# Patient Record
Sex: Female | Born: 1935 | Race: White | Hispanic: No | Marital: Married | Smoking: Never smoker
Health system: Southern US, Community
[De-identification: ages and names within clinical notes are randomized; demographics above are authoritative.]

## PROBLEM LIST (undated history)

## (undated) DIAGNOSIS — I82409 Acute embolism and thrombosis of unspecified deep veins of unspecified lower extremity: Secondary | ICD-10-CM

## (undated) DIAGNOSIS — E7800 Pure hypercholesterolemia, unspecified: Secondary | ICD-10-CM

## (undated) DIAGNOSIS — F329 Major depressive disorder, single episode, unspecified: Secondary | ICD-10-CM

## (undated) DIAGNOSIS — C439 Malignant melanoma of skin, unspecified: Secondary | ICD-10-CM

## (undated) DIAGNOSIS — C50919 Malignant neoplasm of unspecified site of unspecified female breast: Secondary | ICD-10-CM

## (undated) DIAGNOSIS — M4800 Spinal stenosis, site unspecified: Secondary | ICD-10-CM

## (undated) DIAGNOSIS — I10 Essential (primary) hypertension: Secondary | ICD-10-CM

## (undated) DIAGNOSIS — T4145XA Adverse effect of unspecified anesthetic, initial encounter: Secondary | ICD-10-CM

## (undated) DIAGNOSIS — M810 Age-related osteoporosis without current pathological fracture: Secondary | ICD-10-CM

## (undated) DIAGNOSIS — F1021 Alcohol dependence, in remission: Secondary | ICD-10-CM

## (undated) DIAGNOSIS — I739 Peripheral vascular disease, unspecified: Secondary | ICD-10-CM

## (undated) DIAGNOSIS — M1990 Unspecified osteoarthritis, unspecified site: Secondary | ICD-10-CM

## (undated) DIAGNOSIS — C4492 Squamous cell carcinoma of skin, unspecified: Secondary | ICD-10-CM

## (undated) DIAGNOSIS — Z923 Personal history of irradiation: Secondary | ICD-10-CM

## (undated) HISTORY — PX: VEIN LIGATION: SHX2652

## (undated) HISTORY — PX: TONSILLECTOMY AND ADENOIDECTOMY: SUR1326

## (undated) HISTORY — DX: Alcohol dependence, in remission: F10.21

## (undated) HISTORY — PX: KYPHOPLASTY: SHX5884

## (undated) HISTORY — PX: OTHER SURGICAL HISTORY: SHX169

---
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
## 1979-03-26 HISTORY — PX: POSTERIOR LAMINECTOMY / DECOMPRESSION LUMBAR SPINE: SUR740

## 1991-07-26 DIAGNOSIS — Z923 Personal history of irradiation: Secondary | ICD-10-CM

## 1991-07-26 HISTORY — DX: Personal history of irradiation: Z92.3

## 1994-07-25 DIAGNOSIS — C50919 Malignant neoplasm of unspecified site of unspecified female breast: Secondary | ICD-10-CM

## 1994-07-25 HISTORY — PX: BREAST LUMPECTOMY: SHX2

## 1994-07-25 HISTORY — DX: Malignant neoplasm of unspecified site of unspecified female breast: C50.919

## 1998-09-15 ENCOUNTER — Ambulatory Visit (HOSPITAL_COMMUNITY): Admission: RE | Admit: 1998-09-15 | Discharge: 1998-09-15 | Payer: Self-pay | Admitting: Internal Medicine

## 1999-12-03 ENCOUNTER — Other Ambulatory Visit: Admission: RE | Admit: 1999-12-03 | Discharge: 1999-12-03 | Payer: Self-pay | Admitting: Internal Medicine

## 2000-12-06 ENCOUNTER — Other Ambulatory Visit: Admission: RE | Admit: 2000-12-06 | Discharge: 2000-12-06 | Payer: Self-pay | Admitting: Internal Medicine

## 2001-09-30 ENCOUNTER — Emergency Department (HOSPITAL_COMMUNITY): Admission: EM | Admit: 2001-09-30 | Discharge: 2001-09-30 | Payer: Self-pay | Admitting: Emergency Medicine

## 2001-09-30 ENCOUNTER — Encounter: Payer: Self-pay | Admitting: Emergency Medicine

## 2001-10-05 ENCOUNTER — Emergency Department (HOSPITAL_COMMUNITY): Admission: EM | Admit: 2001-10-05 | Discharge: 2001-10-05 | Payer: Self-pay | Admitting: Emergency Medicine

## 2002-08-22 ENCOUNTER — Ambulatory Visit (HOSPITAL_COMMUNITY): Admission: RE | Admit: 2002-08-22 | Discharge: 2002-08-22 | Payer: Self-pay | Admitting: Internal Medicine

## 2003-01-06 ENCOUNTER — Encounter: Admission: RE | Admit: 2003-01-06 | Discharge: 2003-01-06 | Payer: Self-pay | Admitting: Surgery

## 2003-01-06 ENCOUNTER — Encounter: Payer: Self-pay | Admitting: Surgery

## 2004-01-13 ENCOUNTER — Encounter: Admission: RE | Admit: 2004-01-13 | Discharge: 2004-01-13 | Payer: Self-pay | Admitting: Surgery

## 2004-01-15 DIAGNOSIS — C4492 Squamous cell carcinoma of skin, unspecified: Secondary | ICD-10-CM

## 2004-01-15 DIAGNOSIS — C439 Malignant melanoma of skin, unspecified: Secondary | ICD-10-CM

## 2004-01-15 HISTORY — DX: Squamous cell carcinoma of skin, unspecified: C44.92

## 2004-01-15 HISTORY — DX: Malignant melanoma of skin, unspecified: C43.9

## 2005-01-27 ENCOUNTER — Encounter: Admission: RE | Admit: 2005-01-27 | Discharge: 2005-01-27 | Payer: Self-pay | Admitting: Surgery

## 2005-07-15 ENCOUNTER — Inpatient Hospital Stay (HOSPITAL_COMMUNITY): Admission: EM | Admit: 2005-07-15 | Discharge: 2005-07-16 | Payer: Self-pay | Admitting: Emergency Medicine

## 2006-02-07 ENCOUNTER — Encounter: Admission: RE | Admit: 2006-02-07 | Discharge: 2006-02-07 | Payer: Self-pay | Admitting: Surgery

## 2007-02-13 ENCOUNTER — Encounter: Admission: RE | Admit: 2007-02-13 | Discharge: 2007-02-13 | Payer: Self-pay | Admitting: Surgery

## 2007-09-25 ENCOUNTER — Ambulatory Visit: Payer: Self-pay | Admitting: *Deleted

## 2007-10-17 ENCOUNTER — Ambulatory Visit: Payer: Self-pay | Admitting: Internal Medicine

## 2007-12-18 ENCOUNTER — Ambulatory Visit: Payer: Self-pay | Admitting: Internal Medicine

## 2008-01-01 ENCOUNTER — Ambulatory Visit: Payer: Self-pay | Admitting: Internal Medicine

## 2008-02-14 ENCOUNTER — Encounter: Admission: RE | Admit: 2008-02-14 | Discharge: 2008-02-14 | Payer: Self-pay | Admitting: Internal Medicine

## 2008-04-03 ENCOUNTER — Ambulatory Visit (HOSPITAL_COMMUNITY): Admission: RE | Admit: 2008-04-03 | Discharge: 2008-04-03 | Payer: Self-pay | Admitting: Orthopedic Surgery

## 2008-04-10 ENCOUNTER — Ambulatory Visit (HOSPITAL_COMMUNITY): Admission: RE | Admit: 2008-04-10 | Discharge: 2008-04-10 | Payer: Self-pay | Admitting: Orthopedic Surgery

## 2009-02-23 ENCOUNTER — Encounter: Admission: RE | Admit: 2009-02-23 | Discharge: 2009-02-23 | Payer: Self-pay | Admitting: Internal Medicine

## 2010-03-10 ENCOUNTER — Encounter: Admission: RE | Admit: 2010-03-10 | Discharge: 2010-03-10 | Payer: Self-pay | Admitting: Internal Medicine

## 2010-07-20 ENCOUNTER — Inpatient Hospital Stay (HOSPITAL_COMMUNITY)
Admission: EM | Admit: 2010-07-20 | Discharge: 2010-07-22 | Payer: Self-pay | Source: Home / Self Care | Attending: Internal Medicine | Admitting: Internal Medicine

## 2010-08-15 ENCOUNTER — Encounter: Payer: Self-pay | Admitting: Internal Medicine

## 2010-10-04 LAB — CBC
Hemoglobin: 10.4 g/dL — ABNORMAL LOW (ref 12.0–15.0)
MCH: 31.5 pg (ref 26.0–34.0)
MCHC: 33.7 g/dL (ref 30.0–36.0)
MCV: 94 fL (ref 78.0–100.0)
MCV: 94.4 fL (ref 78.0–100.0)
Platelets: 160 10*3/uL (ref 150–400)
Platelets: 169 10*3/uL (ref 150–400)
Platelets: 201 10*3/uL (ref 150–400)
RBC: 4.33 MIL/uL (ref 3.87–5.11)
RDW: 13.4 % (ref 11.5–15.5)
RDW: 13.4 % (ref 11.5–15.5)
WBC: 11.7 10*3/uL — ABNORMAL HIGH (ref 4.0–10.5)
WBC: 6.3 10*3/uL (ref 4.0–10.5)

## 2010-10-04 LAB — TYPE AND SCREEN
ABO/RH(D): A POS
Antibody Screen: NEGATIVE

## 2010-10-04 LAB — BASIC METABOLIC PANEL
BUN: 20 mg/dL (ref 6–23)
CO2: 30 mEq/L (ref 19–32)
Calcium: 8.4 mg/dL (ref 8.4–10.5)
Chloride: 102 mEq/L (ref 96–112)
Creatinine, Ser: 0.59 mg/dL (ref 0.4–1.2)
Creatinine, Ser: 0.66 mg/dL (ref 0.4–1.2)
GFR calc Af Amer: >60 mL/min (ref >60)
Glucose, Bld: 152 mg/dL — ABNORMAL HIGH (ref 70–99)

## 2010-10-04 LAB — DIFFERENTIAL
Lymphocytes Relative: 15 % (ref 12–46)
Lymphs Abs: 1.7 10*3/uL (ref 0.7–4.0)
Neutrophils Relative %: 81 % — ABNORMAL HIGH (ref 43–77)

## 2010-10-04 LAB — URINALYSIS, ROUTINE W REFLEX MICROSCOPIC
Bilirubin Urine: NEGATIVE
Hgb urine dipstick: NEGATIVE
Ketones, ur: NEGATIVE mg/dL
Protein, ur: NEGATIVE mg/dL
Urobilinogen, UA: 0.2 mg/dL (ref 0.0–1.0)

## 2010-10-04 LAB — APTT: aPTT: 28 seconds (ref 24–37)

## 2010-10-04 LAB — PROTIME-INR
INR: 0.93 (ref 0.00–1.49)
Prothrombin Time: 12.7 seconds (ref 11.6–15.2)
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
## 2010-12-07 NOTE — Procedures (Signed)
DUPLEX DEEP VENOUS EXAM - LOWER EXTREMITY   INDICATION:  Right leg swelling and redness.   HISTORY:  Edema:  Right.  Trauma/Surgery:  Patient states that she bumped her right leg  approximately one month ago.  Pain:  Reddened and painful on the right.  PE:  No.  Previous DVT:  No.  Anticoagulants:  No.  Other:   DUPLEX EXAM:                CFV   SFV   PopV  PTV    GSV                R  L  R  L  R  L  R   L  R  L  Thrombosis    o  o  o     o     o  Spontaneous   +  +  +     +     +  Phasic        +  +  +           +  Augmentation  +  +  +     +     +  Compressible  +  +  +     +     +  Competent     +  +  +     +     +   Legend:  + - yes  o - no  p - partial  D - decreased   IMPRESSION:  1. Right posterior tibial artery signal is within normal limits.  2. No evidence of right lower extremity deep venous thrombosis.  3. Right short saphenous vein is patent.   A preliminary copy was faxed to Dr. Lanell Matar office.    _____________________________  P. Liliane Bade, M.D.   DP/MEDQ  D:  09/25/2007  T:  09/25/2007  Job:  [id number]

## 2010-12-07 NOTE — Consult Note (Signed)
NAME:  [name], [name]                 ACCOUNT NO.:  [id number]   MEDICAL RECORD NO.:  [id number]          PATIENT TYPE:  OUT   LOCATION:  MRI                          FACILITY:  [facility]   PHYSICIAN:  Dr. Corliss Skains          DATE OF BIRTH:  Mar 28, 1932   DATE OF CONSULTATION:  DATE OF DISCHARGE:                                 CONSULTATION   ADDENDUM   Due to the severity of the patient's fracture and retropulsion  associated with this injury, Dr. Corliss Skains has recommended a  vertebroplasty instead of a kyphoplasty.  His concern is that the  kyphoplasty procedure might cause further retropulsion of the fracture.  This was explained to the patient and her husband.      Delton See, P.A.    ______________________________  Dr. Corliss Skains    DR/MEDQ  D:  04/03/2008  T:  04/04/2008  Job:  [id number]   cc:   Mila Homer. Sherlean Foot, M.D.  Geoffry Paradise, MD
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
## 2010-12-22 ENCOUNTER — Other Ambulatory Visit (HOSPITAL_COMMUNITY): Payer: Self-pay | Admitting: Interventional Radiology

## 2010-12-22 DIAGNOSIS — M549 Dorsalgia, unspecified: Secondary | ICD-10-CM

## 2010-12-24 ENCOUNTER — Ambulatory Visit (HOSPITAL_COMMUNITY)
Admission: RE | Admit: 2010-12-24 | Discharge: 2010-12-24 | Disposition: A | Discharge status: Home / Self Care | Payer: Medicare Other | Source: Ambulatory Visit | Attending: Interventional Radiology | Admitting: Interventional Radiology

## 2010-12-24 DIAGNOSIS — M549 Dorsalgia, unspecified: Secondary | ICD-10-CM

## 2011-01-18 ENCOUNTER — Emergency Department (HOSPITAL_BASED_OUTPATIENT_CLINIC_OR_DEPARTMENT_OTHER)
Admission: EM | Admit: 2011-01-18 | Discharge: 2011-01-18 | Disposition: A | Discharge status: Home / Self Care | Payer: Medicare Other | Attending: Emergency Medicine | Admitting: Emergency Medicine

## 2011-01-18 ENCOUNTER — Emergency Department (INDEPENDENT_AMBULATORY_CARE_PROVIDER_SITE_OTHER): Payer: Medicare Other

## 2011-01-18 DIAGNOSIS — G319 Degenerative disease of nervous system, unspecified: Secondary | ICD-10-CM

## 2011-01-18 DIAGNOSIS — R51 Headache: Secondary | ICD-10-CM

## 2011-01-18 DIAGNOSIS — E7800 Pure hypercholesterolemia, unspecified: Secondary | ICD-10-CM | POA: Insufficient documentation

## 2011-01-18 DIAGNOSIS — M542 Cervicalgia: Secondary | ICD-10-CM

## 2011-01-18 DIAGNOSIS — Z853 Personal history of malignant neoplasm of breast: Secondary | ICD-10-CM | POA: Insufficient documentation

## 2011-01-18 DIAGNOSIS — Y92009 Unspecified place in unspecified non-institutional (private) residence as the place of occurrence of the external cause: Secondary | ICD-10-CM | POA: Insufficient documentation

## 2011-01-18 DIAGNOSIS — I10 Essential (primary) hypertension: Secondary | ICD-10-CM | POA: Insufficient documentation

## 2011-01-18 DIAGNOSIS — M5030 Other cervical disc degeneration, unspecified cervical region: Secondary | ICD-10-CM

## 2011-01-18 DIAGNOSIS — S0100XA Unspecified open wound of scalp, initial encounter: Secondary | ICD-10-CM | POA: Insufficient documentation

## 2011-01-18 DIAGNOSIS — W19XXXA Unspecified fall, initial encounter: Secondary | ICD-10-CM | POA: Insufficient documentation

## 2011-01-18 DIAGNOSIS — M47812 Spondylosis without myelopathy or radiculopathy, cervical region: Secondary | ICD-10-CM

## 2011-01-18 DIAGNOSIS — F1021 Alcohol dependence, in remission: Secondary | ICD-10-CM | POA: Insufficient documentation

## 2011-01-27 ENCOUNTER — Ambulatory Visit (HOSPITAL_COMMUNITY)
Admission: RE | Admit: 2011-01-27 | Discharge: 2011-01-27 | Disposition: A | Discharge status: Home / Self Care | Payer: Medicare Other | Source: Ambulatory Visit | Attending: Interventional Radiology | Admitting: Interventional Radiology

## 2011-01-27 ENCOUNTER — Other Ambulatory Visit (HOSPITAL_COMMUNITY): Payer: Self-pay | Admitting: Interventional Radiology

## 2011-01-27 DIAGNOSIS — M549 Dorsalgia, unspecified: Secondary | ICD-10-CM

## 2011-01-27 DIAGNOSIS — M4804 Spinal stenosis, thoracic region: Secondary | ICD-10-CM | POA: Insufficient documentation

## 2011-01-27 DIAGNOSIS — M5126 Other intervertebral disc displacement, lumbar region: Secondary | ICD-10-CM | POA: Insufficient documentation

## 2011-01-27 DIAGNOSIS — M545 Low back pain, unspecified: Secondary | ICD-10-CM | POA: Insufficient documentation

## 2011-01-27 DIAGNOSIS — M51379 Other intervertebral disc degeneration, lumbosacral region without mention of lumbar back pain or lower extremity pain: Secondary | ICD-10-CM | POA: Insufficient documentation

## 2011-01-27 DIAGNOSIS — M519 Unspecified thoracic, thoracolumbar and lumbosacral intervertebral disc disorder: Secondary | ICD-10-CM | POA: Insufficient documentation

## 2011-01-27 DIAGNOSIS — M5137 Other intervertebral disc degeneration, lumbosacral region: Secondary | ICD-10-CM | POA: Insufficient documentation

## 2011-01-27 DIAGNOSIS — M8448XA Pathological fracture, other site, initial encounter for fracture: Secondary | ICD-10-CM | POA: Insufficient documentation

## 2011-01-31 ENCOUNTER — Other Ambulatory Visit (HOSPITAL_COMMUNITY): Payer: Self-pay | Admitting: Interventional Radiology

## 2011-01-31 DIAGNOSIS — IMO0002 Reserved for concepts with insufficient information to code with codable children: Secondary | ICD-10-CM

## 2011-02-01 ENCOUNTER — Ambulatory Visit (HOSPITAL_COMMUNITY)
Admission: RE | Admit: 2011-02-01 | Discharge: 2011-02-01 | Disposition: A | Discharge status: Home / Self Care | Payer: Medicare Other | Source: Ambulatory Visit | Attending: Interventional Radiology | Admitting: Interventional Radiology

## 2011-02-01 ENCOUNTER — Other Ambulatory Visit (HOSPITAL_COMMUNITY): Payer: Self-pay | Admitting: Interventional Radiology

## 2011-02-01 ENCOUNTER — Other Ambulatory Visit: Payer: Self-pay | Admitting: Interventional Radiology

## 2011-02-01 DIAGNOSIS — IMO0002 Reserved for concepts with insufficient information to code with codable children: Secondary | ICD-10-CM

## 2011-02-01 DIAGNOSIS — M545 Low back pain, unspecified: Secondary | ICD-10-CM | POA: Insufficient documentation

## 2011-02-01 DIAGNOSIS — M8448XA Pathological fracture, other site, initial encounter for fracture: Secondary | ICD-10-CM | POA: Insufficient documentation

## 2011-02-01 LAB — BASIC METABOLIC PANEL
CO2: 28 mEq/L (ref 19–32)
Glucose, Bld: 101 mg/dL — ABNORMAL HIGH (ref 70–99)
Potassium: 3.7 mEq/L (ref 3.5–5.1)
Sodium: 140 mEq/L (ref 135–145)

## 2011-02-01 LAB — CBC
Hemoglobin: 13.7 g/dL (ref 12.0–15.0)
MCH: 31.4 pg (ref 26.0–34.0)
RBC: 4.37 MIL/uL (ref 3.87–5.11)

## 2011-02-01 LAB — PROTIME-INR: INR: 0.98 (ref 0.00–1.49)

## 2011-02-01 LAB — APTT: aPTT: 30 seconds (ref 24–37)

## 2011-02-01 LAB — POCT I-STAT 4, (NA,K, GLUC, HGB,HCT): Glucose, Bld: 104 mg/dL — ABNORMAL HIGH (ref 70–99)

## 2011-02-08 ENCOUNTER — Other Ambulatory Visit (HOSPITAL_COMMUNITY): Payer: Self-pay | Admitting: Interventional Radiology

## 2011-02-08 DIAGNOSIS — IMO0002 Reserved for concepts with insufficient information to code with codable children: Secondary | ICD-10-CM

## 2011-02-10 ENCOUNTER — Other Ambulatory Visit: Payer: Self-pay | Admitting: Internal Medicine

## 2011-02-10 DIAGNOSIS — Z853 Personal history of malignant neoplasm of breast: Secondary | ICD-10-CM

## 2011-02-15 ENCOUNTER — Ambulatory Visit (HOSPITAL_COMMUNITY)
Admission: RE | Admit: 2011-02-15 | Discharge: 2011-02-15 | Disposition: A | Discharge status: Home / Self Care | Payer: Medicare Other | Source: Ambulatory Visit | Attending: Interventional Radiology | Admitting: Interventional Radiology

## 2011-02-15 DIAGNOSIS — IMO0002 Reserved for concepts with insufficient information to code with codable children: Secondary | ICD-10-CM

## 2011-03-24 ENCOUNTER — Other Ambulatory Visit: Payer: Self-pay | Admitting: Internal Medicine

## 2011-03-24 ENCOUNTER — Ambulatory Visit
Admission: RE | Admit: 2011-03-24 | Discharge: 2011-03-24 | Disposition: A | Discharge status: Home / Self Care | Payer: Medicare Other | Source: Ambulatory Visit | Attending: Internal Medicine | Admitting: Internal Medicine

## 2011-03-24 DIAGNOSIS — Z853 Personal history of malignant neoplasm of breast: Secondary | ICD-10-CM

## 2011-04-25 LAB — PROTIME-INR
INR: 1
Prothrombin Time: 13.5

## 2011-04-25 LAB — CBC
HCT: 40.5
Hemoglobin: 13.4
MCHC: 33.1
RDW: 13

## 2011-04-25 LAB — BASIC METABOLIC PANEL
CO2: 31
Glucose, Bld: 89
Potassium: 3.5
Sodium: 138

## 2011-04-27 LAB — CREATININE, SERUM
Creatinine, Ser: 0.71
GFR calc non Af Amer: >60

## 2011-04-27 LAB — BUN: BUN: 21

## 2011-06-17 ENCOUNTER — Emergency Department (HOSPITAL_BASED_OUTPATIENT_CLINIC_OR_DEPARTMENT_OTHER)
Admission: EM | Admit: 2011-06-17 | Discharge: 2011-06-17 | Disposition: A | Discharge status: Home / Self Care | Payer: Medicare Other | Attending: Emergency Medicine | Admitting: Emergency Medicine

## 2011-06-17 ENCOUNTER — Emergency Department (INDEPENDENT_AMBULATORY_CARE_PROVIDER_SITE_OTHER): Payer: Medicare Other

## 2011-06-17 DIAGNOSIS — W19XXXA Unspecified fall, initial encounter: Secondary | ICD-10-CM

## 2011-06-17 DIAGNOSIS — M1120 Other chondrocalcinosis, unspecified site: Secondary | ICD-10-CM

## 2011-06-17 DIAGNOSIS — S8001XA Contusion of right knee, initial encounter: Secondary | ICD-10-CM

## 2011-06-17 DIAGNOSIS — E7800 Pure hypercholesterolemia, unspecified: Secondary | ICD-10-CM | POA: Insufficient documentation

## 2011-06-17 DIAGNOSIS — Z79899 Other long term (current) drug therapy: Secondary | ICD-10-CM | POA: Insufficient documentation

## 2011-06-17 DIAGNOSIS — Z853 Personal history of malignant neoplasm of breast: Secondary | ICD-10-CM | POA: Insufficient documentation

## 2011-06-17 DIAGNOSIS — I10 Essential (primary) hypertension: Secondary | ICD-10-CM | POA: Insufficient documentation

## 2011-06-17 DIAGNOSIS — M79609 Pain in unspecified limb: Secondary | ICD-10-CM

## 2011-06-17 DIAGNOSIS — S8000XA Contusion of unspecified knee, initial encounter: Secondary | ICD-10-CM | POA: Insufficient documentation

## 2011-06-17 HISTORY — DX: Essential (primary) hypertension: I10

## 2011-06-17 HISTORY — DX: Pure hypercholesterolemia, unspecified: E78.00

## 2011-06-17 HISTORY — DX: Malignant neoplasm of unspecified site of unspecified female breast: C50.919

## 2011-06-17 MED ORDER — IBUPROFEN 800 MG PO TABS
800.0000 mg | ORAL_TABLET | Freq: Once | ORAL | Status: AC
  Administered 2011-06-17: 800 mg via ORAL
  Filled 2011-06-17: qty 1

## 2011-06-17 MED ORDER — NAPROXEN 500 MG PO TABS: 500.0000 mg | ORAL_TABLET | Freq: Two times a day (BID) | ORAL | Status: DC
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
## 2011-06-17 NOTE — ED Notes (Signed)
Fell approx 4pm today-pain to right knee

## 2011-10-12 ENCOUNTER — Ambulatory Visit (INDEPENDENT_AMBULATORY_CARE_PROVIDER_SITE_OTHER): Payer: Medicare Other | Admitting: *Deleted

## 2011-10-12 DIAGNOSIS — M79609 Pain in unspecified limb: Secondary | ICD-10-CM

## 2011-10-12 DIAGNOSIS — M7989 Other specified soft tissue disorders: Secondary | ICD-10-CM

## 2011-10-19 NOTE — Procedures (Unsigned)
DUPLEX DEEP VENOUS EXAM - LOWER EXTREMITY  INDICATION:  Right lower extremity pain and swelling.  Bilateral vein ligation 40 years ago.  HISTORY:  Edema:  Yes. Trauma/Surgery:  No. Pain:  Yes. PE:  No. Previous DVT:  No. Anticoagulants:  No. Other:  Hemosiderin deposits of the right ankle.  DUPLEX EXAM:               CFV   SFV   PopV  PTV    GSV               R  L  R  L  R  L  R   L  R   L Thrombosis    0     0     0     0      NV. Spontaneous   +     +     +     + Phasic        +     +     +     + Augmentation  +     +     +     + Compressible  +     +     +     + Competent     0  0  0     +     0  Legend:  + - yes  o - no  p - partial  D - decreased  IMPRESSION:  No evidence of DVT in the right lower extremity.  Of note, the great saphenous vein has been surgically ligated, however, there are multiple varicose branches observed throughout the leg including prominent perforators of the ankle.  The right small saphenous vein is within normal limits.  There is deep venous reflux observed.   _____________________________ Quita Skye. Hart Rochester, M.D.  LT/MEDQ  D:  10/12/2011  T:  10/12/2011  Job:  [id number]

## 2012-04-10 ENCOUNTER — Other Ambulatory Visit: Payer: Self-pay | Admitting: Internal Medicine

## 2012-04-10 DIAGNOSIS — Z1231 Encounter for screening mammogram for malignant neoplasm of breast: Secondary | ICD-10-CM

## 2012-04-11 HISTORY — PX: MENISECTOMY: SHX5181

## 2012-04-14 ENCOUNTER — Emergency Department (HOSPITAL_COMMUNITY)
Admission: EM | Admit: 2012-04-14 | Discharge: 2012-04-14 | Disposition: A | Discharge status: Home / Self Care | Payer: Medicare Other | Attending: Emergency Medicine | Admitting: Emergency Medicine

## 2012-04-14 ENCOUNTER — Emergency Department (HOSPITAL_COMMUNITY): Payer: Medicare Other

## 2012-04-14 ENCOUNTER — Encounter (HOSPITAL_COMMUNITY): Payer: Self-pay | Admitting: *Deleted

## 2012-04-14 DIAGNOSIS — Z79899 Other long term (current) drug therapy: Secondary | ICD-10-CM | POA: Insufficient documentation

## 2012-04-14 DIAGNOSIS — I10 Essential (primary) hypertension: Secondary | ICD-10-CM | POA: Insufficient documentation

## 2012-04-14 DIAGNOSIS — M25569 Pain in unspecified knee: Secondary | ICD-10-CM | POA: Insufficient documentation

## 2012-04-14 DIAGNOSIS — M7989 Other specified soft tissue disorders: Secondary | ICD-10-CM

## 2012-04-14 DIAGNOSIS — M25561 Pain in right knee: Secondary | ICD-10-CM

## 2012-04-14 DIAGNOSIS — M79609 Pain in unspecified limb: Secondary | ICD-10-CM

## 2012-04-14 LAB — CBC WITH DIFFERENTIAL/PLATELET
Basophils Absolute: 0 10*3/uL (ref 0.0–0.1)
Basophils Relative: 0 % (ref 0–1)
Lymphocytes Relative: 8 % — ABNORMAL LOW (ref 12–46)
MCHC: 35 g/dL (ref 30.0–36.0)
Neutro Abs: 11.5 10*3/uL — ABNORMAL HIGH (ref 1.7–7.7)
Neutrophils Relative %: 83 % — ABNORMAL HIGH (ref 43–77)
RDW: 13.1 % (ref 11.5–15.5)
WBC: 13.9 10*3/uL — ABNORMAL HIGH (ref 4.0–10.5)

## 2012-04-14 LAB — BASIC METABOLIC PANEL
Chloride: 97 mEq/L (ref 96–112)
Creatinine, Ser: 0.71 mg/dL (ref 0.50–1.10)
GFR calc Af Amer: >90 mL/min (ref >90)
Potassium: 4 mEq/L (ref 3.5–5.1)
Sodium: 137 mEq/L (ref 135–145)

## 2012-04-14 MED ORDER — HYDROMORPHONE HCL PF 1 MG/ML IJ SOLN
0.5000 mg | Freq: Once | INTRAMUSCULAR | Status: AC
  Administered 2012-04-14: 0.5 mg via INTRAVENOUS
  Filled 2012-04-14 (×2): qty 1

## 2012-04-14 MED ORDER — HYDROMORPHONE HCL PF 1 MG/ML IJ SOLN
0.5000 mg | Freq: Once | INTRAMUSCULAR | Status: AC
  Administered 2012-04-14: 0.5 mg via INTRAMUSCULAR

## 2012-04-14 MED ORDER — ONDANSETRON HCL 4 MG/2ML IJ SOLN
4.0000 mg | Freq: Once | INTRAMUSCULAR | Status: AC
  Administered 2012-04-14: 4 mg via INTRAVENOUS
  Filled 2012-04-14: qty 2

## 2012-04-14 MED ORDER — SODIUM CHLORIDE 0.9 % IV BOLUS (SEPSIS)
500.0000 mL | Freq: Once | INTRAVENOUS | Status: AC
  Administered 2012-04-14: 500 mL via INTRAVENOUS

## 2012-04-14 MED ORDER — HYDROMORPHONE HCL PF 1 MG/ML IJ SOLN
1.0000 mg | Freq: Once | INTRAMUSCULAR | Status: AC
  Administered 2012-04-14: 1 mg via INTRAVENOUS
  Filled 2012-04-14: qty 1

## 2012-04-14 NOTE — ED Notes (Signed)
Pt. Is a recovering etoh; pt. Been taking Vicodin but afraid to overdose; sleeping through night. Today after lunch, she experienced severe pain from the back of leg to upper thigh. Called dr. Everlene Other of the pain. Pt. Been re-dressing the rt. Leg.? Dvt. Concerned about a blood clot.

## 2012-04-14 NOTE — ED Notes (Signed)
Pt for discharge.Vital signs stable.GCS 15.Pt still complaining of pain.

## 2012-04-14 NOTE — ED Notes (Signed)
Rt. Leg knee pain. Swollen in knee. Had surgery on pas wed. - ?.

## 2012-04-14 NOTE — ED Provider Notes (Signed)
History     CSN: [id number]  Arrival date & time 04/14/12  1647   First MD Initiated Contact with Patient 04/14/12 1805      Chief Complaint  Patient presents with  . Joint Swelling    (Consider location/radiation/quality/duration/timing/severity/associated sxs/prior treatment) HPI.... right knee pain tonight.  Status post right knee surgery past Wednesday by Dr. Dannielle Huh.   No fever, sweats, chills. Patient did walk and stand on her leg today.  Severity is moderate. Movement makes symptoms worse  Past Medical History  Diagnosis Date  . Breast cancer   . Hypertension   . High cholesterol   . Osteopetrosis     Past Surgical History  Procedure Date  . Breast lumpectomy     No family history on file.  History  Substance Use Topics  . Smoking status: Not on file  . Smokeless tobacco: Not on file  . Alcohol Use: No    OB History    Grav Para Term Preterm Abortions TAB SAB Ect Mult Living                  Review of Systems  All other systems reviewed and are negative.    Allergies  Codeine and Oysters  Home Medications   Current Outpatient Rx  Name Route Sig Dispense Refill  . CELECOXIB 200 MG PO CAPS Oral Take 200 mg by mouth 2 (two) times daily.    Marland Kitchen VITAMIN D3 2000 UNITS PO TABS Oral Take 2 tablets by mouth every morning.    Marland Kitchen ESCITALOPRAM OXALATE 10 MG PO TABS Oral Take 10 mg by mouth every morning.     Marland Kitchen ONE-DAILY MULTI VITAMINS PO TABS Oral Take 1 tablet by mouth every morning.     Marland Kitchen SIMVASTATIN 20 MG PO TABS Oral Take 20 mg by mouth every morning.     . TERIPARATIDE (RECOMBINANT) 600 MCG/2.4ML [place] SOLN Subcutaneous Inject 20 mcg into the skin daily.    . TRIAMTERENE-HCTZ 37.5-25 MG PO TABS Oral Take 1 tablet by mouth every morning.       BP 122/65  Pulse 79  Resp 16  SpO2 95%  Physical Exam  Nursing note and vitals reviewed. Constitutional: She is oriented to person, place, and time. She appears well-developed and well-nourished.  HENT:    Head: Normocephalic and atraumatic.  Eyes: Conjunctivae normal are normal.  Neck: Normal range of motion.  Abdominal: Soft. Bowel sounds are normal.  Musculoskeletal:       Right knee:  No pain with range of motion. Slight distal quadriceps edema.  No obvious joint effusion  Neurological: She is alert and oriented to person, place, and time.  Skin: Skin is warm and dry.  Psychiatric: She has a normal mood and affect.    ED Course  Procedures (including critical care time)  Labs Reviewed  CBC WITH DIFFERENTIAL - Abnormal; Notable for the following:    WBC 13.9 (*)     Neutrophils Relative 83 (*)     Neutro Abs 11.5 (*)     Lymphocytes Relative 8 (*)     Monocytes Absolute 1.2 (*)     All other components within normal limits  BASIC METABOLIC PANEL - Abnormal; Notable for the following:    Glucose, Bld 117 (*)     BUN 28 (*)     Calcium 11.3 (*)     GFR calc non Af Amer 81 (*)     All other components within normal limits  Dg Knee Complete 4 Views Right  04/14/2012  *RADIOLOGY REPORT*  Clinical Data: Joint pain/swelling  RIGHT KNEE - COMPLETE 4+ VIEW  Comparison: MRI right knee dated 03/29/2012  Findings: No fracture or dislocation is seen.  Moderate tricompartmental degenerative changes with chondrocalcinosis.  Moderate suprapatellar knee joint effusion.  IMPRESSION: Moderate tricompartmental degenerative changes with chondrocalcinosis.  Moderate suprapatellar knee joint effusion.   Original Report Authenticated By: Charline Bills, M.D.      1. Right knee pain       MDM  Doppler study reveals no blood clot. Discussed case with Dr. Sherlean Foot.   Will discharge home. Patient has good pain management at discharge        Donnetta Hutching, MD 04/14/12 2339

## 2012-04-14 NOTE — Progress Notes (Signed)
VASCULAR LAB PRELIMINARY  PRELIMINARY  PRELIMINARY  PRELIMINARY  Right lower extremity venous Doppler completed.    Preliminary report:  There is no DVT or SVT noted in the right lower extremity.  There is an area of fluid noted in the popliteal fossa, may be consistent with a Baker's Cyst.  Villa Burgin, 04/14/2012, 7:49 PM

## 2012-04-17 ENCOUNTER — Encounter (HOSPITAL_COMMUNITY): Payer: Self-pay | Admitting: Critical Care Medicine

## 2012-04-17 ENCOUNTER — Encounter (HOSPITAL_COMMUNITY)
Admission: AD | Disposition: A | Discharge status: Skilled Nursing Facility | Payer: Self-pay | Source: Ambulatory Visit | Attending: Orthopedic Surgery

## 2012-04-17 ENCOUNTER — Inpatient Hospital Stay (HOSPITAL_COMMUNITY)
Admission: AD | Admit: 2012-04-17 | Discharge: 2012-04-23 | DRG: 496 | Disposition: A | Discharge status: Skilled Nursing Facility | Payer: Medicare Other | Source: Ambulatory Visit | Attending: Orthopedic Surgery | Admitting: Orthopedic Surgery

## 2012-04-17 ENCOUNTER — Inpatient Hospital Stay (HOSPITAL_COMMUNITY): Payer: Medicare Other

## 2012-04-17 ENCOUNTER — Inpatient Hospital Stay (HOSPITAL_COMMUNITY): Payer: Medicare Other | Admitting: Critical Care Medicine

## 2012-04-17 ENCOUNTER — Other Ambulatory Visit: Payer: Self-pay | Admitting: Orthopedic Surgery

## 2012-04-17 DIAGNOSIS — M009 Pyogenic arthritis, unspecified: Principal | ICD-10-CM | POA: Diagnosis present

## 2012-04-17 DIAGNOSIS — D62 Acute posthemorrhagic anemia: Secondary | ICD-10-CM | POA: Diagnosis present

## 2012-04-17 DIAGNOSIS — L089 Local infection of the skin and subcutaneous tissue, unspecified: Secondary | ICD-10-CM

## 2012-04-17 HISTORY — DX: Major depressive disorder, single episode, unspecified: F32.9

## 2012-04-17 HISTORY — PX: I & D EXTREMITY: SHX5045

## 2012-04-17 HISTORY — DX: Unspecified osteoarthritis, unspecified site: M19.90

## 2012-04-17 HISTORY — DX: Adverse effect of unspecified anesthetic, initial encounter: T41.45XA

## 2012-04-17 HISTORY — DX: Age-related osteoporosis without current pathological fracture: M81.0

## 2012-04-17 HISTORY — PX: KNEE ARTHROSCOPY: SHX127

## 2012-04-17 LAB — CBC WITH DIFFERENTIAL/PLATELET
Lymphocytes Relative: 6 % — ABNORMAL LOW (ref 12–46)
Lymphs Abs: 0.7 10*3/uL (ref 0.7–4.0)
Neutrophils Relative %: 83 % — ABNORMAL HIGH (ref 43–77)
Platelets: 169 10*3/uL (ref 150–400)
RBC: 3.93 MIL/uL (ref 3.87–5.11)
WBC: 12 10*3/uL — ABNORMAL HIGH (ref 4.0–10.5)

## 2012-04-17 LAB — COMPREHENSIVE METABOLIC PANEL
ALT: 29 U/L (ref 0–35)
Alkaline Phosphatase: 115 U/L (ref 39–117)
CO2: 29 mEq/L (ref 19–32)
GFR calc Af Amer: 77 mL/min — ABNORMAL LOW (ref >90)
GFR calc non Af Amer: 66 mL/min — ABNORMAL LOW (ref >90)
Glucose, Bld: 128 mg/dL — ABNORMAL HIGH (ref 70–99)
Potassium: 3.4 mEq/L — ABNORMAL LOW (ref 3.5–5.1)
Sodium: 135 mEq/L (ref 135–145)

## 2012-04-17 SURGERY — ARTHROSCOPY, KNEE
Anesthesia: General | Site: Knee | Laterality: Right | Wound class: Clean Contaminated

## 2012-04-17 MED ORDER — VANCOMYCIN HCL IN DEXTROSE 1-5 GM/200ML-% IV SOLN
INTRAVENOUS | Status: AC
  Filled 2012-04-17: qty 200

## 2012-04-17 MED ORDER — PHENOL 1.4 % MT LIQD: 1.0000 | OROMUCOSAL | Status: DC | PRN

## 2012-04-17 MED ORDER — TERIPARATIDE (RECOMBINANT) 600 MCG/2.4ML ~~LOC~~ SOLN
20.0000 ug | Freq: Every day | SUBCUTANEOUS | Status: DC
  Administered 2012-04-18 – 2012-04-23 (×3): 20 ug via SUBCUTANEOUS

## 2012-04-17 MED ORDER — ACETAMINOPHEN 10 MG/ML IV SOLN
INTRAVENOUS | Status: AC
  Filled 2012-04-17: qty 100

## 2012-04-17 MED ORDER — HYDROCODONE-ACETAMINOPHEN 5-325 MG PO TABS
1.0000 | ORAL_TABLET | ORAL | Status: DC | PRN
  Administered 2012-04-17 – 2012-04-20 (×8): 2 via ORAL
  Administered 2012-04-20: 1 via ORAL
  Administered 2012-04-21 (×3): 2 via ORAL
  Filled 2012-04-17 (×12): qty 2

## 2012-04-17 MED ORDER — SODIUM CHLORIDE 0.9 % IV SOLN: INTRAVENOUS | Status: DC

## 2012-04-17 MED ORDER — VANCOMYCIN HCL IN DEXTROSE 1-5 GM/200ML-% IV SOLN
1000.0000 mg | Freq: Two times a day (BID) | INTRAVENOUS | Status: AC
  Administered 2012-04-17: 1000 mg via INTRAVENOUS
  Filled 2012-04-17: qty 200

## 2012-04-17 MED ORDER — BISACODYL 5 MG PO TBEC
5.0000 mg | DELAYED_RELEASE_TABLET | Freq: Every day | ORAL | Status: DC | PRN
  Administered 2012-04-20 – 2012-04-22 (×2): 5 mg via ORAL
  Filled 2012-04-17 (×2): qty 1

## 2012-04-17 MED ORDER — BUPIVACAINE-EPINEPHRINE 0.5% -1:200000 IJ SOLN
INTRAMUSCULAR | Status: DC | PRN
  Administered 2012-04-17: 30 mL

## 2012-04-17 MED ORDER — DOCUSATE SODIUM 100 MG PO CAPS
100.0000 mg | ORAL_CAPSULE | Freq: Two times a day (BID) | ORAL | Status: DC
  Administered 2012-04-17 – 2012-04-23 (×12): 100 mg via ORAL
  Filled 2012-04-17 (×13): qty 1

## 2012-04-17 MED ORDER — ACETAMINOPHEN 10 MG/ML IV SOLN
1000.0000 mg | Freq: Four times a day (QID) | INTRAVENOUS | Status: DC
  Administered 2012-04-17: 1000 mg via INTRAVENOUS
  Filled 2012-04-17 (×4): qty 100

## 2012-04-17 MED ORDER — FENTANYL CITRATE 0.05 MG/ML IJ SOLN
INTRAMUSCULAR | Status: DC | PRN
  Administered 2012-04-17 (×2): 50 ug via INTRAVENOUS
  Administered 2012-04-17: 25 ug via INTRAVENOUS

## 2012-04-17 MED ORDER — ACETAMINOPHEN 325 MG PO TABS
650.0000 mg | ORAL_TABLET | Freq: Four times a day (QID) | ORAL | Status: DC | PRN
  Administered 2012-04-18 – 2012-04-22 (×3): 650 mg via ORAL
  Filled 2012-04-17 (×3): qty 2

## 2012-04-17 MED ORDER — LIDOCAINE HCL (CARDIAC) 20 MG/ML IV SOLN
INTRAVENOUS | Status: DC | PRN
  Administered 2012-04-17: 50 mg via INTRAVENOUS

## 2012-04-17 MED ORDER — CELECOXIB 200 MG PO CAPS
200.0000 mg | ORAL_CAPSULE | Freq: Two times a day (BID) | ORAL | Status: DC
  Administered 2012-04-17 – 2012-04-23 (×11): 200 mg via ORAL
  Filled 2012-04-17 (×13): qty 1

## 2012-04-17 MED ORDER — FENTANYL CITRATE 0.05 MG/ML IJ SOLN: 25.0000 ug | INTRAMUSCULAR | Status: DC | PRN

## 2012-04-17 MED ORDER — ONDANSETRON HCL 4 MG PO TABS: 4.0000 mg | ORAL_TABLET | Freq: Four times a day (QID) | ORAL | Status: DC | PRN

## 2012-04-17 MED ORDER — ESCITALOPRAM OXALATE 10 MG PO TABS
10.0000 mg | ORAL_TABLET | Freq: Every morning | ORAL | Status: DC
  Administered 2012-04-18 – 2012-04-23 (×6): 10 mg via ORAL
  Filled 2012-04-17 (×6): qty 1

## 2012-04-17 MED ORDER — SENNOSIDES-DOCUSATE SODIUM 8.6-50 MG PO TABS
1.0000 | ORAL_TABLET | Freq: Every evening | ORAL | Status: DC | PRN
  Filled 2012-04-17 (×3): qty 1

## 2012-04-17 MED ORDER — DIPHENHYDRAMINE HCL 12.5 MG/5ML PO ELIX: 12.5000 mg | ORAL_SOLUTION | ORAL | Status: DC | PRN

## 2012-04-17 MED ORDER — METOCLOPRAMIDE HCL 5 MG/ML IJ SOLN
5.0000 mg | Freq: Three times a day (TID) | INTRAMUSCULAR | Status: DC | PRN
  Administered 2012-04-22: 10 mg via INTRAVENOUS
  Filled 2012-04-17: qty 2

## 2012-04-17 MED ORDER — ALUM & MAG HYDROXIDE-SIMETH 200-200-20 MG/5ML PO SUSP: 30.0000 mL | ORAL | Status: DC | PRN

## 2012-04-17 MED ORDER — METOCLOPRAMIDE HCL 10 MG PO TABS
5.0000 mg | ORAL_TABLET | Freq: Three times a day (TID) | ORAL | Status: DC | PRN
  Administered 2012-04-21: 10 mg via ORAL
  Filled 2012-04-17: qty 1

## 2012-04-17 MED ORDER — CHLORHEXIDINE GLUCONATE 4 % EX LIQD
60.0000 mL | Freq: Once | CUTANEOUS | Status: DC
Start: 2012-04-18 — End: 2012-04-17
  Filled 2012-04-17: qty 60

## 2012-04-17 MED ORDER — SIMVASTATIN 20 MG PO TABS
20.0000 mg | ORAL_TABLET | Freq: Every morning | ORAL | Status: DC
  Administered 2012-04-18 – 2012-04-23 (×6): 20 mg via ORAL
  Filled 2012-04-17 (×6): qty 1

## 2012-04-17 MED ORDER — ONDANSETRON HCL 4 MG/2ML IJ SOLN
INTRAMUSCULAR | Status: DC | PRN
  Administered 2012-04-17: 4 mg via INTRAVENOUS

## 2012-04-17 MED ORDER — ZOLPIDEM TARTRATE 5 MG PO TABS: 5.0000 mg | ORAL_TABLET | Freq: Every evening | ORAL | Status: DC | PRN

## 2012-04-17 MED ORDER — METHOCARBAMOL 500 MG PO TABS
500.0000 mg | ORAL_TABLET | Freq: Four times a day (QID) | ORAL | Status: DC | PRN
  Administered 2012-04-19 – 2012-04-22 (×7): 500 mg via ORAL
  Filled 2012-04-17 (×7): qty 1

## 2012-04-17 MED ORDER — TRIAMTERENE-HCTZ 75-50 MG PO TABS
1.0000 | ORAL_TABLET | Freq: Every day | ORAL | Status: DC
  Administered 2012-04-17 – 2012-04-23 (×5): 1 via ORAL
  Filled 2012-04-17 (×7): qty 1

## 2012-04-17 MED ORDER — MORPHINE SULFATE 4 MG/ML IJ SOLN
INTRAMUSCULAR | Status: AC
  Filled 2012-04-17: qty 1

## 2012-04-17 MED ORDER — MORPHINE SULFATE 2 MG/ML IJ SOLN
2.0000 mg | INTRAMUSCULAR | Status: DC | PRN
  Administered 2012-04-17 – 2012-04-18 (×2): 2 mg via INTRAVENOUS
  Filled 2012-04-17 (×2): qty 1

## 2012-04-17 MED ORDER — ACETAMINOPHEN 650 MG RE SUPP: 650.0000 mg | Freq: Four times a day (QID) | RECTAL | Status: DC | PRN

## 2012-04-17 MED ORDER — FLEET ENEMA 7-19 GM/118ML RE ENEM: 1.0000 | ENEMA | Freq: Once | RECTAL | Status: AC | PRN

## 2012-04-17 MED ORDER — VANCOMYCIN HCL 1000 MG IV SOLR
1000.0000 mg | INTRAVENOUS | Status: DC | PRN
  Administered 2012-04-17: 1000 mg via INTRAVENOUS

## 2012-04-17 MED ORDER — MORPHINE SULFATE 4 MG/ML IJ SOLN
INTRAMUSCULAR | Status: DC | PRN
  Administered 2012-04-17: 18:00:00 via INTRA_ARTICULAR

## 2012-04-17 MED ORDER — MENTHOL 3 MG MT LOZG: 1.0000 | LOZENGE | OROMUCOSAL | Status: DC | PRN

## 2012-04-17 MED ORDER — ASPIRIN EC 325 MG PO TBEC
325.0000 mg | DELAYED_RELEASE_TABLET | Freq: Every day | ORAL | Status: DC
  Administered 2012-04-18 – 2012-04-23 (×6): 325 mg via ORAL
  Filled 2012-04-17 (×7): qty 1

## 2012-04-17 MED ORDER — LACTATED RINGERS IV SOLN
INTRAVENOUS | Status: DC
  Administered 2012-04-17: 16:00:00 via INTRAVENOUS

## 2012-04-17 MED ORDER — PROPOFOL 10 MG/ML IV BOLUS
INTRAVENOUS | Status: DC | PRN
  Administered 2012-04-17: 20 mg via INTRAVENOUS
  Administered 2012-04-17: 30 mg via INTRAVENOUS
  Administered 2012-04-17: 150 mg via INTRAVENOUS

## 2012-04-17 MED ORDER — ONDANSETRON HCL 4 MG/2ML IJ SOLN: 4.0000 mg | Freq: Four times a day (QID) | INTRAMUSCULAR | Status: DC | PRN

## 2012-04-17 MED ORDER — DEXTROSE 5 % IV SOLN
500.0000 mg | Freq: Four times a day (QID) | INTRAVENOUS | Status: DC | PRN
  Filled 2012-04-17: qty 5

## 2012-04-17 SURGICAL SUPPLY — 70 items
BAG URINE DRAINAGE (UROLOGICAL SUPPLIES) ×2 IMPLANT
BANDAGE ELASTIC 4 VELCRO ST LF (GAUZE/BANDAGES/DRESSINGS) ×2 IMPLANT
BANDAGE ELASTIC 6 VELCRO ST LF (GAUZE/BANDAGES/DRESSINGS) ×2 IMPLANT
BANDAGE ESMARK 6X9 LF (GAUZE/BANDAGES/DRESSINGS) ×1 IMPLANT
BANDAGE GAUZE ELAST BULKY 4 IN (GAUZE/BANDAGES/DRESSINGS) ×2 IMPLANT
BLADE CUDA 5.5 (BLADE) IMPLANT
BLADE CUTTER GATOR 3.5 (BLADE) IMPLANT
BLADE GREAT WHITE 4.2 (BLADE) ×4 IMPLANT
BNDG ESMARK 6X9 LF (GAUZE/BANDAGES/DRESSINGS) ×2
BUR OVAL 6.0 (BURR) IMPLANT
CLOTH BEACON ORANGE TIMEOUT ST (SAFETY) ×2 IMPLANT
CONT SPEC 4OZ CLIKSEAL STRL BL (MISCELLANEOUS) ×4 IMPLANT
COVER SURGICAL LIGHT HANDLE (MISCELLANEOUS) ×2 IMPLANT
CUFF TOURNIQUET SINGLE 34IN LL (TOURNIQUET CUFF) IMPLANT
DRAPE ARTHROSCOPY W/POUCH 114 (DRAPES) ×2 IMPLANT
DRAPE EXTREMITY T 121X128X90 (DRAPE) ×2 IMPLANT
DRAPE INCISE IOBAN 66X45 STRL (DRAPES) IMPLANT
DRAPE PROXIMA HALF (DRAPES) ×2 IMPLANT
DRAPE U-SHAPE 47X51 STRL (DRAPES) ×2 IMPLANT
DRSG ADAPTIC 3X8 NADH LF (GAUZE/BANDAGES/DRESSINGS) ×2 IMPLANT
DRSG EMULSION OIL 3X3 NADH (GAUZE/BANDAGES/DRESSINGS) ×2 IMPLANT
DRSG PAD ABDOMINAL 8X10 ST (GAUZE/BANDAGES/DRESSINGS) ×2 IMPLANT
DURAPREP 26ML APPLICATOR (WOUND CARE) ×2 IMPLANT
ELECT CAUTERY BLADE 6.4 (BLADE) IMPLANT
ELECT MENISCUS 165MM 90D (ELECTRODE) IMPLANT
ELECT REM PT RETURN 9FT ADLT (ELECTROSURGICAL) ×2
ELECTRODE REM PT RTRN 9FT ADLT (ELECTROSURGICAL) ×1 IMPLANT
EVACUATOR 1/8 PVC DRAIN (DRAIN) ×4 IMPLANT
GAUZE XEROFORM 1X8 LF (GAUZE/BANDAGES/DRESSINGS) ×2 IMPLANT
GLOVE BIOGEL PI IND STRL 7.5 (GLOVE) IMPLANT
GLOVE BIOGEL PI IND STRL 8.5 (GLOVE) ×2 IMPLANT
GLOVE BIOGEL PI INDICATOR 7.5 (GLOVE)
GLOVE BIOGEL PI INDICATOR 8.5 (GLOVE) ×2
GLOVE SURG ORTHO 7.0 STRL STRW (GLOVE) IMPLANT
GLOVE SURG ORTHO 8.0 STRL STRW (GLOVE) ×4 IMPLANT
GOWN PREVENTION PLUS XLARGE (GOWN DISPOSABLE) ×4 IMPLANT
GOWN STRL NON-REIN LRG LVL3 (GOWN DISPOSABLE) ×6 IMPLANT
HANDPIECE INTERPULSE COAX TIP (DISPOSABLE) ×1
KIT BASIN OR (CUSTOM PROCEDURE TRAY) ×2 IMPLANT
KIT ROOM TURNOVER OR (KITS) ×2 IMPLANT
MANIFOLD NEPTUNE II (INSTRUMENTS) ×2 IMPLANT
NEEDLE 18GX1X1/2 (RX/OR ONLY) (NEEDLE) ×2 IMPLANT
NS IRRIG 1000ML POUR BTL (IV SOLUTION) ×2 IMPLANT
PACK ARTHROSCOPY DSU (CUSTOM PROCEDURE TRAY) ×2 IMPLANT
PACK GENERAL/GYN (CUSTOM PROCEDURE TRAY) ×2 IMPLANT
PAD ARMBOARD 7.5X6 YLW CONV (MISCELLANEOUS) ×4 IMPLANT
PADDING CAST COTTON 6X4 STRL (CAST SUPPLIES) ×2 IMPLANT
PENCIL BUTTON HOLSTER BLD 10FT (ELECTRODE) IMPLANT
SET ARTHROSCOPY TUBING (MISCELLANEOUS) ×1
SET ARTHROSCOPY TUBING LN (MISCELLANEOUS) ×1 IMPLANT
SET HNDPC FAN SPRY TIP SCT (DISPOSABLE) ×1 IMPLANT
SPONGE GAUZE 4X4 12PLY (GAUZE/BANDAGES/DRESSINGS) ×2 IMPLANT
SPONGE LAP 4X18 X RAY DECT (DISPOSABLE) ×2 IMPLANT
STAPLER VISISTAT 35W (STAPLE) ×2 IMPLANT
SUCTION FRAZIER TIP 10 FR DISP (SUCTIONS) ×2 IMPLANT
SUT ETHILON 4 0 PS 2 18 (SUTURE) ×2 IMPLANT
SUT VIC AB 0 CTB1 27 (SUTURE) ×4 IMPLANT
SUT VIC AB 1 CT1 27 (SUTURE)
SUT VIC AB 1 CT1 27XBRD ANBCTR (SUTURE) IMPLANT
SUT VIC AB 2-0 CT1 27 (SUTURE) ×2
SUT VIC AB 2-0 CT1 TAPERPNT 27 (SUTURE) ×2 IMPLANT
SYR 20CC LL (SYRINGE) ×4 IMPLANT
SYR 30ML LL (SYRINGE) ×4 IMPLANT
SYRINGE 10CC LL (SYRINGE) IMPLANT
TOWEL OR 17X24 6PK STRL BLUE (TOWEL DISPOSABLE) ×2 IMPLANT
TOWEL OR 17X26 10 PK STRL BLUE (TOWEL DISPOSABLE) ×2 IMPLANT
TRAY FOLEY CATH 14FR (SET/KITS/TRAYS/PACK) ×2 IMPLANT
TUBE CONNECTING 12X1/4 (SUCTIONS) ×2 IMPLANT
WAND 90 DEG TURBOVAC W/CORD (SURGICAL WAND) ×2 IMPLANT
WATER STERILE IRR 1000ML POUR (IV SOLUTION) ×6 IMPLANT

## 2012-04-17 NOTE — Anesthesia Postprocedure Evaluation (Signed)
  Anesthesia Post-op Note  Patient: [name]  Procedure(s) Performed: Procedure(s) (LRB) with comments: ARTHROSCOPY KNEE (Right) IRRIGATION AND DEBRIDEMENT EXTREMITY (Right)  Patient Location: PACU  Anesthesia Type: General  Level of Consciousness: awake, alert  and oriented  Airway and Oxygen Therapy: Patient Spontanous Breathing and Patient connected to nasal cannula oxygen  Post-op Pain: mild  Post-op Assessment: Post-op Vital signs reviewed  Post-op Vital Signs: Reviewed  Complications: No apparent anesthesia complications

## 2012-04-17 NOTE — H&P (Signed)
  [name] MRN:  [id number] DOB/SEX:  [birth year]/female  CHIEF COMPLAINT:  Painful right Knee  HISTORY: Patient is a 78 y.o. female presented with a history of pain in the right knee. Onset of symptoms was abrupt starting several days ago with gradually worsening course since that time. The patient noted no past surgery on the left knee. Prior procedures on the knee include meniscectomy. Patient has been treated conservatively with over-the-counter NSAIDs and activity modification. Patient currently rates pain in the knee at 10 out of 10 with activity. There is pain at night.  Knee was aspirated yesterday with a mixture of bloody/purlent material.  PAST MEDICAL HISTORY: There are no active problems to display for this patient.  Past Medical History  Diagnosis Date  . Breast cancer   . Hypertension   . High cholesterol   . Osteopetrosis    Past Surgical History  Procedure Date  . Breast lumpectomy      MEDICATIONS:   (Not in a hospital admission)  ALLERGIES:   Allergies  Allergen Reactions  . Codeine Nausea And Vomiting  . Oysters (Shellfish Allergy) Nausea And Vomiting    REVIEW OF SYSTEMS:  Pertinent items are noted in HPI.   FAMILY HISTORY:  No family history on file.  SOCIAL HISTORY:   History  Substance Use Topics  . Smoking status: Not on file  . Smokeless tobacco: Not on file  . Alcohol Use: No     EXAMINATION:  Vital signs in last 24 hours: @VSRANGES @  General appearance: alert, cooperative and no distress Lungs: clear to auscultation bilaterally Heart: regular rate and rhythm, S1, S2 normal, no murmur, click, rub or gallop Abdomen: soft, non-tender; bowel sounds normal; no masses,  no organomegaly Extremities: extremities normal, atraumatic, no cyanosis or edema and Homans sign is negative, no sign of DVT Pulses: 2+ and symmetric Skin: Skin color, texture, turgor normal. No rashes or lesions Neurologic: Alert and oriented X 3, normal strength  and tone. Normal symmetric reflexes. Normal coordination and gait  Musculoskeletal:  ROM 0-90, Ligaments intact, knee is red,swollen   Assessment/Plan: Right infected knee  Right knee I&D  The patient history, physical examination and imaging studies are consistent with infection of the right knee. The patient has failed conservative treatment.  The clearance notes were reviewed.  The risks including but not limited to aseptic loosening, infection, blood clots, vascular injury, stiffness, patella tracking problems complications among others were discussed. The patient acknowledged the explanation, agreed to proceed with the plan.  Cyntia Staley 04/17/2012, 7:37 AM

## 2012-04-17 NOTE — Transfer of Care (Signed)
Immediate Anesthesia Transfer of Care Note  Patient: [name]  Procedure(s) Performed: Procedure(s) (LRB) with comments: ARTHROSCOPY KNEE (Right) IRRIGATION AND DEBRIDEMENT EXTREMITY (Right)  Patient Location: PACU  Anesthesia Type: General  Level of Consciousness: awake, alert  and patient cooperative  Airway & Oxygen Therapy: Patient Spontanous Breathing and Patient connected to nasal cannula oxygen  Post-op Assessment: Report given to PACU RN and Patient moving all extremities X 4  Post vital signs: Reviewed and stable  Complications: No apparent anesthesia complications

## 2012-04-17 NOTE — Preoperative (Signed)
Beta Blockers   Reason not to administer Beta Blockers:Not Applicable 

## 2012-04-17 NOTE — Progress Notes (Signed)
Received 78 y/o female via stretcher on unit , AOX3 for c/o severe left knee pain secondary to prior surgery and infection. Patient positioned comfortably in her bed and v/s assessed: BP 136/63 hr 87, Resp 20 Temp 98.3 (oral). Patient is able to answer all question and stated when pain developed and its progression and severity. Called Dr. Sherlean Foot and confirmed that patient is scheduled for surgery this afternoon. Patient is NPO at this time.Attempted x 2 to insert IV line but unsuccessful. IV team notified but unsuccessful. OR notified of same. As per OR Anesthesia will be to place IV access. Procedure and blood consents signed by her husband.

## 2012-04-17 NOTE — Anesthesia Procedure Notes (Signed)
Procedure Name: LMA Insertion Date/Time: 04/17/2012 5:17 PM Performed by: Jefm Miles E Pre-anesthesia Checklist: Patient identified, Timeout performed, Emergency Drugs available, Suction available and Patient being monitored Patient Re-evaluated:Patient Re-evaluated prior to inductionOxygen Delivery Method: Circle system utilized Preoxygenation: Pre-oxygenation with 100% oxygen Intubation Type: IV induction Ventilation: Mask ventilation without difficulty LMA: LMA with gastric port inserted LMA Size: 4.0 Number of attempts: 2 Placement Confirmation: positive ETCO2 and breath sounds checked- equal and bilateral Tube secured with: Tape Dental Injury: Teeth and Oropharynx as per pre-operative assessment  Comments: LMA inserted, but unable to ventilate, LMA Supreme inserted with good tidal volumes and VSS

## 2012-04-17 NOTE — Progress Notes (Signed)
Unable to administer IV fluids and and medication as ordered by MD due to unavailability of IV access. Altamese Cabal notified.

## 2012-04-17 NOTE — Progress Notes (Signed)
Received patient on unit form PACU , alert and oriented . S/P arthroscopy of the right knee. V/S assessed and stable. BP 130/70, HR 70, Resp 20, O2sat 99%.

## 2012-04-17 NOTE — Anesthesia Preprocedure Evaluation (Signed)
Anesthesia Evaluation  Patient identified by MRN, date of birth, ID band Patient awake  General Assessment Comment:History of infected knee. CE  Reviewed: Allergy & Precautions, H&P , NPO status , Patient's Chart, lab work & pertinent test results  Airway Mallampati: II      Dental   Pulmonary          Cardiovascular hypertension, Pt. on medications     Neuro/Psych    GI/Hepatic Neg liver ROS,   Endo/Other  negative endocrine ROS  Renal/GU      Musculoskeletal   Abdominal   Peds  Hematology   Anesthesia Other Findings   Reproductive/Obstetrics                           Anesthesia Physical Anesthesia Plan  ASA: III  Anesthesia Plan: General   Post-op Pain Management:    Induction: Intravenous  Airway Management Planned: LMA  Additional Equipment:   Intra-op Plan:   Post-operative Plan: Extubation in OR  Informed Consent:   Plan Discussed with: Anesthesiologist and CRNA  Anesthesia Plan Comments:         Anesthesia Quick Evaluation

## 2012-04-18 ENCOUNTER — Encounter (HOSPITAL_COMMUNITY): Payer: Self-pay | Admitting: Orthopedic Surgery

## 2012-04-18 DIAGNOSIS — M009 Pyogenic arthritis, unspecified: Principal | ICD-10-CM

## 2012-04-18 DIAGNOSIS — T8859XA Other complications of anesthesia, initial encounter: Secondary | ICD-10-CM

## 2012-04-18 HISTORY — DX: Other complications of anesthesia, initial encounter: T88.59XA

## 2012-04-18 LAB — URINE MICROSCOPIC-ADD ON

## 2012-04-18 LAB — CBC
HCT: 35 % — ABNORMAL LOW (ref 36.0–46.0)
Hemoglobin: 11.8 g/dL — ABNORMAL LOW (ref 12.0–15.0)
MCH: 31.6 pg (ref 26.0–34.0)
MCHC: 33.7 g/dL (ref 30.0–36.0)
MCV: 93.6 fL (ref 78.0–100.0)

## 2012-04-18 LAB — URINALYSIS, ROUTINE W REFLEX MICROSCOPIC
Ketones, ur: NEGATIVE mg/dL
Nitrite: NEGATIVE
pH: 5.5 (ref 5.0–8.0)

## 2012-04-18 LAB — C-REACTIVE PROTEIN: CRP: 52.6 mg/dL — ABNORMAL HIGH (ref <0.60)

## 2012-04-18 LAB — SEDIMENTATION RATE: Sed Rate: 105 mm/hr — ABNORMAL HIGH (ref 0–22)

## 2012-04-18 LAB — BASIC METABOLIC PANEL
BUN: 27 mg/dL — ABNORMAL HIGH (ref 6–23)
CO2: 29 mEq/L (ref 19–32)
Calcium: 9.3 mg/dL (ref 8.4–10.5)
GFR calc non Af Amer: 80 mL/min — ABNORMAL LOW (ref >90)
Glucose, Bld: 101 mg/dL — ABNORMAL HIGH (ref 70–99)

## 2012-04-18 MED ORDER — SODIUM CHLORIDE 0.9 % IV SOLN
INTRAVENOUS | Status: DC
  Administered 2012-04-19: 14:00:00 via INTRAVENOUS
  Administered 2012-04-20: 1000 mL via INTRAVENOUS
  Administered 2012-04-21 – 2012-04-23 (×2): via INTRAVENOUS

## 2012-04-18 MED ORDER — VANCOMYCIN HCL IN DEXTROSE 1-5 GM/200ML-% IV SOLN
1000.0000 mg | Freq: Two times a day (BID) | INTRAVENOUS | Status: AC
  Administered 2012-04-18 – 2012-04-19 (×3): 1000 mg via INTRAVENOUS
  Filled 2012-04-18 (×3): qty 200

## 2012-04-18 NOTE — Progress Notes (Signed)
Utilization review completed. Arianna Haydon, RN, BSN. 

## 2012-04-18 NOTE — Evaluation (Signed)
Physical Therapy Evaluation Patient Details Name: [name] MRN: [id number] DOB: Jun 14, 1932 Today's Date: 04/18/2012 Time: 4098-1191 PT Time Calculation (min): 16 min  PT Assessment / Plan / Recommendation Clinical Impression  Pt declined OOB with PT at this time secondary to increase pain with activity. RN notified but pt declined pain medication.  Repositioned R LE for pain relief and applied ICE pack. Contacted PA for clarifcation of pt weight bearing status and any restrictions.  Pt has no ROM restrictions and is full weight bearing in R LE.  Will attempt to complete eval later today.      PT Assessment  Patient needs continued PT services    Follow Up Recommendations  Other (comment) (to be determined.)    Barriers to Discharge        Equipment Recommendations  None recommended by PT    Recommendations for Other Services     Frequency Min 5X/week    Precautions / Restrictions Precautions Precautions: None Restrictions Weight Bearing Restrictions: No RLE Weight Bearing: Weight bearing as tolerated Other Position/Activity Restrictions: No ROM restirctions .    Pertinent Vitals/Pain Pt reporting pain in R knee 9/10 but described pain as not much.  Pt had difficulty with pain scale       Mobility       Shoulder Instructions     Exercises     PT Diagnosis: Generalized weakness;Acute pain  PT Problem List: Decreased strength;Decreased range of motion;Decreased activity tolerance;Decreased mobility;Decreased coordination;Pain PT Treatment Interventions: Gait training;DME instruction;Stair training;Functional mobility training;Therapeutic activities;Therapeutic exercise;Patient/family education   PT Goals Acute Rehab PT Goals PT Goal Formulation: With patient/family  Visit Information  Last PT Received On: 04/18/12    Subjective Data  Subjective: I can do any therapy today.    Prior Functioning  Home Living Lives With: Spouse Available Help at Discharge:  Personal care attendant (4 hours per day.) Type of Home: House Home Access: Stairs to enter [employer] of Steps: 3 Entrance Stairs-Rails: Left Home Layout: One level;Able to live on main level with bedroom/bathroom Bathroom Shower/Tub: Walk-in shower;Door [employer] Toilet: Standard Home Adaptive Equipment: Bedside commode/3-in-1;Walker - rolling Prior Function Level of Independence: Independent Able to Take Stairs?: Yes Communication Communication: No difficulties    Cognition  Overall Cognitive Status: Appears within functional limits for tasks assessed/performed Arousal/Alertness: Awake/alert Orientation Level: Oriented X4 / Intact Behavior During Session: Agitated Cognition - Other Comments: Pt agitated    Extremity/Trunk Assessment Right Upper Extremity Assessment RUE ROM/Strength/Tone: Within functional levels Left Upper Extremity Assessment LUE ROM/Strength/Tone: Within functional levels Right Lower Extremity Assessment RLE ROM/Strength/Tone: Unable to fully assess;Due to pain Left Lower Extremity Assessment LLE ROM/Strength/Tone: Within functional levels   Balance    End of Session PT - End of Session Activity Tolerance: Patient limited by pain Patient left: in bed;with call bell/phone within reach;with family/visitor present  GP     [name] 04/18/2012, 12:20 PM [name] L. Arseniy Toomey DPT [phone]

## 2012-04-18 NOTE — Progress Notes (Signed)
  Pamela Spurling, MD   Pamela Cabal, PA-C [address] [place], [place], [place]  [address]                             [phone]   PROGRESS NOTE  Subjective:  negative for Chest Pain  negative for Shortness of Breath  negative for Nausea/Vomiting   negative for Calf Pain  negative for Bowel Movement   Tolerating Diet: yes         Patient reports pain as 10 on 0-10 scale.    Objective: Vital signs in last 24 hours:   Patient Vitals for the past 24 hrs:  BP Temp Temp src Pulse Resp SpO2  04/18/12 0619 122/62 mmHg 97.3 F (36.3 C) Oral 77  16  96 %  04/18/12 0400 - - - - 14  97 %  04/18/12 0100 105/47 mmHg 97.4 F (36.3 C) Oral 72  14  97 %  04/18/12 0000 - - - - 15  95 %  04/17/12 2040 117/51 mmHg 98.5 F (36.9 C) Oral 77  14  95 %  04/17/12 2000 - - - - 15  95 %  04/17/12 1849 151/63 mmHg 99 F (37.2 C) - - 15  100 %  04/17/12 1840 153/58 mmHg - - 89  13  99 %  04/17/12 1827 142/58 mmHg - - - 21  -  04/17/12 1822 142/58 mmHg 98.7 F (37.1 C) - 91  10  98 %    @flow {1959:LAST@   Intake/Output from previous day:   09/24 0701 - 09/25 0700 In: 800 [I.V.:800] Out: 2452 [Urine:2352; Drains:100]   Intake/Output this shift:       Intake/Output      09/24 0701 - 09/25 0700 09/25 0701 - 09/26 0700   I.V. 800    Total Intake 800    Urine 2352    Drains 100    Total Output 2452    Net -1652            LABORATORY DATA:  Basename 04/18/12 0547 04/17/12 1150 04/14/12 1832  WBC 9.2 12.0* 13.9*  HGB 11.8* 12.5 14.0  HCT 35.0* 36.8 40.0  PLT 172 169 171    Basename 04/18/12 0547 04/17/12 1150 04/14/12 1832  NA 138 135 137  K 3.6 3.4* 4.0  CL 96 94* 97  CO2 29 29 28   BUN 27* 29* 28*  CREATININE 0.73 0.83 0.71  GLUCOSE 101* 128* 117*  CALCIUM 9.3 10.0 11.3*   Lab Results  Component Value Date   INR 0.98 02/01/2011   INR 0.93 07/20/2010   INR 1.0 04/10/2008    Examination:  General appearance: alert, cooperative and no distress Extremities: Homans  sign is negative, no sign of DVT  Wound Exam: clean, dry, intact   Drainage:  Scant/small amount Serosanguinous exudate  Motor Exam: EHL and FHL Intact  Sensory Exam: Deep Peroneal normal  Vascular Exam:    Assessment:    1 Day Post-Op  Procedure(s) (LRB): ARTHROSCOPY KNEE (Right) IRRIGATION AND DEBRIDEMENT EXTREMITY (Right)  ADDITIONAL DIAGNOSIS:  Active Problems:  * No active hospital problems. *   Acute Blood Loss Anemia   Plan: Occupational Therapy as ordered Weight Bearing as Tolerated (WBAT)  DVT Prophylaxis:  Aspirin  DISCHARGE PLAN: Home  DISCHARGE NEEDS: HHPT, HHRN, Walker and 3-in-1 comode seat         [name] 04/18/2012, 1:36 PM

## 2012-04-18 NOTE — Consult Note (Signed)
INFECTIOUS DISEASE CONSULT NOTE  Date of Admission:  04/17/2012  Date of Consult:  04/18/2012  Reason for Consult:Septic arthritis Referring Physician: Dr Sherlean Foot  Impression/Recommendation Septic arthritis Wound infection Would- Continue vancomycin Check ESR and CRP Await Cx's  Comment- could change pt to cephalosporin given GPC in pairs (suggests strep) but would rather wait for cx confirmation first. She has no hardware so will not add rifampin.   Thank you so much for this interesting consult,   [name] [phone]  [name] is an 78 y.o. female.  HPI: 78 yo F with hx of R knee meniscectomy on 04-11-12. She returns on 9-24 with worsening pain that was unrelieved with OTC pain medications. States that her knee became swollen, tender. She underwent aspiration of her knee at her MD's office and was found to have blood and pustulant material. She was adm 9-24 and underwent I & D of her R knee. Gram stain showed GPC pairs.   Past Medical History  Diagnosis Date  . Breast cancer   . Hypertension   . High cholesterol   . Osteopetrosis     Past Surgical History  Procedure Date  . Breast lumpectomy   . Knee arthroscopy 04/17/2012    Procedure: ARTHROSCOPY KNEE;  Surgeon: Raymon Mutton, MD;  Location: [facility] OR;  Service: Orthopedics;  Laterality: Right;  . I&d extremity 04/17/2012    Procedure: IRRIGATION AND DEBRIDEMENT EXTREMITY;  Surgeon: Raymon Mutton, MD;  Location: [facility] OR;  Service: Orthopedics;  Laterality: Right;     Allergies  Allergen Reactions  . Codeine Nausea And Vomiting  . Oysters (Shellfish Allergy) Nausea And Vomiting    Medications:  Scheduled:   . aspirin EC  325 mg Oral Q breakfast  . celecoxib  200 mg Oral BID  . docusate sodium  100 mg Oral BID  . escitalopram  10 mg Oral q morning - 10a  . simvastatin  20 mg Oral q morning - 10a  . Teriparatide (Recombinant)  20 mcg Subcutaneous Daily  . triamterene-hydrochlorothiazide  1 tablet Oral  Daily  . vancomycin  1,000 mg Intravenous Q12H  . vancomycin  1,000 mg Intravenous Q12H  . DISCONTD: acetaminophen  1,000 mg Intravenous Q6H  . DISCONTD: chlorhexidine  60 mL Topical Once    Total days of antibiotics 1 vancomycin  Social History:  does not have a smoking history on file. She does not have any smokeless tobacco history on file. She reports that she does not drink alcohol. Her drug history not on file.  Family History  Problem Relation Age of Onset  . Kidney failure Mother     ROS: constipated, urinary retention. No headaches, no vision changes. See HPI.  Blood pressure 137/62, pulse 88, temperature 100.1 F (37.8 C), temperature source Oral, resp. rate 18, SpO2 96.00%. General appearance: alert, cooperative and no distress Eyes: negative findings: pupils equal, round, reactive to light and accomodation Throat: abnormal findings: dry, few punctate lesions.   Neck: no adenopathy Lungs: clear to auscultation bilaterally Heart: regular rate and rhythm Abdomen: normal findings: bowel sounds normal and soft, non-tender Extremities: RLE wrapped. normal light touch   Results for orders placed during the hospital encounter of 04/17/12 (from the past 48 hour(s))  CBC WITH DIFFERENTIAL     Status: Abnormal   Collection Time   04/17/12 11:50 AM      Component Value Range Comment   WBC 12.0 (*) 4.0 - 10.5 K/uL    RBC 3.93  3.87 -  5.11 MIL/uL    Hemoglobin 12.5  12.0 - 15.0 g/dL    HCT 16.1  09.6 - 04.5 %    MCV 93.6  78.0 - 100.0 fL    MCH 31.8  26.0 - 34.0 pg    MCHC 34.0  30.0 - 36.0 g/dL    RDW 40.9  81.1 - 91.4 %    Platelets 169  150 - 400 K/uL    Neutrophils Relative 83 (*) 43 - 77 %    Neutro Abs 10.0 (*) 1.7 - 7.7 K/uL    Lymphocytes Relative 6 (*) 12 - 46 %    Lymphs Abs 0.7  0.7 - 4.0 K/uL    Monocytes Relative 11  3 - 12 %    Monocytes Absolute 1.3 (*) 0.1 - 1.0 K/uL    Eosinophils Relative 0  0 - 5 %    Eosinophils Absolute 0.0  0.0 - 0.7 K/uL     Basophils Relative 0  0 - 1 %    Basophils Absolute 0.0  0.0 - 0.1 K/uL   COMPREHENSIVE METABOLIC PANEL     Status: Abnormal   Collection Time   04/17/12 11:50 AM      Component Value Range Comment   Sodium 135  135 - 145 mEq/L    Potassium 3.4 (*) 3.5 - 5.1 mEq/L    Chloride 94 (*) 96 - 112 mEq/L    CO2 29  19 - 32 mEq/L    Glucose, Bld 128 (*) 70 - 99 mg/dL    BUN 29 (*) 6 - 23 mg/dL    Creatinine, Ser 7.82  0.50 - 1.10 mg/dL    Calcium 95.6  8.4 - 10.5 mg/dL    Total Protein 7.5  6.0 - 8.3 g/dL    Albumin 3.1 (*) 3.5 - 5.2 g/dL    AST 36  0 - 37 U/L    ALT 29  0 - 35 U/L    Alkaline Phosphatase 115  39 - 117 U/L    Total Bilirubin 1.2  0.3 - 1.2 mg/dL    GFR calc non Af Amer 66 (*) >90 mL/min    GFR calc Af Amer 77 (*) >90 mL/min   C-REACTIVE PROTEIN     Status: Abnormal   Collection Time   04/17/12 11:50 AM      Component Value Range Comment   CRP 52.6 (*) <0.60 mg/dL Result confirmed by automatic dilution.  SEDIMENTATION RATE     Status: Abnormal   Collection Time   04/17/12 11:50 AM      Component Value Range Comment   Sed Rate 105 (*) 0 - 22 mm/hr   WOUND CULTURE     Status: Normal (Preliminary result)   Collection Time   04/17/12  5:42 PM      Component Value Range Comment   Specimen Description WOUND KNEE RIGHT      Special Requests NONE      Gram Stain        Value: MODERATE WBC PRESENT, PREDOMINANTLY PMN     NO SQUAMOUS EPITHELIAL CELLS SEEN     FEW GRAM POSITIVE COCCI     IN PAIRS   Culture NO GROWTH      Report Status PENDING     ANAEROBIC CULTURE     Status: Normal (Preliminary result)   Collection Time   04/17/12  5:42 PM      Component Value Range Comment   Specimen Description WOUND KNEE RIGHT  Special Requests NONE      Gram Stain PENDING      Culture        Value: NO ANAEROBES ISOLATED; CULTURE IN PROGRESS FOR 5 DAYS   Report Status PENDING     URINALYSIS, ROUTINE W REFLEX MICROSCOPIC     Status: Abnormal   Collection Time   04/18/12  3:09 AM        Component Value Range Comment   Color, Urine AMBER (*) YELLOW BIOCHEMICALS MAY BE AFFECTED BY COLOR   APPearance CLOUDY (*) CLEAR    Specific Gravity, Urine 1.028  1.005 - 1.030    pH 5.5  5.0 - 8.0    Glucose, UA NEGATIVE  NEGATIVE mg/dL    Hgb urine dipstick MODERATE (*) NEGATIVE    Bilirubin Urine SMALL (*) NEGATIVE    Ketones, ur NEGATIVE  NEGATIVE mg/dL    Protein, ur 161 (*) NEGATIVE mg/dL    Urobilinogen, UA 1.0  0.0 - 1.0 mg/dL    Nitrite NEGATIVE  NEGATIVE    Leukocytes, UA SMALL (*) NEGATIVE   URINE MICROSCOPIC-ADD ON     Status: Abnormal   Collection Time   04/18/12  3:09 AM      Component Value Range Comment   Squamous Epithelial / LPF FEW (*) RARE    WBC, UA 3-6  <3 WBC/hpf    RBC / HPF 7-10  <3 RBC/hpf    Casts GRANULAR CAST (*) NEGATIVE    Urine-Other RARE YEAST     CBC     Status: Abnormal   Collection Time   04/18/12  5:47 AM      Component Value Range Comment   WBC 9.2  4.0 - 10.5 K/uL    RBC 3.74 (*) 3.87 - 5.11 MIL/uL    Hemoglobin 11.8 (*) 12.0 - 15.0 g/dL    HCT 09.6 (*) 04.5 - 46.0 %    MCV 93.6  78.0 - 100.0 fL    MCH 31.6  26.0 - 34.0 pg    MCHC 33.7  30.0 - 36.0 g/dL    RDW 40.9  81.1 - 91.4 %    Platelets 172  150 - 400 K/uL   BASIC METABOLIC PANEL     Status: Abnormal   Collection Time   04/18/12  5:47 AM      Component Value Range Comment   Sodium 138  135 - 145 mEq/L    Potassium 3.6  3.5 - 5.1 mEq/L    Chloride 96  96 - 112 mEq/L    CO2 29  19 - 32 mEq/L    Glucose, Bld 101 (*) 70 - 99 mg/dL    BUN 27 (*) 6 - 23 mg/dL    Creatinine, Ser 7.82  0.50 - 1.10 mg/dL    Calcium 9.3  8.4 - 95.6 mg/dL    GFR calc non Af Amer 80 (*) >90 mL/min    GFR calc Af Amer >90  >90 mL/min       Component Value Date/Time   SDES WOUND KNEE RIGHT 04/17/2012 1742   SDES WOUND KNEE RIGHT 04/17/2012 1742   SPECREQUEST NONE 04/17/2012 1742   SPECREQUEST NONE 04/17/2012 1742   CULT NO GROWTH 04/17/2012 1742   CULT NO ANAEROBES ISOLATED; CULTURE IN PROGRESS  FOR 5 DAYS 04/17/2012 1742   REPTSTATUS PENDING 04/17/2012 1742   REPTSTATUS PENDING 04/17/2012 1742   Dg Chest Port 1 View  04/17/2012  *RADIOLOGY REPORT*  Clinical Data: Preoperative assessment for irrigation and  debridement, history hypertension, breast cancer  PORTABLE CHEST - 1 VIEW  Comparison: Portable exam 1611 hours compared to 07/16/2005  Findings: Mild kyphotic positioning with slight rotation to the right. Enlargement of cardiac silhouette. Vascular congestion. Tortuous aorta. Question enlargement of right hilum. Chronic peribronchial thickening. Increased accentuation of perihilar markings since previous exam could be related to mild infiltrate or edema. Minimal bibasilar atelectasis. No pleural effusion or pneumothorax. Osseous demineralization. Surgical clips left axilla.  IMPRESSION: Enlargement of cardiac silhouette with pulmonary vascular congestion. Bronchitic changes with increased perihilar markings, cannot exclude minimal edema or infection. Mild bibasilar atelectasis. Question right hilar enlargement versus artifact from rotation; recommend follow-up upright PA and lateral chest radiographs when the patient's clinical condition permits for further assessment to exclude adenopathy.   Original Report Authenticated By: Lollie Marrow, M.D.    Recent Results (from the past 240 hour(s))  WOUND CULTURE     Status: Normal (Preliminary result)   Collection Time   04/17/12  5:42 PM      Component Value Range Status Comment   Specimen Description WOUND KNEE RIGHT   Final    Special Requests NONE   Final    Gram Stain     Final    Value: MODERATE WBC PRESENT, PREDOMINANTLY PMN     NO SQUAMOUS EPITHELIAL CELLS SEEN     FEW GRAM POSITIVE COCCI     IN PAIRS   Culture NO GROWTH   Final    Report Status PENDING   Incomplete   ANAEROBIC CULTURE     Status: Normal (Preliminary result)   Collection Time   04/17/12  5:42 PM      Component Value Range Status Comment   Specimen Description WOUND  KNEE RIGHT   Final    Special Requests NONE   Final    Gram Stain PENDING   Incomplete    Culture     Final    Value: NO ANAEROBES ISOLATED; CULTURE IN PROGRESS FOR 5 DAYS   Report Status PENDING   Incomplete       04/18/2012, 4:14 PM     LOS: 1 day

## 2012-04-19 DIAGNOSIS — Y838 Other surgical procedures as the cause of abnormal reaction of the patient, or of later complication, without mention of misadventure at the time of the procedure: Secondary | ICD-10-CM

## 2012-04-19 DIAGNOSIS — T8140XA Infection following a procedure, unspecified, initial encounter: Secondary | ICD-10-CM

## 2012-04-19 LAB — C-REACTIVE PROTEIN: CRP: 32.8 mg/dL — ABNORMAL HIGH (ref <0.60)

## 2012-04-19 LAB — URINE CULTURE: Culture: NO GROWTH

## 2012-04-19 LAB — CBC
Hemoglobin: 11.8 g/dL — ABNORMAL LOW (ref 12.0–15.0)
MCHC: 33.9 g/dL (ref 30.0–36.0)
Platelets: 182 10*3/uL (ref 150–400)
RBC: 3.74 MIL/uL — ABNORMAL LOW (ref 3.87–5.11)

## 2012-04-19 MED ORDER — CEFAZOLIN SODIUM 1-5 GM-% IV SOLN
1.0000 g | Freq: Three times a day (TID) | INTRAVENOUS | Status: DC
  Administered 2012-04-19 – 2012-04-20 (×3): 1 g via INTRAVENOUS
  Filled 2012-04-19 (×5): qty 50

## 2012-04-19 MED ORDER — BISACODYL 10 MG RE SUPP
10.0000 mg | Freq: Once | RECTAL | Status: AC
  Administered 2012-04-19: 10 mg via RECTAL
  Filled 2012-04-19: qty 1

## 2012-04-19 MED ORDER — SENNOSIDES-DOCUSATE SODIUM 8.6-50 MG PO TABS
1.0000 | ORAL_TABLET | Freq: Two times a day (BID) | ORAL | Status: DC
  Administered 2012-04-19 – 2012-04-23 (×7): 1 via ORAL
  Filled 2012-04-19 (×4): qty 1

## 2012-04-19 NOTE — Op Note (Signed)
Dictation Number: [id number]

## 2012-04-19 NOTE — Progress Notes (Signed)
  [name], MD   Altamese Cabal, PA-C [address] [place], [place], [place]  [address]                             [phone]   PROGRESS NOTE  Subjective:  negative for Chest Pain  negative for Shortness of Breath  negative for Nausea/Vomiting   negative for Calf Pain  negative for Bowel Movement   Tolerating Diet: yes         Patient reports pain as 4 on 0-10 scale.    Objective: Vital signs in last 24 hours:   Patient Vitals for the past 24 hrs:  BP Temp Pulse Resp SpO2  04/19/12 1200 - - - 18  -  04/19/12 0800 - - - 16  -  04/19/12 0654 124/68 mmHg 98.6 F (37 C) 88  18  96 %  04/19/12 0000 - - - 16  95 %  04/18/12 2239 124/45 mmHg 98.5 F (36.9 C) 88  18  95 %  04/18/12 2000 - - - 18  95 %  04/18/12 1600 - - - 16  97 %    @flow {1959:LAST@   Intake/Output from previous day:   09/25 0701 - 09/26 0700 In: -  Out: 1950 [Urine:1700; Drains:250]   Intake/Output this shift:       Intake/Output      09/25 0701 - 09/26 0700 09/26 0701 - 09/27 0700   I.V.     Total Intake     Urine 1700    Drains 250    Total Output 1950    Net -1950            LABORATORY DATA:  Basename 04/19/12 0605 04/18/12 0547 04/17/12 1150 04/14/12 1832  WBC 8.4 9.2 12.0* 13.9*  HGB 11.8* 11.8* 12.5 14.0  HCT 34.8* 35.0* 36.8 40.0  PLT 182 172 169 171    Basename 04/18/12 0547 04/17/12 1150 04/14/12 1832  NA 138 135 137  K 3.6 3.4* 4.0  CL 96 94* 97  CO2 29 29 28   BUN 27* 29* 28*  CREATININE 0.73 0.83 0.71  GLUCOSE 101* 128* 117*  CALCIUM 9.3 10.0 11.3*   Lab Results  Component Value Date   INR 0.98 02/01/2011   INR 0.93 07/20/2010   INR 1.0 04/10/2008    Examination:  General appearance: alert, cooperative and no distress Extremities: Homans sign is negative, no sign of DVT  Wound Exam: clean, dry, intact   Drainage:  None: wound tissue dry  Motor Exam: EHL and FHL Intact  Sensory Exam: Deep Peroneal normal  Vascular Exam:    Assessment:    2 Days  Post-Op  Procedure(s) (LRB): ARTHROSCOPY KNEE (Right) IRRIGATION AND DEBRIDEMENT EXTREMITY (Right)  ADDITIONAL DIAGNOSIS:  Active Problems:  * No active hospital problems. *   Acute Blood Loss Anemia   Plan: Physical Therapy as ordered Weight Bearing as Tolerated (WBAT)  DVT Prophylaxis:  Aspirin  DISCHARGE PLAN: Skilled Nursing Facility/Rehab vs home  DISCHARGE NEEDS: HHPT, HHRN, Walker and 3-in-1 comode seat         Daniel Ritthaler 04/19/2012, 1:37 PM

## 2012-04-19 NOTE — Progress Notes (Signed)
Physical Therapy Treatment Patient Details Name: [name] MRN: [id number] DOB: 09/15/1931 Today's Date: 04/19/2012 Time: 1191-4782 PT Time Calculation (min): 32 min  PT Assessment / Plan / Recommendation Comments on Treatment Session  Pt required more assistance this session than prior session. Pt reporting worse pain in knee.  Daughter present for session and agrees that pt will not have the amount of  help at home that she required today.      Follow Up Recommendations  Inpatient Rehab;Supervision/Assistance - 24 hour    Barriers to Discharge        Equipment Recommendations  None recommended by PT    Recommendations for Other Services Rehab consult  Frequency Min 5X/week   Plan Discharge plan remains appropriate;Frequency remains appropriate    Precautions / Restrictions Precautions Precautions: None Restrictions Weight Bearing Restrictions: No RLE Weight Bearing: Weight bearing as tolerated Other Position/Activity Restrictions: No ROM restirctions .    Pertinent Vitals/Pain Pt reporting pain in R knee 8/10.  Repositioned R LE on pillows, applied ICE pack and notified RN of pt desire for pain medication.  PT attempted to void on bedside commode unsuccessfully .      Mobility  Bed Mobility Bed Mobility: Sit to Supine Supine to Sit: Not tested (comment) Supine to Sit: Patient Percentage: 40% Sitting - Scoot to Edge of Bed: Not tested (comment) Sitting - Scoot to Edge of Bed: Patient Percentage: 70% Sit to Supine: 2: Max assist;HOB flat Sit to Supine: Patient Percentage: 40% Details for Bed Mobility Assistance: Assist for bilateral LE management, cues to lean to left side prior to rolling over to her back.   Transfers Transfers: Sit to Stand;Stand to [employer] (two trials of each. ) Sit to Stand: 1: +2 Total assist;With upper extremity assist;From chair/3-in-1;With armrests (1 from recliner 1 from 3 in1 in high position. ) Sit to Stand: Patient  Percentage: 30% Stand to Sit: 1: +2 Total assist;With upper extremity assist;To elevated surface;With armrests;To bed;To chair/3-in-1 (1 to 3 in 1, 1 to bed. ) Stand to Sit: Patient Percentage: 40% Stand Pivot Transfers: 1: +2 Total assist Stand Pivot Transfers: Patient Percentage: 30% Details for Transfer Assistance: Step by step cueing for technique including hand placement, position of  R LE, and scoot to edge of chair.  Assist to initiate standing secondary to weakness and pain.  Manual facilitation to manage large percentage of pt's body weight.  Step by step verbal and tactilce cueing for stand pivot transfer.  Pt required assistance to manage RW and Manual facilitation to initate pivot on LLE.  Pt having difficulty moving R LE (forward and backward), and pt unable to move L LE without significant assistance to support pt's weight secondary to pain/weakness in R LE stance.   Ambulation/Gait Ambulation/Gait Assistance: Not tested (comment) Ambulation Distance (Feet): 4 Feet Assistive device: Rolling walker Ambulation/Gait Assistance Details: Pt requied assistance to suport her body wt while pt advanced L LE.  Pt required repeated step by step verbal cueing, assistance to manage RW and tactile cues to shift wt forward and decrease posterior lean.   Gait Pattern: Step-to pattern;Decreased hip/knee flexion - right;Decreased hip/knee flexion - left;Decreased stance time - right;Decreased step length - right;Decreased step length - left;Decreased weight shift to right;Right flexed knee in stance;Festinating;Trunk flexed;Narrow base of support Gait velocity: excessively slow.  long pause between each step  General Gait Details: Pain appears to be primary limiting factor.  Pt fearful of WB on R LE.  Stairs: No Wheelchair  Mobility Wheelchair Mobility: No    Exercises Total Joint Exercises Ankle Circles/Pumps: Both;10 reps Quad Sets: Right;5 reps;Supine Heel Slides: 5 reps;Right;Supine;AAROM   PT  Diagnosis:    PT Problem List:   PT Treatment Interventions:     PT Goals Acute Rehab PT Goals PT Goal Formulation: With patient/family Time For Goal Achievement: 05/03/12 Potential to Achieve Goals: Fair Pt will go Supine/Side to Sit: with supervision;with HOB 0 degrees PT Goal: Supine/Side to Sit - Progress: Goal set today Pt will go Sit to Supine/Side: with supervision;with HOB 0 degrees PT Goal: Sit to Supine/Side - Progress: Goal set today Pt will go Sit to Stand: with supervision;with upper extremity assist;from elevated surface PT Goal: Sit to Stand - Progress: Goal set today Pt will go Stand to Sit: with supervision;with upper extremity assist PT Goal: Stand to Sit - Progress: Goal set today Pt will Transfer Bed to Chair/Chair to Bed: with min assist PT Transfer Goal: Bed to Chair/Chair to Bed - Progress: Goal set today Pt will Ambulate: 16 - 50 feet;with supervision;with rolling walker PT Goal: Ambulate - Progress: Goal set today  Visit Information  Last PT Received On: 04/19/12 PT/OT Co-Evaluation/Treatment: Yes    Subjective Data  Subjective: I want to go back to bed Patient Stated Goal: Walk without pain.    Cognition  Overall Cognitive Status: Appears within functional limits for tasks assessed/performed Arousal/Alertness: Awake/alert Orientation Level: Oriented X4 / Intact Behavior During Session: WFL for tasks performed    Balance  Balance Balance Assessed: No Static Sitting Balance Static Sitting - Balance Support: No upper extremity supported;Feet supported Static Sitting - Level of Assistance: 5: Stand by assistance Static Sitting - Comment/# of Minutes: no lob sitting on EOB dressing with OT.    End of Session PT - End of Session Equipment Utilized During Treatment: Gait belt Activity Tolerance: Patient tolerated treatment well Patient left: in chair;with call bell/phone within reach;with family/visitor present   GP     Varshini Arrants 04/19/2012,  12:02 PM Navya Timmons L. Eddison Searls DPT [phone]

## 2012-04-19 NOTE — Progress Notes (Signed)
PT PROGRESS NOTE  04/19/12 0900  PT Visit Information  Last PT Received On 04/19/12  PT/OT Co-Evaluation/Treatment Yes  PT Time Calculation  PT Start Time 0902  PT Stop Time 0948  PT Time Calculation (min) 46 min  Subjective Data  Subjective I will try  Precautions  Precautions None  Restrictions  Weight Bearing Restrictions No  RLE Weight Bearing WBAT  Other Position/Activity Restrictions No ROM restirctions .   Cognition  Overall Cognitive Status Appears within functional limits for tasks assessed/performed  Arousal/Alertness Awake/alert  Orientation Level Oriented X4 / Intact  Behavior During Session [facility] for tasks performed  Bed Mobility  Bed Mobility Supine to Sit;Sitting - Scoot to Edge of Bed  Supine to Sit 4: Min assist;HOB flat  Supine to Sit: Patient Percentage 80%  Sitting - Scoot to Edge of Bed 3: Mod assist  Sitting - Scoot to Edge of Bed: Patient Percentage 70%  Details for Bed Mobility Assistance Step by step cueing for sequencing, assist for R LE secondary to pain and weakness, Manual facilitation to stabilize R LE to minimize knee flexion secondary to pain, Assist to support R LE and verbal cues to scoot to EOB.l  Transfers  Transfers Sit to Stand;Stand to Sit;Stand Pivot Transfers  Sit to Stand 1: +2 Total assist;From elevated surface;With upper extremity assist;From bed  Sit to Stand: Patient Percentage 40%  Stand to Sit 1: +2 Total assist;To chair/3-in-1;With upper extremity assist  Stand to Sit: Patient Percentage 50%  Stand Pivot Transfers 2: Max assist  Details for Transfer Assistance Pt first attempt to stand from low surface unsuccessful despite +2 assist.  Elevated bed and pt able to complete transfer with +2 total assist, manual faciilitation for anterior wt shift, verbal and tactile cues to place hands on the bed and position R LE to minimize pain.    Ambulation/Gait  Ambulation/Gait Assistance 1: +1 Total assist  Ambulation Distance (Feet) 4 Feet    Assistive device Rolling walker  Ambulation/Gait Assistance Details Pt requied assistance to suport her body wt while pt advanced L LE.  Pt required repeated step by step verbal cueing, assistance to manage RW and tactile cues to shift wt forward and decrease posterior lean.    Gait Pattern Step-to pattern;Decreased hip/knee flexion - right;Decreased hip/knee flexion - left;Decreased stance time - right;Decreased step length - right;Decreased step length - left;Decreased weight shift to right;Right flexed knee in stance;Festinating;Trunk flexed;Narrow base of support  Gait velocity excessively slow.  long pause between each step   General Gait Details Pain appears to be primary limiting factor.  Pt fearful of WB on R LE.   Stairs No  Engineering geologist No  Balance  Balance Assessed Yes  Static Sitting Balance  Static Sitting - Balance Support No upper extremity supported;Feet supported  Static Sitting - Level of Assistance 5: Stand by assistance  Static Sitting - Comment/# of Minutes no lob sitting on EOB dressing with OT.    Total Joint Exercises  Ankle Circles/Pumps Both;10 reps  Quad Sets Right;5 reps;Supine  Heel Slides 5 reps;Right;Supine;AAROM  PT - End of Session  Equipment Utilized During Treatment Gait belt  Activity Tolerance Patient tolerated treatment well  Patient left in chair;with call bell/phone within reach;with family/visitor present  PT - Assessment/Plan  Comments on Treatment Session Pt is a 78 y/o female s/p I & D of infected R knee.  Pt presents with signiticant pain and weakness limiting pt mobility. Pt lives with spouse and has  a paid caregiver one day per week for 4 hours (mostly for housekeeping).  At this time pt requires more assistance than she would have availiable at home and would benefit from continued PT in CIR setting.  Acute PT will continue to follow and progress pt.    PT Plan Frequency needs to be updated;Discharge plan needs to  be updated  PT Frequency Min 5X/week  Recommendations for Other Services Rehab consult  Follow Up Recommendations Inpatient Rehab;Supervision/Assistance - 24 hour (24 hour SKILLED assistance. )  Equipment Recommended None recommended by PT   Acute Rehab PT Goals  PT Goal Formulation  With patient/family  Time For Goal Achievement  05/03/12  Potential to Achieve Goals  Good  Pt will go Supine/Side to Sit  with supervision;with HOB 0 degrees  PT Goal: Supine/Side to Sit - Progress  Goal set today  Pt will go Sit to Supine/Side  with supervision;with HOB 0 degrees  PT Goal: Sit to Supine/Side - Progress  Goal set today  Pt will go Sit to Stand with supervision;with upper extremity assist;from elevated surface  PT Goal: Sit to Stand - Progress  Goal set today  Pt will go Stand to Sit  with supervision;with upper extremity assist  PT Goal: Stand to Sit - Progress  Goal set today  Pt will Transfer Bed to Chair/Chair to Bed  with min assist  PT Transfer Goal: Bed to Chair/Chair to Bed - Progress  Goal set today  Pt will Ambulate  16 - 50 feet;with supervision;with rolling walker  PT Goal: Ambulate - Progress Goal set today      PT General Charges  $$ ACUTE PT VISIT 1 Procedure  PT Treatments  $Therapeutic Activity 38-52 mins   Jakyrah Holladay L. Rozella Servello DPT [phone]

## 2012-04-19 NOTE — Consult Note (Signed)
Physical Medicine and Rehabilitation Consult Reason for Consult: Septic Knee/deconditioning Referring Physician:  Dr. Sherlean Foot.   HPI: [name] is a 78 y.o. female with history of HTN, breat cancer, R knee meniscectomy on 04-11-12. She returns on 9-24 with worsening pain that was unrelieved with OTC pain medications. States that her knee became swollen, tender. She underwent aspiration of her knee at her MD's office and was found to have blood and pustulant material. She was adm 9-24 and underwent I & D of her R knee. Is WBAT and no ROM restrictions. Gram stain showed GPC pairs. Wound cultures with moderate staph/pending. ID following and recommends IV ancef X6 weeks. Evaluated by therapy and CIR recommended for progression.   Review of Systems  HENT: Negative for hearing loss and neck pain.   Eyes: Negative for blurred vision and double vision.  Respiratory: Negative for cough and shortness of breath.   Cardiovascular: Negative for chest pain.  Gastrointestinal: Positive for constipation (since surgery).  Genitourinary:       Retention since surgery  Musculoskeletal: Positive for joint pain. Negative for back pain.  Neurological: Positive for tingling. Negative for headaches.  Psychiatric/Behavioral: The patient has insomnia.    Past Medical History  Diagnosis Date  . Hypertension   . High cholesterol   . Complication of anesthesia 04/18/2012    "didn't tolerate it today very well; had the shakes and very hard time w/it"  . Breast cancer   . Arthritis     "in my back"  . Depression   . Osteoporosis    Past Surgical History  Procedure Date  . Knee arthroscopy 04/17/2012    Procedure: ARTHROSCOPY KNEE;  Surgeon: Raymon Mutton, MD;  Location: [facility] OR;  Service: Orthopedics;  Laterality: Right;  . I&d extremity 04/17/2012    Procedure: IRRIGATION AND DEBRIDEMENT EXTREMITY;  Surgeon: Raymon Mutton, MD;  Location: [facility] OR;  Service: Orthopedics;  Laterality: Right;  . Tonsillectomy and  adenoidectomy     "I was a child"  . Appendectomy   . Cholecystectomy   . Posterior laminectomy / decompression lumbar spine 1980's  . Mastectomy 1982    left  . Breast lumpectomy     left  . Breast biopsy     left  . Menisectomy 04/11/2012    right   Family History  Problem Relation Age of Onset  . Kidney failure Mother    Social History:  Married. reports that she has never smoked. She has never used smokeless tobacco. She reports that she drinks alcohol. She reports that she does not use illicit drugs.  Allergies  Allergen Reactions  . Oysters (Shellfish Allergy) Nausea And Vomiting  . Codeine Nausea And Vomiting   Medications Prior to Admission  Medication Sig Dispense Refill  . celecoxib (CELEBREX) 200 MG capsule Take 200 mg by mouth 2 (two) times daily.      . Cholecalciferol (VITAMIN D3) 2000 UNITS TABS Take 2 tablets by mouth every morning.      . escitalopram (LEXAPRO) 10 MG tablet Take 10 mg by mouth every morning.       . Multiple Vitamin (MULTIVITAMIN) tablet Take 1 tablet by mouth every morning.       . simvastatin (ZOCOR) 20 MG tablet Take 20 mg by mouth every morning.       . Teriparatide, Recombinant, (FORTEO) 600 MCG/2.4ML SOLN Inject 20 mcg into the skin daily.      Marland Kitchen triamterene-hydrochlorothiazide (MAXZIDE) 75-50 MG per tablet Take 1  tablet by mouth daily.        Home: Home Living Lives With: Spouse Available Help at Discharge: Personal care attendant Type of Home: House Home Access: Stairs to enter [employer] of Steps: 3 Entrance Stairs-Rails: Left Home Layout: One level;Able to live on main level with bedroom/bathroom Bathroom Shower/Tub: Walk-in shower;Door Teacher, early years/pre: Yes How Accessible: Accessible via walker Home Adaptive Equipment: Bedside commode/3-in-1;Walker - rolling  Functional History: Prior Function Able to Take Stairs?: Yes Functional Status:  Mobility: Bed Mobility Bed Mobility:  Sit to Supine Supine to Sit: Not tested (comment) Supine to Sit: Patient Percentage: 40% Sitting - Scoot to Edge of Bed: Not tested (comment) Sitting - Scoot to Edge of Bed: Patient Percentage: 70% Sit to Supine: 2: Max assist;HOB flat Sit to Supine: Patient Percentage: 40% Transfers Transfers: Sit to Stand;Stand to [employer] (two trials of each. ) Sit to Stand: 1: +2 Total assist;With upper extremity assist;From chair/3-in-1;With armrests (1 from recliner 1 from 3 in1 in high position. ) Sit to Stand: Patient Percentage: 30% Stand to Sit: 1: +2 Total assist;With upper extremity assist;To elevated surface;With armrests;To bed;To chair/3-in-1 (1 to 3 in 1, 1 to bed. ) Stand to Sit: Patient Percentage: 40% Stand Pivot Transfers: 1: +2 Total assist Stand Pivot Transfers: Patient Percentage: 30% Ambulation/Gait Ambulation/Gait Assistance: Not tested (comment) Ambulation Distance (Feet): 4 Feet Assistive device: Rolling walker Ambulation/Gait Assistance Details: Pt requied assistance to suport her body wt while pt advanced L LE.  Pt required repeated step by step verbal cueing, assistance to manage RW and tactile cues to shift wt forward and decrease posterior lean.   Gait Pattern: Step-to pattern;Decreased hip/knee flexion - right;Decreased hip/knee flexion - left;Decreased stance time - right;Decreased step length - right;Decreased step length - left;Decreased weight shift to right;Right flexed knee in stance;Festinating;Trunk flexed;Narrow base of support Gait velocity: excessively slow.  long pause between each step  General Gait Details: Pain appears to be primary limiting factor.  Pt fearful of WB on R LE.  Stairs: No Wheelchair Mobility Wheelchair Mobility: No  ADL: ADL Lower Body Bathing: Simulated;Minimal assistance Where Assessed - Lower Body Bathing: Unsupported sitting Upper Body Dressing: Performed;Supervision/safety Where Assessed - Upper Body Dressing:  Supported sitting Lower Body Dressing: Performed;Moderate assistance Where Assessed - Lower Body Dressing: Supported sit to stand Toilet Transfer: Mining engineer Method: Sit to Barista: Raised toilet seat without arms Equipment Used: Gait belt;Rolling walker;Reacher Transfers/Ambulation Related to ADLs: Pt. required cues for hand placement while performing sit<>stand transfer. Pt. required bed to be elevated to complete transfer.  ADL Comments: Pt. educated on AE for LB dressing able to perform LB dressing with use of reacher to don underwear.   Cognition: Cognition Arousal/Alertness: Awake/alert Orientation Level: Oriented to person;Oriented to place;Oriented to situation Cognition Overall Cognitive Status: Appears within functional limits for tasks assessed/performed Arousal/Alertness: Awake/alert Orientation Level: Oriented X4 / Intact Behavior During Session: [facility] for tasks performed Cognition - Other Comments: Pt agitated  Blood pressure 124/68, pulse 88, temperature 98.6 F (37 C), temperature source Oral, resp. rate 18, SpO2 96.00%. Physical Exam  Nursing note and vitals reviewed. Constitutional: She is oriented to person, place, and time. She appears well-developed and well-nourished.  HENT:  Head: Normocephalic and atraumatic.  Eyes: Pupils are equal, round, and reactive to light.  Neck: Normal range of motion.  Cardiovascular: Normal rate and regular rhythm.   Pulmonary/Chest: Effort normal and breath sounds normal.  Abdominal: Soft. Bowel  sounds are normal.  Musculoskeletal:       Pain with attempts at ROM RLE.  Neurological: She is alert and oriented to person, place, and time.  Skin: Skin is warm and dry.    Results for orders placed during the hospital encounter of 04/17/12 (from the past 24 hour(s))  SEDIMENTATION RATE     Status: Abnormal   Collection Time   04/18/12  5:09 PM      Component Value Range    Sed Rate 105 (*) 0 - 22 mm/hr  C-REACTIVE PROTEIN     Status: Abnormal   Collection Time   04/18/12  5:09 PM      Component Value Range   CRP 32.8 (*) <0.60 mg/dL  CBC     Status: Abnormal   Collection Time   04/19/12  6:05 AM      Component Value Range   WBC 8.4  4.0 - 10.5 K/uL   RBC 3.74 (*) 3.87 - 5.11 MIL/uL   Hemoglobin 11.8 (*) 12.0 - 15.0 g/dL   HCT 16.1 (*) 09.6 - 04.5 %   MCV 93.0  78.0 - 100.0 fL   MCH 31.6  26.0 - 34.0 pg   MCHC 33.9  30.0 - 36.0 g/dL   RDW 40.9  81.1 - 91.4 %   Platelets 182  150 - 400 K/uL   Dg Chest Port 1 View  04/17/2012  *RADIOLOGY REPORT*  Clinical Data: Preoperative assessment for irrigation and debridement, history hypertension, breast cancer  PORTABLE CHEST - 1 VIEW  Comparison: Portable exam 1611 hours compared to 07/16/2005  Findings: Mild kyphotic positioning with slight rotation to the right. Enlargement of cardiac silhouette. Vascular congestion. Tortuous aorta. Question enlargement of right hilum. Chronic peribronchial thickening. Increased accentuation of perihilar markings since previous exam could be related to mild infiltrate or edema. Minimal bibasilar atelectasis. No pleural effusion or pneumothorax. Osseous demineralization. Surgical clips left axilla.  IMPRESSION: Enlargement of cardiac silhouette with pulmonary vascular congestion. Bronchitic changes with increased perihilar markings, cannot exclude minimal edema or infection. Mild bibasilar atelectasis. Question right hilar enlargement versus artifact from rotation; recommend follow-up upright PA and lateral chest radiographs when the patient's clinical condition permits for further assessment to exclude adenopathy.   Original Report Authenticated By: Lollie Marrow, M.D.     Assessment/Plan: Diagnosis:  Septic  Knee s/p I & D  For SNF today. Will defer formal consult.  Ivory Broad, MD 04/19/2012

## 2012-04-19 NOTE — Progress Notes (Signed)
Occupational Therapy Evaluation Patient Details Name: [name] MRN: [id number] DOB: 01-22-1932 Today's Date: 04/19/2012 Time: 0912-0929 OT Time Calculation (min): 17 min  OT Assessment / Plan / Recommendation Clinical Impression  Pt. 78 yo female s/p right TKA. Pt. was pleasant and cooperative. Would benefit from OT acutely to increase independence with ADL's     OT Assessment  Patient needs continued OT Services    Follow Up Recommendations  Supervision/Assistance - 24 hour    Barriers to Discharge      Equipment Recommendations  None recommended by OT    Recommendations for Other Services    Frequency  Min 2X/week    Precautions / Restrictions Precautions Precautions: None Restrictions Weight Bearing Restrictions: No RLE Weight Bearing: Weight bearing as tolerated Other Position/Activity Restrictions: No ROM restirctions .    Pertinent Vitals/Pain None stated   ADL  Lower Body Bathing: Simulated;Minimal assistance Where Assessed - Lower Body Bathing: Unsupported sitting Upper Body Dressing: Performed;Supervision/safety Where Assessed - Upper Body Dressing: Supported sitting Lower Body Dressing: Performed;Moderate assistance Where Assessed - Lower Body Dressing: Supported sit to stand Toilet Transfer: Mining engineer Method: Sit to Barista: Raised toilet seat without arms Equipment Used: Gait belt;Rolling walker;Reacher Transfers/Ambulation Related to ADLs: Pt. required cues for hand placement while performing sit<>stand transfer. Pt. required bed to be elevated to complete transfer.  ADL Comments: Pt. educated on AE for LB dressing able to perform LB dressing with use of reacher to don underwear.     OT Diagnosis: Generalized weakness;Acute pain  OT Problem List: Decreased activity tolerance;Decreased knowledge of use of DME or AE;Pain OT Treatment Interventions: Self-care/ADL training;Therapeutic  exercise;DME and/or AE instruction;Patient/family education   OT Goals Acute Rehab OT Goals OT Goal Formulation: With patient Time For Goal Achievement: 05/02/12 Potential to Achieve Goals: Good ADL Goals Pt Will Perform Grooming: with supervision;Standing at sink ADL Goal: Grooming - Progress: Goal set today Pt Will Perform Lower Body Dressing: with supervision;Sit to stand from chair;Supported ADL Goal: Lower Body Dressing - Progress: Goal set today Pt Will Transfer to Toilet: with supervision;Ambulation;3-in-1 ADL Goal: Toilet Transfer - Progress: Goal set today Pt Will Perform Tub/Shower Transfer: Shower transfer;Ambulation ADL Goal: Tub/Shower Transfer - Progress: Goal set today  Visit Information  Last OT Received On: 04/19/12    Subjective Data  Subjective: This is my first time out of bed    Prior Functioning     Home Living Lives With: Spouse Available Help at Discharge: Personal care attendant Type of Home: House Home Access: Stairs to enter [employer] of Steps: 3 Entrance Stairs-Rails: Left Home Layout: One level;Able to live on main level with bedroom/bathroom Bathroom Shower/Tub: Walk-in shower;Door [employer] Toilet: Pharmacist, community: Yes How Accessible: Accessible via walker Home Adaptive Equipment: Bedside commode/3-in-1;Walker - rolling Prior Function Level of Independence: Independent Able to Take Stairs?: Yes Communication Communication: No difficulties Dominant Hand: Right              Cognition  Overall Cognitive Status: Appears within functional limits for tasks assessed/performed Arousal/Alertness: Awake/alert Orientation Level: Oriented X4 / Intact Behavior During Session: [facility] for tasks performed    Extremity/Trunk Assessment Right Upper Extremity Assessment RUE ROM/Strength/Tone: [facility] for tasks assessed Left Upper Extremity Assessment LUE ROM/Strength/Tone: WFL for tasks assessed     Mobility Bed  Mobility Bed Mobility: Supine to Sit;Sitting - Scoot to Edge of Bed Supine to Sit: 4: Min guard;With rails Sitting - Scoot to Edge of Bed: 4: Min  guard;With rail Details for Bed Mobility Assistance: (A) with supporting RLE  Transfers Transfers: Sit to Stand;Stand to Sit Sit to Stand: 4: Min assist;From elevated surface;From bed Stand to Sit: 4: Min assist;With upper extremity assist;To elevated surface;To bed Details for Transfer Assistance: Cues for safest hand placement on RW                Balance Balance Balance Assessed: Yes Static Sitting Balance Static Sitting - Balance Support: No upper extremity supported;Feet supported Static Sitting - Level of Assistance: 5: Stand by assistance Static Sitting - Comment/# of Minutes: no lob sitting on EOB dressing with OT.     End of Session OT - End of Session Equipment Utilized During Treatment: Gait belt Activity Tolerance: Patient tolerated treatment well Patient left: in bed;with call bell/phone within reach;Other (comment) (with PT in room)  GO     Cleora Fleet 04/19/2012, 10:13 AM

## 2012-04-19 NOTE — Progress Notes (Signed)
INFECTIOUS DISEASE PROGRESS NOTE  ID: [name] is a 78 y.o. female with  Active Problems:  * No active hospital problems. *   Subjective: Resting queitly Abtx:  Anti-infectives     Start     Dose/Rate Route Frequency Ordered Stop   04/18/12 1345   vancomycin (VANCOCIN) IVPB 1000 mg/200 mL premix        1,000 mg 200 mL/hr over 60 Minutes Intravenous Every 12 hours 04/18/12 1336 04/20/12 0144   04/17/12 2030   vancomycin (VANCOCIN) IVPB 1000 mg/200 mL premix        1,000 mg 200 mL/hr over 60 Minutes Intravenous Every 12 hours 04/17/12 1908 04/17/12 2230          Medications:  Scheduled:   . aspirin EC  325 mg Oral Q breakfast  . celecoxib  200 mg Oral BID  . docusate sodium  100 mg Oral BID  . escitalopram  10 mg Oral q morning - 10a  . simvastatin  20 mg Oral q morning - 10a  . Teriparatide (Recombinant)  20 mcg Subcutaneous Daily  . triamterene-hydrochlorothiazide  1 tablet Oral Daily  . vancomycin  1,000 mg Intravenous Q12H    Objective: Vital signs in last 24 hours: Temp:  [98.5 F (36.9 C)-98.6 F (37 C)] 98.6 F (37 C) (09/26 0654) Pulse Rate:  [88] 88  (09/26 0654) Resp:  [16-18] 18  (09/26 1200) BP: (124)/(45-68) 124/68 mmHg (09/26 0654) SpO2:  [95 %-97 %] 96 % (09/26 0654)   General appearance: fatigued Extremities: R knee wrapped  Lab Results  Basename 04/19/12 0605 04/18/12 0547 04/17/12 1150  WBC 8.4 9.2 --  HGB 11.8* 11.8* --  HCT 34.8* 35.0* --  NA -- 138 135  K -- 3.6 3.4*  CL -- 96 94*  CO2 -- 29 29  BUN -- 27* 29*  CREATININE -- 0.73 0.83  GLU -- -- --   Liver Panel  Basename 04/17/12 1150  PROT 7.5  ALBUMIN 3.1*  AST 36  ALT 29  ALKPHOS 115  BILITOT 1.2  BILIDIR --  IBILI --   Sedimentation Rate  Basename 04/18/12 1709  ESRSEDRATE 105*   C-Reactive Protein  Basename 04/18/12 1709 04/17/12 1150  CRP 32.8* 52.6*    Microbiology: Recent Results (from the past 240 hour(s))  WOUND CULTURE     Status: Normal  (Preliminary result)   Collection Time   04/17/12  5:42 PM      Component Value Range Status Comment   Specimen Description WOUND KNEE RIGHT   Final    Special Requests NONE   Final    Gram Stain     Final    Value: MODERATE WBC PRESENT, PREDOMINANTLY PMN     NO SQUAMOUS EPITHELIAL CELLS SEEN     FEW GRAM POSITIVE COCCI     IN PAIRS   Culture     Final    Value: MODERATE STAPHYLOCOCCUS AUREUS     Note: RIFAMPIN AND GENTAMICIN SHOULD NOT BE USED AS SINGLE DRUGS FOR TREATMENT OF STAPH INFECTIONS.   Report Status PENDING   Incomplete   ANAEROBIC CULTURE     Status: Normal (Preliminary result)   Collection Time   04/17/12  5:42 PM      Component Value Range Status Comment   Specimen Description WOUND KNEE RIGHT   Final    Special Requests NONE   Final    Gram Stain PENDING   Incomplete    Culture  Final    Value: NO ANAEROBES ISOLATED; CULTURE IN PROGRESS FOR 5 DAYS   Report Status PENDING   Incomplete   URINE CULTURE     Status: Normal   Collection Time   04/18/12  3:09 AM      Component Value Range Status Comment   Specimen Description URINE, CATHETERIZED   Final    Special Requests NONE   Final    Culture  Setup Time 04/18/2012 08:28   Final    Colony Count NO GROWTH   Final    Culture NO GROWTH   Final    Report Status 04/19/2012 FINAL   Final     Studies/Results: Dg Chest Port 1 View  04/17/2012  *RADIOLOGY REPORT*  Clinical Data: Preoperative assessment for irrigation and debridement, history hypertension, breast cancer  PORTABLE CHEST - 1 VIEW  Comparison: Portable exam 1611 hours compared to 07/16/2005  Findings: Mild kyphotic positioning with slight rotation to the right. Enlargement of cardiac silhouette. Vascular congestion. Tortuous aorta. Question enlargement of right hilum. Chronic peribronchial thickening. Increased accentuation of perihilar markings since previous exam could be related to mild infiltrate or edema. Minimal bibasilar atelectasis. No pleural effusion  or pneumothorax. Osseous demineralization. Surgical clips left axilla.  IMPRESSION: Enlargement of cardiac silhouette with pulmonary vascular congestion. Bronchitic changes with increased perihilar markings, cannot exclude minimal edema or infection. Mild bibasilar atelectasis. Question right hilar enlargement versus artifact from rotation; recommend follow-up upright PA and lateral chest radiographs when the patient's clinical condition permits for further assessment to exclude adenopathy.   Original Report Authenticated By: Lollie Marrow, M.D.      Assessment/Plan:  Wound Infection/Septic Joint Day 2 vancomycin Cx Staph aureus- MSSA- R- FLQ, Clinda, Erythro Would- change her to ancef. Plan for 6 weeks, see back in ID clinic in 5-6 weeks.  PIC line... availble if questions  Johny Sax Infectious Diseases [phone] 04/19/2012, 2:39 PM   LOS: 2 days

## 2012-04-19 NOTE — Progress Notes (Signed)
Nutrition Brief Note  Patient identified on the Malnutrition Screening Tool (MST) report for wt loss, generating a score of 2.   Current diet order is Regular, patient is consuming approximately 35-85% of meals at this time. Labs and medications reviewed.   Pt states that she was unable to eat "for a day or two" PTA because she did not want to move or go to the bathroom due to knee pain.  She thinks she may have lost some wt because of this.  Pt states her appetite is normal and is looking forward to being able to eat.  RD obtained wt via bedscale at 181 lbs.  Pt states her usual wt is 150-155 lbs.  Not likely accurate.   No nutrition interventions warranted at this time. If nutrition issues arise, please consult RD.   Loyce Dys, MS RD LDN Clinical Inpatient Dietitian Pager: [phone] Weekend/After hours pager: [phone]

## 2012-04-19 NOTE — Progress Notes (Signed)
CARE MANAGEMENT NOTE 04/19/2012  Patient:  [name], [name]   Account Number:  [id number]  Date Initiated:  04/19/2012  Documentation initiated by:  Vance Peper  Subjective/Objective Assessment:   78 yr old female s/p I & D of right Knee     Action/Plan:   CM spoke with patient and daughter regarding HH needs. Patient setup with [facility]. Per Physical therapist, patient will need SNF for shortterm therapy. Social worker Animal nutritionist notified.   Anticipated DC Date:  04/20/2012   Anticipated DC Plan:  SKILLED NURSING FACILITY  In-house referral  Clinical Social Worker      DC Planning Services  CM consult      Choice offered to / List presented to:  C-1 Patient           Status of service:  Completed, signed off Medicare Important Message given?   (If response is "NO", the following Medicare IM given date fields will be blank) Date Medicare IM given:   Date Additional Medicare IM given:    Discharge Disposition:  SKILLED NURSING FACILITY  Per UR Regulation:    If discussed at Long Length of Stay Meetings, dates discussed:    Comments:

## 2012-04-19 NOTE — Consult Note (Signed)
Reason for Consult:Urinary Retention Referring Physician: Lucey - ortho  [name] is an 78 y.o. female.  HPI:   1 - Urinary Retention - pt presently admitted with infected joint s/p arthrosocopy and now with PICC and IV ABX with persistent urinary retention. Has failed trial of void x 3 in house this admission. No prior episodes. Non-diabetic. No LE neuropathy. Does have h/o spine surgery in 1980s but denies bowel of bladder issues. Of note, she has had not had bowel movement in several days and does feel distended.  No prior GU eval or surgery. No baseline voiding complaints.  Past Medical History  Diagnosis Date  . Hypertension   . High cholesterol   . Complication of anesthesia 04/18/2012    "didn't tolerate it today very well; had the shakes and very hard time w/it"  . Breast cancer   . Arthritis     "in my back"  . Depression   . Osteoporosis     Past Surgical History  Procedure Date  . Knee arthroscopy 04/17/2012    Procedure: ARTHROSCOPY KNEE;  Surgeon: Raymon Mutton, MD;  Location: [facility] OR;  Service: Orthopedics;  Laterality: Right;  . I&d extremity 04/17/2012    Procedure: IRRIGATION AND DEBRIDEMENT EXTREMITY;  Surgeon: Raymon Mutton, MD;  Location: [facility] OR;  Service: Orthopedics;  Laterality: Right;  . Tonsillectomy and adenoidectomy     "I was a child"  . Appendectomy   . Cholecystectomy   . Posterior laminectomy / decompression lumbar spine 1980's  . Mastectomy 1982    left  . Breast lumpectomy     left  . Breast biopsy     left  . Menisectomy 04/11/2012    right    Family History  Problem Relation Age of Onset  . Kidney failure Mother     Social History:  reports that she has never smoked. She has never used smokeless tobacco. She reports that she drinks alcohol. She reports that she does not use illicit drugs.  Allergies:  Allergies  Allergen Reactions  . Oysters (Shellfish Allergy) Nausea And Vomiting  . Codeine Nausea And Vomiting     Medications: I have reviewed the patient's current medications.  Results for orders placed during the hospital encounter of 04/17/12 (from the past 48 hour(s))  WOUND CULTURE     Status: Normal (Preliminary result)   Collection Time   04/17/12  5:42 PM      Component Value Range Comment   Specimen Description WOUND KNEE RIGHT      Special Requests NONE      Gram Stain        Value: MODERATE WBC PRESENT, PREDOMINANTLY PMN     NO SQUAMOUS EPITHELIAL CELLS SEEN     FEW GRAM POSITIVE COCCI     IN PAIRS   Culture        Value: MODERATE STAPHYLOCOCCUS AUREUS     Note: RIFAMPIN AND GENTAMICIN SHOULD NOT BE USED AS SINGLE DRUGS FOR TREATMENT OF STAPH INFECTIONS.   Report Status PENDING     ANAEROBIC CULTURE     Status: Normal (Preliminary result)   Collection Time   04/17/12  5:42 PM      Component Value Range Comment   Specimen Description WOUND KNEE RIGHT      Special Requests NONE      Gram Stain PENDING      Culture        Value: NO ANAEROBES ISOLATED; CULTURE IN PROGRESS FOR 5 DAYS  Report Status PENDING     URINALYSIS, ROUTINE W REFLEX MICROSCOPIC     Status: Abnormal   Collection Time   04/18/12  3:09 AM      Component Value Range Comment   Color, Urine AMBER (*) YELLOW BIOCHEMICALS MAY BE AFFECTED BY COLOR   APPearance CLOUDY (*) CLEAR    Specific Gravity, Urine 1.028  1.005 - 1.030    pH 5.5  5.0 - 8.0    Glucose, UA NEGATIVE  NEGATIVE mg/dL    Hgb urine dipstick MODERATE (*) NEGATIVE    Bilirubin Urine SMALL (*) NEGATIVE    Ketones, ur NEGATIVE  NEGATIVE mg/dL    Protein, ur 811 (*) NEGATIVE mg/dL    Urobilinogen, UA 1.0  0.0 - 1.0 mg/dL    Nitrite NEGATIVE  NEGATIVE    Leukocytes, UA SMALL (*) NEGATIVE   URINE CULTURE     Status: Normal   Collection Time   04/18/12  3:09 AM      Component Value Range Comment   Specimen Description URINE, CATHETERIZED      Special Requests NONE      Culture  Setup Time 04/18/2012 08:28      Colony Count NO GROWTH      Culture  NO GROWTH      Report Status 04/19/2012 FINAL     URINE MICROSCOPIC-ADD ON     Status: Abnormal   Collection Time   04/18/12  3:09 AM      Component Value Range Comment   Squamous Epithelial / LPF FEW (*) RARE    WBC, UA 3-6  <3 WBC/hpf    RBC / HPF 7-10  <3 RBC/hpf    Casts GRANULAR CAST (*) NEGATIVE    Urine-Other RARE YEAST     CBC     Status: Abnormal   Collection Time   04/18/12  5:47 AM      Component Value Range Comment   WBC 9.2  4.0 - 10.5 K/uL    RBC 3.74 (*) 3.87 - 5.11 MIL/uL    Hemoglobin 11.8 (*) 12.0 - 15.0 g/dL    HCT 91.4 (*) 78.2 - 46.0 %    MCV 93.6  78.0 - 100.0 fL    MCH 31.6  26.0 - 34.0 pg    MCHC 33.7  30.0 - 36.0 g/dL    RDW 95.6  21.3 - 08.6 %    Platelets 172  150 - 400 K/uL   BASIC METABOLIC PANEL     Status: Abnormal   Collection Time   04/18/12  5:47 AM      Component Value Range Comment   Sodium 138  135 - 145 mEq/L    Potassium 3.6  3.5 - 5.1 mEq/L    Chloride 96  96 - 112 mEq/L    CO2 29  19 - 32 mEq/L    Glucose, Bld 101 (*) 70 - 99 mg/dL    BUN 27 (*) 6 - 23 mg/dL    Creatinine, Ser 5.78  0.50 - 1.10 mg/dL    Calcium 9.3  8.4 - 46.9 mg/dL    GFR calc non Af Amer 80 (*) >90 mL/min    GFR calc Af Amer >90  >90 mL/min   SEDIMENTATION RATE     Status: Abnormal   Collection Time   04/18/12  5:09 PM      Component Value Range Comment   Sed Rate 105 (*) 0 - 22 mm/hr   C-REACTIVE PROTEIN  Status: Abnormal   Collection Time   04/18/12  5:09 PM      Component Value Range Comment   CRP 32.8 (*) <0.60 mg/dL   CBC     Status: Abnormal   Collection Time   04/19/12  6:05 AM      Component Value Range Comment   WBC 8.4  4.0 - 10.5 K/uL    RBC 3.74 (*) 3.87 - 5.11 MIL/uL    Hemoglobin 11.8 (*) 12.0 - 15.0 g/dL    HCT 96.2 (*) 95.2 - 46.0 %    MCV 93.0  78.0 - 100.0 fL    MCH 31.6  26.0 - 34.0 pg    MCHC 33.9  30.0 - 36.0 g/dL    RDW 84.1  32.4 - 40.1 %    Platelets 182  150 - 400 K/uL     No results found.  Review of Systems   Constitutional: Negative.   HENT: Negative.   Eyes: Negative.   Respiratory: Negative.   Cardiovascular: Negative.   Gastrointestinal: Positive for constipation.  Genitourinary: Positive for hematuria. Negative for dysuria, urgency and flank pain.  Musculoskeletal: Negative.   Skin: Negative.   Neurological: Negative.   Endo/Heme/Allergies: Negative.   Psychiatric/Behavioral: Negative.    Blood pressure 106/47, pulse 71, temperature 97.6 F (36.4 C), temperature source Oral, resp. rate 16, height 5\' 6"  (1.676 m), weight 71.668 kg (158 lb), SpO2 93.00%. Physical Exam  Constitutional: She is oriented to person, place, and time. She appears well-developed and well-nourished.       Vigorous for age  HENT:  Head: Normocephalic and atraumatic.  Eyes: EOM are normal. Pupils are equal, round, and reactive to light.  Neck: Normal range of motion. Neck supple.  Cardiovascular: Normal rate and regular rhythm.   GI: Soft. Bowel sounds are normal. She exhibits distension. There is no rebound and no guarding.  Genitourinary: Vagina normal.       Foley c/d/i with yellow urine. No prolapse with valsalva.  Musculoskeletal: Normal range of motion.  Neurological: She is alert and oriented to person, place, and time.  Skin: Skin is warm and dry.  Psychiatric: She has a normal mood and affect. Her behavior is normal. Judgment and thought content normal.    Assessment/Plan: 1 - Urinary Retention - most likely combination of pain meds / anticholiergics in the hospital combined with significant constipation. No obvious stigmata suggesting acute onset of  neurogenic bladder. This will likely be transient. I RX'd ducolax SPP x1 and begin senna BID.  Would try trial of void one more time while in house after she has meaningful bowel function.  If fails again, foley will need to stay in so that we may perform more dedicated eval in our office setting, as we do not have the ability to evaulate further in the  hospital setting.  I have explained this plan to the patient, and she voiced understanding.  2 - Will follow.   3 - Please feel free to call me directly at anytime with any urologic issues in this patient. [phone] pgr  Landon Bassford 04/19/2012, 5:06 PM

## 2012-04-19 NOTE — Clinical Social Work Psychosocial (Signed)
     Clinical Social Work Department BRIEF PSYCHOSOCIAL ASSESSMENT 04/19/2012  Patient:  [name], [name]     Account Number:  [id number]     Admit date:  04/17/2012  Clinical Social Worker:  Tiburcio Pea  Date/Time:  04/19/2012 04:23 PM  Referred by:  Physician  Date Referred:  04/19/2012 Referred for  SNF Placement   Other Referral:   Interview type:  Other - See comment Other interview type:   Patient and daughterVerlon Davis    PSYCHOSOCIAL DATA Living Status:  HUSBAND Admitted from facility:   Level of care:   Primary support name:  [name] Primary support relationship to patient:  SPOUSE Degree of support available:   Strong support  Daughter  [name]  [phone]    CURRENT CONCERNS Current Concerns  Post-Acute Placement   Other Concerns:    SOCIAL WORK ASSESSMENT / PLAN Met with patient and her daughter [name] this afternoon to discuss recommendation by PT CIR vs short term SNF. Patient lives with her husband [name] and reports that she is normally very independent of her ADLs. She states that her husband still works- and he is away from home at least 12 hours a day with his work. CIR is pt's first choice for disposition; order has been obtained for CIR review.  SNF search discussed and will be intiated as well.  Pt has Blue Medicare and Berkley Harvey will be requested.   Assessment/plan status:  Psychosocial Support/Ongoing Assessment of Needs Other assessment/ plan:   Information/referral to community resources:   SNF bed list provided    PATIENTS/FAMILYS RESPONSE TO PLAN OF CARE: Patient and daughter are agreeable to SNF search but are strongly hoping for CIR placement.  Contacted Roderic Palau, RN Liason for [employer] regarding above and CIR will assess and follow up.  Active SNF bed search is also in place.

## 2012-04-19 NOTE — Clinical Social Work Placement (Addendum)
    Clinical Social Work Department CLINICAL SOCIAL WORK PLACEMENT NOTE 04/19/2012  Patient:  [name], [name]  Account Number:  [id number] Admit date:  04/17/2012  Clinical Social Worker:  Lupita Leash Sakia Schrimpf, LCSWA  Date/time:  04/19/2012 04:40 PM  Clinical Social Work is seeking post-discharge placement for this patient at the following level of care:   SKILLED NURSING   (*CSW will update this form in Epic as items are completed)   04/19/2012  Patient/family provided with [facility] Department of Clinical Social Work's list of facilities offering this level of care within the geographic area requested by the patient (or if unable, by the patient's family).  04/19/2012  Patient/family informed of their freedom to choose among providers that offer the needed level of care, that participate in Medicare, Medicaid or managed care program needed by the patient, have an available bed and are willing to accept the patient.  04/19/2012  Patient/family informed of [facility]' ownership interest in [facility], as well as of the fact that they are under no obligation to receive care at this facility.  PASARR submitted to EDS on 04/19/2012 PASARR number received from EDS on 04/20/2012  FL2 transmitted to all facilities in geographic area requested by pt/family on  04/19/2012 FL2 transmitted to all facilities within larger geographic area on   Patient informed that his/her managed care company has contracts with or will negotiate with  certain facilities, including the following:   Blue Medicare  CM= [name] [phone]     Patient/family informed of bed offers received: 04/23/12   Patient chooses bed at [facility] Physician recommends and patient chooses bed at    Patient to be transferred to [facility] on   04/23/12 Patient to be transferred to facility by Ambulance  [facility])  The following physician request were entered in Epic:   Additional Comments: OK per MD for DC today today.  [facility]IR was not able to accept patient.  Patient and husband requested [facility] who offered a bed. They were very pleased with d/c plan. Notified SNF and pt's nurse of d/c plan.   Lorri Frederick. [place]  [phone]

## 2012-04-19 NOTE — Progress Notes (Signed)
I agree with the following treatment note after reviewing documentation.   Johnston, Ceci Taliaferro Brynn   OTR/L Pager: [phone] Office: [phone] .   

## 2012-04-20 LAB — CBC
HCT: 33 % — ABNORMAL LOW (ref 36.0–46.0)
MCV: 91.9 fL (ref 78.0–100.0)
Platelets: 190 10*3/uL (ref 150–400)
RBC: 3.59 MIL/uL — ABNORMAL LOW (ref 3.87–5.11)
RDW: 13.4 % (ref 11.5–15.5)
WBC: 7.6 10*3/uL (ref 4.0–10.5)

## 2012-04-20 LAB — WOUND CULTURE

## 2012-04-20 MED ORDER — CEFAZOLIN SODIUM-DEXTROSE 2-3 GM-% IV SOLR
2.0000 g | Freq: Three times a day (TID) | INTRAVENOUS | Status: DC
  Administered 2012-04-20 – 2012-04-23 (×9): 2 g via INTRAVENOUS
  Filled 2012-04-20 (×12): qty 50

## 2012-04-20 MED ORDER — SODIUM CHLORIDE 0.9 % IJ SOLN: 10.0000 mL | Freq: Two times a day (BID) | INTRAMUSCULAR | Status: DC

## 2012-04-20 MED ORDER — SODIUM CHLORIDE 0.9 % IJ SOLN: 10.0000 mL | INTRAMUSCULAR | Status: DC | PRN

## 2012-04-20 NOTE — Progress Notes (Signed)
Orthopedic Tech Progress Note Patient Details:  [name] 07/09/1932 [id number]  CPM Right Knee CPM Right Knee: On Right Knee Flexion (Degrees): 45  Right Knee Extension (Degrees): 0    Grover Robinson 04/20/2012, 5:00 PM

## 2012-04-20 NOTE — Op Note (Signed)
NAMELETTI, TOWELL                 ACCOUNT NO.:  [id number]  MEDICAL RECORD NO.:  [id number]  LOCATION:  5N08C                        FACILITY:  [facility]  PHYSICIAN:  Mila Homer. Sherlean Foot, M.D. DATE OF BIRTH:  [birth year]  DATE OF PROCEDURE:  04/17/2012 DATE OF DISCHARGE:                              OPERATIVE REPORT   SURGEON:  Mila Homer. Sherlean Foot, M.D.  ASSISTANT:  Altamese Cabal, PA-C  ANESTHESIA:  General.  PREOPERATIVE DIAGNOSIS:  Right knee sepsis.  POSTOP DIAGNOSIS:  Right knee sepsis.  PROCEDURE:  Right knee arthroscopic debridement.  INDICATION FOR PROCEDURE:  The patient is a 77 year old white female, 1- week status post [facility] She accumulated purulence of the knee, which grew out 24 hours prior and showed differential white blood cell count of 137,000.  Informed consent was obtained.  DESCRIPTION OF PROCEDURE:  The patient was laid supine, administered general anesthesia.  Right leg was prepped and draped in usual fashion. Inferolateral and inferomedial portals were created through the old incision points.  I then sent the purulent fluid off for additional cultures.  I then closed the arthrotomy and lavaged 6000 mL of normal saline through with great white shaver.  I removing that fluid and debriding the synovitis.  I then cauterized bleeding capsule vessels with the ArthroCare debridement wand.  I then closed with 4-0 nylon sutures.  Dressed with Xeroform dressing, sponges, sterile Webril, and Ace wrap.  COMPLICATIONS:  None.  DRAINS:  I did place one Hemovac to the inferolateral portal to at least 24 hours.  The patient was taken to the recovery room in stable condition.          ______________________________ Mila Homer Sherlean Foot, M.D.     SDL/MEDQ  D:  04/19/2012  T:  04/20/2012  Job:  [id number]

## 2012-04-20 NOTE — Progress Notes (Addendum)
Physical Therapy Treatment Patient Details Name: [name] MRN: [id number] DOB: Jun 16, 1932 Today's Date: 04/20/2012 Time: 9147-8295 PT Time Calculation (min): 38 min  PT Assessment / Plan / Recommendation Comments on Treatment Session  Pt continues to be limited by knee pain.  Pt required skilled therapist for transfers.      Follow Up Recommendations  Inpatient Rehab;Supervision/Assistance - 24 hour    Barriers to Discharge        Equipment Recommendations  None recommended by PT    Recommendations for Other Services Rehab consult  Frequency Min 5X/week   Plan Discharge plan remains appropriate;Frequency remains appropriate    Precautions / Restrictions Precautions Precautions: None Restrictions Weight Bearing Restrictions: No RLE Weight Bearing: Weight bearing as tolerated    Pertinent Vitals/Pain 10/10 pain in posterior R Knee with any movement of R LE.  Educated pt in relaxation techniques to control pain. Pt presents with limited R knee ROM 0-30 degree of flexion (AAROM), and 0-<10 degrees flexion (active). RN notified of pt's pain.      Mobility  Bed Mobility Bed Mobility: Supine to Sit;Sitting - Scoot to Edge of Bed Supine to Sit: 1: +2 Total assist;With rails;HOB flat Supine to Sit: Patient Percentage: 30% Sitting - Scoot to Edge of Bed: 1: +2 Total assist Sitting - Scoot to Edge of Bed: Patient Percentage: 30% Sit to Supine: Not Tested (comment) Details for Bed Mobility Assistance: Assist for R LE secondary to pain.  Assist to raise shoulders from bed with pt pulling on rail.   Transfers Transfers: Sit to Stand;Stand to Sit;Stand Pivot Transfers Sit to Stand: 1: +2 Total assist;From elevated surface;From bed;With upper extremity assist Sit to Stand: Patient Percentage: 40% Stand to Sit: 1: +2 Total assist;With upper extremity assist;To elevated surface;To chair/3-in-1;With armrests Stand to Sit: Patient Percentage: 30% Stand Pivot Transfers: 1: +2 Total  assist Stand Pivot Transfers: Patient Percentage: 30% Details for Transfer Assistance: Step by step cueing for technique including hand placement, position of  R LE, and scoot to edge of chair.  Assist to initiate standing secondary to weakness and pain.  Manual facilitation to manage large percentage of pt's body weight.  Step by step verbal and tactilce cueing for stand pivot transfer.  Pt required assistance to manage RW and Manual facilitation to initate pivot on LLE.  Pt having difficulty moving R LE (forward and backward), and pt unable to move L LE despite significant assistance to support and shift pt's weight secondary to pain/weakness in R LE stance.        Exercises Total Joint Exercises Heel Slides: 5 reps;Right;Supine;AAROM   PT Diagnosis:    PT Problem List:   PT Treatment Interventions:     PT Goals Acute Rehab PT Goals PT Goal Formulation: With patient/family Time For Goal Achievement: 05/03/12 Potential to Achieve Goals: Fair Pt will go Supine/Side to Sit: with supervision;with HOB 0 degrees PT Goal: Supine/Side to Sit - Progress: Not met Pt will go Sit to Supine/Side: with supervision;with HOB 0 degrees Pt will go Sit to Stand: with supervision;with upper extremity assist;from elevated surface PT Goal: Sit to Stand - Progress: Not met Pt will go Stand to Sit: with supervision;with upper extremity assist PT Goal: Stand to Sit - Progress: Not met Pt will Transfer Bed to Chair/Chair to Bed: with min assist PT Transfer Goal: Bed to Chair/Chair to Bed - Progress: Not met  Visit Information  Last PT Received On: 04/20/12 Assistance Needed: +2    Subjective Data  Subjective: I need to use the bathroom Patient Stated Goal: none stated.    Cognition  Overall Cognitive Status: Appears within functional limits for tasks assessed/performed Arousal/Alertness: Awake/alert Orientation Level: Oriented X4 / Intact Behavior During Session: WFL for tasks performed    Balance   Balance Balance Assessed: Yes Static Sitting Balance Static Sitting - Balance Support: Bilateral upper extremity supported Static Sitting - Level of Assistance: 5: Stand by assistance Static Sitting - Comment/# of Minutes: No LOB sitting on  3 in 1 or on EOB.    End of Session PT - End of Session Equipment Utilized During Treatment: Gait belt Activity Tolerance: Patient limited by fatigue;Patient limited by pain   GP     Megan Presti 04/20/2012, 12:06 PM Ngina Royer L. Raunak Antuna DPT [phone]

## 2012-04-20 NOTE — Progress Notes (Signed)
I agree with the following treatment note after reviewing documentation.   Johnston, Lorilynn Lehr Brynn   OTR/L Pager: [phone] Office: [phone] .   

## 2012-04-20 NOTE — Progress Notes (Signed)
MSSA septic knee.  Will change ancef to 2g to maximize penetration.  Ancef 2g IV q8

## 2012-04-20 NOTE — Progress Notes (Signed)
Orthopedic Tech Progress Note Patient Details:  [name] Jun 22, 1932 [id number]  Patient ID: [name], female   DOB: 04-25-1932, 78 y.o.   MRN: [id number] Pt in cpm with 45 degrees of toleration @1700 ;rn notified  Nikki Dom 04/20/2012, 5:00 PM

## 2012-04-20 NOTE — Progress Notes (Signed)
[name], MD   Altamese Cabal, PA-C [address] [place], [place], [place]  [address]                             [phone]   PROGRESS NOTE  Subjective:  negative for Chest Pain  negative for Shortness of Breath  negative for Nausea/Vomiting   negative for Calf Pain  negative for Bowel Movement   Tolerating Diet: yes         Patient reports pain as 5 on 0-10 scale.    Objective: Vital signs in last 24 hours:   Patient Vitals for the past 24 hrs:  BP Temp Temp src Pulse Resp SpO2 Height Weight  04/20/12 0556 120/56 mmHg 98.5 F (36.9 C) Oral 69  16  97 % - -  04/20/12 0400 - - - - 18  - - -  04/20/12 0000 - - - - 20  93 % - -  04/19/12 2123 125/55 mmHg 98.9 F (37.2 C) Oral 72  18  94 % - -  04/19/12 2000 - - - - 18  93 % - -  04/19/12 1700 - - - - - - 5\' 6"  (1.676 m) 71.668 kg (158 lb)  04/19/12 1600 - - - - 18  - - -  04/19/12 1400 106/47 mmHg 97.6 F (36.4 C) - 71  16  93 % - -    @flow {1959:LAST@   Intake/Output from previous day:   09/26 0701 - 09/27 0700 In: 2712.5 [P.O.:1200; I.V.:1462.5] Out: 1950 [Urine:1950]   Intake/Output this shift:       Intake/Output      09/26 0701 - 09/27 0700 09/27 0701 - 09/28 0700   P.O. 1200    I.V. (mL/kg) 1462.5 (20.4)    IV Piggyback 50    Total Intake(mL/kg) 2712.5 (37.8)    Urine (mL/kg/hr) 1950 (1.1)    Drains     Total Output 1950    Net +762.5            LABORATORY DATA:  Basename 04/20/12 0655 04/19/12 0605 04/18/12 0547 04/17/12 1150 04/14/12 1832  WBC 7.6 8.4 9.2 12.0* 13.9*  HGB 11.3* 11.8* 11.8* 12.5 14.0  HCT 33.0* 34.8* 35.0* 36.8 40.0  PLT 190 182 172 169 171    Basename 04/18/12 0547 04/17/12 1150 04/14/12 1832  NA 138 135 137  K 3.6 3.4* 4.0  CL 96 94* 97  CO2 29 29 28   BUN 27* 29* 28*  CREATININE 0.73 0.83 0.71  GLUCOSE 101* 128* 117*  CALCIUM 9.3 10.0 11.3*   Lab Results  Component Value Date   INR 0.98 02/01/2011   INR 0.93 07/20/2010   INR 1.0 04/10/2008     Examination:  General appearance: alert, cooperative and no distress Extremities: Homans sign is negative, no sign of DVT  Wound Exam: clean, dry, intact   Drainage:  None: wound tissue dry  Motor Exam: EHL and FHL Intact  Sensory Exam: Deep Peroneal normal  Vascular Exam:    Assessment:    3 Days Post-Op  Procedure(s) (LRB): ARTHROSCOPY KNEE (Right) IRRIGATION AND DEBRIDEMENT EXTREMITY (Right)  ADDITIONAL DIAGNOSIS:  Active Problems:  * No active hospital problems. *   Acute Blood Loss Anemia   Plan: Physical Therapy as ordered Weight Bearing as Tolerated (WBAT)  DVT Prophylaxis:  Aspirin  DISCHARGE PLAN: Skilled Nursing Facility/Rehab  DISCHARGE NEEDS: HHPT, HHRN, Walker and 3-in-1 comode seat  Getting  a picc line, continue antibiotics, snf monday         Deklynn Charlet 04/20/2012, 1:08 PM

## 2012-04-20 NOTE — Progress Notes (Signed)
Occupational Therapy Treatment Patient Details Name: [name] MRN: [id number] DOB: Jun 09, 1932 Today's Date: 04/20/2012 Time: 7829-5621 OT Time Calculation (min): 23 min  OT Assessment / Plan / Recommendation Comments on Treatment Session treatment session limited due to slow progression of mobility. Pt. required all ADL's to be set-up and completed sitting in chair. Pt. demonstrated use of AE for LE bathing and dressing.      Follow Up Recommendations  Skilled nursing facility    Barriers to Discharge       Equipment Recommendations  Rolling walker with 5" wheels;3 in 1 bedside comode;Tub/shower bench    Recommendations for Other Services    Frequency Min 2X/week   Plan Discharge plan remains appropriate    Precautions / Restrictions Precautions Precautions: None Restrictions Weight Bearing Restrictions: No RLE Weight Bearing: Weight bearing as tolerated   Pertinent Vitals/Pain Pt. Reports RLE is sensitive, no pain level stated.     ADL  Grooming: Performed;Wash/dry hands;Wash/dry face;Teeth care;Brushing hair;Set up Where Assessed - Grooming: Supported sitting Upper Body Bathing: Performed;Set up Where Assessed - Upper Body Bathing: Supported sitting Lower Body Bathing: Performed;Modified independent (Only bathed LLE using AE) Where Assessed - Lower Body Bathing: Supported sitting Lower Body Dressing: Performed;Modified independent (only for donning socks using AE (sock aid)) Where Assessed - Lower Body Dressing: Supported sitting Equipment Used: Reacher;Sock aid Transfers/Ambulation Related to ADLs: Pt. unable to ambulate due to pain and sensitivity in RLE. Pt completed tasks in chair ADL Comments: Pt. educated on use of sock aid for donning socks and use of reacher/longhandled sponge to (A) with LE bathing. Treatment limited due to slow progression of mobility.    OT Diagnosis:    OT Problem List:   OT Treatment Interventions:     OT Goals Acute Rehab OT  Goals OT Goal Formulation: With patient Time For Goal Achievement: 05/02/12 Potential to Achieve Goals: Good ADL Goals Pt Will Perform Grooming: with supervision;Standing at sink ADL Goal: Grooming - Progress: Progressing toward goals Pt Will Perform Lower Body Dressing: with supervision;Sit to stand from chair;Supported ADL Goal: Lower Body Dressing - Progress: Progressing toward goals Pt Will Transfer to Toilet: with supervision;Ambulation;3-in-1 Pt Will Perform Tub/Shower Transfer: Shower transfer;Ambulation  Visit Information  Last OT Received On: 04/20/12 Assistance Needed: +2    Subjective Data      Prior Functioning       Cognition  Overall Cognitive Status: Appears within functional limits for tasks assessed/performed Arousal/Alertness: Awake/alert Orientation Level: Oriented X4 / Intact Behavior During Session: [facility] for tasks performed    Mobility  Bed Mobility Bed Mobility: Not assessed Supine to Sit: 1: +2 Total assist;With rails;HOB flat Supine to Sit: Patient Percentage: 30% Sitting - Scoot to Edge of Bed: 1: +2 Total assist Sitting - Scoot to Edge of Bed: Patient Percentage: 30% Sit to Supine: Not Tested (comment) Details for Bed Mobility Assistance: Assist for R LE secondary to pain.  Assist to raise shoulders from bed with pt pulling on rail.   Transfers Sit to Stand: 1: +2 Total assist;From elevated surface;With upper extremity assist;From chair/3-in-1 Sit to Stand: Patient Percentage: 30% Stand to Sit: 1: +2 Total assist;With upper extremity assist;With armrests;To chair/3-in-1 Stand to Sit: Patient Percentage: 30% Details for Transfer Assistance: Pt unable to complete stand pivot transfer secondary to fatigue from sitting on 3 in 1 and pain.         Exercises  Total Joint Exercises Heel Slides: 5 reps;Right;Supine;AAROM   Balance Balance Balance Assessed: Yes Static Sitting  Balance Static Sitting - Balance Support: Bilateral upper extremity  supported Static Sitting - Level of Assistance: 5: Stand by assistance Static Sitting - Comment/# of Minutes: No LOB sitting on  3 in 1   End of Session OT - End of Session Activity Tolerance: Patient limited by pain Patient left: in chair;with call bell/phone within reach;with nursing in room  GO     Cleora Fleet 04/20/2012, 1:40 PM

## 2012-04-20 NOTE — Progress Notes (Signed)
Physical Therapy Treatment Patient Details Name: [name] MRN: [id number] DOB: [birth year] Today's Date: 04/20/2012 Time: 6213-0865 PT Time Calculation (min): 24 min  PT Assessment / Plan / Recommendation Comments on Treatment Session  Spoke with PA about pt having a CPM to address ROM limitation in R LE.  Spoke with family (husband and daughter) about discharge plan to SNF and rationale for changing the plan.  Family agree with plan.     Follow Up Recommendations  Skilled nursing facility    Barriers to Discharge        Equipment Recommendations  Rolling walker with 5" wheels;3 in 1 bedside comode;Tub/shower bench    Recommendations for Other Services    Frequency Min 3X/week   Plan Discharge plan remains appropriate    Precautions / Restrictions Precautions Precautions: None Restrictions Weight Bearing Restrictions: No RLE Weight Bearing: Weight bearing as tolerated   Pertinent Vitals/Pain Pt reporting less pain in R Knee than this morning but unable to rate.      Mobility  Bed Mobility Bed Mobility: Sit to Supine Sit to Supine: 3: Mod assist Sit to Supine: Patient Percentage: 40% Details for Bed Mobility Assistance: Assist for bilateral LEs.  Cues for technique.  Transfers Transfers: Sit to Stand;Stand to Sit;Stand Pivot Transfers Sit to Stand: 1: +2 Total assist;With upper extremity assist;From chair/3-in-1 Sit to Stand: Patient Percentage: 40% Stand to Sit: 1: +2 Total assist;With upper extremity assist;To bed Stand to Sit: Patient Percentage: 40% Stand Pivot Transfers: 1: +2 Total assist Stand Pivot Transfers: Patient Percentage: 20% Details for Transfer Assistance: Pt required less assistance than previous session.   Ambulation/Gait Ambulation/Gait Assistance: Not tested (comment) Ambulation/Gait: Patient Percentage: 10% Ambulation Distance (Feet): 0 Feet Assistive device: Rolling walker Ambulation/Gait Assistance Details: Attempt to ambulate was  unsuccessful as pt was unable to advance L LE despite +2 total assist to support pt body wt, and manual facilitation to advance L LE.   Gait Pattern: Trunk flexed;Decreased step length - left;Decreased stance time - right;Decreased weight shift to right;Decreased hip/knee flexion - right General Gait Details: Pain appears to be primary limiting factor.  Pt fearful of WB on R LE.  Stairs: No    Exercises     PT Diagnosis:    PT Problem List:   PT Treatment Interventions:     PT Goals Acute Rehab PT Goals PT Goal Formulation: With patient/family Time For Goal Achievement: 05/03/12 Potential to Achieve Goals: Fair Pt will go Supine/Side to Sit: with supervision;with HOB 0 degrees Pt will go Sit to Supine/Side: with supervision;with HOB 0 degrees PT Goal: Sit to Supine/Side - Progress: Progressing toward goal Pt will go Sit to Stand: with mod assist;from elevated surface;with upper extremity assist PT Goal: Sit to Stand - Progress: Progressing toward goal Pt will go Stand to Sit: with mod assist PT Goal: Stand to Sit - Progress: Progressing toward goal Pt will Transfer Bed to Chair/Chair to Bed: with max assist PT Transfer Goal: Bed to Chair/Chair to Bed - Progress: Progressing toward goal Pt will Ambulate: 1 - 15 feet;with max assist;with rolling walker PT Goal: Ambulate - Progress: Revised due to lack of progress  Visit Information  Last PT Received On: 04/20/12 Assistance Needed: +2    Subjective Data      Cognition  Overall Cognitive Status: Appears within functional limits for tasks assessed/performed Arousal/Alertness: Awake/alert Orientation Level: Oriented X4 / Intact Behavior During Session: [facility] for tasks performed    Balance  Balance Balance Assessed: No Static  Sitting Balance Static Sitting - Balance Support: Bilateral upper extremity supported Static Sitting - Level of Assistance: 5: Stand by assistance Static Sitting - Comment/# of Minutes: No LOB sitting on  3  in 1  End of Session PT - End of Session Equipment Utilized During Treatment: Gait belt Activity Tolerance: Patient limited by fatigue;Patient limited by pain Patient left: in chair;with call bell/phone within reach;with family/visitor present;with nursing in room Nurse Communication: Weight bearing status;Mobility status   GP     [name] 04/20/2012, 3:37 PM Vester Balthazor L. Corrin Sieling DPT [phone]

## 2012-04-20 NOTE — Progress Notes (Addendum)
Physical Therapy Treatment Patient Details Name: [name] MRN: [id number] DOB: [birth year] Today's Date: 04/20/2012 Time: 8295-6213 PT Time Calculation (min): 19 min  PT Assessment / Plan / Recommendation Comments on Treatment Session  Pt presents with red rash on back.  Notified RN.  Pt mobility not improving as quickly as expected. SNF may be most appropriate setting.     Follow Up Recommendations  Skilled nursing facility    Barriers to Discharge        Equipment Recommendations  Rolling walker with 5" wheels;3 in 1 bedside comode;Tub/shower bench (Pt may need wheelchair. )    Recommendations for Other Services Rehab consult  Frequency Min 3X/week   Plan Discharge plan needs to be updated;Frequency needs to be updated    Precautions / Restrictions Precautions Precautions: None Restrictions Weight Bearing Restrictions: No RLE Weight Bearing: Weight bearing as tolerated    Pertinent Vitals/Pain 10/10 pain in posterior R Knee with any movement of R LE.  Educated pt in relaxation techniques to control pain. Pt presents with limited R knee ROM 0-30 degree of flexion (AAROM), and 0-<10 degrees flexion (active). RN notified of pt's pain.  Repositioned R LE on pillows and applied ICE packs for pain relief.     Mobility  Bed Mobility Bed Mobility: Not assessed Supine to Sit: 1: +2 Total assist;With rails;HOB flat Supine to Sit: Patient Percentage: 30% Sitting - Scoot to Edge of Bed: 1: +2 Total assist Sitting - Scoot to Edge of Bed: Patient Percentage: 30% Sit to Supine: Not Tested (comment) Details for Bed Mobility Assistance: Assist for R LE secondary to pain.  Assist to raise shoulders from bed with pt pulling on rail.   Transfers Transfers: Sit to Stand;Stand to Sit;Stand Pivot Transfers Sit to Stand: 1: +2 Total assist;From elevated surface;With upper extremity assist;From chair/3-in-1 Sit to Stand: Patient Percentage: 30% Stand to Sit: 1: +2 Total assist;With upper  extremity assist;With armrests;To chair/3-in-1 Stand to Sit: Patient Percentage: 30% Stand Pivot Transfers: 1: +2 Total assist Stand Pivot Transfers: Patient Percentage: 20% Details for Transfer Assistance: Pt unable to complete stand pivot transfer secondary to fatigue from sitting on 3 in 1 and pain.   Ambulation/Gait Ambulation/Gait Assistance: 1: +2 Total assist Ambulation/Gait: Patient Percentage: 10% Ambulation Distance (Feet): 0 Feet Assistive device: Rolling walker Ambulation/Gait Assistance Details: Attempt to ambulate was unsuccessful as pt was unable to advance L LE despite +2 total assist to support pt body wt, and manual facilitation to advance L LE.   Gait Pattern: Trunk flexed;Decreased step length - left;Decreased stance time - right;Decreased weight shift to right;Decreased hip/knee flexion - right General Gait Details: Pain appears to be primary limiting factor.  Pt fearful of WB on R LE.  Stairs: No    Exercises Total Joint Exercises Heel Slides: 5 reps;Right;Supine;AAROM   PT Diagnosis:    PT Problem List:   PT Treatment Interventions:     PT Goals Acute Rehab PT Goals PT Goal Formulation: With patient/family Time For Goal Achievement: 05/03/12 Potential to Achieve Goals: Fair Pt will go Supine/Side to Sit: with supervision;with HOB 0 degrees PT Goal: Supine/Side to Sit - Progress: Not met Pt will go Sit to Supine/Side: with supervision;with HOB 0 degrees Pt will go Sit to Stand: with upper extremity assist;from elevated surface;with mod assist PT Goal: Sit to Stand - Progress: Revised due to lack of progress Pt will go Stand to Sit: with mod assist PT Goal: Stand to Sit - Progress: Revised due to lack of  progress Pt will Transfer Bed to Chair/Chair to Bed: with max assist PT Transfer Goal: Bed to Chair/Chair to Bed - Progress: Revised due to lack of progress Pt will Ambulate: 1 - 15 feet;with max assist;with rolling walker PT Goal: Ambulate - Progress:  Revised due to lack of progress  Visit Information  Last PT Received On: 04/20/12 Assistance Needed: +2    Subjective Data  Subjective: I need to use the bathroom Patient Stated Goal: none stated.    Cognition  Overall Cognitive Status: Appears within functional limits for tasks assessed/performed Arousal/Alertness: Awake/alert Orientation Level: Oriented X4 / Intact Behavior During Session: [facility] for tasks performed    Balance  Balance Balance Assessed: Yes Static Sitting Balance Static Sitting - Balance Support: Bilateral upper extremity supported Static Sitting - Level of Assistance: 5: Stand by assistance Static Sitting - Comment/# of Minutes: No LOB sitting on  3 in 1  End of Session PT - End of Session Equipment Utilized During Treatment: Gait belt Activity Tolerance: Patient limited by fatigue;Patient limited by pain Patient left: in chair;with call bell/phone within reach;with family/visitor present;with nursing in room Nurse Communication: Weight bearing status;Mobility status   GP     Regina Ganci 04/20/2012, 12:25 PM Brent Taillon L. Valaree Fresquez DPT [phone]

## 2012-04-21 NOTE — Progress Notes (Signed)
Orthopedic Tech Progress Note Patient Details:  [name] February 24, 1932 [id number]  Patient ID: [name], female   DOB: Nov 01, 1931, 78 y.o.   MRN: [id number] Confirmed pt has CPM.   Reeve Turnley T 04/21/2012, 1:25 PM

## 2012-04-21 NOTE — Progress Notes (Signed)
Seen in room 5N08  Vital signs stable afebrile  S: did better with PT  O: seated in chair comfortable, just finished with PT  A: stable, s/p irrigation for septic arthritis right knee  P: conti IV antibx

## 2012-04-21 NOTE — Progress Notes (Signed)
Physical Therapy Treatment Patient Details Name: [name] MRN: [id number] DOB: May 31, 1932 Today's Date: 04/21/2012 Time: 4098-1191 PT Time Calculation (min): 24 min  PT Assessment / Plan / Recommendation Comments on Treatment Session  Pt mobility and activity tolerance much improved. Pt/family credit the CPM machine with improving the pt's tolerance to movement of the R Knee. Pt presents 0-55 degrees of AA ROM in R knee.     Follow Up Recommendations  Skilled nursing facility    Barriers to Discharge        Equipment Recommendations  Rolling walker with 5" wheels;3 in 1 bedside comode;Tub/shower bench    Recommendations for Other Services    Frequency Min 3X/week   Plan Discharge plan remains appropriate    Precautions / Restrictions Precautions Precautions: None Restrictions Weight Bearing Restrictions: No RLE Weight Bearing: Weight bearing as tolerated   Pertinent Vitals/Pain Pt reports pain in knee 3/10 at rest.      Mobility  Bed Mobility Bed Mobility: Sit to Supine Supine to Sit: 3: Mod assist Supine to Sit: Patient Percentage: 60% Sitting - Scoot to Edge of Bed: 3: Mod assist Sitting - Scoot to Edge of Bed: Patient Percentage: 60% Sit to Supine: Not Tested (comment) Details for Bed Mobility Assistance: Assist for R LE. Cues for technique.  Transfers Transfers: Sit to Stand;Stand to [employer] Transfers Sit to Stand: 2: Max assist;With upper extremity assist;From bed Sit to Stand: Patient Percentage: 40% Stand to Sit: 2: Max assist;With upper extremity assist;To chair/3-in-1;With armrests Stand to Sit: Patient Percentage: 40% Stand Pivot Transfers: 2: Max assist Stand Pivot Transfers: Patient Percentage: 40% Details for Transfer Assistance: Pt able to initate standing with tactile and verbal cues.  Pt required assist to extend hips and trunk secondary to generalized weakness.  Assist to position R LE prior to sitting and standing.  Much improved stand  pivot trnasfer.   Ambulation/Gait Ambulation/Gait Assistance: 1: +2 Total assist Ambulation/Gait: Patient Percentage: 30% Ambulation Distance (Feet): 6 Feet Assistive device: Rolling walker Ambulation/Gait Assistance Details: step by step cueing for sequencing, assist to manage RW as pt veers to the right.  Assist to support and shift pt's body wt laterally to advance L LE.   Gait Pattern: Trunk flexed;Decreased step length - left;Decreased stance time - right;Decreased weight shift to right;Decreased hip/knee flexion - right Gait velocity: excessively slow.  long pause between each step  General Gait Details: Pt continues to have difficulty advancing L LE secondary to R LE pain and weakness. Stairs: No    Exercises Total Joint Exercises Ankle Circles/Pumps: Both;10 reps Heel Slides: 5 reps;Right;Supine;AAROM   PT Diagnosis:    PT Problem List:   PT Treatment Interventions:     PT Goals Acute Rehab PT Goals PT Goal Formulation: With patient/family Time For Goal Achievement: 05/03/12 Potential to Achieve Goals: Fair Pt will go Supine/Side to Sit: with supervision;with HOB 0 degrees PT Goal: Supine/Side to Sit - Progress: Progressing toward goal Pt will go Sit to Supine/Side: with supervision;with HOB 0 degrees Pt will go Sit to Stand: with mod assist;from elevated surface;with upper extremity assist PT Goal: Sit to Stand - Progress: Progressing toward goal Pt will go Stand to Sit: with mod assist PT Goal: Stand to Sit - Progress: Progressing toward goal Pt will Transfer Bed to Chair/Chair to Bed: with max assist PT Transfer Goal: Bed to Chair/Chair to Bed - Progress: Progressing toward goal Pt will Ambulate: 1 - 15 feet;with max assist;with rolling walker PT Goal: Ambulate -  Progress: Progressing toward goal  Visit Information  Last PT Received On: 04/21/12 Assistance Needed: +2    Subjective Data  Subjective: I can bend my knee myself today.  I have used the CPM for 1.5  hours.  Patient Stated Goal: none stated.    Cognition  Overall Cognitive Status: Appears within functional limits for tasks assessed/performed Arousal/Alertness: Awake/alert Orientation Level: Oriented X4 / Intact Behavior During Session: [facility] for tasks performed    Balance  Balance Balance Assessed: No  End of Session PT - End of Session Equipment Utilized During Treatment: Gait belt Activity Tolerance: Patient tolerated treatment well Patient left: in chair;with call bell/phone within reach;with family/visitor present;with nursing in room Nurse Communication: Weight bearing status;Mobility status CPM Right Knee CPM Right Knee: On Right Knee Flexion (Degrees): 45  Right Knee Extension (Degrees): 0    GP     Jaymison Luber 04/21/2012, 2:28 PM Jahnyla Parrillo L. Socorro Ebron DPT [phone]

## 2012-04-22 LAB — ANAEROBIC CULTURE

## 2012-04-22 MED ORDER — TRAMADOL HCL 50 MG PO TABS
50.0000 mg | ORAL_TABLET | Freq: Four times a day (QID) | ORAL | Status: DC
  Administered 2012-04-23 (×2): 50 mg via ORAL
  Filled 2012-04-22 (×2): qty 1

## 2012-04-22 NOTE — Progress Notes (Signed)
Afebrile vs's stable  Feeling better with less pain, wants her foley out  Seated in chair comfortably  Plan: remove foley, IV antibix

## 2012-04-23 MED ORDER — HEPARIN SOD (PORK) LOCK FLUSH 100 UNIT/ML IV SOLN
250.0000 [IU] | INTRAVENOUS | Status: AC | PRN
  Administered 2012-04-23: 250 [IU]

## 2012-04-23 MED ORDER — CEFAZOLIN SODIUM-DEXTROSE 2-3 GM-% IV SOLR: 2.0000 g | Freq: Three times a day (TID) | INTRAVENOUS | Status: DC

## 2012-04-23 MED ORDER — HYDROCODONE-ACETAMINOPHEN 5-325 MG PO TABS: 1.0000 | ORAL_TABLET | ORAL | Status: DC | PRN

## 2012-04-23 MED ORDER — METHOCARBAMOL 500 MG PO TABS: 500.0000 mg | ORAL_TABLET | Freq: Four times a day (QID) | ORAL | Status: DC | PRN

## 2012-04-23 MED ORDER — ASPIRIN 81 MG PO TBEC: 81.0000 mg | DELAYED_RELEASE_TABLET | Freq: Every day | ORAL | Status: DC

## 2012-04-23 NOTE — Progress Notes (Signed)
Utilization review completed. Wael Maestas, RN, BSN. 

## 2012-04-23 NOTE — Progress Notes (Signed)
PICC line capped off, flushed with 10cc NS, GBR.  Flushed with heparin 2.5ml (100u/ml).  [name] M  

## 2012-04-23 NOTE — Progress Notes (Signed)
  Pamela Spurling, MD   Altamese Cabal, PA-C [address] [place], [place], [place]  [address]                             [phone]   PROGRESS NOTE  Subjective:  negative for Chest Pain  negative for Shortness of Breath  negative for Nausea/Vomiting   negative for Calf Pain  negative for Bowel Movement   Tolerating Diet: yes         Patient reports pain as 5 on 0-10 scale.    Objective: Vital signs in last 24 hours:   Patient Vitals for the past 24 hrs:  BP Temp Temp src Pulse Resp SpO2  04/23/12 0629 135/63 mmHg 98.5 F (36.9 C) Oral 81  16  94 %  04/22/12 1959 140/58 mmHg 97.9 F (36.6 C) Oral 80  16  97 %  04/22/12 1528 - - - - 16  94 %  04/22/12 1527 - - - - 16  94 %  04/22/12 1500 119/55 mmHg 97.9 F (36.6 C) Oral 80  20  97 %  04/22/12 1152 - - - - 16  94 %    @flow {1959:LAST@   Intake/Output from previous day:   09/29 0701 - 09/30 0700 In: 840 [P.O.:840] Out: 1250 [Urine:1250]   Intake/Output this shift:   09/30 0701 - 09/30 1900 In: 240 [P.O.:240] Out: 150 [Urine:150]   Intake/Output      09/29 0701 - 09/30 0700 09/30 0701 - 10/01 0700   P.O. 840 240   Total Intake(mL/kg) 840 (11.7) 240 (3.3)   Urine (mL/kg/hr) 1250 (0.7) 150   Total Output 1250 150   Net -410 +90           LABORATORY DATA:  Basename 04/20/12 0655 04/19/12 0605 04/18/12 0547 04/17/12 1150  WBC 7.6 8.4 9.2 12.0*  HGB 11.3* 11.8* 11.8* 12.5  HCT 33.0* 34.8* 35.0* 36.8  PLT 190 182 172 169    Basename 04/18/12 0547 04/17/12 1150  NA 138 135  K 3.6 3.4*  CL 96 94*  CO2 29 29  BUN 27* 29*  CREATININE 0.73 0.83  GLUCOSE 101* 128*  CALCIUM 9.3 10.0   Lab Results  Component Value Date   INR 0.98 02/01/2011   INR 0.93 07/20/2010   INR 1.0 04/10/2008    Examination:  General appearance: alert, cooperative and no distress Extremities: Homans sign is negative, no sign of DVT  Wound Exam: clean, dry, intact   Drainage:  None: wound tissue dry  Motor Exam: EHL and FHL  Intact  Sensory Exam: Deep Peroneal normal  Vascular Exam:    Assessment:    6 Days Post-Op  Procedure(s) (LRB): ARTHROSCOPY KNEE (Right) IRRIGATION AND DEBRIDEMENT EXTREMITY (Right)  ADDITIONAL DIAGNOSIS:  Active Problems:  * No active hospital problems. *   Acute Blood Loss Anemia   Plan: Physical Therapy as ordered Weight Bearing as Tolerated (WBAT)  DVT Prophylaxis:  Aspirin  DISCHARGE PLAN: Skilled Nursing Facility/Rehab  DISCHARGE NEEDS: HHPT, HHRN, CPM, Walker and 3-in-1 comode seat         [name] 04/23/2012, 9:49 AM

## 2012-04-23 NOTE — Discharge Summary (Signed)
[name], MD   Altamese Cabal, PA-C [address] [place], [place], [place]  [address]                             [phone]  PATIENT ID: [name]        MRN:  [id number]          DOB/AGE: 1932/07/14 / 78 y.o.    DISCHARGE SUMMARY  ADMISSION DATE:    04/17/2012 DISCHARGE DATE:   04/23/2012   ADMISSION DIAGNOSIS: Right Knee Skin Infection    DISCHARGE DIAGNOSIS:  Right knee skin infection [686.9]    ADDITIONAL DIAGNOSIS: Active Problems:  * No active hospital problems. *   Past Medical History  Diagnosis Date  . Hypertension   . High cholesterol   . Complication of anesthesia 04/18/2012    "didn't tolerate it today very well; had the shakes and very hard time w/it"  . Breast cancer   . Arthritis     "in my back"  . Depression   . Osteoporosis     PROCEDURE: Procedure(s): ARTHROSCOPY KNEE IRRIGATION AND DEBRIDEMENT EXTREMITY on 04/17/2012  CONSULTS: Treatment Team:  Sebastian Ache, MD   HISTORY:  See H&P in chart  HOSPITAL COURSE:  GENIEVE RAMASWAMY is a 78 y.o. admitted on 04/17/2012 and found to have a diagnosis of Right knee skin infection [686.9].  After appropriate laboratory studies were obtained  they were taken to the operating room on 04/17/2012 and underwent Procedure(s): ARTHROSCOPY KNEE IRRIGATION AND DEBRIDEMENT EXTREMITY.   They were given perioperative antibiotics:  Anti-infectives     Start     Dose/Rate Route Frequency Ordered Stop   04/23/12 0000   ceFAZolin (ANCEF) 2-3 GM-% SOLR        2 g 100 mL/hr over 30 Minutes Intravenous Every 8 hours 04/23/12 1001     04/20/12 1400   ceFAZolin (ANCEF) IVPB 2 g/50 mL premix        2 g 100 mL/hr over 30 Minutes Intravenous 3 times per day 04/20/12 1101     04/19/12 1500   ceFAZolin (ANCEF) IVPB 1 g/50 mL premix  Status:  Discontinued        1 g 100 mL/hr over 30 Minutes Intravenous 3 times per day 04/19/12 1450 04/20/12 1101   04/18/12 1345   vancomycin (VANCOCIN) IVPB 1000 mg/200 mL premix       1,000 mg 200 mL/hr over 60 Minutes Intravenous Every 12 hours 04/18/12 1336 04/19/12 1442   04/17/12 2030   vancomycin (VANCOCIN) IVPB 1000 mg/200 mL premix        1,000 mg 200 mL/hr over 60 Minutes Intravenous Every 12 hours 04/17/12 1908 04/17/12 2230        .  Tolerated the procedure well.  Placed with a foley intraoperatively.  Given Ofirmev at induction and for 48 hours.    POD #1, allowed out of bed to a chair.  PT for ambulation and exercise program.  Foley D/C'd in morning.  IV saline locked.  O2 discontionued.  POD #2, continued PT and ambulation.    . Urology consult for urinary retention. Resolving  The remainder of the hospital course was dedicated to ambulation and strengthening.   The patient was discharged on 6 Days Post-Op in  Good condition.  Blood products given:none  DIAGNOSTIC STUDIES: Recent vital signs: Patient Vitals for the past 24 hrs:  BP Temp Temp src Pulse Resp SpO2  04/23/12 0800 - - - - 17  94 %  04/23/12 0629 135/63 mmHg 98.5 F (36.9 C) Oral 81  16  94 %  04/22/12 1959 140/58 mmHg 97.9 F (36.6 C) Oral 80  16  97 %  04/22/12 1528 - - - - 16  94 %  04/22/12 1527 - - - - 16  94 %  04/22/12 1500 119/55 mmHg 97.9 F (36.6 C) Oral 80  20  97 %  04/22/12 1152 - - - - 16  94 %       Recent laboratory studies:  Basename 04/20/12 0655 May 11, 2012 0605 04/18/12 0547 05/09/2012 1150  WBC 7.6 8.4 9.2 12.0*  HGB 11.3* 11.8* 11.8* 12.5  HCT 33.0* 34.8* 35.0* 36.8  PLT 190 182 172 169    Basename 04/18/12 0547 May 09, 2012 1150  NA 138 135  K 3.6 3.4*  CL 96 94*  CO2 29 29  BUN 27* 29*  CREATININE 0.73 0.83  GLUCOSE 101* 128*  CALCIUM 9.3 10.0   Lab Results  Component Value Date   INR 0.98 02/01/2011   INR 0.93 07/20/2010   INR 1.0 04/10/2008     Recent Radiographic Studies :  Dg Chest Port 1 View  May 09, 2012  *RADIOLOGY REPORT*  Clinical Data: Preoperative assessment for irrigation and debridement, history hypertension, breast cancer   PORTABLE CHEST - 1 VIEW  Comparison: Portable exam 1611 hours compared to 07/16/2005  Findings: Mild kyphotic positioning with slight rotation to the right. Enlargement of cardiac silhouette. Vascular congestion. Tortuous aorta. Question enlargement of right hilum. Chronic peribronchial thickening. Increased accentuation of perihilar markings since previous exam could be related to mild infiltrate or edema. Minimal bibasilar atelectasis. No pleural effusion or pneumothorax. Osseous demineralization. Surgical clips left axilla.  IMPRESSION: Enlargement of cardiac silhouette with pulmonary vascular congestion. Bronchitic changes with increased perihilar markings, cannot exclude minimal edema or infection. Mild bibasilar atelectasis. Question right hilar enlargement versus artifact from rotation; recommend follow-up upright PA and lateral chest radiographs when the patient's clinical condition permits for further assessment to exclude adenopathy.   Original Report Authenticated By: Lollie Marrow, M.D.    Dg Knee Complete 4 Views Right  04/14/2012  *RADIOLOGY REPORT*  Clinical Data: Joint pain/swelling  RIGHT KNEE - COMPLETE 4+ VIEW  Comparison: MRI right knee dated 03/29/2012  Findings: No fracture or dislocation is seen.  Moderate tricompartmental degenerative changes with chondrocalcinosis.  Moderate suprapatellar knee joint effusion.  IMPRESSION: Moderate tricompartmental degenerative changes with chondrocalcinosis.  Moderate suprapatellar knee joint effusion.   Original Report Authenticated By: Charline Bills, M.D.     DISCHARGE INSTRUCTIONS: Discharge Orders    Future Appointments: Provider: Department: Dept Phone: Center:   04/26/2012 3:20 PM [facility] [phone] [facility]   05/28/2012 11:15 AM Ginnie Smart, MD [facility] [phone] [facility]     Future Orders Please Complete By Expires   Diet - low sodium heart healthy      Call MD / Call 911      Comments:    If you experience chest pain or shortness of breath, CALL 911 and be transported to the hospital emergency room.  If you develope a fever above 101 F, pus (white drainage) or increased drainage or redness at the wound, or calf pain, call your surgeon's office.   Constipation Prevention      Comments:   Drink plenty of fluids.  Prune juice may be helpful.  You may use a stool softener,  such as Colace (over the counter) 100 mg twice a day.  Use MiraLax (over the counter) for constipation as needed.   Increase activity slowly as tolerated      Driving restrictions      Comments:   No driving for 6 weeks   Lifting restrictions      Comments:   No lifting for 6 weeks   CPM      Comments:   Continuous passive motion machine (CPM):      Use the CPM from 0 to 60 for 6-8 hours per day.      You may increase by 10 per day.  You may break it up into 2 or 3 sessions per day.      Use CPM for 2 weeks or until you are told to stop.   Change dressing      Comments:   Change dressing on tuesday, then change the dressing daily with sterile 4 x 4 inch gauze dressing and apply ace wrap      DISCHARGE MEDICATIONS:     Medication List     As of 04/23/2012 10:04 AM    STOP taking these medications         FORTEO 600 MCG/2.4ML Soln   Generic drug: Teriparatide (Recombinant)      TAKE these medications         aspirin 81 MG EC tablet   Take 1 tablet (81 mg total) by mouth daily. Swallow whole.      ceFAZolin 2-3 GM-% Solr   Commonly known as: ANCEF   Inject 50 mLs (2 g total) into the vein every 8 (eight) hours.      celecoxib 200 MG capsule   Commonly known as: CELEBREX   Take 200 mg by mouth 2 (two) times daily.      escitalopram 10 MG tablet   Commonly known as: LEXAPRO   Take 10 mg by mouth every morning.      HYDROcodone-acetaminophen 5-325 MG per tablet   Commonly known as: NORCO/VICODIN   Take 1-2 tablets by mouth every 4 (four) hours as needed (breakthrough pain).       methocarbamol 500 MG tablet   Commonly known as: ROBAXIN   Take 1 tablet (500 mg total) by mouth every 6 (six) hours as needed.      multivitamin tablet   Take 1 tablet by mouth every morning.      simvastatin 20 MG tablet   Commonly known as: ZOCOR   Take 20 mg by mouth every morning.      triamterene-hydrochlorothiazide 75-50 MG per tablet   Commonly known as: MAXZIDE   Take 1 tablet by mouth daily.      Vitamin D3 2000 UNITS Tabs   Take 2 tablets by mouth every morning.        FOLLOW UP VISIT:       Follow-up Information    Follow up with Raymon Mutton, MD. Call on 05/01/2012.   [address] [place] [address] [phone]          DISPOSITION:  Skilled nursing facility    condiTION:  Good  Mattisen Pohlmann 04/23/2012, 10:04 AM

## 2012-04-23 NOTE — Progress Notes (Signed)
Patient discharged via ambulance in stable condition. Discharge instructions and prescriptions were sent with packet.

## 2012-04-23 NOTE — Progress Notes (Signed)
PT PROGRESS NOTE  04/23/12 1000  PT Visit Information  Last PT Received On 04/23/12  PT Time Calculation  PT Start Time 1040  PT Stop Time 1106  PT Time Calculation (min) 26 min  Subjective Data  Subjective I am feeling better today  Patient Stated Goal Walk   Precautions  Precautions None  Restrictions  Weight Bearing Restrictions No  RLE Weight Bearing WBAT  Cognition  Overall Cognitive Status Appears within functional limits for tasks assessed/performed  Arousal/Alertness Awake/alert  Orientation Level Oriented X4 / Intact  Behavior During Session [facility] for tasks performed  Bed Mobility  Bed Mobility Sit to Supine  Supine to Sit 4: Min assist  Supine to Sit: Patient Percentage 80%  Sitting - Scoot to Edge of Bed 4: Min assist  Sitting - Scoot to Edge of Bed: Patient Percentage 80%  Sit to Supine Not Tested (comment)  Details for Bed Mobility Assistance Assist for R LE. Cues for technique.   Transfers  Transfers Sit to Stand;Stand to Sit  Sit to Stand 4: Min assist;With upper extremity assist  Sit to Stand: Patient Percentage 80%  Stand to Sit 4: Min assist;To chair/3-in-1;With upper extremity assist  Stand to Sit: Patient Percentage 80%  Details for Transfer Assistance Cues for hand placement assist to initiate stand and contol descent to sit.  Ambulation/Gait  Ambulation/Gait Assistance 3: Mod assist  Ambulation/Gait: Patient Percentage 60%  Ambulation Distance (Feet) 15 Feet  Assistive device Rolling walker  Ambulation/Gait Assistance Details Cues for sequencing and increase WB on R LE.   Gait Pattern Trunk flexed;Decreased step length - left;Decreased stance time - right;Decreased weight shift to right;Decreased hip/knee flexion - right  Gait velocity excessively slow.  long pause between each step   General Gait Details Pt able to advance L LE more quickly after prompting by PT.   Stairs No  Balance  Balance Assessed No  Total Joint Exercises  Ankle Circles/Pumps  Both;10 reps  Quad Sets Right;5 reps;Supine  Heel Slides 5 reps;Right;Supine;AAROM  PT - End of Session  Equipment Utilized During Treatment Gait belt  Activity Tolerance Patient tolerated treatment well  Patient left in chair;with call bell/phone within reach;with family/visitor present;with nursing in room  Nurse Communication Weight bearing status;Mobility status  PT - Assessment/Plan  Comments on Treatment Session Pt continues to progress in mobility  PT Plan Discharge plan remains appropriate  PT Frequency Min 3X/week  Equipment Recommended Rolling walker with 5" wheels;3 in 1 bedside comode;Tub/shower bench  Acute Rehab PT Goals  PT Goal Formulation With patient/family  Time For Goal Achievement 05/03/12  Potential to Achieve Goals Fair  Pt will go Supine/Side to Sit with supervision;with HOB 0 degrees  PT Goal: Supine/Side to Sit - Progress Progressing toward goal  Pt will go Sit to Supine/Side with supervision;with HOB 0 degrees  PT Goal: Sit to Supine/Side - Progress Progressing toward goal  Pt will go Sit to Stand with mod assist;from elevated surface;with upper extremity assist  PT Goal: Sit to Stand - Progress Progressing toward goal  Pt will go Stand to Sit with mod assist  PT Goal: Stand to Sit - Progress Progressing toward goal  Pt will Transfer Bed to Chair/Chair to Bed with max assist  PT Transfer Goal: Bed to Chair/Chair to Bed - Progress Met  Pt will Ambulate 1 - 15 feet;with max assist;with rolling walker  PT Goal: Ambulate - Progress Met  PT General Charges  $$ ACUTE PT VISIT 1 Procedure  PT Treatments  $  Gait Training 8-22 mins  $Therapeutic Activity 8-22 mins  Sammie Denner L. Kearstin Learn DPT [phone]

## 2012-04-26 ENCOUNTER — Ambulatory Visit: Payer: Medicare Other

## 2012-05-07 ENCOUNTER — Telehealth: Payer: Self-pay | Admitting: *Deleted

## 2012-05-07 NOTE — Telephone Encounter (Signed)
Patient's husband called with questions on her IV antibiotic therapy.  She is not established at this clinic yet, and advised if there is a problem her home health nurse would page the ID MD. She has an appt with Dr. Ninetta Lights on 05/28/12.  He wanted to know length of therapy and I told him per the hospital note it states 6 weeks. Wendall Mola CMA

## 2012-05-15 ENCOUNTER — Telehealth: Payer: Self-pay | Admitting: *Deleted

## 2012-05-15 NOTE — Telephone Encounter (Signed)
Patient's husband sent a letter to Dr. Daiva Eves requesting a phone call on her treatment and if she was well enough to travel 05/28/12.  He does not feel comfortable returning call as he has never seen patient and is not familiar with her infection.  He is seeing her on 05/23/12 and will review hospital notes at that time.  Called husband and left him a message stating that Dr. Daiva Eves will try to answer any questions he may have at patient's office visit. Wendall Mola CMA

## 2012-05-23 ENCOUNTER — Encounter: Payer: Self-pay | Admitting: Infectious Disease

## 2012-05-23 ENCOUNTER — Ambulatory Visit (INDEPENDENT_AMBULATORY_CARE_PROVIDER_SITE_OTHER): Payer: Medicare Other | Admitting: Infectious Disease

## 2012-05-23 VITALS — BP 122/69 | HR 79 | Temp 98.2°F | Wt 150.2 lb

## 2012-05-23 DIAGNOSIS — R7401 Elevation of levels of liver transaminase levels: Secondary | ICD-10-CM

## 2012-05-23 DIAGNOSIS — A4901 Methicillin susceptible Staphylococcus aureus infection, unspecified site: Secondary | ICD-10-CM

## 2012-05-23 DIAGNOSIS — M009 Pyogenic arthritis, unspecified: Secondary | ICD-10-CM | POA: Insufficient documentation

## 2012-05-23 DIAGNOSIS — R748 Abnormal levels of other serum enzymes: Secondary | ICD-10-CM | POA: Insufficient documentation

## 2012-05-23 DIAGNOSIS — D649 Anemia, unspecified: Secondary | ICD-10-CM

## 2012-05-23 LAB — COMPLETE METABOLIC PANEL WITH GFR
AST: 32 U/L (ref 0–37)
Albumin: 3.3 g/dL — ABNORMAL LOW (ref 3.5–5.2)
Alkaline Phosphatase: 199 U/L — ABNORMAL HIGH (ref 39–117)
Potassium: 4.1 mEq/L (ref 3.5–5.3)
Sodium: 139 mEq/L (ref 135–145)
Total Protein: 7.3 g/dL (ref 6.0–8.3)

## 2012-05-23 LAB — CBC WITH DIFFERENTIAL/PLATELET
Basophils Absolute: 0 10*3/uL (ref 0.0–0.1)
Basophils Relative: 0 % (ref 0–1)
Eosinophils Relative: 3 % (ref 0–5)
HCT: 33.1 % — ABNORMAL LOW (ref 36.0–46.0)
MCHC: 32.3 g/dL (ref 30.0–36.0)
MCV: 92.2 fL (ref 78.0–100.0)
Monocytes Absolute: 0.8 10*3/uL (ref 0.1–1.0)
RDW: 13.8 % (ref 11.5–15.5)

## 2012-05-23 LAB — C-REACTIVE PROTEIN: CRP: 2.7 mg/dL — ABNORMAL HIGH (ref <0.60)

## 2012-05-23 MED ORDER — SACCHAROMYCES BOULARDII 250 MG PO CAPS: 250.0000 mg | ORAL_CAPSULE | Freq: Two times a day (BID) | ORAL | Status: DC

## 2012-05-23 NOTE — Patient Instructions (Signed)
Fu with Dr. Ninetta Lights or other ID group

## 2012-05-23 NOTE — Progress Notes (Signed)
Subjective:    Patient ID: [name], female    DOB: Aug 22, 1931, 78 y.o.   MRN: [id number]  HPI  78 year old Caucasian lady with right native septic arthritis with methicillin sensitive Staphylococcus aureus  After the surgery.  She underwent formal I&D by Dr. Valentina Gu on September 24 and was then narrowed from postoperative vancomycin to IV cefazolin 2 g IV every 8 hours. She is on course to finish 6 weeks of therapy next Tuesday. Her pain has dramatically improved she states that prior to surgery her pain was be a beyond greater than 10 out of 10 even at rest. Now her pain is more on the order for 5/10 severity and typically only comes on after engaging in physical therapy and weightbearing. She has no fever nausea or malaise. Her safety labs drawn last week at the skilled nursing facility showed her serum creatinine to be normal they showed her hemoglobin to drop of 11-10. Her liver function tests were significant for an elevated alkaline phosphatase and a slightly elevated transaminases. Her husband accompanied her to clinic today. [name] and in particular the husband are very anxious to make a trip to [place] on November the 5th. I have informed the patient and her husband that I would be willing to stop antibiotics one to 2 days prior to their trip if this would help to make the trip. Upon having discussions with the patient and the husband is not clear whether the patient herself feels out of his trip yet. I've gone to great detail about the nature of methicillin sensitive staph aureus infection the risk for recurrence important signs to monitor 4. I've explained the labs we will check today including a sedimentation rate C-reactive protein competent metabolic panel and CBC with differential.  Is also some anxiety about her potentially requiring a C. difficile infection do to the antibiotics he is currently receiving. I am willing to prescribe Flora Star to prevent a C. difficile infection  I would also encourage avoidance of proton pump inhibitors and histamine blockers.   I spent greater than 45 minutes with the patient including greater than 50% of time in face to face counsel of the patient and in coordination of their care.   Review of Systems  Constitutional: Negative for fever, chills, diaphoresis, activity change, appetite change, fatigue and unexpected weight change.  HENT: Negative for congestion, sore throat, rhinorrhea, sneezing, trouble swallowing and sinus pressure.   Eyes: Negative for photophobia and visual disturbance.  Respiratory: Negative for cough, chest tightness, shortness of breath, wheezing and stridor.   Cardiovascular: Negative for chest pain, palpitations and leg swelling.  Gastrointestinal: Negative for nausea, vomiting, abdominal pain, diarrhea, constipation, blood in stool, abdominal distention and anal bleeding.  Genitourinary: Negative for dysuria, hematuria, flank pain and difficulty urinating.  Musculoskeletal: Positive for joint swelling. Negative for myalgias, back pain, arthralgias and gait problem.  Skin: Negative for color change, pallor, rash and wound.  Neurological: Negative for dizziness, tremors, weakness and light-headedness.  Hematological: Negative for adenopathy. Does not bruise/bleed easily.  Psychiatric/Behavioral: Negative for behavioral problems, confusion, disturbed wake/sleep cycle, dysphoric mood, decreased concentration and agitation.       Objective:   Physical Exam  Constitutional: She is oriented to person, place, and time. She appears well-nourished. No distress.  HENT:  Head: Normocephalic and atraumatic.  Mouth/Throat: Oropharynx is clear and moist.  Eyes: Conjunctivae normal and EOM are normal. Pupils are equal, round, and reactive to light.  Neck: Normal range of  motion. Neck supple.  Cardiovascular: Normal rate and regular rhythm.   Pulmonary/Chest: Effort normal and breath sounds normal. No respiratory  distress.  Abdominal: She exhibits no distension and no mass.  Musculoskeletal: She exhibits no edema.       Right knee: She exhibits swelling and effusion.       Legs: Lymphadenopathy:    She has no cervical adenopathy.  Neurological: She is alert and oriented to person, place, and time.  Skin: Skin is warm and dry. She is not diaphoretic. No erythema. No pallor.     Psychiatric: She has a normal mood and affect. Her behavior is normal. Judgment and thought content normal.          Assessment & Plan:  MSSA native septic joint: Aim is to finish 42 days of postoperative antibiotics. I am willing to be flexible a short course to 40 days of this would accommodate the patient and her husband's wishes to go on vacation. Gated broad weekly CBC and weekly CMP. We'll check a sedimentation rate and C-reactive protein today as well. Her prior sedimentation rate was ever 100.  Elevated liver function tests: We'll recheck her CMP today. Patient is asymptomatic and has never had gallstones before.  Anemia: Appears relatively stable compared to discharge. We'll recheck her labs today.  Concern for C. difficile infection: I will prescribe Flora Star if they can obtain this with her insurance otherwise they can try an over-the-counter probiotic.

## 2012-05-25 ENCOUNTER — Telehealth: Payer: Self-pay | Admitting: Licensed Clinical Social Worker

## 2012-05-25 ENCOUNTER — Telehealth: Payer: Self-pay | Admitting: *Deleted

## 2012-05-25 NOTE — Telephone Encounter (Signed)
Patient called for lab results and wanted to know if she can still stop her iv abx on 05/29/2012. Per Dr. Daiva Eves she can still stop on 05/29/2012.I will call the patient and let her know.

## 2012-05-25 NOTE — Telephone Encounter (Signed)
Patient's husband called regarding her lab results, wanted to know if IV antibiotic could be stopped 2 days prior to trip as discussed. Wendall Mola CMA

## 2012-05-28 ENCOUNTER — Inpatient Hospital Stay: Payer: Medicare Other | Admitting: Infectious Diseases

## 2012-05-28 ENCOUNTER — Telehealth: Payer: Self-pay | Admitting: *Deleted

## 2012-05-28 NOTE — Telephone Encounter (Signed)
Yes they can if she is up to the trip obviously

## 2012-05-28 NOTE — Telephone Encounter (Signed)
RN reviewed telephone messages. Dr. Daiva Eves has approved completion of IV antibiotics, PICC removal and discharge from [facility].  Phone call to pt's husband and [facility] with this information.

## 2012-06-05 ENCOUNTER — Other Ambulatory Visit: Payer: Self-pay | Admitting: Orthopedic Surgery

## 2012-06-05 ENCOUNTER — Ambulatory Visit
Admission: RE | Admit: 2012-06-05 | Discharge: 2012-06-05 | Disposition: A | Discharge status: Home / Self Care | Payer: Medicare Other | Source: Ambulatory Visit | Attending: Orthopedic Surgery | Admitting: Orthopedic Surgery

## 2012-06-05 DIAGNOSIS — M79669 Pain in unspecified lower leg: Secondary | ICD-10-CM

## 2012-06-13 ENCOUNTER — Other Ambulatory Visit: Payer: Self-pay | Admitting: *Deleted

## 2012-06-13 ENCOUNTER — Encounter: Payer: Self-pay | Admitting: Infectious Diseases

## 2012-06-13 ENCOUNTER — Ambulatory Visit (INDEPENDENT_AMBULATORY_CARE_PROVIDER_SITE_OTHER): Payer: Medicare Other | Admitting: Infectious Diseases

## 2012-06-13 VITALS — BP 137/74 | HR 81 | Temp 97.7°F | Ht 66.5 in | Wt 157.0 lb

## 2012-06-13 DIAGNOSIS — M009 Pyogenic arthritis, unspecified: Secondary | ICD-10-CM

## 2012-06-13 LAB — COMPREHENSIVE METABOLIC PANEL
ALT: 9 U/L (ref 0–35)
BUN: 25 mg/dL — ABNORMAL HIGH (ref 6–23)
CO2: 32 mEq/L (ref 19–32)
Creat: 0.75 mg/dL (ref 0.50–1.10)
Glucose, Bld: 97 mg/dL (ref 70–99)
Total Bilirubin: 0.6 mg/dL (ref 0.3–1.2)

## 2012-06-13 LAB — CBC WITH DIFFERENTIAL/PLATELET
Eosinophils Absolute: 0.2 10*3/uL (ref 0.0–0.7)
Eosinophils Relative: 2 % (ref 0–5)
Lymphs Abs: 2.5 10*3/uL (ref 0.7–4.0)
MCH: 29.9 pg (ref 26.0–34.0)
MCV: 89.2 fL (ref 78.0–100.0)
Monocytes Absolute: 0.8 10*3/uL (ref 0.1–1.0)
Platelets: 292 10*3/uL (ref 150–400)
RBC: 3.61 MIL/uL — ABNORMAL LOW (ref 3.87–5.11)

## 2012-06-13 MED ORDER — CEPHALEXIN 500 MG PO CAPS
500.0000 mg | ORAL_CAPSULE | Freq: Two times a day (BID) | ORAL | Status: AC
Start: 2012-06-13 — End: 2012-06-23

## 2012-06-13 MED ORDER — SACCHAROMYCES BOULARDII 250 MG PO CAPS: 250.0000 mg | ORAL_CAPSULE | Freq: Two times a day (BID) | ORAL | Status: DC

## 2012-06-13 NOTE — Assessment & Plan Note (Signed)
Will restart her anbx, keflex for at least 3 months. Her course is complicated by the presence as well of a baker's cyst (large) and her hs of venous stripping (given rise to edema of her leg). Will recheck her ESR and LFTs (previously elevated). . Will plan to see her back in 3 months. Will also restart her flora-stor.

## 2012-06-13 NOTE — Progress Notes (Signed)
  Subjective:    Patient ID: [name], female    DOB: [birth year], 78 y.o.   MRN: [id number]  HPI 78 yo F with hx of MSSA infection of her R knee after arthorscopy (04-11-12). At home her knee became hot and painful, she underwent I & D on 04-17-12, posterior-operatively she was on ancef for 40 days (stopped early so she could travel). These were completed 05-28-12. She still has swelling in her knee. Has been wearing compression stocking for the last week.  Still feels like she has instability in her knee. Has been wearing a brace and needs to have a cane. Denies pain. Feels less stable than prior to her original surgery.  No fever or chills. She still has some mild heat in her R knee vs L.  States that after her plane trip she had aspiration of the knee that didn't retrieve any fluid. She has also had u/s (06-05-12): No evidence of deep venous thrombosis in the right  lower extremity. Multiple varicosities and perforators are patent.  There is either a hemorrhagic or debris filled Baker's cyst in the  popliteal fossa. There is soft tissue edema.    Review of Systems  Constitutional: Negative for appetite change and unexpected weight change.  Gastrointestinal: Negative for diarrhea and constipation.  Genitourinary: Negative for difficulty urinating.       Objective:   Physical Exam  Constitutional: She appears well-developed and well-nourished.  Musculoskeletal:       Legs:         Assessment & Plan:

## 2012-06-18 ENCOUNTER — Telehealth: Payer: Self-pay | Admitting: *Deleted

## 2012-06-18 NOTE — Telephone Encounter (Signed)
RN shared Sed Rate results and the pt was very pleased.  Pt continues to take oral antibiotics, probiotics and has return appt in February, 2014.  Pt verbalized continuance of this plan.

## 2012-07-24 ENCOUNTER — Encounter (HOSPITAL_COMMUNITY): Payer: Self-pay | Admitting: *Deleted

## 2012-07-24 ENCOUNTER — Emergency Department (HOSPITAL_COMMUNITY): Payer: Medicare Other

## 2012-07-24 ENCOUNTER — Emergency Department (HOSPITAL_COMMUNITY)
Admission: EM | Admit: 2012-07-24 | Discharge: 2012-07-24 | Disposition: A | Discharge status: Home / Self Care | Payer: Medicare Other | Attending: Emergency Medicine | Admitting: Emergency Medicine

## 2012-07-24 DIAGNOSIS — I10 Essential (primary) hypertension: Secondary | ICD-10-CM | POA: Insufficient documentation

## 2012-07-24 DIAGNOSIS — Y939 Activity, unspecified: Secondary | ICD-10-CM | POA: Insufficient documentation

## 2012-07-24 DIAGNOSIS — Z853 Personal history of malignant neoplasm of breast: Secondary | ICD-10-CM | POA: Insufficient documentation

## 2012-07-24 DIAGNOSIS — F3289 Other specified depressive episodes: Secondary | ICD-10-CM | POA: Insufficient documentation

## 2012-07-24 DIAGNOSIS — Z7982 Long term (current) use of aspirin: Secondary | ICD-10-CM | POA: Insufficient documentation

## 2012-07-24 DIAGNOSIS — F329 Major depressive disorder, single episode, unspecified: Secondary | ICD-10-CM | POA: Insufficient documentation

## 2012-07-24 DIAGNOSIS — Z8739 Personal history of other diseases of the musculoskeletal system and connective tissue: Secondary | ICD-10-CM | POA: Insufficient documentation

## 2012-07-24 DIAGNOSIS — S0100XA Unspecified open wound of scalp, initial encounter: Secondary | ICD-10-CM | POA: Insufficient documentation

## 2012-07-24 DIAGNOSIS — S0101XA Laceration without foreign body of scalp, initial encounter: Secondary | ICD-10-CM

## 2012-07-24 DIAGNOSIS — M810 Age-related osteoporosis without current pathological fracture: Secondary | ICD-10-CM | POA: Insufficient documentation

## 2012-07-24 DIAGNOSIS — Y929 Unspecified place or not applicable: Secondary | ICD-10-CM | POA: Insufficient documentation

## 2012-07-24 DIAGNOSIS — Z79899 Other long term (current) drug therapy: Secondary | ICD-10-CM | POA: Insufficient documentation

## 2012-07-24 DIAGNOSIS — W010XXA Fall on same level from slipping, tripping and stumbling without subsequent striking against object, initial encounter: Secondary | ICD-10-CM | POA: Insufficient documentation

## 2012-07-24 DIAGNOSIS — E7800 Pure hypercholesterolemia, unspecified: Secondary | ICD-10-CM | POA: Insufficient documentation

## 2012-07-24 NOTE — ED Notes (Signed)
Assessed pt.  Remains a/o at this time.

## 2012-07-24 NOTE — ED Provider Notes (Signed)
History     CSN: [id number]  Arrival date & time 07/24/12  4098   First MD Initiated Contact with Patient 07/24/12 1956      Chief Complaint  Patient presents with  . Head Laceration    HPI The patient reports slipping and falling and injuring the back of her head.  No neck pain.  No weakness of her upper lower extremities.  She was getting ready to head to a concert.  She denies headache at this time.  She has no pain.  She requested the laceration be repaired.  No vomiting.  No anticoagulant use.  Symptoms are mild.  Pain is 0/10   Past Medical History  Diagnosis Date  . Hypertension   . High cholesterol   . Complication of anesthesia 04/18/2012    "didn't tolerate it today very well; had the shakes and very hard time w/it"  . Breast cancer   . Arthritis     "in my back"  . Depression   . Osteoporosis     Past Surgical History  Procedure Date  . Knee arthroscopy 04/17/2012    Procedure: ARTHROSCOPY KNEE;  Surgeon: Raymon Mutton, MD;  Location: [facility] OR;  Service: Orthopedics;  Laterality: Right;  . I&d extremity 04/17/2012    Procedure: IRRIGATION AND DEBRIDEMENT EXTREMITY;  Surgeon: Raymon Mutton, MD;  Location: [facility] OR;  Service: Orthopedics;  Laterality: Right;  . Tonsillectomy and adenoidectomy     "I was a child"  . Appendectomy   . Cholecystectomy   . Posterior laminectomy / decompression lumbar spine 1980's  . Mastectomy 1982    left  . Breast lumpectomy     left  . Breast biopsy     left  . Menisectomy 04/11/2012    right    Family History  Problem Relation Age of Onset  . Kidney failure Mother     History  Substance Use Topics  . Smoking status: Never Smoker   . Smokeless tobacco: Never Used  . Alcohol Use: No     Comment: 04/18/2012 "couple drinks of vodka/day til ~ 6 yr ago; nothing in the last 6 yrs"    OB History    Grav Para Term Preterm Abortions TAB SAB Ect Mult Living                  Review of Systems  All other systems reviewed  and are negative.    Allergies  Oysters and Codeine  Home Medications   Current Outpatient Rx  Name  Route  Sig  Dispense  Refill  . ASPIRIN 81 MG PO TBEC   Oral   Take 1 tablet (81 mg total) by mouth daily. Swallow whole.   30 tablet   0   . CELECOXIB 200 MG PO CAPS   Oral   Take 200 mg by mouth 2 (two) times daily.         Marland Kitchen VITAMIN D3 2000 UNITS PO TABS   Oral   Take 2 tablets by mouth every morning.         Marland Kitchen ESCITALOPRAM OXALATE 10 MG PO TABS   Oral   Take 10 mg by mouth every morning.          Marland Kitchen METHOCARBAMOL 500 MG PO TABS   Oral   Take 1 tablet (500 mg total) by mouth every 6 (six) hours as needed.   60 tablet   0   . ONE-DAILY MULTI VITAMINS PO TABS   Oral  Take 1 tablet by mouth every morning.          . OXYCODONE HCL ER 10 MG PO TB12   Oral   Take 10 mg by mouth every 12 (twelve) hours.         Marland Kitchen SACCHAROMYCES BOULARDII 250 MG PO CAPS   Oral   Take 1 capsule (250 mg total) by mouth 2 (two) times daily.   60 capsule   1   . SIMVASTATIN 20 MG PO TABS   Oral   Take 20 mg by mouth every morning.          . TRIAMTERENE-HCTZ 75-50 MG PO TABS   Oral   Take 1 tablet by mouth daily.           BP 148/64  Pulse 79  Temp 97.6 F (36.4 C) (Oral)  Resp 16  SpO2 98%  Physical Exam  Nursing note and vitals reviewed. Constitutional: She is oriented to person, place, and time. She appears well-developed and well-nourished. No distress.  HENT:  Head: Normocephalic and atraumatic.       Centimeter laceration to her posterior scalp without evidence of active bleeding.  Small hematoma  Eyes: EOM are normal.  Neck: Normal range of motion.       No cervical spine tenderness.  C-spine cleared by Nexus criteria.  Cardiovascular: Normal rate, regular rhythm and normal heart sounds.   Pulmonary/Chest: Effort normal and breath sounds normal.  Abdominal: Soft. She exhibits no distension. There is no tenderness.  Musculoskeletal: Normal range  of motion.  Neurological: She is alert and oriented to person, place, and time.  Skin: Skin is warm and dry.  Psychiatric: She has a normal mood and affect. Judgment normal.    ED Course  Procedures (including critical care time)  LACERATION REPAIR Performed by: Lyanne Co Consent: Verbal consent obtained. Risks and benefits: risks, benefits and alternatives were discussed Patient identity confirmed: provided demographic data Time out performed prior to procedure Prepped and Draped in normal sterile fashion Wound explored Laceration Location: Posterior scalp Laceration Length: 2 cm No Foreign Bodies seen or palpated Anesthesia: local infiltration Local anesthetic: lidocaine 2 % with epinephrine Anesthetic total: 5 ml Irrigation method: syringe Amount of cleaning: standard Skin closure: Staples  Number of sutures or staples: 5  Technique: Staples Patient tolerance: Patient tolerated the procedure well with no immediate complications.   Labs Reviewed - No data to display Ct Head Wo Contrast  07/24/2012  *RADIOLOGY REPORT*  Clinical Data: Fall, injury to back of head  CT HEAD WITHOUT CONTRAST  Technique:  Contiguous axial images were obtained from the base of the skull through the vertex without contrast.  Comparison: Head CT 01/18/2011  Findings: There is small scalp hematoma over the left occipital bone. No intracranial hemorrhage.  No parenchymal contusion.  No midline shift or mass effect.  Basilar cisterns are patent. No skull base fracture.  No fluid in the paranasal sinuses or mastoid air cells.  There is atrophy and microvascular disease unchanged from prior.  IMPRESSION: No intracranial trauma.  Small scalp hematoma   Original Report Authenticated By: Genevive Bi, M.D.    I personally reviewed the imaging tests through PACS system I reviewed available ER/hospitalization records through the EMR   1. Scalp laceration       MDM  CT head negative.  Laceration  of her scalp repaired.  Head injury and infection warnings given.  Discharge home in good condition.  C-spine cleared by Nexus criteria  Lyanne Co, MD 07/24/12 [phone]

## 2012-07-24 NOTE — ED Notes (Signed)
Pt states she lost her balance, fell backwards striking the back of head. Pt presents w/ head laceration, bleeding somewhat controlled.

## 2012-08-24 ENCOUNTER — Telehealth: Payer: Self-pay | Admitting: *Deleted

## 2012-08-24 NOTE — Telephone Encounter (Signed)
Patient's husband called, asking for ESR results from 06/13/12, as she has an upcoming doctor's appointment next week.  He missed a call from Dr. Ninetta Lights earlier this week and would like for Dr. Ninetta Lights to call him at his earliest convenience at [phone].  Patient has a f/u at [facility] on 09/17/12. Andree Coss, RN

## 2012-08-27 NOTE — Telephone Encounter (Signed)
Called husband, got voice mail, left detailed message. Will try to call again.

## 2012-09-04 ENCOUNTER — Telehealth: Payer: Self-pay | Admitting: Infectious Diseases

## 2012-09-04 NOTE — Telephone Encounter (Signed)
Spoke with pt's husband. They have gone to [place] at [facility] for second opinion.  Previously the plan was to defer her TKR til this fall. They are planning to do this sooner provided her ESR is normal, and she is off anbx for 3 months. She will intra-operative path done prior to implantation.

## 2012-09-17 ENCOUNTER — Encounter: Payer: Self-pay | Admitting: Infectious Diseases

## 2012-09-17 ENCOUNTER — Ambulatory Visit (INDEPENDENT_AMBULATORY_CARE_PROVIDER_SITE_OTHER): Payer: Medicare Other | Admitting: Infectious Diseases

## 2012-09-17 VITALS — BP 146/74 | HR 73 | Temp 97.8°F | Ht 66.5 in | Wt 153.0 lb

## 2012-09-17 DIAGNOSIS — M009 Pyogenic arthritis, unspecified: Secondary | ICD-10-CM

## 2012-09-17 NOTE — Progress Notes (Signed)
  Subjective:    Patient ID: [name], female    DOB: Aug 16, 1931, 79 y.o.   MRN: [id number]  HPI 79 yo F with hx of MSSA infection of her R knee after arthorscopy (04-11-12). At home her knee became hot and painful, she underwent I & D on 04-17-12 (ESR 105), posterior-operatively she was on ancef for 40 days (stopped early so she could travel). These were completed 05-28-12. Was seen in f/u on 11/20 and was given keflex for 3 months. ESR 25.  Pt and husband have been to [facility], for eval, 2nd opinion. Will get op specimen and determine if she should proceed with TKR (vs anbx spacer).   Has been off anbx for 4 weeks now, is going to ortho tomorrow. They are hoping to have early TKR if her ESR/CRP continue to be low.   Review of Systems     Objective:   Physical Exam  Constitutional: She appears well-developed and well-nourished.  Musculoskeletal:       Legs:         Assessment & Plan:

## 2012-09-17 NOTE — Assessment & Plan Note (Addendum)
She appears to be doing well off anbx. Will recheck her inflammatory markers today. She is hopeful that she will have normal labs so that she can proceed with replacement of her infected TKR. She will f/u with Dr Valentina Gu. Will f/u with ID prn, awaiting decision on her surgery.

## 2012-09-18 LAB — C-REACTIVE PROTEIN: CRP: <0.5 mg/dL (ref <0.60)

## 2012-09-18 LAB — SEDIMENTATION RATE: Sed Rate: 4 mm/hr (ref 0–22)

## 2012-10-02 ENCOUNTER — Other Ambulatory Visit: Payer: Self-pay | Admitting: Orthopedic Surgery

## 2012-10-02 MED ORDER — DEXAMETHASONE SODIUM PHOSPHATE 10 MG/ML IJ SOLN: 10.0000 mg | Freq: Once | INTRAMUSCULAR | Status: DC

## 2012-10-02 MED ORDER — BUPIVACAINE LIPOSOME 1.3 % IJ SUSP: 20.0000 mL | Freq: Once | INTRAMUSCULAR | Status: DC

## 2012-10-02 NOTE — Progress Notes (Signed)
Preoperative surgical orders have been place into the Epic hospital system for [name] on 10/02/2012, 11:24 AM  by Patrica Duel for surgery on 10/29/2012.  Preop Total Knee orders including Experal, IV Tylenol, and IV Decadron as long as there are no contraindications to the above medications. [name], PA-C

## 2012-10-17 ENCOUNTER — Encounter (HOSPITAL_COMMUNITY): Payer: Self-pay | Admitting: Pharmacy Technician

## 2012-10-19 NOTE — Patient Instructions (Signed)
[name]  10/19/2012   Your procedure is scheduled on: 10/29/12    Report to [facility] at  0500  AM.  Call this number if you have problems the morning of surgery: [phone]   Remember:   Do not eat food or drink liquids after midnight.   Take these medicines the morning of surgery with A SIP OF WATER:    Do not wear jewelry, make-up or nail polish.  Do not wear lotions, powders, or perfumes.   Do not shave 48 hours prior to surgery.   Do not bring valuables to the hospital.  Contacts, dentures or bridgework may not be worn into surgery.  Leave suitcase in the car. After surgery it may be brought to your room.  For patients admitted to the hospital, checkout time is 11:00 AM the day of  discharge.   SEE CHG INSTRUCTION SHEET    Please read over the following fact sheets that you were given: MRSA Information, coughing and deep breathing exercises, leg exercises, Blood Transfusion Fact sheet, Incentive Spirometry Fact Sheet                Failure to comply with these instructions may result in cancellation of your surgery.                Patient Signature ____________________________              Nurse Signature _____________________________

## 2012-10-22 ENCOUNTER — Encounter (HOSPITAL_COMMUNITY)
Admission: RE | Admit: 2012-10-22 | Discharge: 2012-10-22 | Disposition: A | Discharge status: Home / Self Care | Payer: Medicare Other | Source: Ambulatory Visit | Attending: Orthopedic Surgery | Admitting: Orthopedic Surgery

## 2012-10-22 ENCOUNTER — Encounter (HOSPITAL_COMMUNITY): Payer: Self-pay

## 2012-10-22 ENCOUNTER — Ambulatory Visit (HOSPITAL_COMMUNITY)
Admission: RE | Admit: 2012-10-22 | Discharge: 2012-10-22 | Disposition: A | Discharge status: Home / Self Care | Payer: Medicare Other | Source: Ambulatory Visit | Attending: Orthopedic Surgery | Admitting: Orthopedic Surgery

## 2012-10-22 DIAGNOSIS — Z01812 Encounter for preprocedural laboratory examination: Secondary | ICD-10-CM | POA: Insufficient documentation

## 2012-10-22 DIAGNOSIS — M1710 Unilateral primary osteoarthritis, unspecified knee: Secondary | ICD-10-CM | POA: Insufficient documentation

## 2012-10-22 DIAGNOSIS — Z01818 Encounter for other preprocedural examination: Secondary | ICD-10-CM | POA: Insufficient documentation

## 2012-10-22 DIAGNOSIS — Z79899 Other long term (current) drug therapy: Secondary | ICD-10-CM | POA: Insufficient documentation

## 2012-10-22 DIAGNOSIS — I10 Essential (primary) hypertension: Secondary | ICD-10-CM | POA: Insufficient documentation

## 2012-10-22 HISTORY — DX: Peripheral vascular disease, unspecified: I73.9

## 2012-10-22 LAB — SURGICAL PCR SCREEN
MRSA, PCR: NEGATIVE
Staphylococcus aureus: NEGATIVE

## 2012-10-22 LAB — COMPREHENSIVE METABOLIC PANEL
ALT: 21 U/L (ref 0–35)
Alkaline Phosphatase: 78 U/L (ref 39–117)
CO2: 30 mEq/L (ref 19–32)
Chloride: 96 mEq/L (ref 96–112)
GFR calc Af Amer: >90 mL/min (ref >90)
Glucose, Bld: 87 mg/dL (ref 70–99)
Potassium: 3.8 mEq/L (ref 3.5–5.1)
Sodium: 136 mEq/L (ref 135–145)
Total Bilirubin: 0.4 mg/dL (ref 0.3–1.2)
Total Protein: 7.4 g/dL (ref 6.0–8.3)

## 2012-10-22 LAB — CBC
Hemoglobin: 13.1 g/dL (ref 12.0–15.0)
MCHC: 33.3 g/dL (ref 30.0–36.0)
RBC: 4.28 MIL/uL (ref 3.87–5.11)
WBC: 7.2 10*3/uL (ref 4.0–10.5)

## 2012-10-22 LAB — URINALYSIS, ROUTINE W REFLEX MICROSCOPIC
Hgb urine dipstick: NEGATIVE
Nitrite: NEGATIVE
Specific Gravity, Urine: 1.021 (ref 1.005–1.030)
Urobilinogen, UA: 0.2 mg/dL (ref 0.0–1.0)

## 2012-10-22 LAB — APTT: aPTT: 30 seconds (ref 24–37)

## 2012-10-22 LAB — ABO/RH: ABO/RH(D): A POS

## 2012-10-22 NOTE — Progress Notes (Signed)
CXR results faxed via EPIC to Dr Lequita Halt.

## 2012-10-22 NOTE — Progress Notes (Signed)
Called and spoke with Marchelle Folks at [facility].  Wanted to make sure Dr Lequita Halt and nurse were aware of CXR results sent earlier.  Marchelle Folks stated she would notify them to make sure they had seen CXR results from today done on preop appointment.  She stated she would have them call me at [phone] .

## 2012-10-22 NOTE — Progress Notes (Signed)
Urinalysis with micro results along with CMP results faxed via EPIC to Dr Lequita Halt.

## 2012-10-22 NOTE — Progress Notes (Signed)
Clearance note on chart from Dr Jacky Kindle dates 10/18/12.

## 2012-10-23 ENCOUNTER — Other Ambulatory Visit: Payer: Self-pay | Admitting: Orthopedic Surgery

## 2012-10-23 DIAGNOSIS — J984 Other disorders of lung: Secondary | ICD-10-CM

## 2012-10-23 LAB — URINE CULTURE: Colony Count: NO GROWTH

## 2012-10-25 ENCOUNTER — Ambulatory Visit
Admission: RE | Admit: 2012-10-25 | Discharge: 2012-10-25 | Disposition: A | Discharge status: Home / Self Care | Payer: Medicare Other | Source: Ambulatory Visit | Attending: Orthopedic Surgery | Admitting: Orthopedic Surgery

## 2012-10-25 DIAGNOSIS — J984 Other disorders of lung: Secondary | ICD-10-CM

## 2012-10-25 MED ORDER — IOHEXOL 300 MG/ML  SOLN
75.0000 mL | Freq: Once | INTRAMUSCULAR | Status: AC | PRN
  Administered 2012-10-25: 75 mL via INTRAVENOUS

## 2012-10-25 NOTE — Progress Notes (Signed)
CT of chest done 10/25/12.

## 2012-10-27 NOTE — Anesthesia Preprocedure Evaluation (Addendum)
Anesthesia Evaluation  Patient identified by MRN, date of birth, ID band Patient awake    Reviewed: Allergy & Precautions, H&P , NPO status , Patient's Chart, lab work & pertinent test results  Airway Mallampati: II TM Distance: >3 FB Neck ROM: Full    Dental no notable dental hx.    Pulmonary pneumonia -, resolved,  breath sounds clear to auscultation  Pulmonary exam normal       Cardiovascular Exercise Tolerance: Good hypertension, Pt. on medications + Peripheral Vascular Disease Rhythm:Regular Rate:Normal     Neuro/Psych negative neurological ROS  negative psych ROS   GI/Hepatic negative GI ROS, Neg liver ROS,   Endo/Other  negative endocrine ROS  Renal/GU negative Renal ROS  negative genitourinary   Musculoskeletal negative musculoskeletal ROS (+)   Abdominal   Peds negative pediatric ROS (+)  Hematology negative hematology ROS (+)   Anesthesia Other Findings   Reproductive/Obstetrics negative OB ROS                           Anesthesia Physical Anesthesia Plan  ASA: II  Anesthesia Plan: Spinal   Post-op Pain Management:    Induction: Intravenous  Airway Management Planned:   Additional Equipment:   Intra-op Plan:   Post-operative Plan: Extubation in OR  Informed Consent: I have reviewed the patients History and Physical, chart, labs and discussed the procedure including the risks, benefits and alternatives for the proposed anesthesia with the patient or authorized representative who has indicated his/her understanding and acceptance.   Dental advisory given  Plan Discussed with: CRNA  Anesthesia Plan Comments: (Discussed general versus spinal. H/O back pain. Discussed risks/benefits of spinal including headache, backache, failure, bleeding, infection, and nerve damage. Patient consents to spinal. Questions answered. Coagulation studies and platelet count acceptable.)        Anesthesia Quick Evaluation

## 2012-10-28 ENCOUNTER — Other Ambulatory Visit: Payer: Self-pay | Admitting: Orthopedic Surgery

## 2012-10-28 NOTE — H&P (Signed)
[name]  DOB: 08/12/1931 Married / Language: [place]; Flemish / Race: White Female  Date of Admission:  10/29/2012  Chief Complaint:  Right Knee Pain  History of Present Illness The patient is a 79 year old female who comes in today for a preoperative History and Physical. The patient is scheduled for a right total knee arthroplasty to be performed by Dr. Gus Rankin. Aluisio, MD at [facility] on 10/29/2012. The patient is a 79 year old female who presents with knee complaints. The patient is seen for a second opinion. The patient reports right knee symptoms including: pain and swelling which began after knee atroscopy. She states her knee became infected with staph two days post op. Current treatment includes non-opioid analgesics (Tylenol). [name]'s history dates back to September when she had a knee arthroscopy by Dr. Queen Blossom and had evidence of arthritis and a lateral meniscal tear. She had a debridement and did well for about 48 hours then started to develop pain and swelling. The pain is severe enough where she went to the emergency room. She was told at the emergency room that it may potentially be infection. She did not have any aspiration in the emergency room. She went home and two days later had worsening problems. Dr. Valentina Gu saw her and ended up having to take her to surgery for a washout. She was on a PICC line with IV antibiotics for 40 days. She grew out methicillin sensitive staph aureus. She has been followed by Dr. Johny Sax with Infectious diseases. She had a sed rate performed in November which was 25. She is having a lot of pain in the knee especially activity related. She has pain at rest. She is having increasing stiffness in the knee also. She has not had much swelling. She has not had any fever, chills or warmth in the knee. She was recently seen in [place] by a doctor who was concerned about possible residual infection or possible masked  infection as she has remained on antibiotics. She was taken off antibiotics and has not had any flare up of infectious type symptoms. She had septic arthritis. She is now ready for surgery for the knee. We will get intraoperative frozen section culture. They have been treated conservatively in the past for the above stated problem and despite conservative measures, they continue to have progressive pain and severe functional limitations and dysfunction. They have failed non-operative management including home exercise, medications. It is felt that they would benefit from undergoing total joint replacement. Risks and benefits of the procedure have been discussed with the patient and they elect to proceed with surgery. There are no active contraindications to surgery such as rapidly progressive neurological disease. Any infectious concerns will be addressed during the procedure.   Problem List Primary osteoarthritis of one knee (715.16)   Allergies Codeine Derivatives. Nausea, Vomiting.   Family History Heart disease in female family member before age 61 Heart Disease. father Osteoarthritis. mother Kidney disease. mother   Social History Illicit drug use. no Exercise. Exercises daily Marital status. married Living situation. live with spouse Drug/Alcohol Rehab (Previously). yes Children. 2 Alcohol use. former drinker Drug/Alcohol Rehab (Currently). 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 2-3 Tobacco use. never smoker Tobacco / smoke exposure. no   Medication History Tylenol Extra Strength (500MG  Tablet, Oral) Active. CeleBREX (200MG  Capsule, Oral) Active. Escitalopram Oxalate (10MG  Tablet, Oral) Active. Simvastatin (20MG  Tablet, Oral) Active. Multivitamin ( Oral)  Specific dose unknown - Active. Vitamin D3 (2000UNIT Tablet, Oral) Active. Triamterene-HCTZ (75-50MG  Tablet, Oral) Active. Forteo  (600MCG/2.4ML Solution, Subcutaneous) Active.   Past Surgical History Tonsillectomy Spinal Surgery Rotator Cuff Repair. right Breast Mass; Local Excision. left Arthroscopy of Knee. right Leg Circulation Surgery. bilateral Dilation and Curettage of Uterus   Medical History Osteoporosis Osteoarthritis Skin Cancer Peripheral Vascular Disease Hypercholesterolemia High blood pressure Breast Cancer Pneumonia. 1971 Alcoholism. Recovery for 6 1/2 years Polio. Upper Body Hemorrhoids   Review of Systems General:Present- Fatigue. Not Present- Chills, Fever, Night Sweats, Weight Gain, Weight Loss and Memory Loss. Skin:Not Present- Hives, Itching, Rash, Eczema and Lesions. HEENT:Not Present- Tinnitus, Headache, Double Vision, Visual Loss, Hearing Loss and Dentures. Respiratory:Not Present- Shortness of breath with exertion, Shortness of breath at rest, Allergies, Coughing up blood and Chronic Cough. Cardiovascular:Not Present- Chest Pain, Racing/skipping heartbeats, Difficulty Breathing Lying Down, Murmur, Swelling and Palpitations. Gastrointestinal:Not Present- Bloody Stool, Heartburn, Abdominal Pain, Vomiting, Nausea, Constipation, Diarrhea, Difficulty Swallowing, Jaundice and Loss of appetitie. Female Genitourinary:Not Present- Blood in Urine, Urinary frequency, Weak urinary stream, Discharge, Flank Pain, Incontinence, Painful Urination, Urgency, Urinary Retention and Urinating at Night. Musculoskeletal:Present- Joint Swelling, Joint Pain and Morning Stiffness. Not Present- Muscle Weakness, Muscle Pain, Back Pain and Spasms. Neurological:Not Present- Tremor, Dizziness, Blackout spells, Paralysis, Difficulty with balance and Weakness. Psychiatric:Not Present- Insomnia.   Vitals Weight: 154 lb Height: 66.5 in Body Surface Area: 1.81 m Body Mass Index: 24.48 kg/m Pulse: 72 (Regular) Resp.: 16 (Unlabored) BP: 138/78 (Sitting, Right Arm,  Standard)    Physical Exam The physical exam findings are as follows:  Note: Patient is a 79 year old female with continued knee pain.   General Mental Status - Alert, cooperative and good historian. General Appearance- Very pleasant. Not in acute distress. Orientation- Oriented X3. Build & Nutrition- Petite, Well nourished and Well developed.   Head and Neck Head- normocephalic, atraumatic . Neck Global Assessment- supple. no bruit auscultated on the right and no bruit auscultated on the left.   Eye Vision- Wears corrective lenses. Pupil- Bilateral- Regular and Round. Motion- Bilateral- EOMI.   Chest and Lung Exam Inspection:Shape- Kyphotic and Scoliotic. Auscultation: Breath sounds:- clear at anterior chest wall and - clear at posterior chest wall. Adventitious sounds:- No Adventitious sounds.   Cardiovascular Auscultation:Rhythm- Regular rate and rhythm. Heart Sounds- S1 WNL and S2 WNL. Murmurs & Other Heart Sounds:Auscultation of the heart reveals - No Murmurs.   Abdomen Palpation/Percussion:Tenderness- Abdomen is non-tender to palpation. Rigidity (guarding)- Abdomen is soft. Auscultation:Auscultation of the abdomen reveals - Bowel sounds normal.   Female Genitourinary Not done, not pertinent to present illness  Musculoskeletal On exam, well developed female alert and oriented in no apparent distress. Evaluation of her hips show normal range of motion with no discomfort. Evaluation of her left knee range is about 0-125. No swelling, tenderness or instability. Right knee no warmth or effusion. No evidence of any significant bogginess or thickening of the soft tissues about the knee. She has slight valgus. Range is about 5-125. Marked crepitus on range of motion. Tenderness lateral greater than medial with no instability noted. Pulse, sensation and motor intact distally.  RADIOGRAPHS: AP both knees and lateral of the right  show tricompartmental bone on bone changes in the right knee. She does not have any bony erosions.  Assessment & Plan Primary osteoarthritis of one knee (715.16) Impression: Right Knee  Note: Plan is for a Right Total Knee Replacement by Dr. Lequita Halt.  Plan is to go to [facility]  Signed electronically by Roberts Gaudy, PA-C

## 2012-10-29 ENCOUNTER — Encounter (HOSPITAL_COMMUNITY): Payer: Self-pay | Admitting: Anesthesiology

## 2012-10-29 ENCOUNTER — Encounter (HOSPITAL_COMMUNITY)
Admission: RE | Disposition: A | Discharge status: Skilled Nursing Facility | Payer: Self-pay | Source: Ambulatory Visit | Attending: Orthopedic Surgery

## 2012-10-29 ENCOUNTER — Encounter (HOSPITAL_COMMUNITY): Payer: Self-pay | Admitting: *Deleted

## 2012-10-29 ENCOUNTER — Ambulatory Visit (HOSPITAL_COMMUNITY): Payer: Medicare Other | Admitting: Anesthesiology

## 2012-10-29 ENCOUNTER — Inpatient Hospital Stay (HOSPITAL_COMMUNITY)
Admission: RE | Admit: 2012-10-29 | Discharge: 2012-11-01 | DRG: 470 | Disposition: A | Discharge status: Skilled Nursing Facility | Payer: Medicare Other | Source: Ambulatory Visit | Attending: Orthopedic Surgery | Admitting: Orthopedic Surgery

## 2012-10-29 DIAGNOSIS — E7800 Pure hypercholesterolemia, unspecified: Secondary | ICD-10-CM | POA: Diagnosis present

## 2012-10-29 DIAGNOSIS — E871 Hypo-osmolality and hyponatremia: Secondary | ICD-10-CM

## 2012-10-29 DIAGNOSIS — Z853 Personal history of malignant neoplasm of breast: Secondary | ICD-10-CM

## 2012-10-29 DIAGNOSIS — Z96651 Presence of right artificial knee joint: Secondary | ICD-10-CM

## 2012-10-29 DIAGNOSIS — D62 Acute posthemorrhagic anemia: Secondary | ICD-10-CM

## 2012-10-29 DIAGNOSIS — M810 Age-related osteoporosis without current pathological fracture: Secondary | ICD-10-CM | POA: Diagnosis present

## 2012-10-29 DIAGNOSIS — I10 Essential (primary) hypertension: Secondary | ICD-10-CM | POA: Diagnosis present

## 2012-10-29 DIAGNOSIS — M179 Osteoarthritis of knee, unspecified: Secondary | ICD-10-CM | POA: Diagnosis present

## 2012-10-29 DIAGNOSIS — E876 Hypokalemia: Secondary | ICD-10-CM

## 2012-10-29 DIAGNOSIS — I739 Peripheral vascular disease, unspecified: Secondary | ICD-10-CM | POA: Diagnosis present

## 2012-10-29 DIAGNOSIS — M1710 Unilateral primary osteoarthritis, unspecified knee: Principal | ICD-10-CM | POA: Diagnosis present

## 2012-10-29 HISTORY — PX: TOTAL KNEE ARTHROPLASTY: SHX125

## 2012-10-29 LAB — TYPE AND SCREEN: Antibody Screen: NEGATIVE

## 2012-10-29 LAB — GRAM STAIN

## 2012-10-29 SURGERY — ARTHROPLASTY, KNEE, TOTAL
Anesthesia: Spinal | Site: Knee | Laterality: Right | Wound class: Clean

## 2012-10-29 MED ORDER — ESCITALOPRAM OXALATE 10 MG PO TABS
10.0000 mg | ORAL_TABLET | Freq: Every morning | ORAL | Status: DC
  Administered 2012-10-29 – 2012-11-01 (×4): 10 mg via ORAL
  Filled 2012-10-29 (×4): qty 1

## 2012-10-29 MED ORDER — 0.9 % SODIUM CHLORIDE (POUR BTL) OPTIME
TOPICAL | Status: DC | PRN
  Administered 2012-10-29: 1000 mL

## 2012-10-29 MED ORDER — BUPIVACAINE LIPOSOME 1.3 % IJ SUSP
INTRAMUSCULAR | Status: DC | PRN
  Administered 2012-10-29: 20 mL

## 2012-10-29 MED ORDER — FLEET ENEMA 7-19 GM/118ML RE ENEM: 1.0000 | ENEMA | Freq: Once | RECTAL | Status: AC | PRN

## 2012-10-29 MED ORDER — CEFAZOLIN SODIUM-DEXTROSE 2-3 GM-% IV SOLR
2.0000 g | INTRAVENOUS | Status: AC
  Administered 2012-10-29: 2 g via INTRAVENOUS

## 2012-10-29 MED ORDER — ACETAMINOPHEN 650 MG RE SUPP: 650.0000 mg | Freq: Four times a day (QID) | RECTAL | Status: DC | PRN

## 2012-10-29 MED ORDER — POLYETHYLENE GLYCOL 3350 17 G PO PACK: 17.0000 g | PACK | Freq: Every day | ORAL | Status: DC | PRN

## 2012-10-29 MED ORDER — METOCLOPRAMIDE HCL 10 MG PO TABS: 5.0000 mg | ORAL_TABLET | Freq: Three times a day (TID) | ORAL | Status: DC | PRN

## 2012-10-29 MED ORDER — CEFAZOLIN SODIUM 1-5 GM-% IV SOLN
1.0000 g | Freq: Four times a day (QID) | INTRAVENOUS | Status: AC
  Administered 2012-10-29 (×2): 1 g via INTRAVENOUS
  Filled 2012-10-29 (×2): qty 50

## 2012-10-29 MED ORDER — LACTATED RINGERS IV SOLN: INTRAVENOUS | Status: DC

## 2012-10-29 MED ORDER — ACETAMINOPHEN 325 MG PO TABS: 650.0000 mg | ORAL_TABLET | Freq: Four times a day (QID) | ORAL | Status: DC | PRN

## 2012-10-29 MED ORDER — SODIUM CHLORIDE 0.9 % IV SOLN: INTRAVENOUS | Status: DC

## 2012-10-29 MED ORDER — BUPIVACAINE LIPOSOME 1.3 % IJ SUSP
20.0000 mL | Freq: Once | INTRAMUSCULAR | Status: DC
  Filled 2012-10-29: qty 20

## 2012-10-29 MED ORDER — TRIAMTERENE-HCTZ 75-50 MG PO TABS
1.0000 | ORAL_TABLET | Freq: Every day | ORAL | Status: DC
  Administered 2012-10-31 – 2012-11-01 (×2): 1 via ORAL
  Filled 2012-10-29 (×4): qty 1

## 2012-10-29 MED ORDER — PHENOL 1.4 % MT LIQD: 1.0000 | OROMUCOSAL | Status: DC | PRN

## 2012-10-29 MED ORDER — ACETAMINOPHEN 10 MG/ML IV SOLN: 1000.0000 mg | Freq: Once | INTRAVENOUS | Status: DC

## 2012-10-29 MED ORDER — ACETAMINOPHEN 10 MG/ML IV SOLN
1000.0000 mg | Freq: Four times a day (QID) | INTRAVENOUS | Status: AC
  Administered 2012-10-29 – 2012-10-30 (×3): 1000 mg via INTRAVENOUS
  Filled 2012-10-29 (×7): qty 100

## 2012-10-29 MED ORDER — ONDANSETRON HCL 4 MG PO TABS: 4.0000 mg | ORAL_TABLET | Freq: Four times a day (QID) | ORAL | Status: DC | PRN

## 2012-10-29 MED ORDER — CHLORHEXIDINE GLUCONATE 4 % EX LIQD
60.0000 mL | Freq: Once | CUTANEOUS | Status: DC
  Filled 2012-10-29: qty 60

## 2012-10-29 MED ORDER — FENTANYL CITRATE 0.05 MG/ML IJ SOLN
INTRAMUSCULAR | Status: DC | PRN
  Administered 2012-10-29 (×2): 50 ug via INTRAVENOUS

## 2012-10-29 MED ORDER — METHOCARBAMOL 100 MG/ML IJ SOLN: 500.0000 mg | Freq: Four times a day (QID) | INTRAVENOUS | Status: DC | PRN

## 2012-10-29 MED ORDER — TRAMADOL HCL 50 MG PO TABS
50.0000 mg | ORAL_TABLET | Freq: Four times a day (QID) | ORAL | Status: DC | PRN
  Administered 2012-10-31 – 2012-11-01 (×3): 100 mg via ORAL
  Filled 2012-10-29 (×3): qty 2

## 2012-10-29 MED ORDER — CEFAZOLIN SODIUM-DEXTROSE 2-3 GM-% IV SOLR
INTRAVENOUS | Status: AC
  Filled 2012-10-29: qty 50

## 2012-10-29 MED ORDER — SODIUM CHLORIDE 0.9 % IR SOLN
Status: DC | PRN
  Administered 2012-10-29: 3000 mL

## 2012-10-29 MED ORDER — PROPOFOL INFUSION 10 MG/ML OPTIME
INTRAVENOUS | Status: DC | PRN
  Administered 2012-10-29: 140 ug/kg/min via INTRAVENOUS

## 2012-10-29 MED ORDER — DOCUSATE SODIUM 100 MG PO CAPS
100.0000 mg | ORAL_CAPSULE | Freq: Two times a day (BID) | ORAL | Status: DC
  Administered 2012-10-29 – 2012-11-01 (×6): 100 mg via ORAL

## 2012-10-29 MED ORDER — SIMVASTATIN 20 MG PO TABS
20.0000 mg | ORAL_TABLET | Freq: Every morning | ORAL | Status: DC
  Administered 2012-10-29 – 2012-11-01 (×4): 20 mg via ORAL
  Filled 2012-10-29 (×4): qty 1

## 2012-10-29 MED ORDER — DEXAMETHASONE 6 MG PO TABS
10.0000 mg | ORAL_TABLET | Freq: Once | ORAL | Status: AC
  Administered 2012-10-30: 10 mg via ORAL
  Filled 2012-10-29: qty 1

## 2012-10-29 MED ORDER — HYDROMORPHONE HCL 2 MG PO TABS
2.0000 mg | ORAL_TABLET | ORAL | Status: DC | PRN
  Administered 2012-10-29: 4 mg via ORAL
  Administered 2012-10-29 (×2): 2 mg via ORAL
  Administered 2012-10-30 (×2): 4 mg via ORAL
  Administered 2012-10-30: 2 mg via ORAL
  Administered 2012-10-30: 4 mg via ORAL
  Administered 2012-10-30: 2 mg via ORAL
  Administered 2012-10-31: 4 mg via ORAL
  Administered 2012-10-31: 2 mg via ORAL
  Filled 2012-10-29: qty 1
  Filled 2012-10-29: qty 2
  Filled 2012-10-29 (×2): qty 1
  Filled 2012-10-29: qty 2
  Filled 2012-10-29 (×2): qty 1
  Filled 2012-10-29 (×3): qty 2

## 2012-10-29 MED ORDER — RIVAROXABAN 10 MG PO TABS
10.0000 mg | ORAL_TABLET | Freq: Every day | ORAL | Status: DC
  Administered 2012-10-30 – 2012-11-01 (×3): 10 mg via ORAL
  Filled 2012-10-29 (×5): qty 1

## 2012-10-29 MED ORDER — METOCLOPRAMIDE HCL 5 MG/ML IJ SOLN: 5.0000 mg | Freq: Three times a day (TID) | INTRAMUSCULAR | Status: DC | PRN

## 2012-10-29 MED ORDER — HYDROMORPHONE HCL PF 1 MG/ML IJ SOLN: 0.2500 mg | INTRAMUSCULAR | Status: DC | PRN

## 2012-10-29 MED ORDER — KCL IN DEXTROSE-NACL 20-5-0.9 MEQ/L-%-% IV SOLN
INTRAVENOUS | Status: DC
  Administered 2012-10-29 – 2012-10-30 (×2): via INTRAVENOUS
  Filled 2012-10-29 (×3): qty 1000

## 2012-10-29 MED ORDER — MORPHINE SULFATE 2 MG/ML IJ SOLN
1.0000 mg | INTRAMUSCULAR | Status: DC | PRN
  Administered 2012-10-29 (×2): 2 mg via INTRAVENOUS
  Filled 2012-10-29 (×2): qty 1

## 2012-10-29 MED ORDER — LACTATED RINGERS IV SOLN
INTRAVENOUS | Status: DC | PRN
  Administered 2012-10-29 (×2): via INTRAVENOUS

## 2012-10-29 MED ORDER — MENTHOL 3 MG MT LOZG: 1.0000 | LOZENGE | OROMUCOSAL | Status: DC | PRN

## 2012-10-29 MED ORDER — MIDAZOLAM HCL 5 MG/5ML IJ SOLN
INTRAMUSCULAR | Status: DC | PRN
  Administered 2012-10-29 (×2): 1 mg via INTRAVENOUS

## 2012-10-29 MED ORDER — ONDANSETRON HCL 4 MG/2ML IJ SOLN: 4.0000 mg | Freq: Four times a day (QID) | INTRAMUSCULAR | Status: DC | PRN

## 2012-10-29 MED ORDER — METHOCARBAMOL 500 MG PO TABS: 500.0000 mg | ORAL_TABLET | Freq: Four times a day (QID) | ORAL | Status: DC | PRN

## 2012-10-29 MED ORDER — ONDANSETRON HCL 4 MG/2ML IJ SOLN
INTRAMUSCULAR | Status: DC | PRN
  Administered 2012-10-29: 4 mg via INTRAVENOUS

## 2012-10-29 MED ORDER — DEXAMETHASONE SODIUM PHOSPHATE 10 MG/ML IJ SOLN: 10.0000 mg | Freq: Once | INTRAMUSCULAR | Status: AC

## 2012-10-29 MED ORDER — BISACODYL 10 MG RE SUPP: 10.0000 mg | Freq: Every day | RECTAL | Status: DC | PRN

## 2012-10-29 MED ORDER — SODIUM CHLORIDE 0.9 % IJ SOLN
INTRAMUSCULAR | Status: DC | PRN
  Administered 2012-10-29: 50 mL via INTRAVENOUS

## 2012-10-29 MED ORDER — DIPHENHYDRAMINE HCL 12.5 MG/5ML PO ELIX: 12.5000 mg | ORAL_SOLUTION | ORAL | Status: DC | PRN

## 2012-10-29 MED ORDER — BUPIVACAINE IN DEXTROSE 0.75-8.25 % IT SOLN
INTRATHECAL | Status: DC | PRN
  Administered 2012-10-29: 2 mL via INTRATHECAL

## 2012-10-29 MED ORDER — DEXTROSE 5 % IV SOLN: 3.0000 g | INTRAVENOUS | Status: DC

## 2012-10-29 SURGICAL SUPPLY — 55 items
BAG ZIPLOCK 12X15 (MISCELLANEOUS) ×2 IMPLANT
BANDAGE ELASTIC 6 VELCRO ST LF (GAUZE/BANDAGES/DRESSINGS) ×2 IMPLANT
BANDAGE ESMARK 6X9 LF (GAUZE/BANDAGES/DRESSINGS) ×1 IMPLANT
BLADE SAG 18X100X1.27 (BLADE) ×2 IMPLANT
BLADE SAW SGTL 11.0X1.19X90.0M (BLADE) ×2 IMPLANT
BNDG ESMARK 6X9 LF (GAUZE/BANDAGES/DRESSINGS) ×2
BONE CEMENT GENTAMICIN (Cement) ×4 IMPLANT
BOWL SMART MIX CTS (DISPOSABLE) ×2 IMPLANT
CEMENT BONE GENTAMICIN 40 (Cement) ×2 IMPLANT
CLOTH BEACON ORANGE TIMEOUT ST (SAFETY) ×2 IMPLANT
CLSR STERI-STRIP ANTIMIC 1/2X4 (GAUZE/BANDAGES/DRESSINGS) ×2 IMPLANT
CUFF TOURN SGL QUICK 34 (TOURNIQUET CUFF) ×1
CUFF TRNQT CYL 34X4X40X1 (TOURNIQUET CUFF) ×1 IMPLANT
DRAPE EXTREMITY T 121X128X90 (DRAPE) ×2 IMPLANT
DRAPE POUCH INSTRU U-SHP 10X18 (DRAPES) ×2 IMPLANT
DRAPE U-SHAPE 47X51 STRL (DRAPES) ×2 IMPLANT
DRSG ADAPTIC 3X8 NADH LF (GAUZE/BANDAGES/DRESSINGS) ×2 IMPLANT
DRSG EMULSION OIL 3X16 NADH (GAUZE/BANDAGES/DRESSINGS) ×2 IMPLANT
DURAPREP 26ML APPLICATOR (WOUND CARE) ×2 IMPLANT
ELECT REM PT RETURN 9FT ADLT (ELECTROSURGICAL) ×2
ELECTRODE REM PT RTRN 9FT ADLT (ELECTROSURGICAL) ×1 IMPLANT
EVACUATOR 1/8 PVC DRAIN (DRAIN) ×2 IMPLANT
FACESHIELD LNG OPTICON STERILE (SAFETY) ×10 IMPLANT
GLOVE BIO SURGEON STRL SZ7.5 (GLOVE) ×2 IMPLANT
GLOVE BIO SURGEON STRL SZ8 (GLOVE) ×2 IMPLANT
GLOVE BIOGEL PI IND STRL 8 (GLOVE) ×2 IMPLANT
GLOVE BIOGEL PI INDICATOR 8 (GLOVE) ×2
GLOVE SURG SS PI 6.5 STRL IVOR (GLOVE) ×4 IMPLANT
GOWN STRL NON-REIN LRG LVL3 (GOWN DISPOSABLE) ×4 IMPLANT
GOWN STRL REIN XL XLG (GOWN DISPOSABLE) ×2 IMPLANT
HANDPIECE INTERPULSE COAX TIP (DISPOSABLE) ×1
IMMOBILIZER KNEE 20 (SOFTGOODS) ×2
IMMOBILIZER KNEE 20 THIGH 36 (SOFTGOODS) ×1 IMPLANT
KIT BASIN OR (CUSTOM PROCEDURE TRAY) ×2 IMPLANT
MANIFOLD NEPTUNE II (INSTRUMENTS) ×2 IMPLANT
NDL SAFETY ECLIPSE 18X1.5 (NEEDLE) ×1 IMPLANT
NEEDLE HYPO 18GX1.5 SHARP (NEEDLE) ×1
NS IRRIG 1000ML POUR BTL (IV SOLUTION) ×2 IMPLANT
PACK TOTAL JOINT (CUSTOM PROCEDURE TRAY) ×2 IMPLANT
PAD ABD 7.5X8 STRL (GAUZE/BANDAGES/DRESSINGS) ×2 IMPLANT
PADDING CAST COTTON 6X4 STRL (CAST SUPPLIES) ×2 IMPLANT
POSITIONER SURGICAL ARM (MISCELLANEOUS) ×2 IMPLANT
SET HNDPC FAN SPRY TIP SCT (DISPOSABLE) ×1 IMPLANT
SPONGE GAUZE 4X4 12PLY (GAUZE/BANDAGES/DRESSINGS) ×2 IMPLANT
STRIP CLOSURE SKIN 1/2X4 (GAUZE/BANDAGES/DRESSINGS) ×4 IMPLANT
SUCTION FRAZIER 12FR DISP (SUCTIONS) ×2 IMPLANT
SUT MNCRL AB 4-0 PS2 18 (SUTURE) ×2 IMPLANT
SUT VIC AB 2-0 CT1 27 (SUTURE) ×3
SUT VIC AB 2-0 CT1 TAPERPNT 27 (SUTURE) ×3 IMPLANT
SUT VLOC 180 0 24IN GS25 (SUTURE) ×2 IMPLANT
SYR 50ML LL SCALE MARK (SYRINGE) ×2 IMPLANT
TOWEL OR 17X26 10 PK STRL BLUE (TOWEL DISPOSABLE) ×4 IMPLANT
TRAY FOLEY CATH 14FRSI W/METER (CATHETERS) ×2 IMPLANT
WATER STERILE IRR 1500ML POUR (IV SOLUTION) ×2 IMPLANT
WRAP KNEE MAXI GEL POST OP (GAUZE/BANDAGES/DRESSINGS) ×4 IMPLANT

## 2012-10-29 NOTE — Interval H&P Note (Signed)
History and Physical Interval Note:  10/29/2012 6:42 AM  [name]  has presented today for surgery, with the diagnosis of OA RIGHT KNEE   The various methods of treatment have been discussed with the patient and family. After consideration of risks, benefits and other options for treatment, the patient has consented to  Procedure(s): RIGHT TOTAL KNEE ARTHROPLASTY (Right) as a surgical intervention .  The patient's history has been reviewed, patient examined, no change in status, stable for surgery.  I have reviewed the patient's chart and labs.  Questions were answered to the patient's satisfaction.     Loanne Drilling

## 2012-10-29 NOTE — Transfer of Care (Signed)
Immediate Anesthesia Transfer of Care Note  Patient: [name]  Procedure(s) Performed: Procedure(s): RIGHT TOTAL KNEE ARTHROPLASTY (Right)  Patient Location: PACU  Anesthesia Type:Spinal  Level of Consciousness: awake, alert  and oriented  Airway & Oxygen Therapy: Patient Spontanous Breathing and Patient connected to face mask oxygen  Post-op Assessment: Report given to PACU RN and Post -op Vital signs reviewed and stable  Post vital signs: Reviewed and stable  Complications: No apparent anesthesia complications

## 2012-10-29 NOTE — Progress Notes (Signed)
PT Cancellation Note  Patient Details Name: [name] MRN: [id number] DOB: [birth year]   Cancelled Treatment:    Reason Eval/Treat Not Completed: Pain limiting ability to participate;Other (comment) Pt's  pain better but states she just got comfortable and prefers to really not do therapy today at all; Will see in am.  [name], PT Pager: [phone] 10/29/2012  [name] 10/29/2012, 3:01 PM

## 2012-10-29 NOTE — Anesthesia Procedure Notes (Signed)

## 2012-10-29 NOTE — Preoperative (Signed)
Beta Blockers   Reason not to administer Beta Blockers:Not Applicable 

## 2012-10-29 NOTE — H&P (View-Only) (Signed)
[name]  DOB: 04/13/1932 Married / Language: Dutch; Flemish / Race: White Female  Date of Admission:  10/29/2012  Chief Complaint:  Right Knee Pain  History of Present Illness The patient is a 80 year old female who comes in today for a preoperative History and Physical. The patient is scheduled for a right total knee arthroplasty to be performed by Dr. Frank V. Aluisio, MD at [facility] on 10/29/2012. The patient is a 80 year old female who presents with knee complaints. The patient is seen for a second opinion. The patient reports right knee symptoms including: pain and swelling which began after knee atroscopy. She states her knee became infected with staph two days post op. Current treatment includes non-opioid analgesics (Tylenol). [name]'s history dates back to September when she had a knee arthroscopy by Dr. Steven Lucy and had evidence of arthritis and a lateral meniscal tear. She had a debridement and did well for about 48 hours then started to develop pain and swelling. The pain is severe enough where she went to the emergency room. She was told at the emergency room that it may potentially be infection. She did not have any aspiration in the emergency room. She went home and two days later had worsening problems. Dr. Lucy saw her and ended up having to take her to surgery for a washout. She was on a PICC line with IV antibiotics for 40 days. She grew out methicillin sensitive staph aureus. She has been followed by Dr. Jeffrey Hatcher with Infectious diseases. She had a sed rate performed in November which was 25. She is having a lot of pain in the knee especially activity related. She has pain at rest. She is having increasing stiffness in the knee also. She has not had much swelling. She has not had any fever, chills or warmth in the knee. She was recently seen in [place] by a doctor who was concerned about possible residual infection or possible masked  infection as she has remained on antibiotics. She was taken off antibiotics and has not had any flare up of infectious type symptoms. She had septic arthritis. She is now ready for surgery for the knee. We will get intraoperative frozen section culture. They have been treated conservatively in the past for the above stated problem and despite conservative measures, they continue to have progressive pain and severe functional limitations and dysfunction. They have failed non-operative management including home exercise, medications. It is felt that they would benefit from undergoing total joint replacement. Risks and benefits of the procedure have been discussed with the patient and they elect to proceed with surgery. There are no active contraindications to surgery such as rapidly progressive neurological disease. Any infectious concerns will be addressed during the procedure.   Problem List Primary osteoarthritis of one knee (715.16)   Allergies Codeine Derivatives. Nausea, Vomiting.   Family History Heart disease in female family member before age 55 Heart Disease. father Osteoarthritis. mother Kidney disease. mother   Social History Illicit drug use. no Exercise. Exercises daily Marital status. married Living situation. live with spouse Drug/Alcohol Rehab (Previously). yes Children. 2 Alcohol use. former drinker Drug/Alcohol Rehab (Currently). no Current work status. retired Pain Contract. no Number of flights of stairs before winded. 2-3 Tobacco use. never smoker Tobacco / smoke exposure. no   Medication History Tylenol Extra Strength (500MG Tablet, Oral) Active. CeleBREX (200MG Capsule, Oral) Active. Escitalopram Oxalate (10MG Tablet, Oral) Active. Simvastatin (20MG Tablet, Oral) Active. Multivitamin ( Oral)   Specific dose unknown - Active. Vitamin D3 (2000UNIT Tablet, Oral) Active. Triamterene-HCTZ (75-50MG Tablet, Oral) Active. Forteo  (600MCG/2.4ML Solution, Subcutaneous) Active.   Past Surgical History Tonsillectomy Spinal Surgery Rotator Cuff Repair. right Breast Mass; Local Excision. left Arthroscopy of Knee. right Leg Circulation Surgery. bilateral Dilation and Curettage of Uterus   Medical History Osteoporosis Osteoarthritis Skin Cancer Peripheral Vascular Disease Hypercholesterolemia High blood pressure Breast Cancer Pneumonia. 1971 Alcoholism. Recovery for 6 1/2 years Polio. Upper Body Hemorrhoids   Review of Systems General:Present- Fatigue. Not Present- Chills, Fever, Night Sweats, Weight Gain, Weight Loss and Memory Loss. Skin:Not Present- Hives, Itching, Rash, Eczema and Lesions. HEENT:Not Present- Tinnitus, Headache, Double Vision, Visual Loss, Hearing Loss and Dentures. Respiratory:Not Present- Shortness of breath with exertion, Shortness of breath at rest, Allergies, Coughing up blood and Chronic Cough. Cardiovascular:Not Present- Chest Pain, Racing/skipping heartbeats, Difficulty Breathing Lying Down, Murmur, Swelling and Palpitations. Gastrointestinal:Not Present- Bloody Stool, Heartburn, Abdominal Pain, Vomiting, Nausea, Constipation, Diarrhea, Difficulty Swallowing, Jaundice and Loss of appetitie. Female Genitourinary:Not Present- Blood in Urine, Urinary frequency, Weak urinary stream, Discharge, Flank Pain, Incontinence, Painful Urination, Urgency, Urinary Retention and Urinating at Night. Musculoskeletal:Present- Joint Swelling, Joint Pain and Morning Stiffness. Not Present- Muscle Weakness, Muscle Pain, Back Pain and Spasms. Neurological:Not Present- Tremor, Dizziness, Blackout spells, Paralysis, Difficulty with balance and Weakness. Psychiatric:Not Present- Insomnia.   Vitals Weight: 154 lb Height: 66.5 in Body Surface Area: 1.81 m Body Mass Index: 24.48 kg/m Pulse: 72 (Regular) Resp.: 16 (Unlabored) BP: 138/78 (Sitting, Right Arm,  Standard)    Physical Exam The physical exam findings are as follows:  Note: Patient is a 80 year old female with continued knee pain.   General Mental Status - Alert, cooperative and good historian. General Appearance- Very pleasant. Not in acute distress. Orientation- Oriented X3. Build & Nutrition- Petite, Well nourished and Well developed.   Head and Neck Head- normocephalic, atraumatic . Neck Global Assessment- supple. no bruit auscultated on the right and no bruit auscultated on the left.   Eye Vision- Wears corrective lenses. Pupil- Bilateral- Regular and Round. Motion- Bilateral- EOMI.   Chest and Lung Exam Inspection:Shape- Kyphotic and Scoliotic. Auscultation: Breath sounds:- clear at anterior chest wall and - clear at posterior chest wall. Adventitious sounds:- No Adventitious sounds.   Cardiovascular Auscultation:Rhythm- Regular rate and rhythm. Heart Sounds- S1 WNL and S2 WNL. Murmurs & Other Heart Sounds:Auscultation of the heart reveals - No Murmurs.   Abdomen Palpation/Percussion:Tenderness- Abdomen is non-tender to palpation. Rigidity (guarding)- Abdomen is soft. Auscultation:Auscultation of the abdomen reveals - Bowel sounds normal.   Female Genitourinary Not done, not pertinent to present illness  Musculoskeletal On exam, well developed female alert and oriented in no apparent distress. Evaluation of her hips show normal range of motion with no discomfort. Evaluation of her left knee range is about 0-125. No swelling, tenderness or instability. Right knee no warmth or effusion. No evidence of any significant bogginess or thickening of the soft tissues about the knee. She has slight valgus. Range is about 5-125. Marked crepitus on range of motion. Tenderness lateral greater than medial with no instability noted. Pulse, sensation and motor intact distally.  RADIOGRAPHS: AP both knees and lateral of the right  show tricompartmental bone on bone changes in the right knee. She does not have any bony erosions.  Assessment & Plan Primary osteoarthritis of one knee (715.16) Impression: Right Knee  Note: Plan is for a Right Total Knee Replacement by Dr. Aluisio.  Plan is to go to [facility]  Signed electronically by DREW L Jania Steinke, PA-C  

## 2012-10-29 NOTE — Op Note (Signed)
Pre-operative diagnosis- Osteoarthritis  Right knee(s)  Post-operative diagnosis- Osteoarthritis Right knee(s)  Procedure-  Right  Total Knee Arthroplasty  Surgeon- Gus Rankin. Lasheena Frieze, MD  Assistant- Dimitri Ped, PA-C   Anesthesia-  Spinal EBL-* No blood loss amount entered *  Drains Hemovac  Tourniquet time- 49 minutes @ 300 mm Hg Complications- None  Condition-PACU - hemodynamically stable.   Brief Clinical Note  [name] is a 78 y.o. year old female with end stage OA of her right knee with progressively worsening pain and dysfunction. She has constant pain, with activity and at rest and significant functional deficits with difficulties even with ADLs. She has had extensive non-op management including analgesics, injections of cortisone and home exercise program, but remains in significant pain with significant dysfunction. She had a septic knee post-arthroscopy, treated with I & D and IV antibiotics and pre-op labs and aspiration show no residual infection.Radiographs show bone on bone arthritis all 3 compartments. She presents now for right Total Knee Arthroplasty.    Procedure in detail---   The patient is brought into the operating room and positioned supine on the operating table. After successful administration of  Spinal,   a tourniquet is placed high on the  Right thigh(s) and the lower extremity is prepped and draped in the usual sterile fashion. Time out is performed by the operating team and then the  Right lower extremity is wrapped in Esmarch, knee flexed and the tourniquet inflated to 300 mmHg.       A midline incision is made with a ten blade through the subcutaneous tissue to the level of the extensor mechanism. A fresh blade is used to make a medial parapatellar arthrotomy. Soft tissue over the proximal medial tibia is subperiosteally elevated to the joint line with a knife and into the semimembranosus bursa with a Cobb elevator. Soft tissue over the proximal lateral  tibia is elevated with attention being paid to avoiding the patellar tendon on the tibial tubercle. The patella is everted, knee flexed 90 degrees and the ACL and PCL are removed. Findings are bone on bone all 3 compartments. Stat gram stain shows rare WBCs and no organisms and frozen section x 2 shows no acute inflammation. It is decided that she is free of infection and I decided to proceed with TKA instead of resection arthroplasty.        The drill is used to create a starting hole in the distal femur and the canal is thoroughly irrigated with sterile saline to remove the fatty contents. The 5 degree Right  valgus alignment guide is placed into the femoral canal and the distal femoral cutting block is pinned to remove 10 mm off the distal femur. Resection is made with an oscillating saw.      The tibia is subluxed forward and the menisci are removed. The extramedullary alignment guide is placed referencing proximally at the medial aspect of the tibial tubercle and distally along the second metatarsal axis and tibial crest. The block is pinned to remove 2mm off the more deficient medial  side. Resection is made with an oscillating saw. Size 4is the most appropriate size for the tibia and the proximal tibia is prepared with the modular drill and keel punch for that size.      The femoral sizing guide is placed and size 3 is most appropriate. Rotation is marked off the epicondylar axis and confirmed by creating a rectangular flexion gap at 90 degrees. The size 3 cutting block is  pinned in this rotation and the anterior, posterior and chamfer cuts are made with the oscillating saw. The intercondylar block is then placed and that cut is made.      Trial size 4 tibial component, trial size 3 posterior stabilized femur and a 12.5  mm posterior stabilized rotating platform insert trial is placed. Full extension is achieved with excellent varus/valgus and anterior/posterior balance throughout full range of motion.  The patella is everted and thickness measured to be 23  mm. Free hand resection is taken to 13 mm, a 38 template is placed, lug holes are drilled, trial patella is placed, and it tracks normally. Osteophytes are removed off the posterior femur with the trial in place. All trials are removed and the cut bone surfaces prepared with pulsatile lavage. Cement is mixed and once ready for implantation, the size 4 tibial implant, size  3 posterior stabilized femoral component, and the size 38 patella are cemented in place and the patella is held with the clamp. The trial insert is placed and the knee held in full extension. The Exparel (20 ml mixed with 50 ml saline) is injected into the extensor mechanism, posterior capsule, medial and lateral gutters and subcutaneous tissues.  All extruded cement is removed and once the cement is hard the permanent 12.5 mm posterior stabilized rotating platform insert is placed into the tibial tray.      The wound is copiously irrigated with saline solution and the extensor mechanism closed over a hemovac drain with #1 PDS suture. The tourniquet is released for a total tourniquet time of 49  minutes. Flexion against gravity is 140 degrees and the patella tracks normally. Subcutaneous tissue is closed with 2.0 vicryl and subcuticular with running 4.0 Monocryl. The incision is cleaned and dried and steri-strips and a bulky sterile dressing are applied. The limb is placed into a knee immobilizer and the patient is awakened and transported to recovery in stable condition.      Please note that a surgical assistant was a medical necessity for this procedure in order to perform it in a safe and expeditious manner. Surgical assistant was necessary to retract the ligaments and vital neurovascular structures to prevent injury to them and also necessary for proper positioning of the limb to allow for anatomic placement of the prosthesis.   Gus Rankin Mayleigh Tetrault, MD    10/29/2012, 8:17 AM

## 2012-10-29 NOTE — Plan of Care (Signed)
Problem: Consults Goal: Diagnosis- Total Joint Replacement Right total knee     

## 2012-10-29 NOTE — Anesthesia Postprocedure Evaluation (Signed)
  Anesthesia Post-op Note  Patient: [name]  Procedure(s) Performed: Procedure(s) (LRB): RIGHT TOTAL KNEE ARTHROPLASTY (Right)  Patient Location: PACU  Anesthesia Type: Spinal  Level of Consciousness: awake and alert   Airway and Oxygen Therapy: Patient Spontanous Breathing  Post-op Pain: mild  Post-op Assessment: Post-op Vital signs reviewed, Patient's Cardiovascular Status Stable, Respiratory Function Stable, Patent Airway and No signs of Nausea or vomiting  Last Vitals:  Filed Vitals:   10/29/12 0945  BP: 118/53  Pulse: 52  Temp: 36.4 C  Resp: 11    Post-op Vital Signs: stable   Complications: No apparent anesthesia complications. Spinal has regressed 3 levels.

## 2012-10-30 ENCOUNTER — Encounter (HOSPITAL_COMMUNITY): Payer: Self-pay | Admitting: Orthopedic Surgery

## 2012-10-30 LAB — BASIC METABOLIC PANEL
CO2: 34 mEq/L — ABNORMAL HIGH (ref 19–32)
Calcium: 8.4 mg/dL (ref 8.4–10.5)
Creatinine, Ser: 0.68 mg/dL (ref 0.50–1.10)
GFR calc non Af Amer: 82 mL/min — ABNORMAL LOW (ref >90)
Glucose, Bld: 131 mg/dL — ABNORMAL HIGH (ref 70–99)

## 2012-10-30 LAB — CBC
Hemoglobin: 9.3 g/dL — ABNORMAL LOW (ref 12.0–15.0)
MCH: 31.4 pg (ref 26.0–34.0)
MCHC: 34.2 g/dL (ref 30.0–36.0)
MCV: 91.9 fL (ref 78.0–100.0)
Platelets: 135 10*3/uL — ABNORMAL LOW (ref 150–400)
RBC: 2.96 MIL/uL — ABNORMAL LOW (ref 3.87–5.11)

## 2012-10-30 MED ORDER — POTASSIUM CHLORIDE CRYS ER 20 MEQ PO TBCR
40.0000 meq | EXTENDED_RELEASE_TABLET | Freq: Three times a day (TID) | ORAL | Status: AC
  Administered 2012-10-30 (×2): 40 meq via ORAL
  Filled 2012-10-30 (×3): qty 2

## 2012-10-30 NOTE — Progress Notes (Signed)
   Subjective: 1 Day Post-Op Procedure(s) (LRB): RIGHT TOTAL KNEE ARTHROPLASTY (Right) Patient reports pain as mild and moderate last night but better this morning. Elevated temp last night but back down this morning. Patient seen in rounds with Dr. Lequita Halt. Patient is well, and has had no acute complaints or problems We will start therapy today.  Plan is to go Skilled nursing facility after hospital stay.  Looking into [facility].  Objective: Vital signs in last 24 hours: Temp:  [97.6 F (36.4 C)-102 F (38.9 C)] 99.4 F (37.4 C) (04/08 0600) Pulse Rate:  [51-81] 68 (04/08 0600) Resp:  [11-18] 16 (04/08 0600) BP: (112-163)/(51-69) 124/68 mmHg (04/08 0600) SpO2:  [97 %-100 %] 100 % (04/08 0600) Weight:  [70.761 kg (156 lb)] 70.761 kg (156 lb) (04/07 1110)  Intake/Output from previous day:  Intake/Output Summary (Last 24 hours) at 10/30/12 0904 Last data filed at 10/30/12 0618  Gross per 24 hour  Intake   2880 ml  Output   1620 ml  Net   1260 ml    Intake/Output this shift: UOP 200 since MN +2110  Labs:  Recent Labs  10/30/12 0443  HGB 9.3*    Recent Labs  10/30/12 0443  WBC 6.1  RBC 2.96*  HCT 27.2*  PLT 135*    Recent Labs  10/30/12 0443  NA 133*  K 2.8*  CL 96  CO2 34*  BUN 15  CREATININE 0.68  GLUCOSE 131*  CALCIUM 8.4   No results found for this basename: LABPT, INR,  in the last 72 hours  EXAM General - Patient is Alert, Appropriate and Oriented Extremity - Neurovascular intact Sensation intact distally Dorsiflexion/Plantar flexion intact Dressing - dressing C/D/I Motor Function - intact, moving foot and toes well on exam.  Hemovac pulled without difficulty.  Past Medical History  Diagnosis Date  . Hypertension   . High cholesterol   . Complication of anesthesia 04/18/2012    "didn't tolerate it today very well; had the shakes and very hard time w/it"  . Arthritis     "in my back"  . Osteoporosis   . Pneumonia     hx of  pneumonia- 1971  . Peripheral vascular disease     hx of ligation   . Breast cancer     left breast cancer     Assessment/Plan: 1 Day Post-Op Procedure(s) (LRB): RIGHT TOTAL KNEE ARTHROPLASTY (Right) Principal Problem:   OA (osteoarthritis) of knee  Estimated body mass index is 24.43 kg/(m^2) as calculated from the following:   Height as of this encounter: 5\' 7"  (1.702 m).   Weight as of this encounter: 70.761 kg (156 lb). Advance diet Up with therapy Discharge to SNF  DVT Prophylaxis - Xarelto Weight-Bearing as tolerated to right leg No vaccines. D/C O2 and Pulse OX and try on Room Air  Ardean Simonich, Marlowe Sax 10/30/2012, 9:04 AM

## 2012-10-30 NOTE — Evaluation (Signed)
Physical Therapy Evaluation Patient Details Name: [name] MRN: [id number] DOB: 07-09-1932 Today's Date: 10/30/2012 Time: 4098-1191 PT Time Calculation (min): 18 min  PT Assessment / Plan / Recommendation Clinical Impression  79 yo female s/p R TKA. Had some issues with pain control on yesterday, but pt tolerated session well today. On eval, pt required Min-Mod assist for mobility; able to ambulate ~20 feet with RW. Recommend SNF for continued rehab to improve strength, ROM, gait, and balance.     PT Assessment  Patient needs continued PT services    Follow Up Recommendations  SNF    Does the patient have the potential to tolerate intense rehabilitation      Barriers to Discharge        Equipment Recommendations  None recommended by PT    Recommendations for Other Services OT consult   Frequency 7X/week    Precautions / Restrictions Precautions Precautions: Knee;Fall Required Braces or Orthoses: Knee Immobilizer - Right Knee Immobilizer - Right: Discontinue once straight leg raise with < 10 degree lag Restrictions Weight Bearing Restrictions: No RLE Weight Bearing: Weight bearing as tolerated   Pertinent Vitals/Pain 6/10 R knee      Mobility  Bed Mobility Bed Mobility: Supine to Sit Supine to Sit: 4: Min assist Details for Bed Mobility Assistance: Increased time. VCs safety, technique, hand placement. Assist for R LE off bed.  Transfers Transfers: Sit to Stand;Stand to Sit Sit to Stand: 3: Mod assist;From bed;With upper extremity assist Stand to Sit: 3: Mod assist;To chair/3-in-1;With armrests;With upper extremity assist Details for Transfer Assistance: VCs safety, technique, hand placement. Assist to rise, stabilize, control descent Ambulation/Gait Ambulation/Gait Assistance: 4: Min assist Ambulation Distance (Feet): 20 Feet Assistive device: Rolling walker Ambulation/Gait Assistance Details: VCs safety, technique, sequence. slow gait speed. Tolerated well.   Gait Pattern: Step-to pattern;Trunk flexed;Decreased step length - right;Decreased stride length    Exercises     PT Diagnosis: Difficulty walking;Abnormality of gait;Acute pain  PT Problem List: Decreased strength;Decreased range of motion;Decreased activity tolerance;Decreased mobility;Decreased balance;Decreased knowledge of use of DME;Decreased knowledge of precautions;Pain PT Treatment Interventions: DME instruction;Gait training;Functional mobility training;Therapeutic activities;Therapeutic exercise;Balance training;Patient/family education   PT Goals Acute Rehab PT Goals PT Goal Formulation: With patient Time For Goal Achievement: 11/13/12 Potential to Achieve Goals: Good Pt will go Supine/Side to Sit: with supervision PT Goal: Supine/Side to Sit - Progress: Goal set today Pt will go Sit to Supine/Side: with supervision PT Goal: Sit to Supine/Side - Progress: Goal set today Pt will go Sit to Stand: with supervision PT Goal: Sit to Stand - Progress: Goal set today Pt will Ambulate: 51 - 150 feet;with supervision;with rolling walker PT Goal: Ambulate - Progress: Goal set today Pt will Perform Home Exercise Program: with supervision, verbal cues required/provided PT Goal: Perform Home Exercise Program - Progress: Goal set today  Visit Information  Last PT Received On: 10/30/12 Assistance Needed: +1    Subjective Data  Subjective: Dr Berton Lan said I would only get to the chair this am Patient Stated Goal: Better pain control. Regain independence   Prior Functioning  Home Living Lives With: Spouse Home Adaptive Equipment: Walker - rolling;Straight cane;Bedside commode/3-in-1 Prior Function Level of Independence: Independent with assistive device(s) Communication Communication: No difficulties    Cognition  Cognition Overall Cognitive Status: Appears within functional limits for tasks assessed/performed Arousal/Alertness: Awake/alert Orientation Level: Appears intact  for tasks assessed Behavior During Session: [facility] for tasks performed    Extremity/Trunk Assessment Right Lower Extremity Assessment RLE ROM/Strength/Tone:  Deficits RLE ROM/Strength/Tone Deficits: hip flex 2/5, hip abd/add 2/5, moves ankle well. Left Lower Extremity Assessment LLE ROM/Strength/Tone: WFL for tasks assessed Trunk Assessment Trunk Assessment: Kyphotic   Balance    End of Session PT - End of Session Equipment Utilized During Treatment: Gait belt;Right knee immobilizer Activity Tolerance: Patient tolerated treatment well Patient left: in chair;with call bell/phone within reach  GP     [name], MPT Pager: [phone]

## 2012-10-30 NOTE — Progress Notes (Signed)
Physical Therapy Treatment Patient Details Name: [name] MRN: [id number] DOB: [birth year] Today's Date: 10/30/2012 Time: 4098-1191 PT Time Calculation (min): 32 min  PT Assessment / Plan / Recommendation Comments on Treatment Session  Progressing slowly. Recommend SNF    Follow Up Recommendations  SNF     Does the patient have the potential to tolerate intense rehabilitation     Barriers to Discharge        Equipment Recommendations  None recommended by PT    Recommendations for Other Services    Frequency 7X/week   Plan Discharge plan remains appropriate    Precautions / Restrictions Precautions Precautions: Knee;Fall Required Braces or Orthoses: Knee Immobilizer - Right Knee Immobilizer - Right: Discontinue once straight leg raise with < 10 degree lag Restrictions Weight Bearing Restrictions: No RLE Weight Bearing: Weight bearing as tolerated   Pertinent Vitals/Pain 6/10 R knee    Mobility  Bed Mobility Bed Mobility: Supine to Sit;Sit to Supine Supine to Sit: 4: Min assist Sit to Supine: 4: Min assist Transfers Transfers: Sit to Stand;Stand to Sit Sit to Stand: 3: Mod assist;From bed;From toilet Stand to Sit: 3: Mod assist;4: Min assist;To bed;To toilet Details for Transfer Assistance: VCs safety, technique, hand placement. Assist to rise, stabilize, control descent Ambulation/Gait Ambulation Distance (Feet): 15 Feet (x 2) Assistive device: Rolling walker Gait Pattern: Step-to pattern;Antalgic;Trunk flexed;Decreased stride length    Exercises Total Joint Exercises Ankle Circles/Pumps: AROM;Both;10 reps;Supine Quad Sets: AROM;Both;10 reps;Supine Heel Slides: AAROM;Right;10 reps;Supine Straight Leg Raises: AAROM;Right;10 reps;Supine   PT Diagnosis:    PT Problem List:   PT Treatment Interventions:     PT Goals Acute Rehab PT Goals Pt will go Supine/Side to Sit: with supervision PT Goal: Supine/Side to Sit - Progress: Progressing toward goal Pt will  go Sit to Supine/Side: with supervision PT Goal: Sit to Supine/Side - Progress: Progressing toward goal Pt will go Sit to Stand: with supervision PT Goal: Sit to Stand - Progress: Progressing toward goal Pt will Ambulate: 51 - 150 feet;with supervision;with rolling walker PT Goal: Ambulate - Progress: Progressing toward goal Pt will Perform Home Exercise Program: with supervision, verbal cues required/provided PT Goal: Perform Home Exercise Program - Progress: Progressing toward goal  Visit Information  Last PT Received On: 10/30/12 Assistance Needed: +1    Subjective Data  Subjective: Im a little more sore this afternoon Patient Stated Goal: Better pain control. Regain independence   Cognition  Cognition Overall Cognitive Status: Appears within functional limits for tasks assessed/performed Arousal/Alertness: Awake/alert Orientation Level: Appears intact for tasks assessed Behavior During Session: [facility] for tasks performed    Balance     End of Session PT - End of Session Equipment Utilized During Treatment: Gait belt;Right knee immobilizer Activity Tolerance: Patient limited by pain Patient left: in bed;with call bell/phone within reach   GP     [name], MPT Pager: [phone]

## 2012-10-30 NOTE — Progress Notes (Signed)
UR completed 

## 2012-10-30 NOTE — Progress Notes (Signed)
Clinical Social Work Department BRIEF PSYCHOSOCIAL ASSESSMENT 10/30/2012  Patient:  ASTELLA, DESIR     Account Number:  [id number]     Admit date:  10/29/2012  Clinical Social Worker:  Candie Chroman  Date/Time:  10/30/2012 10:13 AM  Referred by:  Physician  Date Referred:  10/30/2012 Referred for  SNF Placement   Other Referral:   Interview type:  Patient Other interview type:    PSYCHOSOCIAL DATA Living Status:  HUSBAND Admitted from facility:   Level of care:   Primary support name:  [name] Primary support relationship to patient:  SPOUSE Degree of support available:   supportive    CURRENT CONCERNS Current Concerns  Post-Acute Placement   Other Concerns:    SOCIAL WORK ASSESSMENT / PLAN Pt is a 79 yr old female living at home prior to hospitalization. CSW met with pt to assist with d/c planning . Pt has made prior arrangements to have [employer] at [facility] following hospital d/c. CSW has contacted SNF and d/c plans have been confirmed.  CSW will continue to follow to assist with d/c planning to SNF and will begin authorization process for [facility].   Assessment/plan status:  Psychosocial Support/Ongoing Assessment of Needs Other assessment/ plan:   Information/referral to community resources:   None needed at this time.    PATIENT'S/FAMILY'S RESPONSE TO PLAN OF CARE: Pt is loking forward to having rehab at [facility].   Cori Razor LCSW [phone]

## 2012-10-30 NOTE — Progress Notes (Signed)
Clinical Social Work Department CLINICAL SOCIAL WORK PLACEMENT NOTE 10/30/2012  Patient:  [name], [name]  Account Number:  [id number] Admit date:  10/29/2012  Clinical Social Worker:  Cori Razor, LCSW  Date/time:  10/30/2012 10:18 AM  Clinical Social Work is seeking post-discharge placement for this patient at the following level of care:   SKILLED NURSING   (*CSW will update this form in Epic as items are completed)     Patient/family provided with [facility] Department of Clinical Social Work's list of facilities offering this level of care within the geographic area requested by the patient (or if unable, by the patient's family).  10/30/2012  Patient/family informed of their freedom to choose among providers that offer the needed level of care, that participate in Medicare, Medicaid or managed care program needed by the patient, have an available bed and are willing to accept the patient.  10/30/2012  Patient/family informed of [facility]' ownership interest in [facility], as well as of the fact that they are under no obligation to receive care at this facility.  PASARR submitted to EDS on 10/30/2012 PASARR number received from EDS on   FL2 transmitted to all facilities in geographic area requested by pt/family on  10/30/2012 FL2 transmitted to all facilities within larger geographic area on   Patient informed that his/her managed care company has contracts with or will negotiate with  certain facilities, including the following:     Patient/family informed of bed offers received:  10/30/2012 Patient chooses bed at [facility] Physician recommends and patient chooses bed at    Patient to be transferred to [facility] on   Patient to be transferred to facility by   The following physician request were entered in Epic:   Additional Comments: Cori Razor LCSW [phone]

## 2012-10-30 NOTE — Evaluation (Signed)
Occupational Therapy Evaluation Patient Details Name: [name] MRN: [id number] DOB: 1932/01/02 Today's Date: 10/30/2012 Time: 9147-8295 OT Time Calculation (min): 12 min  OT Assessment / Plan / Recommendation Clinical Impression  This 79 year old female was admitted for R TKA.  Her plan is to go to rehab prior to home.  Will follow in acute focusing on toilet transfers.     OT Assessment  Patient needs continued OT Services    Follow Up Recommendations  SNF    Barriers to Discharge      Equipment Recommendations  3 in 1 bedside comode    Recommendations for Other Services    Frequency  Min 2X/week    Precautions / Restrictions Precautions Precautions: Knee;Fall Required Braces or Orthoses: Knee Immobilizer - Right Knee Immobilizer - Right: Discontinue once straight leg raise with < 10 degree lag Restrictions Weight Bearing Restrictions: No RLE Weight Bearing: Weight bearing as tolerated   Pertinent Vitals/Pain 6/10 R LE:  Left with PT:  She will apply ice    ADL  Grooming: Min guard Where Assessed - Grooming: Supported standing Upper Body Bathing: Set up Where Assessed - Upper Body Bathing: Unsupported sitting Lower Body Bathing: Minimal assistance (mod A for sit to stand) Where Assessed - Lower Body Bathing: Supported sit to stand Upper Body Dressing: Minimal assistance Where Assessed - Upper Body Dressing: Unsupported sitting Lower Body Dressing: Minimal assistance (with AE) Where Assessed - Lower Body Dressing: Supported sit to Pharmacist, hospital: Minimal assistance (steps; mod A for sit to stand) Statistician Method: Sit to Barista: Comfort height toilet;Grab bars Toileting - Clothing Manipulation and Hygiene: Minimal assistance Where Assessed - Engineer, mining and Hygiene: Sit to stand from 3-in-1 or toilet Equipment Used: Rolling walker Transfers/Ambulation Related to ADLs: PT had pt in bathroom.  Took over  and completed eval from there.   ADL Comments: Pt has a sock aid at home that she uses    OT Diagnosis: Generalized weakness  OT Problem List: Decreased strength;Decreased activity tolerance;Decreased knowledge of use of DME or AE;Pain OT Treatment Interventions: Self-care/ADL training;DME and/or AE instruction;Patient/family education   OT Goals Acute Rehab OT Goals OT Goal Formulation: With patient Time For Goal Achievement: 11/06/12 Potential to Achieve Goals: Good ADL Goals Pt Will Perform Grooming: with supervision;Standing at sink ADL Goal: Grooming - Progress: Goal set today Pt Will Transfer to Toilet: with supervision;Ambulation;3-in-1 (with min assist for sit to stand) ADL Goal: Toilet Transfer - Progress: Goal set today  Visit Information  Last OT Received On: 10/30/12 Assistance Needed: +1    Subjective Data  Subjective: That's a long way (back to bed from commode) Patient Stated Goal: plans rehab   Prior Functioning     Home Living Lives With: Spouse Home Adaptive Equipment: Walker - rolling;Straight cane;Bedside commode/3-in-1 Prior Function Level of Independence: Independent with assistive device(s) Communication Communication: No difficulties         Vision/Perception     Cognition  Cognition Overall Cognitive Status: Appears within functional limits for tasks assessed/performed Arousal/Alertness: Awake/alert Orientation Level: Appears intact for tasks assessed Behavior During Session: [facility] for tasks performed    Extremity/Trunk Assessment Right Upper Extremity Assessment RUE ROM/Strength/Tone: [facility] for tasks assessed Left Upper Extremity Assessment LUE ROM/Strength/Tone: [facility] for tasks assessed     Mobility Bed Mobility Bed Mobility: Supine to Sit;Sit to Supine Supine to Sit: 4: Min assist Sit to Supine: 4: Min assist Transfers Sit to Stand: 3: Mod assist;From bed;From  toilet Stand to Sit: 3: Mod assist;4: Min assist;To bed;To  toilet Details for Transfer Assistance: VCs safety, technique, hand placement. Assist to rise, stabilize, control descent     Exercise    Balance     End of Session OT - End of Session Activity Tolerance: Patient limited by fatigue Patient left: in bed (with PT)  GO     Fidela Cieslak 10/30/2012, 4:28 PM Marica Otter, OTR/L [phone] 10/30/2012

## 2012-10-31 LAB — BASIC METABOLIC PANEL
BUN: 15 mg/dL (ref 6–23)
CO2: 31 mEq/L (ref 19–32)
Calcium: 8.5 mg/dL (ref 8.4–10.5)
Creatinine, Ser: 0.6 mg/dL (ref 0.50–1.10)
Glucose, Bld: 103 mg/dL — ABNORMAL HIGH (ref 70–99)

## 2012-10-31 LAB — CBC
HCT: 24.6 % — ABNORMAL LOW (ref 36.0–46.0)
Hemoglobin: 8.3 g/dL — ABNORMAL LOW (ref 12.0–15.0)
MCH: 31 pg (ref 26.0–34.0)
MCV: 91.8 fL (ref 78.0–100.0)
Platelets: 126 10*3/uL — ABNORMAL LOW (ref 150–400)
RBC: 2.68 MIL/uL — ABNORMAL LOW (ref 3.87–5.11)

## 2012-10-31 NOTE — Progress Notes (Signed)
Physical Therapy Treatment Patient Details Name: [name] MRN: [id number] DOB: Aug 06, 1931 Today's Date: 10/31/2012 Time: 1430-1456 PT Time Calculation (min): 26 min  PT Assessment / Plan / Recommendation Comments on Treatment Session       Follow Up Recommendations  SNF     Does the patient have the potential to tolerate intense rehabilitation     Barriers to Discharge        Equipment Recommendations  None recommended by PT    Recommendations for Other Services OT consult  Frequency 7X/week   Plan Discharge plan remains appropriate    Precautions / Restrictions     Pertinent Vitals/Pain 6/10 R knee    Mobility  Bed Mobility Bed Mobility: Supine to Sit;Sit to Supine Supine to Sit: 4: Min assist Sit to Supine: 4: Min assist Details for Bed Mobility Assistance: Increased time. VCs safety, technique, hand placement. Assist for R LE off/onto bed.  Transfers Transfers: Sit to Stand;Stand to Sit Sit to Stand: 3: Mod assist;From bed;From elevated surface Stand to Sit: 4: Min assist;To bed;To elevated surface Details for Transfer Assistance: VCs safety, technique, hand placement. Assist to rise, stabilize, control descent Ambulation/Gait Ambulation/Gait Assistance: 4: Min assist Ambulation Distance (Feet): 58 Feet Assistive device: Rolling walker Gait Pattern: Step-to pattern;Trunk flexed;Antalgic;Decreased stride length    Exercises     PT Diagnosis:    PT Problem List:   PT Treatment Interventions:     PT Goals Acute Rehab PT Goals Pt will go Supine/Side to Sit: with supervision PT Goal: Supine/Side to Sit - Progress: Progressing toward goal Pt will go Sit to Supine/Side: with supervision PT Goal: Sit to Supine/Side - Progress: Progressing toward goal Pt will go Sit to Stand: with supervision PT Goal: Sit to Stand - Progress: Progressing toward goal Pt will Ambulate: 51 - 150 feet;with supervision;with rolling walker PT Goal: Ambulate - Progress:  Progressing toward goal  Visit Information  Last PT Received On: 10/31/12 Assistance Needed: +1    Subjective Data  Subjective: I think its getting better. Im leaving tomorrow.  Patient Stated Goal: Better pain control. Regain independence   Cognition       Balance     End of Session PT - End of Session Equipment Utilized During Treatment: Gait belt;Right knee immobilizer Activity Tolerance: Patient tolerated treatment well Patient left: in bed;with call bell/phone within reach;with family/visitor present   GP     [name], MPT Pager: [phone]

## 2012-10-31 NOTE — Progress Notes (Signed)
   Subjective: 2 Days Post-Op Procedure(s) (LRB): RIGHT TOTAL KNEE ARTHROPLASTY (Right) Patient reports pain as mild and moderate pain last night but better today.  HGB down to 8.3.  No symptoms at this time. Patient seen in rounds with Dr. Lequita Halt. Patient is well, but has had some minor complaints of pain in the knee, requiring pain medications Plan is to go Skilled nursing facility after hospital stay.  Objective: Vital signs in last 24 hours: Temp:  [97.5 F (36.4 C)-98.7 F (37.1 C)] 97.5 F (36.4 C) (04/09 1342) Pulse Rate:  [72-76] 72 (04/09 1342) Resp:  [14-18] 16 (04/09 1342) BP: (105-116)/(61-66) 105/63 mmHg (04/09 1342) SpO2:  [92 %-96 %] 95 % (04/09 1342)  Intake/Output from previous day:  Intake/Output Summary (Last 24 hours) at 10/31/12 1427 Last data filed at 10/31/12 1342  Gross per 24 hour  Intake 1377.67 ml  Output    300 ml  Net 1077.67 ml    Intake/Output this shift: Total I/O In: 436.7 [P.O.:360; I.V.:76.7] Out: 300 [Urine:300]  Labs:  Recent Labs  10/30/12 0443 10/31/12 0421  HGB 9.3* 8.3*    Recent Labs  10/30/12 0443 10/31/12 0421  WBC 6.1 4.7  RBC 2.96* 2.68*  HCT 27.2* 24.6*  PLT 135* 126*    Recent Labs  10/30/12 0443 10/31/12 0421  NA 133* 134*  K 2.8* 3.6  CL 96 97  CO2 34* 31  BUN 15 15  CREATININE 0.68 0.60  GLUCOSE 131* 103*  CALCIUM 8.4 8.5   No results found for this basename: LABPT, INR,  in the last 72 hours  EXAM General - Patient is Alert, Appropriate and Oriented Extremity - Neurovascular intact Sensation intact distally Intact pulses distally No cellulitis present Dressing/Incision - clean, dry, no drainage, healing Motor Function - intact, moving foot and toes well on exam.   Past Medical History  Diagnosis Date  . Hypertension   . High cholesterol   . Complication of anesthesia 04/18/2012    "didn't tolerate it today very well; had the shakes and very hard time w/it"  . Arthritis     "in my  back"  . Osteoporosis   . Pneumonia     hx of pneumonia- 1971  . Peripheral vascular disease     hx of ligation   . Breast cancer     left breast cancer     Assessment/Plan: 2 Days Post-Op Procedure(s) (LRB): RIGHT TOTAL KNEE ARTHROPLASTY (Right) Principal Problem:   OA (osteoarthritis) of knee  Estimated body mass index is 24.43 kg/(m^2) as calculated from the following:   Height as of this encounter: 5\' 7"  (1.702 m).   Weight as of this encounter: 70.761 kg (156 lb). Discharge to SNF when improved  DVT Prophylaxis - Xarelto Weight-Bearing as tolerated to right leg  PERKINS, ALEXZANDREW 10/31/2012, 2:27 PM

## 2012-10-31 NOTE — Progress Notes (Signed)
Physical Therapy Treatment Patient Details Name: JUNICE FEI MRN: [id number] DOB: 04-29-1932 Today's Date: 10/31/2012 Time: 3086-5784 PT Time Calculation (min): 25 min  PT Assessment / Plan / Recommendation Comments on Treatment Session  Progressing slowly. Recommend SNF    Follow Up Recommendations  SNF     Does the patient have the potential to tolerate intense rehabilitation     Barriers to Discharge        Equipment Recommendations  None recommended by PT    Recommendations for Other Services OT consult  Frequency 7X/week   Plan Discharge plan remains appropriate    Precautions / Restrictions Precautions Precautions: Knee;Fall Required Braces or Orthoses: Knee Immobilizer - Right Knee Immobilizer - Right: Discontinue once straight leg raise with < 10 degree lag Restrictions Weight Bearing Restrictions: No RLE Weight Bearing: Weight bearing as tolerated   Pertinent Vitals/Pain 6/10 R knee    Mobility  Bed Mobility Bed Mobility: Supine to Sit Supine to Sit: 4: Min assist Details for Bed Mobility Assistance: Increased time. VCs safety, technique, hand placement. Assist for R LE off bed.  Transfers Transfers: Sit to Stand;Stand to Sit Sit to Stand: 3: Mod assist;From bed Stand to Sit: 4: Min assist;To chair/3-in-1 Details for Transfer Assistance: VCs safety, technique, hand placement. Assist to rise, stabilize, control descent Ambulation/Gait Ambulation/Gait Assistance: 4: Min assist Ambulation Distance (Feet): 35 Feet Assistive device: Rolling walker Ambulation/Gait Assistance Details: VCs safety, posture, distance from RW. Slow gait speed.  Gait Pattern: Step-to pattern;Decreased stride length;Antalgic;Trunk flexed    Exercises Total Joint Exercises Ankle Circles/Pumps: AROM;Both;15 reps;Seated Quad Sets: AROM;Right;10 reps;Seated Short Arc Quad: AAROM;Right;10 reps;Seated Heel Slides: AAROM;Right;10 reps;Seated Hip ABduction/ADduction: AAROM;Right;10  reps;Seated Straight Leg Raises: AAROM;Right;10 reps;Seated   PT Diagnosis:    PT Problem List:   PT Treatment Interventions:     PT Goals Acute Rehab PT Goals Pt will go Supine/Side to Sit: with supervision PT Goal: Supine/Side to Sit - Progress: Progressing toward goal Pt will go Sit to Stand: with supervision PT Goal: Sit to Stand - Progress: Progressing toward goal Pt will Ambulate: 51 - 150 feet;with supervision;with rolling walker PT Goal: Ambulate - Progress: Progressing toward goal Pt will Perform Home Exercise Program: with supervision, verbal cues required/provided PT Goal: Perform Home Exercise Program - Progress: Progressing toward goal  Visit Information  Last PT Received On: 10/31/12 Assistance Needed: +1    Subjective Data  Subjective: Its stiff Patient Stated Goal: Better pain control. Regain independence   Cognition  Cognition Overall Cognitive Status: Appears within functional limits for tasks assessed/performed Arousal/Alertness: Awake/alert Orientation Level: Appears intact for tasks assessed Behavior During Session: [facility] for tasks performed    Balance     End of Session PT - End of Session Equipment Utilized During Treatment: Gait belt;Right knee immobilizer Activity Tolerance: Patient tolerated treatment well Patient left: in chair;with call bell/phone within reach;with family/visitor present CPM Right Knee CPM Right Knee: Off   GP     [name], MPT Pager: [phone]

## 2012-11-01 DIAGNOSIS — D62 Acute posthemorrhagic anemia: Secondary | ICD-10-CM

## 2012-11-01 DIAGNOSIS — E871 Hypo-osmolality and hyponatremia: Secondary | ICD-10-CM

## 2012-11-01 DIAGNOSIS — E876 Hypokalemia: Secondary | ICD-10-CM

## 2012-11-01 LAB — CBC
MCH: 31.1 pg (ref 26.0–34.0)
MCHC: 34.2 g/dL (ref 30.0–36.0)
MCV: 91.1 fL (ref 78.0–100.0)
Platelets: 162 10*3/uL (ref 150–400)
RDW: 14 % (ref 11.5–15.5)

## 2012-11-01 LAB — BASIC METABOLIC PANEL WITH GFR
BUN: 18 mg/dL (ref 6–23)
CO2: 31 meq/L (ref 19–32)
Calcium: 9.2 mg/dL (ref 8.4–10.5)
Chloride: 91 meq/L — ABNORMAL LOW (ref 96–112)
Creatinine, Ser: 0.59 mg/dL (ref 0.50–1.10)
GFR calc Af Amer: >90 mL/min
GFR calc non Af Amer: 86 mL/min — ABNORMAL LOW
Glucose, Bld: 100 mg/dL — ABNORMAL HIGH (ref 70–99)
Potassium: 3.6 meq/L (ref 3.5–5.1)
Sodium: 131 meq/L — ABNORMAL LOW (ref 135–145)

## 2012-11-01 LAB — BODY FLUID CULTURE: Culture: NO GROWTH

## 2012-11-01 MED ORDER — DSS 100 MG PO CAPS: 100.0000 mg | ORAL_CAPSULE | Freq: Two times a day (BID) | ORAL | Status: DC

## 2012-11-01 MED ORDER — POLYETHYLENE GLYCOL 3350 17 G PO PACK: 17.0000 g | PACK | Freq: Every day | ORAL | Status: DC | PRN

## 2012-11-01 MED ORDER — DIPHENHYDRAMINE HCL 12.5 MG/5ML PO ELIX: 12.5000 mg | ORAL_SOLUTION | ORAL | Status: DC | PRN

## 2012-11-01 MED ORDER — HYDROMORPHONE HCL 2 MG PO TABS: 2.0000 mg | ORAL_TABLET | ORAL | Status: DC | PRN

## 2012-11-01 MED ORDER — METHOCARBAMOL 500 MG PO TABS: 500.0000 mg | ORAL_TABLET | Freq: Four times a day (QID) | ORAL | Status: DC | PRN

## 2012-11-01 MED ORDER — BISACODYL 10 MG RE SUPP: 10.0000 mg | Freq: Every day | RECTAL | Status: DC | PRN

## 2012-11-01 MED ORDER — TRAMADOL HCL 50 MG PO TABS: 50.0000 mg | ORAL_TABLET | Freq: Four times a day (QID) | ORAL | Status: DC | PRN

## 2012-11-01 MED ORDER — ONDANSETRON HCL 4 MG PO TABS: 4.0000 mg | ORAL_TABLET | Freq: Four times a day (QID) | ORAL | Status: DC | PRN

## 2012-11-01 MED ORDER — BISACODYL 10 MG RE SUPP
10.0000 mg | Freq: Once | RECTAL | Status: AC
  Administered 2012-11-01: 10 mg via RECTAL
  Filled 2012-11-01: qty 1

## 2012-11-01 MED ORDER — RIVAROXABAN 10 MG PO TABS: 10.0000 mg | ORAL_TABLET | Freq: Every day | ORAL | Status: DC

## 2012-11-01 NOTE — Progress Notes (Signed)
Clinical Social Work Department CLINICAL SOCIAL WORK PLACEMENT NOTE 11/01/2012  Patient:  [name], [name]  Account Number:  [id number] Admit date:  10/29/2012  Clinical Social Worker:  Cori Razor, LCSW  Date/time:  10/30/2012 10:18 AM  Clinical Social Work is seeking post-discharge placement for this patient at the following level of care:   SKILLED NURSING   (*CSW will update this form in Epic as items are completed)     Patient/family provided with [facility] Department of Clinical Social Work's list of facilities offering this level of care within the geographic area requested by the patient (or if unable, by the patient's family).  10/30/2012  Patient/family informed of their freedom to choose among providers that offer the needed level of care, that participate in Medicare, Medicaid or managed care program needed by the patient, have an available bed and are willing to accept the patient.  10/30/2012  Patient/family informed of [facility]' ownership interest in [facility], as well as of the fact that they are under no obligation to receive care at this facility.  PASARR submitted to [employer] on 10/30/2012 PASARR number received from [employer] on   FL2 transmitted to all facilities in geographic area requested by pt/family on  10/30/2012 FL2 transmitted to all facilities within larger geographic area on   Patient informed that his/her managed care company has contracts with or will negotiate with  certain facilities, including the following:     Patient/family informed of bed offers received:  10/30/2012 Patient [facility] bed at [facility] Physician recommends and patient chooses bed at    Patient to be transferred to [facility] on  11/01/2012 Patient to be transferred to facility by [facility]  The following physician request were entered in Epic:   Additional Comments: [employer] provided authorization for SNF and AMB transport.  Cori Razor LCSW [phone]

## 2012-11-01 NOTE — Discharge Summary (Signed)
Physician Discharge Summary   Patient ID: [name] MRN: [id number] DOB/AGE: November 15, 1931 79 y.o.  Admit date: 10/29/2012 Discharge date: 11/01/2012  Primary Diagnosis:  Osteoarthritis Right knee  Admission Diagnoses:  Past Medical History  Diagnosis Date  . Hypertension   . High cholesterol   . Complication of anesthesia 04/18/2012    "didn't tolerate it today very well; had the shakes and very hard time w/it"  . Arthritis     "in my back"  . Osteoporosis   . Pneumonia     hx of pneumonia- 1971  . Peripheral vascular disease     hx of ligation   . Breast cancer     left breast cancer    Discharge Diagnoses:   Principal Problem:   OA (osteoarthritis) of knee Active Problems:   Postoperative anemia due to acute blood loss   Hyponatremia   Hypokalemia  Estimated body mass index is 24.43 kg/(m^2) as calculated from the following:   Height as of this encounter: 5\' 7"  (1.702 m).   Weight as of this encounter: 70.761 kg (156 lb).  Procedure:  Procedure(s) (LRB): RIGHT TOTAL KNEE ARTHROPLASTY (Right)   Consults: None  HPI: [name] is a 79 y.o. year old female with end stage OA of her right knee with progressively worsening pain and dysfunction. She has constant pain, with activity and at rest and significant functional deficits with difficulties even with ADLs. She has had extensive non-op management including analgesics, injections of cortisone and home exercise program, but remains in significant pain with significant dysfunction. She had a septic knee post-arthroscopy, treated with I & D and IV antibiotics and pre-op labs and aspiration show no residual infection.Radiographs show bone on bone arthritis all 3 compartments. She presents now for right Total Knee Arthroplasty.   Laboratory Data: Admission on 10/29/2012  Component Date Value Range Status  . Specimen Description 10/29/2012 SYNOVIAL RIGHT KNEE   Final  . Special Requests 10/29/2012 NONE   Final  . Gram  Stain 10/29/2012    Final                   Value:RARE WBC SEEN                         NO ORGANISMS SEEN                         Gram Stain Report Called to,Read Back By and Verified With: DR. Lequita Halt AT 4132 ON 04.07.14 BY SHUEA.  . Report Status 10/29/2012 10/29/2012 FINAL   Final  . Specimen Description 10/29/2012 SYNOVIAL RIGHT KNEE   Final  . Special Requests 10/29/2012 NONE   Final  . Gram Stain 10/29/2012    Final                   Value:RARE WBC PRESENT,BOTH PMN AND MONONUCLEAR                         NO ORGANISMS SEEN                         Performed by [facility] Gram Stain Report Called to,Read Back By and Verified With: Gram Stain Report Called to,Read Back By and Verified With: DR ALUISIO AT 4401 10/29/12 BY SHUEA  . Culture 10/29/2012 NO GROWTH 2 DAYS   Final  .  Report Status 10/29/2012 PENDING   Incomplete  . Specimen Description 10/29/2012 SYNOVIAL RIGHT KNEE   Final  . Special Requests 10/29/2012 NONE   Final  . Gram Stain 10/29/2012    Final                   Value:RARE WBC PRESENT,BOTH PMN AND MONONUCLEAR                         NO ORGANISMS SEEN  . Culture 10/29/2012 NO ANAEROBES ISOLATED; CULTURE IN PROGRESS FOR 5 DAYS   Final  . Report Status 10/29/2012 PENDING   Incomplete  . WBC 10/30/2012 6.1  4.0 - 10.5 K/uL Final  . RBC 10/30/2012 2.96* 3.87 - 5.11 MIL/uL Final  . Hemoglobin 10/30/2012 9.3* 12.0 - 15.0 g/dL Final  . HCT 45/40/9811 27.2* 36.0 - 46.0 % Final  . MCV 10/30/2012 91.9  78.0 - 100.0 fL Final  . MCH 10/30/2012 31.4  26.0 - 34.0 pg Final  . MCHC 10/30/2012 34.2  30.0 - 36.0 g/dL Final  . RDW 91/47/8295 14.1  11.5 - 15.5 % Final  . Platelets 10/30/2012 135* 150 - 400 K/uL Final  . Sodium 10/30/2012 133* 135 - 145 mEq/L Final  . Potassium 10/30/2012 2.8* 3.5 - 5.1 mEq/L Final  . Chloride 10/30/2012 96  96 - 112 mEq/L Final  . CO2 10/30/2012 34* 19 - 32 mEq/L Final  . Glucose, Bld 10/30/2012 131* 70 - 99 mg/dL Final  . BUN 62/13/0865 15  6 -  23 mg/dL Final  . Creatinine, Ser 10/30/2012 0.68  0.50 - 1.10 mg/dL Final  . Calcium 78/46/9629 8.4  8.4 - 10.5 mg/dL Final  . GFR calc non Af Amer 10/30/2012 82* >90 mL/min Final  . GFR calc Af Amer 10/30/2012 >90  >90 mL/min Final   Comment:                                 The eGFR has been calculated                          using the CKD EPI equation.                          This calculation has not been                          validated in all clinical                          situations.                          eGFR's persistently                          <90 mL/min signify                          possible Chronic Kidney Disease.  . WBC 10/31/2012 4.7  4.0 - 10.5 K/uL Final  . RBC 10/31/2012 2.68* 3.87 - 5.11 MIL/uL Final  . Hemoglobin 10/31/2012 8.3* 12.0 - 15.0 g/dL Final  . HCT 52/84/1324 24.6* 36.0 - 46.0 % Final  .  MCV 10/31/2012 91.8  78.0 - 100.0 fL Final  . MCH 10/31/2012 31.0  26.0 - 34.0 pg Final  . MCHC 10/31/2012 33.7  30.0 - 36.0 g/dL Final  . RDW 16/04/9603 13.9  11.5 - 15.5 % Final  . Platelets 10/31/2012 126* 150 - 400 K/uL Final  . Sodium 10/31/2012 134* 135 - 145 mEq/L Final  . Potassium 10/31/2012 3.6  3.5 - 5.1 mEq/L Final   Comment: NO VISIBLE HEMOLYSIS                          DELTA CHECK NOTED  . Chloride 10/31/2012 97  96 - 112 mEq/L Final  . CO2 10/31/2012 31  19 - 32 mEq/L Final  . Glucose, Bld 10/31/2012 103* 70 - 99 mg/dL Final  . BUN 54/03/8118 15  6 - 23 mg/dL Final  . Creatinine, Ser 10/31/2012 0.60  0.50 - 1.10 mg/dL Final  . Calcium 14/78/2956 8.5  8.4 - 10.5 mg/dL Final  . GFR calc non Af Amer 10/31/2012 85* >90 mL/min Final  . GFR calc Af Amer 10/31/2012 >90  >90 mL/min Final   Comment:                                 The eGFR has been calculated                          using the CKD EPI equation.                          This calculation has not been                          validated in all clinical                           situations.                          eGFR's persistently                          <90 mL/min signify                          possible Chronic Kidney Disease.  . WBC 11/01/2012 5.1  4.0 - 10.5 K/uL Final  . RBC 11/01/2012 3.02* 3.87 - 5.11 MIL/uL Final  . Hemoglobin 11/01/2012 9.4* 12.0 - 15.0 g/dL Final  . HCT 21/30/8657 27.5* 36.0 - 46.0 % Final  . MCV 11/01/2012 91.1  78.0 - 100.0 fL Final  . MCH 11/01/2012 31.1  26.0 - 34.0 pg Final  . MCHC 11/01/2012 34.2  30.0 - 36.0 g/dL Final  . RDW 84/69/6295 14.0  11.5 - 15.5 % Final  . Platelets 11/01/2012 162  150 - 400 K/uL Final  . Sodium 11/01/2012 131* 135 - 145 mEq/L Final  . Potassium 11/01/2012 3.6  3.5 - 5.1 mEq/L Final  . Chloride 11/01/2012 91* 96 - 112 mEq/L Final  . CO2 11/01/2012 31  19 - 32 mEq/L Final  . Glucose, Bld 11/01/2012 100* 70 - 99 mg/dL Final  . BUN 28/41/3244 18  6 - 23 mg/dL Final  .  Creatinine, Ser 11/01/2012 0.59  0.50 - 1.10 mg/dL Final  . Calcium 40/98/1191 9.2  8.4 - 10.5 mg/dL Final  . GFR calc non Af Amer 11/01/2012 86* >90 mL/min Final  . GFR calc Af Amer 11/01/2012 >90  >90 mL/min Final   Comment:                                 The eGFR has been calculated                          using the CKD EPI equation.                          This calculation has not been                          validated in all clinical                          situations.                          eGFR's persistently                          <90 mL/min signify                          possible Chronic Kidney Disease.  Hospital Outpatient Visit on 10/22/2012  Component Date Value Range Status  . aPTT 10/22/2012 30  24 - 37 seconds Final  . WBC 10/22/2012 7.2  4.0 - 10.5 K/uL Final  . RBC 10/22/2012 4.28  3.87 - 5.11 MIL/uL Final  . Hemoglobin 10/22/2012 13.1  12.0 - 15.0 g/dL Final  . HCT 47/82/9562 39.3  36.0 - 46.0 % Final  . MCV 10/22/2012 91.8  78.0 - 100.0 fL Final  . MCH 10/22/2012 30.6  26.0 - 34.0 pg Final  .  MCHC 10/22/2012 33.3  30.0 - 36.0 g/dL Final  . RDW 13/02/6577 13.6  11.5 - 15.5 % Final  . Platelets 10/22/2012 203  150 - 400 K/uL Final  . Sodium 10/22/2012 136  135 - 145 mEq/L Final  . Potassium 10/22/2012 3.8  3.5 - 5.1 mEq/L Final  . Chloride 10/22/2012 96  96 - 112 mEq/L Final  . CO2 10/22/2012 30  19 - 32 mEq/L Final  . Glucose, Bld 10/22/2012 87  70 - 99 mg/dL Final  . BUN 46/96/2952 27* 6 - 23 mg/dL Final  . Creatinine, Ser 10/22/2012 0.77  0.50 - 1.10 mg/dL Final  . Calcium 84/13/2440 10.5  8.4 - 10.5 mg/dL Final  . Total Protein 10/22/2012 7.4  6.0 - 8.3 g/dL Final  . Albumin 05/21/2535 4.0  3.5 - 5.2 g/dL Final  . AST 64/40/3474 24  0 - 37 U/L Final  . ALT 10/22/2012 21  0 - 35 U/L Final  . Alkaline Phosphatase 10/22/2012 78  39 - 117 U/L Final  . Total Bilirubin 10/22/2012 0.4  0.3 - 1.2 mg/dL Final  . GFR calc non Af Amer 10/22/2012 78* >90 mL/min Final  . GFR calc Af Amer 10/22/2012 >90  >90 mL/min Final   Comment:  The eGFR has been calculated                          using the CKD EPI equation.                          This calculation has not been                          validated in all clinical                          situations.                          eGFR's persistently                          <90 mL/min signify                          possible Chronic Kidney Disease.  Marland Kitchen Prothrombin Time 10/22/2012 12.5  11.6 - 15.2 seconds Final  . INR 10/22/2012 0.94  0.00 - 1.49 Final  . ABO/RH(D) 10/22/2012 A POS   Final  . Antibody Screen 10/22/2012 NEG   Final  . Sample Expiration 10/22/2012 11/01/2012   Final  . Color, Urine 10/22/2012 YELLOW  YELLOW Final  . APPearance 10/22/2012 CLEAR  CLEAR Final  . Specific Gravity, Urine 10/22/2012 1.021  1.005 - 1.030 Final  . pH 10/22/2012 7.5  5.0 - 8.0 Final  . Glucose, UA 10/22/2012 NEGATIVE  NEGATIVE mg/dL Final  . Hgb urine dipstick 10/22/2012 NEGATIVE  NEGATIVE Final  . Bilirubin  Urine 10/22/2012 NEGATIVE  NEGATIVE Final  . Ketones, ur 10/22/2012 NEGATIVE  NEGATIVE mg/dL Final  . Protein, ur 16/04/9603 NEGATIVE  NEGATIVE mg/dL Final  . Urobilinogen, UA 10/22/2012 0.2  0.0 - 1.0 mg/dL Final  . Nitrite 54/03/8118 NEGATIVE  NEGATIVE Final  . Leukocytes, UA 10/22/2012 MODERATE* NEGATIVE Final  . MRSA, PCR 10/22/2012 NEGATIVE  NEGATIVE Final  . Staphylococcus aureus 10/22/2012 NEGATIVE  NEGATIVE Final   Comment:                                 The Xpert SA Assay (FDA                          approved for NASAL specimens                          in patients over 78 years of age),                          is one component of                          a comprehensive surveillance                          program.  Test performance has  been validated by [facility] for patients greater                          than or equal to 87 year old.                          It is not intended                          to diagnose infection nor to                          guide or monitor treatment.  . Squamous Epithelial / LPF 10/22/2012 RARE  RARE Final  . WBC, UA 10/22/2012 3-6  <3 WBC/hpf Final  . RBC / HPF 10/22/2012 0-2  <3 RBC/hpf Final  . Bacteria, UA 10/22/2012 FEW* RARE Final  . Specimen Description 10/22/2012 URINE, CLEAN CATCH   Final  . Special Requests 10/22/2012 NONE   Final  . Culture  Setup Time 10/22/2012 10/22/2012 14:31   Final  . Colony Count 10/22/2012 NO GROWTH   Final  . Culture 10/22/2012 NO GROWTH   Final  . Report Status 10/22/2012 10/23/2012 FINAL   Final  . ABO/RH(D) 10/22/2012 A POS   Final  Office Visit on 09/17/2012  Component Date Value Range Status  . Sed Rate 09/17/2012 4  0 - 22 mm/hr Final  . CRP 09/17/2012 <0.5  <0.60 mg/dL Final     X-Rays:Dg Chest 2 View  10/22/2012  *RADIOLOGY REPORT*  Clinical Data: Preop for total knee replacement surgery.  CHEST - 2 VIEW  Comparison: 04/17/2012.   Findings: The cardiac silhouette, mediastinal and hilar contours are within normal limits and stable.  Asymmetric right upper lobe density needs further evaluation.  Recommend chest CT (with contrast if possible) for further evaluation.  The left lung is clear.  No pleural effusions.  Stable surgical changes in the left axilla.  The bony thorax is intact.  Stable T8 compression deformity.  IMPRESSION:  1.  Asymmetric density in the right upper lobe needs further evaluation.  Recommend chest CT (with contrast if possible). 2.  No acute pulmonary findings.   Original Report Authenticated By: Rudie Meyer, M.D.    Ct Chest W Contrast  10/25/2012  *RADIOLOGY REPORT*  Clinical Data: Right upper lobe opacity on preoperative chest x- ray, history of left breast carcinoma in 1996 with lumpectomy and radiation therapy  CT CHEST WITH CONTRAST  Technique:  Multidetector CT imaging of the chest was performed following the standard protocol during bolus administration of intravenous contrast.  Contrast: 75mL OMNIPAQUE IOHEXOL 300 MG/ML  SOLN  Comparison: Chest x-ray of 10/22/2012 and 04/17/2012  Findings: On the lung window images there is mild biapical pleuroparenchymal scarring right slightly greater than left. However no right upper lung lesion is seen to correspond to the opacity questioned on recent chest x-ray.  The only possible explanation is a slightly more sclerotic appearance of the right anterior first rib.  No focal infiltrate is noted and there is no evidence of pleural effusion.  An old healed posterior left tenth rib fracture is noted.  On soft tissue window images, the thyroid gland is normal in size. There is  an 11 mm low attenuation nodule within the left lobe of thyroid, of questionable significance in this age group.  If clinically warranted ultrasound of the thyroid could be performed to assess further.  The thoracic aorta opacifies with no acute abnormality and the origins of the great vessels are  patent.  The pulmonary arteries are not as well opacified but no significant abnormality is seen.  No mediastinal or hilar adenopathy is noted. The upper abdomen that is visualized is unremarkable. Vertebroplasties are noted at the L1 and L2 levels, with mild compression deformity of T8 vertebral body.  Slight thoracic kyphosis is present.  IMPRESSION:  1.  No lung lesion is seen to correspond to the opacity noted on recent chest x-ray within the right upper lung field.  This area may have represented sclerosis involving the anterior right first rib. 2.  11 mm low attenuation nodule in the left lobe of thyroid, of questionable significance.  Consider ultrasound of the thyroid if warranted clinically. 3.  No mediastinal or hilar adenopathy. 4.  Vertebroplasties at L1 and L2 with mild compression of T8. Thoracic kyphosis.   Original Report Authenticated By: Dwyane Dee, M.D.     EKG: Orders placed during the hospital encounter of 04/17/12  . EKG 12-LEAD  . EKG 12-LEAD  . EKG 12-LEAD  . EKG     Hospital Course: [name] is a 79 y.o. who was admitted to [facility]. They were brought to the operating room on 10/29/2012 and underwent Procedure(s): RIGHT TOTAL KNEE ARTHROPLASTY.  Patient tolerated the procedure well and was later transferred to the recovery room and then to the orthopaedic floor for postoperative care.  They were given PO and IV analgesics for pain control following their surgery.  They were given 24 hours of postoperative antibiotics of  Anti-infectives   Start     Dose/Rate Route Frequency Ordered Stop   10/29/12 1330  ceFAZolin (ANCEF) IVPB 1 g/50 mL premix     1 g 100 mL/hr over 30 Minutes Intravenous Every 6 hours 10/29/12 1122 10/29/12 2010   10/29/12 0516  ceFAZolin (ANCEF) IVPB 2 g/50 mL premix    Comments:  Decreased to Ancef 2 Gm based on weight < 120 kg per protocol (weight documented to be 70 kg)   2 g 100 mL/hr over 30 Minutes Intravenous 60 min pre-op 10/29/12  0516 10/29/12 0704   10/29/12 0516  ceFAZolin (ANCEF) 3 g in dextrose 5 % 50 mL IVPB  Status:  Discontinued     3 g 160 mL/hr over 30 Minutes Intravenous On call to O.R. 10/29/12 1610 10/29/12 0518     and started on DVT prophylaxis in the form of Xarelto.   PT and OT were ordered for total joint protocol.  Discharge planning consulted to help with postop disposition and equipment needs.  She wanted to look into a SNF.  Patient had a tough night on the evening of surgery with some pain and also had an elevated temp.  They started to get up OOB with therapy on day one and her temp was back down. Hemovac drain was pulled without difficulty.  Continued to work with therapy into day two.  Dressing was changed on day two and the incision was healing well.  By day three, the patient had progressed with therapy and meeting their goals.  Incision was healing well.  Patient was seen in rounds and was ready to go to [facility].   Discharge Medications: Prior to  Admission medications   Medication Sig Start Date End Date Taking? Authorizing Provider  acetaminophen (TYLENOL) 500 MG tablet Take 1,000 mg by mouth every 6 (six) hours as needed. For pain.   Yes Historical Provider, MD  escitalopram (LEXAPRO) 10 MG tablet Take 10 mg by mouth every morning.    Yes Historical Provider, MD  simvastatin (ZOCOR) 20 MG tablet Take 20 mg by mouth every morning.    Yes Historical Provider, MD  triamterene-hydrochlorothiazide (MAXZIDE) 75-50 MG per tablet Take 1 tablet by mouth daily before breakfast.    Yes Historical Provider, MD  bisacodyl (DULCOLAX) 10 MG suppository Place 1 suppository (10 mg total) rectally daily as needed. 11/01/12   Alexzandrew Perkins, PA-C  celecoxib (CELEBREX) 200 MG capsule Take 200 mg by mouth daily.     Historical Provider, MD  diphenhydrAMINE (BENADRYL) 12.5 MG/5ML elixir Take 5-10 mLs (12.5-25 mg total) by mouth every 4 (four) hours as needed for itching. 11/01/12   Alexzandrew Perkins, PA-C    docusate sodium 100 MG CAPS Take 100 mg by mouth 2 (two) times daily. 11/01/12   Alexzandrew Perkins, PA-C  HYDROmorphone (DILAUDID) 2 MG tablet Take 1-2 tablets (2-4 mg total) by mouth every 4 (four) hours as needed. 11/01/12   Alexzandrew Perkins, PA-C  methocarbamol (ROBAXIN) 500 MG tablet Take 1 tablet (500 mg total) by mouth every 6 (six) hours as needed. 11/01/12   Alexzandrew Perkins, PA-C  ondansetron (ZOFRAN) 4 MG tablet Take 1 tablet (4 mg total) by mouth every 6 (six) hours as needed for nausea. 11/01/12   Alexzandrew Perkins, PA-C  polyethylene glycol (MIRALAX / GLYCOLAX) packet Take 17 g by mouth daily as needed. 11/01/12   Alexzandrew Julien Girt, PA-C  rivaroxaban (XARELTO) 10 MG TABS tablet Take 1 tablet (10 mg total) by mouth daily with breakfast. 11/01/12   Alexzandrew Julien Girt, PA-C  traMADol (ULTRAM) 50 MG tablet Take 1-2 tablets (50-100 mg total) by mouth every 6 (six) hours as needed. 11/01/12   Alexzandrew Julien Girt, PA-C    Diet: Cardiac diet Activity:WBAT Follow-up:in 2 weeks Disposition - Skilled nursing facility - [facility] Discharged Condition: good   Discharge Orders   Future Orders Complete By Expires     Call MD / Call 911  As directed     Comments:      If you experience chest pain or shortness of breath, CALL 911 and be transported to the hospital emergency room.  If you develope a fever above 101 F, pus (white drainage) or increased drainage or redness at the wound, or calf pain, call your surgeon's office.    Change dressing  As directed     Comments:      Change dressing daily with sterile 4 x 4 inch gauze dressing and apply TED hose. Do not submerge the incision under water.    Constipation Prevention  As directed     Comments:      Drink plenty of fluids.  Prune juice may be helpful.  You may use a stool softener, such as Colace (over the counter) 100 mg twice a day.  Use MiraLax (over the counter) for constipation as needed.    Diet - low sodium heart healthy   As directed     Discharge instructions  As directed     Comments:      Pick up stool softner and laxative for home. Do not submerge incision under water. May shower. Continue to use ice for pain and swelling from surgery.  Take Xarelto  for two and a half more weeks, then discontinue Xarelto.  When discharged from the skilled rehab facility, please have the facility set up the patient's Home Health Physical Therapy prior to being released.  Also provide the patient with their medications at time of release from the facility to include their pain medication, the muscle relaxants, and their blood thinner medication.  If the patient is still at the rehab facility at time of follow up appointment, please also assist the patient in arranging follow up appointment in our office and any transportation needs.    Do not put a pillow under the knee. Place it under the heel.  As directed     Do not sit on low chairs, stoools or toilet seats, as it may be difficult to get up from low surfaces  As directed     Driving restrictions  As directed     Comments:      No driving until released by the physician.    Increase activity slowly as tolerated  As directed     Lifting restrictions  As directed     Comments:      No lifting until released by the physician.    Patient may shower  As directed     Comments:      You may shower without a dressing once there is no drainage.  Do not wash over the wound.  If drainage remains, do not shower until drainage stops.    TED hose  As directed     Comments:      Use stockings (TED hose) for 3 weeks on both leg(s).  You may remove them at night for sleeping.    Weight bearing as tolerated  As directed         Medication List    STOP taking these medications       FORTEO 600 MCG/2.4ML Soln  Generic drug:  Teriparatide (Recombinant)     multivitamin with minerals Tabs     Vitamin D-3 5000 UNITS Tabs      TAKE these medications       acetaminophen 500 MG  tablet  Commonly known as:  TYLENOL  Take 1,000 mg by mouth every 6 (six) hours as needed. For pain.     bisacodyl 10 MG suppository  Commonly known as:  DULCOLAX  Place 1 suppository (10 mg total) rectally daily as needed.     celecoxib 200 MG capsule  Commonly known as:  CELEBREX  Take 200 mg by mouth daily.     diphenhydrAMINE 12.5 MG/5ML elixir  Commonly known as:  BENADRYL  Take 5-10 mLs (12.5-25 mg total) by mouth every 4 (four) hours as needed for itching.     DSS 100 MG Caps  Take 100 mg by mouth 2 (two) times daily.     escitalopram 10 MG tablet  Commonly known as:  LEXAPRO  Take 10 mg by mouth every morning.     HYDROmorphone 2 MG tablet  Commonly known as:  DILAUDID  Take 1-2 tablets (2-4 mg total) by mouth every 4 (four) hours as needed.     methocarbamol 500 MG tablet  Commonly known as:  ROBAXIN  Take 1 tablet (500 mg total) by mouth every 6 (six) hours as needed.     ondansetron 4 MG tablet  Commonly known as:  ZOFRAN  Take 1 tablet (4 mg total) by mouth every 6 (six) hours as needed for nausea.     polyethylene glycol  packet  Commonly known as:  MIRALAX / GLYCOLAX  Take 17 g by mouth daily as needed.     rivaroxaban 10 MG Tabs tablet  Commonly known as:  XARELTO  Take 1 tablet (10 mg total) by mouth daily with breakfast.     simvastatin 20 MG tablet  Commonly known as:  ZOCOR  Take 20 mg by mouth every morning.     traMADol 50 MG tablet  Commonly known as:  ULTRAM  Take 1-2 tablets (50-100 mg total) by mouth every 6 (six) hours as needed.     triamterene-hydrochlorothiazide 75-50 MG per tablet  Commonly known as:  MAXZIDE  Take 1 tablet by mouth daily before breakfast.           Follow-up Information   Follow up with Loanne Drilling, MD. Schedule an appointment as soon as possible for a visit in 2 weeks.   Contact information:   [address], [address] [place] [address] [phone]        Signed: Patrica Duel 11/01/2012, 8:28 AM

## 2012-11-01 NOTE — Progress Notes (Signed)
   Subjective: 3 Days Post-Op Procedure(s) (LRB): RIGHT TOTAL KNEE ARTHROPLASTY (Right) Patient reports pain as mild.   Patient seen in rounds with Dr. Lequita Halt. Patient is well, and has had no acute complaints or problems Patient is ready to go to [facility] today.  Objective: Vital signs in last 24 hours: Temp:  [97.5 F (36.4 C)-99.1 F (37.3 C)] 99.1 F (37.3 C) (04/10 0523) Pulse Rate:  [72-77] 77 (04/10 0523) Resp:  [14-20] 20 (04/10 0523) BP: (105-117)/(61-67) 117/67 mmHg (04/10 0523) SpO2:  [91 %-95 %] 95 % (04/10 0523)  Intake/Output from previous day:  Intake/Output Summary (Last 24 hours) at 11/01/12 0700 Last data filed at 11/01/12 0523  Gross per 24 hour  Intake 676.67 ml  Output    500 ml  Net 176.67 ml    Intake/Output this shift: Total I/O In: 240 [P.O.:240] Out: 200 [Urine:200]  Labs:  Recent Labs  10/30/12 0443 10/31/12 0421 11/01/12 0502  HGB 9.3* 8.3* 9.4*    Recent Labs  10/31/12 0421 11/01/12 0502  WBC 4.7 5.1  RBC 2.68* 3.02*  HCT 24.6* 27.5*  PLT 126* 162    Recent Labs  10/31/12 0421 11/01/12 0502  NA 134* 131*  K 3.6 3.6  CL 97 91*  CO2 31 31  BUN 15 18  CREATININE 0.60 0.59  GLUCOSE 103* 100*  CALCIUM 8.5 9.2   No results found for this basename: LABPT, INR,  in the last 72 hours  EXAM: General - Patient is Alert, Appropriate and Oriented Extremity - Neurovascular intact Sensation intact distally Dorsiflexion/Plantar flexion intact No cellulitis present Incision - clean, dry, no drainage, healing Motor Function - intact, moving foot and toes well on exam.   Assessment/Plan: 3 Days Post-Op Procedure(s) (LRB): RIGHT TOTAL KNEE ARTHROPLASTY (Right) Procedure(s) (LRB): RIGHT TOTAL KNEE ARTHROPLASTY (Right) Past Medical History  Diagnosis Date  . Hypertension   . High cholesterol   . Complication of anesthesia 04/18/2012    "didn't tolerate it today very well; had the shakes and very hard time w/it"  .  Arthritis     "in my back"  . Osteoporosis   . Pneumonia     hx of pneumonia- 1971  . Peripheral vascular disease     hx of ligation   . Breast cancer     left breast cancer    Principal Problem:   OA (osteoarthritis) of knee  Estimated body mass index is 24.43 kg/(m^2) as calculated from the following:   Height as of this encounter: 5\' 7"  (1.702 m).   Weight as of this encounter: 70.761 kg (156 lb). Discharge to SNF Diet - Cardiac diet Follow up - in 2 weeks Activity - WBAT Disposition - Skilled nursing facility Condition Upon Discharge - Good D/C Meds - See DC Summary DVT Prophylaxis - Xarelto  Intraop cultures no growth.  Lexington Devine 11/01/2012, 7:00 AM

## 2012-11-01 NOTE — Progress Notes (Signed)
Physical Therapy Treatment Patient Details Name: [name] MRN: [id number] DOB: 1931-09-18 Today's Date: 11/01/2012 Time: 1308-6578 PT Time Calculation (min): 28 min  PT Assessment / Plan / Recommendation Comments on Treatment Session  Pt planning to d/c to SNF today.     Follow Up Recommendations  SNF     Does the patient have the potential to tolerate intense rehabilitation     Barriers to Discharge        Equipment Recommendations  None recommended by PT    Recommendations for Other Services OT consult  Frequency 7X/week   Plan Discharge plan remains appropriate    Precautions / Restrictions Precautions Precautions: Knee;Fall Required Braces or Orthoses: Knee Immobilizer - Right Knee Immobilizer - Right: Discontinue once straight leg raise with < 10 degree lag Restrictions Weight Bearing Restrictions: No RLE Weight Bearing: Weight bearing as tolerated   Pertinent Vitals/Pain "I hurt all over. My knee is okay."-unrated    Mobility  Bed Mobility Bed Mobility: Supine to Sit;Sit to Supine Supine to Sit: 4: Min assist Sit to Supine: 4: Min assist Details for Bed Mobility Assistance: Increased time. VCs safety, technique, hand placement. Assist for R LE off/onto bed.  Transfers Transfers: Sit to Stand;Stand to Sit Sit to Stand: 3: Mod assist;From bed;From elevated surface;From toilet Stand to Sit: 4: Min assist;To bed;To toilet Details for Transfer Assistance: VCs safety, technique, hand placement. Assist to rise, stabilize, control descent Ambulation/Gait Ambulation/Gait Assistance: 4: Min assist Ambulation Distance (Feet): 15 Feet (x2) Assistive device: Rolling walker Ambulation/Gait Assistance Details: Decreased ambulation distance this session-pt somewhat lethargic and fatigued easily. Ambulated to bathroom then back to bed.  Gait Pattern: Step-to pattern;Antalgic;Trunk flexed;Decreased stride length    Exercises Total Joint Exercises Ankle Circles/Pumps:  AROM;Both;10 reps;Supine Quad Sets: AROM;Right;10 reps;Supine Heel Slides: AAROM;Right;10 reps;Supine Hip ABduction/ADduction: AAROM;Right;10 reps;Supine Straight Leg Raises: AAROM;Right;10 reps;Supine   PT Diagnosis:    PT Problem List:   PT Treatment Interventions:     PT Goals Acute Rehab PT Goals Pt will go Supine/Side to Sit: with supervision PT Goal: Supine/Side to Sit - Progress: Progressing toward goal Pt will go Sit to Supine/Side: with supervision PT Goal: Sit to Supine/Side - Progress: Progressing toward goal Pt will go Sit to Stand: with supervision PT Goal: Sit to Stand - Progress: Progressing toward goal Pt will Ambulate: 51 - 150 feet;with supervision;with rolling walker PT Goal: Ambulate - Progress: Progressing toward goal Pt will Perform Home Exercise Program: with supervision, verbal cues required/provided PT Goal: Perform Home Exercise Program - Progress: Progressing toward goal  Visit Information  Last PT Received On: 11/01/12 Assistance Needed: +1    Subjective Data  Subjective: im just sore all over Patient Stated Goal: Better pain control. Regain independence   Cognition  Cognition Overall Cognitive Status: Appears within functional limits for tasks assessed/performed Arousal/Alertness: Awake/alert Orientation Level: Appears intact for tasks assessed Behavior During Session: Lethargic    Balance     End of Session PT - End of Session Equipment Utilized During Treatment: Right knee immobilizer Activity Tolerance: Patient limited by fatigue;Patient limited by pain Patient left: in bed;with call bell/phone within reach CPM Right Knee CPM Right Knee: Off   GP     [name], MPT Pager: [phone]

## 2012-11-03 LAB — ANAEROBIC CULTURE

## 2012-11-14 ENCOUNTER — Non-Acute Institutional Stay (SKILLED_NURSING_FACILITY): Payer: Medicare Other | Admitting: Internal Medicine

## 2012-11-14 DIAGNOSIS — F329 Major depressive disorder, single episode, unspecified: Secondary | ICD-10-CM

## 2012-11-14 DIAGNOSIS — I10 Essential (primary) hypertension: Secondary | ICD-10-CM

## 2012-11-14 DIAGNOSIS — M1710 Unilateral primary osteoarthritis, unspecified knee: Secondary | ICD-10-CM

## 2012-11-14 DIAGNOSIS — D62 Acute posthemorrhagic anemia: Secondary | ICD-10-CM

## 2012-11-19 ENCOUNTER — Other Ambulatory Visit: Payer: Self-pay | Admitting: *Deleted

## 2012-11-19 ENCOUNTER — Non-Acute Institutional Stay (SKILLED_NURSING_FACILITY): Payer: Medicare Other | Admitting: Adult Health

## 2012-11-19 DIAGNOSIS — K5900 Constipation, unspecified: Secondary | ICD-10-CM

## 2012-11-19 DIAGNOSIS — F329 Major depressive disorder, single episode, unspecified: Secondary | ICD-10-CM

## 2012-11-19 DIAGNOSIS — E785 Hyperlipidemia, unspecified: Secondary | ICD-10-CM

## 2012-11-19 DIAGNOSIS — F32A Depression, unspecified: Secondary | ICD-10-CM

## 2012-11-19 DIAGNOSIS — IMO0002 Reserved for concepts with insufficient information to code with codable children: Secondary | ICD-10-CM

## 2012-11-19 DIAGNOSIS — M1710 Unilateral primary osteoarthritis, unspecified knee: Secondary | ICD-10-CM

## 2012-11-19 DIAGNOSIS — I10 Essential (primary) hypertension: Secondary | ICD-10-CM

## 2012-11-19 MED ORDER — HYDROMORPHONE HCL 2 MG PO TABS: ORAL_TABLET | ORAL | Status: DC

## 2012-11-20 ENCOUNTER — Encounter: Payer: Self-pay | Admitting: Adult Health

## 2012-11-20 DIAGNOSIS — F329 Major depressive disorder, single episode, unspecified: Secondary | ICD-10-CM | POA: Insufficient documentation

## 2012-11-20 DIAGNOSIS — I10 Essential (primary) hypertension: Secondary | ICD-10-CM | POA: Insufficient documentation

## 2012-11-20 DIAGNOSIS — F32A Depression, unspecified: Secondary | ICD-10-CM | POA: Insufficient documentation

## 2012-11-20 DIAGNOSIS — K5903 Drug induced constipation: Secondary | ICD-10-CM | POA: Insufficient documentation

## 2012-11-20 DIAGNOSIS — E7800 Pure hypercholesterolemia, unspecified: Secondary | ICD-10-CM | POA: Insufficient documentation

## 2012-11-20 DIAGNOSIS — K5900 Constipation, unspecified: Secondary | ICD-10-CM | POA: Insufficient documentation

## 2012-11-20 HISTORY — DX: Depression, unspecified: F32.A

## 2012-11-20 NOTE — Progress Notes (Signed)
  Subjective:    Patient ID: [name], female    DOB: 1932-07-16, 79 y.o.   MRN: [id number]  HPI This is a 79 year old female who is for discharge home and will have outpatient rehabilitation. She has been admitted to [facility] on 11/01/12 from [facility] with osteoarthritis S/P right total knee arthroplasty. She has been admitted for a short-term rehabilitation.   Review of Systems  Constitutional: Negative.   HENT: Negative.   Eyes: Negative.   Respiratory: Negative for choking, chest tightness and shortness of breath.   Cardiovascular: Negative for leg swelling.  Gastrointestinal: Negative.   Endocrine: Negative.   Genitourinary: Negative.   Neurological: Negative.   Hematological: Negative for adenopathy. Does not bruise/bleed easily.  Psychiatric/Behavioral: Negative.        Objective:   Physical Exam  Nursing note and vitals reviewed. Constitutional: She is oriented to person, place, and time. She appears well-developed and well-nourished.  HENT:  Head: Normocephalic.  Right Ear: External ear normal.  Left Ear: External ear normal.  Eyes: Conjunctivae are normal. Pupils are equal, round, and reactive to light.  Neck: Normal range of motion. Neck supple.  Cardiovascular: Normal rate, regular rhythm and normal heart sounds.   Pulmonary/Chest: Effort normal and breath sounds normal. No respiratory distress.  Abdominal: Soft. Bowel sounds are normal.  Musculoskeletal: Normal range of motion. She exhibits no edema and no tenderness.  Neurological: She is alert and oriented to person, place, and time.  Skin: Skin is warm and dry.  Psychiatric: She has a normal mood and affect. Her behavior is normal. Judgment and thought content normal.    Medications reviewed per [facility]      Assessment & Plan:  Depression - stable  Unspecified constipation - no complaints  Other and unspecified hyperlipidemia - stable  Essential hypertension, benign -  well-controlled  OA (osteoarthritis) of knee S/P Right Total Knee arthroplasty - will have outpatient rehabilitation

## 2012-11-29 NOTE — Progress Notes (Signed)
Patient ID: [name], female   DOB: [birth year], 79 y.o.   MRN: [id number]        HISTORY & PHYSICAL  DATE:  11/14/2012  FACILITY: [facility]   LEVEL OF CARE: SNF   ALLERGIES:  Allergies  Allergen Reactions  . Oysters (Shellfish Allergy) Nausea And Vomiting  . Codeine Nausea And Vomiting    CHIEF COMPLAINT:  Manage right knee osteoarthritis, acute blood loss anemia and hypertension.    HISTORY OF PRESENT ILLNESS:  The patient is a 79 year-old, Caucasian female.    KNEE OSTEOARTHRITIS: Patient had a history of pain and functional disability in the knee due to end-stage osteoarthritis and has failed nonsurgical conservative treatments. Patient had worsening of pain with activity and weight bearing, pain that interfered with activities of daily living & pain with passive range of motion. Therefore patient underwent total knee arthroplasty and tolerated the procedure well. Patient is admitted to this facility for sort short-term rehabilitation. Patient denies knee pain.   ANEMIA: Postoperatively, patient suffered acute blood loss.  The anemia has been stable. The patient denies fatigue, melena or hematochezia.  Patient is currently not on iron.  Last hemoglobins are 9.3, 8.3, 9.4, 13.1.     HTN: Pt 's HTN remains stable.  Denies CP, sob, DOE, pedal edema, headaches, dizziness or visual disturbances.  No complications from the medications currently being used.  Last BP : 98/62, 133/66, 111/64.  PAST MEDICAL HISTORY :  Past Medical History  Diagnosis Date  . Hypertension   . High cholesterol   . Complication of anesthesia 04/18/2012    "didn't tolerate it today very well; had the shakes and very hard time w/it"  . Arthritis     "in my back"  . Osteoporosis   . Pneumonia     hx of pneumonia- 1971  . Peripheral vascular disease     hx of ligation   . Breast cancer     left breast cancer   . Depression 11/20/2012    PAST SURGICAL HISTORY: Past Surgical History  Procedure  Laterality Date  . Knee arthroscopy  04/17/2012    Procedure: ARTHROSCOPY KNEE;  Surgeon: Raymon Mutton, MD;  Location: [facility] OR;  Service: Orthopedics;  Laterality: Right;  . I&d extremity  04/17/2012    Procedure: IRRIGATION AND DEBRIDEMENT EXTREMITY;  Surgeon: Raymon Mutton, MD;  Location: [facility] OR;  Service: Orthopedics;  Laterality: Right;  . Tonsillectomy and adenoidectomy      "I was a child"  . Posterior laminectomy / decompression lumbar spine  1980's  . Breast lumpectomy      left  . Breast biopsy      left  . Menisectomy  04/11/2012    right  . Rotator cuff surgery       right   . Vein ligation    . Total knee arthroplasty Right 10/29/2012    Procedure: RIGHT TOTAL KNEE ARTHROPLASTY;  Surgeon: Loanne Drilling, MD;  Location: [facility] ORS;  Service: Orthopedics;  Laterality: Right;    SOCIAL HISTORY:  reports that she has never smoked. She has never used smokeless tobacco. She reports that she does not drink alcohol or use illicit drugs.  FAMILY HISTORY:  Family History  Problem Relation Age of Onset  . Kidney failure Mother     CURRENT MEDICATIONS: Reviewed per [facility]  REVIEW OF SYSTEMS:  See HPI otherwise 14 point ROS is negative.  PHYSICAL EXAMINATION  VS:  T 97.8  P 73     RR 18      BP 98/62      POX 98% room air      WT (Lb)  GENERAL: no acute distress, normal body habitus SKIN: right knee incision clean and dry and closed, warm & dry, no suspicious lesions or rashes, no excessive dryness  EYES: conjunctivae normal, sclerae normal, normal eye lids MOUTH/THROAT: lips without lesions,no lesions in the mouth,tongue is without lesions,uvula elevates in midline NECK: supple, trachea midline, no neck masses, no thyroid tenderness, no thyromegaly LYMPHATICS: no LAN in the neck, no supraclavicular LAN RESPIRATORY: breathing is even & unlabored, BS CTAB CARDIAC: RRR, no murmur,no extra heart sounds EDEMA/VARICOSITIES:   +1 bilateral lower extremity edema  ARTERIAL:  pedal  pulses +1  GI:  ABDOMEN: abdomen soft, normal BS, no masses, no tenderness  LIVER/SPLEEN: no hepatomegaly, no splenomegaly MUSCULOSKELETAL: HEAD: normal to inspection & palpation BACK: no kyphosis, scoliosis or spinal processes tenderness EXTREMITIES: LEFT UPPER EXTREMITY: full range of motion, normal strength & tone RIGHT UPPER EXTREMITY:  full range of motion, normal strength & tone LEFT LOWER EXTREMITY:  full range of motion, normal strength & tone RIGHT LOWER EXTREMITY: strength intact, range of motion not tested due to surgery  PSYCHIATRIC: the patient is alert & oriented to person, affect & behavior appropriate  LABS/RADIOLOGY: Synovial fluid culture showed no growth.    Hemoglobin 9.3, MCV 91.9, platelets 135, white count 6.1.    Potassium 3.2, glucose 131, otherwise BMP normal.    PTT 30, PT 12.5, INR 0.94.    Total protein 7.4, otherwise liver profile normal.    Urinalysis negative.  MRSA by PCR negative.    Staph aureus by PCR negative.   Urine culture showed no growth.   Chest x-ray:  No acute disease.   Chest CT:  No acute findings.  ASSESSMENT/PLAN:  Right knee osteoarthritis.  Status post right total knee arthroplasty.  Continue rehabilitation.   Acute blood loss anemia.  Monitor hemoglobins.    Hypertension.  Well controlled.   Depression.  Well controlled.    Hyperlipidemia.  Continue Zocor.    Patient planned for discharge next week.  Therefore, follow up with primary MD.    I have reviewed patient's medical records received at admission/from hospitalization.  CPT CODE: 16109

## 2013-04-12 ENCOUNTER — Encounter (HOSPITAL_BASED_OUTPATIENT_CLINIC_OR_DEPARTMENT_OTHER): Payer: Self-pay

## 2013-04-12 ENCOUNTER — Emergency Department (HOSPITAL_BASED_OUTPATIENT_CLINIC_OR_DEPARTMENT_OTHER): Payer: Medicare Other

## 2013-04-12 ENCOUNTER — Emergency Department (HOSPITAL_BASED_OUTPATIENT_CLINIC_OR_DEPARTMENT_OTHER)
Admission: EM | Admit: 2013-04-12 | Discharge: 2013-04-12 | Disposition: A | Discharge status: Home / Self Care | Payer: Medicare Other | Attending: Emergency Medicine | Admitting: Emergency Medicine

## 2013-04-12 DIAGNOSIS — I10 Essential (primary) hypertension: Secondary | ICD-10-CM | POA: Insufficient documentation

## 2013-04-12 DIAGNOSIS — E7800 Pure hypercholesterolemia, unspecified: Secondary | ICD-10-CM | POA: Insufficient documentation

## 2013-04-12 DIAGNOSIS — S20219A Contusion of unspecified front wall of thorax, initial encounter: Secondary | ICD-10-CM | POA: Insufficient documentation

## 2013-04-12 DIAGNOSIS — Y9389 Activity, other specified: Secondary | ICD-10-CM | POA: Insufficient documentation

## 2013-04-12 DIAGNOSIS — S0990XA Unspecified injury of head, initial encounter: Secondary | ICD-10-CM

## 2013-04-12 DIAGNOSIS — Z791 Long term (current) use of non-steroidal anti-inflammatories (NSAID): Secondary | ICD-10-CM | POA: Insufficient documentation

## 2013-04-12 DIAGNOSIS — Y929 Unspecified place or not applicable: Secondary | ICD-10-CM | POA: Insufficient documentation

## 2013-04-12 DIAGNOSIS — F329 Major depressive disorder, single episode, unspecified: Secondary | ICD-10-CM | POA: Insufficient documentation

## 2013-04-12 DIAGNOSIS — F3289 Other specified depressive episodes: Secondary | ICD-10-CM | POA: Insufficient documentation

## 2013-04-12 DIAGNOSIS — Z8701 Personal history of pneumonia (recurrent): Secondary | ICD-10-CM | POA: Insufficient documentation

## 2013-04-12 DIAGNOSIS — Z79899 Other long term (current) drug therapy: Secondary | ICD-10-CM | POA: Insufficient documentation

## 2013-04-12 DIAGNOSIS — Z853 Personal history of malignant neoplasm of breast: Secondary | ICD-10-CM | POA: Insufficient documentation

## 2013-04-12 DIAGNOSIS — S0100XA Unspecified open wound of scalp, initial encounter: Secondary | ICD-10-CM | POA: Insufficient documentation

## 2013-04-12 DIAGNOSIS — IMO0002 Reserved for concepts with insufficient information to code with codable children: Secondary | ICD-10-CM | POA: Insufficient documentation

## 2013-04-12 DIAGNOSIS — Z23 Encounter for immunization: Secondary | ICD-10-CM | POA: Insufficient documentation

## 2013-04-12 DIAGNOSIS — M129 Arthropathy, unspecified: Secondary | ICD-10-CM | POA: Insufficient documentation

## 2013-04-12 DIAGNOSIS — S20211A Contusion of right front wall of thorax, initial encounter: Secondary | ICD-10-CM

## 2013-04-12 DIAGNOSIS — S0101XA Laceration without foreign body of scalp, initial encounter: Secondary | ICD-10-CM

## 2013-04-12 DIAGNOSIS — W1809XA Striking against other object with subsequent fall, initial encounter: Secondary | ICD-10-CM | POA: Insufficient documentation

## 2013-04-12 MED ORDER — TRAMADOL HCL 50 MG PO TABS: 50.0000 mg | ORAL_TABLET | Freq: Four times a day (QID) | ORAL | Status: DC | PRN

## 2013-04-12 MED ORDER — TETANUS-DIPHTH-ACELL PERTUSSIS 5-2.5-18.5 LF-MCG/0.5 IM SUSP
0.5000 mL | Freq: Once | INTRAMUSCULAR | Status: AC
  Administered 2013-04-12: 0.5 mL via INTRAMUSCULAR
  Filled 2013-04-12 (×2): qty 0.5

## 2013-04-12 NOTE — ED Notes (Signed)
Lost her balance while putting groceries away last night, fell, striking her head and now has back pain. Denies LOC.

## 2013-04-12 NOTE — ED Notes (Signed)
MD at bedside. 

## 2013-04-12 NOTE — ED Provider Notes (Signed)
CSN: [id number]     Arrival date & time 04/12/13  0813 History   First MD Initiated Contact with Patient 04/12/13 [phone]     Chief Complaint  Patient presents with  . Fall  . Back Pain  . Head Laceration   (Consider location/radiation/quality/duration/timing/severity/associated sxs/prior Treatment) HPI Patient is a 79 year old female who had a mechanical fall from standing while trying to put groceries last evening. Patient states she fell and struck her head on the floor sustaining a small scalp laceration. She had no loss of consciousness. She denied neck pain. She complains of right thoracic back pain and right hip pain. She's been ambulatory since the fall. She denies any numbness or weakness in any extremity. She denies bowel or bladder incontinence. No recent fevers/ chills, shortness of breath or chest pain. Patient's is no not know when her last tetanus update was given. She denies current headache or vision changes. Past Medical History  Diagnosis Date  . Hypertension   . High cholesterol   . Complication of anesthesia 04/18/2012    "didn't tolerate it today very well; had the shakes and very hard time w/it"  . Arthritis     "in my back"  . Osteoporosis   . Pneumonia     hx of pneumonia- 1971  . Peripheral vascular disease     hx of ligation   . Breast cancer     left breast cancer   . Depression 11/20/2012   Past Surgical History  Procedure Laterality Date  . Knee arthroscopy  04/17/2012    Procedure: ARTHROSCOPY KNEE;  Surgeon: Raymon Mutton, MD;  Location: [facility] OR;  Service: Orthopedics;  Laterality: Right;  . I&d extremity  04/17/2012    Procedure: IRRIGATION AND DEBRIDEMENT EXTREMITY;  Surgeon: Raymon Mutton, MD;  Location: [facility] OR;  Service: Orthopedics;  Laterality: Right;  . Tonsillectomy and adenoidectomy      "I was a child"  . Posterior laminectomy / decompression lumbar spine  1980's  . Breast lumpectomy      left  . Breast biopsy      left  . Menisectomy   04/11/2012    right  . Rotator cuff surgery       right   . Vein ligation    . Total knee arthroplasty Right 10/29/2012    Procedure: RIGHT TOTAL KNEE ARTHROPLASTY;  Surgeon: Loanne Drilling, MD;  Location: [facility] ORS;  Service: Orthopedics;  Laterality: Right;   Family History  Problem Relation Age of Onset  . Kidney failure Mother    History  Substance Use Topics  . Smoking status: Never Smoker   . Smokeless tobacco: Never Used  . Alcohol Use: No     Comment: 04/18/2012 "couple drinks of vodka/day til ~ 6 yr ago; nothing in the last 6 yrs"   OB History   Grav Para Term Preterm Abortions TAB SAB Ect Mult Living                 Review of Systems  Constitutional: Negative for fever and chills.  HENT: Negative for neck pain.   Eyes: Negative for visual disturbance.  Respiratory: Negative for cough and shortness of breath.   Cardiovascular: Negative for chest pain.  Gastrointestinal: Negative for nausea, vomiting and abdominal pain.  Genitourinary: Negative for dysuria, frequency and flank pain.  Musculoskeletal: Positive for myalgias, back pain and arthralgias.  Skin: Positive for wound.  Neurological: Negative for dizziness, syncope, weakness, light-headedness, numbness and headaches.  All other  systems reviewed and are negative.    Allergies  Oysters and Codeine  Home Medications   Current Outpatient Rx  Name  Route  Sig  Dispense  Refill  . acetaminophen (TYLENOL) 500 MG tablet   Oral   Take 1,000 mg by mouth every 6 (six) hours as needed. For pain.         . bisacodyl (DULCOLAX) 10 MG suppository   Rectal   Place 1 suppository (10 mg total) rectally daily as needed.   12 suppository   0   . celecoxib (CELEBREX) 200 MG capsule   Oral   Take 200 mg by mouth daily.          . diphenhydrAMINE (BENADRYL) 12.5 MG/5ML elixir   Oral   Take 5-10 mLs (12.5-25 mg total) by mouth every 4 (four) hours as needed for itching.   120 mL   0   . docusate sodium 100 MG  CAPS   Oral   Take 100 mg by mouth 2 (two) times daily.   60 capsule   0   . escitalopram (LEXAPRO) 10 MG tablet   Oral   Take 10 mg by mouth every morning.          Marland Kitchen HYDROmorphone (DILAUDID) 2 MG tablet      Take 1 tablet every 4 hours as needed for mild pain (1-4) Take 2 tablets every 4 hours for moderate to severe pain (5-10)   360 tablet   0   . methocarbamol (ROBAXIN) 500 MG tablet   Oral   Take 1 tablet (500 mg total) by mouth every 6 (six) hours as needed.   80 tablet   0   . ondansetron (ZOFRAN) 4 MG tablet   Oral   Take 1 tablet (4 mg total) by mouth every 6 (six) hours as needed for nausea.   40 tablet   0   . polyethylene glycol (MIRALAX / GLYCOLAX) packet   Oral   Take 17 g by mouth daily as needed.   14 each   0   . rivaroxaban (XARELTO) 10 MG TABS tablet   Oral   Take 1 tablet (10 mg total) by mouth daily with breakfast.   18 tablet   0   . simvastatin (ZOCOR) 20 MG tablet   Oral   Take 20 mg by mouth every morning.          . traMADol (ULTRAM) 50 MG tablet   Oral   Take 1-2 tablets (50-100 mg total) by mouth every 6 (six) hours as needed.   60 tablet   0   . triamterene-hydrochlorothiazide (MAXZIDE) 75-50 MG per tablet   Oral   Take 1 tablet by mouth daily before breakfast.           BP 160/86  Pulse 72  Temp(Src) 98.4 F (36.9 C) (Oral)  Resp 16  Wt 144 lb (65.318 kg)  BMI 22.55 kg/m2  SpO2 97% Physical Exam  Nursing note and vitals reviewed. Constitutional: She is oriented to person, place, and time. She appears well-developed and well-nourished. No distress.  HENT:  Head: Normocephalic.  Mouth/Throat: Oropharynx is clear and moist.  Patient has a 1 cm scalp laceration to the posterior parietal area on the left. There is no active bleeding. Wound edges are closely approximated. There is no obvious contamination.  Eyes: EOM are normal. Pupils are equal, round, and reactive to light.  Neck: Normal range of motion. Neck  supple.  No midline cervical  spine tenderness. She has full range of motion of her neck.  Cardiovascular: Normal rate and regular rhythm.   Pulmonary/Chest: Effort normal and breath sounds normal. No respiratory distress. She has no wheezes. She has no rales.  Abdominal: Soft. Bowel sounds are normal. She exhibits no distension and no mass. There is no tenderness. There is no rebound and no guarding.  Musculoskeletal: Normal range of motion. She exhibits tenderness (posterior lower ribs. She has obvious contusion in the area. Patient has full range of motion of her bilateral lower extremities. She does complain of some mild right iliac crest pain with palpation no obvious injury. Patient has 2+ distal pulses.). She exhibits no edema.  Neurological: She is alert and oriented to person, place, and time.  Patient is alert and oriented x3 with clear, goal oriented speech. Patient has 5/5 motor in all extremities. Sensation is intact to light touch. Patient has a normal gait and walks without assistance.   Skin: Skin is warm and dry. No rash noted. No erythema.  Contusion to right lower posterior ribs. Multiple old contusions to bilateral upper extremities.  Psychiatric: She has a normal mood and affect. Her behavior is normal.    ED Course  Procedures (including critical care time) Labs Review Labs Reviewed - No data to display Imaging Review No results found.  MDM  Given head injury in an elderly patient will get CT head to rule out subdural bleeding. Suspect right rib contusion versus fracture. Doubt right hip fracture though having pain in the area will obtain x-rays. Tetanus is updated.  X-rays and CT are negative. Patient is ambulatory in the emergency department. We'll treat for contusion and closed head injury. Given the delay in seeking treatment leave the small scalp laceration open to heal secondarily. Return precautions have been given.  Loren Racer, MD 04/12/13 1414

## 2013-09-12 ENCOUNTER — Encounter: Payer: Self-pay | Admitting: Physician Assistant

## 2013-09-19 ENCOUNTER — Ambulatory Visit: Payer: Medicare Other | Admitting: Physician Assistant

## 2013-09-26 ENCOUNTER — Ambulatory Visit (INDEPENDENT_AMBULATORY_CARE_PROVIDER_SITE_OTHER): Payer: Medicare Other | Admitting: Physician Assistant

## 2013-09-26 ENCOUNTER — Encounter: Payer: Self-pay | Admitting: Physician Assistant

## 2013-09-26 VITALS — BP 142/80 | HR 68 | Ht 65.0 in | Wt 161.1 lb

## 2013-09-26 DIAGNOSIS — R195 Other fecal abnormalities: Secondary | ICD-10-CM

## 2013-09-26 DIAGNOSIS — Z853 Personal history of malignant neoplasm of breast: Secondary | ICD-10-CM

## 2013-09-26 MED ORDER — MOVIPREP 100 G PO SOLR: 1.0000 | ORAL | Status: DC

## 2013-09-26 NOTE — Patient Instructions (Signed)
Stop the Aleve.   You have been scheduled for a colonoscopy with propofol. Please follow written instructions given to you at your visit today.  Please pick up your prep kit at the pharmacy within the next 1-3 days. [facility] [address] If you use inhalers (even only as needed), please bring them with you on the day of your procedure.

## 2013-09-26 NOTE — Progress Notes (Signed)
Subjective:    Patient ID: [name], female    DOB: [birth year], 80 y.o.   MRN: [id number]  HPI  [name] is a 80 year old female known previously to Dr. Henrene Pastor from colonoscopy done in 2009 which showed left colon diverticulosis. She is referred back today per Dr. Reynaldo Minium for Hemoccult-positive stool found on Hemoccult testing. Patient has history of breast cancer for which she underwent a left mastectomy, history of depression, previous compression fractures, hypertension, and had undergone a knee replacement within the past year. Patient is currently asymptomatic she has not noted any melena or hematochezia does her bowel movements have been normal she denies any abdominal pain. Her appetite has been good and she has no upper GI symptoms.  She has been on a baby aspirin daily and has  routinely been taking 2 Aleve every morning for arthritic symptoms. Family history is negative for colon cancer/ polyps Most recent labs done 08/28/2013 show hemoglobin of 13.6 hematocrit of 39.9 MCV of 96.    Review of Systems  Constitutional: Negative.   HENT: Negative.   Eyes: Negative.   Respiratory: Negative.   Cardiovascular: Negative.   Gastrointestinal: Negative.   Endocrine: Negative.   Genitourinary: Negative.   Musculoskeletal: Negative.   Allergic/Immunologic: Negative.   Neurological: Negative.   Hematological: Negative.   Psychiatric/Behavioral: Negative.    Outpatient Prescriptions Prior to Visit  Medication Sig Dispense Refill  . escitalopram (LEXAPRO) 10 MG tablet Take 10 mg by mouth every morning.       . simvastatin (ZOCOR) 20 MG tablet Take 20 mg by mouth every morning.       . triamterene-hydrochlorothiazide (MAXZIDE) 75-50 MG per tablet Take 1 tablet by mouth daily before breakfast.       . acetaminophen (TYLENOL) 500 MG tablet Take 1,000 mg by mouth every 6 (six) hours as needed. For pain.      . bisacodyl (DULCOLAX) 10 MG suppository Place 1 suppository (10 mg total)  rectally daily as needed.  12 suppository  0  . celecoxib (CELEBREX) 200 MG capsule Take 200 mg by mouth daily.       . diphenhydrAMINE (BENADRYL) 12.5 MG/5ML elixir Take 5-10 mLs (12.5-25 mg total) by mouth every 4 (four) hours as needed for itching.  120 mL  0  . docusate sodium 100 MG CAPS Take 100 mg by mouth 2 (two) times daily.  60 capsule  0  . HYDROmorphone (DILAUDID) 2 MG tablet Take 1 tablet every 4 hours as needed for mild pain (1-4) Take 2 tablets every 4 hours for moderate to severe pain (5-10)  360 tablet  0  . methocarbamol (ROBAXIN) 500 MG tablet Take 1 tablet (500 mg total) by mouth every 6 (six) hours as needed.  80 tablet  0  . ondansetron (ZOFRAN) 4 MG tablet Take 1 tablet (4 mg total) by mouth every 6 (six) hours as needed for nausea.  40 tablet  0  . polyethylene glycol (MIRALAX / GLYCOLAX) packet Take 17 g by mouth daily as needed.  14 each  0  . rivaroxaban (XARELTO) 10 MG TABS tablet Take 1 tablet (10 mg total) by mouth daily with breakfast.  18 tablet  0  . traMADol (ULTRAM) 50 MG tablet Take 1-2 tablets (50-100 mg total) by mouth every 6 (six) hours as needed.  60 tablet  0  . traMADol (ULTRAM) 50 MG tablet Take 1 tablet (50 mg total) by mouth every 6 (six) hours as needed for pain.  15  tablet  0   No facility-administered medications prior to visit.   Allergies  Allergen Reactions  . Oysters [Shellfish Allergy] Nausea And Vomiting  . Codeine Nausea And Vomiting   Patient Active Problem List   Diagnosis Date Noted  . HX: breast cancer 09/26/2013  . Depression 11/20/2012  . Unspecified constipation 11/20/2012  . Other and unspecified hyperlipidemia 11/20/2012  . Essential hypertension, benign 11/20/2012  . Postoperative anemia due to acute blood loss 11/01/2012  . Hyponatremia 11/01/2012  . Hypokalemia 11/01/2012  . OA (osteoarthritis) of knee 10/29/2012  . Septic joint of right knee joint 05/23/2012  . Elevated liver enzymes 05/23/2012   History  Substance  Use Topics  . Smoking status: Never Smoker   . Smokeless tobacco: Never Used  . Alcohol Use: No     Comment: 04/18/2012 "couple drinks of vodka/day til ~ 6 yr ago; nothing in the last 6 yrs"   family history includes Anuerysm in her father; Kidney failure in her mother.     Objective:   Physical Exam  well-developed elderly white female in no acute distress, pleasant blood pressure 142/80 pulse 68 height 5 foot 5 weight 161. HEENT ;nontraumatic normocephalic EOMI PERRLA sclera anicteric, Supple; no JVD, Cardiovascular ;regular rate and rhythm with S1-S2 no murmur or gallop, Pulm;clear bilaterally, Abdomen; soft nontender nondistended bowel sounds are active there is no palpable mass or hepatosplenomegaly, Rectal ;exam not done stool documented Hemoccult positive, Extremities; no clubbing cyanosis or edema skin warm and dry, Psych; mood and affect appropriate        Assessment & Plan:  #41  80 year old female with Hemoccult-positive stool, normal hemoglobin and asymptomatic. Rule out occult colon lesion. Consider aspirin/NSAID-induced gastropathy #2 diverticulosis #3 personal history of breast cancer #4 hypertension #6 osteoarthritis  Plan; Patient is asked to stop Aleve as she says she takes this more out of habit  than anything at this point it is possible that she may have some gastric irritation account for the Hemoccult-positive stool though she is asymptomatic Will schedule for colonoscopy with Dr. Atilano Ina discussed in detail with the patient and she is agreeable to proceed.

## 2013-09-27 NOTE — Progress Notes (Signed)
Agree with initial assessment and plans 

## 2013-10-02 ENCOUNTER — Encounter: Payer: Self-pay | Admitting: Internal Medicine

## 2013-10-15 ENCOUNTER — Emergency Department (HOSPITAL_COMMUNITY): Payer: Medicare Other

## 2013-10-15 ENCOUNTER — Emergency Department (HOSPITAL_COMMUNITY)
Admission: EM | Admit: 2013-10-15 | Discharge: 2013-10-15 | Disposition: A | Discharge status: Home / Self Care | Payer: Medicare Other | Attending: Emergency Medicine | Admitting: Emergency Medicine

## 2013-10-15 ENCOUNTER — Encounter (HOSPITAL_COMMUNITY): Payer: Self-pay | Admitting: Emergency Medicine

## 2013-10-15 DIAGNOSIS — R296 Repeated falls: Secondary | ICD-10-CM | POA: Insufficient documentation

## 2013-10-15 DIAGNOSIS — R51 Headache: Secondary | ICD-10-CM | POA: Insufficient documentation

## 2013-10-15 DIAGNOSIS — M479 Spondylosis, unspecified: Secondary | ICD-10-CM | POA: Insufficient documentation

## 2013-10-15 DIAGNOSIS — Z79899 Other long term (current) drug therapy: Secondary | ICD-10-CM | POA: Insufficient documentation

## 2013-10-15 DIAGNOSIS — Z853 Personal history of malignant neoplasm of breast: Secondary | ICD-10-CM | POA: Insufficient documentation

## 2013-10-15 DIAGNOSIS — Z7982 Long term (current) use of aspirin: Secondary | ICD-10-CM | POA: Insufficient documentation

## 2013-10-15 DIAGNOSIS — S0190XA Unspecified open wound of unspecified part of head, initial encounter: Secondary | ICD-10-CM | POA: Insufficient documentation

## 2013-10-15 DIAGNOSIS — W19XXXA Unspecified fall, initial encounter: Secondary | ICD-10-CM

## 2013-10-15 DIAGNOSIS — F3289 Other specified depressive episodes: Secondary | ICD-10-CM | POA: Insufficient documentation

## 2013-10-15 DIAGNOSIS — Y93E9 Activity, other interior property and clothing maintenance: Secondary | ICD-10-CM | POA: Insufficient documentation

## 2013-10-15 DIAGNOSIS — S0191XA Laceration without foreign body of unspecified part of head, initial encounter: Secondary | ICD-10-CM

## 2013-10-15 DIAGNOSIS — Z8701 Personal history of pneumonia (recurrent): Secondary | ICD-10-CM | POA: Insufficient documentation

## 2013-10-15 DIAGNOSIS — S199XXA Unspecified injury of neck, initial encounter: Secondary | ICD-10-CM

## 2013-10-15 DIAGNOSIS — M810 Age-related osteoporosis without current pathological fracture: Secondary | ICD-10-CM | POA: Insufficient documentation

## 2013-10-15 DIAGNOSIS — I10 Essential (primary) hypertension: Secondary | ICD-10-CM | POA: Insufficient documentation

## 2013-10-15 DIAGNOSIS — Y9289 Other specified places as the place of occurrence of the external cause: Secondary | ICD-10-CM | POA: Insufficient documentation

## 2013-10-15 DIAGNOSIS — Z7983 Long term (current) use of bisphosphonates: Secondary | ICD-10-CM | POA: Insufficient documentation

## 2013-10-15 DIAGNOSIS — E7800 Pure hypercholesterolemia, unspecified: Secondary | ICD-10-CM | POA: Insufficient documentation

## 2013-10-15 DIAGNOSIS — S0993XA Unspecified injury of face, initial encounter: Secondary | ICD-10-CM | POA: Insufficient documentation

## 2013-10-15 DIAGNOSIS — F329 Major depressive disorder, single episode, unspecified: Secondary | ICD-10-CM | POA: Insufficient documentation

## 2013-10-15 NOTE — ED Provider Notes (Signed)
CSN: [id number]     Arrival date & time 10/15/13  2021 History   First MD Initiated Contact with Patient 10/15/13 2040     Chief Complaint  Patient presents with  . Fall  . Head Laceration     HPI  Patient presents for a fall.  Patient recalls falling backwards, with no loss of consciousness, low substance of pain in her occiput, right lateral neck.  This occurred several hours ago.  Patient is in complete recall of the event. Since the event she said pain, with no syncope, no visual changes. No relief with anything from the pain.  Pain is sore, nonradiating.   Past Medical History  Diagnosis Date  . Hypertension   . High cholesterol   . Complication of anesthesia 04/18/2012    "didn't tolerate it today very well; had the shakes and very hard time w/it"  . Arthritis     "in my back"  . Osteoporosis   . Pneumonia     hx of pneumonia- 1971  . Peripheral vascular disease     hx of ligation   . Breast cancer 1996    left breast cancer   . Depression 11/20/2012  . History of alcoholism     7 1/2 years clean   Past Surgical History  Procedure Laterality Date  . Knee arthroscopy  04/17/2012    Procedure: ARTHROSCOPY KNEE;  Surgeon: Raymon Mutton, MD;  Location: [facility] OR;  Service: Orthopedics;  Laterality: Right;  . I&d extremity  04/17/2012    Procedure: IRRIGATION AND DEBRIDEMENT EXTREMITY;  Surgeon: Raymon Mutton, MD;  Location: [facility] OR;  Service: Orthopedics;  Laterality: Right;  . Tonsillectomy and adenoidectomy      "I was a child"  . Posterior laminectomy / decompression lumbar spine  1980's  . Breast lumpectomy Left 1996  . Menisectomy Right 04/11/2012  . Rotator cuff surgery  Right   . Vein ligation    . Total knee arthroplasty Right 10/29/2012    Procedure: RIGHT TOTAL KNEE ARTHROPLASTY;  Surgeon: Loanne Drilling, MD;  Location: [facility] ORS;  Service: Orthopedics;  Laterality: Right;  . Kyphoplasty  2009, 2013    thorasic, lumbar   Family History  Problem Relation Age of  Onset  . Kidney failure Mother   . Anuerysm Father     AAA   History  Substance Use Topics  . Smoking status: Never Smoker   . Smokeless tobacco: Never Used  . Alcohol Use: No     Comment: 04/18/2012 "couple drinks of vodka/day til ~ 6 yr ago; nothing in the last 6 yrs"   OB History   Grav Para Term Preterm Abortions TAB SAB Ect Mult Living                 Review of Systems  Constitutional:       Per HPI, otherwise negative  HENT:       Per HPI, otherwise negative  Respiratory:       Per HPI, otherwise negative  Cardiovascular:       Per HPI, otherwise negative  Gastrointestinal: Negative for vomiting.  Endocrine:       Negative aside from HPI  Genitourinary:       Neg aside from HPI   Musculoskeletal:       Per HPI, otherwise negative  Skin: Negative.   Neurological: Negative for syncope.      Allergies  Oysters and Codeine  Home Medications   Current Outpatient Rx  Name  Route  Sig  Dispense  Refill  . alendronate (FOSAMAX) 70 MG tablet   Oral   Take 70 mg by mouth once a week.          Marland Kitchen aspirin 81 MG tablet   Oral   Take 81 mg by mouth daily.         . Cholecalciferol (VITAMIN D-3) 5000 UNITS TABS   Oral   Take 1 tablet by mouth daily.         Marland Kitchen escitalopram (LEXAPRO) 10 MG tablet   Oral   Take 10 mg by mouth every morning.          Marland Kitchen MOVIPREP 100 G SOLR   Oral   Take 1 kit (200 g total) by mouth as directed.   1 kit   0     Dispense as written.   . Multiple Vitamin (MULTIVITAMIN) tablet   Oral   Take 1 tablet by mouth daily.         . Naproxen Sodium (ALEVE) 220 MG CAPS   Oral   Take 2 capsules by mouth as needed.         . simvastatin (ZOCOR) 20 MG tablet   Oral   Take 20 mg by mouth every morning.          . triamterene-hydrochlorothiazide (MAXZIDE) 75-50 MG per tablet   Oral   Take 1 tablet by mouth daily before breakfast.           BP 140/58  Pulse 64  Temp(Src) 97.6 F (36.4 C) (Oral)  Resp 20  Ht 5'  6" (1.676 m)  Wt 155 lb (70.308 kg)  BMI 25.03 kg/m2  SpO2 96% Physical Exam  Nursing note and vitals reviewed. Constitutional: She is oriented to person, place, and time. She appears well-developed and well-nourished. No distress.  HENT:  Head: Normocephalic and atraumatic.    Eyes: Conjunctivae and EOM are normal. Pupils are equal, round, and reactive to light.  Neck: Neck supple.    Cardiovascular: Normal rate and regular rhythm.   Pulmonary/Chest: Effort normal and breath sounds normal. No stridor. No respiratory distress.  Abdominal: She exhibits no distension.  Musculoskeletal: She exhibits no edema.  Neurological: She is alert and oriented to person, place, and time. She displays no atrophy and no tremor. No cranial nerve deficit or sensory deficit. She exhibits normal muscle tone. She displays no seizure activity.  Skin: Skin is warm and dry.  Psychiatric: She has a normal mood and affect.    ED Course  Procedures (including critical care time) Labs Review Labs Reviewed - No data to display Imaging Review No results found.  O2- 99%ra, nml   LACERATION REPAIR Performed by: Carmin Muskrat Authorized by: Carmin Muskrat Consent: Verbal consent obtained. Risks and benefits: risks, benefits and alternatives were discussed Consent given by: patient Patient identity confirmed: provided demographic data Prepped and Draped in normal sterile fashion Wound explored  Laceration Location: occiput  Laceration Length: 3cm  No Foreign Bodies seen or palpated    Irrigation method: syringe Amount of cleaning: standard  Skin closure: staples  Number of sutures: 3  Technique: close as possible  Patient tolerance: Patient tolerated the procedure well with no immediate complications.   10:42 PM Patient in no distress.  We reviewed all findings, and return precautions.  MDM  Patient presents after mechanical fall with pain in her occiput.  Patient's incomplete  recall of the event, the traumatic findings, and her pain  all indicate a need for imaging.  Images were reassuring.  Patient was discharged in stable condition after laceration repair    Carmin Muskrat, MD 10/15/13 2242

## 2013-10-15 NOTE — ED Notes (Signed)
Patient states that she "fell backwards"--initially, patient stated that she fell in the bathroom, but then patient stated that she fell in the kitchen while cleaning up  Patient also initially stated that she did not trip over any object, but then stated that she might have--"I don't know. I don't remember." Patient denies LOC, but then states she is not sure of events r/t fall Patient states that she had a similar episode one year ago Large hematoma noted to back of head Patient denies c/o dizziness or lightheadedness

## 2013-10-28 ENCOUNTER — Encounter: Payer: Self-pay | Admitting: Internal Medicine

## 2013-10-28 ENCOUNTER — Ambulatory Visit (AMBULATORY_SURGERY_CENTER): Payer: Medicare Other | Admitting: Internal Medicine

## 2013-10-28 VITALS — BP 149/68 | HR 67 | Temp 97.9°F | Resp 17 | Ht 65.0 in | Wt 161.0 lb

## 2013-10-28 DIAGNOSIS — R195 Other fecal abnormalities: Secondary | ICD-10-CM

## 2013-10-28 DIAGNOSIS — K5730 Diverticulosis of large intestine without perforation or abscess without bleeding: Secondary | ICD-10-CM

## 2013-10-28 MED ORDER — SODIUM CHLORIDE 0.9 % IV SOLN: 500.0000 mL | INTRAVENOUS | Status: DC

## 2013-10-28 NOTE — Op Note (Signed)
[place]  [address]. [place], [address]   COLONOSCOPY PROCEDURE REPORT  PATIENT: Pamela, Davis  MR#: [id number] BIRTHDATE: 12-28-1931 , 89  yrs. old GENDER: Female ENDOSCOPIST: Eustace Quail, MD REFERRED CH:ENIDPOE Reynaldo Minium, M.D. PROCEDURE DATE:  10/28/2013 PROCEDURE:   Colonoscopy, diagnostic First Screening Colonoscopy - Avg.  risk and is 50 yrs.  old or older - No.  Prior Negative Screening - Now for repeat screening. N/A  History of Adenoma - Now for follow-up colonoscopy & has been > or = to 3 yrs.  N/A  Polyps Removed Today? No.  Recommend repeat exam, <10 yrs? No. ASA CLASS:   Class II INDICATIONS:heme-positive stool. Asymptomatic. Normal hemoglobin. Colonoscopy in 2009 with severe diverticulosis only. MEDICATIONS: MAC sedation, administered by CRNA and propofol (Diprivan) 300mg  IV  DESCRIPTION OF PROCEDURE:   After the risks benefits and alternatives of the procedure were thoroughly explained, informed consent was obtained.  A digital rectal exam revealed external hemorrhoids.   The LB [id number]  endoscope was introduced through the anus and advanced to the cecum, which was identified by both the appendix and ileocecal valve. No adverse events experienced.   The quality of the prep was good, using MoviPrep The instrument was then slowly withdrawn as the colon was fully examined.      COLON FINDINGS: Severe diverticulosis was noted throughout the entire examined colon.   Mild melanosis was found throughout the entire examined colon.   The colon mucosa was otherwise normal. No polyps or cancers seen.  Retroflexed views revealed internal hemorrhoids. The time to cecum=6 minutes 48 seconds.  Withdrawal time=10 minutes 08 seconds.  The scope was withdrawn and the procedure completed. COMPLICATIONS: There were no complications.  ENDOSCOPIC IMPRESSION: 1.   Severe diverticulosis was noted throughout the entire examined colon 2.   Mild  melanosis was found throughout the entire examined colon 3.   The colon mucosa was otherwise normal  RECOMMENDATIONS: 1. Return to the care of your primary provider.  GI follow up as needed   eSigned:  Eustace Quail, MD 10/28/2013 3:03 PM   cc: Burnard Bunting, MD and The Patient

## 2013-10-28 NOTE — Progress Notes (Signed)
Procedure ends, to recovery, report given and VSS. 

## 2013-10-28 NOTE — Patient Instructions (Signed)
YOU HAD AN ENDOSCOPIC PROCEDURE TODAY AT THE [facility]: Refer to the procedure report that was given to you for any specific questions about what was found during the examination.  If the procedure report does not answer your questions, please call your gastroenterologist to clarify.  If you requested that your care partner not be given the details of your procedure findings, then the procedure report has been included in a sealed envelope for you to review at your convenience later.  YOU SHOULD EXPECT: Some feelings of bloating in the abdomen. Passage of more gas than usual.  Walking can help get rid of the air that was put into your GI tract during the procedure and reduce the bloating. If you had a lower endoscopy (such as a colonoscopy or flexible sigmoidoscopy) you may notice spotting of blood in your stool or on the toilet paper. If you underwent a bowel prep for your procedure, then you may not have a normal bowel movement for a few days.  DIET: Your first meal following the procedure should be a light meal and then it is ok to progress to your normal diet.  A half-sandwich or bowl of soup is an example of a good first meal.  Heavy or fried foods are harder to digest and may make you feel nauseous or bloated.  Likewise meals heavy in dairy and vegetables can cause extra gas to form and this can also increase the bloating.  Drink plenty of fluids but you should avoid alcoholic beverages for 24 hours.  ACTIVITY: Your care partner should take you home directly after the procedure.  You should plan to take it easy, moving slowly for the rest of the day.  You can resume normal activity the day after the procedure however you should NOT DRIVE or use heavy machinery for 24 hours (because of the sedation medicines used during the test).    SYMPTOMS TO REPORT IMMEDIATELY: A gastroenterologist can be reached at any hour.  During normal business hours, 8:30 AM to 5:00 PM Monday through Friday,  call [phone].  After hours and on weekends, please call the GI answering service at [phone] who will take a message and have the physician on call contact you.   Following lower endoscopy (colonoscopy or flexible sigmoidoscopy):  Excessive amounts of blood in the stool  Significant tenderness or worsening of abdominal pains  Swelling of the abdomen that is new, acute  Fever of 100F or higher    FOLLOW UP: If any biopsies were taken you will be contacted by phone or by letter within the next 1-3 weeks.  Call your gastroenterologist if you have not heard about the biopsies in 3 weeks.  Our staff will call the home number listed on your records the next business day following your procedure to check on you and address any questions or concerns that you may have at that time regarding the information given to you following your procedure. This is a courtesy call and so if there is no answer at the home number and we have not heard from you through the emergency physician on call, we will assume that you have returned to your regular daily activities without incident.  SIGNATURES/CONFIDENTIALITY: You and/or your care partner have signed paperwork which will be entered into your electronic medical record.  These signatures attest to the fact that that the information above on your After Visit Summary has been reviewed and is understood.  Full responsibility of the confidentiality   of this discharge information lies with you and/or your care-partner.     

## 2013-10-29 ENCOUNTER — Telehealth: Payer: Self-pay

## 2013-10-29 NOTE — Telephone Encounter (Signed)
  Follow up Call-  Call back number 10/28/2013  Post procedure Call Back phone  # [phone]  Permission to leave phone message Yes     Patient questions:  Do you have a fever, pain , or abdominal swelling? no Pain Score  0 *  Have you tolerated food without any problems? yes  Have you been able to return to your normal activities? yes  Do you have any questions about your discharge instructions: Diet   no Medications  no Follow up visit  no  Do you have questions or concerns about your Care? no  Actions: * If pain score is 4 or above: No action needed, pain <4.  No problems per the pt. Maw

## 2014-04-08 ENCOUNTER — Other Ambulatory Visit: Payer: Self-pay

## 2014-04-08 DIAGNOSIS — Z1231 Encounter for screening mammogram for malignant neoplasm of breast: Secondary | ICD-10-CM

## 2014-04-08 DIAGNOSIS — Z853 Personal history of malignant neoplasm of breast: Secondary | ICD-10-CM

## 2014-04-21 ENCOUNTER — Ambulatory Visit
Admission: RE | Admit: 2014-04-21 | Discharge: 2014-04-21 | Disposition: A | Discharge status: Home / Self Care | Payer: Medicare Other | Source: Ambulatory Visit

## 2014-04-21 DIAGNOSIS — Z1231 Encounter for screening mammogram for malignant neoplasm of breast: Secondary | ICD-10-CM

## 2014-04-21 DIAGNOSIS — Z853 Personal history of malignant neoplasm of breast: Secondary | ICD-10-CM

## 2014-06-10 ENCOUNTER — Emergency Department (HOSPITAL_COMMUNITY): Payer: Medicare Other

## 2014-06-10 ENCOUNTER — Encounter (HOSPITAL_COMMUNITY): Payer: Self-pay | Admitting: *Deleted

## 2014-06-10 ENCOUNTER — Emergency Department (HOSPITAL_COMMUNITY)
Admission: EM | Admit: 2014-06-10 | Discharge: 2014-06-10 | Disposition: A | Discharge status: Home / Self Care | Payer: Medicare Other | Attending: Emergency Medicine | Admitting: Emergency Medicine

## 2014-06-10 DIAGNOSIS — Z8701 Personal history of pneumonia (recurrent): Secondary | ICD-10-CM | POA: Diagnosis not present

## 2014-06-10 DIAGNOSIS — W2203XA Walked into furniture, initial encounter: Secondary | ICD-10-CM | POA: Insufficient documentation

## 2014-06-10 DIAGNOSIS — S0990XA Unspecified injury of head, initial encounter: Secondary | ICD-10-CM | POA: Diagnosis present

## 2014-06-10 DIAGNOSIS — Y998 Other external cause status: Secondary | ICD-10-CM | POA: Insufficient documentation

## 2014-06-10 DIAGNOSIS — Z791 Long term (current) use of non-steroidal anti-inflammatories (NSAID): Secondary | ICD-10-CM | POA: Insufficient documentation

## 2014-06-10 DIAGNOSIS — Z79899 Other long term (current) drug therapy: Secondary | ICD-10-CM | POA: Diagnosis not present

## 2014-06-10 DIAGNOSIS — M1990 Unspecified osteoarthritis, unspecified site: Secondary | ICD-10-CM | POA: Insufficient documentation

## 2014-06-10 DIAGNOSIS — Z853 Personal history of malignant neoplasm of breast: Secondary | ICD-10-CM | POA: Diagnosis not present

## 2014-06-10 DIAGNOSIS — W19XXXA Unspecified fall, initial encounter: Secondary | ICD-10-CM

## 2014-06-10 DIAGNOSIS — Z7982 Long term (current) use of aspirin: Secondary | ICD-10-CM | POA: Diagnosis not present

## 2014-06-10 DIAGNOSIS — E780 Pure hypercholesterolemia: Secondary | ICD-10-CM | POA: Diagnosis not present

## 2014-06-10 DIAGNOSIS — M810 Age-related osteoporosis without current pathological fracture: Secondary | ICD-10-CM | POA: Diagnosis not present

## 2014-06-10 DIAGNOSIS — S0191XA Laceration without foreign body of unspecified part of head, initial encounter: Secondary | ICD-10-CM

## 2014-06-10 DIAGNOSIS — Y9389 Activity, other specified: Secondary | ICD-10-CM | POA: Diagnosis not present

## 2014-06-10 DIAGNOSIS — Y92010 Kitchen of single-family (private) house as the place of occurrence of the external cause: Secondary | ICD-10-CM | POA: Diagnosis not present

## 2014-06-10 DIAGNOSIS — R2689 Other abnormalities of gait and mobility: Secondary | ICD-10-CM | POA: Diagnosis not present

## 2014-06-10 DIAGNOSIS — S0101XA Laceration without foreign body of scalp, initial encounter: Secondary | ICD-10-CM | POA: Diagnosis not present

## 2014-06-10 DIAGNOSIS — I10 Essential (primary) hypertension: Secondary | ICD-10-CM | POA: Insufficient documentation

## 2014-06-10 DIAGNOSIS — F329 Major depressive disorder, single episode, unspecified: Secondary | ICD-10-CM | POA: Insufficient documentation

## 2014-06-10 DIAGNOSIS — R2681 Unsteadiness on feet: Secondary | ICD-10-CM

## 2014-06-10 LAB — CBC WITH DIFFERENTIAL/PLATELET
BASOS ABS: 0 10*3/uL (ref 0.0–0.1)
BASOS PCT: 0 % (ref 0–1)
Eosinophils Absolute: 0.2 10*3/uL (ref 0.0–0.7)
Eosinophils Relative: 3 % (ref 0–5)
HEMATOCRIT: 38.5 % (ref 36.0–46.0)
Hemoglobin: 12.9 g/dL (ref 12.0–15.0)
Lymphocytes Relative: 40 % (ref 12–46)
Lymphs Abs: 2.8 10*3/uL (ref 0.7–4.0)
MCH: 32.1 pg (ref 26.0–34.0)
MCHC: 33.5 g/dL (ref 30.0–36.0)
MCV: 95.8 fL (ref 78.0–100.0)
Monocytes Absolute: 0.6 10*3/uL (ref 0.1–1.0)
Monocytes Relative: 9 % (ref 3–12)
NEUTROS ABS: 3.3 10*3/uL (ref 1.7–7.7)
Neutrophils Relative %: 48 % (ref 43–77)
PLATELETS: 207 10*3/uL (ref 150–400)
RBC: 4.02 MIL/uL (ref 3.87–5.11)
RDW: 13.8 % (ref 11.5–15.5)
WBC: 6.9 10*3/uL (ref 4.0–10.5)

## 2014-06-10 LAB — BASIC METABOLIC PANEL
ANION GAP: 12 (ref 5–15)
BUN: 33 mg/dL — ABNORMAL HIGH (ref 6–23)
CALCIUM: 9.9 mg/dL (ref 8.4–10.5)
CHLORIDE: 96 meq/L (ref 96–112)
CO2: 28 mEq/L (ref 19–32)
Creatinine, Ser: 1 mg/dL (ref 0.50–1.10)
GFR calc non Af Amer: 52 mL/min — ABNORMAL LOW (ref >90)
GFR, EST AFRICAN AMERICAN: 60 mL/min — AB (ref >90)
Glucose, Bld: 93 mg/dL (ref 70–99)
Potassium: 4 mEq/L (ref 3.7–5.3)
Sodium: 136 mEq/L — ABNORMAL LOW (ref 137–147)

## 2014-06-10 MED ORDER — LIDOCAINE-EPINEPHRINE 1 %-1:100000 IJ SOLN: 30.0000 mL | Freq: Once | INTRAMUSCULAR | Status: DC

## 2014-06-10 MED ORDER — LIDOCAINE-EPINEPHRINE (PF) 2 %-1:200000 IJ SOLN
20.0000 mL | Freq: Once | INTRAMUSCULAR | Status: DC
Start: 2014-06-10 — End: 2014-06-10
  Filled 2014-06-10: qty 20

## 2014-06-10 MED ORDER — MORPHINE SULFATE 2 MG/ML IJ SOLN: 1.0000 mg | Freq: Once | INTRAMUSCULAR | Status: DC

## 2014-06-10 MED ORDER — ACETAMINOPHEN 325 MG PO TABS
650.0000 mg | ORAL_TABLET | Freq: Once | ORAL | Status: AC
  Administered 2014-06-10: 650 mg via ORAL
  Filled 2014-06-10: qty 2

## 2014-06-10 NOTE — ED Provider Notes (Signed)
CSN: [id number]     Arrival date & time 06/10/14  1928 History   First MD Initiated Contact with Patient 06/10/14 2030     Chief Complaint  Patient presents with  . Head Laceration  . Fall     (Consider location/radiation/quality/duration/timing/severity/associated sxs/prior Treatment) HPI  [name] is a 80 y.o. female with PMH of HTN, dyslipidemia, PVD, depression presenting after A fall while she was working in her kitchen. Patient states that she slipped but she is unsure of how she fell. Patient struck her head on the corner of a granite countertop. Patient with laceration to the back of her head bleeding controlled at present. Patient denies headache and loss of consciousness. She denies slurred speech or visual changes no nausea or vomiting no weakness. Patient uses a daily aspirin but no other anticoagulants. Patient denies fevers chills stiff neck or neck pain. She denies lightheadedness, chest pain, she said breath or abdominal pain. No back pain. Pt normally ambulates without any assistive device.   Past Medical History  Diagnosis Date  . Hypertension   . High cholesterol   . Complication of anesthesia 04/18/2012    "didn't tolerate it today very well; had the shakes and very hard time w/it"  . Arthritis     "in my back"  . Osteoporosis   . Pneumonia     hx of pneumonia- 1971  . Peripheral vascular disease     hx of ligation   . Breast cancer 1996    left breast cancer   . Depression 11/20/2012  . History of alcoholism     7 1/2 years clean   Past Surgical History  Procedure Laterality Date  . Knee arthroscopy  04/17/2012    Procedure: ARTHROSCOPY KNEE;  Surgeon: Rudean Haskell, MD;  Location: [place];  Service: Orthopedics;  Laterality: Right;  . I&d extremity  04/17/2012    Procedure: IRRIGATION AND DEBRIDEMENT EXTREMITY;  Surgeon: Rudean Haskell, MD;  Location: [place];  Service: Orthopedics;  Laterality: Right;  . Tonsillectomy and adenoidectomy      "I was a  child"  . Posterior laminectomy / decompression lumbar spine  1980's  . Breast lumpectomy Left 1996  . Menisectomy Right 04/11/2012  . Rotator cuff surgery  Right   . Vein ligation    . Total knee arthroplasty Right 10/29/2012    Procedure: RIGHT TOTAL KNEE ARTHROPLASTY;  Surgeon: Gearlean Alf, MD;  Location: [facility];  Service: Orthopedics;  Laterality: Right;  . Kyphoplasty  2009, 2013    thorasic, lumbar   Family History  Problem Relation Age of Onset  . Kidney failure Mother   . Anuerysm Father     AAA   History  Substance Use Topics  . Smoking status: Never Smoker   . Smokeless tobacco: Never Used  . Alcohol Use: No     Comment: 04/18/2012 "couple drinks of vodka/day til ~ 6 yr ago; nothing in the last 6 yrs"   OB History    No data available     Review of Systems  Constitutional: Negative for fever and chills.  HENT: Negative for congestion and rhinorrhea.   Eyes: Negative for visual disturbance.  Respiratory: Negative for cough and shortness of breath.   Cardiovascular: Negative for chest pain.  Gastrointestinal: Negative for nausea, vomiting and diarrhea.  Musculoskeletal: Negative for back pain and gait problem.  Skin: Negative for rash.  Neurological: Negative for weakness and headaches.      Allergies  Oysters and Codeine  Home Medications   Prior to Admission medications   Medication Sig Start Date End Date Taking? Authorizing Provider  alendronate (FOSAMAX) 70 MG tablet Take 70 mg by mouth once a week. Every Sunday. 08/21/13  Yes Historical Provider, MD  aspirin 81 MG tablet Take 81 mg by mouth daily.   Yes Historical Provider, MD  Cholecalciferol (VITAMIN D-3) 5000 UNITS TABS Take 1 tablet by mouth daily.   Yes Historical Provider, MD  escitalopram (LEXAPRO) 10 MG tablet Take 10 mg by mouth every morning.    Yes Historical Provider, MD  Multiple Vitamin (MULTIVITAMIN) tablet Take 1 tablet by mouth daily.   Yes Historical Provider, MD  Naproxen Sodium  (ALEVE) 220 MG CAPS Take 2 capsules by mouth daily.    Yes Historical Provider, MD  simvastatin (ZOCOR) 20 MG tablet Take 20 mg by mouth every morning.    Yes Historical Provider, MD  triamterene-hydrochlorothiazide (MAXZIDE) 75-50 MG per tablet Take 1 tablet by mouth daily before breakfast.    Yes Historical Provider, MD   BP 110/58 mmHg  Pulse 70  Temp(Src) 97.8 F (36.6 C) (Oral)  Resp 18  SpO2 99% Physical Exam  Constitutional: She appears well-developed and well-nourished. No distress.  HENT:  Head: Normocephalic and atraumatic.  Mouth/Throat: Oropharynx is clear and moist.  Eyes: Conjunctivae and EOM are normal. Pupils are equal, round, and reactive to light. Right eye exhibits no discharge. Left eye exhibits no discharge.  Neck: Normal range of motion. Neck supple.  No nuchal rigidity  Cardiovascular: Normal rate and regular rhythm.   Pulmonary/Chest: Effort normal and breath sounds normal. No respiratory distress. She has no wheezes.  Abdominal: Soft. Bowel sounds are normal. She exhibits no distension. There is no tenderness.  Neurological: She is alert. No cranial nerve deficit. Coordination normal.  Speech is clear and goal oriented. Peripheral visual fields intact. Strength 5/5 in upper and lower extremities. Sensation intact. Intact rapid alternating movements, finger to nose, and heel to shin.  No pronator drift. Unsteady gait. Pt requires assistance with walk.   Skin: Skin is warm and dry. She is not diaphoretic.  Nursing note and vitals reviewed.   ED Course  Procedures (including critical care time) Labs Review Labs Reviewed  BASIC METABOLIC PANEL - Abnormal; Notable for the following:    Sodium 136 (*)    BUN 33 (*)    GFR calc non Af Amer 52 (*)    GFR calc Af Amer 60 (*)    All other components within normal limits  CBC WITH DIFFERENTIAL    Imaging Review Ct Head Wo Contrast  06/10/2014   CLINICAL DATA:  Fall, striking the posterior head on a counter  corner. Scalp laceration.  EXAM: CT HEAD WITHOUT CONTRAST  TECHNIQUE: Contiguous axial images were obtained from the base of the skull through the vertex without intravenous contrast.  COMPARISON:  10/15/2013  FINDINGS: Age-appropriate atrophy. Periventricular white matter and corona radiata hypodensities favor chronic ischemic microvascular white matter disease. Otherwise, The brainstem, cerebellum, cerebral peduncles, thalamus, basal ganglia, basilar cisterns, and ventricular system appear within normal limits. No intracranial hemorrhage, mass lesion, or acute CVA. Left posterior parietal scalp hematoma and scalp laceration. The small inner table irregularity in the left parietal bone on images 39-43 of series 3 is stable from March and accordingly not thought to be a fracture but rather a vascular groove.  As best I can tell, the chronically nonunited odontoid fracture does not appear significantly posteriorly displaced  on the lateral scout image of the head, although admittedly this provides only a limited assessment.  IMPRESSION: 1. No acute intracranial findings or change from prior. Chronic microvascular white matter disease is observed. 2. Left parietal scalp hematoma and scalp laceration. 3. Grossly, the chronically nonunited odontoid fracture does not appear significantly posteriorly displaced. This is viewed on the lateral scout image of the head.   Electronically Signed   By: Sherryl Barters M.D.   On: 06/10/2014 21:44   LACERATION REPAIR Performed by: [facility] by: Pura Spice Consent: Verbal consent obtained. Risks and benefits: risks, benefits and alternatives were discussed Consent given by: patient Patient identity confirmed: provided demographic data Prepped and Draped in normal sterile fashion Wound explored  Laceration Location: posterior scalp  Laceration Length: 6cm  No Foreign Bodies seen or palpated  Anesthesia: local infiltration  Local  anesthetic: lidocaine 2% with epinephrine  Anesthetic total: 7-8 ml  Irrigation method: syringe Amount of cleaning: standard  Skin closure: staples  Number of sutures: 7  Patient tolerance: Patient tolerated the procedure well with no immediate complications.    EKG Interpretation   Date/Time:  Tuesday June 10 2014 20:51:04 EST Ventricular Rate:  63 PR Interval:  208 QRS Duration: 118 QT Interval:  487 QTC Calculation: 499 R Axis:   51 Text Interpretation:  Sinus rhythm Atrial premature complex Nonspecific  intraventricular conduction delay Borderline T abnormalities, anterior  leads No significant change since last tracing Confirmed by Randall  MD,  [employer] [id number]) on 06/10/2014 9:55:22 PM      MDM   Final diagnoses:  Head injury  Laceration of head, initial encounter  Fall, initial encounter  Gait instability   Pt presents after a fall. Pt unsure how she fell but reports that she slipped. She doesn't think she had a prodrome. She denies LOC and states she remembers the entire incident. Pt with head injury and scalp laceration. No slurred speech, visual changes, nausea, vomiting or weakness. VSS. Normal neurological except unsteady gait that is new. Labs noncontributory and Heat CT without acute intracranial findings or changes from prior. EKG without changes. Laceration repaired without complications. Concerned with unsteady gait. Recommend brain MRI but patient refused. Pt wants to go home. Pt states will go see PCP tomorrow. Plan for patient to see PCP tomorrow and if she cannot be seen she is to present to the ED for further evaluation. The consequences of going home without further testing were discussed and the patient verbalized understanding. Return precautions were discussed in detail and the pt verbalized understanding.  Discussed all results and patient verbalizes understanding and agrees with plan.  This is a shared patient. This patient was discussed with  the physician, Dr. Tawnya Crook who saw and evaluated the patient and agrees with the plan.     Pura Spice, PA-C 06/11/14 [place], MD 06/12/14 1120

## 2014-06-10 NOTE — Discharge Instructions (Signed)
Return to the emergency room with worsening of symptoms, new symptoms or with symptoms that are concerning, especially severe worsening of gait instability, headache, visual or speech changes, weakness in face, arms or legs. Treat headache with tylenol and ibuprofen.  Follow up with your primary care provider tomorrow for evaluation of unsteady gait. If you cannot get an appointment come back to the ED to be evaluated for your unsteady gait.   Keep wound dry. After that, wash gently morning and night (every 12 hours) with soap and water. You may use a topical antibiotic ointment and cover with a bandaid or gauze.    Do NOT use rubbing alcohol or hydrogen peroxide, do not soak the area   Present to your primary care doctor or the urgent care of your choice, or the ED for suture removal in 7-10 days.   Every attempt was made to remove foreign body (contaminants) from the wound.  However, there is always a chance that some may remain in the wound. This can  increase your risk of infection.   If you see signs of infection (warmth, redness, tenderness, pus, sharp increase in pain, fever, red streaking in the skin) immediately return to the emergency department.   After the wound heals fully, apply sunscreen for 6-12 months to minimize scarring.

## 2014-06-10 NOTE — ED Notes (Signed)
Pt states that she was working in her kitchen and is unsure how she fell; pt reports that she fell and struck the corner of the cabinet; pt with laceration to back of head; bleeding controlled at present; pt is unsure if she had a LOC

## 2014-09-04 ENCOUNTER — Emergency Department (HOSPITAL_COMMUNITY): Payer: Medicare Other

## 2014-09-04 ENCOUNTER — Encounter (HOSPITAL_COMMUNITY): Payer: Self-pay | Admitting: Emergency Medicine

## 2014-09-04 ENCOUNTER — Observation Stay (HOSPITAL_COMMUNITY)
Admission: EM | Admit: 2014-09-04 | Discharge: 2014-09-05 | Disposition: A | Discharge status: Home / Self Care | Payer: Medicare Other | Attending: Internal Medicine | Admitting: Internal Medicine

## 2014-09-04 DIAGNOSIS — S2242XA Multiple fractures of ribs, left side, initial encounter for closed fracture: Secondary | ICD-10-CM | POA: Insufficient documentation

## 2014-09-04 DIAGNOSIS — Z91018 Allergy to other foods: Secondary | ICD-10-CM | POA: Diagnosis not present

## 2014-09-04 DIAGNOSIS — F329 Major depressive disorder, single episode, unspecified: Secondary | ICD-10-CM | POA: Insufficient documentation

## 2014-09-04 DIAGNOSIS — M810 Age-related osteoporosis without current pathological fracture: Secondary | ICD-10-CM | POA: Insufficient documentation

## 2014-09-04 DIAGNOSIS — S2232XA Fracture of one rib, left side, initial encounter for closed fracture: Secondary | ICD-10-CM

## 2014-09-04 DIAGNOSIS — M4690 Unspecified inflammatory spondylopathy, site unspecified: Secondary | ICD-10-CM | POA: Insufficient documentation

## 2014-09-04 DIAGNOSIS — F1021 Alcohol dependence, in remission: Secondary | ICD-10-CM | POA: Diagnosis not present

## 2014-09-04 DIAGNOSIS — Z853 Personal history of malignant neoplasm of breast: Secondary | ICD-10-CM | POA: Diagnosis not present

## 2014-09-04 DIAGNOSIS — R079 Chest pain, unspecified: Secondary | ICD-10-CM | POA: Insufficient documentation

## 2014-09-04 DIAGNOSIS — Z885 Allergy status to narcotic agent status: Secondary | ICD-10-CM | POA: Diagnosis not present

## 2014-09-04 DIAGNOSIS — Z79899 Other long term (current) drug therapy: Secondary | ICD-10-CM | POA: Diagnosis not present

## 2014-09-04 DIAGNOSIS — S0101XA Laceration without foreign body of scalp, initial encounter: Secondary | ICD-10-CM | POA: Diagnosis present

## 2014-09-04 DIAGNOSIS — W109XXA Fall (on) (from) unspecified stairs and steps, initial encounter: Secondary | ICD-10-CM | POA: Insufficient documentation

## 2014-09-04 DIAGNOSIS — Z7982 Long term (current) use of aspirin: Secondary | ICD-10-CM | POA: Diagnosis not present

## 2014-09-04 DIAGNOSIS — I10 Essential (primary) hypertension: Secondary | ICD-10-CM | POA: Diagnosis not present

## 2014-09-04 DIAGNOSIS — Y929 Unspecified place or not applicable: Secondary | ICD-10-CM | POA: Diagnosis not present

## 2014-09-04 DIAGNOSIS — S2239XA Fracture of one rib, unspecified side, initial encounter for closed fracture: Secondary | ICD-10-CM | POA: Diagnosis present

## 2014-09-04 DIAGNOSIS — W108XXA Fall (on) (from) other stairs and steps, initial encounter: Secondary | ICD-10-CM

## 2014-09-04 DIAGNOSIS — I739 Peripheral vascular disease, unspecified: Secondary | ICD-10-CM | POA: Insufficient documentation

## 2014-09-04 MED ORDER — ACETAMINOPHEN 325 MG PO TABS
650.0000 mg | ORAL_TABLET | Freq: Once | ORAL | Status: AC
  Administered 2014-09-05: 650 mg via ORAL
  Filled 2014-09-04: qty 2

## 2014-09-04 NOTE — ED Notes (Signed)
Patient was walking downstairs and tripped. Patient has a laceration on left side head. Patient is having pain on left ribs. Patient is having lower back pain.

## 2014-09-04 NOTE — ED Notes (Signed)
Bed: [facility] Expected date: 09/04/14 Expected time: 8:03 PM Means of arrival: Ambulance Comments: Fall, head lac, rib pain

## 2014-09-04 NOTE — ED Provider Notes (Signed)
CSN: [id number]     Arrival date & time 09/04/14  2020 History   First MD Initiated Contact with Patient 09/04/14 2033     Chief Complaint  Patient presents with  . Fall    Patient was walking downstairs and tripped. Patient has a laceration on left side head. Patient is having pain on left ribs. Patient is having lower back pain.     (Consider location/radiation/quality/duration/timing/severity/associated sxs/prior Treatment) HPI Comments: 81 year old female who presents after falling down the stairs. She does not recall exactly what made her fall.  She remembers waking up at the base of the stairs. She was unable to get up due to severe pain in her left chest. So, she remained on the floor at the base of the stairs for approximately 3 hours until her husband arrived home. At that point, he called EMS.  On arrival to the emergency department, she complained of moderate left-sided chest pain and headache. She denied neck pain, weakness, shortness of breath, nausea, or abdominal pain.  Patient is a 81 y.o. female presenting with fall.  Fall This is a new problem. The current episode started 3 to 5 hours ago. Episode frequency: once. The problem has been resolved. Associated symptoms include chest pain and headaches. Pertinent negatives include no abdominal pain and no shortness of breath. Exacerbated by: movement. Nothing relieves the symptoms.    Past Medical History  Diagnosis Date  . Hypertension   . High cholesterol   . Complication of anesthesia 04/18/2012    "didn't tolerate it today very well; had the shakes and very hard time w/it"  . Arthritis     "in my back"  . Osteoporosis   . Pneumonia     hx of pneumonia- 1971  . Peripheral vascular disease     hx of ligation   . Breast cancer 1996    left breast cancer   . Depression 11/20/2012  . History of alcoholism     7 1/2 years clean   Past Surgical History  Procedure Laterality Date  . Knee arthroscopy  04/17/2012   Procedure: ARTHROSCOPY KNEE;  Surgeon: Rudean Haskell, MD;  Location: [place];  Service: Orthopedics;  Laterality: Right;  . I&d extremity  04/17/2012    Procedure: IRRIGATION AND DEBRIDEMENT EXTREMITY;  Surgeon: Rudean Haskell, MD;  Location: [place];  Service: Orthopedics;  Laterality: Right;  . Tonsillectomy and adenoidectomy      "I was a child"  . Posterior laminectomy / decompression lumbar spine  1980's  . Breast lumpectomy Left 1996  . Menisectomy Right 04/11/2012  . Rotator cuff surgery  Right   . Vein ligation    . Total knee arthroplasty Right 10/29/2012    Procedure: RIGHT TOTAL KNEE ARTHROPLASTY;  Surgeon: Gearlean Alf, MD;  Location: [facility];  Service: Orthopedics;  Laterality: Right;  . Kyphoplasty  2009, 2013    thorasic, lumbar   Family History  Problem Relation Age of Onset  . Kidney failure Mother   . Anuerysm Father     AAA   History  Substance Use Topics  . Smoking status: Never Smoker   . Smokeless tobacco: Never Used  . Alcohol Use: No     Comment: 04/18/2012 "couple drinks of vodka/day til ~ 6 yr ago; nothing in the last 6 yrs"   OB History    No data available     Review of Systems  Respiratory: Negative for shortness of breath.   Cardiovascular: Positive for chest  pain.  Gastrointestinal: Negative for abdominal pain.  Neurological: Positive for headaches.  All other systems reviewed and are negative.     Allergies  Oysters and Codeine  Home Medications   Prior to Admission medications   Medication Sig Start Date End Date Taking? Authorizing Provider  alendronate (FOSAMAX) 70 MG tablet Take 70 mg by mouth once a week. Every Sunday. 08/21/13  Yes Historical Provider, MD  aspirin 81 MG tablet Take 81 mg by mouth daily.   Yes Historical Provider, MD  Cholecalciferol (VITAMIN D-3) 5000 UNITS TABS Take 1 tablet by mouth daily.   Yes Historical Provider, MD  escitalopram (LEXAPRO) 10 MG tablet Take 10 mg by mouth every morning.    Yes Historical  Provider, MD  Multiple Vitamin (MULTIVITAMIN) tablet Take 1 tablet by mouth daily.   Yes Historical Provider, MD  Naproxen Sodium (ALEVE) 220 MG CAPS Take 2 capsules by mouth daily.    Yes Historical Provider, MD  triamterene-hydrochlorothiazide (MAXZIDE) 75-50 MG per tablet Take 1 tablet by mouth daily before breakfast.    Yes Historical Provider, MD   BP 96/55 mmHg  Pulse 85  Temp(Src) 97.9 F (36.6 C) (Oral)  Resp 17  SpO2 98% Physical Exam  Constitutional: She is oriented to person, place, and time. She appears well-developed and well-nourished. No distress.  HENT:  Head: Normocephalic and atraumatic. Head is without raccoon's eyes and without Battle's sign.    Nose: Nose normal.  Eyes: Conjunctivae and EOM are normal. Pupils are equal, round, and reactive to light. No scleral icterus.  Neck: No spinous process tenderness and no muscular tenderness present.  Cardiovascular: Normal rate, regular rhythm, normal heart sounds and intact distal pulses.   No murmur heard. Pulmonary/Chest: Effort normal and breath sounds normal. She has no rales. She exhibits no tenderness.  Abdominal: Soft. There is no tenderness. There is no rebound and no guarding.  Musculoskeletal: Normal range of motion. She exhibits no edema.       Left shoulder: She exhibits normal range of motion.       Thoracic back: She exhibits tenderness (Left thoracic) and bony tenderness (upper thoracic).       Lumbar back: She exhibits no tenderness and no bony tenderness.       Left upper arm: She exhibits swelling (large hematoma). She exhibits no tenderness and no deformity.  No evidence of trauma to extremities, except as noted.  2+ distal pulses.      Neurological: She is alert and oriented to person, place, and time.  Skin: Skin is warm and dry. No rash noted.  Psychiatric: She has a normal mood and affect.  Nursing note and vitals reviewed.   ED Course  LACERATION REPAIR Date/Time: 09/05/2014 12:42  AM Performed by: Cyd Silence DAVID Authorized by: Cyd Silence DAVID Consent: Verbal consent obtained. Risks and benefits: risks, benefits and alternatives were discussed Consent given by: patient and spouse Body area: head/neck Location details: scalp Laceration length: 2 cm Foreign bodies: no foreign bodies Tendon involvement: none Nerve involvement: none Vascular damage: no Irrigation solution: saline Irrigation method: jet lavage Amount of cleaning: extensive Debridement: none Degree of undermining: none Skin closure: staples Number of sutures: 2 Technique: simple Approximation: close Approximation difficulty: simple Patient tolerance: Patient tolerated the procedure well with no immediate complications   (including critical care time) Labs Review Labs Reviewed - No data to display  Imaging Review Dg Chest 1 View  09/04/2014   CLINICAL DATA:  Recent fall bowel  steps with left-sided chest pain  EXAM: CHEST  1 VIEW  COMPARISON:  04/02/2013  FINDINGS: Cardiac shadow is mildly enlarged. The lungs are well aerated without focal pneumothorax. Fractures of the third, fourth, fifth and seventh ribs are noted on the left. Degenerative changes of the thoracic spine are noted. Chronic interstitial changes are noted bilaterally.  IMPRESSION: Multiple left rib fractures without complicating factors.   Electronically Signed   By: Inez Catalina M.D.   On: 09/04/2014 22:27   Dg Thoracic Spine 2 View  09/04/2014   CLINICAL DATA:  Recent fall down steps with upper back pain, initial encounter  EXAM: THORACIC SPINE - 2 VIEW  COMPARISON:  10/25/2012  FINDINGS: There are changes consistent with prior vertebral augmentation at L1 and L2. Stable compression deformity is noted at T8. No new compression deformities are seen at this time. Degenerative changes of the thoracic spine are noted. No paraspinal mass is seen.  IMPRESSION: Stable compression deformity at T8.  Prior vertebral  augmentation at L1 and L2.   Electronically Signed   By: Inez Catalina M.D.   On: 09/04/2014 22:28   Ct Head Wo Contrast  09/04/2014   CLINICAL DATA:  Tripped while walking downstairs; laceration at the left side of the head. Neck pain. Initial encounter.  EXAM: CT HEAD WITHOUT CONTRAST  CT CERVICAL SPINE WITHOUT CONTRAST  TECHNIQUE: Multidetector CT imaging of the head and cervical spine was performed following the standard protocol without intravenous contrast. Multiplanar CT image reconstructions of the cervical spine were also generated.  COMPARISON:  CT of the head performed 06/10/2014, and CT of the cervical spine performed 10/15/2013  FINDINGS: CT HEAD FINDINGS  There is no evidence of acute infarction, mass lesion, or intra- or extra-axial hemorrhage on CT.  Prominence of the ventricles and sulci reflects mild cortical volume loss. Mild cerebellar atrophy is noted. Mild periventricular and subcortical white matter change likely reflects small vessel ischemic microangiopathy.  The brainstem and fourth ventricle are within normal limits. The basal ganglia are unremarkable in appearance. The cerebral hemispheres demonstrate grossly normal gray-white differentiation. No mass effect or midline shift is seen.  There is no evidence of fracture; visualized osseous structures are unremarkable in appearance. The orbits are within normal limits. The paranasal sinuses and mastoid air cells are well-aerated. Soft tissue swelling and laceration are noted overlying the high left parietal calvarium, both anteriorly and posteriorly.  CT CERVICAL SPINE FINDINGS  There is no evidence of acute fracture or subluxation. There is an underlying chronic fracture through the dens, unchanged from prior studies, with associated degenerative change. Vertebral bodies demonstrate normal height and alignment. Intervertebral disc spaces are preserved. Multilevel anterior and posterior disc osteophyte complexes are seen along the cervical  spine. Prevertebral soft tissues are within normal limits.  A 1.4 cm hypodensity is noted at the left thyroid lobe. Mild scarring is noted at the lung apices. Mild calcification is seen at the carotid bifurcations bilaterally.  IMPRESSION: 1. No evidence of traumatic intracranial injury or fracture. 2. No evidence of acute fracture or subluxation along the cervical spine. Chronic fracture through the dens is unchanged in appearance, with underlying degenerative change. 3. Soft tissue swelling and laceration overlying the high left parietal calvarium, both anteriorly and posteriorly. 4. Mild cortical volume loss and scattered small vessel ischemic microangiopathy. 5. Mild degenerative change noted along the cervical spine. 6. Mild scarring at the lung apices. 7. Mild calcification at the carotid bifurcations bilaterally. Carotid ultrasound could be considered for  further evaluation, when and as deemed clinically appropriate. 8. 1.4 cm hypodensity at the left thyroid lobe. Consider further evaluation with thyroid ultrasound. If patient is clinically hyperthyroid, consider nuclear medicine thyroid uptake and scan.   Electronically Signed   By: Garald Balding M.D.   On: 09/04/2014 22:54   Ct Cervical Spine Wo Contrast  09/04/2014   CLINICAL DATA:  Tripped while walking downstairs; laceration at the left side of the head. Neck pain. Initial encounter.  EXAM: CT HEAD WITHOUT CONTRAST  CT CERVICAL SPINE WITHOUT CONTRAST  TECHNIQUE: Multidetector CT imaging of the head and cervical spine was performed following the standard protocol without intravenous contrast. Multiplanar CT image reconstructions of the cervical spine were also generated.  COMPARISON:  CT of the head performed 06/10/2014, and CT of the cervical spine performed 10/15/2013  FINDINGS: CT HEAD FINDINGS  There is no evidence of acute infarction, mass lesion, or intra- or extra-axial hemorrhage on CT.  Prominence of the ventricles and sulci reflects mild  cortical volume loss. Mild cerebellar atrophy is noted. Mild periventricular and subcortical white matter change likely reflects small vessel ischemic microangiopathy.  The brainstem and fourth ventricle are within normal limits. The basal ganglia are unremarkable in appearance. The cerebral hemispheres demonstrate grossly normal gray-white differentiation. No mass effect or midline shift is seen.  There is no evidence of fracture; visualized osseous structures are unremarkable in appearance. The orbits are within normal limits. The paranasal sinuses and mastoid air cells are well-aerated. Soft tissue swelling and laceration are noted overlying the high left parietal calvarium, both anteriorly and posteriorly.  CT CERVICAL SPINE FINDINGS  There is no evidence of acute fracture or subluxation. There is an underlying chronic fracture through the dens, unchanged from prior studies, with associated degenerative change. Vertebral bodies demonstrate normal height and alignment. Intervertebral disc spaces are preserved. Multilevel anterior and posterior disc osteophyte complexes are seen along the cervical spine. Prevertebral soft tissues are within normal limits.  A 1.4 cm hypodensity is noted at the left thyroid lobe. Mild scarring is noted at the lung apices. Mild calcification is seen at the carotid bifurcations bilaterally.  IMPRESSION: 1. No evidence of traumatic intracranial injury or fracture. 2. No evidence of acute fracture or subluxation along the cervical spine. Chronic fracture through the dens is unchanged in appearance, with underlying degenerative change. 3. Soft tissue swelling and laceration overlying the high left parietal calvarium, both anteriorly and posteriorly. 4. Mild cortical volume loss and scattered small vessel ischemic microangiopathy. 5. Mild degenerative change noted along the cervical spine. 6. Mild scarring at the lung apices. 7. Mild calcification at the carotid bifurcations bilaterally.  Carotid ultrasound could be considered for further evaluation, when and as deemed clinically appropriate. 8. 1.4 cm hypodensity at the left thyroid lobe. Consider further evaluation with thyroid ultrasound. If patient is clinically hyperthyroid, consider nuclear medicine thyroid uptake and scan.   Electronically Signed   By: Garald Balding M.D.   On: 09/04/2014 22:54   Dg Shoulder Left  09/04/2014   CLINICAL DATA:  [place] down steps this evening with left shoulder pain, initial encounter  EXAM: LEFT SHOULDER - 2+ VIEW  COMPARISON:  None.  FINDINGS: There fractures of the third, fourth, fifth and seventh ribs on the left predominately posteriorly. No fracture or dislocation of the humeral head is seen. Postsurgical changes in the left axilla are noted.  IMPRESSION: Multiple left rib fractures   Electronically Signed   By: Inez Catalina M.D.   On:  09/04/2014 22:26   Dg Humerus Left  09/04/2014   CLINICAL DATA:  Fall down steps this evening with left-sided arm pain, initial encounter  EXAM: LEFT HUMERUS - 2+ VIEW  COMPARISON:  None.  FINDINGS: No humeral fracture is noted. Soft tissue swelling is noted in the mid upper arm related to the recent injury. Multiple left rib fractures are noted.  IMPRESSION: Multiple left rib fractures.  No humeral fracture is noted.   Electronically Signed   By: Inez Catalina M.D.   On: 09/04/2014 22:30     EKG Interpretation   Date/Time:  Friday September 05 2014 00:18:39 EST Ventricular Rate:  80 PR Interval:  316 QRS Duration: 114 QT Interval:  481 QTC Calculation: 555 R Axis:   33 Text Interpretation:  Sinus rhythm Atrial premature complexes Prolonged PR  interval Borderline intraventricular conduction delay RSR' in V1 or V2,  right VCD or RVH Nonspecific T abnrm, anterolateral leads Prolonged QT  interval Confirmed by [facility]  MD, TREY ([id number]) on 09/05/2014 12:47:33 AM      MDM   Final diagnoses:  Fall down stairs  Rib fractures, left, closed, initial encounter   Scalp laceration, initial encounter    He-year-old female who fell on the stairs. She does remember the exact details of the event. She complains of left chest pain primarily. She also has a laceration on her head which was repaired with staples. CT head and cervical spine without acute injuries. Chest x-ray demonstrated multiple rib fractures without apparent complication. She remained stable regarding her ED course. However, due to elderly age, multiple rib fractures, pain, plan to admit to internal medicine.    Houston Siren III, MD 09/06/14 [phone]

## 2014-09-05 ENCOUNTER — Encounter (HOSPITAL_COMMUNITY): Payer: Self-pay | Admitting: *Deleted

## 2014-09-05 DIAGNOSIS — S2239XA Fracture of one rib, unspecified side, initial encounter for closed fracture: Secondary | ICD-10-CM | POA: Diagnosis present

## 2014-09-05 LAB — COMPREHENSIVE METABOLIC PANEL
ALBUMIN: 3.6 g/dL (ref 3.5–5.2)
ALT: 29 U/L (ref 0–35)
AST: 58 U/L — ABNORMAL HIGH (ref 0–37)
Alkaline Phosphatase: 58 U/L (ref 39–117)
Anion gap: 6 (ref 5–15)
BILIRUBIN TOTAL: 1.1 mg/dL (ref 0.3–1.2)
BUN: 18 mg/dL (ref 6–23)
CALCIUM: 8.5 mg/dL (ref 8.4–10.5)
CHLORIDE: 101 mmol/L (ref 96–112)
CO2: 28 mmol/L (ref 19–32)
Creatinine, Ser: 0.72 mg/dL (ref 0.50–1.10)
GFR calc Af Amer: >90 mL/min (ref >90)
GFR calc non Af Amer: 79 mL/min — ABNORMAL LOW (ref >90)
Glucose, Bld: 124 mg/dL — ABNORMAL HIGH (ref 70–99)
Potassium: 4.2 mmol/L (ref 3.5–5.1)
Sodium: 135 mmol/L (ref 135–145)
Total Protein: 6.5 g/dL (ref 6.0–8.3)

## 2014-09-05 LAB — CBC
HCT: 36.1 % (ref 36.0–46.0)
Hemoglobin: 11.9 g/dL — ABNORMAL LOW (ref 12.0–15.0)
MCH: 31.1 pg (ref 26.0–34.0)
MCHC: 33 g/dL (ref 30.0–36.0)
MCV: 94.3 fL (ref 78.0–100.0)
Platelets: 187 10*3/uL (ref 150–400)
RBC: 3.83 MIL/uL — ABNORMAL LOW (ref 3.87–5.11)
RDW: 13.2 % (ref 11.5–15.5)
WBC: 8.5 10*3/uL (ref 4.0–10.5)

## 2014-09-05 LAB — URINALYSIS, ROUTINE W REFLEX MICROSCOPIC
Bilirubin Urine: NEGATIVE
GLUCOSE, UA: NEGATIVE mg/dL
Hgb urine dipstick: NEGATIVE
KETONES UR: NEGATIVE mg/dL
LEUKOCYTES UA: NEGATIVE
NITRITE: NEGATIVE
PH: 5.5 (ref 5.0–8.0)
Protein, ur: NEGATIVE mg/dL
Specific Gravity, Urine: 1.018 (ref 1.005–1.030)
Urobilinogen, UA: 0.2 mg/dL (ref 0.0–1.0)

## 2014-09-05 LAB — CK
CK TOTAL: 1463 U/L — AB (ref 7–177)
Total CK: 1205 U/L — ABNORMAL HIGH (ref 7–177)

## 2014-09-05 MED ORDER — HYDROCODONE-ACETAMINOPHEN 5-325 MG PO TABS
1.0000 | ORAL_TABLET | ORAL | Status: DC | PRN
  Administered 2014-09-05: 2 via ORAL
  Administered 2014-09-05: 1 via ORAL
  Filled 2014-09-05: qty 1
  Filled 2014-09-05: qty 2

## 2014-09-05 MED ORDER — HYDROCODONE-ACETAMINOPHEN 5-325 MG PO TABS: 1.0000 | ORAL_TABLET | ORAL | Status: DC | PRN

## 2014-09-05 MED ORDER — MORPHINE SULFATE 4 MG/ML IJ SOLN: 4.0000 mg | INTRAMUSCULAR | Status: DC | PRN

## 2014-09-05 MED ORDER — SODIUM CHLORIDE 0.9 % IV SOLN: INTRAVENOUS | Status: DC

## 2014-09-05 MED ORDER — ONDANSETRON HCL 4 MG/2ML IJ SOLN: 4.0000 mg | Freq: Three times a day (TID) | INTRAMUSCULAR | Status: DC | PRN

## 2014-09-05 MED ORDER — SODIUM CHLORIDE 0.9 % IV SOLN
1000.0000 mL | Freq: Once | INTRAVENOUS | Status: AC
  Administered 2014-09-05: 1000 mL via INTRAVENOUS

## 2014-09-05 MED ORDER — ACETAMINOPHEN 325 MG PO TABS
650.0000 mg | ORAL_TABLET | Freq: Four times a day (QID) | ORAL | Status: DC | PRN
  Administered 2014-09-05: 650 mg via ORAL
  Filled 2014-09-05: qty 2

## 2014-09-05 MED ORDER — SODIUM CHLORIDE 0.9 % IV SOLN
1000.0000 mL | INTRAVENOUS | Status: DC
  Administered 2014-09-05: 1000 mL via INTRAVENOUS

## 2014-09-05 MED ORDER — ENOXAPARIN SODIUM 30 MG/0.3ML ~~LOC~~ SOLN
30.0000 mg | Freq: Every day | SUBCUTANEOUS | Status: DC
  Administered 2014-09-05: 30 mg via SUBCUTANEOUS
  Filled 2014-09-05: qty 0.3

## 2014-09-05 NOTE — Progress Notes (Signed)
OT Cancellation Note  Patient Details Name: [name] MRN: [id number] DOB: May 21, 1932   Cancelled Treatment:    Reason Eval/Treat Not Completed: Other (comment). Pt does not feel she needs OT. She will go home with daughter and has 3:1 commode and AE from a stay at [facility]. She has a small walk in shower, and I recommended that she place this inside of shower stall to sit on.  Daughter will wipe the legs off.    Igor Bishop 09/05/2014, 3:30 PM  Lesle Chris, OTR/L [phone] 09/05/2014

## 2014-09-05 NOTE — H&P (Signed)
PCP:   Geoffery Lyons, MD   Chief Complaint:  fell  HPI: Patient in good health and presents after falling down basement stairs.  Claims to have tripped carring books up the stairs.  Hit head and ribs.  No loc and denies sob, dizziness, n/v, cp prior to fall.  In er small scalp- laceration gound-staples placed and some rib fractures. Prior to this good pos, independent and well.  No alcohol.  Past Medical History: Past Medical History  Diagnosis Date  . Hypertension   . High cholesterol   . Complication of anesthesia 04/18/2012    "didn't tolerate it today very well; had the shakes and very hard time w/it"  . Arthritis     "in my back"  . Osteoporosis   . Pneumonia     hx of pneumonia- 1971  . Peripheral vascular disease     hx of ligation   . Breast cancer 1996    left breast cancer   . Depression 11/20/2012  . History of alcoholism     7 1/2 years clean   Past Surgical History  Procedure Laterality Date  . Knee arthroscopy  04/17/2012    Procedure: ARTHROSCOPY KNEE;  Surgeon: Rudean Haskell, MD;  Location: [facility];  Service: Orthopedics;  Laterality: Right;  . I&d extremity  04/17/2012    Procedure: IRRIGATION AND DEBRIDEMENT EXTREMITY;  Surgeon: Rudean Haskell, MD;  Location: [place];  Service: Orthopedics;  Laterality: Right;  . Tonsillectomy and adenoidectomy      "I was a child"  . Posterior laminectomy / decompression lumbar spine  1980's  . Breast lumpectomy Left 1996  . Menisectomy Right 04/11/2012  . Rotator cuff surgery  Right   . Vein ligation    . Total knee arthroplasty Right 10/29/2012    Procedure: RIGHT TOTAL KNEE ARTHROPLASTY;  Surgeon: Gearlean Alf, MD;  Location: [facility];  Service: Orthopedics;  Laterality: Right;  . Kyphoplasty  2009, 2013    thorasic, lumbar    Medications: Prior to Admission medications   Medication Sig Start Date End Date Taking? Authorizing Provider  alendronate (FOSAMAX) 70 MG tablet Take 70 mg by mouth once a week. Every  Sunday. 08/21/13  Yes Historical Provider, MD  aspirin 81 MG tablet Take 81 mg by mouth daily.   Yes Historical Provider, MD  Cholecalciferol (VITAMIN D-3) 5000 UNITS TABS Take 1 tablet by mouth daily.   Yes Historical Provider, MD  escitalopram (LEXAPRO) 10 MG tablet Take 10 mg by mouth every morning.    Yes Historical Provider, MD  Multiple Vitamin (MULTIVITAMIN) tablet Take 1 tablet by mouth daily.   Yes Historical Provider, MD  Naproxen Sodium (ALEVE) 220 MG CAPS Take 2 capsules by mouth daily.    Yes Historical Provider, MD  triamterene-hydrochlorothiazide (MAXZIDE) 75-50 MG per tablet Take 1 tablet by mouth daily before breakfast.    Yes Historical Provider, MD    Allergies:   Allergies  Allergen Reactions  . Oysters [Shellfish Allergy] Nausea And Vomiting  . Codeine Nausea And Vomiting    Social History:  reports that she has never smoked. She has never used smokeless tobacco. She reports that she does not drink alcohol or use illicit drugs.  Family History: Family History  Problem Relation Age of Onset  . Kidney failure Mother   . Anuerysm Father     AAA    Physical Exam: Filed Vitals:   09/05/14 4259 09/05/14 0405 09/05/14 0500 09/05/14 0623  BP: 112/55  128/66  Pulse: 74   87  Temp: 98.2 F (36.8 C)   98.1 F (36.7 C)  TempSrc: Oral   Oral  Resp: 19   18  Height:  5\' 6"  (1.676 m)    Weight:  70.308 kg (155 lb)    SpO2: 95%  95% 97%   General appearance: alert, cooperative and no distress- looks good- bright Head: Normocephalic, without obvious abnormality, atraumatic, staples left temporal Eyes: conjunctivae/corneas clear. PERRL, EOM's intact.  Nose: Nares normal. Septum midline. Mucosa normal. No drainage or sinus tenderness. Throat: lips, mucosa, and tongue normal; teeth and gums normal Neck: no adenopathy, no carotid bruit, no JVD and thyroid not enlarged, symmetric, no tenderness/mass/nodules Resp: clear to auscultation bilaterally Cardio: regular rate  and rhythm, S1, S2 normal, no murmur, click, rub or gallop GI: soft, non-tender; bowel sounds normal; no masses,  no organomegaly Extremities: extremities normal, atraumatic, no cyanosis or edema Pulses: 2+ and symmetric Lymph nodes: Cervical adenopathy: no cervical lymphadenopathy Neurologic: Alert and oriented X 3, normal strength and tone. Normal symmetric reflexes.     Labs on Admission:  No results for input(s): NA, K, CL, CO2, GLUCOSE, BUN, CREATININE, CALCIUM, MG, PHOS in the last 72 hours. No results for input(s): AST, ALT, ALKPHOS, BILITOT, PROT, ALBUMIN in the last 72 hours. No results for input(s): LIPASE, AMYLASE in the last 72 hours.  Recent Labs  09/05/14 0730  WBC 8.5  HGB 11.9*  HCT 36.1  MCV 94.3  PLT 187    Recent Labs  09/05/14 0134  CKTOTAL 1205*   No results for input(s): TSH, T4TOTAL, T3FREE, THYROIDAB in the last 72 hours.  Invalid input(s): FREET3 No results for input(s): VITAMINB12, FOLATE, FERRITIN, TIBC, IRON, RETICCTPCT in the last 72 hours.  Radiological Exams on Admission: Dg Chest 1 View  09/04/2014   CLINICAL DATA:  Recent fall bowel steps with left-sided chest pain  EXAM: CHEST  1 VIEW  COMPARISON:  04/02/2013  FINDINGS: Cardiac shadow is mildly enlarged. The lungs are well aerated without focal pneumothorax. Fractures of the third, fourth, fifth and seventh ribs are noted on the left. Degenerative changes of the thoracic spine are noted. Chronic interstitial changes are noted bilaterally.  IMPRESSION: Multiple left rib fractures without complicating factors.   Electronically Signed   By: Inez Catalina M.D.   On: 09/04/2014 22:27   Dg Thoracic Spine 2 View  09/04/2014   CLINICAL DATA:  Recent fall down steps with upper back pain, initial encounter  EXAM: THORACIC SPINE - 2 VIEW  COMPARISON:  10/25/2012  FINDINGS: There are changes consistent with prior vertebral augmentation at L1 and L2. Stable compression deformity is noted at T8. No new  compression deformities are seen at this time. Degenerative changes of the thoracic spine are noted. No paraspinal mass is seen.  IMPRESSION: Stable compression deformity at T8.  Prior vertebral augmentation at L1 and L2.   Electronically Signed   By: Inez Catalina M.D.   On: 09/04/2014 22:28   Ct Head Wo Contrast  09/04/2014   CLINICAL DATA:  Tripped while walking downstairs; laceration at the left side of the head. Neck pain. Initial encounter.  EXAM: CT HEAD WITHOUT CONTRAST  CT CERVICAL SPINE WITHOUT CONTRAST  TECHNIQUE: Multidetector CT imaging of the head and cervical spine was performed following the standard protocol without intravenous contrast. Multiplanar CT image reconstructions of the cervical spine were also generated.  COMPARISON:  CT of the head performed 06/10/2014, and CT of the cervical  spine performed 10/15/2013  FINDINGS: CT HEAD FINDINGS  There is no evidence of acute infarction, mass lesion, or intra- or extra-axial hemorrhage on CT.  Prominence of the ventricles and sulci reflects mild cortical volume loss. Mild cerebellar atrophy is noted. Mild periventricular and subcortical white matter change likely reflects small vessel ischemic microangiopathy.  The brainstem and fourth ventricle are within normal limits. The basal ganglia are unremarkable in appearance. The cerebral hemispheres demonstrate grossly normal gray-white differentiation. No mass effect or midline shift is seen.  There is no evidence of fracture; visualized osseous structures are unremarkable in appearance. The orbits are within normal limits. The paranasal sinuses and mastoid air cells are well-aerated. Soft tissue swelling and laceration are noted overlying the high left parietal calvarium, both anteriorly and posteriorly.  CT CERVICAL SPINE FINDINGS  There is no evidence of acute fracture or subluxation. There is an underlying chronic fracture through the dens, unchanged from prior studies, with associated degenerative  change. Vertebral bodies demonstrate normal height and alignment. Intervertebral disc spaces are preserved. Multilevel anterior and posterior disc osteophyte complexes are seen along the cervical spine. Prevertebral soft tissues are within normal limits.  A 1.4 cm hypodensity is noted at the left thyroid lobe. Mild scarring is noted at the lung apices. Mild calcification is seen at the carotid bifurcations bilaterally.  IMPRESSION: 1. No evidence of traumatic intracranial injury or fracture. 2. No evidence of acute fracture or subluxation along the cervical spine. Chronic fracture through the dens is unchanged in appearance, with underlying degenerative change. 3. Soft tissue swelling and laceration overlying the high left parietal calvarium, both anteriorly and posteriorly. 4. Mild cortical volume loss and scattered small vessel ischemic microangiopathy. 5. Mild degenerative change noted along the cervical spine. 6. Mild scarring at the lung apices. 7. Mild calcification at the carotid bifurcations bilaterally. Carotid ultrasound could be considered for further evaluation, when and as deemed clinically appropriate. 8. 1.4 cm hypodensity at the left thyroid lobe. Consider further evaluation with thyroid ultrasound. If patient is clinically hyperthyroid, consider nuclear medicine thyroid uptake and scan.   Electronically Signed   By: Garald Balding M.D.   On: 09/04/2014 22:54   Ct Cervical Spine Wo Contrast  09/04/2014   CLINICAL DATA:  Tripped while walking downstairs; laceration at the left side of the head. Neck pain. Initial encounter.  EXAM: CT HEAD WITHOUT CONTRAST  CT CERVICAL SPINE WITHOUT CONTRAST  TECHNIQUE: Multidetector CT imaging of the head and cervical spine was performed following the standard protocol without intravenous contrast. Multiplanar CT image reconstructions of the cervical spine were also generated.  COMPARISON:  CT of the head performed 06/10/2014, and CT of the cervical spine performed  10/15/2013  FINDINGS: CT HEAD FINDINGS  There is no evidence of acute infarction, mass lesion, or intra- or extra-axial hemorrhage on CT.  Prominence of the ventricles and sulci reflects mild cortical volume loss. Mild cerebellar atrophy is noted. Mild periventricular and subcortical white matter change likely reflects small vessel ischemic microangiopathy.  The brainstem and fourth ventricle are within normal limits. The basal ganglia are unremarkable in appearance. The cerebral hemispheres demonstrate grossly normal gray-white differentiation. No mass effect or midline shift is seen.  There is no evidence of fracture; visualized osseous structures are unremarkable in appearance. The orbits are within normal limits. The paranasal sinuses and mastoid air cells are well-aerated. Soft tissue swelling and laceration are noted overlying the high left parietal calvarium, both anteriorly and posteriorly.  CT CERVICAL SPINE FINDINGS  There is no evidence of acute fracture or subluxation. There is an underlying chronic fracture through the dens, unchanged from prior studies, with associated degenerative change. Vertebral bodies demonstrate normal height and alignment. Intervertebral disc spaces are preserved. Multilevel anterior and posterior disc osteophyte complexes are seen along the cervical spine. Prevertebral soft tissues are within normal limits.  A 1.4 cm hypodensity is noted at the left thyroid lobe. Mild scarring is noted at the lung apices. Mild calcification is seen at the carotid bifurcations bilaterally.  IMPRESSION: 1. No evidence of traumatic intracranial injury or fracture. 2. No evidence of acute fracture or subluxation along the cervical spine. Chronic fracture through the dens is unchanged in appearance, with underlying degenerative change. 3. Soft tissue swelling and laceration overlying the high left parietal calvarium, both anteriorly and posteriorly. 4. Mild cortical volume loss and scattered small  vessel ischemic microangiopathy. 5. Mild degenerative change noted along the cervical spine. 6. Mild scarring at the lung apices. 7. Mild calcification at the carotid bifurcations bilaterally. Carotid ultrasound could be considered for further evaluation, when and as deemed clinically appropriate. 8. 1.4 cm hypodensity at the left thyroid lobe. Consider further evaluation with thyroid ultrasound. If patient is clinically hyperthyroid, consider nuclear medicine thyroid uptake and scan.   Electronically Signed   By: Garald Balding M.D.   On: 09/04/2014 22:54   Dg Shoulder Left  09/04/2014   CLINICAL DATA:  [place] down steps this evening with left shoulder pain, initial encounter  EXAM: LEFT SHOULDER - 2+ VIEW  COMPARISON:  None.  FINDINGS: There fractures of the third, fourth, fifth and seventh ribs on the left predominately posteriorly. No fracture or dislocation of the humeral head is seen. Postsurgical changes in the left axilla are noted.  IMPRESSION: Multiple left rib fractures   Electronically Signed   By: Inez Catalina M.D.   On: 09/04/2014 22:26   Dg Humerus Left  09/04/2014   CLINICAL DATA:  Fall down steps this evening with left-sided arm pain, initial encounter  EXAM: LEFT HUMERUS - 2+ VIEW  COMPARISON:  None.  FINDINGS: No humeral fracture is noted. Soft tissue swelling is noted in the mid upper arm related to the recent injury. Multiple left rib fractures are noted.  IMPRESSION: Multiple left rib fractures.  No humeral fracture is noted.   Electronically Signed   By: Inez Catalina M.D.   On: 09/04/2014 22:30   Orders placed or performed during the hospital encounter of 09/04/14  . EKG 12-Lead  . EKG 12-Lead    Assessment/Plan Active Problems:   Rib fracture   Scalp laceration   htn   Depression  Mobilize and home  Jaileigh Weimer A 09/05/2014, 8:44 AM

## 2014-09-05 NOTE — Progress Notes (Signed)
Patient states that she has not voided since MN.   Rn helped patient ambulate to BR at 0930am without results.   PT also helped patient ambulate to bathroom after ambulating in hallway. Patient was not able to void at this time.   Rn bladder scanned patient at 1100am and the greatest amount of urine revealed by the bladder scanner was 177mL.   RN encouraged fluids by giving patient water, and a full cup of coffee.   MD called at [phone] about patients uncontrolled pain, difficulty urinating, and ambulating status.   MD assistant, Danae Chen called back for Dr. Reynaldo Minium. She gave a telephone order for pain medication, encouraged mobility, and wanted another update called to the office this afternoon.

## 2014-09-05 NOTE — Evaluation (Signed)
Physical Therapy Evaluation Patient Details Name: [name] MRN: [id number] DOB: 06/11/1932 Today's Date: 09/05/2014   History of Present Illness  Pt is an 81 year old female s/p fall on stairs at home and admitted for small scalp laceration and L rib fractures.  Clinical Impression  Pt admitted with above diagnosis. Pt currently with functional limitations due to the deficits listed below (see PT Problem List).  Pt will benefit from skilled PT to increase their independence and safety with mobility to allow discharge to the venue listed below.  Pt able to ambulate however mobility is slow at this time due to rib fractures.  Pt has RW however family to check for [facility] to assist with toilet transfers.  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  Pt reports her spouse and daughter will be able to assist her upon d/c.  Will continue to assist with mobility if pt remains and is not ambulating with nursing staff.     Follow Up Recommendations No PT follow up    Equipment Recommendations  3in1 (PT) (pt may have 3in1 however was unsure, family to check)    Recommendations for Other Services       Precautions / Restrictions        Mobility  Bed Mobility Overal bed mobility: Needs Assistance Bed Mobility: Supine to Sit;Sit to Supine     Supine to sit: Supervision Sit to supine: Supervision   General bed mobility comments: did not require assist however increased time due to L flank pain (rib fxs), encouraged rolling to R then sitting upright for more comfort  Transfers Overall transfer level: Needs assistance Equipment used: Rolling walker (2 wheeled) Transfers: Sit to/from Stand Sit to Stand: Supervision         General transfer comment: uses safe technique  Ambulation/Gait Ambulation/Gait assistance: Min guard;Supervision Ambulation Distance (Feet): 140 Feet Assistive device: Rolling walker (2 wheeled) Gait Pattern/deviations: Step-through pattern;Trunk flexed     General Gait Details: slow pace however pt with L rib pain during mobility also therefore causing flexed posture, encouraged upright posture however pt in too much pain (RN notified)  Stairs            Wheelchair Mobility    Modified Rankin (Stroke Patients Only)       Balance                                             Pertinent Vitals/Pain Pain Assessment: 0-10 Pain Score: 7  Pain Location: L flank Pain Descriptors / Indicators: Aching;Discomfort Pain Intervention(s): Limited activity within patient's tolerance;Monitored during session;Repositioned;Patient requesting pain meds-RN notified    Home Living Family/patient expects to be discharged to:: Private residence Living Arrangements: Spouse/significant other   Type of Home: House   Entrance Stairs-Rails: Left Entrance Stairs-Number of Steps: 3 Home Layout: Multi-level;Able to live on main level with bedroom/bathroom Home Equipment: Gilford Rile - 2 wheels;Cane - single point Additional Comments: fell trying to carry books upstairs from basement, also has second story with extra bedrooms    Prior Function Level of Independence: Independent         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        Extremity/Trunk Assessment               Lower Extremity Assessment: Overall WFL for tasks  assessed         Communication   Communication: No difficulties  Cognition Arousal/Alertness: Awake/alert Behavior During Therapy: WFL for tasks assessed/performed Overall Cognitive Status: Within Functional Limits for tasks assessed                      General Comments      Exercises        Assessment/Plan    PT Assessment Patient needs continued PT services  PT Diagnosis Difficulty walking;Acute pain   PT Problem List Decreased mobility;Pain  PT Treatment Interventions DME instruction;Gait training;Functional mobility training;Patient/family education;Therapeutic  activities;Stair training   PT Goals (Current goals can be found in the Care Plan section) Acute Rehab PT Goals PT Goal Formulation: With patient Time For Goal Achievement: 09/12/14 Potential to Achieve Goals: Good    Frequency Min 3X/week   Barriers to discharge        Co-evaluation               End of Session   Activity Tolerance: Patient limited by pain Patient left: in bed;with call bell/phone within reach Nurse Communication: Mobility status;Patient requests pain meds    Functional Assessment Tool Used: clinical judgement Functional Limitation: Mobility: Walking and moving around Mobility: Walking and Moving Around Current Status [phone]): At least 1 percent but less than 20 percent impaired, limited or restricted Mobility: Walking and Moving Around Goal Status [phone]): 0 percent impaired, limited or restricted    Time: 1036-1100 PT Time Calculation (min) (ACUTE ONLY): 24 min   Charges:   PT Evaluation $Initial PT Evaluation Tier I: 1 Procedure     PT G Codes:   PT G-Codes **NOT FOR INPATIENT CLASS** Functional Assessment Tool Used: clinical judgement Functional Limitation: Mobility: Walking and moving around Mobility: Walking and Moving Around Current Status ([id number]): At least 1 percent but less than 20 percent impaired, limited or restricted Mobility: Walking and Moving Around Goal Status [phone]): 0 percent impaired, limited or restricted    [name],KATHrine E 09/05/2014, 12:53 PM Carmelia Bake, PT, DPT 09/05/2014 Pager: [phone]

## 2014-09-05 NOTE — Progress Notes (Signed)
RN reviewed discharge education with patient and family.   All questions answered.   Paperwork given. Daughter to pick up prescriptions from MD office.   Patient wheeled down in wheelchair to family car.

## 2014-09-05 NOTE — Discharge Summary (Signed)
DISCHARGE SUMMARY  [name]  MR#: [id number]  DOB:[birth year]  Date of Admission: 09/04/2014 Date of Discharge: 09/05/2014  Attending Physician:Khalon Cansler A  Patient's TGP:QDIYMEB,RAXENMM A, MD  Consults:  none  Discharge Diagnoses: Active Problems:   Rib fracture   Discharge Medications:   Medication List    TAKE these medications        alendronate 70 MG tablet  Commonly known as:  FOSAMAX  Take 70 mg by mouth once a week. Every Sunday.     ALEVE 220 MG Caps  Generic drug:  Naproxen Sodium  Take 2 capsules by mouth daily.     aspirin 81 MG tablet  Take 81 mg by mouth daily.     escitalopram 10 MG tablet  Commonly known as:  LEXAPRO  Take 10 mg by mouth every morning.     HYDROcodone-acetaminophen 5-325 MG per tablet  Commonly known as:  NORCO/VICODIN  Take 1-2 tablets by mouth every 4 (four) hours as needed for moderate pain.     multivitamin tablet  Take 1 tablet by mouth daily.     triamterene-hydrochlorothiazide 75-50 MG per tablet  Commonly known as:  MAXZIDE  Take 1 tablet by mouth daily before breakfast.     Vitamin D-3 5000 UNITS Tabs  Take 1 tablet by mouth daily.        Hospital Procedures: Dg Chest 1 View  09/04/2014   CLINICAL DATA:  Recent fall bowel steps with left-sided chest pain  EXAM: CHEST  1 VIEW  COMPARISON:  04/02/2013  FINDINGS: Cardiac shadow is mildly enlarged. The lungs are well aerated without focal pneumothorax. Fractures of the third, fourth, fifth and seventh ribs are noted on the left. Degenerative changes of the thoracic spine are noted. Chronic interstitial changes are noted bilaterally.  IMPRESSION: Multiple left rib fractures without complicating factors.   Electronically Signed   By: Inez Catalina M.D.   On: 09/04/2014 22:27   Dg Thoracic Spine 2 View  09/04/2014   CLINICAL DATA:  Recent fall down steps with upper back pain, initial encounter  EXAM: THORACIC SPINE - 2 VIEW  COMPARISON:  10/25/2012  FINDINGS:  There are changes consistent with prior vertebral augmentation at L1 and L2. Stable compression deformity is noted at T8. No new compression deformities are seen at this time. Degenerative changes of the thoracic spine are noted. No paraspinal mass is seen.  IMPRESSION: Stable compression deformity at T8.  Prior vertebral augmentation at L1 and L2.   Electronically Signed   By: Inez Catalina M.D.   On: 09/04/2014 22:28   Ct Head Wo Contrast  09/04/2014   CLINICAL DATA:  Tripped while walking downstairs; laceration at the left side of the head. Neck pain. Initial encounter.  EXAM: CT HEAD WITHOUT CONTRAST  CT CERVICAL SPINE WITHOUT CONTRAST  TECHNIQUE: Multidetector CT imaging of the head and cervical spine was performed following the standard protocol without intravenous contrast. Multiplanar CT image reconstructions of the cervical spine were also generated.  COMPARISON:  CT of the head performed 06/10/2014, and CT of the cervical spine performed 10/15/2013  FINDINGS: CT HEAD FINDINGS  There is no evidence of acute infarction, mass lesion, or intra- or extra-axial hemorrhage on CT.  Prominence of the ventricles and sulci reflects mild cortical volume loss. Mild cerebellar atrophy is noted. Mild periventricular and subcortical white matter change likely reflects small vessel ischemic microangiopathy.  The brainstem and fourth ventricle are within normal limits. The basal ganglia are unremarkable in appearance. The  cerebral hemispheres demonstrate grossly normal gray-white differentiation. No mass effect or midline shift is seen.  There is no evidence of fracture; visualized osseous structures are unremarkable in appearance. The orbits are within normal limits. The paranasal sinuses and mastoid air cells are well-aerated. Soft tissue swelling and laceration are noted overlying the high left parietal calvarium, both anteriorly and posteriorly.  CT CERVICAL SPINE FINDINGS  There is no evidence of acute fracture or  subluxation. There is an underlying chronic fracture through the dens, unchanged from prior studies, with associated degenerative change. Vertebral bodies demonstrate normal height and alignment. Intervertebral disc spaces are preserved. Multilevel anterior and posterior disc osteophyte complexes are seen along the cervical spine. Prevertebral soft tissues are within normal limits.  A 1.4 cm hypodensity is noted at the left thyroid lobe. Mild scarring is noted at the lung apices. Mild calcification is seen at the carotid bifurcations bilaterally.  IMPRESSION: 1. No evidence of traumatic intracranial injury or fracture. 2. No evidence of acute fracture or subluxation along the cervical spine. Chronic fracture through the dens is unchanged in appearance, with underlying degenerative change. 3. Soft tissue swelling and laceration overlying the high left parietal calvarium, both anteriorly and posteriorly. 4. Mild cortical volume loss and scattered small vessel ischemic microangiopathy. 5. Mild degenerative change noted along the cervical spine. 6. Mild scarring at the lung apices. 7. Mild calcification at the carotid bifurcations bilaterally. Carotid ultrasound could be considered for further evaluation, when and as deemed clinically appropriate. 8. 1.4 cm hypodensity at the left thyroid lobe. Consider further evaluation with thyroid ultrasound. If patient is clinically hyperthyroid, consider nuclear medicine thyroid uptake and scan.   Electronically Signed   By: Garald Balding M.D.   On: 09/04/2014 22:54   Ct Cervical Spine Wo Contrast  09/04/2014   CLINICAL DATA:  Tripped while walking downstairs; laceration at the left side of the head. Neck pain. Initial encounter.  EXAM: CT HEAD WITHOUT CONTRAST  CT CERVICAL SPINE WITHOUT CONTRAST  TECHNIQUE: Multidetector CT imaging of the head and cervical spine was performed following the standard protocol without intravenous contrast. Multiplanar CT image reconstructions of  the cervical spine were also generated.  COMPARISON:  CT of the head performed 06/10/2014, and CT of the cervical spine performed 10/15/2013  FINDINGS: CT HEAD FINDINGS  There is no evidence of acute infarction, mass lesion, or intra- or extra-axial hemorrhage on CT.  Prominence of the ventricles and sulci reflects mild cortical volume loss. Mild cerebellar atrophy is noted. Mild periventricular and subcortical white matter change likely reflects small vessel ischemic microangiopathy.  The brainstem and fourth ventricle are within normal limits. The basal ganglia are unremarkable in appearance. The cerebral hemispheres demonstrate grossly normal gray-white differentiation. No mass effect or midline shift is seen.  There is no evidence of fracture; visualized osseous structures are unremarkable in appearance. The orbits are within normal limits. The paranasal sinuses and mastoid air cells are well-aerated. Soft tissue swelling and laceration are noted overlying the high left parietal calvarium, both anteriorly and posteriorly.  CT CERVICAL SPINE FINDINGS  There is no evidence of acute fracture or subluxation. There is an underlying chronic fracture through the dens, unchanged from prior studies, with associated degenerative change. Vertebral bodies demonstrate normal height and alignment. Intervertebral disc spaces are preserved. Multilevel anterior and posterior disc osteophyte complexes are seen along the cervical spine. Prevertebral soft tissues are within normal limits.  A 1.4 cm hypodensity is noted at the left thyroid lobe. Mild scarring is  noted at the lung apices. Mild calcification is seen at the carotid bifurcations bilaterally.  IMPRESSION: 1. No evidence of traumatic intracranial injury or fracture. 2. No evidence of acute fracture or subluxation along the cervical spine. Chronic fracture through the dens is unchanged in appearance, with underlying degenerative change. 3. Soft tissue swelling and  laceration overlying the high left parietal calvarium, both anteriorly and posteriorly. 4. Mild cortical volume loss and scattered small vessel ischemic microangiopathy. 5. Mild degenerative change noted along the cervical spine. 6. Mild scarring at the lung apices. 7. Mild calcification at the carotid bifurcations bilaterally. Carotid ultrasound could be considered for further evaluation, when and as deemed clinically appropriate. 8. 1.4 cm hypodensity at the left thyroid lobe. Consider further evaluation with thyroid ultrasound. If patient is clinically hyperthyroid, consider nuclear medicine thyroid uptake and scan.   Electronically Signed   By: Garald Balding M.D.   On: 09/04/2014 22:54   Dg Shoulder Left  09/04/2014   CLINICAL DATA:  [place] down steps this evening with left shoulder pain, initial encounter  EXAM: LEFT SHOULDER - 2+ VIEW  COMPARISON:  None.  FINDINGS: There fractures of the third, fourth, fifth and seventh ribs on the left predominately posteriorly. No fracture or dislocation of the humeral head is seen. Postsurgical changes in the left axilla are noted.  IMPRESSION: Multiple left rib fractures   Electronically Signed   By: Inez Catalina M.D.   On: 09/04/2014 22:26   Dg Humerus Left  09/04/2014   CLINICAL DATA:  Fall down steps this evening with left-sided arm pain, initial encounter  EXAM: LEFT HUMERUS - 2+ VIEW  COMPARISON:  None.  FINDINGS: No humeral fracture is noted. Soft tissue swelling is noted in the mid upper arm related to the recent injury. Multiple left rib fractures are noted.  IMPRESSION: Multiple left rib fractures.  No humeral fracture is noted.   Electronically Signed   By: Inez Catalina M.D.   On: 09/04/2014 22:30    History of Present Illness:  Patient in good health and presents after falling down basement stairs. Claims to have tripped carring books up the stairs. Hit head and ribs. No loc and denies sob, dizziness, n/v, cp prior to fall. In er small scalp-  laceration gound-staples placed and some rib fractures. Prior to this good pos, independent and well. No alcohol.  Hospital Course: Ambulated--good analgesic control, voiding and eating.  Discharged.  Day of Discharge Exam BP 124/60 mmHg  Pulse 76  Temp(Src) 98 F (36.7 C) (Oral)  Resp 19  Ht 5\' 6"  (1.676 m)  Wt 70.308 kg (155 lb)  BMI 25.03 kg/m2  SpO2 96%  Physical Exam: General appearance: alert, cooperative and no distress Eyes: no scleral icterus Throat: oropharynx moist without erythema Resp: clear to auscultation bilaterally Cardio: regular rate and rhythm, S1, S2 normal, no murmur, click, rub or gallop Extremities: no clubbing, cyanosis or edema  Discharge Labs:  Recent Labs  09/05/14 0730  NA 135  K 4.2  CL 101  CO2 28  GLUCOSE 124*  BUN 18  CREATININE 0.72  CALCIUM 8.5    Recent Labs  09/05/14 0730  AST 58*  ALT 29  ALKPHOS 58  BILITOT 1.1  PROT 6.5  ALBUMIN 3.6    Recent Labs  09/05/14 0730  WBC 8.5  HGB 11.9*  HCT 36.1  MCV 94.3  PLT 187    Recent Labs  09/05/14 0134 09/05/14 0730  CKTOTAL 1205* 1463*   No results  for input(s): TSH, T4TOTAL, T3FREE, THYROIDAB in the last 72 hours.  Invalid input(s): FREET3 No results for input(s): VITAMINB12, FOLATE, FERRITIN, TIBC, IRON, RETICCTPCT in the last 72 hours.  Discharge instructions:     Discharge Instructions    Diet - low sodium heart healthy    Complete by:  As directed      Increase activity slowly    Complete by:  As directed            Disposition: home  Follow-up Appts: Follow-up with Dr. Reynaldo Minium at [facility] in 1 week.  Call for appointment.  Condition on Discharge: improved  Tests Needing Follow-up: none  Signed: Taye Cato A 09/05/2014, 3:07 PM

## 2014-09-05 NOTE — ED Provider Notes (Signed)
3:01 AM Admit. Mild elevation in CK. IVFs given. Mild BUN elevation. Admit for rib fractures and pain control. Dr Doy Mince discussed case with Dr Virgina Jock who agreed to admission   EKG Interpretation  Date/Time:  Friday September 05 2014 00:18:39 EST Ventricular Rate:  80 PR Interval:  316 QRS Duration: 114 QT Interval:  481 QTC Calculation: 555 R Axis:   33 Text Interpretation:  Sinus rhythm Atrial premature complexes Prolonged PR interval Borderline intraventricular conduction delay RSR' in V1 or V2, right VCD or RVH Nonspecific T abnrm, anterolateral leads Prolonged QT interval Confirmed by [facility]  MD, TREY ([id number]) on 09/05/2014 12:47:33 AM        BUN  Date Value Ref Range Status  06/10/2014 33* 6 - 23 mg/dL Final  11/01/2012 18 6 - 23 mg/dL Final  10/31/2012 15 6 - 23 mg/dL Final  10/30/2012 15 6 - 23 mg/dL Final   CREAT  Date Value Ref Range Status  06/13/2012 0.75 0.50 - 1.10 mg/dL Final  05/23/2012 0.59 0.50 - 1.10 mg/dL Final   CREATININE, SER  Date Value Ref Range Status  06/10/2014 1.00 0.50 - 1.10 mg/dL Final  11/01/2012 0.59 0.50 - 1.10 mg/dL Final  10/31/2012 0.60 0.50 - 1.10 mg/dL Final  10/30/2012 0.68 0.50 - 1.10 mg/dL Final       Hoy Morn, MD 09/05/14 0302

## 2014-10-19 ENCOUNTER — Emergency Department (HOSPITAL_BASED_OUTPATIENT_CLINIC_OR_DEPARTMENT_OTHER): Payer: Medicare Other

## 2014-10-19 ENCOUNTER — Observation Stay (HOSPITAL_BASED_OUTPATIENT_CLINIC_OR_DEPARTMENT_OTHER)
Admission: EM | Admit: 2014-10-19 | Discharge: 2014-10-20 | Disposition: A | Discharge status: Home / Self Care | Payer: Medicare Other | Attending: Internal Medicine | Admitting: Internal Medicine

## 2014-10-19 ENCOUNTER — Encounter (HOSPITAL_BASED_OUTPATIENT_CLINIC_OR_DEPARTMENT_OTHER): Payer: Self-pay | Admitting: *Deleted

## 2014-10-19 DIAGNOSIS — R55 Syncope and collapse: Secondary | ICD-10-CM | POA: Diagnosis not present

## 2014-10-19 DIAGNOSIS — M1710 Unilateral primary osteoarthritis, unspecified knee: Secondary | ICD-10-CM | POA: Diagnosis present

## 2014-10-19 DIAGNOSIS — F32A Depression, unspecified: Secondary | ICD-10-CM | POA: Diagnosis present

## 2014-10-19 DIAGNOSIS — I10 Essential (primary) hypertension: Secondary | ICD-10-CM | POA: Insufficient documentation

## 2014-10-19 DIAGNOSIS — R531 Weakness: Secondary | ICD-10-CM | POA: Diagnosis not present

## 2014-10-19 DIAGNOSIS — R7989 Other specified abnormal findings of blood chemistry: Secondary | ICD-10-CM | POA: Diagnosis not present

## 2014-10-19 DIAGNOSIS — M009 Pyogenic arthritis, unspecified: Secondary | ICD-10-CM | POA: Diagnosis present

## 2014-10-19 DIAGNOSIS — R0602 Shortness of breath: Secondary | ICD-10-CM

## 2014-10-19 DIAGNOSIS — E785 Hyperlipidemia, unspecified: Secondary | ICD-10-CM | POA: Diagnosis not present

## 2014-10-19 DIAGNOSIS — R0609 Other forms of dyspnea: Secondary | ICD-10-CM

## 2014-10-19 DIAGNOSIS — R0600 Dyspnea, unspecified: Principal | ICD-10-CM | POA: Insufficient documentation

## 2014-10-19 DIAGNOSIS — M179 Osteoarthritis of knee, unspecified: Secondary | ICD-10-CM | POA: Diagnosis present

## 2014-10-19 DIAGNOSIS — R778 Other specified abnormalities of plasma proteins: Secondary | ICD-10-CM

## 2014-10-19 DIAGNOSIS — I951 Orthostatic hypotension: Secondary | ICD-10-CM | POA: Diagnosis present

## 2014-10-19 DIAGNOSIS — Z853 Personal history of malignant neoplasm of breast: Secondary | ICD-10-CM

## 2014-10-19 DIAGNOSIS — F329 Major depressive disorder, single episode, unspecified: Secondary | ICD-10-CM | POA: Diagnosis present

## 2014-10-19 DIAGNOSIS — R5383 Other fatigue: Secondary | ICD-10-CM | POA: Diagnosis present

## 2014-10-19 LAB — CBC
HCT: 44.3 % (ref 36.0–46.0)
HEMATOCRIT: 41.5 % (ref 36.0–46.0)
HEMOGLOBIN: 14.7 g/dL (ref 12.0–15.0)
HEMOGLOBIN: 14.2 g/dL (ref 12.0–15.0)
MCH: 31.1 pg (ref 26.0–34.0)
MCH: 31.8 pg (ref 26.0–34.0)
MCHC: 33.2 g/dL (ref 30.0–36.0)
MCHC: 34.2 g/dL (ref 30.0–36.0)
MCV: 93.7 fL (ref 78.0–100.0)
MCV: 92.8 fL (ref 78.0–100.0)
Platelets: 232 10*3/uL (ref 150–400)
Platelets: 217 10*3/uL (ref 150–400)
RBC: 4.73 MIL/uL (ref 3.87–5.11)
RBC: 4.47 MIL/uL (ref 3.87–5.11)
RDW: 13.4 % (ref 11.5–15.5)
RDW: 13.2 % (ref 11.5–15.5)
WBC: 6.7 10*3/uL (ref 4.0–10.5)
WBC: 6.9 10*3/uL (ref 4.0–10.5)

## 2014-10-19 LAB — URINALYSIS, ROUTINE W REFLEX MICROSCOPIC
BILIRUBIN URINE: NEGATIVE
Glucose, UA: NEGATIVE mg/dL
Hgb urine dipstick: NEGATIVE
KETONES UR: NEGATIVE mg/dL
Nitrite: NEGATIVE
PH: 7.5 (ref 5.0–8.0)
PROTEIN: NEGATIVE mg/dL
SPECIFIC GRAVITY, URINE: 1.044 — AB (ref 1.005–1.030)
UROBILINOGEN UA: 1 mg/dL (ref 0.0–1.0)

## 2014-10-19 LAB — TSH: TSH: 2.38 u[IU]/mL (ref 0.350–4.500)

## 2014-10-19 LAB — TROPONIN I
TROPONIN I: 0.05 ng/mL — AB (ref <0.031)
Troponin I: 0.04 ng/mL — ABNORMAL HIGH (ref <0.031)
Troponin I: 0.05 ng/mL — ABNORMAL HIGH (ref <0.031)

## 2014-10-19 LAB — BASIC METABOLIC PANEL
ANION GAP: 11 (ref 5–15)
BUN: 23 mg/dL (ref 6–23)
CALCIUM: 10 mg/dL (ref 8.4–10.5)
CO2: 28 mmol/L (ref 19–32)
CREATININE: 0.98 mg/dL (ref 0.50–1.10)
Chloride: 101 mmol/L (ref 96–112)
GFR calc non Af Amer: 53 mL/min — ABNORMAL LOW (ref >90)
GFR, EST AFRICAN AMERICAN: 61 mL/min — AB (ref >90)
GLUCOSE: 97 mg/dL (ref 70–99)
Potassium: 3.7 mmol/L (ref 3.5–5.1)
SODIUM: 140 mmol/L (ref 135–145)

## 2014-10-19 LAB — URINE MICROSCOPIC-ADD ON

## 2014-10-19 LAB — MRSA PCR SCREENING: MRSA by PCR: NEGATIVE

## 2014-10-19 LAB — CREATININE, SERUM
Creatinine, Ser: 0.98 mg/dL (ref 0.50–1.10)
GFR calc Af Amer: 61 mL/min — ABNORMAL LOW (ref >90)
GFR calc non Af Amer: 53 mL/min — ABNORMAL LOW (ref >90)

## 2014-10-19 LAB — BRAIN NATRIURETIC PEPTIDE: B Natriuretic Peptide: 141.2 pg/mL — ABNORMAL HIGH (ref 0.0–100.0)

## 2014-10-19 LAB — D-DIMER, QUANTITATIVE (NOT AT ARMC): D DIMER QUANT: 1.2 ug{FEU}/mL — AB (ref 0.00–0.48)

## 2014-10-19 MED ORDER — CARVEDILOL 3.125 MG PO TABS
3.1250 mg | ORAL_TABLET | Freq: Two times a day (BID) | ORAL | Status: DC
  Filled 2014-10-19: qty 1

## 2014-10-19 MED ORDER — ESCITALOPRAM OXALATE 10 MG PO TABS
10.0000 mg | ORAL_TABLET | Freq: Every morning | ORAL | Status: DC
  Administered 2014-10-20: 10 mg via ORAL
  Filled 2014-10-19: qty 1

## 2014-10-19 MED ORDER — ASPIRIN EC 81 MG PO TBEC
81.0000 mg | DELAYED_RELEASE_TABLET | Freq: Every day | ORAL | Status: DC
  Administered 2014-10-20: 81 mg via ORAL
  Filled 2014-10-19: qty 1

## 2014-10-19 MED ORDER — ALUM & MAG HYDROXIDE-SIMETH 200-200-20 MG/5ML PO SUSP: 30.0000 mL | Freq: Four times a day (QID) | ORAL | Status: DC | PRN

## 2014-10-19 MED ORDER — ONDANSETRON HCL 4 MG/2ML IJ SOLN: 4.0000 mg | Freq: Four times a day (QID) | INTRAMUSCULAR | Status: DC | PRN

## 2014-10-19 MED ORDER — HYDROCODONE-ACETAMINOPHEN 5-325 MG PO TABS: 1.0000 | ORAL_TABLET | ORAL | Status: DC | PRN

## 2014-10-19 MED ORDER — IOHEXOL 350 MG/ML SOLN
100.0000 mL | Freq: Once | INTRAVENOUS | Status: AC | PRN
  Administered 2014-10-19: 100 mL via INTRAVENOUS

## 2014-10-19 MED ORDER — POLYETHYLENE GLYCOL 3350 17 G PO PACK: 17.0000 g | PACK | Freq: Every day | ORAL | Status: DC | PRN

## 2014-10-19 MED ORDER — MAGNESIUM SULFATE IN D5W 10-5 MG/ML-% IV SOLN
1.0000 g | Freq: Once | INTRAVENOUS | Status: AC
  Administered 2014-10-19: 1 g via INTRAVENOUS
  Filled 2014-10-19: qty 100

## 2014-10-19 MED ORDER — SODIUM CHLORIDE 0.9 % IV SOLN
INTRAVENOUS | Status: AC
  Administered 2014-10-19 – 2014-10-20 (×2): via INTRAVENOUS

## 2014-10-19 MED ORDER — HEPARIN (PORCINE) IN NACL 100-0.45 UNIT/ML-% IJ SOLN
1000.0000 [IU]/h | INTRAMUSCULAR | Status: DC
  Administered 2014-10-19: 1000 [IU]/h via INTRAVENOUS
  Filled 2014-10-19: qty 250

## 2014-10-19 MED ORDER — GUAIFENESIN-DM 100-10 MG/5ML PO SYRP: 5.0000 mL | ORAL_SOLUTION | ORAL | Status: DC | PRN

## 2014-10-19 MED ORDER — HEPARIN SODIUM (PORCINE) 5000 UNIT/ML IJ SOLN
5000.0000 [IU] | Freq: Three times a day (TID) | INTRAMUSCULAR | Status: DC
  Administered 2014-10-19 – 2014-10-20 (×2): 5000 [IU] via SUBCUTANEOUS
  Filled 2014-10-19 (×2): qty 1

## 2014-10-19 MED ORDER — HEPARIN BOLUS VIA INFUSION
4000.0000 [IU] | Freq: Once | INTRAVENOUS | Status: AC
  Administered 2014-10-19: 4000 [IU] via INTRAVENOUS

## 2014-10-19 MED ORDER — VITAMIN D3 25 MCG (1000 UNIT) PO TABS
1000.0000 [IU] | ORAL_TABLET | Freq: Every day | ORAL | Status: DC
  Administered 2014-10-20: 1000 [IU] via ORAL
  Filled 2014-10-19 (×2): qty 1

## 2014-10-19 MED ORDER — CEFTRIAXONE SODIUM IN DEXTROSE 20 MG/ML IV SOLN
1.0000 g | INTRAVENOUS | Status: DC
  Administered 2014-10-19: 1 g via INTRAVENOUS
  Filled 2014-10-19 (×2): qty 50

## 2014-10-19 MED ORDER — SODIUM CHLORIDE 0.9 % IJ SOLN
3.0000 mL | Freq: Two times a day (BID) | INTRAMUSCULAR | Status: DC
  Administered 2014-10-20: 3 mL via INTRAVENOUS

## 2014-10-19 MED ORDER — ONDANSETRON HCL 4 MG PO TABS: 4.0000 mg | ORAL_TABLET | Freq: Four times a day (QID) | ORAL | Status: DC | PRN

## 2014-10-19 MED ORDER — ADULT MULTIVITAMIN W/MINERALS CH
1.0000 | ORAL_TABLET | Freq: Every day | ORAL | Status: DC
  Administered 2014-10-20: 1 via ORAL
  Filled 2014-10-19 (×2): qty 1

## 2014-10-19 MED ORDER — ALENDRONATE SODIUM 70 MG PO TABS: 70.0000 mg | ORAL_TABLET | ORAL | Status: DC

## 2014-10-19 NOTE — ED Notes (Signed)
Onset SOB, worse when walking, states feels heart pounding at times, pt states to lie down improves symptoms, pt states fell 7 weeks ago and "i broke some ribs", recently traveled to [place] with family

## 2014-10-19 NOTE — Progress Notes (Signed)
Called by Dr.Linker at [facility] ER 82/F , PCP Dr.Aronson presenting to [facility] with extreme fatigue and dyspnea x 1 week Recent fall and Rib Fx and was admitted for this. positive d-dimer , CTA negative Troponin 0.04 and tele with lot of PVCs and trigeminy EDP dw Cards who recommended cycling cardiac markers and will see in FU. Etiology of symptoms unclear at this time. Accepted to tele, team [facility] admits  Pamela Polite, MD

## 2014-10-19 NOTE — Progress Notes (Signed)

## 2014-10-19 NOTE — H&P (Signed)
Patient Demographics  [name], is a 81 y.o. female  MRN: [id number]   DOB - 07/08/1932  Admit Date - 10/19/2014  Outpatient Primary MD for the patient is ARONSON,RICHARD A, MD   With History of -  Past Medical History  Diagnosis Date  . Hypertension   . High cholesterol   . Complication of anesthesia 04/18/2012    "didn't tolerate it today very well; had the shakes and very hard time w/it"  . Arthritis     "in my back"  . Osteoporosis   . Peripheral vascular disease     hx of ligation   . Breast cancer 1996    left breast cancer   . Depression 11/20/2012  . History of alcoholism     7 1/2 years clean      Past Surgical History  Procedure Laterality Date  . Knee arthroscopy  04/17/2012    Procedure: ARTHROSCOPY KNEE;  Surgeon: Rudean Haskell, MD;  Location: [place];  Service: Orthopedics;  Laterality: Right;  . I&d extremity  04/17/2012    Procedure: IRRIGATION AND DEBRIDEMENT EXTREMITY;  Surgeon: Rudean Haskell, MD;  Location: [place];  Service: Orthopedics;  Laterality: Right;  . Tonsillectomy and adenoidectomy      "I was a child"  . Posterior laminectomy / decompression lumbar spine  1980's  . Breast lumpectomy Left 1996  . Menisectomy Right 04/11/2012  . Rotator cuff surgery  Right   . Vein ligation    . Total knee arthroplasty Right 10/29/2012    Procedure: RIGHT TOTAL KNEE ARTHROPLASTY;  Surgeon: Gearlean Alf, MD;  Location: [facility];  Service: Orthopedics;  Laterality: Right;  . Kyphoplasty  2009, 2013    thorasic, lumbar    in for   Chief Complaint  Patient presents with  . Shortness of Breath     HPI  [name]  is a 81 y.o. female, history of breast cancer with left-sided lumpectomy 20 years ago, essential hypertension, dyslipidemia, PAD, remote history of alcohol abuse quit 7  1/2 years ago, who comes to the [facility] [place] [facility] with chief complaints of feeling weak all over and experiencing palpitations along with some exertional shortness of breath for the last 2-3 days. She has had 2 mechanical falls in the last 2 months but no history suggestive of syncope. According to the patient she gets palpitations when she stands up or ambulates, she also experiences some shortness of breath at that time, [facility] [place] her workup was unremarkable except PVCs on EKG and mildly elevated troponin. UA was borderline for UTI.   Her case was discussed with cardiologist on call at [facility] and she was transferred here for further care. Currently patient besides having some generalized weakness, sensation of palpitations on exertion along with exertional shortness of breath has no such symptoms. Denies any history of CAD or CHF. Denies any history of dysrhythmia. No  history of smoking. CT angiogram done has been negative.    Review of Systems    In addition to the HPI above,   No Fever-chills, No Headache, No changes with Vision or hearing, No problems swallowing food or Liquids, No Chest pain, Cough or Shortness of Breath, No Abdominal pain, No Nausea or Vommitting, Bowel movements are regular, No Blood in stool or Urine, No dysuria, No new skin rashes or bruises, No new joints pains-aches,  No new weakness, tingling, numbness in any extremity, positive generalized weakness, No recent weight gain or loss, No polyuria, polydypsia or polyphagia, No significant Mental Stressors.  A full 10 point Review of Systems was done, except as stated above, all other Review of Systems were negative.   Social History History  Substance Use Topics  . Smoking status: Never Smoker   . Smokeless tobacco: Never Used  . Alcohol Use: No     Comment: 04/18/2012 "couple drinks of vodka/day til ~ 6 yr ago; nothing in the last 6 yrs"      Family History Family History  Problem  Relation Age of Onset  . Kidney failure Mother   . Anuerysm Father     AAA      Prior to Admission medications   Medication Sig Start Date End Date Taking? Authorizing Provider  alendronate (FOSAMAX) 70 MG tablet Take 70 mg by mouth once a week. Every Sunday. 08/21/13   Historical Provider, MD  aspirin 81 MG tablet Take 81 mg by mouth daily.    Historical Provider, MD  Cholecalciferol (VITAMIN D-3) 5000 UNITS TABS Take 1 tablet by mouth daily.    Historical Provider, MD  escitalopram (LEXAPRO) 10 MG tablet Take 10 mg by mouth every morning.     Historical Provider, MD  HYDROcodone-acetaminophen (NORCO/VICODIN) 5-325 MG per tablet Take 1-2 tablets by mouth every 4 (four) hours as needed for moderate pain. 09/05/14   Burnard Bunting, MD  Multiple Vitamin (MULTIVITAMIN) tablet Take 1 tablet by mouth daily.    Historical Provider, MD  Naproxen Sodium (ALEVE) 220 MG CAPS Take 2 capsules by mouth daily.     Historical Provider, MD  triamterene-hydrochlorothiazide (MAXZIDE) 75-50 MG per tablet Take 1 tablet by mouth daily before breakfast.     Historical Provider, MD    Allergies  Allergen Reactions  . Oysters [Shellfish Allergy] Nausea And Vomiting  . Codeine Nausea And Vomiting    Physical Exam  Vitals  Blood pressure 128/75, pulse 68, temperature 98 F (36.7 C), temperature source Oral, resp. rate 16, height 5' 6.5" (1.689 m), weight 68.629 kg (151 lb 4.8 oz), SpO2 98 %.   1. General elderly white female lying in bed in NAD,     2. Normal affect and insight, Not Suicidal or Homicidal, Awake Alert, Oriented X 3.  3. No F.N deficits, ALL C.Nerves Intact, Strength 5/5 all 4 extremities, Sensation intact all 4 extremities, Plantars down going.  4. Ears and Eyes appear Normal, Conjunctivae clear, PERRLA. Moist Oral Mucosa.  5. Supple Neck, No JVD, No cervical lymphadenopathy appriciated, No Carotid Bruits.  6. Symmetrical Chest wall movement, Good air movement bilaterally, CTAB.  7.  RRR, No Gallops, Rubs or Murmurs, No Parasternal Heave.  8. Positive Bowel Sounds, Abdomen Soft, No tenderness, No organomegaly appriciated,No rebound -guarding or rigidity.  9.  No Cyanosis, Normal Skin Turgor, No Skin Rash or Bruise.  10. Good muscle tone,  joints appear normal , no effusions, Normal ROM.  11. No Palpable Lymph Nodes in  Neck or Axillae     Data Review  CBC  Recent Labs Lab 10/19/14 1115  WBC 6.7  HGB 14.7  HCT 44.3  PLT 232  MCV 93.7  MCH 31.1  MCHC 33.2  RDW 13.4   ------------------------------------------------------------------------------------------------------------------  Chemistries   Recent Labs Lab 10/19/14 1115  NA 140  K 3.7  CL 101  CO2 28  GLUCOSE 97  BUN 23  CREATININE 0.98  CALCIUM 10.0   ------------------------------------------------------------------------------------------------------------------ estimated creatinine clearance is 43.7 mL/min (by C-G formula based on Cr of 0.98). ------------------------------------------------------------------------------------------------------------------ No results for input(s): TSH, T4TOTAL, T3FREE, THYROIDAB in the last 72 hours.  Invalid input(s): FREET3   Coagulation profile No results for input(s): INR, PROTIME in the last 168 hours. -------------------------------------------------------------------------------------------------------------------  Recent Labs  10/19/14 1115  DDIMER 1.20*   -------------------------------------------------------------------------------------------------------------------  Cardiac Enzymes  Recent Labs Lab 10/19/14 1115 10/19/14 1500  TROPONINI 0.04* 0.05*   ------------------------------------------------------------------------------------------------------------------ Invalid input(s): POCBNP   ---------------------------------------------------------------------------------------------------------------  Urinalysis      Component Value Date/Time   COLORURINE YELLOW 10/19/2014 1450   APPEARANCEUR CLEAR 10/19/2014 1450   LABSPEC 1.044* 10/19/2014 1450   PHURINE 7.5 10/19/2014 1450   GLUCOSEU NEGATIVE 10/19/2014 1450   HGBUR NEGATIVE 10/19/2014 1450   BILIRUBINUR NEGATIVE 10/19/2014 1450   KETONESUR NEGATIVE 10/19/2014 1450   PROTEINUR NEGATIVE 10/19/2014 1450   UROBILINOGEN 1.0 10/19/2014 1450   NITRITE NEGATIVE 10/19/2014 1450   LEUKOCYTESUR SMALL* 10/19/2014 1450    ----------------------------------------------------------------------------------------------------------------  Imaging results:   Dg Chest 2 View  10/19/2014   CLINICAL DATA:  Dizziness, palpitations starting Wednesday  EXAM: CHEST  2 VIEW  COMPARISON:  09/04/2014  FINDINGS: Cardiomediastinal silhouette is stable. No acute infiltrate or pleural effusion. No pulmonary edema. Surgical clips in left axilla again noted. Again noted multiple left upper healing rib fractures. Thoracic spine osteopenia. Hyperinflation again noted. Stable compression deformity mid thoracic spine. Prior vertebroplasty lower thoracic/ upper lumbar spine.  IMPRESSION: Hyperinflation. No active disease. Multiple healing left upper rib fractures again noted.   Electronically Signed   By: Lahoma Crocker M.D.   On: 10/19/2014 11:11   Ct Angio Chest Pe W/cm &/or Wo Cm  10/19/2014   CLINICAL DATA:  Patient status post fall 7 weeks prior. Multiple rib fractures. Shortness of breath. Elevated D-dimer. Prior left breast lumpectomy.  EXAM: CT ANGIOGRAPHY CHEST WITH CONTRAST  TECHNIQUE: Multidetector CT imaging of the chest was performed using the standard protocol during bolus administration of intravenous contrast. Multiplanar CT image reconstructions and MIPs were obtained to evaluate the vascular anatomy.  CONTRAST:  171mL OMNIPAQUE IOHEXOL 350 MG/ML SOLN  COMPARISON:  Chest CT 10/25/2012  FINDINGS: Adequate opacification of the main pulmonary artery. No evidence for pulmonary  embolism.  Mediastinum/Nodes: Heart is mildly enlarged. Trace pericardial fluid. No enlarged axillary or mediastinal adenopathy. Postsurgical changes left axilla.  Lungs/Pleura: Central airways are patent. Subpleural ground-glass and consolidative opacities within the bilateral lower lobes. No large consolidative pulmonary opacities. Trace pleural fluid. Biapical pleural parenchymal thickening.  Upper abdomen: Unremarkable  Musculoskeletal: Multiple minimally displaced left lateral and posterior rib fractures are demonstrated involving the third through eleventh ribs. Age indeterminate posterior right ninth rib fracture. Re- demonstrated kyphoplasty material within the L1 and L2 vertebral bodies.  Review of the MIP images confirms the above findings.  IMPRESSION: No evidence for pulmonary embolism.  Multiple left-sided rib fractures.   Electronically Signed   By: Lovey Newcomer M.D.   On: 10/19/2014 13:37    My personal review of EKG: Rhythm  NSR, few PVCs , no Acute ST changes    Assessment & Plan   1. Exertional shortness of breath and palpitations. Could be mild dehydration, however mild elevation of troponin cannot be explained. She will be admitted on telemetry bed, will cycle troponin, will check orthostatics, gently hydrate, hold home dose diuretic, place her on aspirin and beta blocker. Check TSH. Check echogram. Cardiology has been consulted. EKG nonacute she is chest pain-free.   2. Questionable mild UTI. Place on Rocephin.   3. Essential hypertension. For now Coreg and monitor.   4. Recent mechanical fall 2 with left-sided rib fracture 8 weeks ago. Generalized weakness. PT eval and monitor. Supportive care for now.   5. Depression. Continue Lexapro.    DVT Prophylaxis Heparin    AM Labs Ordered, also please review Full Orders  Family Communication: Admission, patients condition and plan of care including tests being ordered have been discussed with the patient and daughetr who  indicate understanding and agree with the plan and Code Status.  Code Status Full  Likely DC to  Home  Condition GUARDED     Time spent in minutes : 35    Ramyah Pankowski K M.D on 10/19/2014 at 5:55 PM  Between 7am to 7pm - Pager - [phone]  After 7pm go to [email or web address] - password [facility]  [facility]  Office  [phone]

## 2014-10-19 NOTE — ED Notes (Signed)
To radiology via stretcher, sr x 2 up

## 2014-10-19 NOTE — Consult Note (Signed)
CARDIOLOGY CONSULT NOTE   Patient ID: JASHANTI CLINKSCALE MRN: [id number] DOB/AGE: [birth year] 81 y.o.  Admit date: 10/19/2014  Primary Physician   ARONSON,RICHARD A, MD Primary Cardiologist : New Reason for Consultation  SOB   HPI: LENORIA NARINE is a 81 y.o. female with a history of HTN, HLD, breast cancer, hx of alcoholism now in remission who presented to [facility] with SOB and fatigue.   Patient intiially presented to [facility] with complaints of generalized weakness and fatigue associated with shortness of breath on exertion. Pt states symptoms have been ongoing for the past week. She feels that when she is up and walking her heart is pounding and skipping beats. No chest pain. She describes feeling that her arms and legs are 'like jello". She was hospitalized recently after a fall for rib fractures- she states the pain from these is improving. She also took a trip to [place] last week. Since returning home she has been lying in bed and not attending to her daily activities. No fever/chills. No cough. No leg swelling. No vomiting or diarrhea. There are no other associated systemic symptoms, there are no other alleviating or modifying factors. No history of DM or tobacco abuse.   Her troponin noted to be 0.04 and tele with lot of PVCs and trigeminy and she was transferred to [facility] for further work up.    Past Medical History  Diagnosis Date  . Hypertension   . High cholesterol   . Complication of anesthesia 04/18/2012    "didn't tolerate it today very well; had the shakes and very hard time w/it"  . Arthritis     "in my back"  . Osteoporosis   . Pneumonia     hx of pneumonia- 1971  . Peripheral vascular disease     hx of ligation   . Breast cancer 1996    left breast cancer   . Depression 11/20/2012  . History of alcoholism     7 1/2 years clean     Past Surgical History  Procedure Laterality Date  . Knee arthroscopy  04/17/2012    Procedure: ARTHROSCOPY KNEE;  Surgeon:  Rudean Haskell, MD;  Location: [place];  Service: Orthopedics;  Laterality: Right;  . I&d extremity  04/17/2012    Procedure: IRRIGATION AND DEBRIDEMENT EXTREMITY;  Surgeon: Rudean Haskell, MD;  Location: [place];  Service: Orthopedics;  Laterality: Right;  . Tonsillectomy and adenoidectomy      "I was a child"  . Posterior laminectomy / decompression lumbar spine  1980's  . Breast lumpectomy Left 1996  . Menisectomy Right 04/11/2012  . Rotator cuff surgery  Right   . Vein ligation    . Total knee arthroplasty Right 10/29/2012    Procedure: RIGHT TOTAL KNEE ARTHROPLASTY;  Surgeon: Gearlean Alf, MD;  Location: [facility];  Service: Orthopedics;  Laterality: Right;  . Kyphoplasty  2009, 2013    thorasic, lumbar    Allergies  Allergen Reactions  . Oysters [Shellfish Allergy] Nausea And Vomiting  . Codeine Nausea And Vomiting    I have reviewed the patient's current medications     Prior to Admission medications   Medication Sig Start Date End Date Taking? Authorizing Provider  alendronate (FOSAMAX) 70 MG tablet Take 70 mg by mouth once a week. Every Sunday. 08/21/13   Historical Provider, MD  aspirin 81 MG tablet Take 81 mg by mouth daily.    Historical Provider, MD  Cholecalciferol (VITAMIN D-3) 5000  UNITS TABS Take 1 tablet by mouth daily.    Historical Provider, MD  escitalopram (LEXAPRO) 10 MG tablet Take 10 mg by mouth every morning.     Historical Provider, MD  HYDROcodone-acetaminophen (NORCO/VICODIN) 5-325 MG per tablet Take 1-2 tablets by mouth every 4 (four) hours as needed for moderate pain. 09/05/14   Burnard Bunting, MD  Multiple Vitamin (MULTIVITAMIN) tablet Take 1 tablet by mouth daily.    Historical Provider, MD  Naproxen Sodium (ALEVE) 220 MG CAPS Take 2 capsules by mouth daily.     Historical Provider, MD  triamterene-hydrochlorothiazide (MAXZIDE) 75-50 MG per tablet Take 1 tablet by mouth daily before breakfast.     Historical Provider, MD     History   Social History   . Marital Status: Married    Spouse Name: N/A  . Number of Children: 2  . Years of Education: N/A   Occupational History  . retired    Social History Main Topics  . Smoking status: Never Smoker   . Smokeless tobacco: Never Used  . Alcohol Use: No     Comment: 04/18/2012 "couple drinks of vodka/day til ~ 6 yr ago; nothing in the last 6 yrs"  . Drug Use: No  . Sexual Activity: Yes    Birth Control/ Protection: None   Other Topics Concern  . Not on file   Social History Narrative    No family status information on file.   Family History  Problem Relation Age of Onset  . Kidney failure Mother   . Anuerysm Father     AAA     ROS:  Full 14 point review of systems complete and found to be negative unless listed above.  Physical Exam: Blood pressure 128/75, pulse 68, temperature 98 F (36.7 C), temperature source Oral, resp. rate 16, height 5\' 6"  (1.676 m), weight 165 lb (74.844 kg), SpO2 98 %.  General: Well developed, well nourished, female in no acute distress Head: Eyes PERRLA, No xanthomas.   Normocephalic and atraumatic, oropharynx without edema or exudate. Lungs: CTAB Heart: HRRR S1 S2, no rub/gallop, Heart irregular rate and rhythm with S1, S2  murmur. pulses are 2+ extrem.   Neck: No carotid bruits. No lymphadenopathy. No JVD. Abdomen: Bowel sounds present, abdomen soft and non-tender without masses or hernias noted. Msk:  No spine or cva tenderness. No weakness, no joint deformities or effusions. Extremities: No clubbing or cyanosis. No edema.  Neuro: Alert and oriented X 3. No focal deficits noted. Psych:  Good affect, responds appropriately Skin: No rashes or lesions noted.  Labs:   Lab Results  Component Value Date   WBC 6.7 10/19/2014   HGB 14.7 10/19/2014   HCT 44.3 10/19/2014   MCV 93.7 10/19/2014   PLT 232 10/19/2014   No results for input(s): INR in the last 72 hours.  Recent Labs Lab 10/19/14 1115  NA 140  K 3.7  CL 101  CO2 28  BUN 23   CREATININE 0.98  CALCIUM 10.0  GLUCOSE 97   No results found for: MG  Recent Labs  10/19/14 1115 10/19/14 1500  TROPONINI 0.04* 0.05*    Lab Results  Component Value Date   DDIMER 1.20* 10/19/2014   Echo: none   ECG: HR 66 Sinus rhythm with sinus arrhythmia with occasional Premature ventricular complexes Otherwise normal ECG  Radiology:  Dg Chest 2 View  10/19/2014   CLINICAL DATA:  Dizziness, palpitations starting Wednesday  EXAM: CHEST  2 VIEW  COMPARISON:  09/04/2014  FINDINGS: Cardiomediastinal silhouette is stable. No acute infiltrate or pleural effusion. No pulmonary edema. Surgical clips in left axilla again noted. Again noted multiple left upper healing rib fractures. Thoracic spine osteopenia. Hyperinflation again noted. Stable compression deformity mid thoracic spine. Prior vertebroplasty lower thoracic/ upper lumbar spine.  IMPRESSION: Hyperinflation. No active disease. Multiple healing left upper rib fractures again noted.   Electronically Signed   By: Lahoma Crocker M.D.   On: 10/19/2014 11:11   Ct Angio Chest Pe W/cm &/or Wo Cm  10/19/2014   CLINICAL DATA:  Patient status post fall 7 weeks prior. Multiple rib fractures. Shortness of breath. Elevated D-dimer. Prior left breast lumpectomy.  EXAM: CT ANGIOGRAPHY CHEST WITH CONTRAST  TECHNIQUE: Multidetector CT imaging of the chest was performed using the standard protocol during bolus administration of intravenous contrast. Multiplanar CT image reconstructions and MIPs were obtained to evaluate the vascular anatomy.  CONTRAST:  180mL OMNIPAQUE IOHEXOL 350 MG/ML SOLN  COMPARISON:  Chest CT 10/25/2012  FINDINGS: Adequate opacification of the main pulmonary artery. No evidence for pulmonary embolism.  Mediastinum/Nodes: Heart is mildly enlarged. Trace pericardial fluid. No enlarged axillary or mediastinal adenopathy. Postsurgical changes left axilla.  Lungs/Pleura: Central airways are patent. Subpleural ground-glass and consolidative  opacities within the bilateral lower lobes. No large consolidative pulmonary opacities. Trace pleural fluid. Biapical pleural parenchymal thickening.  Upper abdomen: Unremarkable  Musculoskeletal: Multiple minimally displaced left lateral and posterior rib fractures are demonstrated involving the third through eleventh ribs. Age indeterminate posterior right ninth rib fracture. Re- demonstrated kyphoplasty material within the L1 and L2 vertebral bodies.  Review of the MIP images confirms the above findings.  IMPRESSION: No evidence for pulmonary embolism.  Multiple left-sided rib fractures.   Electronically Signed   By: Lovey Newcomer M.D.   On: 10/19/2014 13:37    ASSESSMENT AND PLAN:    Active Problems:   Fatigue   VAIL BASISTA is a 81 y.o. female with a history of HTN, HLD, and hx of breast cancer who presented to [facility] with SOB and fatigue.  Mildly elevated troponin- 0.04--> 0.05. ECG with no acute ST or TW changes. She does have a history of exertional SOB. No CP. No family hx of CAD -- Continue to cycle. If continue to be flat and mild, consider stress testing. If continue to rise or high suspicion for CAD plan for LHC. She did just have contrast exposure with CTA today. (normal creat) -- No heparin gtt for now. If enzymes continue to trend upwards may start.  -- Will obtain 2D ECHO  Elevated D-Dimer- 1.2. CTA neg for PE.   Frequent PVCs on tele- she also complains of palpitations from time to time. Would add a BB.   HLD- continue statin   HTN- hold maxide for now. Will add a low dose BB with ventricular ectopy, palpitations and possible CAD.    SignedEileen Stanford, PA-C 10/19/2014 4:30 PM  Pager [phone]  Co-Sign MD  I have seen and examined the patient along with THOMPSON, KATHRYN R, PA-C.  I have reviewed the chart, notes and new data.  I agree with PA's note.  Key new complaints: exertional dyspnea, recent fall with injury (syncope? - history uncertain) Key  examination changes: frequent ectopy Key new findings / data: PVCs with RBBB morphology. monomorphic  PLAN: Echo as initial triage tool - if LVEF is low, coronary angio. If EF normal, outpatient workup. Note fairly minimal coronary calcium on non-dedicated CTA Beta blockers. Consider  event monitor at discharge if further suspicion for syncope/arrhythmia based on workup  Sanda Klein, MD, [facility] and [place] [phone] 10/19/2014, 5:43 PM

## 2014-10-19 NOTE — ED Provider Notes (Signed)
CSN: [id number]     Arrival date & time 10/19/14  1012 History   First MD Initiated Contact with Patient 10/19/14 1037     Chief Complaint  Patient presents with  . Shortness of Breath     (Consider location/radiation/quality/duration/timing/severity/associated sxs/prior Treatment) HPI  Pt presenting with c/o generalized weakness and fatigue associated with shortness of breath on exertion.  Pt states symptoms have been ongoing for the past week.  She feels that when she is up and walking her heart is pounding and skipping beats.  No chest pain.  She describes feeling that her arms and legs are 'like jello".  She was hospitalized recently after a fall for rib fractures- she states the pain from these is improving.  She also took a trip to [place] last week. Since returning home she has been lying in bed and not attending to her daily activities.  No fever/chills. No cough.  No leg swelling.  No vomiting or diarrhea.  There are no other associated systemic symptoms, there are no other alleviating or modifying factors.   Past Medical History  Diagnosis Date  . Hypertension   . High cholesterol   . Complication of anesthesia 04/18/2012    "didn't tolerate it today very well; had the shakes and very hard time w/it"  . Arthritis     "in my back"  . Osteoporosis   . Pneumonia     hx of pneumonia- 1971  . Peripheral vascular disease     hx of ligation   . Breast cancer 1996    left breast cancer   . Depression 11/20/2012  . History of alcoholism     7 1/2 years clean   Past Surgical History  Procedure Laterality Date  . Knee arthroscopy  04/17/2012    Procedure: ARTHROSCOPY KNEE;  Surgeon: Rudean Haskell, MD;  Location: [place] [facility];  Service: Orthopedics;  Laterality: Right;  . I&d extremity  04/17/2012    Procedure: IRRIGATION AND DEBRIDEMENT EXTREMITY;  Surgeon: Rudean Haskell, MD;  Location: [place];  Service: Orthopedics;  Laterality: Right;  . Tonsillectomy and adenoidectomy      "I was a  child"  . Posterior laminectomy / decompression lumbar spine  1980's  . Breast lumpectomy Left 1996  . Menisectomy Right 04/11/2012  . Rotator cuff surgery  Right   . Vein ligation    . Total knee arthroplasty Right 10/29/2012    Procedure: RIGHT TOTAL KNEE ARTHROPLASTY;  Surgeon: Gearlean Alf, MD;  Location: [facility];  Service: Orthopedics;  Laterality: Right;  . Kyphoplasty  2009, 2013    thorasic, lumbar   Family History  Problem Relation Age of Onset  . Kidney failure Mother   . Anuerysm Father     AAA   History  Substance Use Topics  . Smoking status: Never Smoker   . Smokeless tobacco: Never Used  . Alcohol Use: No     Comment: 04/18/2012 "couple drinks of vodka/day til ~ 6 yr ago; nothing in the last 6 yrs"   OB History    No data available     Review of Systems  ROS reviewed and all otherwise negative except for mentioned in HPI    Allergies  Oysters and Codeine  Home Medications   Prior to Admission medications   Medication Sig Start Date End Date Taking? Authorizing Provider  alendronate (FOSAMAX) 70 MG tablet Take 70 mg by mouth once a week. Every Sunday. 08/21/13   Historical Provider, MD  aspirin 81 MG tablet Take 81 mg by mouth daily.    Historical Provider, MD  Cholecalciferol (VITAMIN D-3) 5000 UNITS TABS Take 1 tablet by mouth daily.    Historical Provider, MD  escitalopram (LEXAPRO) 10 MG tablet Take 10 mg by mouth every morning.     Historical Provider, MD  HYDROcodone-acetaminophen (NORCO/VICODIN) 5-325 MG per tablet Take 1-2 tablets by mouth every 4 (four) hours as needed for moderate pain. 09/05/14   Burnard Bunting, MD  Multiple Vitamin (MULTIVITAMIN) tablet Take 1 tablet by mouth daily.    Historical Provider, MD  Naproxen Sodium (ALEVE) 220 MG CAPS Take 2 capsules by mouth daily.     Historical Provider, MD  triamterene-hydrochlorothiazide (MAXZIDE) 75-50 MG per tablet Take 1 tablet by mouth daily before breakfast.     Historical Provider, MD   BP  128/75 mmHg  Pulse 68  Temp(Src) 98 F (36.7 C) (Oral)  Resp 16  Ht 5\' 6"  (1.676 m)  Wt 165 lb (74.844 kg)  BMI 26.64 kg/m2  SpO2 98%  Vitals reviewed Physical Exam  Physical Examination: General appearance - alert, well appearing, and in no distress Mental status - alert, oriented to person, place, and time Eyes - pupils equal and reactive, extraocular eye movements intact Mouth - mucous membranes moist, pharynx normal without lesions Chest - clear to auscultation, no wheezes, rales or rhonchi, symmetric air entry Heart - normal rate, regular rhythm, normal S1, S2, no murmurs, rubs, clicks or gallops Abdomen - soft, nontender, nondistended, no masses or organomegaly Neurological - alert, oriented x 3, no cranial nerve deficit, strength 5/5 in extremities x 4, sensation intact Extremities - peripheral pulses normal, no pedal edema, no clubbing or cyanosis Skin - normal coloration and turgor, no rashes  ED Course  Procedures (including critical care time)  2:16 PM d/w Dr. Debara Pickett, cardiology.  He requests a second troponin now, only continue heparin if troponin is rising.   Labs Review Labs Reviewed  BASIC METABOLIC PANEL - Abnormal; Notable for the following:    GFR calc non Af Amer 53 (*)    GFR calc Af Amer 61 (*)    All other components within normal limits  TROPONIN I - Abnormal; Notable for the following:    Troponin I 0.04 (*)    All other components within normal limits  BRAIN NATRIURETIC PEPTIDE - Abnormal; Notable for the following:    B Natriuretic Peptide 141.2 (*)    All other components within normal limits  D-DIMER, QUANTITATIVE - Abnormal; Notable for the following:    D-Dimer, Quant 1.20 (*)    All other components within normal limits  TROPONIN I - Abnormal; Notable for the following:    Troponin I 0.05 (*)    All other components within normal limits  URINALYSIS, ROUTINE W REFLEX MICROSCOPIC - Abnormal; Notable for the following:    Specific Gravity, Urine  1.044 (*)    Leukocytes, UA SMALL (*)    All other components within normal limits  URINE MICROSCOPIC-ADD ON - Abnormal; Notable for the following:    Squamous Epithelial / LPF FEW (*)    All other components within normal limits  CBC    Imaging Review Dg Chest 2 View  10/19/2014   CLINICAL DATA:  Dizziness, palpitations starting Wednesday  EXAM: CHEST  2 VIEW  COMPARISON:  09/04/2014  FINDINGS: Cardiomediastinal silhouette is stable. No acute infiltrate or pleural effusion. No pulmonary edema. Surgical clips in left axilla again noted. Again noted multiple left  upper healing rib fractures. Thoracic spine osteopenia. Hyperinflation again noted. Stable compression deformity mid thoracic spine. Prior vertebroplasty lower thoracic/ upper lumbar spine.  IMPRESSION: Hyperinflation. No active disease. Multiple healing left upper rib fractures again noted.   Electronically Signed   By: Lahoma Crocker M.D.   On: 10/19/2014 11:11   Ct Angio Chest Pe W/cm &/or Wo Cm  10/19/2014   CLINICAL DATA:  Patient status post fall 7 weeks prior. Multiple rib fractures. Shortness of breath. Elevated D-dimer. Prior left breast lumpectomy.  EXAM: CT ANGIOGRAPHY CHEST WITH CONTRAST  TECHNIQUE: Multidetector CT imaging of the chest was performed using the standard protocol during bolus administration of intravenous contrast. Multiplanar CT image reconstructions and MIPs were obtained to evaluate the vascular anatomy.  CONTRAST:  132mL OMNIPAQUE IOHEXOL 350 MG/ML SOLN  COMPARISON:  Chest CT 10/25/2012  FINDINGS: Adequate opacification of the main pulmonary artery. No evidence for pulmonary embolism.  Mediastinum/Nodes: Heart is mildly enlarged. Trace pericardial fluid. No enlarged axillary or mediastinal adenopathy. Postsurgical changes left axilla.  Lungs/Pleura: Central airways are patent. Subpleural ground-glass and consolidative opacities within the bilateral lower lobes. No large consolidative pulmonary opacities. Trace pleural  fluid. Biapical pleural parenchymal thickening.  Upper abdomen: Unremarkable  Musculoskeletal: Multiple minimally displaced left lateral and posterior rib fractures are demonstrated involving the third through eleventh ribs. Age indeterminate posterior right ninth rib fracture. Re- demonstrated kyphoplasty material within the L1 and L2 vertebral bodies.  Review of the MIP images confirms the above findings.  IMPRESSION: No evidence for pulmonary embolism.  Multiple left-sided rib fractures.   Electronically Signed   By: Lovey Newcomer M.D.   On: 10/19/2014 13:37     EKG Interpretation   Date/Time:  Sunday October 19 2014 13:54:07 EDT Ventricular Rate:  66 PR Interval:  182 QRS Duration: 100 QT Interval:  422 QTC Calculation: 442 R Axis:   17 Text Interpretation:  Sinus rhythm with sinus arrhythmia with occasional  Premature ventricular complexes Otherwise normal ECG Since previous  tracing PVCs are new Confirmed by [facility]  MD, MARTHA [id number]) on 10/19/2014  2:47:57 PM      MDM   Final diagnoses:  Shortness of breath  Generalized weakness  Elevated troponin    Pt presenting with c/o generalized weakness and shortness of breath.  D-dimer elevated, CT angio did not show evidence of PE.  Troponin is elevated 0.04.  Pt is having PVCs and runs of trigeminy on EKG.  D/w cardiology as noted above.  Heparin initially started due to elevated troponin, stopped as cardiology has lower suspicion for the mild elevation representing true cardiac injuryPt to be admitted to [facility] to telemetry bed.  Pt updated and is agreeable with plan.     Alfonzo Beers, MD 10/19/14 [phone]

## 2014-10-20 ENCOUNTER — Other Ambulatory Visit (HOSPITAL_COMMUNITY): Payer: Self-pay | Admitting: Physician Assistant

## 2014-10-20 DIAGNOSIS — R0600 Dyspnea, unspecified: Secondary | ICD-10-CM | POA: Diagnosis not present

## 2014-10-20 DIAGNOSIS — I10 Essential (primary) hypertension: Secondary | ICD-10-CM | POA: Diagnosis not present

## 2014-10-20 DIAGNOSIS — R55 Syncope and collapse: Secondary | ICD-10-CM

## 2014-10-20 DIAGNOSIS — I951 Orthostatic hypotension: Secondary | ICD-10-CM | POA: Diagnosis not present

## 2014-10-20 DIAGNOSIS — R0609 Other forms of dyspnea: Secondary | ICD-10-CM | POA: Diagnosis not present

## 2014-10-20 DIAGNOSIS — R7989 Other specified abnormal findings of blood chemistry: Secondary | ICD-10-CM

## 2014-10-20 DIAGNOSIS — I493 Ventricular premature depolarization: Secondary | ICD-10-CM

## 2014-10-20 DIAGNOSIS — R531 Weakness: Secondary | ICD-10-CM | POA: Diagnosis not present

## 2014-10-20 LAB — CBC
HCT: 42.6 % (ref 36.0–46.0)
Hemoglobin: 14.1 g/dL (ref 12.0–15.0)
MCH: 31.2 pg (ref 26.0–34.0)
MCHC: 33.1 g/dL (ref 30.0–36.0)
MCV: 94.2 fL (ref 78.0–100.0)
Platelets: 189 10*3/uL (ref 150–400)
RBC: 4.52 MIL/uL (ref 3.87–5.11)
RDW: 13.3 % (ref 11.5–15.5)
WBC: 5.7 10*3/uL (ref 4.0–10.5)

## 2014-10-20 LAB — BASIC METABOLIC PANEL
Anion gap: 10 (ref 5–15)
BUN: 20 mg/dL (ref 6–23)
CALCIUM: 9.5 mg/dL (ref 8.4–10.5)
CO2: 25 mmol/L (ref 19–32)
Chloride: 105 mmol/L (ref 96–112)
Creatinine, Ser: 0.83 mg/dL (ref 0.50–1.10)
GFR, EST AFRICAN AMERICAN: 75 mL/min — AB (ref >90)
GFR, EST NON AFRICAN AMERICAN: 65 mL/min — AB (ref >90)
GLUCOSE: 86 mg/dL (ref 70–99)
POTASSIUM: 3.5 mmol/L (ref 3.5–5.1)
SODIUM: 140 mmol/L (ref 135–145)

## 2014-10-20 LAB — TROPONIN I
Troponin I: 0.04 ng/mL — ABNORMAL HIGH (ref <0.031)
Troponin I: 0.03 ng/mL (ref <0.031)

## 2014-10-20 LAB — INFLUENZA PANEL BY PCR (TYPE A & B)
H1N1 flu by pcr: NOT DETECTED
Influenza A By PCR: NEGATIVE
Influenza B By PCR: NEGATIVE

## 2014-10-20 MED ORDER — CARVEDILOL 3.125 MG PO TABS
3.1250 mg | ORAL_TABLET | Freq: Two times a day (BID) | ORAL | Status: DC
Start: 2014-10-20 — End: 2014-11-24

## 2014-10-20 NOTE — Discharge Instructions (Addendum)
Take your blood pressure daily, vary the times, and report results.  Near-Syncope Near-syncope (commonly known as near fainting) is sudden weakness, dizziness, or feeling like you might pass out. During an episode of near-syncope, you may also develop pale skin, have tunnel vision, or feel sick to your stomach (nauseous). Near-syncope may occur when getting up after sitting or while standing for a long time. It is caused by a sudden decrease in blood flow to the brain. This decrease can result from various causes or triggers, most of which are not serious. However, because near-syncope can sometimes be a sign of something serious, a medical evaluation is required. The specific cause is often not determined. HOME CARE INSTRUCTIONS  Monitor your condition for any changes. The following actions may help to alleviate any discomfort you are experiencing:  Have someone stay with you until you feel stable.  Lie down right away and prop your feet up if you start feeling like you might faint. Breathe deeply and steadily. Wait until all the symptoms have passed. Most of these episodes last only a few minutes. You may feel tired for several hours.   Drink enough fluids to keep your urine clear or pale yellow.   If you are taking blood pressure or heart medicine, get up slowly when seated or lying down. Take several minutes to sit and then stand. This can reduce dizziness.  Follow up with your health care provider as directed. SEEK IMMEDIATE MEDICAL CARE IF:   You have a severe headache.   You have unusual pain in the chest, abdomen, or back.   You are bleeding from the mouth or rectum, or you have black or tarry stool.   You have an irregular or very fast heartbeat.   You have repeated fainting or have seizure-like jerking during an episode.   You faint when sitting or lying down.   You have confusion.   You have difficulty walking.   You have severe weakness.   You have vision  problems.  MAKE SURE YOU:   Understand these instructions.  Will watch your condition.  Will get help right away if you are not doing well or get worse. Document Released: 07/11/2005 Document Revised: 07/16/2013 Document Reviewed: 12/14/2012 [facility] Patient Information 2015 [place], [place]. This information is not intended to replace advice given to you by your health care provider. Make sure you discuss any questions you have with your health care provider.

## 2014-10-20 NOTE — Discharge Summary (Signed)
Physician Discharge Summary  [name] VPX:[id number] DOB: 02-Feb-1932 DOA: 10/19/2014  PCP: Geoffery Lyons, MD  Admit date: 10/19/2014 Discharge date: 10/20/2014  Recommendations for Outpatient Follow-up:  1. Pt will follow up with PCP and cardio per scheduled appt  Discharge Diagnoses:  Principal Problem:   Dyspnea on exertion Active Problems:   Septic joint of right knee joint   OA (osteoarthritis) of knee   Depression   Essential hypertension, benign   HX: breast cancer   Fatigue   Generalized weakness   Syncope   Elevated troponin   Near syncope   Orthostatic hypotension    Discharge Condition: stable   Diet recommendation: as tolerated   History of present illness:  81 y.o. female, history of breast cancer with left-sided lumpectomy 20 years ago, essential hypertension, dyslipidemia, PAD, remote history of alcohol abuse (quit 7 1/2 years ago) who presented to [facility] with palpitations, exertional shortness of breath for past few days prior to this admission. She had a fall about 2 months ago, mechanical and no associated syncope. She was transferred to [facility] for further evaluation.   Hospital Course:   Principal Problem:  Dyspnea on exertion / Palpitations / PVC's - No further episode - CT and CXR ok - Few PVC on EKG so cardio put on low dose coreg however she as hypotensive so instructed on d/c to check BP prior to taking this medication.   Active Problems:  Essential hypertension, benign - On maxzide and low dose coreg, instructed to check BP prior to getting BP meds   Elevated troponin - Minimal elevation in troponin, no chest pain - Echocardiogram done - preserved EF   Near syncope - May have been secondary to orthostatic hypotension   Orthostatic hypotension - On admission, SBP dropped from 135 down to 97 with standing, improved to 117 after 3 minutes - Rechecked prior to discharge, stable    Signed:  Leisa Lenz, MD  [facility] 10/20/2014, 2:26 PM  Pager #: [phone]  Discharge Exam: Filed Vitals:   10/20/14 1202  BP: 133/58  Pulse: 60  Temp: 98 F (36.7 C)  Resp: 17   Filed Vitals:   10/20/14 0014 10/20/14 0555 10/20/14 0818 10/20/14 1202  BP: 131/56 107/53 115/60 133/58  Pulse:  55 66 60  Temp:  97.6 F (36.4 C) 98.2 F (36.8 C) 98 F (36.7 C)  TempSrc:  Oral Oral Oral  Resp:  20 18 17   Height:  5' 6.5" (1.689 m)    Weight:  68.085 kg (150 lb 1.6 oz)    SpO2:  96% 96% 97%    General: Pt is alert, follows commands appropriately, not in acute distress Cardiovascular: Regular rate and rhythm, S1/S2 +, no murmurs Respiratory: Clear to auscultation bilaterally, no wheezing, no crackles, no rhonchi Abdominal: Soft, non tender, non distended, bowel sounds +, no guarding Extremities: no edema, no cyanosis, pulses palpable bilaterally DP and PT Neuro: Grossly nonfocal  Discharge Instructions  Discharge Instructions    Call MD for:  difficulty breathing, headache or visual disturbances    Complete by:  As directed      Call MD for:  persistant nausea and vomiting    Complete by:  As directed      Call MD for:  severe uncontrolled pain    Complete by:  As directed      Diet - low sodium heart healthy    Complete by:  As directed      Discharge instructions  Complete by:  As directed   1. Follow up with cardio per scheduled appt     Increase activity slowly    Complete by:  As directed             Medication List    TAKE these medications        alendronate 70 MG tablet  Commonly known as:  FOSAMAX  Take 70 mg by mouth once a week. Every Sunday.     ALEVE 220 MG Caps  Generic drug:  Naproxen Sodium  Take 2 capsules by mouth daily.     aspirin EC 81 MG tablet  Take 81 mg by mouth daily.     carvedilol 3.125 MG tablet  Commonly known as:  COREG  Take 1 tablet (3.125 mg total) by mouth 2 (two) times daily with a meal.     Cholecalciferol 1000 UNITS capsule   Take 2,000 Units by mouth daily. [employer] VITAMIN D3 1000 UNIT CAPS     DRY EYES OP  Apply 1 drop to eye daily as needed (dry eyes).     escitalopram 10 MG tablet  Commonly known as:  LEXAPRO  Take 10 mg by mouth every morning.     Insulin Pen Needle 32G X 4 MM Misc  BD PEN NEEDLE NANO U/F 32G X 4 MM MISC     MULTIVITAMIN ADULT Tabs  MULTIVITAMINS TABS     simvastatin 20 MG tablet  Commonly known as:  ZOCOR  Take 20 mg by mouth daily.     triamterene-hydrochlorothiazide 75-50 MG per tablet  Commonly known as:  MAXZIDE  Take 1 tablet by mouth daily before breakfast.           Follow-up Information    Follow up with ARONSON,RICHARD A, MD. Schedule an appointment as soon as possible for a visit in 1 week.   Specialty:  Internal Medicine   Why:  Follow up appt after recent hospitalization   Contact information:   [place] [address] [phone]        The results of significant diagnostics from this hospitalization (including imaging, microbiology, ancillary and laboratory) are listed below for reference.    Significant Diagnostic Studies: Dg Chest 2 View  10/19/2014   CLINICAL DATA:  Dizziness, palpitations starting Wednesday  EXAM: CHEST  2 VIEW  COMPARISON:  09/04/2014  FINDINGS: Cardiomediastinal silhouette is stable. No acute infiltrate or pleural effusion. No pulmonary edema. Surgical clips in left axilla again noted. Again noted multiple left upper healing rib fractures. Thoracic spine osteopenia. Hyperinflation again noted. Stable compression deformity mid thoracic spine. Prior vertebroplasty lower thoracic/ upper lumbar spine.  IMPRESSION: Hyperinflation. No active disease. Multiple healing left upper rib fractures again noted.   Electronically Signed   By: Lahoma Crocker M.D.   On: 10/19/2014 11:11   Ct Angio Chest Pe W/cm &/or Wo Cm  10/19/2014   CLINICAL DATA:  Patient status post fall 7 weeks prior. Multiple rib fractures. Shortness of breath.  Elevated D-dimer. Prior left breast lumpectomy.  EXAM: CT ANGIOGRAPHY CHEST WITH CONTRAST  TECHNIQUE: Multidetector CT imaging of the chest was performed using the standard protocol during bolus administration of intravenous contrast. Multiplanar CT image reconstructions and MIPs were obtained to evaluate the vascular anatomy.  CONTRAST:  148mL OMNIPAQUE IOHEXOL 350 MG/ML SOLN  COMPARISON:  Chest CT 10/25/2012  FINDINGS: Adequate opacification of the main pulmonary artery. No evidence for pulmonary embolism.  Mediastinum/Nodes: Heart is mildly enlarged. Trace pericardial fluid. No  enlarged axillary or mediastinal adenopathy. Postsurgical changes left axilla.  Lungs/Pleura: Central airways are patent. Subpleural ground-glass and consolidative opacities within the bilateral lower lobes. No large consolidative pulmonary opacities. Trace pleural fluid. Biapical pleural parenchymal thickening.  Upper abdomen: Unremarkable  Musculoskeletal: Multiple minimally displaced left lateral and posterior rib fractures are demonstrated involving the third through eleventh ribs. Age indeterminate posterior right ninth rib fracture. Re- demonstrated kyphoplasty material within the L1 and L2 vertebral bodies.  Review of the MIP images confirms the above findings.  IMPRESSION: No evidence for pulmonary embolism.  Multiple left-sided rib fractures.   Electronically Signed   By: Lovey Newcomer M.D.   On: 10/19/2014 13:37    Microbiology: Recent Results (from the past 240 hour(s))  MRSA PCR Screening     Status: None   Collection Time: 10/19/14  5:05 PM  Result Value Ref Range Status   MRSA by PCR NEGATIVE NEGATIVE Final    Comment:        The GeneXpert MRSA Assay (FDA approved for NASAL specimens only), is one component of a comprehensive MRSA colonization surveillance program. It is not intended to diagnose MRSA infection nor to guide or monitor treatment for MRSA infections.      Labs: Basic Metabolic  Panel:  Recent Labs Lab 10/19/14 1115 10/19/14 1817 10/20/14 0533  NA 140  --  140  K 3.7  --  3.5  CL 101  --  105  CO2 28  --  25  GLUCOSE 97  --  86  BUN 23  --  20  CREATININE 0.98 0.98 0.83  CALCIUM 10.0  --  9.5   Liver Function Tests: No results for input(s): AST, ALT, ALKPHOS, BILITOT, PROT, ALBUMIN in the last 168 hours. No results for input(s): LIPASE, AMYLASE in the last 168 hours. No results for input(s): AMMONIA in the last 168 hours. CBC:  Recent Labs Lab 10/19/14 1115 10/19/14 1817 10/20/14 0533  WBC 6.7 6.9 5.7  HGB 14.7 14.2 14.1  HCT 44.3 41.5 42.6  MCV 93.7 92.8 94.2  PLT 232 217 189   Cardiac Enzymes:  Recent Labs Lab 10/19/14 1115 10/19/14 1500 10/19/14 1817 10/19/14 2310 10/20/14 0533  TROPONINI 0.04* 0.05* 0.05* 0.04* 0.03   BNP: BNP (last 3 results)  Recent Labs  10/19/14 1115  BNP 141.2*    ProBNP (last 3 results) No results for input(s): PROBNP in the last 8760 hours.  CBG: No results for input(s): GLUCAP in the last 168 hours.  Time coordinating discharge: Over 30 minutes

## 2014-10-20 NOTE — Progress Notes (Signed)
PT note Orthostatic BPs  Supine 133/66, 63 bpm  Sitting 117/79, 63 bpm  Standing 104/63, 71 bpm  Standing after 3 min 132/77, 73 bpm  Pt asymptomatic. [place] [phone] (pager)

## 2014-10-20 NOTE — Evaluation (Signed)
Physical Therapy Evaluation Patient Details Name: IZOLA TEAGUE MRN: [id number] DOB: [birth year] Today's Date: 10/20/2014   History of Present Illness  Pt admit with orthostatic hypotension and fatigue.    Clinical Impression  Pt admitted with above diagnosis. Pt currently with functional limitations due to the deficits listed below (see PT Problem List). Pt able to ambulate with guard assist.  Aware that she needs to use RW at home for safety in home.  Will progress self  to cane.  To ask MD when she can return to gym.  Pt and daughter feel that pt does not need HHPT.  Will follow acutely.    Pt will benefit from skilled PT to increase their independence and safety with mobility to allow discharge to the venue listed below.     Follow Up Recommendations No PT follow up;Supervision - Intermittent    Equipment Recommendations  None recommended by PT    Recommendations for Other Services       Precautions / Restrictions Precautions Precautions: Fall Restrictions Weight Bearing Restrictions: No      Mobility  Bed Mobility Overal bed mobility: Independent                Transfers Overall transfer level: Needs assistance Equipment used: None Transfers: Sit to/from Stand Sit to Stand: Min guard         General transfer comment: Pt needed steadying assist initially upon standing.    Ambulation/Gait Ambulation/Gait assistance: Min guard Ambulation Distance (Feet): 450 Feet Assistive device: None Gait Pattern/deviations: Step-through pattern;Decreased stride length   Gait velocity interpretation: <1.8 ft/sec, indicative of risk for recurrent falls General Gait Details: Pt ambulating without device with overall steady gait if not challenged.  At times pt reaching for rails in hall.  Occasional LOB with ambulation with challenges.  Recommend RW initially. Pt and daughter agree.  Pt to progress back to cane.    Stairs Stairs: Yes Stairs assistance: Min guard Stair  Management: One rail Left;Alternating pattern;Forwards Number of Stairs: 4 General stair comments: Pt able to ascend and descend stairs with supervision using rail.  No LOB.    Wheelchair Mobility    Modified Rankin (Stroke Patients Only)       Balance Overall balance assessment: Needs assistance;History of Falls Sitting-balance support: No upper extremity supported;Feet supported Sitting balance-Leahy Scale: Good     Standing balance support: No upper extremity supported;During functional activity Standing balance-Leahy Scale: Fair Standing balance comment: can stand statically without UE support.              High level balance activites: Direction changes;Turns;Sudden stops;Head turns High Level Balance Comments: min guard assist for stability without UE support.   Standardized Balance Assessment Standardized Balance Assessment : Dynamic Gait Index   Dynamic Gait Index Level Surface: Mild Impairment Change in Gait Speed: Normal Gait with Horizontal Head Turns: Mild Impairment Gait with Vertical Head Turns: Mild Impairment Gait and Pivot Turn: Mild Impairment Step Over Obstacle: Moderate Impairment Step Around Obstacles: Mild Impairment Steps: Mild Impairment Total Score: 16       Pertinent Vitals/Pain Pain Assessment: No/denies pain     Orthostatic BPs  Supine 133/66, 63 bpm  Sitting 117/79, 63 bpm  Standing 104/63, 71 bpm  Standing after 3 min 132/77, 73 bpm  Pt asymptomatic.       Home Living Family/patient expects to be discharged to:: Private residence Living Arrangements: Spouse/significant other Available Help at Discharge: Family;Available PRN/intermittently Type of Home: House Home Access: Stairs to  enter Entrance Stairs-Rails: Left Entrance Stairs-Number of Steps: 3 Home Layout: Multi-level;Able to live on main level with bedroom/bathroom Home Equipment: Gilford Rile - 2 wheels;Cane - single point;Bedside commode;Grab bars -  tub/shower (walk in shower in husbands shower she can use) Additional Comments: Med alert now; office upstairs but those are easier stairs than basement steps that pt fell on.  Pt also goes to gym 3 days a week and has a Physiological scientist.      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  Will need to use assistive device for safety and pt and daughter agree.  Pt and daughter are aware that pt at risk for falls without device.      Exercises        Assessment/Plan    PT Assessment Patient needs continued PT services  PT Diagnosis Generalized weakness   PT Problem List Decreased activity tolerance;Decreased balance;Decreased mobility;Decreased knowledge of use of DME;Decreased safety awareness;Decreased knowledge of precautions  PT Treatment Interventions DME instruction;Gait training;Functional mobility training;Therapeutic activities;Therapeutic exercise;Balance training;Patient/family education;Stair training   PT Goals (Current goals can be found in the Care Plan section) Acute Rehab PT Goals Patient Stated Goal: to gohome PT Goal Formulation: With patient Time For Goal Achievement: 10/27/14 Potential to Achieve Goals: Good    Frequency Min 3X/week   Barriers to discharge        Co-evaluation               End of  Session Equipment Utilized During Treatment: Gait belt Activity Tolerance: Patient limited by fatigue Patient left: in bed;with call bell/phone within reach;with family/visitor present;with bed alarm set Nurse Communication: Mobility status         Time: 0347-4259 PT Time Calculation (min) (ACUTE ONLY): 44 min   Charges:   PT Evaluation $Initial PT Evaluation Tier I: 1 Procedure PT Treatments $Gait Training: 8-22 mins $Self Care/Home Management: 8-22   PT G CodesIrwin Brakeman F Nov 16, 2014, 1:15 PM [place] Start,PT Acute Rehabilitation [phone] (pager)

## 2014-10-20 NOTE — Progress Notes (Signed)
Patient Name: [name] Date of Encounter: 10/20/2014  Principal Problem:   Dyspnea on exertion Active Problems:   Septic joint of right knee joint   OA (osteoarthritis) of knee   Depression   Essential hypertension, benign   HX: breast cancer   Fatigue   Generalized weakness   Syncope   Elevated troponin   Near syncope   Orthostatic hypotension   Primary Cardiologist: Dr. Sallyanne Kuster  Patient Profile: 81 y.o. female with a history of HTN, HLD, breast cancer, hx of alcoholism now in remission, admitted 03/27 with SOB and fatigue.  SUBJECTIVE: Denies history of syncope, feels much better today, feels the palpitations, lightheaded feeling has improved but she has not been out of bed much.  OBJECTIVE Filed Vitals:   10/20/14 0000 10/20/14 0014 10/20/14 0555 10/20/14 0818  BP: 92/63 131/56 107/53 115/60  Pulse: 68  55 66  Temp:   97.6 F (36.4 C) 98.2 F (36.8 C)  TempSrc: Oral  Oral Oral  Resp: 28  20 18   Height:   5' 6.5" (1.689 m)   Weight:   150 lb 1.6 oz (68.085 kg)   SpO2: 94%  96% 96%    Intake/Output Summary (Last 24 hours) at 10/20/14 1010 Last data filed at 10/20/14 0700  Gross per 24 hour  Intake 1461.25 ml  Output    350 ml  Net 1111.25 ml   Filed Weights   10/19/14 1018 10/19/14 1645 10/20/14 0555  Weight: 165 lb (74.844 kg) 151 lb 4.8 oz (68.629 kg) 150 lb 1.6 oz (68.085 kg)    PHYSICAL EXAM General: Well developed, well nourished, female in no acute distress. Head: Normocephalic, atraumatic.  Neck: Supple without bruits, JVD not elevated. Lungs:  Resp regular and unlabored, few dry rales. Heart: RRR, S1, S2, no S3, S4, or murmur; no rub. Abdomen: Soft, non-tender, non-distended, BS + x 4.  Extremities: No clubbing, cyanosis, no edema.  Neuro: Alert and oriented X 3. Moves all extremities spontaneously. Psych: Normal affect.  LABS: CBC:  Recent Labs  10/19/14 1817 10/20/14 0533  WBC 6.9 5.7  HGB 14.2 14.1  HCT 41.5 42.6  MCV  92.8 94.2  PLT 217 884   Basic Metabolic Panel:  Recent Labs  10/19/14 1115 10/19/14 1817 10/20/14 0533  NA 140  --  140  K 3.7  --  3.5  CL 101  --  105  CO2 28  --  25  GLUCOSE 97  --  86  BUN 23  --  20  CREATININE 0.98 0.98 0.83  CALCIUM 10.0  --  9.5   Cardiac Enzymes:  Recent Labs  10/19/14 1817 10/19/14 2310 10/20/14 0533  TROPONINI 0.05* 0.04* 0.03   BNP:  B NATRIURETIC PEPTIDE  Date/Time Value Ref Range Status  10/19/2014 11:15 AM 141.2* 0.0 - 100.0 pg/mL Final   D-dimer:  Recent Labs  10/19/14 1115  DDIMER 1.20*   Thyroid Function Tests:  Recent Labs  10/19/14 1817  TSH 2.380   TELE:  Sinus rhythm, sinus bradycardia, PVCs, sometimes frequent      Radiology/Studies: Dg Chest 2 View 10/19/2014   CLINICAL DATA:  Dizziness, palpitations starting Wednesday  EXAM: CHEST  2 VIEW  COMPARISON:  09/04/2014  FINDINGS: Cardiomediastinal silhouette is stable. No acute infiltrate or pleural effusion. No pulmonary edema. Surgical clips in left axilla again noted. Again noted multiple left upper healing rib fractures. Thoracic spine osteopenia. Hyperinflation again noted. Stable compression deformity mid thoracic spine. Prior  vertebroplasty lower thoracic/ upper lumbar spine.  IMPRESSION: Hyperinflation. No active disease. Multiple healing left upper rib fractures again noted.   Electronically Signed   By: Lahoma Crocker M.D.   On: 10/19/2014 11:11   Ct Angio Chest Pe W/cm &/or Wo Cm 10/19/2014   CLINICAL DATA:  Patient status post fall 7 weeks prior. Multiple rib fractures. Shortness of breath. Elevated D-dimer. Prior left breast lumpectomy.  EXAM: CT ANGIOGRAPHY CHEST WITH CONTRAST  TECHNIQUE: Multidetector CT imaging of the chest was performed using the standard protocol during bolus administration of intravenous contrast. Multiplanar CT image reconstructions and MIPs were obtained to evaluate the vascular anatomy.  CONTRAST:  188mL OMNIPAQUE IOHEXOL 350 MG/ML SOLN   COMPARISON:  Chest CT 10/25/2012  FINDINGS: Adequate opacification of the main pulmonary artery. No evidence for pulmonary embolism.  Mediastinum/Nodes: Heart is mildly enlarged. Trace pericardial fluid. No enlarged axillary or mediastinal adenopathy. Postsurgical changes left axilla.  Lungs/Pleura: Central airways are patent. Subpleural ground-glass and consolidative opacities within the bilateral lower lobes. No large consolidative pulmonary opacities. Trace pleural fluid. Biapical pleural parenchymal thickening.  Upper abdomen: Unremarkable  Musculoskeletal: Multiple minimally displaced left lateral and posterior rib fractures are demonstrated involving the third through eleventh ribs. Age indeterminate posterior right ninth rib fracture. Re- demonstrated kyphoplasty material within the L1 and L2 vertebral bodies.  Review of the MIP images confirms the above findings.  IMPRESSION: No evidence for pulmonary embolism.  Multiple left-sided rib fractures.   Electronically Signed   By: Lovey Newcomer M.D.   On: 10/19/2014 13:37   Current Medications:  . aspirin EC  81 mg Oral Daily  . carvedilol  3.125 mg Oral BID WC  . cefTRIAXone (ROCEPHIN)  IV  1 g Intravenous Q24H  . cholecalciferol  1,000 Units Oral Daily  . escitalopram  10 mg Oral q morning - 10a  . heparin  5,000 Units Subcutaneous 3 times per day  . multivitamin with minerals  1 tablet Oral Daily  . sodium chloride  3 mL Intravenous Q12H   . sodium chloride 75 mL/hr at 10/19/14 1832    ASSESSMENT AND PLAN: Principal Problem:   Dyspnea on exertion - per IM, CXR and chest CT ok  Active Problems:   Septic joint of right knee joint - Per IM    OA (osteoarthritis) of knee - Per IM    Depression - Per IM    Essential hypertension, benign - Per IM, good control on current therapy    HX: breast cancer - Per IM    Generalized weakness - Per IM    Syncope - Patient denies syncope, see below    Elevated troponin - Minimal  elevation in troponin, no chest pain - Echocardiogram ordered, follow up on results    Near syncope - May have been secondary to orthostatic hypotension    PVCs - Frequent at times and she feels them. - Low-dose beta blocker added 03/27, however no doses given due to bradycardia and hypotension - Heart rate low 50s overnight, sinus bradycardia - M.D. advise on continuing beta blocker    Orthostatic hypotension - On admission, SBP dropped from 135 down to 97 with standing, improved to 117 after 3 minutes - SBP as low as 85 on orthostatic vital signs recheck last p.m. - Recheck orthostatics today, patient being hydrated - Per IM  Signed, Rosaria Ferries , PA-C 10:10 AM 10/20/2014 Patient seen and examined and history reviewed. Agree with above findings and plan. Feels better today. No  dizziness. Ambulated with PT without symptoms. Monitor shows frequent PVCs. Orthostatics repeated by PT pending. She is being hydrated. Awaiting Echo results. If EF is normal could DC home with event monitor. If EF is low may need to consider cardiac cath. She is on low dose Coreg.   Peter Martinique, [place] 10/20/2014 11:36 AM

## 2014-10-20 NOTE — Progress Notes (Signed)
*  PRELIMINARY RESULTS* Echocardiogram 2D Echocardiogram has been performed.  [name] 10/20/2014, 1:57 PM

## 2014-10-20 NOTE — Progress Notes (Signed)
Utilization review completed. Adalay Azucena, RN, BSN. 

## 2014-10-20 NOTE — Progress Notes (Signed)
Spoke with Dr Martinique by phone and with patient by phone as well.  Dr Martinique wants pt to be on Coreg because of her PVCs which are symptomatic. Disscussed holding the medication due to low BP, she will check her BP at home and hold it based on the parameters put in the chart.   A message has been sent to the office regarding an event monitor and f/u appt.  Rosaria Ferries, PA-C 10/20/2014 4:57 PM Beeper [phone]

## 2014-10-22 NOTE — Progress Notes (Signed)
Late entry for missed gcode 10/20/14:   2014/11/08 0800  PT G-Codes **NOT FOR INPATIENT CLASS**  Functional Assessment Tool Used clinical judgment  Functional Limitation Mobility: Walking and moving around  Mobility: Walking and Moving Around Current Status ([id number]) CI  Mobility: Walking and Moving Around Goal Status [phone]) CI  Mobility: Walking and Moving Around Discharge Status [phone]) CI  [facility] Acute Rehabilitation [phone] (pager)

## 2014-10-24 ENCOUNTER — Ambulatory Visit: Payer: Medicare Other | Admitting: *Deleted

## 2014-10-24 DIAGNOSIS — R55 Syncope and collapse: Secondary | ICD-10-CM

## 2014-10-24 DIAGNOSIS — R002 Palpitations: Secondary | ICD-10-CM

## 2014-10-24 NOTE — Patient Instructions (Addendum)
Your physician has recommended that you wear an event monitor. Event monitors are medical devices that record the heart's electrical activity. Doctors most often [place] these monitors to diagnose arrhythmias. Arrhythmias are problems with the speed or rhythm of the heartbeat. The monitor is a small, portable device. You can wear one while you do your normal daily activities. This is usually used to diagnose what is causing palpitations/syncope (passing out).  Your Doctor has ordered you to wear a heart monitor. You will wear this for 30 days.   TIPS -  REMINDERS 1. The sensor is the lanyard that is worn around your neck every day - this is powered by a battery that needs to be changed every day 2. The monitor is the device that allows you to record symptoms - this will need to be charged daily 3. The sensor & monitor need to be within 100 feet of each other at all times 4. The sensor connects to the electrodes (stickers) - these should be changed every 24-48 hours (you do not have to remove them when you bathe, just make sure they are dry when you connect it back to the sensor 5. If you need more supplies (electrodes, batteries), please call the 1-800 # on the back of the pamphlet and [employer] will mail you more supplies 6. If your skin becomes sensitive, please try the sample pack of sensitive skin electrodes (the white packet in your silver box) and call [employer] to have them mail you more of these type of electrodes 7. When you are finish wearing the monitor, please place all supplies back in the silver box, place the silver box in the pre-packaged UPS bag and drop off at [employer] or call them so they can come pick it up   Cardiac Event Monitoring A cardiac event monitor is a small recording device used to help detect abnormal heart rhythms (arrhythmias). The monitor is used to record heart rhythm when noticeable symptoms such as the following occur:  Fast heartbeats (palpitations), such as heart racing  or fluttering.  Dizziness.  Fainting or light-headedness.  Unexplained weakness. The monitor is wired to two electrodes placed on your chest. Electrodes are flat, sticky disks that attach to your skin. The monitor can be worn for up to 30 days. You will wear the monitor at all times, except when bathing.  HOW TO USE YOUR CARDIAC EVENT MONITOR A technician will prepare your chest for the electrode placement. The technician will show you how to place the electrodes, how to work the monitor, and how to replace the batteries. Take time to practice using the monitor before you leave the office. Make sure you understand how to send the information from the monitor to your health care provider. This requires a telephone with a landline, not a cell phone. You need to:  Wear your monitor at all times, except when you are in water:  Do not get the monitor wet.  Take the monitor off when bathing. Do not swim or use a hot tub with it on.  Keep your skin clean. Do not put body lotion or moisturizer on your chest.  Change the electrodes daily or any time they stop sticking to your skin. You might need to use tape to keep them on.  It is possible that your skin under the electrodes could become irritated. To keep this from happening, try to put the electrodes in slightly different places on your chest. However, they must remain in the area under  your left breast and in the upper right section of your chest.  Make sure the monitor is safely clipped to your clothing or in a location close to your body that your health care provider recommends.  Press the button to record when you feel symptoms of heart trouble, such as dizziness, weakness, light-headedness, palpitations, thumping, shortness of breath, unexplained weakness, or a fluttering or racing heart. The monitor is always on and records what happened slightly before you pressed the button, so do not worry about being too late to get good  information.  Keep a diary of your activities, such as walking, doing chores, and taking medicine. It is especially important to note what you were doing when you pushed the button to record your symptoms. This will help your health care provider determine what might be contributing to your symptoms. The information stored in your monitor will be reviewed by your health care provider alongside your diary entries.  Send the recorded information as recommended by your health care provider. It is important to understand that it will take some time for your health care provider to process the results.  Change the batteries as recommended by your health care provider. SEEK IMMEDIATE MEDICAL CARE IF:   You have chest pain.  You have extreme difficulty breathing or shortness of breath.  You develop a very fast heartbeat that persists.  You develop dizziness that does not go away.  You faint or constantly feel you are about to faint. Document Released: 04/19/2008 Document Revised: 11/25/2013 Document Reviewed: 01/07/2013 [facility] Patient Information 2015 [place], [place]. This information is not intended to replace advice given to you by your health care provider. Make sure you discuss any questions you have with your health care provider.

## 2014-10-24 NOTE — Progress Notes (Signed)
Pt was seen today for placement of 30 day event monitor. MCOT enrollment performed by [employer], CMA. Teaching and placement of monitor, as well as troubleshooting performing by Charlotte Sanes, RN.  Pt expressed understanding w/ verbally given instructions. Additionally, she was given written instructions & [employer] assistance toll-free number.   I gave pt my office number & extension to reach out for any non-equipment issues she may have.

## 2014-10-31 ENCOUNTER — Telehealth: Payer: Self-pay | Admitting: *Deleted

## 2014-10-31 NOTE — Telephone Encounter (Signed)
[employer] requesting documentation for Event Monitor.  Faxed D/C summary of 10/20/14 and note by Rosaria Ferries, PA on 10/20/14.

## 2014-11-07 ENCOUNTER — Telehealth: Payer: Self-pay | Admitting: Cardiovascular Disease

## 2014-11-07 ENCOUNTER — Telehealth: Payer: Self-pay | Admitting: *Deleted

## 2014-11-07 NOTE — Telephone Encounter (Signed)
LM to let patient know we will be scheduling an appt for follow up of Event Monitor with the end of service being 11/22/14.  Instructed to call if she has any problems of questions.

## 2014-11-11 NOTE — Telephone Encounter (Signed)
Closed encounter °

## 2014-11-24 ENCOUNTER — Telehealth: Payer: Self-pay | Admitting: Cardiovascular Disease

## 2014-11-24 ENCOUNTER — Other Ambulatory Visit: Payer: Self-pay | Admitting: Cardiovascular Disease

## 2014-11-24 MED ORDER — CARVEDILOL 3.125 MG PO TABS: 3.1250 mg | ORAL_TABLET | Freq: Two times a day (BID) | ORAL | Status: DC

## 2014-11-24 NOTE — Telephone Encounter (Signed)
Pt wants to know if she needs to continue taking the Coreg?She have 3 more to go and she see the PA on 12-02-14.

## 2014-11-24 NOTE — Telephone Encounter (Signed)
Advised pt to continue medication,  Refill submitted to patient's preferred pharmacy. Informed patient. Pt voiced understanding, no other stated concerns at this time.

## 2014-11-26 ENCOUNTER — Other Ambulatory Visit: Payer: Self-pay

## 2014-11-26 ENCOUNTER — Encounter (INDEPENDENT_AMBULATORY_CARE_PROVIDER_SITE_OTHER): Payer: Medicare Other

## 2014-11-26 DIAGNOSIS — R55 Syncope and collapse: Secondary | ICD-10-CM

## 2014-12-02 ENCOUNTER — Encounter: Payer: Self-pay | Admitting: Cardiology

## 2014-12-02 ENCOUNTER — Telehealth: Payer: Self-pay | Admitting: Cardiovascular Disease

## 2014-12-02 ENCOUNTER — Ambulatory Visit (INDEPENDENT_AMBULATORY_CARE_PROVIDER_SITE_OTHER): Payer: Medicare Other | Admitting: Cardiology

## 2014-12-02 VITALS — BP 149/70 | HR 61 | Ht 66.0 in | Wt 159.1 lb

## 2014-12-02 DIAGNOSIS — R002 Palpitations: Secondary | ICD-10-CM | POA: Diagnosis not present

## 2014-12-02 DIAGNOSIS — R0602 Shortness of breath: Secondary | ICD-10-CM

## 2014-12-02 DIAGNOSIS — I471 Supraventricular tachycardia: Secondary | ICD-10-CM

## 2014-12-02 NOTE — Telephone Encounter (Signed)
PVCs, with falls, [employer] with PVCs but also short bursts of SVT, no a fib >> per Laura's note  Communicated this with patient. She voiced understanding. No further questions

## 2014-12-02 NOTE — Telephone Encounter (Signed)
[name] is calling in reference to visit this morning. Wanted to know if she was in Afib , because she was told that her was beating fast today on her visit .Marland Kitchen Please call .Marland Kitchen Thanks

## 2014-12-02 NOTE — Patient Instructions (Addendum)
Your physician recommends that you schedule a follow-up appointment in: 6 Weeks with Dr Sallyanne Kuster  Your physician has recommended you make the following change in your medication: Alternate Triamterene/HCTz take 1 tablets one day then 1/2 tablets next day  If you continue to fall please call office and let us know

## 2014-12-02 NOTE — Progress Notes (Signed)
Cardiology Office Note   Date:  12/02/2014   ID:  [name], DOB [birth year], MRN [id number]  PCP:  Geoffery Lyons, MD  Cardiologist:  Dr. Sallyanne Kuster    Chief Complaint  Patient presents with  . Follow-up    Monitor, pt denied chest pain and SOB      History of Present Illness: [name] is a 81 y.o. female who presents for follow up of hospital visit for falls and dehydration and orthostatic hypotension.  She had freq PVCs during the hospitalization.  She was placed on coreg 3.125 mg BID which we will continue.    She has a history of HTN, HLD, breast cancer, hx of alcoholism now in remission who presented to [facility] with SOB and fatigue, CT angio without PE, no edema on CXR.  Echo with :: - Left ventricle: The cavity size was normal. Wall thickness was normal. Systolic function was normal. The estimated ejection fraction was in the range of 55% to 60%. Wall motion was normal; there were no regional wall motion abnormalities. Doppler parameters are consistent with abnormal left ventricular relaxation (grade 1 diastolic dysfunction). - Aortic valve: There was mild regurgitation. - Mitral valve: Calcified annulus. - Left atrium: The atrium was moderately dilated. - Pulmonary arteries: Systolic pressure was mildly increased. - Pericardium, extracardiac: A trivial pericardial effusion was identified. Impressions: - Normal LV function; grade 1 diastolic dysfunction; moderate LAE; mild AI; mildly elevated pulmonary pressure; echodensity in right atrium of uncertain etiology (? prominent eustachian valve); suggest TEE to further assess.  Cardio net with occ PVC but runs of PSVT at 150-147.  These are brief bursts, and she is unaware of any arrhthymias.  She has HR at 2:00 am of 49 again brief but no brady during the day.  I do not yet have completed report.  She has had one fall on Friday.  No injury she was in the kitchen.    No chest pain and no complaints  of SOB.  Past Medical History  Diagnosis Date  . Hypertension   . High cholesterol   . Complication of anesthesia 04/18/2012    "didn't tolerate it today very well; had the shakes and very hard time w/it"  . Arthritis     "in my back"  . Osteoporosis   . Peripheral vascular disease     hx of ligation   . Breast cancer 1996    left breast cancer   . Depression 11/20/2012  . History of alcoholism     7 1/2 years clean    Past Surgical History  Procedure Laterality Date  . Knee arthroscopy  04/17/2012    Procedure: ARTHROSCOPY KNEE;  Surgeon: Rudean Haskell, MD;  Location: [place];  Service: Orthopedics;  Laterality: Right;  . I&d extremity  04/17/2012    Procedure: IRRIGATION AND DEBRIDEMENT EXTREMITY;  Surgeon: Rudean Haskell, MD;  Location: [place];  Service: Orthopedics;  Laterality: Right;  . Tonsillectomy and adenoidectomy      "I was a child"  . Posterior laminectomy / decompression lumbar spine  1980's  . Breast lumpectomy Left 1996  . Menisectomy Right 04/11/2012  . Rotator cuff surgery  Right   . Vein ligation    . Total knee arthroplasty Right 10/29/2012    Procedure: RIGHT TOTAL KNEE ARTHROPLASTY;  Surgeon: Gearlean Alf, MD;  Location: [facility];  Service: Orthopedics;  Laterality: Right;  . Kyphoplasty  2009, 2013    thorasic, lumbar  Current Outpatient Prescriptions  Medication Sig Dispense Refill  . alendronate (FOSAMAX) 70 MG tablet Take 70 mg by mouth once a week. Every Sunday.    Marland Kitchen amoxicillin (AMOXIL) 500 MG capsule FOR DENTAL USE ONLY  1  . Artificial Tear Ointment (DRY EYES OP) Apply 1 drop to eye daily as needed (dry eyes).    Marland Kitchen aspirin EC 81 MG tablet Take 81 mg by mouth daily.    . carvedilol (COREG) 3.125 MG tablet Take 1 tablet (3.125 mg total) by mouth 2 (two) times daily with a meal. 60 tablet 5  . Cholecalciferol 1000 UNITS capsule Take 2,000 Units by mouth daily. CVS VITAMIN D3 1000 UNIT CAPS    . escitalopram (LEXAPRO) 10 MG tablet Take 10 mg by  mouth every morning.     . Multiple Vitamins-Minerals (MULTIVITAMIN ADULT) TABS MULTIVITAMINS TABS    . Naproxen Sodium (ALEVE) 220 MG CAPS Take 2 capsules by mouth daily.     . simvastatin (ZOCOR) 20 MG tablet Take 20 mg by mouth daily.    Marland Kitchen triamterene-hydrochlorothiazide (MAXZIDE) 75-50 MG per tablet Take 1 tablet by mouth daily before breakfast.      No current facility-administered medications for this visit.    Allergies:   Oysters and Codeine    Social History:  The patient  reports that she has never smoked. She has never used smokeless tobacco. She reports that she does not drink alcohol or use illicit drugs.   Family History:  The patient's family history includes Anuerysm in her father; Kidney failure in her mother.    ROS:  General:no colds or fevers,  weight increase Skin:no rashes or ulcers HEENT:no blurred vision, no congestion CV:see HPI PUL:see HPI GI:no diarrhea constipation or melena, no indigestion GU:no hematuria, no dysuria MS:no joint pain, no claudication Neuro:no syncope, no lightheadedness Endo:no diabetes, no thyroid disease  Wt Readings from Last 3 Encounters:  12/02/14 159 lb 1.6 oz (72.167 kg)  10/20/14 150 lb 1.6 oz (68.085 kg)  09/05/14 155 lb (70.308 kg)     PHYSICAL EXAM: VS:  BP 149/70 mmHg  Pulse 61  Ht 5\' 6"  (1.676 m)  Wt 159 lb 1.6 oz (72.167 kg)  BMI 25.69 kg/m2 , BMI Body mass index is 25.69 kg/(m^2). General:Pleasant affect, NAD Skin:Warm and dry, brisk capillary refill HEENT:normocephalic, sclera clear, mucus membranes moist Neck:supple, no JVD, no bruits  Heart:S1S2 RRR with soft systolic murmur, no  gallup, rub or click Lungs:clear without rales, rhonchi, or wheezes KYH:CWCB, non tender, + BS, do not palpate liver spleen or masses Ext:no lower ext edema, 2+ pedal pulses, 2+ radial pulses Neuro:alert and oriented, MAE, follows commands, + facial symmetry    EKG:  EKG is NOT ordered today.    Recent Labs: 09/05/2014:  ALT 29 10/19/2014: B Natriuretic Peptide 141.2*; TSH 2.380 10/20/2014: BUN 20; Creatinine 0.83; Hemoglobin 14.1; Platelets 189; Potassium 3.5; Sodium 140    Lipid Panel No results found for: CHOL, TRIG, HDL, CHOLHDL, VLDL, LDLCALC, LDLDIRECT     Other studies Reviewed: Additional studies/ records that were reviewed today include: hospital notes, echo, labs.  cardionet strips, not yet final..   ASSESSMENT AND PLAN:  PVCs, with falls, cardionet with PVCs but also short bursts of SVT, no a fib.  Reviewed with Dr. Jerilynn Mages. Croitoru along with echo.  She will follow up with Dr. Jerilynn Mages. Croitoru in 6 weeks.   Falls, mildly orthostatic today will decrease triameterene/hctz alternating half with a whole every other day..  Dr.  M. Croitoru did not believe the short bursts of SVT were causing falls.  May be related to volume.    HLD continue statin  HTN controlled.    Current medicines are reviewed with the patient today.  The patient Has no concerns regarding medicines.  The following changes have been made:  See above Labs/ tests ordered today include:see above  Disposition:   FU:  see above  Signed, Isaiah Serge, NP  12/02/2014 11:46 AM    [facility] [place], [place], [place], [place] Phone: [phone]; Fax: [phone]

## 2015-01-12 ENCOUNTER — Telehealth (HOSPITAL_COMMUNITY): Payer: Self-pay | Admitting: Interventional Radiology

## 2015-01-12 ENCOUNTER — Other Ambulatory Visit (HOSPITAL_COMMUNITY): Payer: Self-pay | Admitting: Interventional Radiology

## 2015-01-12 DIAGNOSIS — M549 Dorsalgia, unspecified: Secondary | ICD-10-CM

## 2015-01-12 DIAGNOSIS — W19XXXA Unspecified fall, initial encounter: Secondary | ICD-10-CM

## 2015-01-12 DIAGNOSIS — IMO0002 Reserved for concepts with insufficient information to code with codable children: Secondary | ICD-10-CM

## 2015-01-12 NOTE — Telephone Encounter (Signed)
Called pt, left VM for her to call me back to schedule her MRI L spine. JM

## 2015-01-12 NOTE — Telephone Encounter (Signed)
Spoke to about her MRI authorization. She wanted to know if we had gotten the MRI approved yet. I told her that I had sent in additional clinical information that her insurance was requesting and we were awaiting an answer. I told her that I would call her as soon as I knew something. She was in agreement with that. JM

## 2015-01-14 ENCOUNTER — Ambulatory Visit (INDEPENDENT_AMBULATORY_CARE_PROVIDER_SITE_OTHER): Payer: Medicare Other | Admitting: Cardiovascular Disease

## 2015-01-14 ENCOUNTER — Ambulatory Visit (HOSPITAL_COMMUNITY)
Admission: RE | Admit: 2015-01-14 | Discharge: 2015-01-14 | Disposition: A | Discharge status: Home / Self Care | Payer: Medicare Other | Source: Ambulatory Visit | Attending: Interventional Radiology | Admitting: Interventional Radiology

## 2015-01-14 VITALS — BP 132/70 | HR 65 | Ht 66.0 in | Wt 158.0 lb

## 2015-01-14 DIAGNOSIS — R55 Syncope and collapse: Secondary | ICD-10-CM | POA: Diagnosis not present

## 2015-01-14 DIAGNOSIS — M4806 Spinal stenosis, lumbar region: Secondary | ICD-10-CM | POA: Insufficient documentation

## 2015-01-14 DIAGNOSIS — I10 Essential (primary) hypertension: Secondary | ICD-10-CM

## 2015-01-14 DIAGNOSIS — M5136 Other intervertebral disc degeneration, lumbar region: Secondary | ICD-10-CM | POA: Insufficient documentation

## 2015-01-14 DIAGNOSIS — IMO0002 Reserved for concepts with insufficient information to code with codable children: Secondary | ICD-10-CM

## 2015-01-14 DIAGNOSIS — W19XXXA Unspecified fall, initial encounter: Secondary | ICD-10-CM | POA: Insufficient documentation

## 2015-01-14 DIAGNOSIS — M5127 Other intervertebral disc displacement, lumbosacral region: Secondary | ICD-10-CM | POA: Diagnosis not present

## 2015-01-14 DIAGNOSIS — M549 Dorsalgia, unspecified: Secondary | ICD-10-CM | POA: Diagnosis present

## 2015-01-14 DIAGNOSIS — M4856XA Collapsed vertebra, not elsewhere classified, lumbar region, initial encounter for fracture: Secondary | ICD-10-CM | POA: Insufficient documentation

## 2015-01-14 DIAGNOSIS — I951 Orthostatic hypotension: Secondary | ICD-10-CM

## 2015-01-14 MED ORDER — TRIAMTERENE-HCTZ 37.5-25 MG PO TABS: 1.0000 | ORAL_TABLET | Freq: Every day | ORAL | Status: DC

## 2015-01-14 NOTE — Patient Instructions (Signed)
Medication Instructions:   DECREASE TRIAMTERENE TO 37.5/25MG  DAILY   Follow-Up:  ONE YEAR

## 2015-01-15 NOTE — Progress Notes (Signed)
Patient ID: KHALIYAH NORTHROP, female   DOB: 12-28-1931, 81 y.o.   MRN: [id number]     Cardiology Office Note   Date:  01/16/2015   ID:  DALEIZA BACCHI, DOB 1932/07/11, MRN [id number]  PCP:  Geoffery Lyons, MD  Cardiologist:   Sanda Klein, MD   Chief Complaint  Patient presents with  . Follow-up    c/o SOB and a little swelling in feet      History of Present Illness: KESHARA KIGER is a 81 y.o. female who presents for  Follow-up for essential hypertension and orthostatic hypotension complicated by near syncope. She has generally been doing well and reports that at home her blood pressures are consistently in the 120-140/60 range. In our office her blood pressure was initially high at 162/80 but became frankly normal after just 15 minutes of waiting. She has had one fall that she insists was a "trip" on irregular concrete. She has not had dizziness or syncope. Ankle swelling has been an intermittent mild problem. She has been alternating between a whole and a half tablet of her diuretic (triamterene-hydrochlorothiazide 75-50 mg).  Overall she feels well.  Her event monitor showed occasional PVCs and brief runs of paroxysmal atrial tachycardia at about 150 beats per minute which were asymptomatic. She did not have any episodes of meaningful bradycardia during the day and had physiological mild bradycardia at night. No arrhythmia that would be expected to cause near syncope or syncope was recorded even though she did have a fall while wearing the monitor.  Low-dose beta blockers were prescribed for her arrhythmic findings and she is tolerating them well so far.  Her echocardiogram was essentially a normal study with the exception of moderate left atrial enlargement and mild diastolic dysfunction.  The reported echodensity in her right atrium indeed appears to simply be a prominent eustachian valve. Past Medical History  Diagnosis Date  . Hypertension   . High cholesterol   . Complication  of anesthesia 04/18/2012    "didn't tolerate it today very well; had the shakes and very hard time w/it"  . Arthritis     "in my back"  . Osteoporosis   . Peripheral vascular disease     hx of ligation   . Breast cancer 1996    left breast cancer   . Depression 11/20/2012  . History of alcoholism     7 1/2 years clean    Past Surgical History  Procedure Laterality Date  . Knee arthroscopy  04/17/2012    Procedure: ARTHROSCOPY KNEE;  Surgeon: Rudean Haskell, MD;  Location: [place];  Service: Orthopedics;  Laterality: Right;  . I&d extremity  04/17/2012    Procedure: IRRIGATION AND DEBRIDEMENT EXTREMITY;  Surgeon: Rudean Haskell, MD;  Location: [facility];  Service: Orthopedics;  Laterality: Right;  . Tonsillectomy and adenoidectomy      "I was a child"  . Posterior laminectomy / decompression lumbar spine  1980's  . Breast lumpectomy Left 1996  . Menisectomy Right 04/11/2012  . Rotator cuff surgery  Right   . Vein ligation    . Total knee arthroplasty Right 10/29/2012    Procedure: RIGHT TOTAL KNEE ARTHROPLASTY;  Surgeon: Gearlean Alf, MD;  [facility];  Service: Orthopedics;  Laterality: Right;  . Kyphoplasty  2009, 2013    thorasic, lumbar     Current Outpatient Prescriptions  Medication Sig Dispense Refill  . alendronate (FOSAMAX) 70 MG tablet Take 70 mg by mouth once a week.  Every Sunday.    Marland Kitchen amoxicillin (AMOXIL) 500 MG capsule FOR DENTAL USE ONLY  1  . Artificial Tear Ointment (DRY EYES OP) Apply 1 drop to eye daily as needed (dry eyes).    Marland Kitchen aspirin EC 81 MG tablet Take 81 mg by mouth daily.    . carvedilol (COREG) 3.125 MG tablet Take 1 tablet (3.125 mg total) by mouth 2 (two) times daily with a meal. 60 tablet 5  . Cholecalciferol 1000 UNITS capsule Take 2,000 Units by mouth daily. CVS VITAMIN D3 1000 UNIT CAPS    . escitalopram (LEXAPRO) 10 MG tablet Take 10 mg by mouth every morning.     . Multiple Vitamins-Minerals (MULTIVITAMIN ADULT) TABS MULTIVITAMINS TABS    .  Naproxen Sodium (ALEVE) 220 MG CAPS Take 2 capsules by mouth daily.     . simvastatin (ZOCOR) 20 MG tablet Take 20 mg by mouth daily.    Marland Kitchen triamterene-hydrochlorothiazide (MAXZIDE-25) 37.5-25 MG per tablet Take 1 tablet by mouth daily. 90 tablet 3   No current facility-administered medications for this visit.    Allergies:   Oysters and Codeine    Social History:  The patient  reports that she has never smoked. She has never used smokeless tobacco. She reports that she does not drink alcohol or use illicit drugs.   Family History:  The patient's family history includes Anuerysm in her father; Kidney failure in her mother.    ROS:  Please see the history of present illness.    Otherwise, review of systems positive for none.   All other systems are reviewed and negative.    PHYSICAL EXAM: VS:  BP 132/70 mmHg  Pulse 65  Ht 5\' 6"  (1.676 m)  Wt 158 lb (71.668 kg)  BMI 25.51 kg/m2 , BMI Body mass index is 25.51 kg/(m^2).  General: Alert, oriented x3, no distress Head: no evidence of trauma, PERRL, EOMI, no exophtalmos or lid lag, no myxedema, no xanthelasma; normal ears, nose and oropharynx Neck: normal jugular venous pulsations and no hepatojugular reflux; brisk carotid pulses without delay and no carotid bruits Chest: clear to auscultation, no signs of consolidation by percussion or palpation, normal fremitus, symmetrical and full respiratory excursions Cardiovascular: normal position and quality of the apical impulse, regular rhythm, normal first and second heart sounds, no murmurs, rubs or gallops Abdomen: no tenderness or distention, no masses by palpation, no abnormal pulsatility or arterial bruits, normal bowel sounds, no hepatosplenomegaly Extremities: no clubbing, cyanosis or edema; 2+ radial, ulnar and brachial pulses bilaterally; 2+ right femoral, posterior tibial and dorsalis pedis pulses; 2+ left femoral, posterior tibial and dorsalis pedis pulses; no subclavian or femoral  bruits Neurological: grossly nonfocal Psych: euthymic mood, full affect   EKG:  EKG is not ordered today.   Recent Labs: 09/05/2014: ALT 29 10/19/2014: B Natriuretic Peptide 141.2*; TSH 2.380 10/20/2014: BUN 20; Creatinine, Ser 0.83; Hemoglobin 14.1; Platelets 189; Potassium 3.5; Sodium 140    Lipid Panel No results found for: CHOL, TRIG, HDL, CHOLHDL, VLDL, LDLCALC, LDLDIRECT    Wt Readings from Last 3 Encounters:  01/14/15 158 lb (71.668 kg)  12/02/14 159 lb 1.6 oz (72.167 kg)  10/20/14 150 lb 1.6 oz (68.085 kg)      ASSESSMENT AND PLAN:   Mrs. Mavis  Does not appear to have significant structural heart disease or meaningful arrhythmia that could cause syncope. She does seem to be prone to falls and has a rather unsteady gait at times. At this point do not plan  additional cardiac workup. I did recommend that she reduce her dose of diuretic to 37.5/25 mg daily to lessen the likelihood of dehydration and hypotension. She would have the discretion to occasionally increase to a full tablet if her lower extremity edema should worsen. Her palpitations are well controlled on a very low dose of beta blocker. In the absence of meaningful structural heart disease these are not likely to be of clinical adverse significance.  Current medicines are reviewed at length with the patient today.  The patient does not have concerns regarding medicines.  The following changes have been made:   Reduce triamterene/hydrochlorothiazide to 37.5/25 mg daily.  If necessary for edema she may take a full tablet occasionally.  Labs/ tests ordered today include:  No orders of the defined types were placed in this encounter.   Patient Instructions  Medication Instructions:   DECREASE TRIAMTERENE TO 37.5/25MG  DAILY   Follow-Up:  ONE YEAR       SignedSanda Klein, MD  01/16/2015 5:44 PM    Sanda Klein, MD, [facility] [phone] office [phone] pager

## 2015-01-16 ENCOUNTER — Telehealth (HOSPITAL_COMMUNITY): Payer: Self-pay | Admitting: Interventional Radiology

## 2015-01-16 ENCOUNTER — Encounter: Payer: Self-pay | Admitting: Cardiovascular Disease

## 2015-01-16 NOTE — Telephone Encounter (Signed)
Pt called in to see if we had received approval from her insurance company for her L4 KP/VP. I told her that we had not yet gotten anything back from them. She wanted to schedule this procedure for this coming Monday (01/19/15). I told her that we could schedule this but if we did not have the authorization by then we would either need to cancel it or she would be considered a self-pay. She wanted to know what I could do to find out where the approval stood at this time. I faxed a STAT pre-cert request yesterday (01/15/15) at 0826 to [facility] and have not heard anything back from her on this. Most insurance companies take more time for authorization unless they simply do not require authorization. I gave the patient all of that information. She still wanted to know if there was not something I could to do to check on the status. Waylan Boga is off today (01/16/15). I told the patient I would call her insurance company myself and see what I could find out about her case. I called [employer], they informed me that these such requests must have prior authorization and must go through [place] for approval. I then was transferred to [facility] and spoke with Angelica. Angelica informed me that no request for this patient has been made yet. She informed me that an [employer] form must be filled out and faxed in along with clinical information to support the request and once they received the request via fax they had up to 3 business days to make a determination. I asked her to fax me a copy of the necessary form that needed to be filled out. She did fax me the form, I filled this out, attached all clinical information, and faxed this back in to [place]. Called the patient back and gave her all of this above information. I will call her back immediately once I have received her insurance companies decision. She is in agreement with this plan of care. JM

## 2015-01-21 ENCOUNTER — Other Ambulatory Visit (HOSPITAL_COMMUNITY): Payer: Self-pay | Admitting: Interventional Radiology

## 2015-01-21 ENCOUNTER — Ambulatory Visit (HOSPITAL_COMMUNITY)
Admission: RE | Admit: 2015-01-21 | Discharge: 2015-01-21 | Disposition: A | Discharge status: Home / Self Care | Payer: Medicare Other | Source: Ambulatory Visit | Attending: Interventional Radiology | Admitting: Interventional Radiology

## 2015-01-21 DIAGNOSIS — M549 Dorsalgia, unspecified: Secondary | ICD-10-CM

## 2015-01-21 DIAGNOSIS — IMO0002 Reserved for concepts with insufficient information to code with codable children: Secondary | ICD-10-CM

## 2015-01-22 ENCOUNTER — Telehealth (HOSPITAL_COMMUNITY): Payer: Self-pay | Admitting: Interventional Radiology

## 2015-01-22 ENCOUNTER — Other Ambulatory Visit: Payer: Self-pay | Admitting: Radiology

## 2015-01-22 ENCOUNTER — Other Ambulatory Visit (HOSPITAL_COMMUNITY): Payer: Self-pay | Admitting: Interventional Radiology

## 2015-01-22 DIAGNOSIS — S32040A Wedge compression fracture of fourth lumbar vertebra, initial encounter for closed fracture: Secondary | ICD-10-CM

## 2015-01-22 DIAGNOSIS — W19XXXA Unspecified fall, initial encounter: Secondary | ICD-10-CM

## 2015-01-22 DIAGNOSIS — M549 Dorsalgia, unspecified: Secondary | ICD-10-CM

## 2015-01-22 NOTE — Telephone Encounter (Signed)
Called pt's husband to let him know that his wife's insurance company has denied authorization for our request of a kyphoplasty procedure for the patient's L4 vertebral body compression fracture. I told him that I would see if we could not have Deveshwar do a peer-to-peer call with the insurance company's physician who denied this procedure and have the denial overturned. The pt's husband would like for [place] to try to have the decision overturned but would like to schedule the procedure for tomorrow either way. He states that his wife cannot continue in this kind of pain any longer. JM

## 2015-01-23 ENCOUNTER — Encounter (HOSPITAL_COMMUNITY): Payer: Self-pay

## 2015-01-23 ENCOUNTER — Ambulatory Visit (HOSPITAL_COMMUNITY)
Admission: RE | Admit: 2015-01-23 | Discharge: 2015-01-23 | Disposition: A | Discharge status: Home / Self Care | Payer: Medicare Other | Source: Ambulatory Visit | Attending: Interventional Radiology | Admitting: Interventional Radiology

## 2015-01-23 DIAGNOSIS — W19XXXA Unspecified fall, initial encounter: Secondary | ICD-10-CM

## 2015-01-23 DIAGNOSIS — F329 Major depressive disorder, single episode, unspecified: Secondary | ICD-10-CM | POA: Insufficient documentation

## 2015-01-23 DIAGNOSIS — M545 Low back pain: Secondary | ICD-10-CM | POA: Insufficient documentation

## 2015-01-23 DIAGNOSIS — Z7982 Long term (current) use of aspirin: Secondary | ICD-10-CM | POA: Diagnosis not present

## 2015-01-23 DIAGNOSIS — E780 Pure hypercholesterolemia: Secondary | ICD-10-CM | POA: Diagnosis not present

## 2015-01-23 DIAGNOSIS — I739 Peripheral vascular disease, unspecified: Secondary | ICD-10-CM | POA: Diagnosis not present

## 2015-01-23 DIAGNOSIS — I10 Essential (primary) hypertension: Secondary | ICD-10-CM | POA: Insufficient documentation

## 2015-01-23 DIAGNOSIS — Z9181 History of falling: Secondary | ICD-10-CM | POA: Insufficient documentation

## 2015-01-23 DIAGNOSIS — Z853 Personal history of malignant neoplasm of breast: Secondary | ICD-10-CM | POA: Insufficient documentation

## 2015-01-23 DIAGNOSIS — S32040A Wedge compression fracture of fourth lumbar vertebra, initial encounter for closed fracture: Secondary | ICD-10-CM | POA: Insufficient documentation

## 2015-01-23 DIAGNOSIS — M810 Age-related osteoporosis without current pathological fracture: Secondary | ICD-10-CM | POA: Diagnosis not present

## 2015-01-23 DIAGNOSIS — M4856XA Collapsed vertebra, not elsewhere classified, lumbar region, initial encounter for fracture: Secondary | ICD-10-CM | POA: Insufficient documentation

## 2015-01-23 DIAGNOSIS — M1990 Unspecified osteoarthritis, unspecified site: Secondary | ICD-10-CM | POA: Diagnosis not present

## 2015-01-23 DIAGNOSIS — M549 Dorsalgia, unspecified: Secondary | ICD-10-CM

## 2015-01-23 LAB — BASIC METABOLIC PANEL
ANION GAP: 10 (ref 5–15)
BUN: 37 mg/dL — AB (ref 6–20)
CALCIUM: 9.6 mg/dL (ref 8.9–10.3)
CO2: 28 mmol/L (ref 22–32)
CREATININE: 0.84 mg/dL (ref 0.44–1.00)
Chloride: 101 mmol/L (ref 101–111)
GFR calc Af Amer: >60 mL/min (ref >60)
GFR calc non Af Amer: >60 mL/min (ref >60)
Glucose, Bld: 102 mg/dL — ABNORMAL HIGH (ref 65–99)
Potassium: 3.6 mmol/L (ref 3.5–5.1)
Sodium: 139 mmol/L (ref 135–145)

## 2015-01-23 LAB — PROTIME-INR
INR: 0.97 (ref 0.00–1.49)
PROTHROMBIN TIME: 13.1 s (ref 11.6–15.2)

## 2015-01-23 LAB — CBC
HCT: 39.5 % (ref 36.0–46.0)
Hemoglobin: 13.2 g/dL (ref 12.0–15.0)
MCH: 31.4 pg (ref 26.0–34.0)
MCHC: 33.4 g/dL (ref 30.0–36.0)
MCV: 94 fL (ref 78.0–100.0)
PLATELETS: 212 10*3/uL (ref 150–400)
RBC: 4.2 MIL/uL (ref 3.87–5.11)
RDW: 13.7 % (ref 11.5–15.5)
WBC: 7.5 10*3/uL (ref 4.0–10.5)

## 2015-01-23 MED ORDER — FENTANYL CITRATE (PF) 100 MCG/2ML IJ SOLN
INTRAMUSCULAR | Status: AC
  Filled 2015-01-23: qty 4

## 2015-01-23 MED ORDER — FENTANYL CITRATE (PF) 100 MCG/2ML IJ SOLN
INTRAMUSCULAR | Status: AC | PRN
  Administered 2015-01-23 (×3): 25 ug via INTRAVENOUS
  Administered 2015-01-23: 12.5 ug via INTRAVENOUS

## 2015-01-23 MED ORDER — BUPIVACAINE HCL (PF) 0.25 % IJ SOLN
INTRAMUSCULAR | Status: AC
  Filled 2015-01-23: qty 30

## 2015-01-23 MED ORDER — MIDAZOLAM HCL 2 MG/2ML IJ SOLN
INTRAMUSCULAR | Status: AC
  Filled 2015-01-23: qty 4

## 2015-01-23 MED ORDER — HYDROCODONE-ACETAMINOPHEN 5-325 MG PO TABS
1.0000 | ORAL_TABLET | Freq: Once | ORAL | Status: AC
  Administered 2015-01-23: 1 via ORAL

## 2015-01-23 MED ORDER — MIDAZOLAM HCL 2 MG/2ML IJ SOLN
INTRAMUSCULAR | Status: AC | PRN
Start: 2015-01-23 — End: 2015-01-23
  Administered 2015-01-23: 1 mg via INTRAVENOUS
  Administered 2015-01-23: 0.5 mg via INTRAVENOUS
  Administered 2015-01-23: 1 mg via INTRAVENOUS

## 2015-01-23 MED ORDER — HYDROMORPHONE HCL 1 MG/ML IJ SOLN
INTRAMUSCULAR | Status: AC
  Filled 2015-01-23: qty 1

## 2015-01-23 MED ORDER — SODIUM CHLORIDE 0.9 % IV SOLN
Freq: Once | INTRAVENOUS | Status: AC
  Administered 2015-01-23: 11:00:00 via INTRAVENOUS

## 2015-01-23 MED ORDER — HYDROCODONE-ACETAMINOPHEN 5-325 MG PO TABS
ORAL_TABLET | ORAL | Status: AC
  Administered 2015-01-23: 1 via ORAL
  Filled 2015-01-23: qty 1

## 2015-01-23 MED ORDER — SODIUM CHLORIDE 0.9 % IV SOLN
INTRAVENOUS | Status: AC
  Administered 2015-01-23: 15:00:00 via INTRAVENOUS

## 2015-01-23 MED ORDER — IOHEXOL 300 MG/ML  SOLN
50.0000 mL | Freq: Once | INTRAMUSCULAR | Status: AC | PRN
  Administered 2015-01-23: 1 mL

## 2015-01-23 MED ORDER — TOBRAMYCIN SULFATE 1.2 G IJ SOLR
INTRAMUSCULAR | Status: AC
  Filled 2015-01-23: qty 1.2

## 2015-01-23 MED ORDER — CEFAZOLIN SODIUM-DEXTROSE 2-3 GM-% IV SOLR
2.0000 g | Freq: Once | INTRAVENOUS | Status: AC
  Administered 2015-01-23: 2 g via INTRAVENOUS
  Filled 2015-01-23: qty 50

## 2015-01-23 NOTE — Discharge Instructions (Signed)
1.No stooping,bending or lifting more than 10 lbs for 2 weeks. 2.Uswe walker to ambulate for 2 weeks. 3.RTC in 2 weeks  KYPHOPLASTY/VERTEBROPLASTY DISCHARGE INSTRUCTIONS  Medications: (check all that apply)     Resume all home medications as before procedure.       Resume your (aspirin/Plavix/Coumadin) on .                  Continue your pain medications as prescribed as needed.  Over the next 3-5 days, decrease your pain medication as tolerated.  Over the counter medications (i.e. Tylenol, ibuprofen, and aleve) may be substituted once severe/moderate pain symptoms have subsided.   Wound Care: - Bandages may be removed the day following your procedure.  You may get your incision wet once bandages are removed.  Bandaids may be used to cover the incisions until scab formation.  Topical ointments are optional.  - If you develop a fever greater than 101 degrees, have increased skin redness at the incision sites or pus-like oozing from incisions occurring within 1 week of the procedure, contact radiology at [phone] or [phone].  - Ice pack to back for 15-20 minutes 2-3 time per day for first 2-3 days post procedure.  The ice will expedite muscle healing and help with the pain from the incisions.   Activity: - Bedrest today with limited activity for 24 hours post procedure.  - No driving for 48 hours.  - Increase your activity as tolerated after bedrest (with assistance if necessary).  - Refrain from any strenuous activity or heavy lifting (greater than 10 lbs.).   Follow up: - Contact radiology at [phone] or [phone] if any questions/concerns.  - A physician assistant from radiology will contact you in approximately 1 week.  - If a biopsy was performed at the time of your procedure, your referring physician should receive the results in usually 2-3 days.

## 2015-01-23 NOTE — H&P (Signed)
Chief Complaint:  Intractable back pain  Referring Physician(s): Dr Doy Mince  History of Present Illness: [name] is a 81 y.o. female   Pt fell at home Intractable back pain for few weeks MRI 6/22 does reveal acute Lumbar 4 fracture Request for Vertebroplasty/kyphoplasty Hx L1 KP 03/2008 and L2 KP 01/2011---both with good relief   Past Medical History  Diagnosis Date  . Hypertension   . High cholesterol   . Complication of anesthesia 04/18/2012    "didn't tolerate it today very well; had the shakes and very hard time w/it"  . Arthritis     "in my back"  . Osteoporosis   . Peripheral vascular disease     hx of ligation   . Breast cancer 1996    left breast cancer   . Depression 11/20/2012  . History of alcoholism     7 1/2 years clean    Past Surgical History  Procedure Laterality Date  . Knee arthroscopy  04/17/2012    Procedure: ARTHROSCOPY KNEE;  Surgeon: Rudean Haskell, MD;  Location: [facility];  Service: Orthopedics;  Laterality: Right;  . I&d extremity  04/17/2012    Procedure: IRRIGATION AND DEBRIDEMENT EXTREMITY;  Surgeon: Rudean Haskell, MD;  Location: [facility];  Service: Orthopedics;  Laterality: Right;  . Tonsillectomy and adenoidectomy      "I was a child"  . Posterior laminectomy / decompression lumbar spine  1980's  . Breast lumpectomy Left 1996  . Menisectomy Right 04/11/2012  . Rotator cuff surgery  Right   . Vein ligation    . Total knee arthroplasty Right 10/29/2012    Procedure: RIGHT TOTAL KNEE ARTHROPLASTY;  Surgeon: Gearlean Alf, MD;  Location: [facility];  Service: Orthopedics;  Laterality: Right;  . Kyphoplasty  2009, 2013    thorasic, lumbar    Allergies: Oysters and Codeine  Medications: Prior to Admission medications   Medication Sig Start Date End Date Taking? Authorizing Provider  alendronate (FOSAMAX) 70 MG tablet Take 70 mg by mouth once a week. Every Sunday. 08/21/13   Historical Provider, MD  amoxicillin (AMOXIL) 500 MG capsule  FOR DENTAL USE ONLY 11/16/14   Historical Provider, MD  Artificial Tear Ointment (DRY EYES OP) Apply 1 drop to eye daily as needed (dry eyes).    Historical Provider, MD  aspirin EC 81 MG tablet Take 81 mg by mouth daily. 09/04/13   Historical Provider, MD  carvedilol (COREG) 3.125 MG tablet Take 1 tablet (3.125 mg total) by mouth 2 (two) times daily with a meal. 11/24/14   Mihai Croitoru, MD  Cholecalciferol 1000 UNITS capsule Take 2,000 Units by mouth daily. [facility] VITAMIN D3 1000 UNIT CAPS 10/19/11   Historical Provider, MD  escitalopram (LEXAPRO) 10 MG tablet Take 10 mg by mouth every morning.     Historical Provider, MD  Multiple Vitamins-Minerals (MULTIVITAMIN ADULT) TABS MULTIVITAMINS TABS 08/17/09   Historical Provider, MD  Naproxen Sodium (ALEVE) 220 MG CAPS Take 2 capsules by mouth daily.     Historical Provider, MD  simvastatin (ZOCOR) 20 MG tablet Take 20 mg by mouth daily. 03/30/10   Historical Provider, MD  triamterene-hydrochlorothiazide (MAXZIDE-25) 37.5-25 MG per tablet Take 1 tablet by mouth daily. 01/14/15   Sanda Klein, MD     Family History  Problem Relation Age of Onset  . Kidney failure Mother   . Anuerysm Father     AAA    History   Social History  . Marital Status: Married  Spouse Name: N/A  . Number of Children: 2  . Years of Education: N/A   Occupational History  . retired    Social History Main Topics  . Smoking status: Never Smoker   . Smokeless tobacco: Never Used  . Alcohol Use: No     Comment: 04/18/2012 "couple drinks of vodka/day til ~ 6 yr ago; nothing in the last 6 yrs"  . Drug Use: No  . Sexual Activity: Yes    Birth Control/ Protection: None   Other Topics Concern  . None   Social History Narrative     Review of Systems: A 12 point ROS discussed and pertinent positives are indicated in the HPI above.  All other systems are negative.  Review of Systems  Constitutional: Positive for activity change. Negative for fever and fatigue.    Respiratory: Negative for cough.   Cardiovascular: Negative for chest pain.  Gastrointestinal: Negative for abdominal pain.  Musculoskeletal: Positive for back pain and gait problem.  Neurological: Positive for weakness.  Psychiatric/Behavioral: Negative for behavioral problems and confusion.    Vital Signs: BP 127/55 mmHg  Pulse 65  Temp(Src) 98.1 F (36.7 C)  Ht 5\' 6"  (1.676 m)  Wt 155 lb (70.308 kg)  BMI 25.03 kg/m2  SpO2 95%  Physical Exam  Constitutional: She appears well-nourished.  Cardiovascular: Normal rate, regular rhythm and normal heart sounds.   No murmur heard. Pulmonary/Chest: Effort normal and breath sounds normal. No respiratory distress.  Abdominal: Soft. Bowel sounds are normal. She exhibits no distension.  Musculoskeletal: Normal range of motion.  Low back pain  Neurological: She is alert.  Skin: Skin is warm and dry.  Psychiatric: She has a normal mood and affect. Her behavior is normal. Judgment and thought content normal.  Nursing note and vitals reviewed.   Mallampati Score:  MD Evaluation Airway: WNL Heart: WNL Abdomen: WNL Chest/ Lungs: WNL ASA  Classification: 3 Mallampati/Airway Score: One  Imaging: Mr Lumbar Spine Wo Contrast  01/14/2015   CLINICAL DATA:  Fall 01/03/2015 with back and right leg pain. Remote lumbar laminectomy. History of lumbar spine compression fractures status post augmentation in 2009 and 2012.  EXAM: MRI LUMBAR SPINE WITHOUT CONTRAST  TECHNIQUE: Multiplanar, multisequence MR imaging of the lumbar spine was performed. No intravenous contrast was administered.  COMPARISON:  01/27/2011  FINDINGS: There is slight exaggeration of the normal lumbar lordosis. Slight retrolisthesis of L5 on S1 is unchanged. Chronic L1 compression fracture is again noted status post vertebral augmentation and with slight retropulsion, unchanged. Since the prior MRI, there has been interval augmentation of the L2 compression fracture described on  that study without significant progressive vertebral body height loss. There is a new L4 superior endplate compression fracture with approximately 30% height loss and moderate marrow edema. There is no retropulsion. L3 and L5 vertebral body heights are preserved.  There is diffuse lumbar disc desiccation. Mild disc space narrowing is again seen at L2-3 and L5-S1 with associated mild degenerative endplate changes. Conus medullaris is normal in signal and terminates at L2. Paraspinal soft tissues are unremarkable.  T12-L1: Mild spinal stenosis associated with L1 retropulsion, unchanged.  L1-2: Mild disc bulging and mild facet hypertrophy without significant stenosis, similar to prior.  L2-3: Mild disc bulging and facet ligamentum flavum hypertrophy result in new, mild spinal stenosis. No significant neural foraminal stenosis.  L3-4: Mild disc bulging and moderate facet and ligamentum flavum hypertrophy result in increased, moderate spinal stenosis without significant neural foraminal stenosis.  L4-5: Minimal disc  bulging and moderate facet and ligamentum flavum hypertrophy result in increased, moderate spinal stenosis without significant neural foraminal stenosis.  L5-S1: Small right central disc protrusion results in mild right lateral recess stenosis potentially affecting the right S1 nerve root, unchanged. No spinal stenosis. Disc bulging and endplate spurring result in mild right neural foraminal stenosis, unchanged.  IMPRESSION: 1. Acute, mild L4 superior endplate compression fracture. 2. Chronic L1 and L2 compression fracture status post augmentation. 3. Mildly progressive lumbar disc and facet degeneration with moderate spinal stenosis at L3-4 and L4-5.   Electronically Signed   By: Logan Bores   On: 01/14/2015 09:41   Ir Radiologist Eval & Mgmt  01/22/2015   EXAM: ESTABLISHED PATIENT OFFICE VISIT  CHIEF COMPLAINT: New onset severe low back pain.  Compression fracture at L4.  Current Pain Level: 1-10   HISTORY OF PRESENT ILLNESS: Patient is an 81 year old, right-handed lady who presents for evaluation of severe pain in the low back region due to its recently discovered compression fracture at L4.  The patient is accompanied by her husband.  According to the history, the patient was doing okay until 2 weeks ago when she fell. Upon recovery she realized that she has severe pain in the low back region and also radiating into her thighs anteriorly bilaterally. She describes this as being intermittent. The pain is especially worse graded a 9-10 out of 10 on standing and walking.  The patient reports mild relief with Tylenol extra-strength which she takes every 3-4 hours.  She denies any radiation of this pain in a radicular manner into her lower extremities to involve the toes.  Patient reports no autonomic dysfunction of her bowel or bladder activities. She denies any urinary tract symptoms of dysuria, frequency of micturition, or of hematuria. She denies any recent chills or fever or rigors. Her weight is steady with a good appetite. She is able to ambulate with a walker presently. Prior to the fall patient was ambulating independently and visiting the gym.  Her remaining review of systems is essentially negative for pathologic symptomatology.  Past Medical History of arthritis, carcinoma. High blood pressure.  Past surgical history: Vein ligations. Lumbar laminectomy in 1983. Lumpectomy left breast 1996. Knee replacement in 2014.  Medications: Alendronate. Aspirin 81 mg a day. Cholecalciferol. Lexapro. Multivitamins. Carvedilol 3.125 mg twice a day. Naproxen as needed. Hydrochlorothiazide.  Allergies to oysters which cause nausea and vomiting. Codeine causes nausea and vomiting.  PHYSICAL EXAMINATION: On examination, patient appears mild-to-moderately distress on account of her pain.  Affect otherwise unremarkable.  Neurologically alert, awake, oriented to time, place, space. Neurologically no gross lateralizing  cranial nerve abnormalities, or motor or sensory difficulties. Station and gait not tested.  Patient exhibits significant tenderness to deep palpation in the lower lumbar region in the region of L4 slightly off to the right of midline.  ASSESSMENT AND PLAN: The patient's most recent MRI scan depicts a compression fracture at L4 which is acute to subacute. This corresponds to her clinical symptoms and also to her examination.  In view of the above history and examination and imaging findings, the option of vertebral body augmentation in the form of a vertebroplasty/kyphoplasty was discussed with the patient and her husband. The procedure, the risks, the benefits and the alternatives were discussed in detail. Questions were answered to their satisfaction.  The patient wants to proceed with vertebral body augmentation to prevent collapse and also more importantly for pain relief.  This will be scheduled at the earliest possible.  In the meantime patient has been asked to continue using support such as a walker when walking. She has been advised to avoid stooping or bending or lifting anything above 10 lb. She is also advised not to drive.  The patient and her husband leave with good understanding and agreement with above management plan.   Electronically Signed   By: Luanne Bras M.D.   On: 01/21/2015 13:01    Labs:  CBC:  Recent Labs  09/05/14 0730 10/19/14 1115 10/19/14 1817 10/20/14 0533  WBC 8.5 6.7 6.9 5.7  HGB 11.9* 14.7 14.2 14.1  HCT 36.1 44.3 41.5 42.6  PLT 187 232 217 189    COAGS: No results for input(s): INR, APTT in the last 8760 hours.  BMP:  Recent Labs  06/10/14 2142 09/05/14 0730 10/19/14 1115 10/19/14 1817 10/20/14 0533  NA 136* 135 140  --  140  K 4.0 4.2 3.7  --  3.5  CL 96 101 101  --  105  CO2 28 28 28   --  25  GLUCOSE 93 124* 97  --  86  BUN 33* 18 23  --  20  CALCIUM 9.9 8.5 10.0  --  9.5  CREATININE 1.00 0.72 0.98 0.98 0.83  GFRNONAA 52* 79* 53* 53*  65*  GFRAA 60* >90 61* 61* 75*    LIVER FUNCTION TESTS:  Recent Labs  09/05/14 0730  BILITOT 1.1  AST 58*  ALT 29  ALKPHOS 58  PROT 6.5  ALBUMIN 3.6    TUMOR MARKERS: No results for input(s): AFPTM, CEA, CA199, CHROMGRNA in the last 8760 hours.  Assessment and Plan:  Low back pain worsening x few weeks Fell at home MRI shows acute L4 fx Now scheduled for vertebroplasty/kyphoplasty Risks and Benefits discussed with the patient including, but not limited to education regarding the natural healing process of compression fractures without intervention, bleeding, infection, cement migration which may cause spinal cord damage, paralysis, pulmonary embolism or even death. All of the patient's questions were answered, patient is agreeable to proceed. Consent signed and in chart.    Thank you for this interesting consult.  I greatly enjoyed meeting [name] and look forward to participating in their care.  Signed: Deserae Jennings A 01/23/2015, 11:05 AM   I spent a total of  20 Minutes   in face to face in clinical consultation, greater than 50% of which was counseling/coordinating care for L4 KP

## 2015-01-23 NOTE — Sedation Documentation (Signed)
Patient is resting comfortably. 

## 2015-01-23 NOTE — Sedation Documentation (Signed)
Patient c/o pain.  

## 2015-01-23 NOTE — Sedation Documentation (Signed)
Patient is resting, states lying on the table is uncomfortable. Legs hurt.

## 2015-01-23 NOTE — Procedures (Signed)
S/P L4 Balloon KP 

## 2015-01-23 NOTE — Sedation Documentation (Addendum)
Patient c/o pain.  

## 2015-01-27 ENCOUNTER — Other Ambulatory Visit (HOSPITAL_COMMUNITY): Payer: Self-pay | Admitting: Interventional Radiology

## 2015-01-27 DIAGNOSIS — IMO0002 Reserved for concepts with insufficient information to code with codable children: Secondary | ICD-10-CM

## 2015-02-02 ENCOUNTER — Telehealth (HOSPITAL_COMMUNITY): Payer: Self-pay | Admitting: Interventional Radiology

## 2015-02-02 NOTE — Telephone Encounter (Signed)
Called pt's husband, left another VM for him to call me back to discuss her insurance denial. JM

## 2015-02-06 ENCOUNTER — Ambulatory Visit (HOSPITAL_COMMUNITY)
Admission: RE | Admit: 2015-02-06 | Discharge: 2015-02-06 | Disposition: A | Discharge status: Home / Self Care | Payer: Medicare Other | Source: Ambulatory Visit | Attending: Interventional Radiology | Admitting: Interventional Radiology

## 2015-02-06 DIAGNOSIS — IMO0002 Reserved for concepts with insufficient information to code with codable children: Secondary | ICD-10-CM

## 2015-03-24 ENCOUNTER — Other Ambulatory Visit: Payer: Self-pay

## 2015-03-24 DIAGNOSIS — Z1231 Encounter for screening mammogram for malignant neoplasm of breast: Secondary | ICD-10-CM

## 2015-04-03 ENCOUNTER — Other Ambulatory Visit (HOSPITAL_COMMUNITY): Payer: Self-pay | Admitting: Interventional Radiology

## 2015-04-03 ENCOUNTER — Ambulatory Visit (HOSPITAL_COMMUNITY)
Admission: RE | Admit: 2015-04-03 | Discharge: 2015-04-03 | Disposition: A | Discharge status: Home / Self Care | Payer: Medicare Other | Source: Ambulatory Visit | Attending: Interventional Radiology | Admitting: Interventional Radiology

## 2015-04-03 ENCOUNTER — Ambulatory Visit (HOSPITAL_BASED_OUTPATIENT_CLINIC_OR_DEPARTMENT_OTHER)
Admission: RE | Admit: 2015-04-03 | Discharge: 2015-04-03 | Disposition: A | Discharge status: Home / Self Care | Payer: Medicare Other | Source: Ambulatory Visit | Attending: Interventional Radiology | Admitting: Interventional Radiology

## 2015-04-03 DIAGNOSIS — Z853 Personal history of malignant neoplasm of breast: Secondary | ICD-10-CM | POA: Diagnosis not present

## 2015-04-03 DIAGNOSIS — M549 Dorsalgia, unspecified: Secondary | ICD-10-CM

## 2015-04-03 DIAGNOSIS — I739 Peripheral vascular disease, unspecified: Secondary | ICD-10-CM | POA: Insufficient documentation

## 2015-04-03 DIAGNOSIS — I10 Essential (primary) hypertension: Secondary | ICD-10-CM | POA: Insufficient documentation

## 2015-04-03 DIAGNOSIS — M7120 Synovial cyst of popliteal space [Baker], unspecified knee: Secondary | ICD-10-CM | POA: Diagnosis not present

## 2015-04-03 DIAGNOSIS — E780 Pure hypercholesterolemia: Secondary | ICD-10-CM | POA: Diagnosis not present

## 2015-04-03 DIAGNOSIS — I82403 Acute embolism and thrombosis of unspecified deep veins of lower extremity, bilateral: Secondary | ICD-10-CM

## 2015-04-03 DIAGNOSIS — M810 Age-related osteoporosis without current pathological fracture: Secondary | ICD-10-CM | POA: Insufficient documentation

## 2015-04-03 DIAGNOSIS — M1990 Unspecified osteoarthritis, unspecified site: Secondary | ICD-10-CM | POA: Insufficient documentation

## 2015-04-03 DIAGNOSIS — F329 Major depressive disorder, single episode, unspecified: Secondary | ICD-10-CM | POA: Insufficient documentation

## 2015-04-03 DIAGNOSIS — IMO0002 Reserved for concepts with insufficient information to code with codable children: Secondary | ICD-10-CM

## 2015-04-03 DIAGNOSIS — I824Z2 Acute embolism and thrombosis of unspecified deep veins of left distal lower extremity: Secondary | ICD-10-CM | POA: Diagnosis present

## 2015-04-03 NOTE — Progress Notes (Signed)
*  PRELIMINARY RESULTS* Vascular Ultrasound Lower extremity venous duplex has been completed.  Preliminary findings: DVT noted in the left gastroc veins. No DVT RLE. Baker's cyst noted on the left.   Called  results to Dr. Estanislado Pandy. MD advised patient to start wearing stockings, take aspirin, and contact PCP immediately about the DVT. Pt to see ortho MD next week to discuss baker's cyst.    Landry Mellow, RDMS, RVT  04/03/2015, 4:05 PM

## 2015-04-04 ENCOUNTER — Telehealth (HOSPITAL_COMMUNITY): Payer: Self-pay | Admitting: Interventional Radiology

## 2015-04-04 NOTE — Telephone Encounter (Signed)
Called pt to check her following her [place] bilateral LE yesterday. No answer but did leave patient a VM that she could call me if she needed to or I would call her back Monday to check on her again. JM

## 2015-05-04 ENCOUNTER — Ambulatory Visit: Payer: Medicare Other

## 2015-05-13 ENCOUNTER — Telehealth (HOSPITAL_COMMUNITY): Payer: Self-pay | Admitting: Interventional Radiology

## 2015-05-13 NOTE — Telephone Encounter (Signed)
Pt's husband called me this am to let me know that his wife's kyphoplasty had been denied payment from [facility]. He requested that I appeal this denial. I told him that I would get the documentation together and send in a request to [facility] for an appeal. I have done this and faxed the appeal request in to [facility]. JM

## 2015-05-19 ENCOUNTER — Ambulatory Visit
Admission: RE | Admit: 2015-05-19 | Discharge: 2015-05-19 | Disposition: A | Discharge status: Home / Self Care | Payer: Medicare Other | Source: Ambulatory Visit

## 2015-05-19 ENCOUNTER — Ambulatory Visit: Payer: Medicare Other

## 2015-05-19 DIAGNOSIS — Z1231 Encounter for screening mammogram for malignant neoplasm of breast: Secondary | ICD-10-CM

## 2015-05-21 ENCOUNTER — Other Ambulatory Visit: Payer: Self-pay | Admitting: Internal Medicine

## 2015-05-21 DIAGNOSIS — R928 Other abnormal and inconclusive findings on diagnostic imaging of breast: Secondary | ICD-10-CM

## 2015-05-27 ENCOUNTER — Ambulatory Visit
Admission: RE | Admit: 2015-05-27 | Discharge: 2015-05-27 | Disposition: A | Discharge status: Home / Self Care | Payer: Medicare Other | Source: Ambulatory Visit | Attending: Internal Medicine | Admitting: Internal Medicine

## 2015-05-27 DIAGNOSIS — R928 Other abnormal and inconclusive findings on diagnostic imaging of breast: Secondary | ICD-10-CM

## 2015-07-02 ENCOUNTER — Other Ambulatory Visit: Payer: Self-pay | Admitting: Cardiovascular Disease

## 2015-07-02 NOTE — Telephone Encounter (Signed)
REFILL 

## 2015-08-28 ENCOUNTER — Encounter: Payer: Self-pay | Admitting: Internal Medicine

## 2015-08-28 DIAGNOSIS — M5441 Lumbago with sciatica, right side: Secondary | ICD-10-CM | POA: Diagnosis not present

## 2015-08-28 DIAGNOSIS — G8929 Other chronic pain: Secondary | ICD-10-CM | POA: Diagnosis not present

## 2015-08-31 DIAGNOSIS — M5441 Lumbago with sciatica, right side: Secondary | ICD-10-CM | POA: Diagnosis not present

## 2015-08-31 DIAGNOSIS — G8929 Other chronic pain: Secondary | ICD-10-CM | POA: Diagnosis not present

## 2015-09-03 DIAGNOSIS — G8929 Other chronic pain: Secondary | ICD-10-CM | POA: Diagnosis not present

## 2015-09-03 DIAGNOSIS — M5441 Lumbago with sciatica, right side: Secondary | ICD-10-CM | POA: Diagnosis not present

## 2015-09-07 DIAGNOSIS — M5441 Lumbago with sciatica, right side: Secondary | ICD-10-CM | POA: Diagnosis not present

## 2015-09-07 DIAGNOSIS — G8929 Other chronic pain: Secondary | ICD-10-CM | POA: Diagnosis not present

## 2015-09-10 DIAGNOSIS — M5441 Lumbago with sciatica, right side: Secondary | ICD-10-CM | POA: Diagnosis not present

## 2015-09-10 DIAGNOSIS — G8929 Other chronic pain: Secondary | ICD-10-CM | POA: Diagnosis not present

## 2015-09-14 DIAGNOSIS — G8929 Other chronic pain: Secondary | ICD-10-CM | POA: Diagnosis not present

## 2015-09-14 DIAGNOSIS — M5441 Lumbago with sciatica, right side: Secondary | ICD-10-CM | POA: Diagnosis not present

## 2015-09-15 ENCOUNTER — Emergency Department (HOSPITAL_COMMUNITY)
Admission: EM | Admit: 2015-09-15 | Discharge: 2015-09-15 | Disposition: A | Discharge status: Home / Self Care | Payer: Medicare Other | Attending: Emergency Medicine | Admitting: Emergency Medicine

## 2015-09-15 ENCOUNTER — Emergency Department (HOSPITAL_COMMUNITY): Payer: Medicare Other

## 2015-09-15 ENCOUNTER — Encounter (HOSPITAL_COMMUNITY): Payer: Self-pay | Admitting: Emergency Medicine

## 2015-09-15 DIAGNOSIS — Z79899 Other long term (current) drug therapy: Secondary | ICD-10-CM | POA: Diagnosis not present

## 2015-09-15 DIAGNOSIS — S0003XA Contusion of scalp, initial encounter: Secondary | ICD-10-CM | POA: Diagnosis not present

## 2015-09-15 DIAGNOSIS — E7800 Pure hypercholesterolemia, unspecified: Secondary | ICD-10-CM | POA: Diagnosis not present

## 2015-09-15 DIAGNOSIS — Y9289 Other specified places as the place of occurrence of the external cause: Secondary | ICD-10-CM | POA: Insufficient documentation

## 2015-09-15 DIAGNOSIS — M810 Age-related osteoporosis without current pathological fracture: Secondary | ICD-10-CM | POA: Insufficient documentation

## 2015-09-15 DIAGNOSIS — S0990XA Unspecified injury of head, initial encounter: Secondary | ICD-10-CM | POA: Diagnosis present

## 2015-09-15 DIAGNOSIS — Z853 Personal history of malignant neoplasm of breast: Secondary | ICD-10-CM | POA: Insufficient documentation

## 2015-09-15 DIAGNOSIS — Z7901 Long term (current) use of anticoagulants: Secondary | ICD-10-CM | POA: Diagnosis not present

## 2015-09-15 DIAGNOSIS — S199XXA Unspecified injury of neck, initial encounter: Secondary | ICD-10-CM | POA: Diagnosis not present

## 2015-09-15 DIAGNOSIS — W01198A Fall on same level from slipping, tripping and stumbling with subsequent striking against other object, initial encounter: Secondary | ICD-10-CM | POA: Insufficient documentation

## 2015-09-15 DIAGNOSIS — S0101XA Laceration without foreign body of scalp, initial encounter: Secondary | ICD-10-CM | POA: Insufficient documentation

## 2015-09-15 DIAGNOSIS — Y998 Other external cause status: Secondary | ICD-10-CM | POA: Insufficient documentation

## 2015-09-15 DIAGNOSIS — S098XXA Other specified injuries of head, initial encounter: Secondary | ICD-10-CM | POA: Diagnosis not present

## 2015-09-15 DIAGNOSIS — Y9389 Activity, other specified: Secondary | ICD-10-CM | POA: Diagnosis not present

## 2015-09-15 DIAGNOSIS — F329 Major depressive disorder, single episode, unspecified: Secondary | ICD-10-CM | POA: Insufficient documentation

## 2015-09-15 DIAGNOSIS — I10 Essential (primary) hypertension: Secondary | ICD-10-CM | POA: Insufficient documentation

## 2015-09-15 DIAGNOSIS — W19XXXA Unspecified fall, initial encounter: Secondary | ICD-10-CM

## 2015-09-15 MED ORDER — BACITRACIN ZINC 500 UNIT/GM EX OINT
TOPICAL_OINTMENT | CUTANEOUS | Status: AC
  Administered 2015-09-15: 1
  Filled 2015-09-15: qty 3.6

## 2015-09-15 MED ORDER — BACITRACIN ZINC 500 UNIT/GM EX OINT: TOPICAL_OINTMENT | Freq: Two times a day (BID) | CUTANEOUS | Status: DC

## 2015-09-15 MED ORDER — TETANUS-DIPHTH-ACELL PERTUSSIS 5-2.5-18.5 LF-MCG/0.5 IM SUSP
0.5000 mL | Freq: Once | INTRAMUSCULAR | Status: AC
  Administered 2015-09-15: 0.5 mL via INTRAMUSCULAR
  Filled 2015-09-15: qty 0.5

## 2015-09-15 MED ORDER — LIDOCAINE-EPINEPHRINE (PF) 2 %-1:200000 IJ SOLN
10.0000 mL | Freq: Once | INTRAMUSCULAR | Status: AC
  Administered 2015-09-15: 10 mL via INTRADERMAL
  Filled 2015-09-15: qty 20

## 2015-09-15 NOTE — ED Notes (Signed)
Flagged MD for prompt assessment.  No change in mental status.

## 2015-09-15 NOTE — Discharge Instructions (Signed)
Usual cane at all times to prevent falls. It is especially important to prevent falls since you are on blood thinners. Wash your hair daily as normal, then place a thin layer of bacitracin ointment over the wound on your scalp. Signs of infection include redness, more pain, drainage from the wounds, fever. Return if you feel you may developing an infection or see your doctor

## 2015-09-15 NOTE — ED Provider Notes (Signed)
CSN: [id number]     Arrival date & time 09/15/15  1740 History   First MD Initiated Contact with Patient 09/15/15 1820     Chief Complaint  Patient presents with  . Fall     (Consider location/radiation/quality/duration/timing/severity/associated sxs/prior Treatment) HPI Patient slipped and fell in garage of her home 3:30 PM today, striking her head. She denies any loss of consciousness. She felt well prior to the event. She was alert and ambulatory immediately after the event. No treatment prior to coming here. She suffered a scalp laceration as result of the event. Denies any lightheadedness. No other associated symptoms. Past Medical History  Diagnosis Date  . Hypertension   . High cholesterol   . Complication of anesthesia 04/18/2012    "didn't tolerate it today very well; had the shakes and very hard time w/it"  . Arthritis     "in my back"  . Osteoporosis   . Peripheral vascular disease (HCC)     hx of ligation   . Breast cancer ([place]) 1996    left breast cancer   . Depression 11/20/2012  . History of alcoholism ([place])     7 1/2 years clean   Past Surgical History  Procedure Laterality Date  . Knee arthroscopy  04/17/2012    Procedure: ARTHROSCOPY KNEE;  Surgeon: Rudean Haskell, MD;  Location: [place];  Service: Orthopedics;  Laterality: Right;  . I&d extremity  04/17/2012    Procedure: IRRIGATION AND DEBRIDEMENT EXTREMITY;  Surgeon: Rudean Haskell, MD;  Location: [place];  Service: Orthopedics;  Laterality: Right;  . Tonsillectomy and adenoidectomy      "I was a child"  . Posterior laminectomy / decompression lumbar spine  1980's  . Breast lumpectomy Left 1996  . Menisectomy Right 04/11/2012  . Rotator cuff surgery  Right   . Vein ligation    . Total knee arthroplasty Right 10/29/2012    Procedure: RIGHT TOTAL KNEE ARTHROPLASTY;  Surgeon: Gearlean Alf, MD;  Location: [facility];  Service: Orthopedics;  Laterality: Right;  . Kyphoplasty  2009, 2013    thorasic, lumbar    Family History  Problem Relation Age of Onset  . Kidney failure Mother   . Anuerysm Father     AAA   Social History  Substance Use Topics  . Smoking status: Never Smoker   . Smokeless tobacco: Never Used  . Alcohol Use: No     Comment: 04/18/2012 "couple drinks of vodka/day til ~ 6 yr ago; nothing in the last 6 yrs"   OB History    No data available     Review of Systems  Constitutional: Negative.   HENT:       Hematoma at vertex of scalp with laceration  Respiratory: Negative.   Cardiovascular: Negative.   Gastrointestinal: Negative.   Musculoskeletal: Positive for gait problem.       Walks with cane  Skin: Positive for wound.       Scalp laceration  Neurological: Negative.   Psychiatric/Behavioral: Negative.   All other systems reviewed and are negative.     Allergies  Oysters and Codeine  Home Medications   Prior to Admission medications   Medication Sig Start Date End Date Taking? Authorizing Provider  Artificial Tear Ointment (DRY EYES OP) Apply 1 drop to eye daily as needed (dry eyes).   Yes Historical Provider, MD  carvedilol (COREG) 3.125 MG tablet TAKE 1 TABLET (3.125 MG TOTAL) BY MOUTH 2 (TWO) TIMES DAILY WITH A MEAL. 07/02/15  Yes Mihai Croitoru, MD  Cholecalciferol 1000 UNITS capsule Take 2,000 Units by mouth daily. [facility] VITAMIN D3 1000 UNIT CAPS 10/19/11  Yes Historical Provider, MD  ELIQUIS 5 MG TABS tablet Take 5 mg by mouth 2 (two) times daily. 09/02/15  Yes Historical Provider, MD  escitalopram (LEXAPRO) 20 MG tablet TAKE 1 TABLET BY MOUTH ONCE DAILY 08/04/15  Yes Historical Provider, MD  Multiple Vitamins-Minerals (MULTIVITAMIN ADULT) TABS MULTIVITAMINS TABS 08/17/09  Yes Historical Provider, MD  simvastatin (ZOCOR) 20 MG tablet Take 20 mg by mouth daily. 03/30/10  Yes Historical Provider, MD  triamterene-hydrochlorothiazide (MAXZIDE-25) 37.5-25 MG per tablet Take 1 tablet by mouth daily. 01/14/15  Yes Mihai Croitoru, MD  alendronate (FOSAMAX) 70 MG tablet  Take 70 mg by mouth once a week. Every Sunday. 08/21/13   Historical Provider, MD  amoxicillin (AMOXIL) 500 MG capsule FOR DENTAL USE ONLY 11/16/14   Historical Provider, MD   BP 134/80 mmHg  Pulse 72  Temp(Src) 98 F (36.7 C) (Oral)  Resp 18  SpO2 91% Physical Exam  Constitutional: She is oriented to person, place, and time. She appears well-developed and well-nourished. No distress.  HENT:  Hematoma at the vertex of scalp with  Two 1cm llaceration otherwise normal cephalic atraumatic  Eyes: Conjunctivae are normal. Pupils are equal, round, and reactive to light.  Neck: Neck supple. No tracheal deviation present. No thyromegaly present.  No point tenderness  Cardiovascular: Normal rate and regular rhythm.   No murmur heard. Pulmonary/Chest: Effort normal and breath sounds normal.  Abdominal: Soft. Bowel sounds are normal. She exhibits no distension. There is no tenderness.  Musculoskeletal: Normal range of motion. She exhibits no edema or tenderness.  Neurological: She is alert and oriented to person, place, and time. No cranial nerve deficit. Coordination normal.  Skin: Skin is warm and dry. No rash noted.  Psychiatric: She has a normal mood and affect.  Nursing note and vitals reviewed.   ED Course  Procedures (including critical care time) Labs Review Labs Reviewed - No data to display  Imaging Review No results found. I have personally reviewed and evaluated these images and lab results as part of my medical decision-making.   EKG Interpretation None     Scalp lacerations were numbed locally with 2% lidocaine with epi and explored. No active bleeding.  9:10 PM patient is alert ambulatory with minimal assistance. Not lightheaded on standing T dap administered. Results for orders placed or performed during the hospital encounter of [id number]  Basic metabolic panel  Result Value Ref Range   Sodium 139 135 - 145 mmol/L   Potassium 3.6 3.5 - 5.1 mmol/L   Chloride 101 101  - 111 mmol/L   CO2 28 22 - 32 mmol/L   Glucose, Bld 102 (H) 65 - 99 mg/dL   BUN 37 (H) 6 - 20 mg/dL   Creatinine, Ser 0.84 0.44 - 1.00 mg/dL   Calcium 9.6 8.9 - 10.3 mg/dL   GFR calc non Af Amer >60 >60 mL/min   GFR calc Af Amer >60 >60 mL/min   Anion gap 10 5 - 15  CBC  Result Value Ref Range   WBC 7.5 4.0 - 10.5 K/uL   RBC 4.20 3.87 - 5.11 MIL/uL   Hemoglobin 13.2 12.0 - 15.0 g/dL   HCT 39.5 36.0 - 46.0 %   MCV 94.0 78.0 - 100.0 fL   MCH 31.4 26.0 - 34.0 pg   MCHC 33.4 30.0 - 36.0 g/dL   RDW 13.7 11.5 -  15.5 %   Platelets 212 150 - 400 K/uL  Protime-INR  Result Value Ref Range   Prothrombin Time 13.1 11.6 - 15.2 seconds   INR 0.97 0.00 - 1.49   Ct Head Wo Contrast  09/15/2015  CLINICAL DATA:  [place], scalp hematoma, on blood thinners EXAM: CT HEAD WITHOUT CONTRAST CT CERVICAL SPINE WITHOUT CONTRAST TECHNIQUE: Multidetector CT imaging of the head and cervical spine was performed following the standard protocol without intravenous contrast. Multiplanar CT image reconstructions of the cervical spine were also generated. COMPARISON:  To 05/2015 FINDINGS: CT HEAD FINDINGS Moderate diffuse cortical atrophy stable. Low attenuation in the deep white matter. No hydrocephalus. No evidence of mass or infarct. No parenchymal hemorrhage or extra-axial fluid. Right scalp hematoma is noted posteriorly. There are no skull fractures. The calvarium is intact. Visualized portions of the paranasal sinuses are clear. CT CERVICAL SPINE FINDINGS No acute soft tissue abnormalities. Stable small left thyroid nodule. Carotid artery calcification noted. Lung apices clear. There is no prevertebral soft tissue swelling. There is a fracture of the odontoid process. The margins of this fracture bar entirely corticated. There is degenerative change at the atlantodental interval. Stable mild C7 compression deformity. Stable multilevel degenerative disc disease. IMPRESSION: Right scalp hematoma without acute intracranial  abnormality. Stable chronic fracture of the odontoid process. Other nonacute findings described above. Electronically Signed   By: Skipper Cliche M.D.   On: 09/15/2015 19:14   Ct Cervical Spine Wo Contrast  09/15/2015  CLINICAL DATA:  [place], scalp hematoma, on blood thinners EXAM: CT HEAD WITHOUT CONTRAST CT CERVICAL SPINE WITHOUT CONTRAST TECHNIQUE: Multidetector CT imaging of the head and cervical spine was performed following the standard protocol without intravenous contrast. Multiplanar CT image reconstructions of the cervical spine were also generated. COMPARISON:  To 05/2015 FINDINGS: CT HEAD FINDINGS Moderate diffuse cortical atrophy stable. Low attenuation in the deep white matter. No hydrocephalus. No evidence of mass or infarct. No parenchymal hemorrhage or extra-axial fluid. Right scalp hematoma is noted posteriorly. There are no skull fractures. The calvarium is intact. Visualized portions of the paranasal sinuses are clear. CT CERVICAL SPINE FINDINGS No acute soft tissue abnormalities. Stable small left thyroid nodule. Carotid artery calcification noted. Lung apices clear. There is no prevertebral soft tissue swelling. There is a fracture of the odontoid process. The margins of this fracture bar entirely corticated. There is degenerative change at the atlantodental interval. Stable mild C7 compression deformity. Stable multilevel degenerative disc disease. IMPRESSION: Right scalp hematoma without acute intracranial abnormality. Stable chronic fracture of the odontoid process. Other nonacute findings described above. Electronically Signed   By: Skipper Cliche M.D.   On: 09/15/2015 19:14    MDM   ItWas decided scalp lacerations did not require repair. Bacitracin ointment applied. Final diagnoses:  None   Plan discharge home. Diagnoses #1 fall #2 minor closed head trauma #3 scalp lacerations    Orlie Dakin, MD 09/15/15 2117

## 2015-09-15 NOTE — ED Notes (Signed)
Per pt, states she lost balance and fell-hit back of head-on blood thinners-no LOC

## 2015-09-18 DIAGNOSIS — M5441 Lumbago with sciatica, right side: Secondary | ICD-10-CM | POA: Diagnosis not present

## 2015-09-18 DIAGNOSIS — G8929 Other chronic pain: Secondary | ICD-10-CM | POA: Diagnosis not present

## 2015-09-22 DIAGNOSIS — M5441 Lumbago with sciatica, right side: Secondary | ICD-10-CM | POA: Diagnosis not present

## 2015-09-22 DIAGNOSIS — I10 Essential (primary) hypertension: Secondary | ICD-10-CM | POA: Diagnosis not present

## 2015-09-22 DIAGNOSIS — G8929 Other chronic pain: Secondary | ICD-10-CM | POA: Diagnosis not present

## 2015-09-22 DIAGNOSIS — E119 Type 2 diabetes mellitus without complications: Secondary | ICD-10-CM | POA: Diagnosis not present

## 2015-09-22 DIAGNOSIS — M859 Disorder of bone density and structure, unspecified: Secondary | ICD-10-CM | POA: Diagnosis not present

## 2015-09-22 DIAGNOSIS — N390 Urinary tract infection, site not specified: Secondary | ICD-10-CM | POA: Diagnosis not present

## 2015-09-22 DIAGNOSIS — R8290 Unspecified abnormal findings in urine: Secondary | ICD-10-CM | POA: Diagnosis not present

## 2015-09-23 DIAGNOSIS — M25551 Pain in right hip: Secondary | ICD-10-CM | POA: Diagnosis not present

## 2015-09-23 DIAGNOSIS — M5441 Lumbago with sciatica, right side: Secondary | ICD-10-CM | POA: Diagnosis not present

## 2015-09-23 DIAGNOSIS — M25552 Pain in left hip: Secondary | ICD-10-CM | POA: Diagnosis not present

## 2015-09-28 DIAGNOSIS — E119 Type 2 diabetes mellitus without complications: Secondary | ICD-10-CM | POA: Diagnosis not present

## 2015-09-28 DIAGNOSIS — Z6825 Body mass index (BMI) 25.0-25.9, adult: Secondary | ICD-10-CM | POA: Diagnosis not present

## 2015-09-28 DIAGNOSIS — M4806 Spinal stenosis, lumbar region: Secondary | ICD-10-CM | POA: Diagnosis not present

## 2015-09-28 DIAGNOSIS — M7122 Synovial cyst of popliteal space [Baker], left knee: Secondary | ICD-10-CM | POA: Diagnosis not present

## 2015-09-28 DIAGNOSIS — G8929 Other chronic pain: Secondary | ICD-10-CM | POA: Diagnosis not present

## 2015-09-28 DIAGNOSIS — I872 Venous insufficiency (chronic) (peripheral): Secondary | ICD-10-CM | POA: Diagnosis not present

## 2015-09-28 DIAGNOSIS — M5441 Lumbago with sciatica, right side: Secondary | ICD-10-CM | POA: Diagnosis not present

## 2015-09-28 DIAGNOSIS — W19XXXA Unspecified fall, initial encounter: Secondary | ICD-10-CM | POA: Diagnosis not present

## 2015-09-28 DIAGNOSIS — E784 Other hyperlipidemia: Secondary | ICD-10-CM | POA: Diagnosis not present

## 2015-09-28 DIAGNOSIS — R002 Palpitations: Secondary | ICD-10-CM | POA: Diagnosis not present

## 2015-09-28 DIAGNOSIS — I82402 Acute embolism and thrombosis of unspecified deep veins of left lower extremity: Secondary | ICD-10-CM | POA: Diagnosis not present

## 2015-09-28 DIAGNOSIS — Z0000 Encounter for general adult medical examination without abnormal findings: Secondary | ICD-10-CM | POA: Diagnosis not present

## 2015-09-30 DIAGNOSIS — M5441 Lumbago with sciatica, right side: Secondary | ICD-10-CM | POA: Diagnosis not present

## 2015-09-30 DIAGNOSIS — G8929 Other chronic pain: Secondary | ICD-10-CM | POA: Diagnosis not present

## 2015-10-01 DIAGNOSIS — Z1212 Encounter for screening for malignant neoplasm of rectum: Secondary | ICD-10-CM | POA: Diagnosis not present

## 2015-10-18 ENCOUNTER — Emergency Department (HOSPITAL_BASED_OUTPATIENT_CLINIC_OR_DEPARTMENT_OTHER): Payer: Medicare Other

## 2015-10-18 ENCOUNTER — Emergency Department (HOSPITAL_BASED_OUTPATIENT_CLINIC_OR_DEPARTMENT_OTHER)
Admission: EM | Admit: 2015-10-18 | Discharge: 2015-10-18 | Disposition: A | Discharge status: Home / Self Care | Payer: Medicare Other | Attending: Emergency Medicine | Admitting: Emergency Medicine

## 2015-10-18 ENCOUNTER — Encounter (HOSPITAL_BASED_OUTPATIENT_CLINIC_OR_DEPARTMENT_OTHER): Payer: Self-pay | Admitting: *Deleted

## 2015-10-18 DIAGNOSIS — R269 Unspecified abnormalities of gait and mobility: Secondary | ICD-10-CM | POA: Insufficient documentation

## 2015-10-18 DIAGNOSIS — Y998 Other external cause status: Secondary | ICD-10-CM | POA: Diagnosis not present

## 2015-10-18 DIAGNOSIS — Z853 Personal history of malignant neoplasm of breast: Secondary | ICD-10-CM | POA: Insufficient documentation

## 2015-10-18 DIAGNOSIS — I10 Essential (primary) hypertension: Secondary | ICD-10-CM | POA: Insufficient documentation

## 2015-10-18 DIAGNOSIS — M546 Pain in thoracic spine: Secondary | ICD-10-CM

## 2015-10-18 DIAGNOSIS — E7800 Pure hypercholesterolemia, unspecified: Secondary | ICD-10-CM | POA: Insufficient documentation

## 2015-10-18 DIAGNOSIS — M810 Age-related osteoporosis without current pathological fracture: Secondary | ICD-10-CM | POA: Insufficient documentation

## 2015-10-18 DIAGNOSIS — Y9389 Activity, other specified: Secondary | ICD-10-CM | POA: Diagnosis not present

## 2015-10-18 DIAGNOSIS — Z792 Long term (current) use of antibiotics: Secondary | ICD-10-CM | POA: Insufficient documentation

## 2015-10-18 DIAGNOSIS — S299XXA Unspecified injury of thorax, initial encounter: Secondary | ICD-10-CM | POA: Diagnosis not present

## 2015-10-18 DIAGNOSIS — Z79899 Other long term (current) drug therapy: Secondary | ICD-10-CM | POA: Insufficient documentation

## 2015-10-18 DIAGNOSIS — F329 Major depressive disorder, single episode, unspecified: Secondary | ICD-10-CM | POA: Insufficient documentation

## 2015-10-18 DIAGNOSIS — W1839XA Other fall on same level, initial encounter: Secondary | ICD-10-CM | POA: Insufficient documentation

## 2015-10-18 DIAGNOSIS — S29002A Unspecified injury of muscle and tendon of back wall of thorax, initial encounter: Secondary | ICD-10-CM | POA: Insufficient documentation

## 2015-10-18 DIAGNOSIS — Y9289 Other specified places as the place of occurrence of the external cause: Secondary | ICD-10-CM | POA: Diagnosis not present

## 2015-10-18 DIAGNOSIS — R0781 Pleurodynia: Secondary | ICD-10-CM | POA: Diagnosis not present

## 2015-10-18 NOTE — ED Notes (Addendum)
PT HIGH FALL RISK - implemented high fall risk preventions

## 2015-10-18 NOTE — ED Provider Notes (Signed)
CSN: [id number]     Arrival date & time 10/18/15  [id number] History   First MD Initiated Contact with Patient 10/18/15 1003     Chief Complaint  Patient presents with  . Fall     (Consider location/radiation/quality/duration/timing/severity/associated sxs/prior Treatment) Patient is a 82 y.o. female presenting with fall. The history is provided by the patient.  Fall This is a chronic problem. The current episode started yesterday. The problem occurs constantly. The problem has not changed since onset.Pertinent negatives include no chest pain, no abdominal pain, no headaches and no shortness of breath. Nothing aggravates the symptoms. Nothing relieves the symptoms. She has tried nothing for the symptoms. The treatment provided no relief.   82 yo F With a chief complaints of right-sided rib pain. Patient was walking when she lost her balance and bumped up against the wall. This happened last night the pain is gotten progressively worse. Denies shortness of breath. Has a history of multiple procedures done on her back. Has a chronic problem of difficulty with walking is currently going to physical therapy. She denies loss of consciousness head injury chest pain shortness breath headache or neck pain prior to the event.  Past Medical History  Diagnosis Date  . Hypertension   . High cholesterol   . Complication of anesthesia 04/18/2012    "didn't tolerate it today very well; had the shakes and very hard time w/it"  . Arthritis     "in my back"  . Osteoporosis   . Peripheral vascular disease (HCC)     hx of ligation   . Breast cancer ([place]) 1996    left breast cancer   . Depression 11/20/2012  . History of alcoholism ([place])     7 1/2 years clean   Past Surgical History  Procedure Laterality Date  . Knee arthroscopy  04/17/2012    Procedure: ARTHROSCOPY KNEE;  Surgeon: Rudean Haskell, MD;  Location: [place];  Service: Orthopedics;  Laterality: Right;  . I&d extremity  04/17/2012    Procedure:  IRRIGATION AND DEBRIDEMENT EXTREMITY;  Surgeon: Rudean Haskell, MD;  Location: [place];  Service: Orthopedics;  Laterality: Right;  . Tonsillectomy and adenoidectomy      "I was a child"  . Posterior laminectomy / decompression lumbar spine  1980's  . Breast lumpectomy Left 1996  . Menisectomy Right 04/11/2012  . Rotator cuff surgery  Right   . Vein ligation    . Total knee arthroplasty Right 10/29/2012    Procedure: RIGHT TOTAL KNEE ARTHROPLASTY;  Surgeon: Gearlean Alf, MD;  Location: [facility];  Service: Orthopedics;  Laterality: Right;  . Kyphoplasty  2009, 2013    thorasic, lumbar   Family History  Problem Relation Age of Onset  . Kidney failure Mother   . Anuerysm Father     AAA   Social History  Substance Use Topics  . Smoking status: Never Smoker   . Smokeless tobacco: Never Used  . Alcohol Use: No     Comment: 04/18/2012 "couple drinks of vodka/day til ~ 6 yr ago; nothing in the last 6 yrs"   OB History    No data available     Review of Systems  Constitutional: Negative for fever and chills.  HENT: Negative for congestion and rhinorrhea.   Eyes: Negative for redness and visual disturbance.  Respiratory: Negative for shortness of breath and wheezing.   Cardiovascular: Negative for chest pain and palpitations.  Gastrointestinal: Negative for nausea, vomiting and abdominal pain.  Genitourinary: Negative for dysuria and urgency.  Musculoskeletal: Positive for myalgias, back pain and gait problem. Negative for arthralgias.  Skin: Negative for pallor and wound.  Neurological: Negative for dizziness and headaches.      Allergies  Oysters and Codeine  Home Medications   Prior to Admission medications   Medication Sig Start Date End Date Taking? Authorizing Provider  alendronate (FOSAMAX) 70 MG tablet Take 70 mg by mouth once a week. Every Sunday. 08/21/13   Historical Provider, MD  amoxicillin (AMOXIL) 500 MG capsule FOR DENTAL USE ONLY 11/16/14   Historical Provider,  MD  Artificial Tear Ointment (DRY EYES OP) Apply 1 drop to eye daily as needed (dry eyes).    Historical Provider, MD  carvedilol (COREG) 3.125 MG tablet TAKE 1 TABLET (3.125 MG TOTAL) BY MOUTH 2 (TWO) TIMES DAILY WITH A MEAL. 07/02/15   Mihai Croitoru, MD  Cholecalciferol 1000 UNITS capsule Take 2,000 Units by mouth daily. [facility] VITAMIN D3 1000 UNIT CAPS 10/19/11   Historical Provider, MD  ELIQUIS 5 MG TABS tablet Take 5 mg by mouth 2 (two) times daily. 09/02/15   Historical Provider, MD  escitalopram (LEXAPRO) 20 MG tablet TAKE 1 TABLET BY MOUTH ONCE DAILY 08/04/15   Historical Provider, MD  Multiple Vitamins-Minerals (MULTIVITAMIN ADULT) TABS MULTIVITAMINS TABS 08/17/09   Historical Provider, MD  simvastatin (ZOCOR) 20 MG tablet Take 20 mg by mouth daily. 03/30/10   Historical Provider, MD  triamterene-hydrochlorothiazide (MAXZIDE-25) 37.5-25 MG per tablet Take 1 tablet by mouth daily. 01/14/15   Mihai Croitoru, MD   BP 159/78 mmHg  Pulse 68  Temp(Src) 98.1 F (36.7 C) (Oral)  Resp 18  Ht 5' 6.5" (1.689 m)  Wt 155 lb (70.308 kg)  BMI 24.65 kg/m2  SpO2 96% Physical Exam  Constitutional: She is oriented to person, place, and time. She appears well-developed and well-nourished. No distress.  HENT:  Head: Normocephalic and atraumatic.  Eyes: EOM are normal. Pupils are equal, round, and reactive to light.  Neck: Normal range of motion. Neck supple.  Cardiovascular: Normal rate and regular rhythm.  Exam reveals no gallop and no friction rub.   No murmur heard. Pulmonary/Chest: Effort normal. She has no wheezes. She has no rales.  Abdominal: Soft. She exhibits no distension. There is no tenderness. There is no rebound and no guarding.  Musculoskeletal: She exhibits tenderness (tender about the right rib angle. About ribs 8 through 12.). She exhibits no edema.  Neurological: She is alert and oriented to person, place, and time.  Skin: Skin is warm and dry. She is not diaphoretic.  Psychiatric: She  has a normal mood and affect. Her behavior is normal.  Nursing note and vitals reviewed.   ED Course  Procedures (including critical care time) Labs Review Labs Reviewed - No data to display  Imaging Review No results found. I have personally reviewed and evaluated these images and lab results as part of my medical decision-making.   EKG Interpretation None      MDM   Final diagnoses:  Right-sided thoracic back pain    82 yo F with a chief complaint of right-sided rib pain. Will obtain plain films to further evaluate.  Xray negative for fx.  D/c home.   :  I have discussed the diagnosis/risks/treatment options with the patient and believe the pt to be eligible for discharge home to follow-up with PCP. We also discussed returning to the ED immediately if new or worsening sx occur. We discussed the sx which  are most concerning (e.g., sudden worsening pain, fever, weakness, cauda equina, inability to tolerate by mouth) that necessitate immediate return. Medications administered to the patient during their visit and any new prescriptions provided to the patient are listed below.  Medications given during this visit Medications - No data to display  Discharge Medication List as of 10/18/2015 11:30 AM      The patient appears reasonably screen and/or stabilized for discharge and I doubt any other medical condition or other [facility] requiring further screening, evaluation, or treatment in the ED at this time prior to discharge.    Deno Etienne, DO 10/18/15 [phone]

## 2015-10-18 NOTE — ED Notes (Signed)
Back skin assessment performed, no bruising, redness or deformities noted.

## 2015-10-18 NOTE — ED Notes (Signed)
DC instructions and results of x-rays discussed with pt, discussed non-pharmacological pain control, also when the need may arise to return to the ED. Opportunity for questions provided. Wheelchair provided to aid in assisting pt to DC area. Husband with pt.

## 2015-10-18 NOTE — Discharge Instructions (Signed)

## 2015-10-18 NOTE — ED Notes (Signed)
Pt states she fell last pm approx 2000hrs, hit back against wall, denies hitting head or having LOC, pt denies having CP or dizziness when incident occurred, "I just lost my balance"

## 2015-11-04 DIAGNOSIS — M25551 Pain in right hip: Secondary | ICD-10-CM | POA: Diagnosis not present

## 2015-11-04 DIAGNOSIS — M5441 Lumbago with sciatica, right side: Secondary | ICD-10-CM | POA: Diagnosis not present

## 2015-11-04 DIAGNOSIS — G5791 Unspecified mononeuropathy of right lower limb: Secondary | ICD-10-CM | POA: Diagnosis not present

## 2015-11-24 DIAGNOSIS — G8929 Other chronic pain: Secondary | ICD-10-CM | POA: Diagnosis not present

## 2015-11-24 DIAGNOSIS — G5781 Other specified mononeuropathies of right lower limb: Secondary | ICD-10-CM | POA: Diagnosis not present

## 2015-11-24 DIAGNOSIS — M5441 Lumbago with sciatica, right side: Secondary | ICD-10-CM | POA: Diagnosis not present

## 2015-12-15 DIAGNOSIS — M5441 Lumbago with sciatica, right side: Secondary | ICD-10-CM | POA: Diagnosis not present

## 2015-12-28 ENCOUNTER — Other Ambulatory Visit: Payer: Self-pay | Admitting: Cardiovascular Disease

## 2015-12-29 NOTE — Telephone Encounter (Signed)
Rx(s) sent to pharmacy electronically.  

## 2015-12-30 ENCOUNTER — Emergency Department (HOSPITAL_BASED_OUTPATIENT_CLINIC_OR_DEPARTMENT_OTHER): Payer: Medicare Other

## 2015-12-30 ENCOUNTER — Emergency Department (HOSPITAL_BASED_OUTPATIENT_CLINIC_OR_DEPARTMENT_OTHER)
Admission: EM | Admit: 2015-12-30 | Discharge: 2015-12-30 | Disposition: A | Discharge status: Home / Self Care | Payer: Medicare Other | Attending: Emergency Medicine | Admitting: Emergency Medicine

## 2015-12-30 ENCOUNTER — Encounter (HOSPITAL_BASED_OUTPATIENT_CLINIC_OR_DEPARTMENT_OTHER): Payer: Self-pay

## 2015-12-30 DIAGNOSIS — F329 Major depressive disorder, single episode, unspecified: Secondary | ICD-10-CM | POA: Insufficient documentation

## 2015-12-30 DIAGNOSIS — Y929 Unspecified place or not applicable: Secondary | ICD-10-CM | POA: Diagnosis not present

## 2015-12-30 DIAGNOSIS — Z79899 Other long term (current) drug therapy: Secondary | ICD-10-CM | POA: Insufficient documentation

## 2015-12-30 DIAGNOSIS — I739 Peripheral vascular disease, unspecified: Secondary | ICD-10-CM | POA: Insufficient documentation

## 2015-12-30 DIAGNOSIS — Z853 Personal history of malignant neoplasm of breast: Secondary | ICD-10-CM | POA: Diagnosis not present

## 2015-12-30 DIAGNOSIS — Y939 Activity, unspecified: Secondary | ICD-10-CM | POA: Insufficient documentation

## 2015-12-30 DIAGNOSIS — S0990XA Unspecified injury of head, initial encounter: Secondary | ICD-10-CM

## 2015-12-30 DIAGNOSIS — I10 Essential (primary) hypertension: Secondary | ICD-10-CM | POA: Diagnosis not present

## 2015-12-30 DIAGNOSIS — Y999 Unspecified external cause status: Secondary | ICD-10-CM | POA: Insufficient documentation

## 2015-12-30 DIAGNOSIS — W01198A Fall on same level from slipping, tripping and stumbling with subsequent striking against other object, initial encounter: Secondary | ICD-10-CM | POA: Insufficient documentation

## 2015-12-30 DIAGNOSIS — S0181XA Laceration without foreign body of other part of head, initial encounter: Secondary | ICD-10-CM

## 2015-12-30 HISTORY — DX: Acute embolism and thrombosis of unspecified deep veins of unspecified lower extremity: I82.409

## 2015-12-30 MED ORDER — LIDOCAINE-EPINEPHRINE 1 %-1:100000 IJ SOLN
INTRAMUSCULAR | Status: DC
Start: 2015-12-30 — End: 2015-12-30
  Filled 2015-12-30: qty 1

## 2015-12-30 MED ORDER — LIDOCAINE-EPINEPHRINE 2 %-1:100000 IJ SOLN
INTRAMUSCULAR | Status: AC
  Administered 2015-12-30: 1 mL
  Filled 2015-12-30: qty 1

## 2015-12-30 MED ORDER — LIDOCAINE-EPINEPHRINE (PF) 2 %-1:200000 IJ SOLN: 20.0000 mL | Freq: Once | INTRAMUSCULAR | Status: DC

## 2015-12-30 NOTE — ED Notes (Addendum)
Pt hit head on wooden truss in attic-lac noted to left eyebrow area-gauze taped in triage-no LOC-presents to triage in w/c-states she uses cane prn-NAD-pt states she was on a blood thinner up until approx 4 weeks ago for a DVT

## 2015-12-30 NOTE — ED Provider Notes (Signed)
CSN: [id number]     Arrival date & time 12/30/15  1318 History   First MD Initiated Contact with Patient 12/30/15 1334     Chief Complaint  Patient presents with  . Head Laceration   (Consider location/radiation/quality/duration/timing/severity/associated sxs/prior Treatment) HPI Comments: Patient presents with complaint of acute onset of left forehead laceration sustained at around noon today. Patient was in her attic and she tripped over a beam and struck her head. No loss of consciousness. Patient applied pressure and an ice pack prior to arrival. No current blood thinners. No neck pain or back pain. No headache, vision change or vomiting. No chest pain or abdominal pain.  Patient is a 82 y.o. female presenting with scalp laceration. The history is provided by the patient.  Head Laceration Pertinent negatives include no chest pain, fatigue, headaches, nausea, neck pain, numbness, vomiting or weakness.    Past Medical History  Diagnosis Date  . Hypertension   . High cholesterol   . Complication of anesthesia 04/18/2012    "didn't tolerate it today very well; had the shakes and very hard time w/it"  . Arthritis     "in my back"  . Osteoporosis   . Peripheral vascular disease (HCC)     hx of ligation   . Breast cancer ([place]) 1996    left breast cancer   . Depression 11/20/2012  . History of alcoholism ([place])     7 1/2 years clean  . DVT (deep venous thrombosis) [facility])    Past Surgical History  Procedure Laterality Date  . Knee arthroscopy  04/17/2012    Procedure: ARTHROSCOPY KNEE;  Surgeon: Rudean Haskell, MD;  Location: [place];  Service: Orthopedics;  Laterality: Right;  . I&d extremity  04/17/2012    Procedure: IRRIGATION AND DEBRIDEMENT EXTREMITY;  Surgeon: Rudean Haskell, MD;  Location: [place];  Service: Orthopedics;  Laterality: Right;  . Tonsillectomy and adenoidectomy      "I was a child"  . Posterior laminectomy / decompression lumbar spine  1980's  . Breast lumpectomy Left  1996  . Menisectomy Right 04/11/2012  . Rotator cuff surgery  Right   . Vein ligation    . Total knee arthroplasty Right 10/29/2012    Procedure: RIGHT TOTAL KNEE ARTHROPLASTY;  Surgeon: Gearlean Alf, MD;  [facility];  Service: Orthopedics;  Laterality: Right;  . Kyphoplasty  2009, 2013    thorasic, lumbar   Family History  Problem Relation Age of Onset  . Kidney failure Mother   . Anuerysm Father     AAA   Social History  Substance Use Topics  . Smoking status: Never Smoker   . Smokeless tobacco: Never Used  . Alcohol Use: No   OB History    No data available     Review of Systems  Constitutional: Negative for fatigue.  HENT: Negative for tinnitus.   Eyes: Negative for photophobia, pain and visual disturbance.  Respiratory: Negative for shortness of breath.   Cardiovascular: Negative for chest pain.  Gastrointestinal: Negative for nausea and vomiting.  Musculoskeletal: Negative for back pain, gait problem and neck pain.  Skin: Positive for wound.  Neurological: Negative for dizziness, weakness, light-headedness, numbness and headaches.  Psychiatric/Behavioral: Negative for confusion and decreased concentration.    Allergies  Oysters and Codeine  Home Medications   Prior to Admission medications   Medication Sig Start Date End Date Taking? Authorizing Provider  alendronate (FOSAMAX) 70 MG tablet Take 70 mg by mouth once a  week. Every Sunday. 08/21/13   Historical Provider, MD  amoxicillin (AMOXIL) 500 MG capsule FOR DENTAL USE ONLY 11/16/14   Historical Provider, MD  Artificial Tear Ointment (DRY EYES OP) Apply 1 drop to eye daily as needed (dry eyes).    Historical Provider, MD  carvedilol (COREG) 3.125 MG tablet TAKE 1 TABLET (3.125 MG TOTAL) BY MOUTH 2 (TWO) TIMES DAILY WITH A MEAL. 07/02/15   Mihai Croitoru, MD  Cholecalciferol 1000 UNITS capsule Take 2,000 Units by mouth daily. [employer] VITAMIN D3 1000 UNIT CAPS 10/19/11   Historical Provider, MD  escitalopram  (LEXAPRO) 20 MG tablet TAKE 1 TABLET BY MOUTH ONCE DAILY 08/04/15   Historical Provider, MD  Multiple Vitamins-Minerals (MULTIVITAMIN ADULT) TABS MULTIVITAMINS TABS 08/17/09   Historical Provider, MD  simvastatin (ZOCOR) 20 MG tablet Take 20 mg by mouth daily. 03/30/10   Historical Provider, MD  triamterene-hydrochlorothiazide (MAXZIDE-25) 37.5-25 MG tablet TAKE 1 TABLET BY MOUTH DAILY. 12/29/15   Mihai Croitoru, MD   BP 123/61 mmHg  Pulse 66  Temp(Src) 98.6 F (37 C) (Oral)  Resp 18  Ht 5' 6.5" (1.689 m)  Wt 70.308 kg  BMI 24.65 kg/m2  SpO2 95%   Physical Exam  Constitutional: She is oriented to person, place, and time. She appears well-developed and well-nourished.  HENT:  Head: Normocephalic. Head is without raccoon's eyes and without Battle's sign.  Right Ear: Tympanic membrane, external ear and ear canal normal. No hemotympanum.  Left Ear: Tympanic membrane, external ear and ear canal normal. No hemotympanum.  Nose: Nose normal. No nasal septal hematoma.  Mouth/Throat: Uvula is midline, oropharynx is clear and moist and mucous membranes are normal.  2 cm, hemostatic, linear, mildly gaping laceration above the left eyebrow. Patient able to raise her eyebrows without deficit.  Eyes: Conjunctivae, EOM and lids are normal. Pupils are equal, round, and reactive to light. Right eye exhibits no nystagmus. Left eye exhibits no nystagmus.  No visible hyphema noted  Neck: Normal range of motion. Neck supple.  Cardiovascular: Normal rate and regular rhythm.   Pulmonary/Chest: Effort normal and breath sounds normal.  Abdominal: Soft. There is no tenderness.  Musculoskeletal:       Cervical back: She exhibits normal range of motion, no tenderness and no bony tenderness.       Thoracic back: She exhibits no tenderness and no bony tenderness.       Lumbar back: She exhibits no tenderness and no bony tenderness.  Neurological: She is alert and oriented to person, place, and time. She has normal  strength and normal reflexes. No cranial nerve deficit or sensory deficit. Coordination normal. GCS eye subscore is 4. GCS verbal subscore is 5. GCS motor subscore is 6.  Skin: Skin is warm and dry.  Psychiatric: She has a normal mood and affect.  Nursing note and vitals reviewed.   ED Course  Procedures (including critical care time) Imaging Review Ct Head Wo Contrast  12/30/2015  CLINICAL DATA:  82 year old female tripped in struck head in attic. Left forehead laceration. Initial encounter. EXAM: CT HEAD WITHOUT CONTRAST TECHNIQUE: Contiguous axial images were obtained from the base of the skull through the vertex without intravenous contrast. COMPARISON:  09/15/2015 and earlier. FINDINGS: Stable and well pneumatized visualized paranasal sinuses and mastoids. Orbits soft tissues appear stable. Left forehead scalp hematoma measuring 5 mm in thickness. Underlying left frontal bone intact. Interval resolved right posterior scalp hematoma with mild residual scalp scarring. No acute osseous abnormality identified. Calcified atherosclerosis at the skull  base. Stable cerebral volume. No midline shift, mass effect, or evidence of intracranial mass lesion. No ventriculomegaly. No acute intracranial hemorrhage identified. Patchy bilateral white matter hypodensity is stable. No cortically based acute infarct identified. No suspicious intracranial vascular hyperdensity. IMPRESSION: 1. Left forehead scalp soft tissue injury without underlying fracture. 2. Stable non contrast CT appearance of the brain. No acute intracranial abnormality. Electronically Signed   By: Genevie Ann M.D.   On: 12/30/2015 14:25   I have personally reviewed and evaluated these images and lab results as part of my medical decision-making.  2:08 PM Patient seen and examined. Work-up initiated. Medications ordered.   Vital signs reviewed and are as follows: BP 123/61 mmHg  Pulse 66  Temp(Src) 98.6 F (37 C) (Oral)  Resp 18  Ht 5' 6.5"  (1.689 m)  Wt 70.308 kg  BMI 24.65 kg/m2  SpO2 95%  Patient discussed with and seen by Dr. Tyrone Nine.   LACERATION REPAIR Performed by: Faustino Congress Authorized by: Faustino Congress Consent: Verbal consent obtained. Risks and benefits: risks, benefits and alternatives were discussed Consent given by: patient Patient identity confirmed: provided demographic data Prepped and Draped in normal sterile fashion Wound explored  Laceration Location: L forehead  Laceration Length: 2cm  No Foreign Bodies seen or palpated  Anesthesia: local infiltration  Local anesthetic: lidocaine 2% with epinephrine  Anesthetic total: 3 ml  Irrigation method: skin scrub with dermal cleanser Amount of cleaning: standard  Skin closure: 6-0 Ethilon  Number of sutures: 5  Technique: simple interrupted  Patient tolerance: Patient tolerated the procedure well with no immediate complications.  3:10 PM Patient counseled on wound care. Patient counseled on need to return or see PCP/urgent care for suture removal in 5 days. Patient was urged to return to the Emergency Department urgently with worsening pain, swelling, expanding erythema especially if it streaks away from the affected area, fever, or if they have any other concerns. Patient verbalized understanding.   MDM   Final diagnoses:  Minor head injury, initial encounter  Forehead laceration, initial encounter   Head injury: Normal neurological exam. Head CT is negative for intracranial bleeding or injury.  Laceration: Repaired without complication. No underlying neurovascular injury suspected.    Carlisle Cater, PA-C 12/30/15 [place], DO 12/30/15 [phone]

## 2015-12-30 NOTE — Discharge Instructions (Signed)
Please read and follow all provided instructions.  Your diagnoses today include:  1. Minor head injury, initial encounter   2. Forehead laceration, initial encounter    Tests performed today include:  CT scan of your head that did not show any serious injury.  Vital signs. See below for your results today.   Medications prescribed:   None  Take any prescribed medications only as directed.  Home care instructions:  Follow any educational materials contained in this packet.  Follow-up instructions: Please follow-up with your primary care provider in 5 days for further evaluation of your laceration and suture removal.    Return instructions:  SEEK IMMEDIATE MEDICAL ATTENTION IF:  There is confusion or drowsiness (although children frequently become drowsy after injury).   You cannot awaken the injured person.   You have more than one episode of vomiting.   You notice dizziness or unsteadiness which is getting worse, or inability to walk.   You have convulsions or unconsciousness.   You experience severe, persistent headaches not relieved by Tylenol.  You cannot use arms or legs normally.   There are changes in pupil sizes. (This is the black center in the colored part of the eye)   There is clear or bloody discharge from the nose or ears.   You have change in speech, vision, swallowing, or understanding.   Localized weakness, numbness, tingling, or change in bowel or bladder control.  You have any other emergent concerns.  Additional Information: You have had a head injury which does not appear to require admission at this time.  Your vital signs today were: BP 123/61 mmHg   Pulse 66   Temp(Src) 98.6 F (37 C) (Oral)   Resp 18   Ht 5' 6.5" (1.689 m)   Wt 70.308 kg   BMI 24.65 kg/m2   SpO2 95% If your blood pressure (BP) was elevated above 135/85 this visit, please have this repeated by your doctor within one month. --------------

## 2016-01-05 DIAGNOSIS — Z4889 Encounter for other specified surgical aftercare: Secondary | ICD-10-CM | POA: Diagnosis not present

## 2016-01-05 DIAGNOSIS — S0093XS Contusion of unspecified part of head, sequela: Secondary | ICD-10-CM | POA: Diagnosis not present

## 2016-01-07 DIAGNOSIS — M25531 Pain in right wrist: Secondary | ICD-10-CM | POA: Diagnosis not present

## 2016-01-07 DIAGNOSIS — S62101A Fracture of unspecified carpal bone, right wrist, initial encounter for closed fracture: Secondary | ICD-10-CM | POA: Diagnosis not present

## 2016-01-08 DIAGNOSIS — S52551A Other extraarticular fracture of lower end of right radius, initial encounter for closed fracture: Secondary | ICD-10-CM | POA: Diagnosis not present

## 2016-01-09 DIAGNOSIS — S52551D Other extraarticular fracture of lower end of right radius, subsequent encounter for closed fracture with routine healing: Secondary | ICD-10-CM | POA: Diagnosis not present

## 2016-01-13 DIAGNOSIS — M5441 Lumbago with sciatica, right side: Secondary | ICD-10-CM | POA: Diagnosis not present

## 2016-01-14 DIAGNOSIS — M25531 Pain in right wrist: Secondary | ICD-10-CM | POA: Diagnosis not present

## 2016-01-14 DIAGNOSIS — S62101D Fracture of unspecified carpal bone, right wrist, subsequent encounter for fracture with routine healing: Secondary | ICD-10-CM | POA: Diagnosis not present

## 2016-01-22 DIAGNOSIS — S62101D Fracture of unspecified carpal bone, right wrist, subsequent encounter for fracture with routine healing: Secondary | ICD-10-CM | POA: Diagnosis not present

## 2016-01-22 DIAGNOSIS — M25531 Pain in right wrist: Secondary | ICD-10-CM | POA: Diagnosis not present

## 2016-01-28 DIAGNOSIS — M5416 Radiculopathy, lumbar region: Secondary | ICD-10-CM | POA: Diagnosis not present

## 2016-01-28 DIAGNOSIS — G8929 Other chronic pain: Secondary | ICD-10-CM | POA: Diagnosis not present

## 2016-01-28 DIAGNOSIS — M5441 Lumbago with sciatica, right side: Secondary | ICD-10-CM | POA: Diagnosis not present

## 2016-02-11 DIAGNOSIS — S6981XD Other specified injuries of right wrist, hand and finger(s), subsequent encounter: Secondary | ICD-10-CM | POA: Diagnosis not present

## 2016-02-11 DIAGNOSIS — S52551D Other extraarticular fracture of lower end of right radius, subsequent encounter for closed fracture with routine healing: Secondary | ICD-10-CM | POA: Diagnosis not present

## 2016-02-17 DIAGNOSIS — M4806 Spinal stenosis, lumbar region: Secondary | ICD-10-CM | POA: Diagnosis not present

## 2016-02-17 DIAGNOSIS — C50919 Malignant neoplasm of unspecified site of unspecified female breast: Secondary | ICD-10-CM | POA: Diagnosis not present

## 2016-02-17 DIAGNOSIS — I10 Essential (primary) hypertension: Secondary | ICD-10-CM | POA: Diagnosis not present

## 2016-02-17 DIAGNOSIS — I872 Venous insufficiency (chronic) (peripheral): Secondary | ICD-10-CM | POA: Diagnosis not present

## 2016-02-17 DIAGNOSIS — E784 Other hyperlipidemia: Secondary | ICD-10-CM | POA: Diagnosis not present

## 2016-02-17 DIAGNOSIS — M859 Disorder of bone density and structure, unspecified: Secondary | ICD-10-CM | POA: Diagnosis not present

## 2016-02-17 DIAGNOSIS — F329 Major depressive disorder, single episode, unspecified: Secondary | ICD-10-CM | POA: Diagnosis not present

## 2016-02-17 DIAGNOSIS — R002 Palpitations: Secondary | ICD-10-CM | POA: Diagnosis not present

## 2016-02-17 DIAGNOSIS — M7122 Synovial cyst of popliteal space [Baker], left knee: Secondary | ICD-10-CM | POA: Diagnosis not present

## 2016-02-17 DIAGNOSIS — E119 Type 2 diabetes mellitus without complications: Secondary | ICD-10-CM | POA: Diagnosis not present

## 2016-02-23 DIAGNOSIS — M5416 Radiculopathy, lumbar region: Secondary | ICD-10-CM | POA: Diagnosis not present

## 2016-03-02 ENCOUNTER — Ambulatory Visit: Payer: Medicare Other | Admitting: Cardiovascular Disease

## 2016-03-10 DIAGNOSIS — S52551D Other extraarticular fracture of lower end of right radius, subsequent encounter for closed fracture with routine healing: Secondary | ICD-10-CM | POA: Diagnosis not present

## 2016-03-14 DIAGNOSIS — S62101A Fracture of unspecified carpal bone, right wrist, initial encounter for closed fracture: Secondary | ICD-10-CM | POA: Diagnosis not present

## 2016-03-15 IMAGING — CT CT ANGIO CHEST
2 of 6 series · 19 of 36 positions shown · IV contrast (APPLIED)
Comparison: Chest CT 10/25/2012

CLINICAL DATA: Patient status post fall 7 weeks prior. Multiple rib
fractures. Shortness of breath. Elevated D-dimer. Prior left breast
lumpectomy.

EXAM:
CT ANGIOGRAPHY CHEST WITH CONTRAST
TECHNIQUE: Multidetector CT imaging of the chest was performed using the
standard protocol during bolus administration of intravenous
contrast. Multiplanar CT image reconstructions and MIPs were
obtained to evaluate the vascular anatomy.
CONTRAST:  100mL OMNIPAQUE IOHEXOL 350 MG/ML SOLN

[Series 5: pe 1.0 b26f · axial · 0.66mm/px · z∈[-242,+11]mm · 18 of 283 slices shown]
[im 15/283  lung]
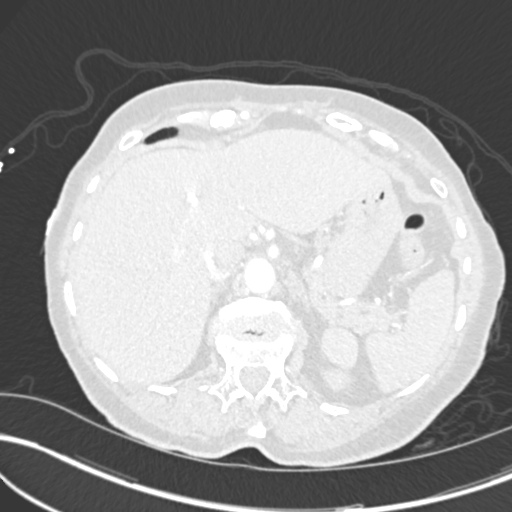
[im 29/283  mediastinal]
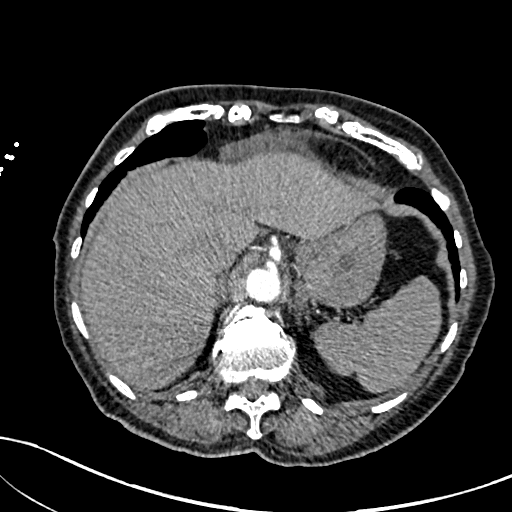
[im 43/283  lung]
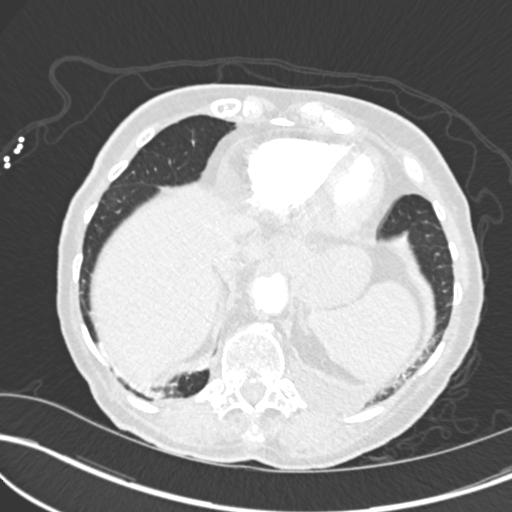
[im 57/283  mediastinal]
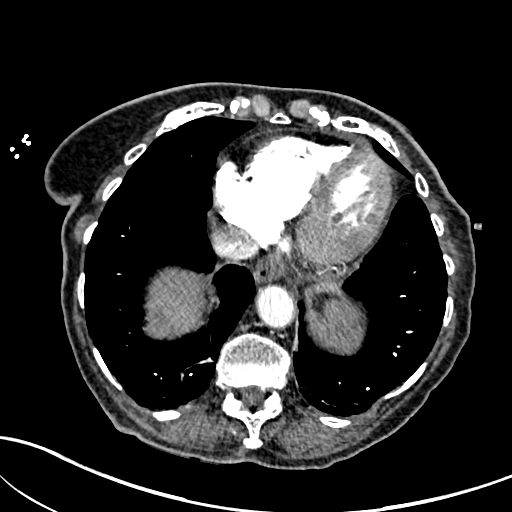
[im 71/283  lung]
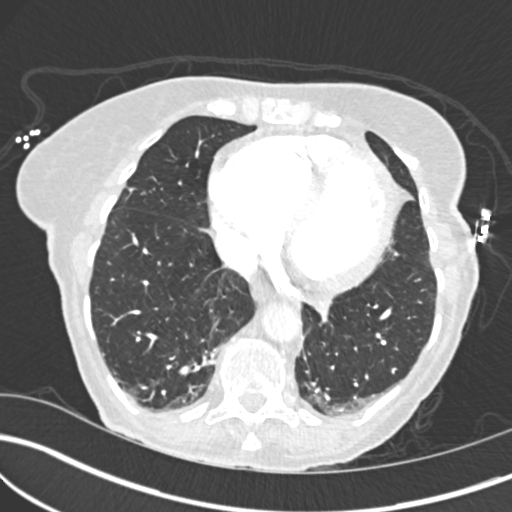
[im 85/283  mediastinal]
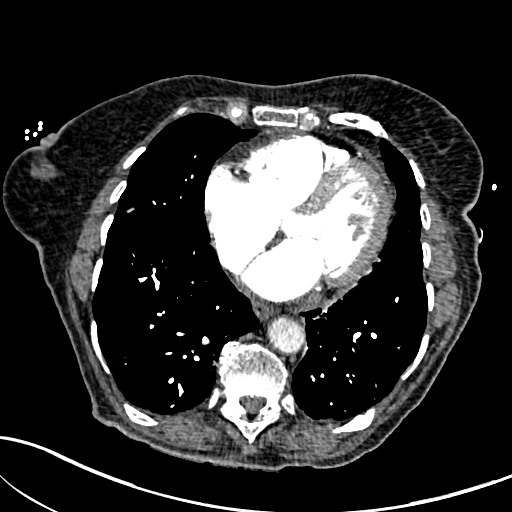
[im 99/283  lung]
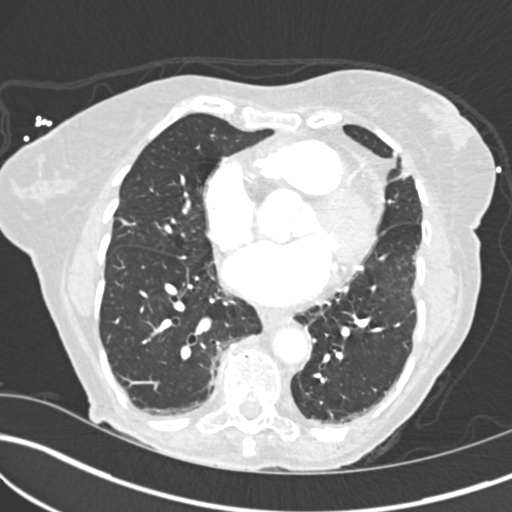
[im 113/283  mediastinal]
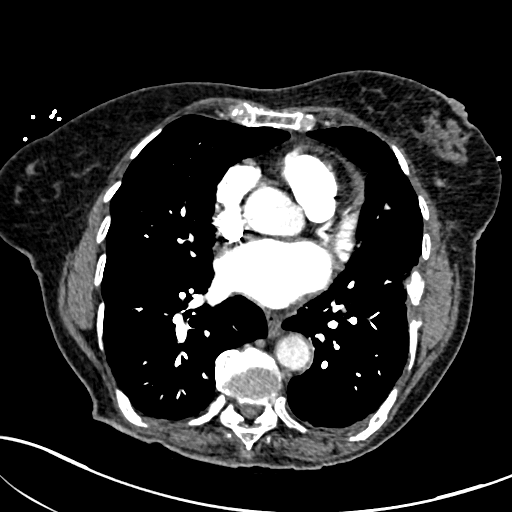
[im 127/283  lung]
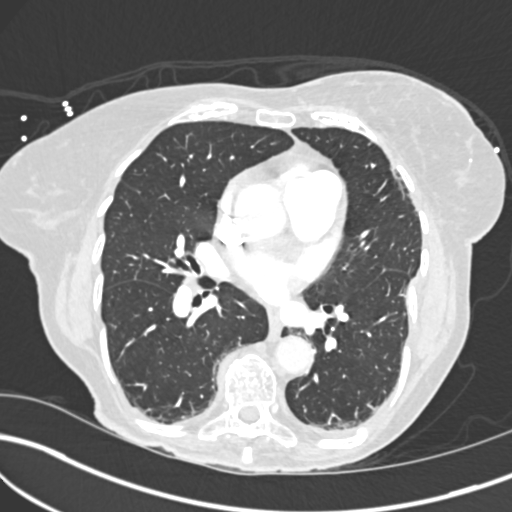
[im 156/283  mediastinal]
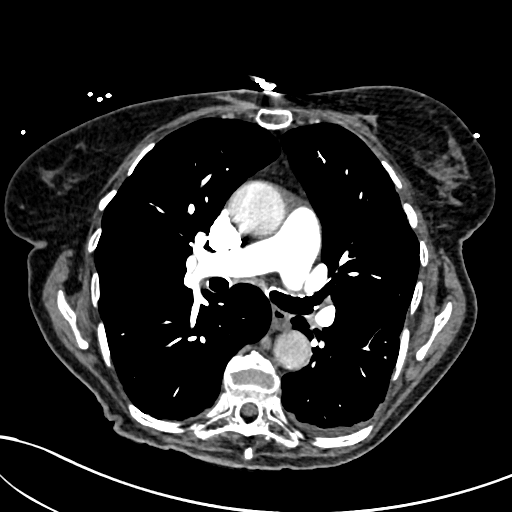
[im 170/283  lung]
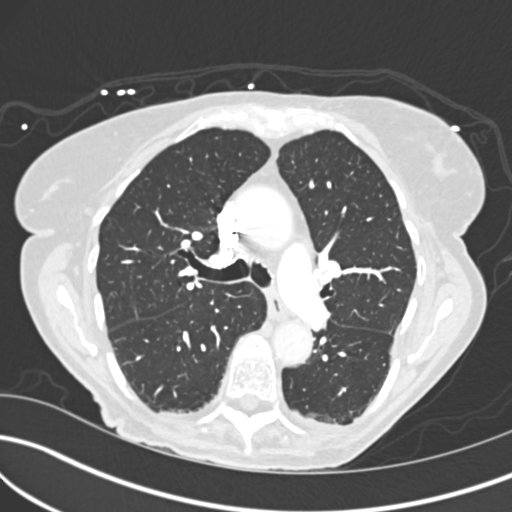
[im 184/283  mediastinal]
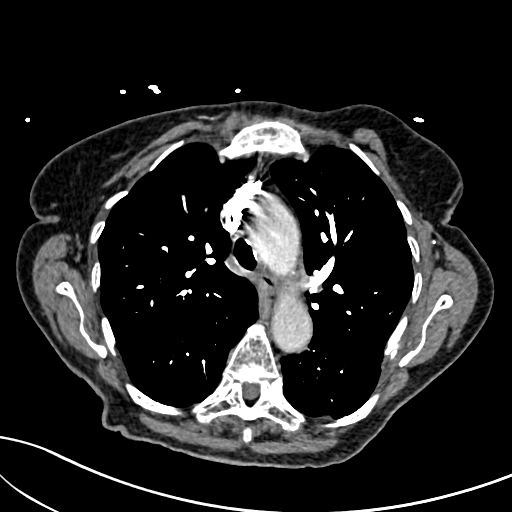
[im 198/283  lung]
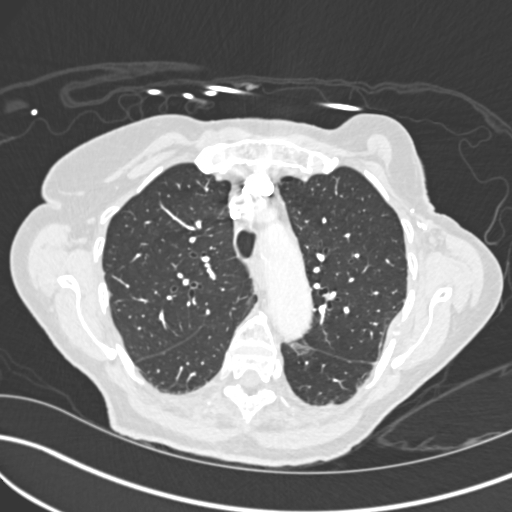
[im 212/283  mediastinal]
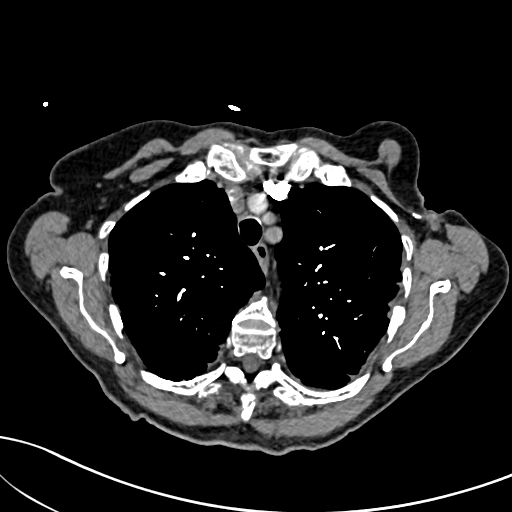
[im 226/283  lung]
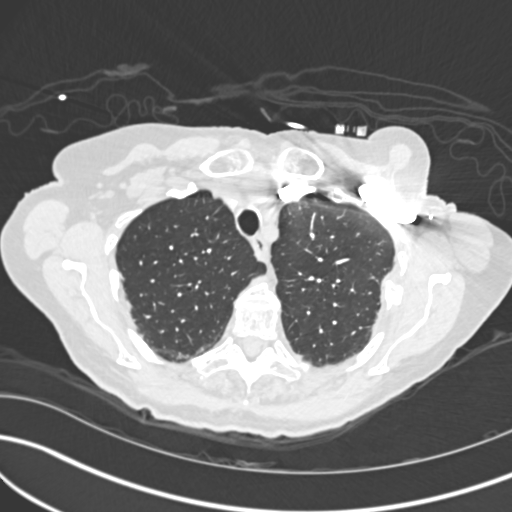
[im 240/283  mediastinal]
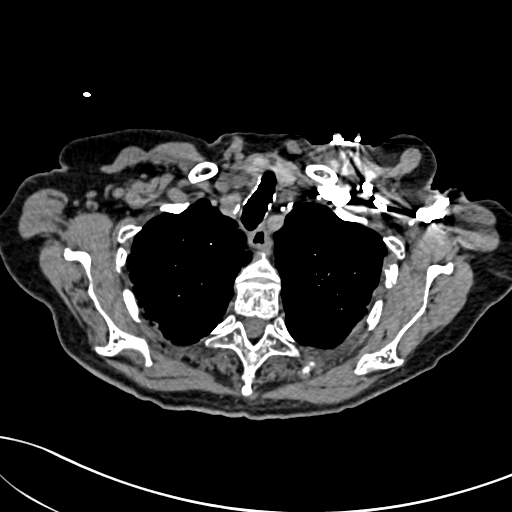
[im 254/283  lung]
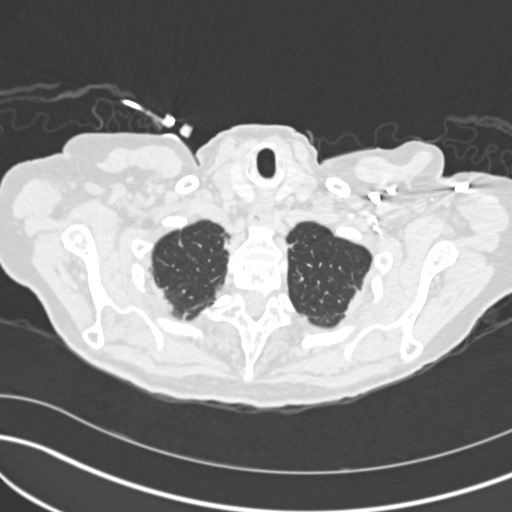
[im 268/283  mediastinal]
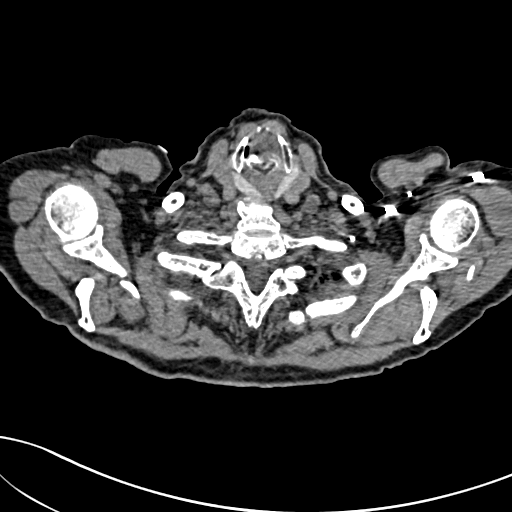

[Series 8: pe 2.0 coronal · coronal · 0.59mm/px · 1 of 125 slices shown]
[im 63/125  mediastinal]
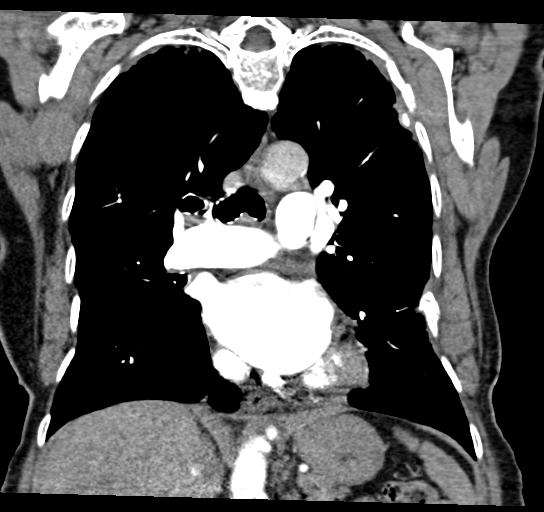

[19 of 36 positions shown; findings below may reference images not displayed]

FINDINGS: Adequate opacification of the main pulmonary artery. No evidence for
pulmonary embolism.

Mediastinum/Nodes: Heart is mildly enlarged. Trace pericardial
fluid. No enlarged axillary or mediastinal adenopathy. Postsurgical
changes left axilla.

Lungs/Pleura: Central airways are patent. Subpleural ground-glass
and consolidative opacities within the bilateral lower lobes. No
large consolidative pulmonary opacities. Trace pleural fluid.
Biapical pleural parenchymal thickening.

Upper abdomen: Unremarkable

Musculoskeletal: Multiple minimally displaced left lateral and
posterior rib fractures are demonstrated involving the third through
eleventh ribs. Age indeterminate posterior right ninth rib fracture.
Re- demonstrated kyphoplasty material within the L1 and L2 vertebral
bodies.

Review of the MIP images confirms the above findings.
IMPRESSION: No evidence for pulmonary embolism.

Multiple left-sided rib fractures.

## 2016-03-23 ENCOUNTER — Ambulatory Visit (INDEPENDENT_AMBULATORY_CARE_PROVIDER_SITE_OTHER): Payer: Medicare Other | Admitting: Cardiovascular Disease

## 2016-03-23 VITALS — BP 130/62 | HR 63 | Ht 66.5 in | Wt 160.4 lb

## 2016-03-23 DIAGNOSIS — I951 Orthostatic hypotension: Secondary | ICD-10-CM | POA: Diagnosis not present

## 2016-03-23 DIAGNOSIS — I4719 Other supraventricular tachycardia: Secondary | ICD-10-CM

## 2016-03-23 DIAGNOSIS — I10 Essential (primary) hypertension: Secondary | ICD-10-CM

## 2016-03-23 DIAGNOSIS — I872 Venous insufficiency (chronic) (peripheral): Secondary | ICD-10-CM

## 2016-03-23 DIAGNOSIS — I519 Heart disease, unspecified: Secondary | ICD-10-CM

## 2016-03-23 DIAGNOSIS — I471 Supraventricular tachycardia: Secondary | ICD-10-CM

## 2016-03-23 DIAGNOSIS — I5189 Other ill-defined heart diseases: Secondary | ICD-10-CM

## 2016-03-23 MED ORDER — TRIAMTERENE-HCTZ 75-50 MG PO TABS: ORAL_TABLET | ORAL | 11 refills | Status: DC

## 2016-03-23 NOTE — Patient Instructions (Signed)
Dr Sallyanne Kuster has recommended making the following medication changes: 1. STOP Carvedilol 2. DECREASE Triamterene-HCT - take 0.5 tablet alternating with 1 tablet daily  Your physician has requested that you regularly monitor and record your blood pressure readings at home. Take your blood pressure sitting and standing. Please use the same machine at the same time of day to check your readings and record them to bring to your follow-up visit. Please contact the office next week either by calling to speak with a nurse or using mychart to report your blood pressure readings.  Dr Sallyanne Kuster recommends that you schedule a follow-up appointment in 1 year. You will receive a reminder letter in the mail two months in advance. If you don't receive a letter, please call our office to schedule the follow-up appointment.  If you need a refill on your cardiac medications before your next appointment, please call your pharmacy.

## 2016-03-23 NOTE — Progress Notes (Signed)
Cardiology Office Note    Date:  03/24/2016   ID:  YINUO DEDMAN, DOB [birth year], MRN [id number]  PCP:  Geoffery Lyons, MD  Cardiologist:   Sanda Klein, MD   Chief Complaint  Patient presents with  . Follow-up    History of Present Illness:  [name] is a 82 y.o. female with orthostatic hypotension, hyperlipidemia and lower extremity edema due to venous insufficiency, returning for follow-up. Since I last saw her she's had a couple of falls. She insists that these were "trips", mechanical falls. However we have documented orthostatic hypotension repeatedly in the past. She has not had loss of consciousness and thankfully did not have serious injuries, although on at least one occasion she hit her head. She denies shortness of breath and chest discomfort. As recommended, she reduced the dose of diuretic in half, but she then developed leg edema and increased the dose back up.  In the office today her blood pressure was 130/60 sitting up and immediately dropped to 100/50 standing up. She has a history of DVT and has prominent bilateral varicose veins.  Previous event monitor showed occasional PVCs and brief runs of paroxysmal atrial tachycardia at 150 bpm which were asymptomatic. A low dose of beta blocker was prescribed for this. She has not been troubled by palpitations at all.  About a year ago her echocardiogram showed normal findings with a session of moderate left atrial enlargement and mild diastolic left ventricular dysfunction.  Past Medical History:  Diagnosis Date  . Arthritis    "in my back"  . Breast cancer ([place]) 1996   left breast cancer   . Complication of anesthesia 04/18/2012   "didn't tolerate it today very well; had the shakes and very hard time w/it"  . Depression 11/20/2012  . DVT (deep venous thrombosis) ([place])   . High cholesterol   . History of alcoholism ([place])    7 1/2 years clean  . Hypertension   . Osteoporosis   . Peripheral vascular disease  (HCC)    hx of ligation     Past Surgical History:  Procedure Laterality Date  . BREAST LUMPECTOMY Left 1996  . I&D EXTREMITY  04/17/2012   Procedure: IRRIGATION AND DEBRIDEMENT EXTREMITY;  Surgeon: Rudean Haskell, MD;  Location: [place];  Service: Orthopedics;  Laterality: Right;  . KNEE ARTHROSCOPY  04/17/2012   Procedure: ARTHROSCOPY KNEE;  Surgeon: Rudean Haskell, MD;  Location: [place];  Service: Orthopedics;  Laterality: Right;  . KYPHOPLASTY  2009, 2013   thorasic, lumbar  . MENISECTOMY Right 04/11/2012  . POSTERIOR LAMINECTOMY / DECOMPRESSION LUMBAR SPINE  1980's  . rotator cuff surgery  Right   . TONSILLECTOMY AND ADENOIDECTOMY     "I was a child"  . TOTAL KNEE ARTHROPLASTY Right 10/29/2012   Procedure: RIGHT TOTAL KNEE ARTHROPLASTY;  Surgeon: Gearlean Alf, MD;  [facility];  Service: Orthopedics;  Laterality: Right;  . VEIN LIGATION      Current Medications: Outpatient Medications Prior to Visit  Medication Sig Dispense Refill  . alendronate (FOSAMAX) 70 MG tablet Take 70 mg by mouth once a week. Every Sunday.    Marland Kitchen amoxicillin (AMOXIL) 500 MG capsule FOR DENTAL USE ONLY  1  . Artificial Tear Ointment (DRY EYES OP) Apply 1 drop to eye daily as needed (dry eyes).    . Cholecalciferol 1000 UNITS capsule Take 2,000 Units by mouth daily. [employer] VITAMIN D3 1000 UNIT CAPS    . escitalopram (LEXAPRO)  20 MG tablet TAKE 1 TABLET BY MOUTH ONCE DAILY  5  . Multiple Vitamins-Minerals (MULTIVITAMIN ADULT) TABS MULTIVITAMINS TABS    . simvastatin (ZOCOR) 20 MG tablet Take 20 mg by mouth daily.    . carvedilol (COREG) 3.125 MG tablet TAKE 1 TABLET (3.125 MG TOTAL) BY MOUTH 2 (TWO) TIMES DAILY WITH A MEAL. 60 tablet 9  . triamterene-hydrochlorothiazide (MAXZIDE-25) 37.5-25 MG tablet TAKE 1 TABLET BY MOUTH DAILY. 90 tablet 3   No facility-administered medications prior to visit.      Allergies:   Oysters [shellfish allergy] and Codeine   Social History   Social History  . Marital  status: Married    Spouse name: N/A  . Number of children: 2  . Years of education: N/A   Occupational History  . retired    Social History Main Topics  . Smoking status: Never Smoker  . Smokeless tobacco: Never Used  . Alcohol use No  . Drug use: No  . Sexual activity: Not on file   Other Topics Concern  . Not on file   Social History Narrative  . No narrative on file     Family History:  The patient's family history includes Anuerysm in her father; Kidney failure in her mother.   ROS:   Please see the history of present illness.    ROS All other systems reviewed and are negative.   PHYSICAL EXAM:   VS:  BP 130/62 (BP Location: Right Arm, Patient Position: Sitting, Cuff Size: Normal)   Pulse 63   Ht 5' 6.5" (1.689 m)   Wt 160 lb 6.4 oz (72.8 kg)   BMI 25.50 kg/m    GEN: Well nourished, well developed, in no acute distress  HEENT: normal  Neck: no JVD, carotid bruits, or masses Cardiac: RRRWith occasional isolated ectopy; no murmurs, rubs, or gallops,no edema; prominent bilateral varicose veins  Respiratory:  clear to auscultation bilaterally, normal work of breathing GI: soft, nontender, nondistended, + BS MS: no deformity or atrophy  Skin: warm and dry, no rash Neuro:  Alert and Oriented x 3, Strength and sensation are intact Psych: euthymic mood, full affect  Wt Readings from Last 3 Encounters:  03/23/16 160 lb 6.4 oz (72.8 kg)  12/30/15 155 lb (70.3 kg)  10/18/15 155 lb (70.3 kg)      Studies/Labs Reviewed:   EKG:  EKG is ordered today.  The ekg ordered today demonstrates Sinus rhythm with 2 PVCs and minor nonspecific ST changes, QTc 450 ms    ASSESSMENT:    1. Orthostatic hypotension   2. Essential hypertension, benign   3. PAT (paroxysmal atrial tachycardia) ([place])   4. Diastolic dysfunction without heart failure   5. Peripheral venous insufficiency      PLAN:  In order of problems listed above:  1. Orthostatic hypotension: Despite her  assertions that all her falls are due to "tripping". I remain concerned that they may be related to symptomatic orthostatic hypotension. I have advised that she discontinue the carvedilol and recommended that she try taking a lower dose of diuretic, at least on a "every other day" basis. I suspect that most of her edema is secondary to venous insufficiency rather than true hypervolemia 2. HTN: (too) well controlled, I think we will have to allow her systolic blood pressure to run around 150 to avoid falls/syncope. 3. PAT: Asymptomatic. Discontinue carvedilol 4. LV diastolic dysfunction: This was identified by echo but there is no convincing evidence that she has true diastolic  heart failure. 5. Peripheral venous insufficiency: I think this is the dominant reason for her leg edema and have recommended that she wear compression stockings. This will help with both swelling and orthostatic hypotension. She doesn't like that idea and I'm skeptical that she will follow through.  Medication Adjustments/Labs and Tests Ordered: Current medicines are reviewed at length with the patient today.  Concerns regarding medicines are outlined above.  Medication changes, Labs and Tests ordered today are listed in the Patient Instructions below. Patient Instructions  Dr Sallyanne Kuster has recommended making the following medication changes: 1. STOP Carvedilol 2. DECREASE Triamterene-HCT - take 0.5 tablet alternating with 1 tablet daily  Your physician has requested that you regularly monitor and record your blood pressure readings at home. Take your blood pressure sitting and standing. Please use the same machine at the same time of day to check your readings and record them to bring to your follow-up visit. Please contact the office next week either by calling to speak with a nurse or using mychart to report your blood pressure readings.  Dr Sallyanne Kuster recommends that you schedule a follow-up appointment in 1 year. You will  receive a reminder letter in the mail two months in advance. If you don't receive a letter, please call our office to schedule the follow-up appointment.  If you need a refill on your cardiac medications before your next appointment, please call your pharmacy.    Signed, Sanda Klein, MD  03/24/2016 6:14 PM    [facility] [address], [place], [place]  [address] Phone: [phone]; Fax: [phone]

## 2016-03-24 ENCOUNTER — Encounter: Payer: Self-pay | Admitting: Cardiovascular Disease

## 2016-03-24 DIAGNOSIS — I5189 Other ill-defined heart diseases: Secondary | ICD-10-CM | POA: Insufficient documentation

## 2016-03-24 DIAGNOSIS — I872 Venous insufficiency (chronic) (peripheral): Secondary | ICD-10-CM | POA: Insufficient documentation

## 2016-03-24 DIAGNOSIS — I471 Supraventricular tachycardia: Secondary | ICD-10-CM | POA: Insufficient documentation

## 2016-03-31 DIAGNOSIS — S62101D Fracture of unspecified carpal bone, right wrist, subsequent encounter for fracture with routine healing: Secondary | ICD-10-CM | POA: Diagnosis not present

## 2016-04-01 ENCOUNTER — Telehealth: Payer: Self-pay

## 2016-04-01 NOTE — Telephone Encounter (Signed)
Patient brought blood pressures into the office per last office note. Per MCr - keep same meds. Called patient with recommendations - patient verbalized understanding.

## 2016-04-04 DIAGNOSIS — S62101D Fracture of unspecified carpal bone, right wrist, subsequent encounter for fracture with routine healing: Secondary | ICD-10-CM | POA: Diagnosis not present

## 2016-04-05 DIAGNOSIS — M5416 Radiculopathy, lumbar region: Secondary | ICD-10-CM | POA: Diagnosis not present

## 2016-04-06 DIAGNOSIS — S62101A Fracture of unspecified carpal bone, right wrist, initial encounter for closed fracture: Secondary | ICD-10-CM | POA: Diagnosis not present

## 2016-04-07 DIAGNOSIS — S52551D Other extraarticular fracture of lower end of right radius, subsequent encounter for closed fracture with routine healing: Secondary | ICD-10-CM | POA: Diagnosis not present

## 2016-04-07 DIAGNOSIS — M25531 Pain in right wrist: Secondary | ICD-10-CM | POA: Diagnosis not present

## 2016-04-11 DIAGNOSIS — S62101A Fracture of unspecified carpal bone, right wrist, initial encounter for closed fracture: Secondary | ICD-10-CM | POA: Diagnosis not present

## 2016-04-14 DIAGNOSIS — S62101D Fracture of unspecified carpal bone, right wrist, subsequent encounter for fracture with routine healing: Secondary | ICD-10-CM | POA: Diagnosis not present

## 2016-04-18 DIAGNOSIS — S62101D Fracture of unspecified carpal bone, right wrist, subsequent encounter for fracture with routine healing: Secondary | ICD-10-CM | POA: Diagnosis not present

## 2016-04-21 DIAGNOSIS — S62101D Fracture of unspecified carpal bone, right wrist, subsequent encounter for fracture with routine healing: Secondary | ICD-10-CM | POA: Diagnosis not present

## 2016-04-25 DIAGNOSIS — S62101D Fracture of unspecified carpal bone, right wrist, subsequent encounter for fracture with routine healing: Secondary | ICD-10-CM | POA: Diagnosis not present

## 2016-04-26 ENCOUNTER — Other Ambulatory Visit: Payer: Self-pay | Admitting: Internal Medicine

## 2016-04-26 DIAGNOSIS — Z1231 Encounter for screening mammogram for malignant neoplasm of breast: Secondary | ICD-10-CM

## 2016-04-28 DIAGNOSIS — S62101D Fracture of unspecified carpal bone, right wrist, subsequent encounter for fracture with routine healing: Secondary | ICD-10-CM | POA: Diagnosis not present

## 2016-05-12 DIAGNOSIS — S62101A Fracture of unspecified carpal bone, right wrist, initial encounter for closed fracture: Secondary | ICD-10-CM | POA: Diagnosis not present

## 2016-05-20 ENCOUNTER — Ambulatory Visit
Admission: RE | Admit: 2016-05-20 | Discharge: 2016-05-20 | Disposition: A | Discharge status: Home / Self Care | Payer: Medicare Other | Source: Ambulatory Visit | Attending: Internal Medicine | Admitting: Internal Medicine

## 2016-05-20 DIAGNOSIS — Z1231 Encounter for screening mammogram for malignant neoplasm of breast: Secondary | ICD-10-CM | POA: Diagnosis not present

## 2016-05-24 DIAGNOSIS — S52551D Other extraarticular fracture of lower end of right radius, subsequent encounter for closed fracture with routine healing: Secondary | ICD-10-CM | POA: Diagnosis not present

## 2016-06-27 DIAGNOSIS — M859 Disorder of bone density and structure, unspecified: Secondary | ICD-10-CM | POA: Diagnosis not present

## 2016-06-27 DIAGNOSIS — I872 Venous insufficiency (chronic) (peripheral): Secondary | ICD-10-CM | POA: Diagnosis not present

## 2016-06-27 DIAGNOSIS — R002 Palpitations: Secondary | ICD-10-CM | POA: Diagnosis not present

## 2016-06-27 DIAGNOSIS — M7122 Synovial cyst of popliteal space [Baker], left knee: Secondary | ICD-10-CM | POA: Diagnosis not present

## 2016-06-27 DIAGNOSIS — I10 Essential (primary) hypertension: Secondary | ICD-10-CM | POA: Diagnosis not present

## 2016-06-27 DIAGNOSIS — Z23 Encounter for immunization: Secondary | ICD-10-CM | POA: Diagnosis not present

## 2016-06-27 DIAGNOSIS — F329 Major depressive disorder, single episode, unspecified: Secondary | ICD-10-CM | POA: Diagnosis not present

## 2016-06-27 DIAGNOSIS — Z6825 Body mass index (BMI) 25.0-25.9, adult: Secondary | ICD-10-CM | POA: Diagnosis not present

## 2016-06-27 DIAGNOSIS — E784 Other hyperlipidemia: Secondary | ICD-10-CM | POA: Diagnosis not present

## 2016-06-27 DIAGNOSIS — E119 Type 2 diabetes mellitus without complications: Secondary | ICD-10-CM | POA: Diagnosis not present

## 2016-07-22 DIAGNOSIS — R202 Paresthesia of skin: Secondary | ICD-10-CM | POA: Diagnosis not present

## 2016-07-22 DIAGNOSIS — R200 Anesthesia of skin: Secondary | ICD-10-CM | POA: Diagnosis not present

## 2016-07-22 DIAGNOSIS — S52551D Other extraarticular fracture of lower end of right radius, subsequent encounter for closed fracture with routine healing: Secondary | ICD-10-CM | POA: Diagnosis not present

## 2016-08-01 DIAGNOSIS — R202 Paresthesia of skin: Secondary | ICD-10-CM | POA: Diagnosis not present

## 2016-08-01 DIAGNOSIS — R200 Anesthesia of skin: Secondary | ICD-10-CM | POA: Diagnosis not present

## 2016-08-05 DIAGNOSIS — Z6825 Body mass index (BMI) 25.0-25.9, adult: Secondary | ICD-10-CM | POA: Diagnosis not present

## 2016-08-05 DIAGNOSIS — L988 Other specified disorders of the skin and subcutaneous tissue: Secondary | ICD-10-CM | POA: Diagnosis not present

## 2016-08-12 DIAGNOSIS — G5602 Carpal tunnel syndrome, left upper limb: Secondary | ICD-10-CM | POA: Diagnosis not present

## 2016-08-12 DIAGNOSIS — G5601 Carpal tunnel syndrome, right upper limb: Secondary | ICD-10-CM | POA: Diagnosis not present

## 2016-08-16 ENCOUNTER — Emergency Department (HOSPITAL_BASED_OUTPATIENT_CLINIC_OR_DEPARTMENT_OTHER)
Admission: EM | Admit: 2016-08-16 | Discharge: 2016-08-16 | Disposition: A | Discharge status: Home / Self Care | Payer: Medicare Other | Attending: Emergency Medicine | Admitting: Emergency Medicine

## 2016-08-16 ENCOUNTER — Encounter (HOSPITAL_BASED_OUTPATIENT_CLINIC_OR_DEPARTMENT_OTHER): Payer: Self-pay | Admitting: *Deleted

## 2016-08-16 ENCOUNTER — Emergency Department (HOSPITAL_BASED_OUTPATIENT_CLINIC_OR_DEPARTMENT_OTHER): Payer: Medicare Other

## 2016-08-16 DIAGNOSIS — W1839XA Other fall on same level, initial encounter: Secondary | ICD-10-CM | POA: Diagnosis not present

## 2016-08-16 DIAGNOSIS — I10 Essential (primary) hypertension: Secondary | ICD-10-CM | POA: Diagnosis not present

## 2016-08-16 DIAGNOSIS — S069X9A Unspecified intracranial injury with loss of consciousness of unspecified duration, initial encounter: Secondary | ICD-10-CM | POA: Diagnosis not present

## 2016-08-16 DIAGNOSIS — Y92000 Kitchen of unspecified non-institutional (private) residence as  the place of occurrence of the external cause: Secondary | ICD-10-CM | POA: Diagnosis not present

## 2016-08-16 DIAGNOSIS — W19XXXA Unspecified fall, initial encounter: Secondary | ICD-10-CM

## 2016-08-16 DIAGNOSIS — Y999 Unspecified external cause status: Secondary | ICD-10-CM | POA: Diagnosis not present

## 2016-08-16 DIAGNOSIS — S0083XA Contusion of other part of head, initial encounter: Secondary | ICD-10-CM | POA: Diagnosis not present

## 2016-08-16 DIAGNOSIS — S61412A Laceration without foreign body of left hand, initial encounter: Secondary | ICD-10-CM | POA: Diagnosis not present

## 2016-08-16 DIAGNOSIS — Y939 Activity, unspecified: Secondary | ICD-10-CM | POA: Insufficient documentation

## 2016-08-16 DIAGNOSIS — Z853 Personal history of malignant neoplasm of breast: Secondary | ICD-10-CM | POA: Insufficient documentation

## 2016-08-16 DIAGNOSIS — S0990XA Unspecified injury of head, initial encounter: Secondary | ICD-10-CM | POA: Diagnosis present

## 2016-08-16 HISTORY — DX: Spinal stenosis, site unspecified: M48.00

## 2016-08-16 NOTE — ED Notes (Signed)
Pt has a ride. Verbalizes understanding of wound care

## 2016-08-16 NOTE — ED Notes (Signed)
ED Provider at bedside. 

## 2016-08-16 NOTE — ED Provider Notes (Signed)
[facility] DEPT Provider Note   CSN: [id number] Arrival date & time: 08/16/16  0806     History   Chief Complaint Chief Complaint  Patient presents with  . Fall    HPI [name] is a 83 y.o. female.  HPI   Presents with concern for fall last night without LOC. Patient reports that she was making dinner last night she lost balance and fell, breaking plates in her hands. Reports a wound to her left hand which she irrigated and placed a Band-Aid over. Denies significant hand pain over area.  Reports hitting head with contusion to top of head. Reports headache around this area. Denies nausea/vomiting. Denies numbness or weakness at time of fall at this time. Denies chest pain, shortness of breath, recent illness or other balance issues. Reports pain is mild. Denies any other neck pain back pain. Reports her husband helped her stand back up after the fall. Is not on blood thinners.  Past Medical History:  Diagnosis Date  . Arthritis    "in my back"  . Breast cancer ([place]) 1996   left breast cancer   . Complication of anesthesia 04/18/2012   "didn't tolerate it today very well; had the shakes and very hard time w/it"  . Depression 11/20/2012  . DVT (deep venous thrombosis) ([place])   . High cholesterol   . History of alcoholism ([place])    7 1/2 years clean  . Hypertension   . Osteoporosis   . Peripheral vascular disease ([facility])    hx of ligation   . Spinal stenosis     Patient Active Problem List   Diagnosis Date Noted  . PAT (paroxysmal atrial tachycardia) ([place]) 03/24/2016  . Diastolic dysfunction without heart failure 03/24/2016  . Peripheral venous insufficiency 03/24/2016  . Closed wedge compression fracture of fourth lumbar vertebra ([place])   . Near syncope 10/20/2014  . Orthostatic hypotension 10/20/2014  . Fatigue 10/19/2014  . Syncope 10/19/2014  . Generalized weakness   . Elevated troponin   . Dyspnea on exertion   . Rib fracture 09/05/2014  . HX: breast cancer  09/26/2013  . Depression 11/20/2012  . Unspecified constipation 11/20/2012  . Other and unspecified hyperlipidemia 11/20/2012  . Essential hypertension, benign 11/20/2012  . Postoperative anemia due to acute blood loss 11/01/2012  . Hyponatremia 11/01/2012  . Hypokalemia 11/01/2012  . OA (osteoarthritis) of knee 10/29/2012  . Septic joint of right knee joint ([place]) 05/23/2012  . Elevated liver enzymes 05/23/2012    Past Surgical History:  Procedure Laterality Date  . BREAST LUMPECTOMY Left 1996  . I&D EXTREMITY  04/17/2012   Procedure: IRRIGATION AND DEBRIDEMENT EXTREMITY;  Surgeon: Rudean Haskell, MD;  Location: [facility];  Service: Orthopedics;  Laterality: Right;  . KNEE ARTHROSCOPY  04/17/2012   Procedure: ARTHROSCOPY KNEE;  Surgeon: Rudean Haskell, MD;  Location: [facility];  Service: Orthopedics;  Laterality: Right;  . KYPHOPLASTY  2009, 2013   thorasic, lumbar  . MENISECTOMY Right 04/11/2012  . POSTERIOR LAMINECTOMY / DECOMPRESSION LUMBAR SPINE  1980's  . rotator cuff surgery  Right   . TONSILLECTOMY AND ADENOIDECTOMY     "I was a child"  . TOTAL KNEE ARTHROPLASTY Right 10/29/2012   Procedure: RIGHT TOTAL KNEE ARTHROPLASTY;  Surgeon: Gearlean Alf, MD;  Location: [facility];  Service: Orthopedics;  Laterality: Right;  . VEIN LIGATION      OB History    No data available       Home Medications  Prior to Admission medications   Medication Sig Start Date End Date Taking? Authorizing Provider  alendronate (FOSAMAX) 70 MG tablet Take 70 mg by mouth once a week. Every Sunday. 08/21/13  Yes Historical Provider, MD  Artificial Tear Ointment (DRY EYES OP) Apply 1 drop to eye daily as needed (dry eyes).   Yes Historical Provider, MD  Cholecalciferol 1000 UNITS capsule Take 2,000 Units by mouth daily. [facility] VITAMIN D3 1000 UNIT CAPS 10/19/11  Yes Historical Provider, MD  escitalopram (LEXAPRO) 20 MG tablet TAKE 1 TABLET BY MOUTH ONCE DAILY 08/04/15  Yes Historical Provider, MD  Multiple  Vitamins-Minerals (MULTIVITAMIN ADULT) TABS MULTIVITAMINS TABS 08/17/09  Yes Historical Provider, MD  simvastatin (ZOCOR) 20 MG tablet Take 20 mg by mouth daily. 03/30/10  Yes Historical Provider, MD  triamterene-hydrochlorothiazide (MAXZIDE) 75-50 MG tablet Take 0.5-1 tablet by mouth daily as directed. 03/23/16  Yes Mihai Croitoru, MD  amoxicillin (AMOXIL) 500 MG capsule FOR DENTAL USE ONLY 11/16/14   Historical Provider, MD    Family History Family History  Problem Relation Age of Onset  . Kidney failure Mother   . Anuerysm Father     AAA    Social History Social History  Substance Use Topics  . Smoking status: Never Smoker  . Smokeless tobacco: Never Used  . Alcohol use No     Allergies   Oysters [shellfish allergy] and Codeine   Review of Systems Review of Systems  Constitutional: Negative for fever.  HENT: Negative for sore throat.   Eyes: Negative for visual disturbance.  Respiratory: Negative for cough and shortness of breath.   Cardiovascular: Negative for chest pain.  Gastrointestinal: Negative for abdominal pain.  Genitourinary: Negative for difficulty urinating.  Musculoskeletal: Negative for back pain and neck pain.  Skin: Positive for wound. Negative for rash.  Neurological: Positive for headaches. Negative for syncope.     Physical Exam Updated Vital Signs BP 149/75   Pulse 73   Temp 98.1 F (36.7 C) (Oral)   Resp 18   SpO2 94%   Physical Exam  Constitutional: She is oriented to person, place, and time. She appears well-developed and well-nourished. No distress.  HENT:  Head: Normocephalic.  Contusion top of head  Eyes: Conjunctivae and EOM are normal.  Neck: Normal range of motion.  Cardiovascular: Normal rate, regular rhythm, normal heart sounds and intact distal pulses.  Exam reveals no gallop and no friction rub.   No murmur heard. Pulmonary/Chest: Effort normal and breath sounds normal. No respiratory distress. She has no wheezes. She has no  rales.  Abdominal: Soft. She exhibits no distension. There is no tenderness. There is no guarding.  Musculoskeletal: She exhibits no edema or tenderness.       Cervical back: She exhibits no bony tenderness.       Thoracic back: She exhibits no bony tenderness.       Lumbar back: She exhibits no bony tenderness.       Left hand: She exhibits laceration (3cm through subcutaneous tissue, exposed fascia, no visualized tendons or tendon injury, no sign of foreign body). She exhibits no tenderness and no bony tenderness. Normal sensation noted. Normal strength noted.  Neurological: She is alert and oriented to person, place, and time. She has normal strength. No cranial nerve deficit or sensory deficit. Gait normal. GCS eye subscore is 4. GCS verbal subscore is 5. GCS motor subscore is 6.  Skin: Skin is warm and dry. Ecchymosis (head) and laceration (hand) noted. No rash noted. She  is not diaphoretic. No erythema.  Nodule right lower leg (pt to follow up with derm)  Nursing note and vitals reviewed.    ED Treatments / Results  Labs (all labs ordered are listed, but only abnormal results are displayed) Labs Reviewed - No data to display  EKG  EKG Interpretation None       Radiology No results found.  Procedures Procedures (including critical care time)  Medications Ordered in ED Medications - No data to display   Initial Impression / Assessment and Plan / ED Course  I have reviewed the triage vital signs and the nursing notes.  Pertinent labs & imaging results that were available during my care of the patient were reviewed by me and considered in my medical decision making (see chart for details).    83 year old female presents with concern for fall from standing last night with headache and hand wound. Given duration that hand wound has been open, do not feel it is appropriate for ice suture repair, however given every is gaping, we will place Steri-Strips for loose approximation  of wound. There is no sign of tendon injury, no bony tenderness and doubt fracture. No sign of foreign body and exam. She is neurovascularly intact. She is up-to-date on tetanus. Wound is irrigated and Steri-Strips placed. Patient rotating her head, and head CT was done which was within normal limits. Patient denies any other injuries from fall, including no neck pain, no back pain. Denies any strokelike symptoms, and has normal neurologic exam, denies syncope, and have low suspicion for acute medical cause of fall.  Recommend pt follow up with PCP. Patient discharged in stable condition with understanding of reasons to return.   Final Clinical Impressions(s) / ED Diagnoses   Final diagnoses:  Laceration of left hand without foreign body, initial encounter  Fall, initial encounter    New Prescriptions Discharge Medication List as of 08/16/2016  9:50 AM       Gareth Morgan, MD 08/18/16 1045

## 2016-08-16 NOTE — ED Triage Notes (Addendum)
Pt states she lost her balance and fell in kitchen this morning. C/o pain in left hand (skin tear per pt report with bandage in place) and and states "I grazed my head". Pt ambulatory to room with cane. Denies LOC

## 2016-08-19 DIAGNOSIS — G5602 Carpal tunnel syndrome, left upper limb: Secondary | ICD-10-CM | POA: Diagnosis not present

## 2016-08-19 DIAGNOSIS — G5601 Carpal tunnel syndrome, right upper limb: Secondary | ICD-10-CM | POA: Diagnosis not present

## 2016-08-24 ENCOUNTER — Other Ambulatory Visit: Payer: Self-pay | Admitting: Dermatology

## 2016-08-24 DIAGNOSIS — I872 Venous insufficiency (chronic) (peripheral): Secondary | ICD-10-CM | POA: Diagnosis not present

## 2016-08-24 DIAGNOSIS — D0471 Carcinoma in situ of skin of right lower limb, including hip: Secondary | ICD-10-CM | POA: Diagnosis not present

## 2016-08-24 DIAGNOSIS — C44722 Squamous cell carcinoma of skin of right lower limb, including hip: Secondary | ICD-10-CM | POA: Diagnosis not present

## 2016-09-08 DIAGNOSIS — C44722 Squamous cell carcinoma of skin of right lower limb, including hip: Secondary | ICD-10-CM | POA: Diagnosis not present

## 2016-09-08 DIAGNOSIS — D0471 Carcinoma in situ of skin of right lower limb, including hip: Secondary | ICD-10-CM | POA: Diagnosis not present

## 2016-11-01 DIAGNOSIS — C44699 Other specified malignant neoplasm of skin of left upper limb, including shoulder: Secondary | ICD-10-CM | POA: Diagnosis not present

## 2016-11-01 DIAGNOSIS — D485 Neoplasm of uncertain behavior of skin: Secondary | ICD-10-CM | POA: Diagnosis not present

## 2016-11-21 DIAGNOSIS — G5602 Carpal tunnel syndrome, left upper limb: Secondary | ICD-10-CM | POA: Diagnosis not present

## 2016-12-05 DIAGNOSIS — Z4789 Encounter for other orthopedic aftercare: Secondary | ICD-10-CM | POA: Diagnosis not present

## 2016-12-05 DIAGNOSIS — G5602 Carpal tunnel syndrome, left upper limb: Secondary | ICD-10-CM | POA: Diagnosis not present

## 2016-12-28 DIAGNOSIS — E784 Other hyperlipidemia: Secondary | ICD-10-CM | POA: Diagnosis not present

## 2016-12-28 DIAGNOSIS — I10 Essential (primary) hypertension: Secondary | ICD-10-CM | POA: Diagnosis not present

## 2016-12-28 DIAGNOSIS — E119 Type 2 diabetes mellitus without complications: Secondary | ICD-10-CM | POA: Diagnosis not present

## 2016-12-28 DIAGNOSIS — M859 Disorder of bone density and structure, unspecified: Secondary | ICD-10-CM | POA: Diagnosis not present

## 2016-12-28 DIAGNOSIS — N390 Urinary tract infection, site not specified: Secondary | ICD-10-CM | POA: Diagnosis not present

## 2017-01-04 DIAGNOSIS — I10 Essential (primary) hypertension: Secondary | ICD-10-CM | POA: Diagnosis not present

## 2017-01-04 DIAGNOSIS — E119 Type 2 diabetes mellitus without complications: Secondary | ICD-10-CM | POA: Diagnosis not present

## 2017-01-04 DIAGNOSIS — E784 Other hyperlipidemia: Secondary | ICD-10-CM | POA: Diagnosis not present

## 2017-01-04 DIAGNOSIS — Z0000 Encounter for general adult medical examination without abnormal findings: Secondary | ICD-10-CM | POA: Diagnosis not present

## 2017-02-06 DIAGNOSIS — Z1212 Encounter for screening for malignant neoplasm of rectum: Secondary | ICD-10-CM | POA: Diagnosis not present

## 2017-02-15 DIAGNOSIS — Z1212 Encounter for screening for malignant neoplasm of rectum: Secondary | ICD-10-CM | POA: Diagnosis not present

## 2017-03-01 ENCOUNTER — Other Ambulatory Visit: Payer: Self-pay | Admitting: Internal Medicine

## 2017-03-01 DIAGNOSIS — Z1231 Encounter for screening mammogram for malignant neoplasm of breast: Secondary | ICD-10-CM

## 2017-03-30 ENCOUNTER — Other Ambulatory Visit: Payer: Self-pay | Admitting: Cardiovascular Disease

## 2017-03-30 NOTE — Telephone Encounter (Signed)
REFILL 

## 2017-04-24 ENCOUNTER — Other Ambulatory Visit: Payer: Self-pay

## 2017-04-24 MED ORDER — TRIAMTERENE-HCTZ 75-50 MG PO TABS: 1.0000 | ORAL_TABLET | Freq: Every day | ORAL | 2 refills | Status: DC

## 2017-05-22 ENCOUNTER — Encounter: Payer: Self-pay | Admitting: Cardiovascular Disease

## 2017-05-22 ENCOUNTER — Ambulatory Visit (INDEPENDENT_AMBULATORY_CARE_PROVIDER_SITE_OTHER): Payer: Medicare Other | Admitting: Cardiovascular Disease

## 2017-05-22 ENCOUNTER — Ambulatory Visit
Admission: RE | Admit: 2017-05-22 | Discharge: 2017-05-22 | Disposition: A | Discharge status: Home / Self Care | Payer: Medicare Other | Source: Ambulatory Visit | Attending: Internal Medicine | Admitting: Internal Medicine

## 2017-05-22 VITALS — BP 134/64 | HR 68 | Ht 66.0 in | Wt 165.0 lb

## 2017-05-22 DIAGNOSIS — I519 Heart disease, unspecified: Secondary | ICD-10-CM

## 2017-05-22 DIAGNOSIS — I951 Orthostatic hypotension: Secondary | ICD-10-CM | POA: Diagnosis not present

## 2017-05-22 DIAGNOSIS — I872 Venous insufficiency (chronic) (peripheral): Secondary | ICD-10-CM | POA: Diagnosis not present

## 2017-05-22 DIAGNOSIS — I10 Essential (primary) hypertension: Secondary | ICD-10-CM | POA: Diagnosis not present

## 2017-05-22 DIAGNOSIS — Z1231 Encounter for screening mammogram for malignant neoplasm of breast: Secondary | ICD-10-CM | POA: Diagnosis not present

## 2017-05-22 DIAGNOSIS — I471 Supraventricular tachycardia: Secondary | ICD-10-CM

## 2017-05-22 DIAGNOSIS — I5189 Other ill-defined heart diseases: Secondary | ICD-10-CM

## 2017-05-22 HISTORY — DX: Personal history of irradiation: Z92.3

## 2017-05-22 MED ORDER — TRIAMTERENE-HCTZ 75-50 MG PO TABS: 0.5000 | ORAL_TABLET | Freq: Every day | ORAL | 3 refills | Status: DC

## 2017-05-22 NOTE — Patient Instructions (Signed)
Dr Sallyanne Kuster has recommended making the following medication changes: 1. DECREASE Triamterene-Hydrochlorothiazide to 0.5 tablet by mouth daily  Your physician has requested that you regularly monitor your blood pressure at home. Please use the same machine to check your blood pressure daily. Keep a record of your blood pressures using the log sheet provided. In 2 weeks, please report your readings back to Dr C. You may use our online patient portal 'MyChart' or you can call the office to speak with a nurse.  Dr Sallyanne Kuster recommends that you schedule a follow-up appointment in 12 months. You will receive a reminder letter in the mail two months in advance. If you don't receive a letter, please call our office to schedule the follow-up appointment.  If you need a refill on your cardiac medications before your next appointment, please call your pharmacy.

## 2017-05-22 NOTE — Progress Notes (Signed)
Cardiology Office Note    Date:  05/22/2017   ID:  [name], DOB 06/30/1932, MRN [id number]  PCP:  Burnard Bunting, MD  Cardiologist:   Sanda Klein, MD   Chief Complaint  Patient presents with  . Follow-up    History of Present Illness:  [name] is a 83 y.o. female with orthostatic hypotension, hyperlipidemia and lower extremity edema due to venous insufficiency, returning for follow-up.   Since her last appointment she has not had any further falls.  At that time we decreased her antihypertensive medications and decided to manage her orthostatic hypotension and leg edema with compression stockings.  Underwent carpal tunnel syndrome in one hand and the other one seems to be spontaneously improving without surgery.  She has a history of DVT and has prominent bilateral varicose veins.  She had bilateral saphenectomy in the past.  Previous event monitor showed occasional PVCs and brief runs of paroxysmal atrial tachycardia at 150 bpm which were asymptomatic. A low dose of beta blocker was prescribed for this, but the medication was discontinued because of the orthostatic hypotension.  She is not currently troubled by palpitations.  In March 2016, her echocardiogram showed normal findings with a session of moderate left atrial enlargement and mild diastolic left ventricular dysfunction.  Past Medical History:  Diagnosis Date  . Arthritis    "in my back"  . Breast cancer ([place]) 1996   left breast cancer   . Complication of anesthesia 04/18/2012   "didn't tolerate it today very well; had the shakes and very hard time w/it"  . Depression 11/20/2012  . DVT (deep venous thrombosis) ([place])   . High cholesterol   . History of alcoholism ([place])    7 1/2 years clean  . Hypertension   . Osteoporosis   . Peripheral vascular disease ([facility])    hx of ligation   . Personal history of radiation therapy 1993   Left Breast Cancer  . Spinal stenosis     Past Surgical History:    Procedure Laterality Date  . BREAST LUMPECTOMY Left 1996  . I&D EXTREMITY  04/17/2012   Procedure: IRRIGATION AND DEBRIDEMENT EXTREMITY;  Surgeon: Rudean Haskell, MD;  Location: [facility];  Service: Orthopedics;  Laterality: Right;  . KNEE ARTHROSCOPY  04/17/2012   Procedure: ARTHROSCOPY KNEE;  Surgeon: Rudean Haskell, MD;  Location: [facility];  Service: Orthopedics;  Laterality: Right;  . KYPHOPLASTY  2009, 2013   thorasic, lumbar  . MENISECTOMY Right 04/11/2012  . POSTERIOR LAMINECTOMY / DECOMPRESSION LUMBAR SPINE  1980's  . rotator cuff surgery  Right   . TONSILLECTOMY AND ADENOIDECTOMY     "I was a child"  . TOTAL KNEE ARTHROPLASTY Right 10/29/2012   Procedure: RIGHT TOTAL KNEE ARTHROPLASTY;  Surgeon: Gearlean Alf, MD;  [facility];  Service: Orthopedics;  Laterality: Right;  . VEIN LIGATION      Current Medications: Outpatient Medications Prior to Visit  Medication Sig Dispense Refill  . alendronate (FOSAMAX) 70 MG tablet Take 70 mg by mouth once a week. Every Sunday.    Marland Kitchen amoxicillin (AMOXIL) 500 MG capsule FOR DENTAL USE ONLY  1  . Artificial Tear Ointment (DRY EYES OP) Apply 1 drop to eye daily as needed (dry eyes).    . Cholecalciferol 1000 UNITS capsule Take 2,000 Units by mouth daily. [facility]S VITAMIN D3 1000 UNIT CAPS    . escitalopram (LEXAPRO) 20 MG tablet TAKE 1 TABLET BY MOUTH ONCE DAILY  5  .  Multiple Vitamins-Minerals (MULTIVITAMIN ADULT) TABS MULTIVITAMINS TABS    . simvastatin (ZOCOR) 20 MG tablet Take 20 mg by mouth daily.    Marland Kitchen triamterene-hydrochlorothiazide (MAXZIDE) 75-50 MG tablet Take 1 tablet by mouth daily. KEEP OV. (Patient taking differently: Take 1 tablet by mouth. Take 1 tab one day and half tab the next day) 90 tablet 2   No facility-administered medications prior to visit.      Allergies:   Oysters [shellfish allergy] and Codeine   Social History   Social History  . Marital status: Married    Spouse name: N/A  . Number of children: 2  . Years of  education: N/A   Occupational History  . retired    Social History Main Topics  . Smoking status: Never Smoker  . Smokeless tobacco: Never Used  . Alcohol use No  . Drug use: No  . Sexual activity: Not Asked   Other Topics Concern  . None   Social History Narrative  . None     Family History:  The patient's family history includes Anuerysm in her father; Kidney failure in her mother.   ROS:   Please see the history of present illness.    ROS All other systems reviewed and are negative.   PHYSICAL EXAM:   VS:  BP 134/64   Pulse 68   Ht 5\' 6"  (1.676 m)   Wt 165 lb (74.8 kg)   BMI 26.63 kg/m     General: Alert, oriented x3, no distress Head: no evidence of trauma, PERRL, EOMI, no exophtalmos or lid lag, no myxedema, no xanthelasma; normal ears, nose and oropharynx Neck: normal jugular venous pulsations and no hepatojugular reflux; brisk carotid pulses without delay and no carotid bruits Chest: clear to auscultation, no signs of consolidation by percussion or palpation, normal fremitus, symmetrical and full respiratory excursions Cardiovascular: normal position and quality of the apical impulse, regular rhythm, normal first and widely split second heart sounds, no murmurs, rubs or gallops.  No edema while wearing compression stockings Abdomen: no tenderness or distention, no masses by palpation, no abnormal pulsatility or arterial bruits, normal bowel sounds, no hepatosplenomegaly Extremities: no clubbing, cyanosis or edema; 2+ radial, ulnar and brachial pulses bilaterally; 2+ right femoral, posterior tibial and dorsalis pedis pulses; 2+ left femoral, posterior tibial and dorsalis pedis pulses; no subclavian or femoral bruits Neurological: grossly nonfocal Psych: Normal mood and affect   Wt Readings from Last 3 Encounters:  05/22/17 165 lb (74.8 kg)  03/23/16 160 lb 6.4 oz (72.8 kg)  12/30/15 155 lb (70.3 kg)      Studies/Labs Reviewed:   EKG:  EKG is ordered today.   The ekg ordered today demonstrates this rhythm with rare PACs right bundle branch block, QTC 467 ms   ASSESSMENT:    1. Orthostatic hypotension   2. Essential hypertension, benign   3. PAT (paroxysmal atrial tachycardia) ([place])   4. Left ventricular diastolic dysfunction with preserved systolic function   5. Peripheral venous insufficiency      PLAN:  In order of problems listed above:  1. Orthostatic hypotension: Appears to have improved with discontinuation of beta-blocker and a reduction in the dose of diuretic.  We will reduce the diuretic further to 37.5/25 mg once daily, have her check her blood pressure and send [place] a log in a couple of weeks 2. HTN: Remains excessively well controlled, I think we will have to allow her systolic blood pressure to run around 150 to avoid falls/syncope. 3.  PAT: Asymptomatic.  4. LV diastolic dysfunction: This was identified by echo but there is no convincing evidence that she has true diastolic heart failure. 5. Peripheral venous insufficiency: Seems to be pleased with the results with the use of compression stockings and so am I. Continue same treatment.  Keep legs elevated when not wearing stockings.  Medication Adjustments/Labs and Tests Ordered: Current medicines are reviewed at length with the patient today.  Concerns regarding medicines are outlined above.  Medication changes, Labs and Tests ordered today are listed in the Patient Instructions below. Patient Instructions  Dr Sallyanne Kuster has recommended making the following medication changes: 1. DECREASE Triamterene-Hydrochlorothiazide to 0.5 tablet by mouth daily  Your physician has requested that you regularly monitor your blood pressure at home. Please use the same machine to check your blood pressure daily. Keep a record of your blood pressures using the log sheet provided. In 2 weeks, please report your readings back to Dr C. You may use our online patient portal 'MyChart' or you can call the  office to speak with a nurse.  Dr Sallyanne Kuster recommends that you schedule a follow-up appointment in 12 months. You will receive a reminder letter in the mail two months in advance. If you don't receive a letter, please call our office to schedule the follow-up appointment.  If you need a refill on your cardiac medications before your next appointment, please call your pharmacy.    Signed, Sanda Klein, MD  05/22/2017 5:53 PM    [address], [place], [place]  [address] Phone: [phone]; Fax: [phone]

## 2017-05-23 ENCOUNTER — Telehealth: Payer: Self-pay | Admitting: Cardiovascular Disease

## 2017-05-23 NOTE — Telephone Encounter (Signed)
Pt called to say will call back with true reading till after her trip.  Will begging Nov 9th.

## 2017-05-23 NOTE — Telephone Encounter (Signed)
Acknowledged.

## 2017-05-23 NOTE — Telephone Encounter (Signed)
FORWARD TO [place] FOR HER INFORMATION PATIENT WAS SEEN IN CLINIC 05/22/17

## 2017-06-19 ENCOUNTER — Telehealth: Payer: Self-pay | Admitting: *Deleted

## 2017-06-19 NOTE — Telephone Encounter (Signed)
Spoke w patient and communicated Dr. Victorino December recommendations, to which she verbalized understanding and thanks.

## 2017-06-19 NOTE — Telephone Encounter (Signed)
Those values are obviously quite variable, but acceptable. Want to keep SBP around 150 (which is what it averages) to avoid orthostatic hypotension and falls.

## 2017-06-19 NOTE — Telephone Encounter (Signed)
Pt dropped off BP log today. Transcribed and forwarded to Dr. Sallyanne Kuster (primary cardiologist) for review.  06-09-17 8:40am 158/73 HR 71 06-10-17 9:00am 139/78 HR 83 06-11-17 10:45am 151/73 HR 77 06-13-17 7:00am 135/66 HR 81 06-14-17 6:15am 127/56 HR 75 06-15-17 4:30pm 161/74 HR 77 06-17-17 9:00am 120/93 HR 75 06-17-17 4:00pm 142/74 HR 80 06-18-17 5:00pm 174/81 HR 72 06-19-17 7:30am 145/72 HR 71

## 2017-08-07 DIAGNOSIS — I872 Venous insufficiency (chronic) (peripheral): Secondary | ICD-10-CM | POA: Diagnosis not present

## 2017-08-07 DIAGNOSIS — I10 Essential (primary) hypertension: Secondary | ICD-10-CM | POA: Diagnosis not present

## 2017-08-07 DIAGNOSIS — E119 Type 2 diabetes mellitus without complications: Secondary | ICD-10-CM | POA: Diagnosis not present

## 2017-08-07 DIAGNOSIS — M859 Disorder of bone density and structure, unspecified: Secondary | ICD-10-CM | POA: Diagnosis not present

## 2017-12-03 ENCOUNTER — Encounter (HOSPITAL_BASED_OUTPATIENT_CLINIC_OR_DEPARTMENT_OTHER): Payer: Self-pay | Admitting: *Deleted

## 2017-12-03 ENCOUNTER — Other Ambulatory Visit: Payer: Self-pay

## 2017-12-03 ENCOUNTER — Emergency Department (HOSPITAL_BASED_OUTPATIENT_CLINIC_OR_DEPARTMENT_OTHER)
Admission: EM | Admit: 2017-12-03 | Discharge: 2017-12-03 | Disposition: A | Discharge status: Home / Self Care | Payer: Medicare Other | Attending: Emergency Medicine | Admitting: Emergency Medicine

## 2017-12-03 ENCOUNTER — Emergency Department (HOSPITAL_BASED_OUTPATIENT_CLINIC_OR_DEPARTMENT_OTHER): Payer: Medicare Other

## 2017-12-03 DIAGNOSIS — Z853 Personal history of malignant neoplasm of breast: Secondary | ICD-10-CM | POA: Diagnosis not present

## 2017-12-03 DIAGNOSIS — I110 Hypertensive heart disease with heart failure: Secondary | ICD-10-CM | POA: Insufficient documentation

## 2017-12-03 DIAGNOSIS — Y999 Unspecified external cause status: Secondary | ICD-10-CM | POA: Insufficient documentation

## 2017-12-03 DIAGNOSIS — I509 Heart failure, unspecified: Secondary | ICD-10-CM | POA: Diagnosis not present

## 2017-12-03 DIAGNOSIS — Y939 Activity, unspecified: Secondary | ICD-10-CM | POA: Diagnosis not present

## 2017-12-03 DIAGNOSIS — W0110XA Fall on same level from slipping, tripping and stumbling with subsequent striking against unspecified object, initial encounter: Secondary | ICD-10-CM | POA: Insufficient documentation

## 2017-12-03 DIAGNOSIS — Z96651 Presence of right artificial knee joint: Secondary | ICD-10-CM | POA: Diagnosis not present

## 2017-12-03 DIAGNOSIS — Z79899 Other long term (current) drug therapy: Secondary | ICD-10-CM | POA: Insufficient documentation

## 2017-12-03 DIAGNOSIS — I10 Essential (primary) hypertension: Secondary | ICD-10-CM | POA: Diagnosis not present

## 2017-12-03 DIAGNOSIS — S0101XA Laceration without foreign body of scalp, initial encounter: Secondary | ICD-10-CM

## 2017-12-03 DIAGNOSIS — R001 Bradycardia, unspecified: Secondary | ICD-10-CM

## 2017-12-03 DIAGNOSIS — W19XXXA Unspecified fall, initial encounter: Secondary | ICD-10-CM

## 2017-12-03 DIAGNOSIS — Y929 Unspecified place or not applicable: Secondary | ICD-10-CM | POA: Diagnosis not present

## 2017-12-03 DIAGNOSIS — S0990XA Unspecified injury of head, initial encounter: Secondary | ICD-10-CM | POA: Diagnosis not present

## 2017-12-03 LAB — BASIC METABOLIC PANEL
Anion gap: 9 (ref 5–15)
BUN: 26 mg/dL — ABNORMAL HIGH (ref 6–20)
CALCIUM: 9.1 mg/dL (ref 8.9–10.3)
CO2: 26 mmol/L (ref 22–32)
CREATININE: 0.73 mg/dL (ref 0.44–1.00)
Chloride: 103 mmol/L (ref 101–111)
GFR calc non Af Amer: >60 mL/min (ref >60)
Glucose, Bld: 94 mg/dL (ref 65–99)
Potassium: 3.6 mmol/L (ref 3.5–5.1)
SODIUM: 138 mmol/L (ref 135–145)

## 2017-12-03 MED ORDER — LIDOCAINE HCL (PF) 1 % IJ SOLN
10.0000 mL | Freq: Once | INTRAMUSCULAR | Status: AC
  Administered 2017-12-03: 10 mL
  Filled 2017-12-03: qty 10

## 2017-12-03 NOTE — ED Notes (Signed)
Alert, NAD, calm, interactive, resps e/u, speaking in clear complete sentences, no dyspnea noted, skin W&D, VSS, here for head injury s/p fall and hit head, L parietal star-burst head lac noted, (denies: LOC, head neck or back pain, NV, other pain, sob, NV, dizziness, visual changes or recent illness). Family at [facility].  EDP into room, prior to RN assessment, see MD notes, orders received and initiated.

## 2017-12-03 NOTE — ED Notes (Signed)
Pt in CT at this time.

## 2017-12-03 NOTE — ED Notes (Signed)
EDP at [facility] to suture

## 2017-12-03 NOTE — ED Notes (Signed)
EKG given to EDP.  

## 2017-12-03 NOTE — ED Notes (Signed)
Pt and SO given d/c instructions as per chart. Verbalizes understanding. No questions.

## 2017-12-03 NOTE — ED Provider Notes (Signed)
[facility] EMERGENCY DEPARTMENT Provider Note   CSN: [id number] Arrival date & time: 12/03/17  2032     History   Chief Complaint Chief Complaint  Patient presents with  . Fall    HPI [name] is a 84 y.o. female.  HPI Patient presents with a fall.  States she was reaching into her new shower and fell, striking the back of her head.  No loss conscious.  Does have large laceration.  No lightheadedness dizziness.  No chest pain.  No confusion.  No bleeding.  She is not on anticoagulation.  Found to be bradycardic upon arrival.  No history of same. But no lightheadedness dizziness chest pain. Past Medical History:  Diagnosis Date  . Arthritis    "in my back"  . Breast cancer ([place]) 1996   left breast cancer   . Complication of anesthesia 04/18/2012   "didn't tolerate it today very well; had the shakes and very hard time w/it"  . Depression 11/20/2012  . DVT (deep venous thrombosis) ([place])   . High cholesterol   . History of alcoholism ([place])    7 1/2 years clean  . Hypertension   . Osteoporosis   . Peripheral vascular disease ([facility])    hx of ligation   . Personal history of radiation therapy 1993   Left Breast Cancer  . Spinal stenosis     Patient Active Problem List   Diagnosis Date Noted  . Left ventricular diastolic dysfunction with preserved systolic function 31/54/0086  . PAT (paroxysmal atrial tachycardia) ([place]) 03/24/2016  . Diastolic dysfunction without heart failure 03/24/2016  . Peripheral venous insufficiency 03/24/2016  . Closed wedge compression fracture of fourth lumbar vertebra ([place])   . Near syncope 10/20/2014  . Orthostatic hypotension 10/20/2014  . Fatigue 10/19/2014  . Syncope 10/19/2014  . Generalized weakness   . Elevated troponin   . Dyspnea on exertion   . Rib fracture 09/05/2014  . HX: breast cancer 09/26/2013  . Depression 11/20/2012  . Unspecified constipation 11/20/2012  . Other and unspecified hyperlipidemia 11/20/2012    . Essential hypertension, benign 11/20/2012  . Postoperative anemia due to acute blood loss 11/01/2012  . Hyponatremia 11/01/2012  . Hypokalemia 11/01/2012  . OA (osteoarthritis) of knee 10/29/2012  . Septic joint of right knee joint ([place]) 05/23/2012  . Elevated liver enzymes 05/23/2012    Past Surgical History:  Procedure Laterality Date  . BREAST LUMPECTOMY Left 1996  . I&D EXTREMITY  04/17/2012   Procedure: IRRIGATION AND DEBRIDEMENT EXTREMITY;  Surgeon: Rudean Haskell, MD;  Location: [facility];  Service: Orthopedics;  Laterality: Right;  . KNEE ARTHROSCOPY  04/17/2012   Procedure: ARTHROSCOPY KNEE;  Surgeon: Rudean Haskell, MD;  Location: [facility];  Service: Orthopedics;  Laterality: Right;  . KYPHOPLASTY  2009, 2013   thorasic, lumbar  . MENISECTOMY Right 04/11/2012  . POSTERIOR LAMINECTOMY / DECOMPRESSION LUMBAR SPINE  1980's  . rotator cuff surgery  Right   . TONSILLECTOMY AND ADENOIDECTOMY     "I was a child"  . TOTAL KNEE ARTHROPLASTY Right 10/29/2012   Procedure: RIGHT TOTAL KNEE ARTHROPLASTY;  Surgeon: Gearlean Alf, MD;  Location: [facility];  Service: Orthopedics;  Laterality: Right;  . VEIN LIGATION       OB History   None      Home Medications    Prior to Admission medications   Medication Sig Start Date End Date Taking? Authorizing Provider  alendronate (FOSAMAX) 70 MG tablet Take 70  mg by mouth once a week. Every Sunday. 08/21/13   [provider]  amoxicillin (AMOXIL) 500 MG capsule FOR DENTAL USE ONLY 11/16/14   [provider]  Artificial Tear Ointment (DRY EYES OP) Apply 1 drop to eye daily as needed (dry eyes).    [provider]  Cholecalciferol 1000 UNITS capsule Take 2,000 Units by mouth daily. [employer] VITAMIN D3 1000 UNIT CAPS 10/19/11   [provider]  escitalopram (LEXAPRO) 20 MG tablet TAKE 1 TABLET BY MOUTH ONCE DAILY 08/04/15   [provider]  Multiple Vitamins-Minerals (MULTIVITAMIN ADULT) TABS MULTIVITAMINS TABS  08/17/09   [provider]  simvastatin (ZOCOR) 20 MG tablet Take 20 mg by mouth daily. 03/30/10   [provider]  triamterene-hydrochlorothiazide (MAXZIDE) 75-50 MG tablet Take 0.5 tablets by mouth daily. 05/22/17   Croitoru, Dani Gobble, MD    Family History Family History  Problem Relation Age of Onset  . Kidney failure Mother   . Anuerysm Father        AAA    Social History Social History   Tobacco Use  . Smoking status: Never Smoker  . Smokeless tobacco: Never Used  Substance Use Topics  . Alcohol use: No  . Drug use: No     Allergies   Oysters [shellfish allergy] and Codeine   Review of Systems Review of Systems  Constitutional: Negative for appetite change.  HENT: Negative for drooling.   Respiratory: Negative for shortness of breath.   Gastrointestinal: Negative for abdominal pain.  Genitourinary: Negative for flank pain.  Musculoskeletal: Negative for arthralgias and back pain.  Skin: Positive for wound.  Neurological: Negative for weakness.  Psychiatric/Behavioral: Negative for confusion.     Physical Exam Updated Vital Signs BP 133/70   Pulse (!) 35   Temp 97.6 F (36.4 C) (Oral)   Resp 15   Ht 5' 6.5" (1.689 m)   Wt 70.3 kg (155 lb)   SpO2 (!) 89%   BMI 24.64 kg/m   Physical Exam  Constitutional: She appears well-developed.  HENT:  Large occipital stellate laceration irregularly-shaped.  Total laceration likely near 10 cm.  Eyes: Pupils are equal, round, and reactive to light.  Neck: Neck supple.  Cardiovascular: Normal rate.  Pulmonary/Chest: Effort normal. She has no wheezes. She has no rales.  Abdominal: There is no tenderness.  Musculoskeletal: She exhibits no edema.  Neurological: She is alert.  Skin: Skin is warm. Capillary refill takes less than 2 seconds.     ED Treatments / Results  Labs (all labs ordered are listed, but only abnormal results are displayed) Labs Reviewed  BASIC METABOLIC PANEL - Abnormal;  Notable for the following components:      Result Value   BUN 26 (*)    All other components within normal limits    EKG EKG Interpretation  Date/Time:  Sunday Dec 03 2017 20:47:00 EDT Ventricular Rate:  53 PR Interval:    QRS Duration: 163 QT Interval:  479 QTC Calculation: 450 R Axis:   39 Text Interpretation:  Sinus rhythm with PAC with bradycardia Right bundle branch block Confirmed by Davonna Belling [phone]) on 12/03/2017 9:06:22 PM   Radiology Ct Head Wo Contrast  Result Date: 12/03/2017 CLINICAL DATA:  84 year old female status post fall in the shower today. EXAM: CT HEAD WITHOUT CONTRAST TECHNIQUE: Contiguous axial images were obtained from the base of the skull through the vertex without intravenous contrast. COMPARISON:  Head CT 08/16/2016 and earlier. FINDINGS: Brain: Stable cerebral volume. No  midline shift, ventriculomegaly, mass effect, evidence of mass lesion, intracranial hemorrhage or evidence of cortically based acute infarction. Stable gray-white matter differentiation throughout the brain. Patchy and confluent bilateral cerebral white matter hypodensity. Vascular: Calcified atherosclerosis at the skull base. No suspicious intracranial vascular hyperdensity. Skull: Stable.  No acute osseous abnormality identified. Sinuses/Orbits: Visualized paranasal sinuses and mastoids are stable and well pneumatized. Other: No acute orbit or scalp soft tissue findings; chronic appearing left posterior convexity scalp soft tissue thickening is stable. IMPRESSION: Stable. No acute intracranial abnormality. No acute traumatic injury identified. Electronically Signed   By: Genevie Ann M.D.   On: 12/03/2017 22:07    Procedures .Marland KitchenLaceration Repair Date/Time: 12/03/2017 11:15 PM Performed by: Davonna Belling, MD Authorized by: Davonna Belling, MD   Consent:    Consent obtained:  Verbal   Consent given by:  Patient   Risks discussed:  Infection, retained foreign body, pain, poor  cosmetic result, need for additional repair and poor wound healing   Alternatives discussed:  No treatment and delayed treatment Anesthesia (see MAR for exact dosages):    Anesthesia method:  Local infiltration   Local anesthetic:  Lidocaine 1% w/o epi Laceration details:    Location:  Scalp   Scalp location:  Occipital   Length (cm):  10 Repair type:    Repair type:  Complex Pre-procedure details:    Preparation:  Patient was prepped and draped in usual sterile fashion Exploration:    Limited defect created (wound extended): yes     Hemostasis achieved with:  Direct pressure   Wound exploration: entire depth of wound probed and visualized     Wound extent: fascia violated     Contaminated: no   Treatment:    Wound cleansed with: wound cleaner.   Amount of cleaning:  Extensive   Debridement:  Minimal   Undermining:  Minimal   Scar revision: no   Fascia repair:    Suture size:  4-0   Suture material:  Vicryl   Suture technique:  Simple interrupted   Number of sutures:  3 Skin repair:    Repair method:  Sutures   Suture size:  4-0   Wound skin closure material used: ethilon.   Suture technique:  Simple interrupted   Number of sutures:  13 Approximation:    Approximation:  Close Post-procedure details:    Dressing:  Sterile dressing   Patient tolerance of procedure:  Tolerated well, no immediate complications   (including critical care time)  Medications Ordered in ED Medications  lidocaine (PF) (XYLOCAINE) 1 % injection 10 mL (10 mLs Infiltration Given by Other 12/03/17 2154)     Initial Impression / Assessment and Plan / ED Course  I have reviewed the triage vital signs and the nursing notes.  Pertinent labs & imaging results that were available during my care of the patient were reviewed by me and considered in my medical decision making (see chart for details).     Patient with fall.  Laceration closed.  However does have a mild bradycardia.  Appears to be  mostly in the 74s.  Occasionally will go slightly slower but not consistent.  Not on any rate controlling medications.  Asymptomatic.  Will have follow-up with her cardiologist.  Sats shown of 89% and vitals but has not had a good waveform when it looks lower.  Likely artifactual.  Wound sutures to be removed in around 10 to 14 days.  Final Clinical Impressions(s) / ED Diagnoses   Final diagnoses:  Fall, initial encounter  Scalp laceration, initial encounter  Bradycardia    ED Discharge Orders    None       Davonna Belling, MD 12/03/17 2317

## 2017-12-03 NOTE — Discharge Instructions (Addendum)
Sutures out in 10-14 days.

## 2017-12-03 NOTE — ED Triage Notes (Addendum)
Pt states she had had a great day and was taking a shower in their new shower when she fell, hitting the door. Not sure how or why that happened. Denies LOC PERRL. Lac to back of head. Bleeding controlled. When getting VS, noticed pt had slow, irregular HR. No hx of same. EKG being done. Pt placed on monitor. Neuro WNL. Went to graduation in [facility] today and noticed she had trouble with [facility] while walking uphill, but did ok coming down. No SHOB at present. "I go to the gym."

## 2017-12-04 NOTE — Progress Notes (Signed)
Cardiology Office Note   Date:  12/05/2017   ID:  [name], DOB Jul 20, 1932, MRN [id number]  PCP:  Burnard Bunting, MD  Cardiologist:  Dr. Sallyanne Kuster   Chief Complaint  Patient presents with  . Follow-up    was told she had bradycardia, pt denies chest pains, slight swelling in ankles, slight SOB when doing activites     History of Present Illness: [name] is a 84 y.o. female who presents for ongoing assessment and management of orthostatic hypotension, DVT, PSVT, prominent bilateral varicose veins, chronic LEE, hyperlipidemia. She is here for post ER follow up after being seen after falling in the shower, resulting in a large occipital laceration. She was bradycardiac on arrival.   She states that she has some positional dizziness especially when bending over. She has significant spinal stenosis with kyphosis. She remains active but now uses a cane for ambulation She goes to the gym and works with a trainer a couple of times a week and walks around a golf [place] near her home. She has not done this since injury   On last office visit, Dr. Sallyanne Kuster decreased triamterene/HCTZ dose in half to allow for permissive hypertension.   Past Medical History:  Diagnosis Date  . Arthritis    "in my back"  . Breast cancer ([place]) 1996   left breast cancer   . Complication of anesthesia 04/18/2012   "didn't tolerate it today very well; had the shakes and very hard time w/it"  . Depression 11/20/2012  . DVT (deep venous thrombosis) ([place])   . High cholesterol   . History of alcoholism ([place])    7 1/2 years clean  . Hypertension   . Osteoporosis   . Peripheral vascular disease ([facility])    hx of ligation   . Personal history of radiation therapy 1993   Left Breast Cancer  . Spinal stenosis     Past Surgical History:  Procedure Laterality Date  . BREAST LUMPECTOMY Left 1996  . I&D EXTREMITY  04/17/2012   Procedure: IRRIGATION AND DEBRIDEMENT EXTREMITY;  Surgeon: Rudean Haskell, MD;   Location: [facility];  Service: Orthopedics;  Laterality: Right;  . KNEE ARTHROSCOPY  04/17/2012   Procedure: ARTHROSCOPY KNEE;  Surgeon: Rudean Haskell, MD;  Location: [facility];  Service: Orthopedics;  Laterality: Right;  . KYPHOPLASTY  2009, 2013   thorasic, lumbar  . MENISECTOMY Right 04/11/2012  . POSTERIOR LAMINECTOMY / DECOMPRESSION LUMBAR SPINE  1980's  . rotator cuff surgery  Right   . TONSILLECTOMY AND ADENOIDECTOMY     "I was a child"  . TOTAL KNEE ARTHROPLASTY Right 10/29/2012   Procedure: RIGHT TOTAL KNEE ARTHROPLASTY;  Surgeon: Gearlean Alf, MD;  [facility];  Service: Orthopedics;  Laterality: Right;  . VEIN LIGATION       Current Outpatient Medications  Medication Sig Dispense Refill  . alendronate (FOSAMAX) 70 MG tablet Take 70 mg by mouth once a week. Every Sunday.    Marland Kitchen amoxicillin (AMOXIL) 500 MG capsule FOR DENTAL USE ONLY  1  . Artificial Tear Ointment (DRY EYES OP) Apply 1 drop to eye daily as needed (dry eyes).    . Cholecalciferol 1000 UNITS capsule Take 2,000 Units by mouth daily. [facility]S VITAMIN D3 1000 UNIT CAPS    . escitalopram (LEXAPRO) 20 MG tablet TAKE 1 TABLET BY MOUTH ONCE DAILY  5  . Multiple Vitamins-Minerals (MULTIVITAMIN ADULT) TABS MULTIVITAMINS TABS    . simvastatin (ZOCOR) 20 MG tablet Take 20  mg by mouth daily.    Marland Kitchen triamterene-hydrochlorothiazide (MAXZIDE) 75-50 MG tablet Take 0.5 tablets by mouth daily. 45 tablet 3   No current facility-administered medications for this visit.     Allergies:   Oysters [shellfish allergy] and Codeine    Social History:  The patient  reports that she has never smoked. She has never used smokeless tobacco. She reports that she does not drink alcohol or use drugs.   Family History:  The patient's family history includes Anuerysm in her father; Kidney failure in her mother.    ROS: All other systems are reviewed and negative. Unless otherwise mentioned in H&P    PHYSICAL EXAM: VS:  BP 132/68 (BP Location: Left  Arm)   Pulse (!) 54   Ht 5' 6.5" (1.689 m)   Wt 164 lb 9.6 oz (74.7 kg)   BMI 26.17 kg/m  , BMI Body mass index is 26.17 kg/m. GEN: Well nourished, well developed, in no acute distress  HEENT: normal  Neck: no JVD, carotid bruits, or masses Cardiac: RRR; tachycardia no murmurs, rubs, or gallops,no edema  Respiratory:  Clear to auscultation bilaterally, normal work of breathing GI: soft, nontender, nondistended, + BS MS: no deformity or atrophy Laceration to the occipital scalp no bleeding or infection.  Skin: warm and dry, no rash Neuro:  Strength and sensation are intact Psych: euthymic mood, full affect   EKG: Not completed this office visit.   Recent Labs: 12/03/2017: BUN 26; Creatinine, Ser 0.73; Potassium 3.6; Sodium 138    Lipid Panel No results found for: CHOL, TRIG, HDL, CHOLHDL, VLDL, LDLCALC, LDLDIRECT    Wt Readings from Last 3 Encounters:  12/05/17 164 lb 9.6 oz (74.7 kg)  12/03/17 155 lb (70.3 kg)  05/22/17 165 lb (74.8 kg)      Other studies Reviewed: Echocardiogram October 20, 2017  Left ventricle: The cavity size was normal. Wall thickness was normal. Systolic function was normal. The estimated ejection fraction was in the range of 55% to 60%. Wall motion was normal; there were no regional wall motion abnormalities. Doppler parameters are consistent with abnormal left ventricular relaxation (grade 1 diastolic dysfunction). - Aortic valve: There was mild regurgitation. - Mitral valve: Calcified annulus. - Left atrium: The atrium was moderately dilated. - Pulmonary arteries: Systolic pressure was mildly increased. - Pericardium, extracardiac: A trivial pericardial effusion was identified.  Impressions:  - Normal LV function; grade 1 diastolic dysfunction; moderate LAE; mild AI; mildly elevated pulmonary pressure; echodensity in right atrium of uncertain etiology (? prominent eustachian valve); suggest TEE to further  assess.   ASSESSMENT AND PLAN:  1. Bradycardia: Will place cardiac monitor to evaluate for significant bradycardia. She has had some symptomatic dizziness and fell while bending over worrisome for vagal episode. She is agreeable to have monitor placed but will be traveling and wants to wait until she returns home in a few weeks before having this placed.,   2. Hypertension; Will need to consider discontinuing HCTZ portion of triamterene and consider low dose ACE or CCB. Orthostatics were negative on this office visit but HR decreased from 49 bpm to 37 bpm. She was slightly dizzy. She is to watch this carefully.   3. History of DVT: No longer on anticoagulation.   4. Hypercholesterolemia:  Continue statin therapy.     Current medicines are reviewed at length with the patient today.    Labs/ tests ordered today include: Cardiac monitor   Phill Myron. West Pugh, ANP, AACC   12/05/2017 12:15 PM  [facility] [address] [place], [place] [address] Phone: [phone]; Fax: [phone]

## 2017-12-05 ENCOUNTER — Encounter: Payer: Self-pay | Admitting: Adult Health

## 2017-12-05 ENCOUNTER — Ambulatory Visit: Payer: Medicare Other | Admitting: Adult Health

## 2017-12-05 VITALS — BP 132/68 | HR 54 | Ht 66.5 in | Wt 164.6 lb

## 2017-12-05 DIAGNOSIS — R55 Syncope and collapse: Secondary | ICD-10-CM

## 2017-12-05 DIAGNOSIS — R001 Bradycardia, unspecified: Secondary | ICD-10-CM

## 2017-12-05 DIAGNOSIS — I10 Essential (primary) hypertension: Secondary | ICD-10-CM | POA: Diagnosis not present

## 2017-12-05 NOTE — Patient Instructions (Signed)
Schedule 30 day event monitor    Your physician recommends that you schedule a follow-up appointment with Dr.Croitoru after monitor.

## 2017-12-05 NOTE — Progress Notes (Signed)
Thanks, will follow up on monitor MCr

## 2017-12-13 DIAGNOSIS — Z4802 Encounter for removal of sutures: Secondary | ICD-10-CM | POA: Diagnosis not present

## 2017-12-13 DIAGNOSIS — R55 Syncope and collapse: Secondary | ICD-10-CM | POA: Diagnosis not present

## 2017-12-13 DIAGNOSIS — S0191XA Laceration without foreign body of unspecified part of head, initial encounter: Secondary | ICD-10-CM | POA: Diagnosis not present

## 2017-12-13 DIAGNOSIS — R001 Bradycardia, unspecified: Secondary | ICD-10-CM | POA: Diagnosis not present

## 2018-01-29 ENCOUNTER — Ambulatory Visit (INDEPENDENT_AMBULATORY_CARE_PROVIDER_SITE_OTHER): Payer: Medicare Other

## 2018-01-29 DIAGNOSIS — R55 Syncope and collapse: Secondary | ICD-10-CM | POA: Diagnosis not present

## 2018-01-29 DIAGNOSIS — R001 Bradycardia, unspecified: Secondary | ICD-10-CM | POA: Diagnosis not present

## 2018-01-31 DIAGNOSIS — H6123 Impacted cerumen, bilateral: Secondary | ICD-10-CM | POA: Diagnosis not present

## 2018-02-02 ENCOUNTER — Other Ambulatory Visit: Payer: Self-pay

## 2018-02-02 DIAGNOSIS — D0471 Carcinoma in situ of skin of right lower limb, including hip: Secondary | ICD-10-CM | POA: Diagnosis not present

## 2018-02-02 DIAGNOSIS — C44629 Squamous cell carcinoma of skin of left upper limb, including shoulder: Secondary | ICD-10-CM | POA: Diagnosis not present

## 2018-02-07 ENCOUNTER — Telehealth: Payer: Self-pay
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
## 2018-02-12 DIAGNOSIS — S8002XA Contusion of left knee, initial encounter: Secondary | ICD-10-CM | POA: Diagnosis not present

## 2018-02-12 DIAGNOSIS — M25562 Pain in left knee: Secondary | ICD-10-CM | POA: Diagnosis not present

## 2018-02-13 ENCOUNTER — Telehealth: Payer: Self-pay | Admitting: Cardiovascular Disease

## 2018-02-13 DIAGNOSIS — C44629 Squamous cell carcinoma of skin of left upper limb, including shoulder: Secondary | ICD-10-CM | POA: Diagnosis not present

## 2018-02-13 NOTE — Telephone Encounter (Signed)
Left message for pt to call back re: her phone call to our office

## 2018-02-13 NOTE — Telephone Encounter (Signed)
New Message:      Pt states she was told to call and let us know that she just had some skin cancer removed so some of the readings may be out of whack she states.

## 2018-02-13 NOTE — Telephone Encounter (Signed)
Patient returned phone call, was advised I would notify Dr.Croitoru and his nurse that the monitor results may be funny bc she had skin cancer removed. No other questions or concerns.   Thanks!

## 2018-02-19 DIAGNOSIS — M7042 Prepatellar bursitis, left knee: Secondary | ICD-10-CM | POA: Diagnosis not present

## 2018-02-19 DIAGNOSIS — S8002XD Contusion of left knee, subsequent encounter: Secondary | ICD-10-CM | POA: Diagnosis not present

## 2018-02-27 DIAGNOSIS — S8002XD Contusion of left knee, subsequent encounter: Secondary | ICD-10-CM | POA: Diagnosis not present

## 2018-02-27 DIAGNOSIS — M25562 Pain in left knee: Secondary | ICD-10-CM | POA: Diagnosis not present

## 2018-03-13 ENCOUNTER — Ambulatory Visit: Payer: Medicare Other | Admitting: Cardiovascular Disease

## 2018-03-15 DIAGNOSIS — M25562 Pain in left knee: Secondary | ICD-10-CM | POA: Diagnosis not present

## 2018-03-15 DIAGNOSIS — S8002XA Contusion of left knee, initial encounter: Secondary | ICD-10-CM | POA: Diagnosis not present

## 2018-03-27 DIAGNOSIS — E119 Type 2 diabetes mellitus without complications: Secondary | ICD-10-CM | POA: Diagnosis not present

## 2018-03-27 DIAGNOSIS — E7849 Other hyperlipidemia: Secondary | ICD-10-CM | POA: Diagnosis not present

## 2018-03-27 DIAGNOSIS — M859 Disorder of bone density and structure, unspecified: Secondary | ICD-10-CM | POA: Diagnosis not present

## 2018-03-27 DIAGNOSIS — R82998 Other abnormal findings in urine: Secondary | ICD-10-CM | POA: Diagnosis not present

## 2018-03-27 DIAGNOSIS — I10 Essential (primary) hypertension: Secondary | ICD-10-CM | POA: Diagnosis not present

## 2018-03-29 DIAGNOSIS — I10 Essential (primary) hypertension: Secondary | ICD-10-CM | POA: Diagnosis not present

## 2018-03-29 DIAGNOSIS — Z23 Encounter for immunization: Secondary | ICD-10-CM | POA: Diagnosis not present

## 2018-03-29 DIAGNOSIS — E7849 Other hyperlipidemia: Secondary | ICD-10-CM | POA: Diagnosis not present

## 2018-03-29 DIAGNOSIS — E1169 Type 2 diabetes mellitus with other specified complication: Secondary | ICD-10-CM | POA: Diagnosis not present

## 2018-03-29 DIAGNOSIS — Z0000 Encounter for general adult medical examination without abnormal findings: Secondary | ICD-10-CM | POA: Diagnosis not present

## 2018-04-06 ENCOUNTER — Ambulatory Visit: Payer: Medicare Other | Admitting: Cardiovascular Disease

## 2018-04-06 ENCOUNTER — Encounter: Payer: Self-pay | Admitting: Cardiovascular Disease

## 2018-04-06 VITALS — BP 108/64 | HR 61 | Ht 66.5 in | Wt 161.8 lb

## 2018-04-06 DIAGNOSIS — I519 Heart disease, unspecified: Secondary | ICD-10-CM

## 2018-04-06 DIAGNOSIS — I5189 Other ill-defined heart diseases: Secondary | ICD-10-CM

## 2018-04-06 DIAGNOSIS — I472 Ventricular tachycardia: Secondary | ICD-10-CM | POA: Diagnosis not present

## 2018-04-06 DIAGNOSIS — R5383 Other fatigue: Secondary | ICD-10-CM | POA: Diagnosis not present

## 2018-04-06 DIAGNOSIS — Z1212 Encounter for screening for malignant neoplasm of rectum: Secondary | ICD-10-CM | POA: Diagnosis not present

## 2018-04-06 DIAGNOSIS — E7800 Pure hypercholesterolemia, unspecified: Secondary | ICD-10-CM

## 2018-04-06 DIAGNOSIS — I10 Essential (primary) hypertension: Secondary | ICD-10-CM

## 2018-04-06 DIAGNOSIS — I951 Orthostatic hypotension: Secondary | ICD-10-CM | POA: Diagnosis not present

## 2018-04-06 DIAGNOSIS — I872 Venous insufficiency (chronic) (peripheral): Secondary | ICD-10-CM

## 2018-04-06 DIAGNOSIS — I4729 Other ventricular tachycardia: Secondary | ICD-10-CM

## 2018-04-06 NOTE — Progress Notes (Signed)
Cardiology Office Note    Date:  04/06/2018   ID:  [name], DOB [birth year], MRN [id number]  PCP:  Burnard Bunting, MD  Cardiologist:   Sanda Klein, MD   Chief Complaint  Patient presents with  . Fatigue    History of Present Illness:  [name] is a 84 y.o. female with orthostatic hypotension, hyperlipidemia and lower extremity edema due to venous insufficiency, returning for follow-up.   After this her dose of diuretic, she has not had problems of orthostatic dizziness and has not had any falls.  She is wearing compression stockings and is generally take care of her edema.  She has noticed that increased salt intake does make the swelling worse.  It happens if she eats out for example.  Her complaints today center around fatigue.  She finds that she has to rest more frequently.  She is not accustomed to this since, she is "never get tired".  She does exercise with a trainer at [employer] and is planning to increase her activity to twice weekly from once weekly.  Recent labs show a normal hemoglobin of 12.8 and a normal TSH.  She denies exertional dyspnea but does not have chest discomfort at rest or with activity.  She does not have syncope or presyncope.  She is unaware of palpitations.  She has a history of DVT and has prominent bilateral varicose veins.  She had bilateral saphenectomy in the past.  On the event monitor performed a few months ago, she did not have any evidence of serious arrhythmia.  No major bradycardic events and a single episode of nonsustained VT that occurred when she almost had a motor vehicle accident a maximum heart rate achieved was 106 bpm which is 78% of the maximum predicted for her age  In March 2016, her echocardiogram showed normal findings with a session of moderate left atrial enlargement and mild diastolic left ventricular dysfunction.  Past Medical History:  Diagnosis Date  . Arthritis    "in my back"  . Breast cancer ([place])  1996   left breast cancer   . Complication of anesthesia 04/18/2012   "didn't tolerate it today very well; had the shakes and very hard time w/it"  . Depression 11/20/2012  . DVT (deep venous thrombosis) ([place])   . High cholesterol   . History of alcoholism ([place])    7 1/2 years clean  . Hypertension   . Osteoporosis   . Peripheral vascular disease (HCC)    hx of ligation   . Personal history of radiation therapy 1993   Left Breast Cancer  . Spinal stenosis     Past Surgical History:  Procedure Laterality Date  . BREAST LUMPECTOMY Left 1996  . I&D EXTREMITY  04/17/2012   Procedure: IRRIGATION AND DEBRIDEMENT EXTREMITY;  Surgeon: Rudean Haskell, MD;  Location: [facility];  Service: Orthopedics;  Laterality: Right;  . KNEE ARTHROSCOPY  04/17/2012   Procedure: ARTHROSCOPY KNEE;  Surgeon: Rudean Haskell, MD;  Location: [facility];  Service: Orthopedics;  Laterality: Right;  . KYPHOPLASTY  2009, 2013   thorasic, lumbar  . MENISECTOMY Right 04/11/2012  . POSTERIOR LAMINECTOMY / DECOMPRESSION LUMBAR SPINE  1980's  . rotator cuff surgery  Right   . TONSILLECTOMY AND ADENOIDECTOMY     "I was a child"  . TOTAL KNEE ARTHROPLASTY Right 10/29/2012   Procedure: RIGHT TOTAL KNEE ARTHROPLASTY;  Surgeon: Gearlean Alf, MD;  Location: [facility];  Service: Orthopedics;  Laterality:  Right;  Marland Kitchen VEIN LIGATION      Current Medications: Outpatient Medications Prior to Visit  Medication Sig Dispense Refill  . alendronate (FOSAMAX) 70 MG tablet Take 70 mg by mouth once a week. Every Sunday.    Marland Kitchen amoxicillin (AMOXIL) 500 MG capsule FOR DENTAL USE ONLY  1  . Artificial Tear Ointment (DRY EYES OP) Apply 1 drop to eye daily as needed (dry eyes).    . Cholecalciferol 1000 UNITS capsule Take 2,000 Units by mouth daily. [employer] VITAMIN D3 1000 UNIT CAPS    . escitalopram (LEXAPRO) 20 MG tablet TAKE 1 TABLET BY MOUTH ONCE DAILY  5  . Multiple Vitamins-Minerals (MULTIVITAMIN ADULT) TABS MULTIVITAMINS TABS    . simvastatin  (ZOCOR) 20 MG tablet Take 20 mg by mouth daily.    Marland Kitchen triamterene-hydrochlorothiazide (MAXZIDE) 75-50 MG tablet Take 0.5 tablets by mouth daily. 45 tablet 3   No facility-administered medications prior to visit.      Allergies:   Oysters [shellfish allergy] and Codeine   Social History   Socioeconomic History  . Marital status: Married    Spouse name: Not on file  . Number of children: 2  . Years of education: Not on file  . Highest education level: Not on file  Occupational History  . Occupation: retired  Scientific laboratory technician  . Financial resource strain: Not on file  . Food insecurity:    Worry: Not on file    Inability: Not on file  . Transportation needs:    Medical: Not on file    Non-medical: Not on file  Tobacco Use  . Smoking status: Never Smoker  . Smokeless tobacco: Never Used  Substance and Sexual Activity  . Alcohol use: No  . Drug use: No  . Sexual activity: Not on file  Lifestyle  . Physical activity:    Days per week: Not on file    Minutes per session: Not on file  . Stress: Not on file  Relationships  . Social connections:    Talks on phone: Not on file    Gets together: Not on file    Attends religious service: Not on file    Active member of club or organization: Not on file    Attends meetings of clubs or organizations: Not on file    Relationship status: Not on file  Other Topics Concern  . Not on file  Social History Narrative  . Not on file     Family History:  The patient's family history includes Anuerysm in her father; Kidney failure in her mother.   ROS:   Please see the history of present illness.    ROS All other systems reviewed and are negative.   PHYSICAL EXAM:   VS:  BP 108/64 (BP Location: Left Arm, Patient Position: Sitting, Cuff Size: Normal)   Pulse 61   Ht 5' 6.5" (1.689 m)   Wt 161 lb 12.8 oz (73.4 kg)   BMI 25.72 kg/m     General: Alert, oriented x3, no distress, appears well Head: no evidence of trauma, PERRL, EOMI,  no exophtalmos or lid lag, no myxedema, no xanthelasma; normal ears, nose and oropharynx Neck: normal jugular venous pulsations and no hepatojugular reflux; brisk carotid pulses without delay and no carotid bruits Chest: clear to auscultation, no signs of consolidation by percussion or palpation, normal fremitus, symmetrical and full respiratory excursions Cardiovascular: normal position and quality of the apical impulse, regular rhythm with rare ectopy, normal first and widely split second  heart sounds, no murmurs, rubs or gallops Abdomen: no tenderness or distention, no masses by palpation, no abnormal pulsatility or arterial bruits, normal bowel sounds, no hepatosplenomegaly Extremities: no clubbing, cyanosis or edema; 2+ radial, ulnar and brachial pulses bilaterally; 2+ right femoral, posterior tibial and dorsalis pedis pulses; 2+ left femoral, posterior tibial and dorsalis pedis pulses; no subclavian or femoral bruits Neurological: grossly nonfocal Psych: Normal mood and affect   Wt Readings from Last 3 Encounters:  04/06/18 161 lb 12.8 oz (73.4 kg)  12/05/17 164 lb 9.6 oz (74.7 kg)  12/03/17 155 lb (70.3 kg)      Studies/Labs Reviewed:   EKG:  EKG is ordered today.  It shows sinus rhythm with PACs, right bundle branch block, no ischemic repolarization normalities, QTC 448 ms  LABS: Hemoglobin A1c 5.7%, hemoglobin 12.8, TSH 1.46, normal liver function tests, Total cholesterol 182, HDL 60, triglycerides 98, LDL 94  ASSESSMENT:    1. Fatigue, unspecified type   2. Orthostatic hypotension   3. Essential hypertension   4. NSVT (nonsustained ventricular tachycardia) (HCC)   5. Echocardiogram shows left ventricular diastolic dysfunction   6. Peripheral venous insufficiency   7. Hypercholesterolemia     PLAN:  In order of problems listed above:  1. Fatigue: She is not anemic and does not hypothyroidism.  I do not think she is describing chronotropic incompetence, especially since  we have evidence of reasonably normal circadian rhythm variation on her event monitor.  Have asked her to check her heart rate during physical activity with her trainer.  If she can achieve heart rates in the 110-115 range she would not benefit from a pacemaker.   2. Orthostatic hypotension: Markedly improved actually cut back her dose of diuretic.  Her blood pressure seems to be fairly low and we might be able to decrease her dose further. 3. HTN: She brought a detailed list of her blood pressure recorded daily over the last several weeks.  The range is 105-155/60-70 most SBP readings are 120-130.  We will continue current medications for the time being. 4. PVC/NSVT: Asymptomatic.  His single 6 beat episode of nonsustained ventricular tachycardia occurred during intense emotional stress.  Specific therapy is not indicated. 5. LV diastolic dysfunction: This was identified by echo but there is no convincing evidence that she has true diastolic heart failure.  She has fatigue but does not have dyspnea.  Her edema is likely related to venous insufficiency 6. Peripheral venous insufficiency: Compression stockings and leg elevation seems to be working. 7. HLP: LDL cholesterol level and HDL cholesterol in the desirable range on statin therapy.  Medication Adjustments/Labs and Tests Ordered: Current medicines are reviewed at length with the patient today.  Concerns regarding medicines are outlined above.  Medication changes, Labs and Tests ordered today are listed in the Patient Instructions below. Patient Instructions  Dr Sallyanne Kuster recommends that you schedule a follow-up appointment in 12 months. You will receive a reminder letter in the mail two months in advance. If you don't receive a letter, please call our office to schedule the follow-up appointment.  If you need a refill on your cardiac medications before your next appointment, please call your pharmacy.   Dr C would like to know what your heart  rate is during maximum exercise. Please report 3-4 readings back to [place].    Signed, Sanda Klein, MD  04/06/2018 6:23 PM    [address], [place], [place]  [address] Phone: [phone];  Fax: [phone]

## 2018-04-06 NOTE — Patient Instructions (Signed)
Dr Sallyanne Kuster recommends that you schedule a follow-up appointment in 12 months. You will receive a reminder letter in the mail two months in advance. If you don't receive a letter, please call our office to schedule the follow-up appointment.  If you need a refill on your cardiac medications before your next appointment, please call your pharmacy.   Dr C would like to know what your heart rate is during maximum exercise. Please report 3-4 readings back to [place].

## 2018-04-12 DIAGNOSIS — D0471 Carcinoma in situ of skin of right lower limb, including hip: Secondary | ICD-10-CM | POA: Diagnosis not present

## 2018-04-18 ENCOUNTER — Other Ambulatory Visit: Payer: Self-pay | Admitting: Internal Medicine

## 2018-04-18 DIAGNOSIS — Z1231 Encounter for screening mammogram for malignant neoplasm of breast: Secondary | ICD-10-CM

## 2018-05-02 ENCOUNTER — Encounter (HOSPITAL_COMMUNITY): Payer: Self-pay | Admitting: Emergency Medicine

## 2018-05-02 ENCOUNTER — Emergency Department (HOSPITAL_COMMUNITY): Payer: Medicare Other

## 2018-05-02 ENCOUNTER — Inpatient Hospital Stay (HOSPITAL_COMMUNITY)
Admission: EM | Admit: 2018-05-02 | Discharge: 2018-05-08 | DRG: 482 | Disposition: A | Discharge status: Skilled Nursing Facility | Payer: Medicare Other | Attending: Internal Medicine | Admitting: Internal Medicine

## 2018-05-02 DIAGNOSIS — I951 Orthostatic hypotension: Secondary | ICD-10-CM | POA: Diagnosis present

## 2018-05-02 DIAGNOSIS — R102 Pelvic and perineal pain: Secondary | ICD-10-CM | POA: Diagnosis not present

## 2018-05-02 DIAGNOSIS — C50919 Malignant neoplasm of unspecified site of unspecified female breast: Secondary | ICD-10-CM | POA: Diagnosis not present

## 2018-05-02 DIAGNOSIS — I5189 Other ill-defined heart diseases: Secondary | ICD-10-CM | POA: Diagnosis not present

## 2018-05-02 DIAGNOSIS — R001 Bradycardia, unspecified: Secondary | ICD-10-CM | POA: Diagnosis not present

## 2018-05-02 DIAGNOSIS — R52 Pain, unspecified: Secondary | ICD-10-CM

## 2018-05-02 DIAGNOSIS — Z96651 Presence of right artificial knee joint: Secondary | ICD-10-CM | POA: Diagnosis not present

## 2018-05-02 DIAGNOSIS — K5903 Drug induced constipation: Secondary | ICD-10-CM | POA: Diagnosis not present

## 2018-05-02 DIAGNOSIS — Z91013 Allergy to seafood: Secondary | ICD-10-CM | POA: Diagnosis not present

## 2018-05-02 DIAGNOSIS — I498 Other specified cardiac arrhythmias: Secondary | ICD-10-CM | POA: Diagnosis present

## 2018-05-02 DIAGNOSIS — Z7983 Long term (current) use of bisphosphonates: Secondary | ICD-10-CM

## 2018-05-02 DIAGNOSIS — Z923 Personal history of irradiation: Secondary | ICD-10-CM | POA: Diagnosis not present

## 2018-05-02 DIAGNOSIS — S72009A Fracture of unspecified part of neck of unspecified femur, initial encounter for closed fracture: Secondary | ICD-10-CM | POA: Diagnosis not present

## 2018-05-02 DIAGNOSIS — R9431 Abnormal electrocardiogram [ECG] [EKG]: Secondary | ICD-10-CM | POA: Diagnosis not present

## 2018-05-02 DIAGNOSIS — Z853 Personal history of malignant neoplasm of breast: Secondary | ICD-10-CM

## 2018-05-02 DIAGNOSIS — K5900 Constipation, unspecified: Secondary | ICD-10-CM | POA: Diagnosis not present

## 2018-05-02 DIAGNOSIS — R079 Chest pain, unspecified: Secondary | ICD-10-CM | POA: Diagnosis not present

## 2018-05-02 DIAGNOSIS — M9711XA Periprosthetic fracture around internal prosthetic right knee joint, initial encounter: Secondary | ICD-10-CM | POA: Diagnosis not present

## 2018-05-02 DIAGNOSIS — E7800 Pure hypercholesterolemia, unspecified: Secondary | ICD-10-CM | POA: Diagnosis not present

## 2018-05-02 DIAGNOSIS — S72401A Unspecified fracture of lower end of right femur, initial encounter for closed fracture: Secondary | ICD-10-CM | POA: Diagnosis not present

## 2018-05-02 DIAGNOSIS — R279 Unspecified lack of coordination: Secondary | ICD-10-CM | POA: Diagnosis not present

## 2018-05-02 DIAGNOSIS — R197 Diarrhea, unspecified: Secondary | ICD-10-CM | POA: Diagnosis not present

## 2018-05-02 DIAGNOSIS — I10 Essential (primary) hypertension: Secondary | ICD-10-CM | POA: Diagnosis not present

## 2018-05-02 DIAGNOSIS — I499 Cardiac arrhythmia, unspecified: Secondary | ICD-10-CM | POA: Diagnosis not present

## 2018-05-02 DIAGNOSIS — I519 Heart disease, unspecified: Secondary | ICD-10-CM | POA: Diagnosis not present

## 2018-05-02 DIAGNOSIS — I4510 Unspecified right bundle-branch block: Secondary | ICD-10-CM | POA: Diagnosis not present

## 2018-05-02 DIAGNOSIS — F329 Major depressive disorder, single episode, unspecified: Secondary | ICD-10-CM | POA: Diagnosis not present

## 2018-05-02 DIAGNOSIS — Z09 Encounter for follow-up examination after completed treatment for conditions other than malignant neoplasm: Secondary | ICD-10-CM

## 2018-05-02 DIAGNOSIS — E785 Hyperlipidemia, unspecified: Secondary | ICD-10-CM | POA: Diagnosis not present

## 2018-05-02 DIAGNOSIS — Z885 Allergy status to narcotic agent status: Secondary | ICD-10-CM

## 2018-05-02 DIAGNOSIS — W19XXXA Unspecified fall, initial encounter: Secondary | ICD-10-CM | POA: Diagnosis not present

## 2018-05-02 DIAGNOSIS — W010XXA Fall on same level from slipping, tripping and stumbling without subsequent striking against object, initial encounter: Secondary | ICD-10-CM | POA: Diagnosis present

## 2018-05-02 DIAGNOSIS — R0602 Shortness of breath: Secondary | ICD-10-CM | POA: Diagnosis not present

## 2018-05-02 DIAGNOSIS — R0902 Hypoxemia: Secondary | ICD-10-CM | POA: Diagnosis not present

## 2018-05-02 DIAGNOSIS — Y92008 Other place in unspecified non-institutional (private) residence as the place of occurrence of the external cause: Secondary | ICD-10-CM | POA: Diagnosis not present

## 2018-05-02 DIAGNOSIS — I739 Peripheral vascular disease, unspecified: Secondary | ICD-10-CM | POA: Diagnosis not present

## 2018-05-02 DIAGNOSIS — Z79899 Other long term (current) drug therapy: Secondary | ICD-10-CM

## 2018-05-02 DIAGNOSIS — R008 Other abnormalities of heart beat: Secondary | ICD-10-CM | POA: Diagnosis present

## 2018-05-02 DIAGNOSIS — I878 Other specified disorders of veins: Secondary | ICD-10-CM | POA: Diagnosis not present

## 2018-05-02 DIAGNOSIS — Y93E2 Activity, laundry: Secondary | ICD-10-CM | POA: Diagnosis not present

## 2018-05-02 DIAGNOSIS — S72451A Displaced supracondylar fracture without intracondylar extension of lower end of right femur, initial encounter for closed fracture: Secondary | ICD-10-CM | POA: Diagnosis present

## 2018-05-02 DIAGNOSIS — I440 Atrioventricular block, first degree: Secondary | ICD-10-CM | POA: Diagnosis not present

## 2018-05-02 DIAGNOSIS — Z743 Need for continuous supervision: Secondary | ICD-10-CM | POA: Diagnosis not present

## 2018-05-02 DIAGNOSIS — I872 Venous insufficiency (chronic) (peripheral): Secondary | ICD-10-CM | POA: Diagnosis not present

## 2018-05-02 DIAGNOSIS — M810 Age-related osteoporosis without current pathological fracture: Secondary | ICD-10-CM | POA: Diagnosis present

## 2018-05-02 DIAGNOSIS — R109 Unspecified abdominal pain: Secondary | ICD-10-CM

## 2018-05-02 DIAGNOSIS — S299XXA Unspecified injury of thorax, initial encounter: Secondary | ICD-10-CM | POA: Diagnosis not present

## 2018-05-02 DIAGNOSIS — I340 Nonrheumatic mitral (valve) insufficiency: Secondary | ICD-10-CM | POA: Diagnosis not present

## 2018-05-02 DIAGNOSIS — I119 Hypertensive heart disease without heart failure: Secondary | ICD-10-CM | POA: Diagnosis present

## 2018-05-02 DIAGNOSIS — S72451S Displaced supracondylar fracture without intracondylar extension of lower end of right femur, sequela: Secondary | ICD-10-CM | POA: Diagnosis not present

## 2018-05-02 DIAGNOSIS — S72301A Unspecified fracture of shaft of right femur, initial encounter for closed fracture: Secondary | ICD-10-CM | POA: Diagnosis not present

## 2018-05-02 DIAGNOSIS — M9701XA Periprosthetic fracture around internal prosthetic right hip joint, initial encounter: Secondary | ICD-10-CM | POA: Diagnosis not present

## 2018-05-02 DIAGNOSIS — S3993XA Unspecified injury of pelvis, initial encounter: Secondary | ICD-10-CM | POA: Diagnosis not present

## 2018-05-02 DIAGNOSIS — S7291XA Unspecified fracture of right femur, initial encounter for closed fracture: Secondary | ICD-10-CM | POA: Diagnosis present

## 2018-05-02 DIAGNOSIS — R600 Localized edema: Secondary | ICD-10-CM | POA: Diagnosis present

## 2018-05-02 DIAGNOSIS — Z01818 Encounter for other preprocedural examination: Secondary | ICD-10-CM | POA: Diagnosis not present

## 2018-05-02 DIAGNOSIS — S72491A Other fracture of lower end of right femur, initial encounter for closed fracture: Secondary | ICD-10-CM | POA: Diagnosis not present

## 2018-05-02 DIAGNOSIS — F339 Major depressive disorder, recurrent, unspecified: Secondary | ICD-10-CM | POA: Diagnosis not present

## 2018-05-02 LAB — CBC WITH DIFFERENTIAL/PLATELET
Abs Immature Granulocytes: 0.03 10*3/uL (ref 0.00–0.07)
Basophils Absolute: 0 10*3/uL (ref 0.0–0.1)
Basophils Relative: 1 %
EOS PCT: 1 %
Eosinophils Absolute: 0.1 10*3/uL (ref 0.0–0.5)
HCT: 41.5 % (ref 36.0–46.0)
HEMOGLOBIN: 13.4 g/dL (ref 12.0–15.0)
Immature Granulocytes: 0 %
LYMPHS ABS: 1.5 10*3/uL (ref 0.7–4.0)
Lymphocytes Relative: 18 %
MCH: 31.5 pg (ref 26.0–34.0)
MCHC: 32.3 g/dL (ref 30.0–36.0)
MCV: 97.4 fL (ref 80.0–100.0)
MONO ABS: 0.5 10*3/uL (ref 0.1–1.0)
Monocytes Relative: 7 %
Neutro Abs: 6 10*3/uL (ref 1.7–7.7)
Neutrophils Relative %: 73 %
Platelets: 192 10*3/uL (ref 150–400)
RBC: 4.26 MIL/uL (ref 3.87–5.11)
RDW: 12.9 % (ref 11.5–15.5)
WBC: 8.2 10*3/uL (ref 4.0–10.5)
nRBC: 0 % (ref 0.0–0.2)

## 2018-05-02 LAB — TYPE AND SCREEN
ABO/RH(D): A POS
Antibody Screen: NEGATIVE

## 2018-05-02 LAB — BASIC METABOLIC PANEL
Anion gap: 13 (ref 5–15)
BUN: 33 mg/dL — ABNORMAL HIGH (ref 8–23)
CALCIUM: 9.7 mg/dL (ref 8.9–10.3)
CO2: 27 mmol/L (ref 22–32)
CREATININE: 0.85 mg/dL (ref 0.44–1.00)
Chloride: 102 mmol/L (ref 98–111)
GFR calc Af Amer: >60 mL/min (ref >60)
Glucose, Bld: 100 mg/dL — ABNORMAL HIGH (ref 70–99)
POTASSIUM: 3.6 mmol/L (ref 3.5–5.1)
SODIUM: 142 mmol/L (ref 135–145)

## 2018-05-02 LAB — TSH: TSH: 1.753 u[IU]/mL (ref 0.350–4.500)

## 2018-05-02 LAB — MAGNESIUM: MAGNESIUM: 1.7 mg/dL (ref 1.7–2.4)

## 2018-05-02 LAB — TROPONIN I

## 2018-05-02 LAB — PHOSPHORUS: Phosphorus: 4 mg/dL (ref 2.5–4.6)

## 2018-05-02 LAB — PROTIME-INR
INR: 0.88
PROTHROMBIN TIME: 11.9 s (ref 11.4–15.2)

## 2018-05-02 MED ORDER — ESCITALOPRAM OXALATE 20 MG PO TABS
20.0000 mg | ORAL_TABLET | Freq: Every day | ORAL | Status: DC
  Administered 2018-05-04 – 2018-05-08 (×5): 20 mg via ORAL
  Filled 2018-05-02 (×5): qty 1

## 2018-05-02 MED ORDER — HYDROCODONE-ACETAMINOPHEN 5-325 MG PO TABS
1.0000 | ORAL_TABLET | Freq: Four times a day (QID) | ORAL | Status: DC | PRN
  Administered 2018-05-02 – 2018-05-04 (×4): 2 via ORAL
  Filled 2018-05-02 (×4): qty 2

## 2018-05-02 MED ORDER — CEFAZOLIN SODIUM-DEXTROSE 2-4 GM/100ML-% IV SOLN
2.0000 g | INTRAVENOUS | Status: AC
  Administered 2018-05-03: 2 g via INTRAVENOUS
  Filled 2018-05-02: qty 100

## 2018-05-02 MED ORDER — TRANEXAMIC ACID-NACL 1000-0.7 MG/100ML-% IV SOLN
1000.0000 mg | INTRAVENOUS | Status: AC
  Administered 2018-05-03: 1000 mg via INTRAVENOUS
  Filled 2018-05-02: qty 100

## 2018-05-02 MED ORDER — METHOCARBAMOL 500 MG PO TABS
500.0000 mg | ORAL_TABLET | Freq: Four times a day (QID) | ORAL | Status: DC | PRN
  Administered 2018-05-02 – 2018-05-04 (×3): 500 mg via ORAL
  Filled 2018-05-02 (×4): qty 1

## 2018-05-02 MED ORDER — CHLORHEXIDINE GLUCONATE 4 % EX LIQD
60.0000 mL | Freq: Once | CUTANEOUS | Status: DC
  Filled 2018-05-02: qty 60

## 2018-05-02 MED ORDER — POVIDONE-IODINE 10 % EX SWAB
2.0000 "application " | Freq: Once | CUTANEOUS | Status: AC
  Administered 2018-05-03: 2 via TOPICAL

## 2018-05-02 MED ORDER — ONDANSETRON HCL 4 MG/2ML IJ SOLN
4.0000 mg | Freq: Once | INTRAMUSCULAR | Status: AC
  Administered 2018-05-02: 4 mg via INTRAVENOUS
  Filled 2018-05-02: qty 2

## 2018-05-02 MED ORDER — MAGNESIUM SULFATE IN D5W 1-5 GM/100ML-% IV SOLN
1.0000 g | Freq: Once | INTRAVENOUS | Status: AC
  Administered 2018-05-03: 1 g via INTRAVENOUS
  Filled 2018-05-02: qty 100

## 2018-05-02 MED ORDER — SODIUM CHLORIDE 0.9 % IV SOLN
INTRAVENOUS | Status: DC
  Administered 2018-05-02 – 2018-05-03 (×2): via INTRAVENOUS

## 2018-05-02 MED ORDER — SIMVASTATIN 20 MG PO TABS
20.0000 mg | ORAL_TABLET | Freq: Every day | ORAL | Status: DC
  Administered 2018-05-04 – 2018-05-08 (×5): 20 mg via ORAL
  Filled 2018-05-02 (×5): qty 1

## 2018-05-02 MED ORDER — MORPHINE SULFATE (PF) 2 MG/ML IV SOLN: 0.5000 mg | INTRAVENOUS | Status: DC | PRN

## 2018-05-02 MED ORDER — METHOCARBAMOL 1000 MG/10ML IJ SOLN
500.0000 mg | Freq: Four times a day (QID) | INTRAVENOUS | Status: DC | PRN
  Filled 2018-05-02: qty 5

## 2018-05-02 MED ORDER — MORPHINE SULFATE (PF) 4 MG/ML IV SOLN
4.0000 mg | INTRAVENOUS | Status: DC | PRN
  Administered 2018-05-03: 4 mg via INTRAVENOUS
  Filled 2018-05-02: qty 1

## 2018-05-02 NOTE — ED Notes (Signed)
Patient and family state that they would prefer to be seen by [name], their previous orthopedist if at all possible. Dr. Roel Cluck aware of request.

## 2018-05-02 NOTE — ED Provider Notes (Signed)
[facility] DEPT Provider Note   CSN: [id number] Arrival date & time: 05/02/18  1734     History   Chief Complaint Chief Complaint  Patient presents with  . Fall    HPI [name] is a 84 y.o. female.  HPI Patient presents to the emergency room for evaluation of right leg pain.  Patient states she was at home doing laundry.  She went to turn when she lost her balance and fell.  Patient developed severe pain in her right leg.  She was unable to stand.  She had to crawl across the room and eventually was able to get help.  Patient denies any loss of consciousness.  She does not have any trouble with any chest pain or shortness of breath.  Pain is primarily in the right thigh and upper knee region.  Any movement of her hip increases the pain.  She denies any lacerations.  No numbness or weakness.  Patient was given 100 mcg of fentanyl by EMS.   Past Medical History:  Diagnosis Date  . Arthritis    "in my back"  . Breast cancer ([place]) 1996   left breast cancer   . Complication of anesthesia 04/18/2012   "didn't tolerate it today very well; had the shakes and very hard time w/it"  . Depression 11/20/2012  . DVT (deep venous thrombosis) ([place])   . High cholesterol   . History of alcoholism ([place])    7 1/2 years clean  . Hypertension   . Osteoporosis   . Peripheral vascular disease (HCC)    hx of ligation   . Personal history of radiation therapy 1993   Left Breast Cancer  . Spinal stenosis     Patient Active Problem List   Diagnosis Date Noted  . Femur fracture, right ([place]) 05/02/2018  . Left ventricular diastolic dysfunction with preserved systolic function 24/23/5361  . PAT (paroxysmal atrial tachycardia) ([place]) 03/24/2016  . Diastolic dysfunction without heart failure 03/24/2016  . Peripheral venous insufficiency 03/24/2016  . Closed wedge compression fracture of fourth lumbar vertebra ([place])   . Near syncope 10/20/2014  . Orthostatic  hypotension 10/20/2014  . Fatigue 10/19/2014  . Syncope 10/19/2014  . Generalized weakness   . Elevated troponin   . Dyspnea on exertion   . Rib fracture 09/05/2014  . HX: breast cancer 09/26/2013  . Depression 11/20/2012  . Unspecified constipation 11/20/2012  . Other and unspecified hyperlipidemia 11/20/2012  . Essential hypertension, benign 11/20/2012  . Postoperative anemia due to acute blood loss 11/01/2012  . Hyponatremia 11/01/2012  . Hypokalemia 11/01/2012  . OA (osteoarthritis) of knee 10/29/2012  . Septic joint of right knee joint ([place]) 05/23/2012  . Elevated liver enzymes 05/23/2012    Past Surgical History:  Procedure Laterality Date  . BREAST LUMPECTOMY Left 1996  . I&D EXTREMITY  04/17/2012   Procedure: IRRIGATION AND DEBRIDEMENT EXTREMITY;  Surgeon: Rudean Haskell, MD;  Location: [facility];  Service: Orthopedics;  Laterality: Right;  . KNEE ARTHROSCOPY  04/17/2012   Procedure: ARTHROSCOPY KNEE;  Surgeon: Rudean Haskell, MD;  Location: [facility];  Service: Orthopedics;  Laterality: Right;  . KYPHOPLASTY  2009, 2013   thorasic, lumbar  . MENISECTOMY Right 04/11/2012  . POSTERIOR LAMINECTOMY / DECOMPRESSION LUMBAR SPINE  1980's  . rotator cuff surgery  Right   . TONSILLECTOMY AND ADENOIDECTOMY     "I was a child"  . TOTAL KNEE ARTHROPLASTY Right 10/29/2012   Procedure: RIGHT TOTAL KNEE  ARTHROPLASTY;  Surgeon: Gearlean Alf, MD;  Location[facility];  Service: Orthopedics;  Laterality: Right;  . VEIN LIGATION       OB History   None      Home Medications    Prior to Admission medications   Medication Sig Start Date End Date Taking? Authorizing Provider  acetaminophen (TYLENOL) 500 MG tablet Take 1,000 mg by mouth daily as needed (back pain).   Yes [provider]  alendronate (FOSAMAX) 70 MG tablet Take 70 mg by mouth once a week. Every Sunday. 08/21/13  Yes [provider]  amoxicillin (AMOXIL) 500 MG capsule FOR DENTAL USE ONLY 11/16/14  Yes  [provider]  escitalopram (LEXAPRO) 20 MG tablet TAKE 1 TABLET BY MOUTH ONCE DAILY 08/04/15  Yes [provider]  Multiple Vitamins-Minerals (MULTIVITAMIN ADULT) TABS MULTIVITAMINS TABS 08/17/09  Yes [provider]  simvastatin (ZOCOR) 20 MG tablet Take 20 mg by mouth daily. 03/30/10  Yes [provider]  triamterene-hydrochlorothiazide (MAXZIDE) 75-50 MG tablet Take 0.5 tablets by mouth daily. 05/22/17  Yes Croitoru, Mihai, MD    Family History Family History  Problem Relation Age of Onset  . Kidney failure Mother   . Anuerysm Father        AAA    Social History Social History   Tobacco Use  . Smoking status: Never Smoker  . Smokeless tobacco: Never Used  Substance Use Topics  . Alcohol use: No  . Drug use: No     Allergies   Oysters [shellfish allergy] and Codeine   Review of Systems Review of Systems  All other systems reviewed and are negative.    Physical Exam Updated Vital Signs BP 113/67   Pulse 77   Temp (!) 97.5 F (36.4 C) (Oral)   Resp 19   Ht 1.689 m (5' 6.5")   Wt 70.3 kg   SpO2 95%   BMI 24.64 kg/m   Physical Exam  Constitutional: No distress.  HENT:  Head: Normocephalic and atraumatic.  Right Ear: External ear normal.  Left Ear: External ear normal.  Eyes: Conjunctivae are normal. Right eye exhibits no discharge. Left eye exhibits no discharge. No scleral icterus.  Neck: Neck supple. No tracheal deviation present.  Cardiovascular: Normal rate, regular rhythm and intact distal pulses.  Pulmonary/Chest: Effort normal and breath sounds normal. No stridor. No respiratory distress. She has no wheezes. She has no rales.  Abdominal: Soft. Bowel sounds are normal. She exhibits no distension. There is no tenderness. There is no rebound and no guarding.  Musculoskeletal: She exhibits no edema.       Right hip: She exhibits decreased range of motion, tenderness and bony tenderness.  No swelling, edema or deformity  noted around the right knee, patient points to the pain in her mid thigh,, significant pain with range of motion of her right hip, right leg is internally rotated  Neurological: She is alert. She has normal strength. No cranial nerve deficit (no facial droop, extraocular movements intact, no slurred speech) or sensory deficit. She exhibits normal muscle tone. She displays no seizure activity. Coordination normal.  Skin: Skin is warm and dry. No rash noted.  Psychiatric: She has a normal mood and affect.  Nursing note and vitals reviewed.    ED Treatments / Results  Labs (all labs ordered are listed, but only abnormal results are displayed) Labs Reviewed  BASIC METABOLIC PANEL - Abnormal; Notable for the following components:      Result Value  Glucose, Bld 100 (*)    BUN 33 (*)    All other components within normal limits  CBC WITH DIFFERENTIAL/PLATELET  PROTIME-INR  URINALYSIS, ROUTINE W REFLEX MICROSCOPIC  TYPE AND SCREEN    EKG EKG Interpretation  Date/Time:  Wednesday May 02 2018 17:59:28 EDT Ventricular Rate:  76 PR Interval:    QRS Duration: 153 QT Interval:  458 QTC Calculation: 515 R Axis:   102 Text Interpretation:  Sinus rhythm Supraventricular bigeminy , new since last tracing Prolonged PR interval RBBB and LPFB Confirmed by Dorie Rank [phone]) on 05/02/2018 6:01:19 PM   Radiology Dg Chest 1 View  Result Date: 05/02/2018 CLINICAL DATA:  Status post fall with pain EXAM: CHEST  1 VIEW COMPARISON:  October 18, 2015 FINDINGS: The heart size and mediastinal contours are stable. The heart size is enlarged. Both lungs are clear. There is no pneumothorax. Chronic deformities of several lateral left ribs are identified. Surgical clips are noted in the left axilla. IMPRESSION: No acute cardiopulmonary disease identified. Electronically Signed   By: Abelardo Diesel M.D.   On: 05/02/2018 19:50   Dg Pelvis 1-2 Views  Result Date: 05/02/2018 CLINICAL DATA:  Status post fall  with pelvic pain. EXAM: PELVIS - 1-2 VIEW COMPARISON:  None. FINDINGS: There is no evidence of pelvic fracture or dislocation. Degenerative joint changes of bilateral hips with narrowed joint space and osteophyte formation are noted. IMPRESSION: No acute fracture or dislocation. Electronically Signed   By: Abelardo Diesel M.D.   On: 05/02/2018 19:50   Dg Femur, Min 2 Views Right  Result Date: 05/02/2018 CLINICAL DATA:  Status post fall with right femur pain. EXAM: RIGHT FEMUR 2 VIEWS COMPARISON:  None. FINDINGS: There is comminuted displaced fracture of the distal right femoral shaft above the knee replacement. There is no dislocation. IMPRESSION: Fracture of distal right femoral shaft. Electronically Signed   By: Abelardo Diesel M.D.   On: 05/02/2018 19:51    Procedures Procedures (including critical care time)  Medications Ordered in ED Medications  0.9 %  sodium chloride infusion ( Intravenous New Bag/Given 05/02/18 1814)  morphine 4 MG/ML injection 4 mg (has no administration in time range)  ondansetron (ZOFRAN) injection 4 mg (4 mg Intravenous Given 05/02/18 1812)     Initial Impression / Assessment and Plan / ED Course  I have reviewed the triage vital signs and the nursing notes.  Pertinent labs & imaging results that were available during my care of the patient were reviewed by me and considered in my medical decision making (see chart for details).  Clinical Course as of May 02 2054  Wed May 02, 2018  2055 Labs reviewed.  No abnormalities noted   [JK]    Clinical Course User Index [JK] Dorie Rank, MD  Pt presented to the ED after a fall.  No syncope or weakness, mechanical fall. Xray shows a distal femur fracture.  Knee immobilizer placed.   Discussed with Dr Rolena Infante.  Dr Delfino Lovett will see in the am.  D/w Dr Roel Cluck for admission.  Final Clinical Impressions(s) / ED Diagnoses   Final diagnoses:  Closed fracture of distal end of right femur, unspecified fracture morphology, initial  encounter [facility])      Dorie Rank, MD 05/02/18 2055

## 2018-05-02 NOTE — H&P (Signed)
[name] FGH:[id number] DOB: 18-Dec-1931 DOA: 05/02/2018     PCP: Burnard Bunting, MD   Outpatient Specialists:   Orthopedics: Julienne Kass  Patient arrived to ER on 05/02/18 at 1734  Patient coming from: home Lives  With family   Chief Complaint:  Chief Complaint  Patient presents with  . Fall    HPI: [name] is a 84 y.o. female with medical history significant of orthostatic hypotension, hyperlipidemia and lower extremity edema due to venous insufficiency, Left Breast Cancer 20 years ago     Presented with a fall she was trying to saw a pair of pants. No head injury.   Resulting in rotation of right leg no LOC she is not sure how she fell she had to crawl on the floor for an hour in order to get to the phone to call EMS not on any blood thinners EMS arrived administered 100 mcg of fentanyl.  She denies shortness of breath or chest pain she goes to the Gym twice no exertional chest pain. Able to walk up a flight of stairs.     Regarding pertinent Chronic problems: Orthostatic hypotension and leg edema due to venous stasis followed by cardiology wears compression stockings   hx of Right artificial knee had a total knee replacement 3 years ago     March 2016, her echocardiogram showed normal findings with a session of moderate left atrial enlargement and mild diastolic left ventricular dysfunction.  While in ER:   work-up is pretty reassuring but it does show that she had a distal femur fracture  The following Work up has been ordered so far:  Orders Placed This Encounter  Procedures  . DG Chest 1 View  . DG FEMUR, MIN 2 VIEWS RIGHT  . DG Pelvis 1-2 Views  . Basic metabolic panel  . CBC WITH DIFFERENTIAL  . Protime-INR  . Diet NPO time specified  . Check CMS  . Bed rest  . Initiate Carrier Fluid Protocol  . Apply knee immobilizer  . Consult to orthopedic surgery  . Consult to hospitalist  . ED EKG  . EKG 12-Lead  . Type and screen [place]  . Insert peripheral IV     Following Medications were ordered in ER: Medications  0.9 %  sodium chloride infusion ( Intravenous New Bag/Given 05/02/18 1814)  morphine 4 MG/ML injection 4 mg (has no administration in time range)  ondansetron (ZOFRAN) injection 4 mg (4 mg Intravenous Given 05/02/18 1812)    Significant initial  Findings: Abnormal Labs Reviewed  BASIC METABOLIC PANEL - Abnormal; Notable for the following components:      Result Value   Glucose, Bld 100 (*)    BUN 33 (*)    All other components within normal limits    Na 142 K 3.6  Cr    Stable,  Lab Results  Component Value Date   CREATININE 0.85 05/02/2018   CREATININE 0.73 12/03/2017   CREATININE 0.84 01/23/2015     WBC 8.2  HG/HCT  stable,       Component Value Date/Time   HGB 13.4 05/02/2018 1808   HCT 41.5 05/02/2018 1808       UA  ordered   CXR -  NON acute  Right femur- fracture of distal right femoral shaft above the knee replacement   ECG:  Personally reviewed by me showing: HR : 76 Rhythm: RBBB,    nonspecific changes changes QTC 515  ED Triage Vitals  Enc Vitals Group     BP 05/02/18 1742 136/71     Pulse Rate 05/02/18 1742 79     Resp 05/02/18 1742 18     Temp 05/02/18 1744 (!) 97.5 F (36.4 C)     Temp Source 05/02/18 1744 Oral     SpO2 05/02/18 1742 95 %     Weight 05/02/18 1752 155 lb (70.3 kg)     Height 05/02/18 1752 5' 6.5" (1.689 m)     Head Circumference --      Peak Flow --      Pain Score 05/02/18 1751 8     Pain Loc --      Pain Edu? --      Excl. in [place]? --   TMAX(24)@       Latest  Blood pressure 113/67, pulse 77, temperature (!) 97.5 F (36.4 C), temperature source Oral, resp. rate 19, height 5' 6.5" (1.689 m), weight 70.3 kg, SpO2 95 %.    ER Provider Called: Orthopedics   Dr.  Duane Lope They Recommend admit to medicine Dr. Lyla Glassing to see in a.m. and to operate in a.m. Will see in AM   Hospitalist was called for admission for  right femoral distal shaft fracture   Review of Systems:    Pertinent positives include: fatigue, fall Constitutional:  No weight loss, night sweats, Fevers, chills,  weight loss  HEENT:  No headaches, Difficulty swallowing,Tooth/dental problems,Sore throat,  No sneezing, itching, ear ache, nasal congestion, post nasal drip,  Cardio-vascular:  No chest pain, Orthopnea, PND, anasarca, dizziness, palpitations.no Bilateral lower extremity swelling  GI:  No heartburn, indigestion, abdominal pain, nausea, vomiting, diarrhea, change in bowel habits, loss of appetite, melena, blood in stool, hematemesis Resp:  no shortness of breath at rest. No dyspnea on exertion, No excess mucus, no productive cough, No non-productive cough, No coughing up of blood.No change in color of mucus.No wheezing. Skin:  no rash or lesions. No jaundice GU:  no dysuria, change in color of urine, no urgency or frequency. No straining to urinate.  No flank pain.  Musculoskeletal:  No joint pain or no joint swelling. No decreased range of motion. No back pain.  Psych:  No change in mood or affect. No depression or anxiety. No memory loss.  Neuro: no localizing neurological complaints, no tingling, no weakness, no double vision, no gait abnormality, no slurred speech, no confusion  All systems reviewed and apart from [place] all are negative  Past Medical History:   Past Medical History:  Diagnosis Date  . Arthritis    "in my back"  . Breast cancer ([place]) 1996   left breast cancer   . Complication of anesthesia 04/18/2012   "didn't tolerate it today very well; had the shakes and very hard time w/it"  . Depression 11/20/2012  . DVT (deep venous thrombosis) ([place])   . High cholesterol   . History of alcoholism ([place])    7 1/2 years clean  . Hypertension   . Osteoporosis   . Peripheral vascular disease ([facility])    hx of ligation   . Personal history of radiation therapy 1993   Left Breast Cancer  . Spinal stenosis        Past Surgical History:  Procedure Laterality Date  . BREAST LUMPECTOMY Left 1996  . I&D EXTREMITY  04/17/2012   Procedure: IRRIGATION AND DEBRIDEMENT EXTREMITY;  Surgeon: Rudean Haskell, MD;  Location: [facility];  Service: Orthopedics;  Laterality: Right;  .  KNEE ARTHROSCOPY  04/17/2012   Procedure: ARTHROSCOPY KNEE;  Surgeon: Rudean Haskell, MD;  Location: [facility];  Service: Orthopedics;  Laterality: Right;  . KYPHOPLASTY  2009, 2013   thorasic, lumbar  . MENISECTOMY Right 04/11/2012  . POSTERIOR LAMINECTOMY / DECOMPRESSION LUMBAR SPINE  1980's  . rotator cuff surgery  Right   . TONSILLECTOMY AND ADENOIDECTOMY     "I was a child"  . TOTAL KNEE ARTHROPLASTY Right 10/29/2012   Procedure: RIGHT TOTAL KNEE ARTHROPLASTY;  Surgeon: Gearlean Alf, MD;  Location: [facility];  Service: Orthopedics;  Laterality: Right;  . VEIN LIGATION      Social History:  Ambulatory   cane,       reports that she has never smoked. She has never used smokeless tobacco. She reports that she does not drink alcohol or use drugs.     Family History:   Family History  Problem Relation Age of Onset  . Kidney failure Mother   . Anuerysm Father        AAA    Allergies: Allergies  Allergen Reactions  . Oysters [Shellfish Allergy] Nausea And Vomiting  . Codeine Nausea And Vomiting     Prior to Admission medications   Medication Sig Start Date End Date Taking? Authorizing Provider  acetaminophen (TYLENOL) 500 MG tablet Take 1,000 mg by mouth daily as needed (back pain).   Yes [provider]  alendronate (FOSAMAX) 70 MG tablet Take 70 mg by mouth once a week. Every Sunday. 08/21/13  Yes [provider]  amoxicillin (AMOXIL) 500 MG capsule FOR DENTAL USE ONLY 11/16/14  Yes [provider]  escitalopram (LEXAPRO) 20 MG tablet TAKE 1 TABLET BY MOUTH ONCE DAILY 08/04/15  Yes [provider]  Multiple Vitamins-Minerals (MULTIVITAMIN ADULT) TABS MULTIVITAMINS TABS 08/17/09  Yes  [provider]  simvastatin (ZOCOR) 20 MG tablet Take 20 mg by mouth daily. 03/30/10  Yes [provider]  triamterene-hydrochlorothiazide (MAXZIDE) 75-50 MG tablet Take 0.5 tablets by mouth daily. 05/22/17  Yes Croitoru, Mihai, MD   Physical Exam: Blood pressure 113/67, pulse 77, temperature (!) 97.5 F (36.4 C), temperature source Oral, resp. rate 19, height 5' 6.5" (1.689 m), weight 70.3 kg, SpO2 95 %. 1. General:  in No Acute distress  well  -appearing 2. Psychological: Alert and    Oriented 3. Head/ENT:     Dry Mucous Membranes                          Head Non traumatic, neck supple                         Poor Dentition 4. SKIN:   decreased Skin turgor,  Skin clean Dry and intact no rash 5. Heart:irregular rate and rhythm no  Murmur, no Rub or gallop 6. Lungs:  no wheezes or crackles   7. Abdomen: Soft,  non-tender, Non distended  obese  bowel sounds present 8. Lower extremities: no clubbing, cyanosis, or  Edema right lower extremity externally rotated 9. Neurologically Grossly intact, moving all 4 extremities equally   10. MSK: Normal range of motion limited due to pain in lower extremity   LABS:     Recent Labs  Lab 05/02/18 1808  WBC 8.2  NEUTROABS 6.0  HGB 13.4  HCT 41.5  MCV 97.4  PLT 093   Basic Metabolic Panel: Recent Labs  Lab 05/02/18 1808  NA 142  K 3.6  CL 102  CO2 27  GLUCOSE 100*  BUN 33*  CREATININE 0.85  CALCIUM 9.7      No results for input(s): AST, ALT, ALKPHOS, BILITOT, PROT, ALBUMIN in the last 168 hours. No results for input(s): LIPASE, AMYLASE in the last 168 hours. No results for input(s): AMMONIA in the last 168 hours.    HbA1C: No results for input(s): HGBA1C in the last 72 hours. CBG: No results for input(s): GLUCAP in the last 168 hours.    Urine analysis:    Component Value Date/Time   COLORURINE YELLOW 10/19/2014 1450   APPEARANCEUR CLEAR 10/19/2014 1450   LABSPEC 1.044 (H) 10/19/2014 1450   PHURINE  7.5 10/19/2014 1450   GLUCOSEU NEGATIVE 10/19/2014 1450   HGBUR NEGATIVE 10/19/2014 1450   BILIRUBINUR NEGATIVE 10/19/2014 1450   KETONESUR NEGATIVE 10/19/2014 1450   PROTEINUR NEGATIVE 10/19/2014 1450   UROBILINOGEN 1.0 10/19/2014 1450   NITRITE NEGATIVE 10/19/2014 1450   LEUKOCYTESUR SMALL (A) 10/19/2014 1450     Cultures:    Component Value Date/Time   SDES SYNOVIAL RIGHT KNEE 10/29/2012 0654   SDES SYNOVIAL RIGHT KNEE 10/29/2012 0654   SPECREQUEST NONE 10/29/2012 0654   SPECREQUEST NONE 10/29/2012 0654   CULT NO GROWTH 3 DAYS 10/29/2012 0654   REPTSTATUS 10/29/2012 FINAL 10/29/2012 0654   REPTSTATUS 11/01/2012 FINAL 10/29/2012 0654     Radiological Exams on Admission: Dg Chest 1 View  Result Date: 05/02/2018 CLINICAL DATA:  Status post fall with pain EXAM: CHEST  1 VIEW COMPARISON:  October 18, 2015 FINDINGS: The heart size and mediastinal contours are stable. The heart size is enlarged. Both lungs are clear. There is no pneumothorax. Chronic deformities of several lateral left ribs are identified. Surgical clips are noted in the left axilla. IMPRESSION: No acute cardiopulmonary disease identified. Electronically Signed   By: Abelardo Diesel M.D.   On: 05/02/2018 19:50   Dg Pelvis 1-2 Views  Result Date: 05/02/2018 CLINICAL DATA:  Status post fall with pelvic pain. EXAM: PELVIS - 1-2 VIEW COMPARISON:  None. FINDINGS: There is no evidence of pelvic fracture or dislocation. Degenerative joint changes of bilateral hips with narrowed joint space and osteophyte formation are noted. IMPRESSION: No acute fracture or dislocation. Electronically Signed   By: Abelardo Diesel M.D.   On: 05/02/2018 19:50   Dg Femur, Min 2 Views Right  Result Date: 05/02/2018 CLINICAL DATA:  Status post fall with right femur pain. EXAM: RIGHT FEMUR 2 VIEWS COMPARISON:  None. FINDINGS: There is comminuted displaced fracture of the distal right femoral shaft above the knee replacement. There is no dislocation.  IMPRESSION: Fracture of distal right femoral shaft. Electronically Signed   By: Abelardo Diesel M.D.   On: 05/02/2018 19:51    Chart has been reviewed    Assessment/Plan   84 y.o. female with medical history significant of orthostatic hypotension, hyperlipidemia and lower extremity edema due to venous insufficiency, Left Breast Cancer Admitted for right femoral distal shaft fracture  Present on Admission:   . Femur fracture, right ([place])- management as per orthopedics,  plan to operate  in  a.m.   Keep nothing by mouth post midnight. Patient not on anticoagulation or antiplatelet agents  Ordered type and screen, Place Foley, order a vitamin D level  Patient at baseline  able to walk a flight of stairs or 100 feet      Patient denies any chest pain or shortness of breath currently and/or with exertion,    ECG  showing no evidence of acute ischemia but noted to have new bigemini and frequent pauses patient with PVCs patient is unclear why she fell cannot completely syncopal event will discuss with cardiology for preop clearance  no known history of coronary artery disease,  COPD   Liver failure  CKD  Given advanced age patient is at least moderate  risk    given new EKG changes and unclear etiology for the fall will benefit from evaluation by cardiology prior to proceeding to or  Possible syncope-  will order echo cycle CE     . Orthostatic hypotension -stable continue to monitor unsure if that contributed to before patient unable to recollect how she fell . Essential hypertension, benign -stable continue to monitor . Prolonged QT interval - - will monitor on tele avoid QT prolonging medications, rehydrate correct electrolytes  . Bigeminy -frequent pauses will obtain echogram check TSH monitor on telemetry obtain cardiac enzymes appreciate cardiology input given unclear etiology for fall cannot rule out syncopal event she has been followed by cardiology in the past with history of  orthostatic hypotension but history is not consistent with orthostasis   Other plan as per orders.  DVT prophylaxis:  SCD    Code Status:  FULL CODE    as per patient    I had personally discussed CODE STATUS with patient and family     Family Communication:   Family   at  Bedside  plan of care was discussed with  Daughter,   Husband,  Disposition Plan:    likely will need placement for rehabilitation                                        Consults called: Orthopedics , email [email or web address]  Admission status:   inpatient     Expect 2 midnight stay secondary to severity of patient's current illness including    Severe radiological abnormalities including femoral distal fracture   That are currently affecting medical management.  I expect  patient to be hospitalized for 2 midnights requiring inpatient medical care.  Patient is at high risk for adverse outcome (such as loss of life or disability) if not treated.  Indication for inpatient stay as follows:    severe pain requiring acute inpatient management,  inability to maintain oral hydration    Need for operative intervention Need for  IV fluids     Level of care    tele    24H        Demetries Coia 05/02/2018, 9:44 PM    [facility]  Pager [phone]   after 2 AM please page floor coverage PA If 7AM-7PM, please contact the day team taking care of the patient  [email or web address]  Password [email or web address]

## 2018-05-02 NOTE — ED Notes (Signed)
Bed: HE52 Expected date:  Expected time:  Means of arrival:  Comments: EMS/hip dislocation

## 2018-05-02 NOTE — ED Triage Notes (Signed)
-  Arrived via [place] from home, lives with husband -had a fall, pedal pulse is present, shortening of the leg, inward rotation R leg -no LOC, says she does not recall how she fell, she normally ambulates with a cane, had to crawl around the on the floor for about an hour until she got to the phone to call EMS -hx of knee replacement, no blood thinners -allergic to codine - 100 mch fentanyl, did not want anymore pain meds, stated she felt better, would not rate pain   Vitals  -BP 128/68  -RR 22 -O2 95% -P 86

## 2018-05-02 NOTE — ED Notes (Signed)
ED TO INPATIENT HANDOFF REPORT  Name/Age/Gender [name] 84 y.o. female  Code Status Code Status History    Date Active Date Inactive Code Status Order ID Comments User Context   01/23/2015 1419 01/24/2015 0325 Full Code [id number]  Luanne Bras, MD HOV   10/19/2014 1731 10/20/2014 2130 Full Code [id number]  Thurnell Lose, MD Inpatient   10/29/2012 1122 11/01/2012 1419 Full Code [id number]  Gearlean Alf, MD Inpatient    Advance Directive Documentation     Most Recent Value  Type of Advance Directive  Living will, Healthcare Power of Attorney  Pre-existing out of facility DNR order (yellow form or pink MOST form)  -  "MOST" Form in Place?  -      Home/SNF/Other Home  Chief Complaint fall  Level of Care/Admitting Diagnosis ED Disposition    ED Disposition Condition [place]: [facility] [[id number]]  Level of Care: Telemetry [5]  Admit to tele based on following criteria: Complex arrhythmia (Bradycardia/Tachycardia)  Admit to tele based on following criteria: Monitor QTC interval  Diagnosis: Femur fracture, right [facility]) [[id number]]  Admitting Physician: Toy Baker [[id number]]  Attending Physician: Toy Baker [[id number]]  Estimated length of stay: 3 - 4 days  Certification:: I certify this patient will need inpatient services for at least 2 midnights  PT Class (Do Not Modify): Inpatient [101]  PT Acc Code (Do Not Modify): Private [1]       Medical History Past Medical History:  Diagnosis Date  . Arthritis    "in my back"  . Breast cancer ([place]) 1996   left breast cancer   . Complication of anesthesia 04/18/2012   "didn't tolerate it today very well; had the shakes and very hard time w/it"  . Depression 11/20/2012  . DVT (deep venous thrombosis) ([place])   . High cholesterol   . History of alcoholism ([place])    7 1/2 years clean  . Hypertension   . Osteoporosis   . Peripheral vascular disease ([facility])    hx of ligation    . Personal history of radiation therapy 1993   Left Breast Cancer  . Spinal stenosis     Allergies Allergies  Allergen Reactions  . Oysters [Shellfish Allergy] Nausea And Vomiting  . Codeine Nausea And Vomiting    IV Location/Drains/Wounds Patient Lines/Drains/Airways Status   Active Line/Drains/Airways    Name:   Placement date:   Placement time:   Site:   Days:   Peripheral IV 05/02/18 Right Forearm   05/02/18    1745    Forearm   less than 1          Labs/Imaging Results for orders placed or performed during the hospital encounter of 05/02/18 (from the past 48 hour(s))  Basic metabolic panel     Status: Abnormal   Collection Time: 05/02/18  6:08 PM  Result Value Ref Range   Sodium 142 135 - 145 mmol/L   Potassium 3.6 3.5 - 5.1 mmol/L   Chloride 102 98 - 111 mmol/L   CO2 27 22 - 32 mmol/L   Glucose, Bld 100 (H) 70 - 99 mg/dL   BUN 33 (H) 8 - 23 mg/dL   Creatinine, Ser 0.85 0.44 - 1.00 mg/dL   Calcium 9.7 8.9 - 10.3 mg/dL   GFR calc non Af Amer >60 >60 mL/min   GFR calc Af Amer >60 >60 mL/min    Comment: (NOTE) The eGFR has been calculated using the CKD  EPI equation. This calculation has not been validated in all clinical situations. eGFR's persistently <60 mL/min signify possible Chronic Kidney Disease.    Anion gap 13 5 - 15    Comment: Performed at [facility], [place] [address], [place], [place] [address]  CBC WITH DIFFERENTIAL     Status: None   Collection Time: 05/02/18  6:08 PM  Result Value Ref Range   WBC 8.2 4.0 - 10.5 K/uL   RBC 4.26 3.87 - 5.11 MIL/uL   Hemoglobin 13.4 12.0 - 15.0 g/dL   HCT 41.5 36.0 - 46.0 %   MCV 97.4 80.0 - 100.0 fL   MCH 31.5 26.0 - 34.0 pg   MCHC 32.3 30.0 - 36.0 g/dL   RDW 12.9 11.5 - 15.5 %   Platelets 192 150 - 400 K/uL   nRBC 0.0 0.0 - 0.2 %   Neutrophils Relative % 73 %   Neutro Abs 6.0 1.7 - 7.7 K/uL   Lymphocytes Relative 18 %   Lymphs Abs 1.5 0.7 - 4.0 K/uL   Monocytes Relative 7 %    Monocytes Absolute 0.5 0.1 - 1.0 K/uL   Eosinophils Relative 1 %   Eosinophils Absolute 0.1 0.0 - 0.5 K/uL   Basophils Relative 1 %   Basophils Absolute 0.0 0.0 - 0.1 K/uL   Immature Granulocytes 0 %   Abs Immature Granulocytes 0.03 0.00 - 0.07 K/uL    Comment: Performed at [facility], [place] [address] [place], [place] [address]  Protime-INR     Status: None   Collection Time: 05/02/18  6:08 PM  Result Value Ref Range   Prothrombin Time 11.9 11.4 - 15.2 seconds   INR 0.88     Comment: Performed at [facility], [place] [address] [place], [place] [address]  Type and screen [place]     Status: None   Collection Time: 05/02/18  6:08 PM  Result Value Ref Range   ABO/RH(D) A POS    Antibody Screen NEG    Sample Expiration      05/05/2018 Performed at [facility], [place] [address], [place],  [address]    Dg Chest 1 View  Result Date: 05/02/2018 CLINICAL DATA:  Status post fall with pain EXAM: CHEST  1 VIEW COMPARISON:  October 18, 2015 FINDINGS: The heart size and mediastinal contours are stable. The heart size is enlarged. Both lungs are clear. There is no pneumothorax. Chronic deformities of several lateral left ribs are identified. Surgical clips are noted in the left axilla. IMPRESSION: No acute cardiopulmonary disease identified. Electronically Signed   By: Abelardo Diesel M.D.   On: 05/02/2018 19:50   Dg Pelvis 1-2 Views  Result Date: 05/02/2018 CLINICAL DATA:  Status post fall with pelvic pain. EXAM: PELVIS - 1-2 VIEW COMPARISON:  None. FINDINGS: There is no evidence of pelvic fracture or dislocation. Degenerative joint changes of bilateral hips with narrowed joint space and osteophyte formation are noted. IMPRESSION: No acute fracture or dislocation. Electronically Signed   By: Abelardo Diesel M.D.   On: 05/02/2018 19:50   Dg Femur, Min 2 Views Right  Result Date: 05/02/2018 CLINICAL DATA:  Status post  fall with right femur pain. EXAM: RIGHT FEMUR 2 VIEWS COMPARISON:  None. FINDINGS: There is comminuted displaced fracture of the distal right femoral shaft above the knee replacement. There is no dislocation. IMPRESSION: Fracture of distal right femoral shaft. Electronically Signed   By: Abelardo Diesel  M.D.   On: 05/02/2018 19:51   EKG Interpretation  Date/Time:  Wednesday May 02 2018 17:59:28 EDT Ventricular Rate:  76 PR Interval:    QRS Duration: 153 QT Interval:  458 QTC Calculation: 515 R Axis:   102 Text Interpretation:  Sinus rhythm Supraventricular bigeminy , new since last tracing Prolonged PR interval RBBB and LPFB Confirmed by Dorie Rank [phone]) on 05/02/2018 6:01:19 PM   Pending Labs Unresulted Labs (From admission, onward)    Start     Ordered   05/02/18 2153  TSH  Add-on,   R     05/02/18 2152   05/02/18 2100  Troponin I  Add-on,   R     05/02/18 2059   05/02/18 2056  Urinalysis, Routine w reflex microscopic  Once,   R     05/02/18 2055   Signed and Held  CBC  Tomorrow morning,   R     Signed and Held   Signed and Held  Basic metabolic panel  Tomorrow morning,   R     Signed and Held   Signed and Held  Albumin  Tomorrow morning,   R     Signed and Held   Signed and Held  VITAMIN D 25 Hydroxy (Vit-D Deficiency, Fractures)  Tomorrow morning,   R     Signed and Held   Signed and Held  Magnesium  Add-on,   R     Signed and Held   Signed and Held  Phosphorus  Add-on,   R     Signed and Held   Signed and Held  Troponin I (q 6hr x 3)  Now then every 6 hours,   R     Signed and Held          Vitals/Pain Today's Vitals   05/02/18 1847 05/02/18 1955 05/02/18 2030 05/02/18 2115  BP: 113/67  (!) 106/46 (!) 114/57  Pulse: 77  (!) 37 67  Resp: '19  18 18  '$ Temp:      TempSrc:      SpO2: 95%  95% 96%  Weight:      Height:      PainSc:  2       Isolation Precautions No active isolations  Medications Medications  0.9 %  sodium chloride infusion ( Intravenous  New Bag/Given 05/02/18 1814)  morphine 4 MG/ML injection 4 mg (has no administration in time range)  ondansetron (ZOFRAN) injection 4 mg (4 mg Intravenous Given 05/02/18 1812)    Mobility Currently unable to ambulate. Normally walks with cane.

## 2018-05-03 ENCOUNTER — Inpatient Hospital Stay (HOSPITAL_COMMUNITY): Payer: Medicare Other

## 2018-05-03 ENCOUNTER — Inpatient Hospital Stay (HOSPITAL_COMMUNITY): Payer: Medicare Other | Admitting: Certified Registered Nurse Anesthetist

## 2018-05-03 ENCOUNTER — Encounter (HOSPITAL_COMMUNITY): Payer: Self-pay | Admitting: *Deleted

## 2018-05-03 ENCOUNTER — Other Ambulatory Visit: Payer: Self-pay

## 2018-05-03 ENCOUNTER — Encounter (HOSPITAL_COMMUNITY)
Admission: EM | Disposition: A | Discharge status: Skilled Nursing Facility | Payer: Self-pay | Source: Home / Self Care | Attending: Internal Medicine

## 2018-05-03 DIAGNOSIS — I5189 Other ill-defined heart diseases: Secondary | ICD-10-CM

## 2018-05-03 DIAGNOSIS — I10 Essential (primary) hypertension: Secondary | ICD-10-CM

## 2018-05-03 DIAGNOSIS — I340 Nonrheumatic mitral (valve) insufficiency: Secondary | ICD-10-CM

## 2018-05-03 DIAGNOSIS — R52 Pain, unspecified: Secondary | ICD-10-CM

## 2018-05-03 DIAGNOSIS — I951 Orthostatic hypotension: Secondary | ICD-10-CM

## 2018-05-03 DIAGNOSIS — S72401A Unspecified fracture of lower end of right femur, initial encounter for closed fracture: Secondary | ICD-10-CM

## 2018-05-03 DIAGNOSIS — Z01818 Encounter for other preprocedural examination: Secondary | ICD-10-CM

## 2018-05-03 HISTORY — PX: ORIF PERIPROSTHETIC FRACTURE: SHX5034

## 2018-05-03 LAB — BASIC METABOLIC PANEL
ANION GAP: 10 (ref 5–15)
BUN: 22 mg/dL (ref 8–23)
CALCIUM: 9 mg/dL (ref 8.9–10.3)
CHLORIDE: 103 mmol/L (ref 98–111)
CO2: 26 mmol/L (ref 22–32)
CREATININE: 0.64 mg/dL (ref 0.44–1.00)
GFR calc non Af Amer: >60 mL/min (ref >60)
Glucose, Bld: 91 mg/dL (ref 70–99)
Potassium: 3.3 mmol/L — ABNORMAL LOW (ref 3.5–5.1)
SODIUM: 139 mmol/L (ref 135–145)

## 2018-05-03 LAB — TROPONIN I: Troponin I: <0.03 ng/mL (ref <0.03)

## 2018-05-03 LAB — CBC
HCT: 41.8 % (ref 36.0–46.0)
HEMOGLOBIN: 12.9 g/dL (ref 12.0–15.0)
MCH: 31.4 pg (ref 26.0–34.0)
MCHC: 30.9 g/dL (ref 30.0–36.0)
MCV: 101.7 fL — ABNORMAL HIGH (ref 80.0–100.0)
Platelets: 177 10*3/uL (ref 150–400)
RBC: 4.11 MIL/uL (ref 3.87–5.11)
RDW: 13 % (ref 11.5–15.5)
WBC: 7.5 10*3/uL (ref 4.0–10.5)
nRBC: 0 % (ref 0.0–0.2)

## 2018-05-03 LAB — ECHOCARDIOGRAM COMPLETE
Height: 66.5 in
Weight: 2610.25 oz

## 2018-05-03 LAB — SURGICAL PCR SCREEN
MRSA, PCR: NEGATIVE
Staphylococcus aureus: POSITIVE — AB

## 2018-05-03 LAB — ALBUMIN: ALBUMIN: 3.8 g/dL (ref 3.5–5.0)

## 2018-05-03 SURGERY — OPEN REDUCTION INTERNAL FIXATION (ORIF) PERIPROSTHETIC FRACTURE
Anesthesia: Spinal | Site: Leg Upper | Laterality: Right

## 2018-05-03 MED ORDER — PROPOFOL 10 MG/ML IV BOLUS
INTRAVENOUS | Status: AC
  Filled 2018-05-03: qty 40

## 2018-05-03 MED ORDER — ONDANSETRON HCL 4 MG/2ML IJ SOLN
INTRAMUSCULAR | Status: DC | PRN
  Administered 2018-05-03: 4 mg via INTRAVENOUS

## 2018-05-03 MED ORDER — PROPOFOL 10 MG/ML IV BOLUS
INTRAVENOUS | Status: AC
  Filled 2018-05-03: qty 20

## 2018-05-03 MED ORDER — BUPIVACAINE HCL (PF) 0.25 % IJ SOLN
INTRAMUSCULAR | Status: AC
  Filled 2018-05-03: qty 30

## 2018-05-03 MED ORDER — KETAMINE HCL 10 MG/ML IJ SOLN
INTRAMUSCULAR | Status: DC | PRN
  Administered 2018-05-03 (×5): 10 mg via INTRAVENOUS

## 2018-05-03 MED ORDER — ONDANSETRON HCL 4 MG/2ML IJ SOLN
INTRAMUSCULAR | Status: AC
  Filled 2018-05-03: qty 2

## 2018-05-03 MED ORDER — KETAMINE HCL 10 MG/ML IJ SOLN
INTRAMUSCULAR | Status: AC
  Filled 2018-05-03: qty 1

## 2018-05-03 MED ORDER — PHENYLEPHRINE 40 MCG/ML (10ML) SYRINGE FOR IV PUSH (FOR BLOOD PRESSURE SUPPORT)
PREFILLED_SYRINGE | INTRAVENOUS | Status: DC | PRN
  Administered 2018-05-03: 80 ug via INTRAVENOUS
  Administered 2018-05-03: 120 ug via INTRAVENOUS
  Administered 2018-05-03 (×2): 80 ug via INTRAVENOUS

## 2018-05-03 MED ORDER — FENTANYL CITRATE (PF) 100 MCG/2ML IJ SOLN
INTRAMUSCULAR | Status: AC
  Filled 2018-05-03: qty 2

## 2018-05-03 MED ORDER — MUPIROCIN 2 % EX OINT
1.0000 "application " | TOPICAL_OINTMENT | Freq: Two times a day (BID) | CUTANEOUS | Status: AC
  Administered 2018-05-03 – 2018-05-04 (×4): 1 via NASAL
  Filled 2018-05-03: qty 22

## 2018-05-03 MED ORDER — DEXAMETHASONE SODIUM PHOSPHATE 10 MG/ML IJ SOLN
INTRAMUSCULAR | Status: AC
  Filled 2018-05-03: qty 1

## 2018-05-03 MED ORDER — SODIUM CHLORIDE 0.9 % IJ SOLN
INTRAMUSCULAR | Status: AC
  Filled 2018-05-03: qty 50

## 2018-05-03 MED ORDER — PHENYLEPHRINE HCL-NACL 10-0.9 MG/250ML-% IV SOLN
INTRAVENOUS | Status: AC
  Filled 2018-05-03: qty 250

## 2018-05-03 MED ORDER — POTASSIUM CHLORIDE CRYS ER 20 MEQ PO TBCR
40.0000 meq | EXTENDED_RELEASE_TABLET | Freq: Once | ORAL | Status: AC
  Administered 2018-05-04: 40 meq via ORAL
  Filled 2018-05-03: qty 2

## 2018-05-03 MED ORDER — DEXAMETHASONE SODIUM PHOSPHATE 10 MG/ML IJ SOLN
INTRAMUSCULAR | Status: DC | PRN
  Administered 2018-05-03: 10 mg via INTRAVENOUS

## 2018-05-03 MED ORDER — FENTANYL CITRATE (PF) 100 MCG/2ML IJ SOLN
INTRAMUSCULAR | Status: DC | PRN
  Administered 2018-05-03 (×3): 25 ug via INTRAVENOUS

## 2018-05-03 MED ORDER — BUPIVACAINE IN DEXTROSE 0.75-8.25 % IT SOLN
INTRATHECAL | Status: DC | PRN
  Administered 2018-05-03: 12 mg via INTRATHECAL

## 2018-05-03 MED ORDER — PROPOFOL 500 MG/50ML IV EMUL
INTRAVENOUS | Status: DC | PRN
  Administered 2018-05-03: 35 ug/kg/min via INTRAVENOUS

## 2018-05-03 MED ORDER — STERILE WATER FOR IRRIGATION IR SOLN
Status: DC | PRN
  Administered 2018-05-03: 2000 mL

## 2018-05-03 MED ORDER — SODIUM CHLORIDE 0.9 % IR SOLN
Status: DC | PRN
  Administered 2018-05-03: 3000 mL

## 2018-05-03 MED ORDER — FENTANYL CITRATE (PF) 100 MCG/2ML IJ SOLN: 25.0000 ug | INTRAMUSCULAR | Status: DC | PRN

## 2018-05-03 MED ORDER — KETOROLAC TROMETHAMINE 30 MG/ML IJ SOLN
INTRAMUSCULAR | Status: AC
  Filled 2018-05-03: qty 1

## 2018-05-03 MED ORDER — SODIUM CHLORIDE 0.9 % IV SOLN
INTRAVENOUS | Status: DC | PRN
  Administered 2018-05-03: 50 ug/min via INTRAVENOUS

## 2018-05-03 MED ORDER — 0.9 % SODIUM CHLORIDE (POUR BTL) OPTIME
TOPICAL | Status: DC | PRN
  Administered 2018-05-03: 1000 mL

## 2018-05-03 MED ORDER — LACTATED RINGERS IV SOLN
INTRAVENOUS | Status: DC
  Administered 2018-05-03 (×2): via INTRAVENOUS

## 2018-05-03 SURGICAL SUPPLY — 69 items
BAG ZIPLOCK 12X15 (MISCELLANEOUS) ×2 IMPLANT
BANDAGE ACE 4X5 VEL STRL LF (GAUZE/BANDAGES/DRESSINGS) ×2 IMPLANT
BANDAGE ACE 6X5 VEL STRL LF (GAUZE/BANDAGES/DRESSINGS) ×2 IMPLANT
BIT DRILL CALIBRATED 4.3MMX365 (DRILL) ×1 IMPLANT
BIT DRILL CROWE PNT TWST 4.5MM (DRILL) ×1 IMPLANT
BNDG COHESIVE 4X5 TAN STRL (GAUZE/BANDAGES/DRESSINGS) ×2 IMPLANT
BNDG GAUZE ELAST 4 BULKY (GAUZE/BANDAGES/DRESSINGS) ×2 IMPLANT
COVER SURGICAL LIGHT HANDLE (MISCELLANEOUS) ×2 IMPLANT
COVER WAND RF STERILE (DRAPES) IMPLANT
DERMABOND ADVANCED (GAUZE/BANDAGES/DRESSINGS) ×2
DERMABOND ADVANCED .7 DNX12 (GAUZE/BANDAGES/DRESSINGS) ×2 IMPLANT
DRAPE C-ARM 42X120 X-RAY (DRAPES) ×2 IMPLANT
DRAPE C-ARMOR (DRAPES) ×2 IMPLANT
DRAPE EXTREMITY T 121X128X90 (DRAPE) ×2 IMPLANT
DRAPE ORTHO SPLIT 77X108 STRL (DRAPES)
DRAPE POUCH INSTRU U-SHP 10X18 (DRAPES) ×2 IMPLANT
DRAPE SHEET LG 3/4 BI-LAMINATE (DRAPES) ×6 IMPLANT
DRAPE SURG ORHT 6 SPLT 77X108 (DRAPES) IMPLANT
DRAPE U-SHAPE 47X51 STRL (DRAPES) ×4 IMPLANT
DRESSING AQUACEL AG SP 3.5X4 (GAUZE/BANDAGES/DRESSINGS) ×1 IMPLANT
DRILL CALIBRATED 4.3MMX365 (DRILL) ×2
DRILL CROWE POINT TWIST 4.5MM (DRILL) ×2
DRSG AQUACEL AG ADV 3.5X 4 (GAUZE/BANDAGES/DRESSINGS) ×2 IMPLANT
DRSG AQUACEL AG ADV 3.5X10 (GAUZE/BANDAGES/DRESSINGS) ×2 IMPLANT
DRSG AQUACEL AG SP 3.5X4 (GAUZE/BANDAGES/DRESSINGS) ×2
DRSG EMULSION OIL 3X16 NADH (GAUZE/BANDAGES/DRESSINGS) ×2 IMPLANT
DURAPREP 26ML APPLICATOR (WOUND CARE) ×2 IMPLANT
ELECT BLADE TIP CTD 4 INCH (ELECTRODE) ×2 IMPLANT
ELECT REM PT RETURN 15FT ADLT (MISCELLANEOUS) ×2 IMPLANT
GAUZE SPONGE 4X4 12PLY STRL (GAUZE/BANDAGES/DRESSINGS) ×2 IMPLANT
GLOVE BIO SURGEON STRL SZ8.5 (GLOVE) ×4 IMPLANT
GLOVE BIOGEL M 7.0 STRL (GLOVE) IMPLANT
GLOVE BIOGEL PI IND STRL 6.5 (GLOVE) ×1 IMPLANT
GLOVE BIOGEL PI IND STRL 7.5 (GLOVE) IMPLANT
GLOVE BIOGEL PI IND STRL 8.5 (GLOVE) ×1 IMPLANT
GLOVE BIOGEL PI INDICATOR 6.5 (GLOVE) ×1
GLOVE BIOGEL PI INDICATOR 7.5 (GLOVE)
GLOVE BIOGEL PI INDICATOR 8.5 (GLOVE) ×1
GOWN SPEC L3 XXLG W/TWL (GOWN DISPOSABLE) ×4 IMPLANT
GOWN STRL REUS W/TWL LRG LVL3 (GOWN DISPOSABLE) ×2 IMPLANT
GUIDEPIN 3.2X17.5 THRD DISP (PIN) ×2 IMPLANT
GUIDEWIRE BEAD TIP (WIRE) ×2 IMPLANT
HANDPIECE INTERPULSE COAX TIP (DISPOSABLE) ×1
KIT BASIN OR (CUSTOM PROCEDURE TRAY) ×2 IMPLANT
MANIFOLD NEPTUNE II (INSTRUMENTS) ×2 IMPLANT
NAIL FEM RETRO 10.5X360 (Nail) ×2 IMPLANT
NS IRRIG 1000ML POUR BTL (IV SOLUTION) ×2 IMPLANT
PACK TOTAL JOINT (CUSTOM PROCEDURE TRAY) ×2 IMPLANT
PADDING CAST COTTON 6X4 STRL (CAST SUPPLIES) ×2 IMPLANT
POSITIONER SURGICAL ARM (MISCELLANEOUS) ×2 IMPLANT
SCREW CORT TI DBL LEAD 5X30 (Screw) ×2 IMPLANT
SCREW CORT TI DBL LEAD 5X48 (Screw) ×2 IMPLANT
SCREW CORT TI DBL LEAD 5X56 (Screw) ×2 IMPLANT
SCREW CORT TI DBL LEAD 5X65 (Screw) ×2 IMPLANT
SCREW CORT TI DBL LEAD 5X70 (Screw) ×2 IMPLANT
SCREW CORT TI DBL LEAD 5X80 (Screw) IMPLANT
SET HNDPC FAN SPRY TIP SCT (DISPOSABLE) ×1 IMPLANT
STAPLER VISISTAT 35W (STAPLE) ×2 IMPLANT
STRIP CLOSURE SKIN 1/2X4 (GAUZE/BANDAGES/DRESSINGS) ×2 IMPLANT
SUT MNCRL AB 4-0 PS2 18 (SUTURE) ×2 IMPLANT
SUT MON AB 2-0 CT1 36 (SUTURE) ×2 IMPLANT
SUT VIC AB 1 CT1 36 (SUTURE) ×4 IMPLANT
SUT VIC AB 1 CTX 36 (SUTURE) ×2
SUT VIC AB 1 CTX36XBRD ANBCTR (SUTURE) ×2 IMPLANT
SUT VIC AB 2-0 CT1 27 (SUTURE) ×2
SUT VIC AB 2-0 CT1 TAPERPNT 27 (SUTURE) ×2 IMPLANT
TOWEL OR 17X26 10 PK STRL BLUE (TOWEL DISPOSABLE) ×4 IMPLANT
WATER STERILE IRR 1000ML POUR (IV SOLUTION) ×2 IMPLANT
WRAP KNEE MAXI GEL POST OP (GAUZE/BANDAGES/DRESSINGS) ×2 IMPLANT

## 2018-05-03 NOTE — Anesthesia Preprocedure Evaluation (Addendum)
Anesthesia Evaluation  Patient identified by MRN, date of birth, ID band Patient awake    Reviewed: Allergy & Precautions, NPO status , Patient's Chart, lab work & pertinent test results  History of Anesthesia Complications Negative for: history of anesthetic complications  Airway Mallampati: I  TM Distance: >3 FB Neck ROM: Full    Dental  (+) Caps, Dental Advisory Given   Pulmonary neg pulmonary ROS,    breath sounds clear to auscultation       Cardiovascular hypertension, Pt. on medications (-) angina+ DVT   Rhythm:Regular Rate:Normal  05/03/18 ECHO: EF 60-65%, mild MR   Neuro/Psych Depression Possible syncope: cardiac and neuro w/up ok    GI/Hepatic negative GI ROS, (+)     substance abuse (7 years ago)  alcohol use,   Endo/Other  negative endocrine ROS  Renal/GU negative Renal ROS     Musculoskeletal  (+) Arthritis , Osteoarthritis,    Abdominal   Peds  Hematology negative hematology ROS (+)   Anesthesia Other Findings H/o breast cancer  Reproductive/Obstetrics                            Anesthesia Physical Anesthesia Plan  ASA: III  Anesthesia Plan: Spinal   Post-op Pain Management:    Induction:   PONV Risk Score and Plan: 2 and Ondansetron, Dexamethasone and Treatment may vary due to age or medical condition  Airway Management Planned: Natural Airway and Simple Face Mask  Additional Equipment:   Intra-op Plan:   Post-operative Plan:   Informed Consent: I have reviewed the patients History and Physical, chart, labs and discussed the procedure including the risks, benefits and alternatives for the proposed anesthesia with the patient or authorized representative who has indicated his/her understanding and acceptance.   Dental advisory given  Plan Discussed with: CRNA and Surgeon  Anesthesia Plan Comments: (Plan routine monitors, SAB)        Anesthesia  Quick Evaluation
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
## 2018-05-03 NOTE — Discharge Instructions (Signed)
Dr. Rod Can Adult Hip & Knee Specialist [facility] [address] [place], [place] [address] [phone]   POSTOPERATIVE DIRECTIONS    Hip Rehabilitation, Guidelines Following Surgery   WEIGHT BEARING Partial weight bearing with assist device as directed.  touch down weight bearing right lower extremity   HOME CARE INSTRUCTIONS  Remove items at home which could result in a fall. This includes throw rugs or furniture in walking pathways.  Continue medications as instructed at time of discharge.  You may have some home medications which will be placed on hold until you complete the course of blood thinner medication.  4 days after discharge, you may start showering. No tub baths or soaking your incisions. Do not put on socks or shoes without following the instructions of your caregivers.   Sit on chairs with arms. Use the chair arms to help push yourself up when arising.  Arrange for the use of a toilet seat elevator so you are not sitting low.   Walk with walker as instructed.  You may resume a sexual relationship in one month or when given the OK by your caregiver.  Use walker as long as suggested by your caregivers.  Avoid periods of inactivity such as sitting longer than an hour when not asleep. This helps prevent blood clots.  You may return to work once you are cleared by Engineer, production.  Do not drive a car for 6 weeks or until released by your surgeon.  Do not drive while taking narcotics.  Wear elastic stockings for two weeks following surgery during the day but you may remove then at night.  Make sure you keep all of your appointments after your operation with all of your doctors and caregivers. You should call the office at the above phone number and make an appointment for approximately two weeks after the date of your surgery. Please pick up a stool softener and laxative for home use as long as you are requiring pain  medications.  ICE to the affected hip every three hours for 30 minutes at a time and then as needed for pain and swelling. Continue to use ice on the hip for pain and swelling from surgery. You may notice swelling that will progress down to the foot and ankle.  This is normal after surgery.  Elevate the leg when you are not up walking on it.   It is important for you to complete the blood thinner medication as prescribed by your doctor.  Continue to use the breathing machine which will help keep your temperature down.  It is common for your temperature to cycle up and down following surgery, especially at night when you are not up moving around and exerting yourself.  The breathing machine keeps your lungs expanded and your temperature down.  RANGE OF MOTION AND STRENGTHENING EXERCISES  These exercises are designed to help you keep full movement of your hip joint. Follow your caregiver's or physical therapist's instructions. Perform all exercises about fifteen times, three times per day or as directed. Exercise both hips, even if you have had only one joint replacement. These exercises can be done on a training (exercise) mat, on the floor, on a table or on a bed. Use whatever works the best and is most comfortable for you. Use music or television while you are exercising so that the exercises are a pleasant break in your day. This will make your life better with the exercises acting as a break  in routine you can look forward to.  Lying on your back, slowly slide your foot toward your buttocks, raising your knee up off the floor. Then slowly slide your foot back down until your leg is straight again.  Lying on your back spread your legs as far apart as you can without causing discomfort.  Lying on your side, raise your upper leg and foot straight up from the floor as far as is comfortable. Slowly lower the leg and repeat.  Lying on your back, tighten up the muscle in the front of your thigh (quadriceps  muscles). You can do this by keeping your leg straight and trying to raise your heel off the floor. This helps strengthen the largest muscle supporting your knee.  Lying on your back, tighten up the muscles of your buttocks both with the legs straight and with the knee bent at a comfortable angle while keeping your heel on the floor.   SKILLED REHAB INSTRUCTIONS: If the patient is transferred to a skilled rehab facility following release from the hospital, a list of the current medications will be sent to the facility for the patient to continue.  When discharged from the skilled rehab facility, please have the facility set up the patient's [place] prior to being released. Also, the skilled facility will be responsible for providing the patient with their medications at time of release from the facility to include their pain medication and their blood thinner medication. If the patient is still at the rehab facility at time of the two week follow up appointment, the skilled rehab facility will also need to assist the patient in arranging follow up appointment in our office and any transportation needs.  MAKE SURE YOU:  Understand these instructions.  Will watch your condition.  Will get help right away if you are not doing well or get worse.  Pick up stool softner and laxative for home use following surgery while on pain medications. Daily dry dressing changes as needed. In 4 days, you may remove your dressings and begin taking showers - no tub baths or soaking the incisions. Continue to use ice for pain and swelling after surgery. Do not use any lotions or creams on the incision until instructed by your surgeon.

## 2018-05-03 NOTE — Interval H&P Note (Signed)
History and Physical Interval Note:  05/03/2018 2:50 PM  [name]  has presented today for surgery, with the diagnosis of right periprosthetic femur fracture  The various methods of treatment have been discussed with the patient and family. After consideration of risks, benefits and other options for treatment, the patient has consented to  Procedure(s): OPEN REDUCTION INTERNAL FIXATION (ORIF) RIGHT PERIPROSTHETIC FRACTURE (Right) as a surgical intervention .  The patient's history has been reviewed, patient examined, no change in status, stable for surgery.  I have reviewed the patient's chart and labs.  Questions were answered to the patient's satisfaction.    The risks, benefits, and alternatives were discussed with the patient. There are risks associated with the surgery including, but not limited to, problems with anesthesia (death), infection, differences in leg length/angulation/rotation, fracture of bones, loosening or failure of implants, malunion, nonunion, hematoma (blood accumulation) which may require surgical drainage, blood clots, pulmonary embolism, nerve injury (foot drop), and blood vessel injury. The patient understands these risks and elects to proceed.    Hilton Cork Allene Furuya

## 2018-05-03 NOTE — Anesthesia Postprocedure Evaluation (Signed)
Anesthesia Post Note  Patient: ERNESTA TRABERT  Procedure(s) Performed: RIGHT RETROGRADE FEMORAL NAIL (Right Leg Upper)     Patient location during evaluation: PACU Anesthesia Type: Spinal Level of consciousness: awake and alert, oriented and patient cooperative (conversant) Pain management: pain level controlled Vital Signs Assessment: post-procedure vital signs reviewed and stable Respiratory status: spontaneous breathing, nonlabored ventilation, respiratory function stable and patient connected to nasal cannula oxygen Cardiovascular status: blood pressure returned to baseline and stable Postop Assessment: spinal receding, patient able to bend at knees and no apparent nausea or vomiting Anesthetic complications: no    Last Vitals:  Vitals:   05/03/18 1900 05/03/18 1915  BP: 120/64 129/68  Pulse: 73 75  Resp: 16 12  Temp:    SpO2: 96% 93%    Last Pain:  Vitals:   05/03/18 1900  TempSrc:   PainSc: Asleep                 Leonardo Plaia,E. Tisa Weisel

## 2018-05-03 NOTE — Progress Notes (Signed)
PROGRESS NOTE    [name]  ZDG:[id number] DOB: 1932/03/01 DOA: 05/02/2018 PCP: Burnard Bunting, MD  Brief Narrative: 84 y.o. female with medical history significant of orthostatic hypotension, hyperlipidemia and lower extremity edema due to venous insufficiency, Left Breast Cancer 20 years ago     Presented with a fall she was trying to saw a pair of pants. No head injury.   Resulting in rotation of right leg no LOC she is not sure how she fell she had to crawl on the floor for an hour in order to get to the phone to call EMS not on any blood thinners EMS arrived administered 100 mcg of fentanyl.  She denies shortness of breath or chest pain she goes to the Gym twice no exertional chest pain. Able to walk up a flight of stairs.     Regarding pertinent Chronic problems: Orthostatic hypotension and leg edema due to venous stasis followed by cardiology wears compression stockings   hx of Right artificial knee had a total knee replacement 3 years ago     March 2016, her echocardiogram showed normal findings with a session of moderate left atrial enlargement and mild diastolic left ventricular dysfunction.   Assessment & Plan:   Active Problems:   Essential hypertension, benign   Orthostatic hypotension   Diastolic dysfunction without heart failure   Femur fracture, right (HCC)   Prolonged QT interval   Bigeminy  1]RIGHT FEMUR FRACTURE S/P FALL -FOR OR TODAY.appreciate cardiology in put.await echo.  If no significant changes in the echo that was done today plan to proceed with OR today.  Patient is unsure of how she fell.  She denied any chest pain lightheadedness or dizziness.  She denies syncope.  She denies tripping or falling over something.  However she reports she was emotionally stressed out with thinking about a family member.  She had frequent PVCs.  K was 3.3 which is being repleted.  She received a dose of magnesium today.  2] history of hypertension with  orthostatic hypotension better after decreasing the dose of Maxide.  3] QT prolongation avoid any medications that can increase QT interval, replace electrolytes and follow-up levels.  EKG in the morning. DVT prophylaxis: SCD Code Status: Full code Family Communication: None available in the room patient lives at home with family. Disposition Plan: Pending clinical improvement  Consultants: Ortho and cardiology  Procedures: None Antimicrobials: None  Subjective: Patient resting in bed awake and alert reports the pain is better after receiving  narcotics.   Objective: Vitals:   05/02/18 2245 05/03/18 0000 05/03/18 0248 05/03/18 0451  BP: (!) 112/55  122/66 (!) 123/59  Pulse: (!) 37  70 69  Resp:   18 18  Temp:   98.3 F (36.8 C) 98 F (36.7 C)  TempSrc:   Oral Oral  SpO2: 93%  97% 95%  Weight:  74 kg    Height:  5' 6.5" (1.689 m)      Intake/Output Summary (Last 24 hours) at 05/03/2018 1016 Last data filed at 05/03/2018 0900 Gross per 24 hour  Intake 1680.58 ml  Output -  Net 1680.58 ml   Filed Weights   05/02/18 1752 05/03/18 0000  Weight: 70.3 kg 74 kg    Examination:oral  Mucosa dry  General exam: Appears calm and comfortable  Respiratory system: Clear to auscultation. Respiratory effort normal. Cardiovascular system: S1 & S2 heard, RRR. No JVD, murmurs, rubs, gallops or clicks. No pedal edema. Gastrointestinal system: Abdomen is nondistended, soft  and nontender. No organomegaly or masses felt. Normal bowel sounds heard. Central nervous system: Alert and oriented. No focal neurological deficits. Extremities: Symmetric 5 x 5 power. Skin: No rashes, lesions or ulcers Psychiatry: Judgement and insight appear normal. Mood & affect appropriate.     Data Reviewed: I have personally reviewed following labs and imaging studies  CBC: Recent Labs  Lab 05/02/18 1808 05/03/18 0600  WBC 8.2 7.5  NEUTROABS 6.0  --   HGB 13.4 12.9  HCT 41.5 41.8  MCV 97.4 101.7*    PLT 192 628   Basic Metabolic Panel: Recent Labs  Lab 05/02/18 1808 05/02/18 2246 05/03/18 0600  NA 142  --  139  K 3.6  --  3.3*  CL 102  --  103  CO2 27  --  26  GLUCOSE 100*  --  91  BUN 33*  --  22  CREATININE 0.85  --  0.64  CALCIUM 9.7  --  9.0  MG  --  1.7  --   PHOS  --  4.0  --    GFR: Estimated Creatinine Clearance: 55.4 mL/min (by C-G formula based on SCr of 0.64 mg/dL). Liver Function Tests: Recent Labs  Lab 05/03/18 0600  ALBUMIN 3.8   No results for input(s): LIPASE, AMYLASE in the last 168 hours. No results for input(s): AMMONIA in the last 168 hours. Coagulation Profile: Recent Labs  Lab 05/02/18 1808  INR 0.88   Cardiac Enzymes: Recent Labs  Lab 05/02/18 2246 05/03/18 0600  TROPONINI <0.03 <0.03   BNP (last 3 results) No results for input(s): PROBNP in the last 8760 hours. HbA1C: No results for input(s): HGBA1C in the last 72 hours. CBG: No results for input(s): GLUCAP in the last 168 hours. Lipid Profile: No results for input(s): CHOL, HDL, LDLCALC, TRIG, CHOLHDL, LDLDIRECT in the last 72 hours. Thyroid Function Tests: Recent Labs    05/02/18 2246  TSH 1.753   Anemia Panel: No results for input(s): VITAMINB12, FOLATE, FERRITIN, TIBC, IRON, RETICCTPCT in the last 72 hours. Sepsis Labs: No results for input(s): PROCALCITON, LATICACIDVEN in the last 168 hours.  Recent Results (from the past 240 hour(s))  Surgical PCR screen     Status: Abnormal   Collection Time: 05/03/18 12:29 AM  Result Value Ref Range Status   MRSA, PCR NEGATIVE NEGATIVE Final   Staphylococcus aureus POSITIVE (A) NEGATIVE Final    Comment: (NOTE) The Xpert SA Assay (FDA approved for NASAL specimens in patients 33 years of age and older), is one component of a comprehensive surveillance program. It is not intended to diagnose infection nor to guide or monitor treatment. Performed at [facility], [place] [address] [place], [place] [address]           Radiology Studies: Dg Chest 1 View  Result Date: 05/02/2018 CLINICAL DATA:  Status post fall with pain EXAM: CHEST  1 VIEW COMPARISON:  October 18, 2015 FINDINGS: The heart size and mediastinal contours are stable. The heart size is enlarged. Both lungs are clear. There is no pneumothorax. Chronic deformities of several lateral left ribs are identified. Surgical clips are noted in the left axilla. IMPRESSION: No acute cardiopulmonary disease identified. Electronically Signed   By: Abelardo Diesel M.D.   On: 05/02/2018 19:50   Dg Pelvis 1-2 Views  Result Date: 05/02/2018 CLINICAL DATA:  Status post fall with pelvic pain. EXAM: PELVIS - 1-2 VIEW COMPARISON:  None. FINDINGS: There is no evidence of pelvic fracture or dislocation.  Degenerative joint changes of bilateral hips with narrowed joint space and osteophyte formation are noted. IMPRESSION: No acute fracture or dislocation. Electronically Signed   By: Abelardo Diesel M.D.   On: 05/02/2018 19:50   Dg Femur, Min 2 Views Right  Result Date: 05/02/2018 CLINICAL DATA:  Status post fall with right femur pain. EXAM: RIGHT FEMUR 2 VIEWS COMPARISON:  None. FINDINGS: There is comminuted displaced fracture of the distal right femoral shaft above the knee replacement. There is no dislocation. IMPRESSION: Fracture of distal right femoral shaft. Electronically Signed   By: Abelardo Diesel M.D.   On: 05/02/2018 19:51        Scheduled Meds: . chlorhexidine  60 mL Topical Once  . escitalopram  20 mg Oral Daily  . mupirocin ointment  1 application Nasal BID  . potassium chloride  40 mEq Oral Once  . simvastatin  20 mg Oral Daily   Continuous Infusions: . sodium chloride 125 mL/hr at 05/03/18 0600  .  ceFAZolin (ANCEF) IV    . methocarbamol (ROBAXIN) IV    . tranexamic acid       LOS: 1 day     Georgette Shell, MD [facility]  If 7PM-7AM, please contact night-coverage [email or web address] Password [email or web address] 05/03/2018, 10:16 AM

## 2018-05-03 NOTE — Anesthesia Procedure Notes (Signed)
Procedure Name: MAC Date/Time: 05/03/2018 3:45 PM Performed by: West Pugh, CRNA Pre-anesthesia Checklist: Patient identified, Emergency Drugs available, Suction available, Patient being monitored and Timeout performed Patient Re-evaluated:Patient Re-evaluated prior to induction Oxygen Delivery Method: Simple face mask Preoxygenation: Pre-oxygenation with 100% oxygen Induction Type: IV induction Number of attempts: 1 Placement Confirmation: positive ETCO2 Dental Injury: Teeth and Oropharynx as per pre-operative assessment

## 2018-05-03 NOTE — Progress Notes (Signed)
  Echocardiogram 2D Echocardiogram has been performed.  Darlina Sicilian M 05/03/2018, 8:32 AM

## 2018-05-03 NOTE — Anesthesia Procedure Notes (Signed)
Spinal  Patient location during procedure: OR End time: 05/03/2018 4:01 PM Staffing Anesthesiologist: Annye Asa, MD Performed: anesthesiologist  Preanesthetic Checklist Completed: patient identified, site marked, surgical consent, pre-op evaluation, timeout performed, IV checked, risks and benefits discussed and monitors and equipment checked Spinal Block Patient position: right lateral decubitus Prep: site prepped and draped and DuraPrep Patient monitoring: blood pressure, continuous pulse ox, cardiac monitor and heart rate Approach: midline Location: L3-4 Injection technique: single-shot Needle Needle type: Quincke  Needle gauge: 22 G Needle length: 9 cm Additional Notes Pt identified in Operating room.  Monitors applied. Working IV access confirmed. RLD position. Sterile prep, drape lumbar spine.  1% lido local L 3,4.  #22ga Quincke into clear CSF L 3,4.  12mg  0.75% Bupivacaine with dextrose injected with asp CSF beginning and end of injection.  Patient asymptomatic, VSS, no heme aspirated, tolerated well.  Jenita Seashore, MD

## 2018-05-03 NOTE — Transfer of Care (Signed)
Immediate Anesthesia Transfer of Care Note  Patient: [name]  Procedure(s) Performed: RIGHT RETROGRADE FEMORAL NAIL (Right Leg Upper)  Patient Location: PACU  Anesthesia Type:MAC and Spinal  Level of Consciousness: awake, alert , oriented and patient cooperative  Airway & Oxygen Therapy: Patient Spontanous Breathing and Patient connected to face mask oxygen  Post-op Assessment: Report given to RN and Post -op Vital signs reviewed and stable  Post vital signs: Reviewed and stable  Last Vitals:  Vitals Value Taken Time  BP 114/52 05/03/2018  6:54 PM  Temp    Pulse 74 05/03/2018  6:58 PM  Resp 16 05/03/2018  6:58 PM  SpO2 96 % 05/03/2018  6:58 PM  Vitals shown include unvalidated device data.  Last Pain:  Vitals:   05/03/18 1450  TempSrc:   PainSc: 4       Patients Stated Pain Goal: 4 (20/94/70 9628)  Complications: No apparent anesthesia complications

## 2018-05-03 NOTE — H&P (Signed)
Patient ID: [name] MRN: [id number] DOB/AGE: [birth year] 84 y.o.  Admit date: 05/02/2018  Admission Diagnoses:  Active Problems:   Essential hypertension, benign   Orthostatic hypotension   Diastolic dysfunction without heart failure   Femur fracture, right (HCC)   Prolonged QT interval   Bigeminy   HPI: Very pleasant 84 year old female pt presented to Ed last evening after at fall in her home.  She said she was in the laundry room and does not know how it happened.  The pt resides with her husband but no one was home when this happened.  She said she tried to reach a phone in the kitchen and pulled it out of the wall and then had to pull herself to another location in the home to get her cell phone at of her purse.  She describes it as an "awful" experience.  The pt was brought by ambulance to the ED.  The pt has a past med hx of orthostatic hypotension, hyperlipidemia, LE edema because of venous insufficiency and breast cancer 20 years ago.  The pt is not on any blood thinners.  The pt had a knee replacement with Dr. Elmyra Ricks 3 years ago.  Past Medical History: Past Medical History:  Diagnosis Date  . Arthritis    "in my back"  . Breast cancer ([place]) 1996   left breast cancer   . Complication of anesthesia 04/18/2012   "didn't tolerate it today very well; had the shakes and very hard time w/it"  . Depression 11/20/2012  . DVT (deep venous thrombosis) ([place])   . High cholesterol   . History of alcoholism ([place])    7 1/2 years clean  . Hypertension   . Osteoporosis   . Peripheral vascular disease (HCC)    hx of ligation   . Personal history of radiation therapy 1993   Left Breast Cancer  . Spinal stenosis     Surgical History: Past Surgical History:  Procedure Laterality Date  . BREAST LUMPECTOMY Left 1996  . I&D EXTREMITY  04/17/2012   Procedure: IRRIGATION AND DEBRIDEMENT EXTREMITY;  Surgeon: Rudean Haskell, MD;  Location: [facility];  Service: Orthopedics;   Laterality: Right;  . KNEE ARTHROSCOPY  04/17/2012   Procedure: ARTHROSCOPY KNEE;  Surgeon: Rudean Haskell, MD;  Location: [facility];  Service: Orthopedics;  Laterality: Right;  . KYPHOPLASTY  2009, 2013   thorasic, lumbar  . MENISECTOMY Right 04/11/2012  . POSTERIOR LAMINECTOMY / DECOMPRESSION LUMBAR SPINE  1980's  . rotator cuff surgery  Right   . TONSILLECTOMY AND ADENOIDECTOMY     "I was a child"  . TOTAL KNEE ARTHROPLASTY Right 10/29/2012   Procedure: RIGHT TOTAL KNEE ARTHROPLASTY;  Surgeon: Gearlean Alf, MD;  Location: [facility];  Service: Orthopedics;  Laterality: Right;  . VEIN LIGATION      Family History: Family History  Problem Relation Age of Onset  . Kidney failure Mother   . Anuerysm Father        AAA    Social History: Social History   Socioeconomic History  . Marital status: Married    Spouse name: Not on file  . Number of children: 2  . Years of education: Not on file  . Highest education level: Not on file  Occupational History  . Occupation: retired  Scientific laboratory technician  . Financial resource strain: Not on file  . Food insecurity:    Worry: Not on file    Inability: Not on  file  . Transportation needs:    Medical: Not on file    Non-medical: Not on file  Tobacco Use  . Smoking status: Never Smoker  . Smokeless tobacco: Never Used  Substance and Sexual Activity  . Alcohol use: No  . Drug use: No  . Sexual activity: Not on file  Lifestyle  . Physical activity:    Days per week: Not on file    Minutes per session: Not on file  . Stress: Not on file  Relationships  . Social connections:    Talks on phone: Not on file    Gets together: Not on file    Attends religious service: Not on file    Active member of club or organization: Not on file    Attends meetings of clubs or organizations: Not on file    Relationship status: Not on file  . Intimate partner violence:    Fear of current or ex partner: Not on file    Emotionally abused: Not on file     Physically abused: Not on file    Forced sexual activity: Not on file  Other Topics Concern  . Not on file  Social History Narrative  . Not on file    Allergies: Oysters [shellfish allergy] and Codeine  Medications: I have reviewed the patient's current medications.  Vital Signs: Patient Vitals for the past 24 hrs:  BP Temp Temp src Pulse Resp SpO2 Height Weight  05/03/18 0451 (!) 123/59 98 F (36.7 C) Oral 69 18 95 % - -  05/03/18 0248 122/66 98.3 F (36.8 C) Oral 70 18 97 % - -  05/03/18 0000 - - - - - - 5' 6.5" (1.689 m) 74 kg  05/02/18 2245 (!) 112/55 - - (!) 37 - 93 % - -  05/02/18 2200 - - - 65 20 (!) 88 % - -  05/02/18 2115 (!) 114/57 - - 67 18 96 % - -  05/02/18 2030 (!) 106/46 - - (!) 37 18 95 % - -  05/02/18 1945 106/60 - - 74 16 91 % - -  05/02/18 1847 113/67 - - 77 19 95 % - -  05/02/18 1752 - - - - - - 5' 6.5" (1.689 m) 70.3 kg  05/02/18 1744 - (!) 97.5 F (36.4 C) Oral - - - - -  05/02/18 1742 136/71 - - 79 18 95 % - -    Radiology: Dg Chest 1 View  Result Date: 05/02/2018 CLINICAL DATA:  Status post fall with pain EXAM: CHEST  1 VIEW COMPARISON:  October 18, 2015 FINDINGS: The heart size and mediastinal contours are stable. The heart size is enlarged. Both lungs are clear. There is no pneumothorax. Chronic deformities of several lateral left ribs are identified. Surgical clips are noted in the left axilla. IMPRESSION: No acute cardiopulmonary disease identified. Electronically Signed   By: Abelardo Diesel M.D.   On: 05/02/2018 19:50   Dg Pelvis 1-2 Views  Result Date: 05/02/2018 CLINICAL DATA:  Status post fall with pelvic pain. EXAM: PELVIS - 1-2 VIEW COMPARISON:  None. FINDINGS: There is no evidence of pelvic fracture or dislocation. Degenerative joint changes of bilateral hips with narrowed joint space and osteophyte formation are noted. IMPRESSION: No acute fracture or dislocation. Electronically Signed   By: Abelardo Diesel M.D.   On: 05/02/2018 19:50   Dg  Femur, Min 2 Views Right  Result Date: 05/02/2018 CLINICAL DATA:  Status post fall with right femur pain. EXAM:  RIGHT FEMUR 2 VIEWS COMPARISON:  None. FINDINGS: There is comminuted displaced fracture of the distal right femoral shaft above the knee replacement. There is no dislocation. IMPRESSION: Fracture of distal right femoral shaft. Electronically Signed   By: Abelardo Diesel M.D.   On: 05/02/2018 19:51    Labs: Recent Labs    05/02/18 1808 05/03/18 0600  WBC 8.2 7.5  RBC 4.26 4.11  HCT 41.5 41.8  PLT 192 177   Recent Labs    05/02/18 1808 05/03/18 0600  NA 142 139  K 3.6 3.3*  CL 102 103  CO2 27 26  BUN 33* 22  CREATININE 0.85 0.64  GLUCOSE 100* 91  CALCIUM 9.7 9.0   Recent Labs    05/02/18 1808  INR 0.88    Review of Systems: ROS  Physical Exam: Body mass index is 25.94 kg/m.  Physical Exam  Constitutional: She is oriented to person, place, and time. She appears well-developed and well-nourished.  HENT:  Head: Normocephalic.  GI: Soft.  Neurological: She is alert and oriented to person, place, and time.  Skin: Skin is warm and dry.  Psychiatric: She has a normal mood and affect. Her behavior is normal. Judgment and thought content normal.   TTP of R hip  RLE has a brace placed Pt can move b/l feet Good sensation present b/l  Assessment and Plan: Right femur fracture noted above knee replacement on imaging Dr. Lyla Glassing will do surgery this afternoon Continue NPO May increase Norco to q4 if needed Nurse has already done consent   Ronette Deter, [place] for [facility], MD [facility] [phone]

## 2018-05-03 NOTE — Care Management Note (Signed)
Case Management Note  Patient Details  Name: [name] MRN: [id number] Date of Birth: 02-Nov-1931  Subjective/Objective: Admitted w/fall. Hx: L breast Ca,thn,CHF.R hip fx-ortho cons-R hip surgery today. Await post surgery & PT cons.CM/CSW following.                   Action/Plan:d/c SNF.   Expected Discharge Date:  (unknown)               Expected Discharge Plan:  Skilled Nursing Facility  In-House Referral:  Clinical Social Work  Discharge planning Services  CM Consult  Post Acute Care Choice:    Choice offered to:     DME Arranged:    DME Agency:     HH Arranged:    [place] Agency:     Status of Service:  In process, will continue to follow  If discussed at Long Length of Stay Meetings, dates discussed:    Additional Comments:  Dessa Phi, RN 05/03/2018, 11:05 AM

## 2018-05-03 NOTE — Op Note (Signed)
OPERATIVE REPORT   05/03/2018  6:17 PM  PATIENT:  [name]   SURGEON:  Bertram Savin, MD  ASSISTANT:  Sherlean Foot, RNFA.   PREOPERATIVE DIAGNOSIS: Right periprosthetic distal femur fracture.  POSTOPERATIVE DIAGNOSIS:  Same.  PROCEDURE: Retrograde intramedullary nail fixation, right femur.  ANESTHESIA:   Spinal.  ANTIBIOTICS: 2 g Ancef.  ESTIMATED BLOOD LOSS: 150 cc.  IMPLANTS: Biomet [place] retrograde nail, size 10.5 x 360 mm. 5 mm distal interlocking screw x4. 5 mm proximal interlocking screw x1.  SPECIMENS:  None.  COMPLICATIONS:  None.  DISPOSITION:  Stable to PACU.  SURGICAL INDICATIONS:  [name] is a 84 y.o. female with a history of a right total knee replacement done by Dr. Gaynelle Arabian in 2014.  The knee had been doing well until yesterday, when she tripped and fell.  She had right lower extremity pain and inability to weight-bear.  She was brought to the emergency department at [facility], where x-rays revealed a comminuted periprosthetic distal femur fracture proximal to the anterior flange.  She was admitted to the hospitalist service for perioperative risk stratification and medical optimization.  The risks, benefits, and alternatives to surgical fixation were explained, and she elected to proceed.  The risks, benefits, and alternatives were discussed with the patient preoperatively including but not limited to the risks of infection, bleeding, nerve / blood vessel injury, malunion, nonunion, cardiopulmonary complications, the need for repeat surgery, among others, and the patient was willing to proceed.  PROCEDURE IN DETAIL: The patient was identified in the holding area using 2 identifiers.  The surgical site was marked by myself.  She was taken to the operating room, and spinal anesthesia was obtained.  Foley catheter was placed.  She was placed supine on a radiolucent operating table.  All bony prominences were well-padded.  A bump  was placed under the right hip.  The right lower extremity was prepped and draped in the normal sterile surgical fashion.  Timeout was called, verifying site and site of surgery.  Patient did receive IV antibiotics within 60 minutes of beginning the procedure.  The lower extremity was positioned on a radiolucent triangle.  The fracture was reduced with traction and rotation. I used a 10 blade to excise her previous anterior knee incision.  Full-thickness skin flaps were created.  I made a limited medial parapatellar arthrotomy.  I removed the plug located within the notch of the femoral component.  A guidepin was inserted under live AP and lateral fluoroscopic control.  I used the entry reamer.  A guide wire was placed to the level of the lesser trochanter.  I sequentially reamed up to a 12 mm reamer.  The length of the nail was measured using lateral fluoroscopy.  The real nail was opened and placed on the jig.  The nail was impacted without any difficulty.  The fracture itself was very comminuted.  Alignment was acceptable.  Through 2 separate stab incisions, I placed 2 transverse screws from lateral to medial.  2 oblique screws were placed using the anterior incision.  The sleeve was tightened, locking the screws in place.  The jig was removed.  The lower extremity was brought into full extension.  Using perfect circle technique, I placed a single proximal interlocking screw bicortically.  Final AP and lateral fluoroscopy views were obtained of the knee and of the proximal end of the nail.  Again, fracture alignment was acceptable.  The wounds were copiously irrigated with saline using  pulse lavage.  The arthrotomy was closed with #1 Vicryl No. 1 strata fix.  Deep dermal layer was closed with 2-0 Monocryl and staples for the skin.  Dermabond was applied to the wounds.  Once the glue was fully hardened, Aquacel dressing was applied.  Sponge, needle, and instrument counts were correct at the end of the case x2.   There were no known complications.  Patient was then aroused from anesthesia and taken the PACU in stable condition.  POSTOPERATIVE PLAN: Postoperatively, the patient be readmitted to the hospitalist service.  She will be touchdown weightbearing right lower extremity with a walker.  Begin Lovenox for DVT prophylaxis.  She will work with physical therapy and undergo disposition planning.  She will return to the office in 2 weeks for routine postoperative care.

## 2018-05-04 ENCOUNTER — Encounter (HOSPITAL_COMMUNITY): Payer: Self-pay | Admitting: Orthopedic Surgery

## 2018-05-04 DIAGNOSIS — Z09 Encounter for follow-up examination after completed treatment for conditions other than malignant neoplasm: Secondary | ICD-10-CM

## 2018-05-04 DIAGNOSIS — S72451A Displaced supracondylar fracture without intracondylar extension of lower end of right femur, initial encounter for closed fracture: Secondary | ICD-10-CM | POA: Diagnosis present

## 2018-05-04 LAB — BASIC METABOLIC PANEL
Anion gap: 8 (ref 5–15)
BUN: 18 mg/dL (ref 8–23)
CALCIUM: 8.9 mg/dL (ref 8.9–10.3)
CO2: 30 mmol/L (ref 22–32)
CREATININE: 0.71 mg/dL (ref 0.44–1.00)
Chloride: 104 mmol/L (ref 98–111)
Glucose, Bld: 142 mg/dL — ABNORMAL HIGH (ref 70–99)
Potassium: 3.9 mmol/L (ref 3.5–5.1)
SODIUM: 142 mmol/L (ref 135–145)

## 2018-05-04 LAB — CBC
HEMATOCRIT: 37.7 % (ref 36.0–46.0)
Hemoglobin: 11.8 g/dL — ABNORMAL LOW (ref 12.0–15.0)
MCH: 31.3 pg (ref 26.0–34.0)
MCHC: 31.3 g/dL (ref 30.0–36.0)
MCV: 100 fL (ref 80.0–100.0)
PLATELETS: 174 10*3/uL (ref 150–400)
RBC: 3.77 MIL/uL — ABNORMAL LOW (ref 3.87–5.11)
RDW: 13 % (ref 11.5–15.5)
WBC: 7.8 10*3/uL (ref 4.0–10.5)
nRBC: 0 % (ref 0.0–0.2)

## 2018-05-04 LAB — MAGNESIUM: MAGNESIUM: 1.9 mg/dL (ref 1.7–2.4)

## 2018-05-04 LAB — VITAMIN D 25 HYDROXY (VIT D DEFICIENCY, FRACTURES): Vit D, 25-Hydroxy: 39.8 ng/mL (ref 30.0–100.0)

## 2018-05-04 MED ORDER — ACETAMINOPHEN 325 MG PO TABS: 325.0000 mg | ORAL_TABLET | Freq: Four times a day (QID) | ORAL | Status: DC | PRN

## 2018-05-04 MED ORDER — SENNA 8.6 MG PO TABS
1.0000 | ORAL_TABLET | Freq: Two times a day (BID) | ORAL | Status: DC
  Administered 2018-05-04 – 2018-05-08 (×9): 8.6 mg via ORAL
  Filled 2018-05-04 (×9): qty 1

## 2018-05-04 MED ORDER — ENOXAPARIN SODIUM 40 MG/0.4ML ~~LOC~~ SOLN: 40.0000 mg | SUBCUTANEOUS | 0 refills | Status: DC

## 2018-05-04 MED ORDER — PHENOL 1.4 % MT LIQD: 1.0000 | OROMUCOSAL | Status: DC | PRN

## 2018-05-04 MED ORDER — HYDROCODONE-ACETAMINOPHEN 5-325 MG PO TABS: 1.0000 | ORAL_TABLET | Freq: Four times a day (QID) | ORAL | 0 refills | Status: DC | PRN

## 2018-05-04 MED ORDER — DOCUSATE SODIUM 100 MG PO CAPS
100.0000 mg | ORAL_CAPSULE | Freq: Two times a day (BID) | ORAL | Status: DC
  Administered 2018-05-04 – 2018-05-08 (×9): 100 mg via ORAL
  Filled 2018-05-04 (×9): qty 1

## 2018-05-04 MED ORDER — MORPHINE SULFATE (PF) 2 MG/ML IV SOLN: 0.5000 mg | INTRAVENOUS | Status: DC | PRN

## 2018-05-04 MED ORDER — CEFAZOLIN SODIUM-DEXTROSE 2-4 GM/100ML-% IV SOLN
2.0000 g | Freq: Four times a day (QID) | INTRAVENOUS | Status: AC
  Administered 2018-05-04 (×2): 2 g via INTRAVENOUS
  Filled 2018-05-04 (×2): qty 100

## 2018-05-04 MED ORDER — HYDROCODONE-ACETAMINOPHEN 5-325 MG PO TABS
1.0000 | ORAL_TABLET | ORAL | Status: DC | PRN
  Administered 2018-05-04 (×3): 2 via ORAL
  Administered 2018-05-06: 1 via ORAL
  Administered 2018-05-07: 2 via ORAL
  Filled 2018-05-04 (×4): qty 2
  Filled 2018-05-04: qty 1

## 2018-05-04 MED ORDER — ENOXAPARIN SODIUM 40 MG/0.4ML ~~LOC~~ SOLN
40.0000 mg | SUBCUTANEOUS | Status: DC
  Administered 2018-05-04 – 2018-05-08 (×5): 40 mg via SUBCUTANEOUS
  Filled 2018-05-04 (×5): qty 0.4

## 2018-05-04 MED ORDER — BISACODYL 5 MG PO TBEC
10.0000 mg | DELAYED_RELEASE_TABLET | Freq: Once | ORAL | Status: AC
  Administered 2018-05-04: 10 mg via ORAL
  Filled 2018-05-04: qty 2

## 2018-05-04 MED ORDER — ONDANSETRON HCL 4 MG/2ML IJ SOLN
4.0000 mg | Freq: Four times a day (QID) | INTRAMUSCULAR | Status: DC | PRN
  Administered 2018-05-06: 4 mg via INTRAVENOUS
  Filled 2018-05-04: qty 2

## 2018-05-04 MED ORDER — HYDROCODONE-ACETAMINOPHEN 7.5-325 MG PO TABS
1.0000 | ORAL_TABLET | ORAL | Status: DC | PRN
  Administered 2018-05-04 – 2018-05-05 (×2): 2 via ORAL
  Administered 2018-05-05: 1 via ORAL
  Administered 2018-05-05: 2 via ORAL
  Administered 2018-05-05 – 2018-05-06 (×2): 1 via ORAL
  Filled 2018-05-04: qty 1
  Filled 2018-05-04 (×2): qty 2
  Filled 2018-05-04 (×2): qty 1
  Filled 2018-05-04: qty 2

## 2018-05-04 MED ORDER — ONDANSETRON HCL 4 MG PO TABS: 4.0000 mg | ORAL_TABLET | Freq: Four times a day (QID) | ORAL | Status: DC | PRN

## 2018-05-04 MED ORDER — BISACODYL 5 MG PO TBEC: 5.0000 mg | DELAYED_RELEASE_TABLET | Freq: Every day | ORAL | Status: DC | PRN

## 2018-05-04 MED ORDER — MENTHOL 3 MG MT LOZG: 1.0000 | LOZENGE | OROMUCOSAL | Status: DC | PRN

## 2018-05-04 NOTE — Progress Notes (Signed)
Progress Note  Patient Name: [name] Date of Encounter: 05/04/2018  Primary Cardiologist: Sanda Klein, MD   Subjective   Pt still with leg pain   No SOB   No CP    Inpatient Medications    Scheduled Meds: . escitalopram  20 mg Oral Daily  . mupirocin ointment  1 application Nasal BID  . potassium chloride  40 mEq Oral Once  . simvastatin  20 mg Oral Daily   Continuous Infusions: . sodium chloride Stopped (05/03/18 1446)  . methocarbamol (ROBAXIN) IV     PRN Meds: HYDROcodone-acetaminophen, methocarbamol **OR** methocarbamol (ROBAXIN) IV, morphine injection, morphine injection   Vital Signs    Vitals:   05/03/18 1915 05/03/18 1930 05/03/18 1943 05/04/18 0445  BP: 129/68 (!) 129/56 131/61 121/73  Pulse: 75 76 70 64  Resp: 12 15 12    Temp:  98 F (36.7 C) 97.8 F (36.6 C) 97.9 F (36.6 C)  TempSrc:    Oral  SpO2: 93% 93% 94% 98%  Weight:      Height:        Intake/Output Summary (Last 24 hours) at 05/04/2018 0648 Last data filed at 05/04/2018 0300 Gross per 24 hour  Intake 2595 ml  Output 3525 ml  Net -930 ml   Filed Weights   05/02/18 1752 05/03/18 0000 05/03/18 1450  Weight: 70.3 kg 74 kg 74 kg    Telemetry    SR with PACs- Personally Reviewed  ECG      Physical Exam   GEN: No acute distress.   Neck: No JVD Cardiac: RRR with occasional skips , no murmurs, rubs, or gallops.  Respiratory: Clear to auscultation bilaterally. GI: Soft, nontender, non-distended  MS: No edema; No deformity. Neuro:  Nonfocal  Psych: Normal affect   Labs    Chemistry Recent Labs  Lab 05/02/18 1808 05/03/18 0600  NA 142 139  K 3.6 3.3*  CL 102 103  CO2 27 26  GLUCOSE 100* 91  BUN 33* 22  CREATININE 0.85 0.64  CALCIUM 9.7 9.0  ALBUMIN  --  3.8  GFRNONAA >60 >60  GFRAA >60 >60  ANIONGAP 13 10     Hematology Recent Labs  Lab 05/02/18 1808 05/03/18 0600 05/04/18 0547  WBC 8.2 7.5 7.8  RBC 4.26 4.11 3.77*  HGB 13.4 12.9 11.8*  HCT  41.5 41.8 37.7  MCV 97.4 101.7* 100.0  MCH 31.5 31.4 31.3  MCHC 32.3 30.9 31.3  RDW 12.9 13.0 13.0  PLT 192 177 174    Cardiac Enzymes Recent Labs  Lab 05/02/18 2246 05/03/18 0600 05/03/18 0942  TROPONINI <0.03 <0.03 <0.03   No results for input(s): TROPIPOC in the last 168 hours.   BNPNo results for input(s): BNP, PROBNP in the last 168 hours.   DDimer No results for input(s): DDIMER in the last 168 hours.   Radiology    Dg Chest 1 View  Result Date: 05/02/2018 CLINICAL DATA:  Status post fall with pain EXAM: CHEST  1 VIEW COMPARISON:  October 18, 2015 FINDINGS: The heart size and mediastinal contours are stable. The heart size is enlarged. Both lungs are clear. There is no pneumothorax. Chronic deformities of several lateral left ribs are identified. Surgical clips are noted in the left axilla. IMPRESSION: No acute cardiopulmonary disease identified. Electronically Signed   By: Abelardo Diesel M.D.   On: 05/02/2018 19:50   Dg Pelvis 1-2 Views  Result Date: 05/02/2018 CLINICAL DATA:  Status post fall with pelvic pain.  EXAM: PELVIS - 1-2 VIEW COMPARISON:  None. FINDINGS: There is no evidence of pelvic fracture or dislocation. Degenerative joint changes of bilateral hips with narrowed joint space and osteophyte formation are noted. IMPRESSION: No acute fracture or dislocation. Electronically Signed   By: Abelardo Diesel M.D.   On: 05/02/2018 19:50   Dg C-arm 1-60 Min-no Report  Result Date: 05/03/2018 Fluoroscopy was utilized by the requesting physician.  No radiographic interpretation.   Dg Femur, Min 2 Views Right  Result Date: 05/02/2018 CLINICAL DATA:  Status post fall with right femur pain. EXAM: RIGHT FEMUR 2 VIEWS COMPARISON:  None. FINDINGS: There is comminuted displaced fracture of the distal right femoral shaft above the knee replacement. There is no dislocation. IMPRESSION: Fracture of distal right femoral shaft. Electronically Signed   By: Abelardo Diesel M.D.   On:  05/02/2018 19:51   Dg Femur Port, Min 2 Views Right  Result Date: 05/03/2018 CLINICAL DATA:  Status post retrograde right femoral nail - PACU images EXAM: RIGHT FEMUR PORTABLE 2 VIEW COMPARISON:  05/02/2018 FINDINGS: Status post ORIF of comminuted distal femur fracture with intramedullary nail. Remote knee arthroplasty. Expected postoperative gas and surgical clips are present. IMPRESSION: Status post ORIF of distal femur fracture. Electronically Signed   By: Nolon Nations M.D.   On: 05/03/2018 21:08    Cardiac Studies   Echo 05/03/18 ------------------------------------------------------------------- Study Conclusions  - Left ventricle: The cavity size was normal. There was mild focal   basal hypertrophy of the septum. Systolic function was normal.   The estimated ejection fraction was in the range of 60% to 65%.   Wall motion was normal; there were no regional wall motion   abnormalities. Doppler parameters are consistent with abnormal   left ventricular relaxation (grade 1 diastolic dysfunction). - Aortic valve: Mildly calcified annulus. Trileaflet; normal   thickness leaflets. There was trivial regurgitation. - Mitral valve: Calcified annulus. There was mild regurgitation. - Left atrium: The atrium was moderately dilated. - Pulmonary arteries: Systolic pressure was mildly increased. PA   peak pressure: 38 mm Hg (S).   Patient Profile      [name] is a 84 y.o. female with a history of orthostatic hypotension, hyperlipidemia, and chronic lower extremity edema secondary to venous insufficiency, who is being seen today for pre-operative clearance for a femur fracture at the request of Dr. Roel Cluck Assessment & Plan    Patient is Post op day #1   Doing well    Watch I/O   She is taking PO meds   Avoid IV fluids  2  Hx of orthostatic hypotension   BP OK in bed   Need to be careful when starts to ambulate  3  Ectopy   Pt had holter prior to admit taht showed PACs and  PVCs Continues to have   Asymptomatic     For questions or updates, please contact [facility] Please consult [email or web address] for contact info under        Signed, Dorris Carnes, MD  05/04/2018, 6:48 AM

## 2018-05-04 NOTE — Progress Notes (Signed)
    Subjective:  Patient reports pain as mild to moderate.  Denies N/V/CP/SOB. No c/o.  Objective:   VITALS:   Vitals:   05/03/18 1915 05/03/18 1930 05/03/18 1943 05/04/18 0445  BP: 129/68 (!) 129/56 131/61 121/73  Pulse: 75 76 70 64  Resp: 12 15 12    Temp:  98 F (36.7 C) 97.8 F (36.6 C) 97.9 F (36.6 C)  TempSrc:    Oral  SpO2: 93% 93% 94% 98%  Weight:      Height:        NAD ABD soft Sensation intact distally Intact pulses distally Dorsiflexion/Plantar flexion intact Incision: dressing C/D/I Compartment soft   Lab Results  Component Value Date   WBC 7.8 05/04/2018   HGB 11.8 (L) 05/04/2018   HCT 37.7 05/04/2018   MCV 100.0 05/04/2018   PLT 174 05/04/2018   BMET    Component Value Date/Time   NA 142 05/04/2018 0547   K 3.9 05/04/2018 0547   CL 104 05/04/2018 0547   CO2 30 05/04/2018 0547   GLUCOSE 142 (H) 05/04/2018 0547   BUN 18 05/04/2018 0547   CREATININE 0.71 05/04/2018 0547   CREATININE 0.75 06/13/2012 1057   CALCIUM 8.9 05/04/2018 0547   GFRNONAA >60 05/04/2018 0547   GFRNONAA 88 05/23/2012 1337   GFRAA >60 05/04/2018 0547   GFRAA >89 05/23/2012 1337     Assessment/Plan: 1 Day Post-Op   Active Problems:   Essential hypertension, benign   Orthostatic hypotension   Diastolic dysfunction without heart failure   Femur fracture, right (HCC)   Prolonged QT interval   Bigeminy   Pain   Displaced supracondylar fracture of distal end of right femur without intracondylar extension ([place])   TDWB RLE with walker DVT ppx: Lovenox, SCDs, TEDS PO pain control PT/OT Dispo: SNF placement    Hilton Cork Chayton Murata 05/04/2018, 8:19 AM   Rod Can, MD Cell [phone]

## 2018-05-04 NOTE — Evaluation (Signed)
Physical Therapy Evaluation Patient Details Name: [name] MRN: [id number] DOB: March 09, 1932 Today's Date: 05/04/2018   History of Present Illness  84 YO female who fell on RLE and was admitted to ED on 10/9. PT s/p IM nail fixation of R periprosthetic fracture on 05/03/18. PMH includes arthritis, breast cancer, depression, DVT, high cholesterol, history of alcoholism, HTN, OP, PVD, spinal stenosis, laminectomy/decompression of lumbar spine, posterior lumbar fusion L4-L5.   Clinical Impression   Pt presents with RLE pain, difficulty with mobility tasks especially stand pivot transfers, decreased knowledge of precautions, and decreased tolerance for mobility at this time. Pt to benefit from acute PT to address deficits. Pt performed stand pivot transfer with mod assist. Pt with anxiety during standing and transfers, and was difficult to give mobility instructions to during moments of increased anxiety. Pt with no complaints of dizziness during session at all. PT to continue to follow acutely, and will progress mobility as tolerated. Recommending SNF after d/c from acute level of care.     Follow Up Recommendations SNF    Equipment Recommendations  None recommended by PT    Recommendations for Other Services       Precautions / Restrictions Precautions Precautions: Fall Restrictions Weight Bearing Restrictions: Yes RLE Weight Bearing: Touchdown weight bearing RLE Partial Weight Bearing Percentage or Pounds: Op note states TDWB, orders state 30% WB. Treated and educated pt conservatively with TDWB.       Mobility  Bed Mobility Overal bed mobility: Needs Assistance Bed Mobility: Supine to Sit     Supine to sit: Min assist;HOB elevated     General bed mobility comments: Min assist for scooting to EOB, LE management. Increased time and effort.   Transfers Overall transfer level: Needs assistance Equipment used: Rolling walker (2 wheeled) Transfers: Sit to/from [employer] Sit to Stand: Mod assist;From elevated surface;+2 safety/equipment Stand pivot transfers: +2 safety/equipment;Mod assist       General transfer comment: Mod assist for trunk elevation, reinforcing precautions, steadying upon standing. Stand pivot x2, once to [facility] and once to recliner from [facility]. Pt with mod assist for stand pivot transfer for constant TDWB reinforcement,  steadying, guiding RW and pt to BSC/recliner. Pt started sitting down before touching legs to [facility], and when PT asked if pt if she was close enough, she stated "I hope so". PT guided pt to toilet seat and controlled eccentric lowering. When transferring to the recliner, pt with posterior leaning, lifting the RW off the ground, and feelings of panic about RW position. PT closely guarding pt from posterolateral position for entirety of transfer, PT pushed RW back to ground.     Ambulation/Gait Ambulation/Gait assistance: (Not attempted due to difficulty with stand pivot transfer)              Stairs            Wheelchair Mobility    Modified Rankin (Stroke Patients Only)       Balance Overall balance assessment: Needs assistance Sitting-balance support: No upper extremity supported;Feet supported Sitting balance-Leahy Scale: Fair     Standing balance support: Bilateral upper extremity supported;During functional activity Standing balance-Leahy Scale: Poor                               Pertinent Vitals/Pain Pain Assessment: 0-10 Pain Score: 4  Pain Location: R knee  Pain Descriptors / Indicators: Aching Pain Intervention(s): Repositioned;Limited activity within patient's tolerance;Premedicated before  session;Monitored during session    Home Living Family/patient expects to be discharged to:: Skilled nursing facility                      Prior Function Level of Independence: Independent with assistive device(s)         Comments: No AD in home, but would use cane  for community mobility. Pt does all ADLs independently, but does have someone come in to clean.      Hand Dominance   Dominant Hand: Right    Extremity/Trunk Assessment   Upper Extremity Assessment Upper Extremity Assessment: Overall WFL for tasks assessed    Lower Extremity Assessment Lower Extremity Assessment: Generalized weakness    Cervical / Trunk Assessment Cervical / Trunk Assessment: Normal  Communication   Communication: No difficulties  Cognition Arousal/Alertness: Awake/alert Behavior During Therapy: Anxious Overall Cognitive Status: Within Functional Limits for tasks assessed                                        General Comments      Exercises     Assessment/Plan    PT Assessment Patient needs continued PT services  PT Problem List Decreased strength;Pain;Decreased range of motion;Decreased cognition;Decreased activity tolerance;Decreased knowledge of use of DME;Decreased balance;Decreased safety awareness;Decreased mobility;Decreased knowledge of precautions       PT Treatment Interventions DME instruction;Therapeutic activities;Gait training;Therapeutic exercise;Patient/family education;Balance training;Functional mobility training    PT Goals (Current goals can be found in the Care Plan section)  Acute Rehab PT Goals PT Goal Formulation: With patient Time For Goal Achievement: 05/18/18 Potential to Achieve Goals: Good    Frequency Min 5X/week   Barriers to discharge        Co-evaluation               AM-PAC PT "6 Clicks" Daily Activity  Outcome Measure Difficulty turning over in bed (including adjusting bedclothes, sheets and blankets)?: Unable Difficulty moving from lying on back to sitting on the side of the bed? : Unable Difficulty sitting down on and standing up from a chair with arms (e.g., wheelchair, bedside commode, etc,.)?: Unable Help needed moving to and from a bed to chair (including a wheelchair)?: A  Lot Help needed walking in hospital room?: A Lot Help needed climbing 3-5 steps with a railing? : Total 6 Click Score: 8    End of Session Equipment Utilized During Treatment: Gait belt Activity Tolerance: Patient limited by pain;Patient limited by fatigue;Other (comment)(limited by anxiety) Patient left: in chair;with chair alarm set;with call bell/phone within reach;with SCD's reapplied Nurse Communication: Mobility status PT Visit Diagnosis: History of falling (Z91.81);Unsteadiness on feet (R26.81)    Time: 5732-2025 PT Time Calculation (min) (ACUTE ONLY): 30 min   Charges:   PT Evaluation $PT Eval Low Complexity: 1 Low PT Treatments $Therapeutic Activity: 8-22 mins        Julien Girt, PT Acute Rehabilitation Services Pager [phone]  Office [phone]   Orissa Arreaga D Elonda Husky 05/04/2018, 6:34 PM

## 2018-05-04 NOTE — Progress Notes (Signed)
PROGRESS NOTE    [name]  LZJ:[id number] DOB: Apr 23, 1932 DOA: 05/02/2018 PCP: Burnard Bunting, MD  Brief Narrative: 84 y.o.femalewith medical history significant of orthostatic hypotension, hyperlipidemia and lower extremity edema due to venous insufficiency,Left Breast Cancer20 years ago   Presented witha fall she was trying to saw a pair of pants. No head injury. Resulting in rotation of right leg no LOC she is not sure how she fell she had to crawl on the floor for an hour in order to get to the phone to call EMS not on any blood thinners EMS arrived administered 100 mcg of fentanyl.  She denies shortness of breath or chest pain she goes to the Gym twice no exertional chest pain. Able to walk up a flight of stairs.  Regarding pertinent Chronic problems:Orthostatic hypotension and leg edema due to venous stasis followed by cardiology wears compression stockings  hx of Rightartificial knee had a total knee replacement3 years ago    March 2016, her echocardiogram showed normal findings with a session of moderate left atrial enlargement and mild diastolic left ventricular dysfunction   Echocardiogram 05/03/2018 normal cavity size,There was mild focal   basal hypertrophy of the septum. Systolic function was normal.   The estimated ejection fraction was in the range of 60% to 65%.   Wall motion was normal; there were no regional wall motion   abnormalities. Doppler parameters are consistent with abnormal   left ventricular relaxation (grade 1 diastolic dysfunction). - Aortic valve: Mildly calcified annulus. Trileaflet; normal   thickness leaflets. There was trivial regurgitation. - Mitral valve: Calcified annulus. There was mild regurgitation. - Left atrium: The atrium was moderately dilated. - Pulmonary arteries: Systolic pressure was mildly increased. PA   peak pressure: 38 mm Hg (S). Assessment & Plan:   Active Problems:   Essential hypertension,  benign   Orthostatic hypotension   Diastolic dysfunction without heart failure   Femur fracture, right (HCC)   Prolonged QT interval   Bigeminy   Pain   Displaced supracondylar fracture of distal end of right femur without intracondylar extension ([place])  #1 right femur fracture status post fall status post right retrograde intramedullary nail fixation.  Continue pain control bowel softeners DVT prophylaxis with Lovenox social worker consult for SNF placement PT evaluation pending  #2 history of hypertension with orthostatic hypotension stable  #3 hyperlipidemia continue statin    DVT prophylaxis:lovenox Code Status:full Family Communication:none Disposition Plan will need SNF placement.  PT eval Consultants:  Ortho Procedures: Right femur intramedullary nail fixation 05/03/2018 Antimicrobials: None  Subjective: Patient resting in bed in no acute distress awake alert very pleasant young lady feels the pain is controlled has not had a bowel movement no nausea vomiting or abdominal pain.   Objective: Vitals:   05/03/18 1915 05/03/18 1930 05/03/18 1943 05/04/18 0445  BP: 129/68 (!) 129/56 131/61 121/73  Pulse: 75 76 70 64  Resp: 12 15 12    Temp:  98 F (36.7 C) 97.8 F (36.6 C) 97.9 F (36.6 C)  TempSrc:    Oral  SpO2: 93% 93% 94% 98%  Weight:      Height:        Intake/Output Summary (Last 24 hours) at 05/04/2018 1032 Last data filed at 05/04/2018 0300 Gross per 24 hour  Intake 2595 ml  Output 3525 ml  Net -930 ml   Filed Weights   05/02/18 1752 05/03/18 0000 05/03/18 1450  Weight: 70.3 kg 74 kg 74 kg    Examination:  General exam: Appears calm and comfortable  Respiratory system: Clear to auscultation. Respiratory effort normal. Cardiovascular system: S1 & S2 heard, RRR. No JVD, murmurs, rubs, gallops or clicks. No pedal edema. Gastrointestinal system: Abdomen is nondistended, soft and nontender. No organomegaly or masses felt. Normal bowel sounds  heard. Central nervous system: Alert and oriented. No focal neurological deficits. Extremities trace edema Skin: No rashes, lesions or ulcers Psychiatry: Judgement and insight appear normal. Mood & affect appropriate.     Data Reviewed: I have personally reviewed following labs and imaging studies  CBC: Recent Labs  Lab 05/02/18 1808 05/03/18 0600 05/04/18 0547  WBC 8.2 7.5 7.8  NEUTROABS 6.0  --   --   HGB 13.4 12.9 11.8*  HCT 41.5 41.8 37.7  MCV 97.4 101.7* 100.0  PLT 192 177 315   Basic Metabolic Panel: Recent Labs  Lab 05/02/18 1808 05/02/18 2246 05/03/18 0600 05/04/18 0547  NA 142  --  139 142  K 3.6  --  3.3* 3.9  CL 102  --  103 104  CO2 27  --  26 30  GLUCOSE 100*  --  91 142*  BUN 33*  --  22 18  CREATININE 0.85  --  0.64 0.71  CALCIUM 9.7  --  9.0 8.9  MG  --  1.7  --  1.9  PHOS  --  4.0  --   --    GFR: Estimated Creatinine Clearance: 55.4 mL/min (by C-G formula based on SCr of 0.71 mg/dL). Liver Function Tests: Recent Labs  Lab 05/03/18 0600  ALBUMIN 3.8   No results for input(s): LIPASE, AMYLASE in the last 168 hours. No results for input(s): AMMONIA in the last 168 hours. Coagulation Profile: Recent Labs  Lab 05/02/18 1808  INR 0.88   Cardiac Enzymes: Recent Labs  Lab 05/02/18 2246 05/03/18 0600 05/03/18 0942  TROPONINI <0.03 <0.03 <0.03   BNP (last 3 results) No results for input(s): PROBNP in the last 8760 hours. HbA1C: No results for input(s): HGBA1C in the last 72 hours. CBG: No results for input(s): GLUCAP in the last 168 hours. Lipid Profile: No results for input(s): CHOL, HDL, LDLCALC, TRIG, CHOLHDL, LDLDIRECT in the last 72 hours. Thyroid Function Tests: Recent Labs    05/02/18 2246  TSH 1.753   Anemia Panel: No results for input(s): VITAMINB12, FOLATE, FERRITIN, TIBC, IRON, RETICCTPCT in the last 72 hours. Sepsis Labs: No results for input(s): PROCALCITON, LATICACIDVEN in the last 168 hours.  Recent Results  (from the past 240 hour(s))  Surgical PCR screen     Status: Abnormal   Collection Time: 05/03/18 12:29 AM  Result Value Ref Range Status   MRSA, PCR NEGATIVE NEGATIVE Final   Staphylococcus aureus POSITIVE (A) NEGATIVE Final    Comment: (NOTE) The Xpert SA Assay (FDA approved for NASAL specimens in patients 71 years of age and older), is one component of a comprehensive surveillance program. It is not intended to diagnose infection nor to guide or monitor treatment. Performed at [facility], [place] [address], [place], [place] [address]          Radiology Studies: Dg Chest 1 View  Result Date: 05/02/2018 CLINICAL DATA:  Status post fall with pain EXAM: CHEST  1 VIEW COMPARISON:  October 18, 2015 FINDINGS: The heart size and mediastinal contours are stable. The heart size is enlarged. Both lungs are clear. There is no pneumothorax. Chronic deformities of several lateral left ribs are identified. Surgical clips are noted  in the left axilla. IMPRESSION: No acute cardiopulmonary disease identified. Electronically Signed   By: Abelardo Diesel M.D.   On: 05/02/2018 19:50   Dg Pelvis 1-2 Views  Result Date: 05/02/2018 CLINICAL DATA:  Status post fall with pelvic pain. EXAM: PELVIS - 1-2 VIEW COMPARISON:  None. FINDINGS: There is no evidence of pelvic fracture or dislocation. Degenerative joint changes of bilateral hips with narrowed joint space and osteophyte formation are noted. IMPRESSION: No acute fracture or dislocation. Electronically Signed   By: Abelardo Diesel M.D.   On: 05/02/2018 19:50   Dg C-arm 1-60 Min-no Report  Result Date: 05/03/2018 Fluoroscopy was utilized by the requesting physician.  No radiographic interpretation.   Dg Femur, Min 2 Views Right  Result Date: 05/02/2018 CLINICAL DATA:  Status post fall with right femur pain. EXAM: RIGHT FEMUR 2 VIEWS COMPARISON:  None. FINDINGS: There is comminuted displaced fracture of the distal right femoral shaft above  the knee replacement. There is no dislocation. IMPRESSION: Fracture of distal right femoral shaft. Electronically Signed   By: Abelardo Diesel M.D.   On: 05/02/2018 19:51   Dg Femur Port, Min 2 Views Right  Result Date: 05/03/2018 CLINICAL DATA:  Status post retrograde right femoral nail - PACU images EXAM: RIGHT FEMUR PORTABLE 2 VIEW COMPARISON:  05/02/2018 FINDINGS: Status post ORIF of comminuted distal femur fracture with intramedullary nail. Remote knee arthroplasty. Expected postoperative gas and surgical clips are present. IMPRESSION: Status post ORIF of distal femur fracture. Electronically Signed   By: Nolon Nations M.D.   On: 05/03/2018 21:08        Scheduled Meds: . docusate sodium  100 mg Oral BID  . enoxaparin (LOVENOX) injection  40 mg Subcutaneous Q24H  . escitalopram  20 mg Oral Daily  . mupirocin ointment  1 application Nasal BID  . senna  1 tablet Oral BID  . simvastatin  20 mg Oral Daily   Continuous Infusions: . sodium chloride Stopped (05/03/18 1446)  .  ceFAZolin (ANCEF) IV 2 g (05/04/18 0817)  . methocarbamol (ROBAXIN) IV       LOS: 2 days     Georgette Shell, MD [facility]  If 7PM-7AM, please contact night-coverage [email or web address] Password [email or web address] 05/04/2018, 10:32 AM

## 2018-05-04 NOTE — Progress Notes (Signed)
Pt tolerated clear fluids jello and ice cream this shift per doctor order. Pt has progressed to heart healthy diet at this time. Will continue to monitor.

## 2018-05-05 ENCOUNTER — Inpatient Hospital Stay (HOSPITAL_COMMUNITY): Payer: Medicare Other

## 2018-05-05 DIAGNOSIS — I951 Orthostatic hypotension: Secondary | ICD-10-CM

## 2018-05-05 DIAGNOSIS — I499 Cardiac arrhythmia, unspecified: Secondary | ICD-10-CM

## 2018-05-05 LAB — CBC
HCT: 34.5 % — ABNORMAL LOW (ref 36.0–46.0)
Hemoglobin: 10.8 g/dL — ABNORMAL LOW (ref 12.0–15.0)
MCH: 31.9 pg (ref 26.0–34.0)
MCHC: 31.3 g/dL (ref 30.0–36.0)
MCV: 101.8 fL — ABNORMAL HIGH (ref 80.0–100.0)
Platelets: 137 K/uL — ABNORMAL LOW (ref 150–400)
RBC: 3.39 MIL/uL — ABNORMAL LOW (ref 3.87–5.11)
RDW: 13.2 % (ref 11.5–15.5)
WBC: 8 K/uL (ref 4.0–10.5)
nRBC: 0 % (ref 0.0–0.2)

## 2018-05-05 LAB — BASIC METABOLIC PANEL
ANION GAP: 8 (ref 5–15)
BUN: 17 mg/dL (ref 8–23)
CALCIUM: 8.6 mg/dL — AB (ref 8.9–10.3)
CO2: 29 mmol/L (ref 22–32)
Chloride: 101 mmol/L (ref 98–111)
Creatinine, Ser: 0.57 mg/dL (ref 0.44–1.00)
Glucose, Bld: 119 mg/dL — ABNORMAL HIGH (ref 70–99)
POTASSIUM: 4.3 mmol/L (ref 3.5–5.1)
SODIUM: 138 mmol/L (ref 135–145)

## 2018-05-05 MED ORDER — BISACODYL 10 MG RE SUPP
10.0000 mg | Freq: Once | RECTAL | Status: AC
  Administered 2018-05-05: 10 mg via RECTAL
  Filled 2018-05-05: qty 1

## 2018-05-05 MED ORDER — BISACODYL 5 MG PO TBEC
10.0000 mg | DELAYED_RELEASE_TABLET | Freq: Once | ORAL | Status: AC
  Administered 2018-05-05: 10 mg via ORAL
  Filled 2018-05-05: qty 2

## 2018-05-05 MED ORDER — LACTULOSE 10 GM/15ML PO SOLN
20.0000 g | Freq: Once | ORAL | Status: AC
  Administered 2018-05-05: 20 g via ORAL
  Filled 2018-05-05: qty 30

## 2018-05-05 NOTE — Progress Notes (Signed)
Progress Note  Patient Name: [name] Date of Encounter: 05/05/2018  Primary Cardiologist: Sanda , MD    Patient Profile      [name] is a 84 y.o. female with a history of orthostatic hypotension, hyperlipidemia, and chronic lower extremity edema secondary to venous insufficiency  being seen  for pre-operative clearance for a femur fracture    Neighbor of MCr and friend of GT/PR   Subjective   Leg with less pain no sob  Inpatient Medications    Scheduled Meds: . bisacodyl  10 mg Oral Once  . bisacodyl  10 mg Rectal Once  . docusate sodium  100 mg Oral BID  . enoxaparin (LOVENOX) injection  40 mg Subcutaneous Q24H  . escitalopram  20 mg Oral Daily  . lactulose  20 g Oral Once  . mupirocin ointment  1 application Nasal BID  . senna  1 tablet Oral BID  . simvastatin  20 mg Oral Daily   Continuous Infusions: . methocarbamol (ROBAXIN) IV     PRN Meds: acetaminophen, bisacodyl, HYDROcodone-acetaminophen, HYDROcodone-acetaminophen, menthol-cetylpyridinium **OR** phenol, methocarbamol **OR** methocarbamol (ROBAXIN) IV, morphine injection, ondansetron **OR** ondansetron (ZOFRAN) IV   Vital Signs    Vitals:   05/04/18 0445 05/04/18 1316 05/04/18 2041 05/05/18 0521  BP: 121/73 (!) 125/46 (!) 116/49 136/75  Pulse: 64 71 79 84  Resp:  18 18 18   Temp: 97.9 F (36.6 C) 99.1 F (37.3 C) 99.3 F (37.4 C) 98.6 F (37 C)  TempSrc: Oral Oral Oral Oral  SpO2: 98% 95% 91% 91%  Weight:      Height:        Intake/Output Summary (Last 24 hours) at 05/05/2018 0850 Last data filed at 05/04/2018 1700 Gross per 24 hour  Intake 680 ml  Output -  Net 680 ml   Filed Weights   05/02/18 1752 05/03/18 0000 05/03/18 1450  Weight: 70.3 kg 74 kg 74 kg    Telemetry     sinus with freq PACs and bradycardia, real and fucntional ( atrial bigeminY)Personally Reviewed  ECG      Physical Exam   Well developed and nourished in no acute distress HENT  normal Neck supple with JVP-flat Clear Regular rate and rhythm, no murmurs or gallops Abd-soft with active BS No Clubbing cyanosis edema Skin-warm and dry A & Oriented  Grossly normal sensory and motor function    Labs    Chemistry Recent Labs  Lab 05/03/18 0600 05/04/18 0547 05/05/18 0553  NA 139 142 138  K 3.3* 3.9 4.3  CL 103 104 101  CO2 26 30 29   GLUCOSE 91 142* 119*  BUN 22 18 17   CREATININE 0.64 0.71 0.57  CALCIUM 9.0 8.9 8.6*  ALBUMIN 3.8  --   --   GFRNONAA >60 >60 >60  GFRAA >60 >60 >60  ANIONGAP 10 8 8      Hematology Recent Labs  Lab 05/03/18 0600 05/04/18 0547 05/05/18 0553  WBC 7.5 7.8 8.0  RBC 4.11 3.77* 3.39*  HGB 12.9 11.8* 10.8*  HCT 41.8 37.7 34.5*  MCV 101.7* 100.0 101.8*  MCH 31.4 31.3 31.9  MCHC 30.9 31.3 31.3  RDW 13.0 13.0 13.2  PLT 177 174 137*    Cardiac Enzymes Recent Labs  Lab 05/02/18 2246 05/03/18 0600 05/03/18 0942  TROPONINI <0.03 <0.03 <0.03   No results for input(s): TROPIPOC in the last 168 hours.   BNPNo results for input(s): BNP, PROBNP in the last 168 hours.   DDimer  No results for input(s): DDIMER in the last 168 hours.   Radiology      Cardiac Studies   Echo 05/03/18 ------------------------------------------------------------------- Study Conclusions  - Left ventricle: The cavity size was normal. There was mild focal   basal hypertrophy of the septum. Systolic function was normal.   The estimated ejection fraction was in the range of 60% to 65%.   Wall motion was normal; there were no regional wall motion   abnormalities. Doppler parameters are consistent with abnormal   left ventricular relaxation (grade 1 diastolic dysfunction). - Aortic valve: Mildly calcified annulus. Trileaflet; normal   thickness leaflets. There was trivial regurgitation. - Mitral valve: Calcified annulus. There was mild regurgitation. - Left atrium: The atrium was moderately dilated. - Pulmonary arteries: Systolic  pressure was mildly increased. PA   peak pressure: 38 mm Hg (S).   Assessment & Plan    Femur fracture  Orthostatic hypotension-history  Ectopy-PACs/PVCs  Sinus brady   Stable post operative  AS she is bed confined would use reverse trendelenburg to try and attenuate propensity to bed rest worsening of Orthostatic hypotension       For questions or updates, please contact [place] Please consult [email or web address] for contact info under        Signed, Virl Axe, MD  05/05/2018, 8:50 AM

## 2018-05-05 NOTE — Progress Notes (Signed)
PROGRESS NOTE    [name]  EHU:[id number] DOB: 1932/06/23 DOA: 05/02/2018 PCP: Burnard Bunting, MD  Brief Narrative:85 y.o.femalewith medical history significant of orthostatic hypotension, hyperlipidemia and lower extremity edema due to venous insufficiency,Left Breast Cancer20 years ago   Presented witha fall she was trying to saw a pair of pants. No head injury. Resulting in rotation of right leg no LOC she is not sure how she fell she had to crawl on the floor for an hour in order to get to the phone to call EMS not on any blood thinners EMS arrived administered 100 mcg of fentanyl.  She denies shortness of breath or chest pain she goes to the Gym twice no exertional chest pain. Able to walk up a flight of stairs.  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 Systolic function was normal. The estimated ejection fraction was in the range of 60% to 65%. Wall motion was normal; there were no regional wall motion abnormalities. Doppler parameters are consistent with abnormal left ventricular relaxation (grade 1 diastolic dysfunction). - Aortic valve: Mildly calcified annulus. Trileaflet; normal thickness leaflets. There was trivial regurgitation. - Mitral valve: Calcified annulus. There was mild regurgitation. - Left atrium: The atrium was moderately dilated. - Pulmonary arteries: Systolic pressure was mildly increased. PA peak pressure: 38 mm Hg (  Assessment & Plan:   Active Problems:   Essential hypertension,  benign   Orthostatic hypotension   Diastolic dysfunction without heart failure   Femur fracture, right (HCC)   Prolonged QT interval   Bigeminy   Pain   Displaced supracondylar fracture of distal end of right femur without intracondylar extension ([place])   Postop check   #1 right femur fracture status post fall status post right retrograde intramedullary nail fixation.  Continue pain control bowel softeners DVT prophylaxis with Lovenox social worker consult for SNF placement PT evaluation recommends SNF.  #2 history of hypertension with orthostatic hypotension stable  #3 hyperlipidemia continue statin  #4 constipation start stool softeners laxatives bladder scan if urine more than 300 keep the Foley in.   DVT prophylaxis Lovenox Code Status: Full code Family Communication: None Disposition Plan: Await SNF placement  Consultants: Ortho  Procedures right femur intramedullary nail fixation 05/03/2018 Antimicrobials: None  Subjective: Resting in bed in no acute distress reports she cannot pee or poop.  She has not had any bowel movement.   Objective: Vitals:   05/04/18 1316 05/04/18 2041 05/05/18 0521 05/05/18 1003  BP: (!) 125/46 (!) 116/49 136/75 120/66  Pulse: 71 79 84 76  Resp: 18 18 18 14   Temp: 99.1 F (37.3 C) 99.3 F (37.4 C) 98.6 F (37 C) 98.5 F (36.9 C)  TempSrc: Oral Oral Oral Oral  SpO2: 95% 91% 91% (!) 89%  Weight:      Height:        Intake/Output Summary (Last 24 hours) at 05/05/2018 1217 Last data filed at 05/05/2018 1026 Gross per 24 hour  Intake 560 ml  Output 350 ml  Net 210 ml   Filed Weights   05/02/18 1752 05/03/18 0000 05/03/18 1450  Weight: 70.3 kg 74 kg 74 kg  Examination:  General exam: Appears calm and comfortable  Respiratory system: Clear to auscultation. Respiratory effort normal. Cardiovascular system: S1 & S2 heard, RRR. No JVD, murmurs, rubs, gallops or clicks. No pedal edema. Gastrointestinal system: Abdomen is  nondistended, soft and nontender. No organomegaly or masses felt. Normal bowel sounds heard. Central nervous system: Alert and oriented. No focal neurological deficits. Extremities: Trace right lower extremity edema Skin: No rashes, lesions or ulcers Psychiatry: Judgement and insight appear normal. Mood & affect appropriate.     Data Reviewed: I have personally reviewed following labs and imaging studies  CBC: Recent Labs  Lab 05/02/18 1808 05/03/18 0600 05/04/18 0547 05/05/18 0553  WBC 8.2 7.5 7.8 8.0  NEUTROABS 6.0  --   --   --   HGB 13.4 12.9 11.8* 10.8*  HCT 41.5 41.8 37.7 34.5*  MCV 97.4 101.7* 100.0 101.8*  PLT 192 177 174 644*   Basic Metabolic Panel: Recent Labs  Lab 05/02/18 1808 05/02/18 2246 05/03/18 0600 05/04/18 0547 05/05/18 0553  NA 142  --  139 142 138  K 3.6  --  3.3* 3.9 4.3  CL 102  --  103 104 101  CO2 27  --  26 30 29   GLUCOSE 100*  --  91 142* 119*  BUN 33*  --  22 18 17   CREATININE 0.85  --  0.64 0.71 0.57  CALCIUM 9.7  --  9.0 8.9 8.6*  MG  --  1.7  --  1.9  --   PHOS  --  4.0  --   --   --    GFR: Estimated Creatinine Clearance: 55.4 mL/min (by C-G formula based on SCr of 0.57 mg/dL). Liver Function Tests: Recent Labs  Lab 05/03/18 0600  ALBUMIN 3.8   No results for input(s): LIPASE, AMYLASE in the last 168 hours. No results for input(s): AMMONIA in the last 168 hours. Coagulation Profile: Recent Labs  Lab 05/02/18 1808  INR 0.88   Cardiac Enzymes: Recent Labs  Lab 05/02/18 2246 05/03/18 0600 05/03/18 0942  TROPONINI <0.03 <0.03 <0.03   BNP (last 3 results) No results for input(s): PROBNP in the last 8760 hours. HbA1C: No results for input(s): HGBA1C in the last 72 hours. CBG: No results for input(s): GLUCAP in the last 168 hours. Lipid Profile: No results for input(s): CHOL, HDL, LDLCALC, TRIG, CHOLHDL, LDLDIRECT in the last 72 hours. Thyroid Function Tests: Recent Labs    05/02/18 2246  TSH 1.753   Anemia  Panel: No results for input(s): VITAMINB12, FOLATE, FERRITIN, TIBC, IRON, RETICCTPCT in the last 72 hours. Sepsis Labs: No results for input(s): PROCALCITON, LATICACIDVEN in the last 168 hours.  Recent Results (from the past 240 hour(s))  Surgical PCR screen     Status: Abnormal   Collection Time: 05/03/18 12:29 AM  Result Value Ref Range Status   MRSA, PCR NEGATIVE NEGATIVE Final   Staphylococcus aureus POSITIVE (A) NEGATIVE Final    Comment: (NOTE) The Xpert SA Assay (FDA approved for NASAL specimens in patients 56 years of age and older), is one component of a comprehensive surveillance program. It is not intended to diagnose infection nor to guide or monitor treatment. Performed at [facility], [address], [place], [place] [address]          Radiology Studies: Dg Abd 1 View  Result Date: 05/05/2018 CLINICAL DATA:  Pt 2 days postop from right IM nail with complaints of constipation. Pt stated her last bowel movement was  on Wednesday. EXAM: ABDOMEN - 1 VIEW COMPARISON:  None. FINDINGS: Overall bowel gas pattern is nonobstructive. Gas and stool throughout the colon, but no associated bowel distension. No evidence of soft tissue mass or abnormal fluid collection. No evidence of free intraperitoneal air. Recently placed intramedullary rod is partially imaged at the lower aspects of the study. IMPRESSION: Nonobstructive bowel gas pattern. No convincing radiographic evidence of constipation. Gas and stool throughout the colon, but no associated bowel distension. Electronically Signed   By: Franki Cabot M.D.   On: 05/05/2018 11:12   Dg C-arm 1-60 Min-no Report  Result Date: 05/03/2018 Fluoroscopy was utilized by the requesting physician.  No radiographic interpretation.   Dg Femur [place], [place] 2 Views Right  Result Date: 05/03/2018 CLINICAL DATA:  Status post retrograde right femoral nail - PACU images EXAM: RIGHT FEMUR PORTABLE 2 VIEW COMPARISON:  05/02/2018  FINDINGS: Status post ORIF of comminuted distal femur fracture with intramedullary nail. Remote knee arthroplasty. Expected postoperative gas and surgical clips are present. IMPRESSION: Status post ORIF of distal femur fracture. Electronically Signed   By: Nolon Nations M.D.   On: 05/03/2018 21:08        Scheduled Meds: . docusate sodium  100 mg Oral BID  . enoxaparin (LOVENOX) injection  40 mg Subcutaneous Q24H  . escitalopram  20 mg Oral Daily  . senna  1 tablet Oral BID  . simvastatin  20 mg Oral Daily   Continuous Infusions: . methocarbamol (ROBAXIN) IV       LOS: 3 days     Georgette Shell, MD [facility]  If 7PM-7AM, please contact night-coverage [email or web address] Password [facility] 05/05/2018, 12:17 PM

## 2018-05-05 NOTE — Progress Notes (Signed)
    Subjective:  Patient reports pain as mild to moderate.  Denies N/V/CP/SOB. No c/o.  Objective:   VITALS:   Vitals:   05/04/18 0445 05/04/18 1316 05/04/18 2041 05/05/18 0521  BP: 121/73 (!) 125/46 (!) 116/49 136/75  Pulse: 64 71 79 84  Resp:  18 18 18   Temp: 97.9 F (36.6 C) 99.1 F (37.3 C) 99.3 F (37.4 C) 98.6 F (37 C)  TempSrc: Oral Oral Oral Oral  SpO2: 98% 95% 91% 91%  Weight:      Height:        NAD ABD soft Sensation intact distally Intact pulses distally Dorsiflexion/Plantar flexion intact Incision: dressing C/D/I Compartment soft   Lab Results  Component Value Date   WBC 8.0 05/05/2018   HGB 10.8 (L) 05/05/2018   HCT 34.5 (L) 05/05/2018   MCV 101.8 (H) 05/05/2018   PLT 137 (L) 05/05/2018   BMET    Component Value Date/Time   NA 138 05/05/2018 0553   K 4.3 05/05/2018 0553   CL 101 05/05/2018 0553   CO2 29 05/05/2018 0553   GLUCOSE 119 (H) 05/05/2018 0553   BUN 17 05/05/2018 0553   CREATININE 0.57 05/05/2018 0553   CREATININE 0.75 06/13/2012 1057   CALCIUM 8.6 (L) 05/05/2018 0553   GFRNONAA >60 05/05/2018 0553   GFRNONAA 88 05/23/2012 1337   GFRAA >60 05/05/2018 0553   GFRAA >89 05/23/2012 1337     Assessment/Plan: 2 Days Post-Op   Active Problems:   Essential hypertension, benign   Orthostatic hypotension   Diastolic dysfunction without heart failure   Femur fracture, right (HCC)   Prolonged QT interval   Bigeminy   Pain   Displaced supracondylar fracture of distal end of right femur without intracondylar extension ([place])   Postop check   TDWB RLE with walker DVT ppx: Lovenox, SCDs, TEDS PO pain control PT/OT Dispo: SNF placement    [name] 05/05/2018, 9:25 AM   Rod Can, MD Cell [phone]

## 2018-05-06 LAB — CBC
HCT: 33.3 % — ABNORMAL LOW (ref 36.0–46.0)
Hemoglobin: 10.6 g/dL — ABNORMAL LOW (ref 12.0–15.0)
MCH: 31.7 pg (ref 26.0–34.0)
MCHC: 31.8 g/dL (ref 30.0–36.0)
MCV: 99.7 fL (ref 80.0–100.0)
PLATELETS: 153 10*3/uL (ref 150–400)
RBC: 3.34 MIL/uL — AB (ref 3.87–5.11)
RDW: 13 % (ref 11.5–15.5)
WBC: 7.8 10*3/uL (ref 4.0–10.5)
nRBC: 0 % (ref 0.0–0.2)

## 2018-05-06 LAB — BASIC METABOLIC PANEL
ANION GAP: 10 (ref 5–15)
BUN: 13 mg/dL (ref 8–23)
CHLORIDE: 101 mmol/L (ref 98–111)
CO2: 27 mmol/L (ref 22–32)
Calcium: 8.5 mg/dL — ABNORMAL LOW (ref 8.9–10.3)
Creatinine, Ser: 0.54 mg/dL (ref 0.44–1.00)
GFR calc Af Amer: >60 mL/min (ref >60)
GLUCOSE: 106 mg/dL — AB (ref 70–99)
Potassium: 3.8 mmol/L (ref 3.5–5.1)
Sodium: 138 mmol/L (ref 135–145)

## 2018-05-06 MED ORDER — BISACODYL 5 MG PO TBEC
10.0000 mg | DELAYED_RELEASE_TABLET | Freq: Once | ORAL | Status: AC
  Administered 2018-05-06: 10 mg via ORAL
  Filled 2018-05-06: qty 2

## 2018-05-06 MED ORDER — BISACODYL 10 MG RE SUPP
10.0000 mg | Freq: Once | RECTAL | Status: AC
  Administered 2018-05-06: 10 mg via RECTAL
  Filled 2018-05-06: qty 1

## 2018-05-06 MED ORDER — LACTULOSE 10 GM/15ML PO SOLN
20.0000 g | Freq: Once | ORAL | Status: AC
  Administered 2018-05-06: 20 g via ORAL
  Filled 2018-05-06: qty 30

## 2018-05-06 NOTE — Clinical Social Work Note (Signed)
Clinical Social Work Assessment  Patient Details  Name: [name] MRN: [id number] Date of Birth: 12/02/1931  Date of referral:  05/04/18               Reason for consult:  Facility Placement                Permission sought to share information with:  Facility Art therapist granted to share information::  Yes, Verbal Permission Granted  Name::     [name]  Agency::  SNF  Relationship::  [facility]  Contact Information:  [phone]  Housing/Transportation Living arrangements for the past 2 months:  [place] Information:  Patient Patient Interpreter Needed:  None Criminal Activity/Legal Involvement Pertinent to Current Situation/Hospitalization:  No - Comment as needed Significant Relationships:  Other Family Members, Spouse, Adult Children Lives with:  Spouse Do you feel safe going back to the place where you live?  Yes Need for family participation in patient care:  No (Coment)  Care giving concerns:  Patient lives at home with husband will need short term rehab to regain strength before returning home.    Social Worker assessment / plan:  CSW met with patient at bedside to discuss plan for discharge. Patient lives at home with husband and has supportive family nearby including two daughters. Patient presented to hospital after a fall.  Patient reports she has previously been to [facility] and prefers to return there. She reports positive experiences at [facility] and loves their therapy department.   CSW will complete FL2 and send out referrals.   Employment status:  Retired Nurse, adult PT Recommendations:  [place] / Referral to community resources:  [place]  Patient/Family's Response to care:  Patient is appreciative of CSW involvement and conversation.   Patient/Family's Understanding of and Emotional Response to Diagnosis, Current Treatment,  and Prognosis:  Patient understands SNF process and has been to SNF previously. She knows what to expect at d/c and how SNF placement works. She had no questions for CSW.  Emotional Assessment Appearance:  Appears stated age Attitude/Demeanor/Rapport:  Engaged Affect (typically observed):  Appropriate, Pleasant Orientation:  Oriented to Self, Oriented to Place, Oriented to  Time, Oriented to Situation Alcohol / Substance use:  Not Applicable Psych involvement (Current and /or in the community):  Outpatient Provider  Discharge Needs  Concerns to be addressed:  Care Coordination Readmission within the last 30 days:  No Current discharge risk:  Physical Impairment Barriers to Discharge:  Ship broker, Continued Medical Work up   [employer], Anton Ruiz 05/06/2018, 4:40 PM

## 2018-05-06 NOTE — Progress Notes (Signed)
PROGRESS NOTE    [name]  UTM:[id number] DOB: [birth year] DOA: 05/02/2018 PCP: Burnard Bunting, [name] Brief Narrative:85 y.o.femalewith medical history significant of orthostatic hypotension, hyperlipidemia and lower extremity edema due to venous insufficiency,Left Breast Cancer20 years ago   Presented witha fall she was trying to saw a pair of pants. No head injury. Resulting in rotation of right leg no LOC she is not sure how she fell she had to crawl on the floor for an hour in order to get to the phone to call EMS not on any blood thinners EMS arrived administered 100 mcg of fentanyl.  She denies shortness of breath or chest pain she goes to the Gym twice no exertional chest pain. Able to walk up a flight of stairs.  Regarding pertinent Chronic problems:Orthostatic hypotension and leg edema due to venous stasis followed by cardiology wears compression stockings  hx of Rightartificial knee had a total knee replacement3 years ago    March 2016, her echocardiogram showed normal findings with a session of moderate left atrial enlargement and mild diastolic left ventricular dysfunction March 2016, her echocardiogram showed normal findings with a session of moderate left atrial enlargement and mild diastolic left ventricular dysfunction  Echocardiogram 05/03/2018 normal cavity size,There was mild focal basal hypertrophy of the septum. Systolic function was normal. The estimated ejection fraction was in the range of 60% to 65%. Wall motion was normal; there were no regional wall motion abnormalities. Doppler parameters are consistent with abnormal left ventricular relaxation (grade 1 diastolic dysfunction). - Aortic valve: Mildly calcified annulus. Trileaflet; normal thickness leaflets. There was trivial regurgitation. - Mitral valve: Calcified annulus. There was mild regurgitation. - Left atrium: The atrium was moderately dilated. - Pulmonary  arteries: Systolic pressure was mildly increased. PA peak pressure: 38 mm Hg (   Assessment & Plan:   Active Problems:   Essential hypertension, benign   Orthostatic hypotension   Diastolic dysfunction without heart failure   Femur fracture, right (HCC)   Prolonged QT interval   Bigeminy   Pain   Displaced supracondylar fracture of distal end of right femur without intracondylar extension ([place])   Postop check   #1 right femur fracture status post fall status post right retrograde intramedullary nail fixation. Continue pain control bowel softeners DVT prophylaxis with Lovenox social worker consult for SNF placement PT evaluation recommends SNF.  #2 history of hypertension with orthostatic hypotension stable  #3hyperlipidemia continue statin  #4 constipation -continue stool softners.foley placed due to urine retention.   DVT prophylaxis: lovenox Code Status:full Family Communication:none Disposition Plan:snf when bed available  Consultants: ortho  Procedures: right femur intramedullary nail fixation 05/03/2018 Antimicrobials: None Subjective: Patient resting in bed in no acute distress  Objective: Vitals:   05/05/18 1308 05/05/18 1837 05/05/18 2113 05/06/18 0614  BP: (!) 105/52 (!) 116/56 (!) 143/48 (!) 96/48  Pulse: (!) 43 77 (!) 54 (!) 40  Resp: 14 20 19 18   Temp: 98.8 F (37.1 C) 97.6 F (36.4 C) 98.3 F (36.8 C) 98.5 F (36.9 C)  TempSrc: Oral Oral Oral Oral  SpO2: 90% 90% 93% 94%  Weight:      Height:        Intake/Output Summary (Last 24 hours) at 05/06/2018 1124 Last data filed at 05/06/2018 0600 Gross per 24 hour  Intake 240 ml  Output 1550 ml  Net -1310 ml   Filed Weights   05/02/18 1752 05/03/18 0000 05/03/18 1450  Weight: 70.3 kg 74 kg 74 kg  Examination:  General exam: Appears calm and comfortable  Respiratory system: Clear to auscultation. Respiratory effort normal. Cardiovascular system: S1 & S2 heard, RRR. No JVD,  murmurs, rubs, gallops or clicks. No pedal edema. Gastrointestinal system: Abdomen is nondistended, soft and nontender. No organomegaly or masses felt. Normal bowel sounds heard. Central nervous system: Alert and oriented. No focal neurological deficits. Extremities: trace rle edema Skin: No rashes, lesions or ulcers Psychiatry: Judgement and insight appear normal. Mood & affect appropriate.     Data Reviewed: I have personally reviewed following labs and imaging studies  CBC: Recent Labs  Lab 05/02/18 1808 05/03/18 0600 05/04/18 0547 05/05/18 0553 05/06/18 0522  WBC 8.2 7.5 7.8 8.0 7.8  NEUTROABS 6.0  --   --   --   --   HGB 13.4 12.9 11.8* 10.8* 10.6*  HCT 41.5 41.8 37.7 34.5* 33.3*  MCV 97.4 101.7* 100.0 101.8* 99.7  PLT 192 177 174 137* 419   Basic Metabolic Panel: Recent Labs  Lab 05/02/18 1808 05/02/18 2246 05/03/18 0600 05/04/18 0547 05/05/18 0553 05/06/18 0522  NA 142  --  139 142 138 138  K 3.6  --  3.3* 3.9 4.3 3.8  CL 102  --  103 104 101 101  CO2 27  --  26 30 29 27   GLUCOSE 100*  --  91 142* 119* 106*  BUN 33*  --  22 18 17 13   CREATININE 0.85  --  0.64 0.71 0.57 0.54  CALCIUM 9.7  --  9.0 8.9 8.6* 8.5*  MG  --  1.7  --  1.9  --   --   PHOS  --  4.0  --   --   --   --    GFR: Estimated Creatinine Clearance: 55.4 mL/min (by C-G formula based on SCr of 0.54 mg/dL). Liver Function Tests: Recent Labs  Lab 05/03/18 0600  ALBUMIN 3.8   No results for input(s): LIPASE, AMYLASE in the last 168 hours. No results for input(s): AMMONIA in the last 168 hours. Coagulation Profile: Recent Labs  Lab 05/02/18 1808  INR 0.88   Cardiac Enzymes: Recent Labs  Lab 05/02/18 2246 05/03/18 0600 05/03/18 0942  TROPONINI <0.03 <0.03 <0.03   BNP (last 3 results) No results for input(s): PROBNP in the last 8760 hours. HbA1C: No results for input(s): HGBA1C in the last 72 hours. CBG: No results for input(s): GLUCAP in the last 168 hours. Lipid Profile: No  results for input(s): CHOL, HDL, LDLCALC, TRIG, CHOLHDL, LDLDIRECT in the last 72 hours. Thyroid Function Tests: No results for input(s): TSH, T4TOTAL, FREET4, T3FREE, THYROIDAB in the last 72 hours. Anemia Panel: No results for input(s): VITAMINB12, FOLATE, FERRITIN, TIBC, IRON, RETICCTPCT in the last 72 hours. Sepsis Labs: No results for input(s): PROCALCITON, LATICACIDVEN in the last 168 hours.  Recent Results (from the past 240 hour(s))  Surgical PCR screen     Status: Abnormal   Collection Time: 05/03/18 12:29 AM  Result Value Ref Range Status   MRSA, PCR NEGATIVE NEGATIVE Final   Staphylococcus aureus POSITIVE (A) NEGATIVE Final    Comment: (NOTE) The Xpert SA Assay (FDA approved for NASAL specimens in patients 60 years of age and older), is one component of a comprehensive surveillance program. It is not intended to diagnose infection nor to guide or monitor treatment. Performed at [facility], [place] [address], [place], [place] [address]          Radiology Studies: Dg Abd 1 View  Result Date: 05/05/2018 CLINICAL DATA:  Pt 2 days postop from right IM nail with complaints of constipation. Pt stated her last bowel movement was on Wednesday. EXAM: ABDOMEN - 1 VIEW COMPARISON:  None. FINDINGS: Overall bowel gas pattern is nonobstructive. Gas and stool throughout the colon, but no associated bowel distension. No evidence of soft tissue mass or abnormal fluid collection. No evidence of free intraperitoneal air. Recently placed intramedullary rod is partially imaged at the lower aspects of the study. IMPRESSION: Nonobstructive bowel gas pattern. No convincing radiographic evidence of constipation. Gas and stool throughout the colon, but no associated bowel distension. Electronically Signed   By: Franki Cabot M.D.   On: 05/05/2018 11:12        Scheduled Meds: . docusate sodium  100 mg Oral BID  . enoxaparin (LOVENOX) injection  40 mg Subcutaneous Q24H  .  escitalopram  20 mg Oral Daily  . senna  1 tablet Oral BID  . simvastatin  20 mg Oral Daily   Continuous Infusions: . methocarbamol (ROBAXIN) IV       LOS: 4 days     Pamela Shell, [name] [facility]  If 7PM-7AM, please contact night-coverage [email or web address] Password [email or web address] 05/06/2018, 11:24 AM

## 2018-05-06 NOTE — Progress Notes (Signed)
[name]  MRN: [id number] DOB/Age: July 02, 1932 84 y.o. [facility] Procedure: Procedure(s) (LRB): RIGHT RETROGRADE FEMORAL NAIL (Right)     Subjective: Awake and alert, pain controlled on tylenol. States she feels progressively better since surgery, She desires a stay at [facility] where she has had good care previously  Vital Signs Temp:  [97.6 F (36.4 C)-98.8 F (37.1 C)] 98.5 F (36.9 C) (10/13 0614) Pulse Rate:  [40-77] 40 (10/13 0614) Resp:  [14-20] 18 (10/13 0614) BP: (96-143)/(48-66) 96/48 (10/13 0614) SpO2:  [89 %-94 %] 94 % (10/13 0614)  Lab Results Recent Labs    05/05/18 0553 05/06/18 0522  WBC 8.0 7.8  HGB 10.8* 10.6*  HCT 34.5* 33.3*  PLT 137* 153   BMET Recent Labs    05/05/18 0553 05/06/18 0522  NA 138 138  K 4.3 3.8  CL 101 101  CO2 29 27  GLUCOSE 119* 106*  BUN 17 13  CREATININE 0.57 0.54  CALCIUM 8.6* 8.5*   INR  Date Value Ref Range Status  05/02/2018 0.88  Final    Comment:    Performed at [facility], [place] [address], [place], [place] [address]     Exam Moving right leg well NVI        Plan Cont care per medicine Continue Out of bed with therapy TDWB R LE  Jenetta Loges PA-C  05/06/2018, 9:45 AM Contact # [phone]

## 2018-05-06 NOTE — NC FL2 (Signed)
[place] LEVEL OF CARE SCREENING TOOL     IDENTIFICATION  Patient Name: [name] Birthdate: September 10, 1931 Sex: female Admission Date (Current Location): 05/02/2018  [facility] and [place] Number:  Herbalist and Address:  [facility],  [place], [place]      Provider Number: [id number]  Attending Physician Name and Address:  Georgette Shell, MD  Relative Name and Phone Number:  Bunnie Rehberg: [phone]    Current Level of Care: Hospital Recommended Level of Care: [place] Prior Approval Number:    Date Approved/Denied:   PASRR Number: [id number]  Discharge Plan: SNF    Current Diagnoses: Patient Active Problem List   Diagnosis Date Noted  . Displaced supracondylar fracture of distal end of right femur without intracondylar extension ([place]) 05/04/2018  . Postop check   . Pain   . Femur fracture, right ([place]) 05/02/2018  . Prolonged QT interval 05/02/2018  . Bigeminy 05/02/2018  . Left ventricular diastolic dysfunction with preserved systolic function 49/67/5916  . PAT (paroxysmal atrial tachycardia) ([place]) 03/24/2016  . Diastolic dysfunction without heart failure 03/24/2016  . Peripheral venous insufficiency 03/24/2016  . Closed wedge compression fracture of fourth lumbar vertebra ([place])   . Near syncope 10/20/2014  . Orthostatic hypotension 10/20/2014  . Fatigue 10/19/2014  . Syncope 10/19/2014  . Generalized weakness   . Elevated troponin   . Dyspnea on exertion   . Rib fracture 09/05/2014  . HX: breast cancer 09/26/2013  . Depression 11/20/2012  . Unspecified constipation 11/20/2012  . Other and unspecified hyperlipidemia 11/20/2012  . Essential hypertension, benign 11/20/2012  . Postoperative anemia due to acute blood loss 11/01/2012  . Hyponatremia 11/01/2012  . Hypokalemia 11/01/2012  . OA (osteoarthritis) of knee 10/29/2012  . Septic joint of right knee joint ([place]) 05/23/2012   . Elevated liver enzymes 05/23/2012    Orientation RESPIRATION BLADDER Height & Weight     Self, Time, Situation, Place  O2(2L) Indwelling catheter Weight: 163 lb 2.3 oz (74 kg) Height:  5' 6.5" (168.9 cm)  BEHAVIORAL SYMPTOMS/MOOD NEUROLOGICAL BOWEL NUTRITION STATUS      Continent Diet(Cardiac)  AMBULATORY STATUS COMMUNICATION OF NEEDS Skin   Extensive Assist Verbally Surgical wounds(R knee surgical incision)                       Personal Care Assistance Level of Assistance  Bathing, Feeding, Dressing Bathing Assistance: Maximum assistance Feeding assistance: Independent Dressing Assistance: Maximum assistance     Functional Limitations Info  Sight, Hearing, Speech Sight Info: Adequate Hearing Info: Adequate Speech Info: Adequate    SPECIAL CARE FACTORS FREQUENCY  PT (By licensed PT), OT (By licensed OT)     PT Frequency: 5x/week OT Frequency: 5x/week            Contractures Contractures Info: Not present    Additional Factors Info  Code Status, Allergies, Psychotropic Code Status Info: Full Allergies Info: OYSTERS SHELLFISH ALLERGY, CODEINE  Psychotropic Info: Lexapro         Current Medications (05/06/2018):  This is the current hospital active medication list Current Facility-Administered Medications  Medication Dose Route Frequency Provider Last Rate Last Dose  . acetaminophen (TYLENOL) tablet 325-650 mg  325-650 mg Oral Q6H PRN Swinteck, Aaron Edelman, MD      . bisacodyl (DULCOLAX) EC tablet 5 mg  5 mg Oral Daily PRN Georgette Shell, MD      . docusate sodium (COLACE) capsule 100  mg  100 mg Oral BID Rod Can, MD   100 mg at 05/06/18 1024  . enoxaparin (LOVENOX) injection 40 mg  40 mg Subcutaneous Q24H Rod Can, MD   40 mg at 05/06/18 1025  . escitalopram (LEXAPRO) tablet 20 mg  20 mg Oral Daily Rod Can, MD   20 mg at 05/06/18 1024  . HYDROcodone-acetaminophen (NORCO) 7.5-325 MG per tablet 1-2 tablet  1-2 tablet Oral Q4H PRN  Rod Can, MD   1 tablet at 05/06/18 0525  . HYDROcodone-acetaminophen (NORCO/VICODIN) 5-325 MG per tablet 1-2 tablet  1-2 tablet Oral Q4H PRN Rod Can, MD   2 tablet at 05/04/18 1830  . menthol-cetylpyridinium (CEPACOL) lozenge 3 mg  1 lozenge Oral PRN Swinteck, Aaron Edelman, MD       Or  . phenol (CHLORASEPTIC) mouth spray 1 spray  1 spray Mouth/Throat PRN Swinteck, Aaron Edelman, MD      . methocarbamol (ROBAXIN) tablet 500 mg  500 mg Oral Q6H PRN Rod Can, MD   500 mg at 05/04/18 1830   Or  . methocarbamol (ROBAXIN) 500 mg in dextrose 5 % 50 mL IVPB  500 mg Intravenous Q6H PRN Swinteck, Aaron Edelman, MD      . morphine 2 MG/ML injection 0.5-1 mg  0.5-1 mg Intravenous Q2H PRN Swinteck, Aaron Edelman, MD      . ondansetron (ZOFRAN) tablet 4 mg  4 mg Oral Q6H PRN Swinteck, Aaron Edelman, MD       Or  . ondansetron (ZOFRAN) injection 4 mg  4 mg Intravenous Q6H PRN Swinteck, Aaron Edelman, MD      . senna (SENOKOT) tablet 8.6 mg  1 tablet Oral BID Rod Can, MD   8.6 mg at 05/06/18 1024  . simvastatin (ZOCOR) tablet 20 mg  20 mg Oral Daily Rod Can, MD   20 mg at 05/06/18 1024     Discharge Medications: Please see discharge summary for a list of discharge medications.  Relevant Imaging Results:  Relevant Lab Results:   Additional Information SSN: [id number]  Pricilla Holm, [place]

## 2018-05-06 NOTE — Progress Notes (Signed)
Progress Note  Patient Name: [name] Date of Encounter: 05/06/2018  Primary Cardiologist: Sanda Klein, MD    Patient Profile      [name] is a 84 y.o. female with a history of orthostatic hypotension, hyperlipidemia, and chronic lower extremity edema secondary to venous insufficiency  being seen  for pre-operative clearance for a femur fracture    Neighbor of MCr and friend of GT/PR   Subjective   Mild leg pain.  No chest pain or shortness of breath.  Overall just miserable.  Inpatient Medications    Scheduled Meds: . docusate sodium  100 mg Oral BID  . enoxaparin (LOVENOX) injection  40 mg Subcutaneous Q24H  . escitalopram  20 mg Oral Daily  . senna  1 tablet Oral BID  . simvastatin  20 mg Oral Daily   Continuous Infusions: . methocarbamol (ROBAXIN) IV     PRN Meds: acetaminophen, bisacodyl, HYDROcodone-acetaminophen, HYDROcodone-acetaminophen, menthol-cetylpyridinium **OR** phenol, methocarbamol **OR** methocarbamol (ROBAXIN) IV, morphine injection, ondansetron **OR** ondansetron (ZOFRAN) IV   Vital Signs    Vitals:   05/05/18 1308 05/05/18 1837 05/05/18 2113 05/06/18 0614  BP: (!) 105/52 (!) 116/56 (!) 143/48 (!) 96/48  Pulse: (!) 43 77 (!) 54 (!) 40  Resp: 14 20 19 18   Temp: 98.8 F (37.1 C) 97.6 F (36.4 C) 98.3 F (36.8 C) 98.5 F (36.9 C)  TempSrc: Oral Oral Oral Oral  SpO2: 90% 90% 93% 94%  Weight:      Height:        Intake/Output Summary (Last 24 hours) at 05/06/2018 0844 Last data filed at 05/06/2018 0600 Gross per 24 hour  Intake 240 ml  Output 1900 ml  Net -1660 ml   Filed Weights   05/02/18 1752 05/03/18 0000 05/03/18 1450  Weight: 70.3 kg 74 kg 74 kg    Telemetry    Sinus bradycardia with frequent PVCs PACs some blocked PACs personally Reviewed  ECG      Physical Exam  Well developed and nourished in no acute distress HENT normal Neck supple with JVP-flat Clear Regular rate and rhythm, no murmurs or  gallops Abd-soft with active BS No Clubbing cyanosis edema Skin-warm and dry A & Oriented  Grossly normal sensory and motor function   Labs    Chemistry Recent Labs  Lab 05/03/18 0600 05/04/18 0547 05/05/18 0553 05/06/18 0522  NA 139 142 138 138  K 3.3* 3.9 4.3 3.8  CL 103 104 101 101  CO2 26 30 29 27   GLUCOSE 91 142* 119* 106*  BUN 22 18 17 13   CREATININE 0.64 0.71 0.57 0.54  CALCIUM 9.0 8.9 8.6* 8.5*  ALBUMIN 3.8  --   --   --   GFRNONAA >60 >60 >60 >60  GFRAA >60 >60 >60 >60  ANIONGAP 10 8 8 10      Hematology Recent Labs  Lab 05/04/18 0547 05/05/18 0553 05/06/18 0522  WBC 7.8 8.0 7.8  RBC 3.77* 3.39* 3.34*  HGB 11.8* 10.8* 10.6*  HCT 37.7 34.5* 33.3*  MCV 100.0 101.8* 99.7  MCH 31.3 31.9 31.7  MCHC 31.3 31.3 31.8  RDW 13.0 13.2 13.0  PLT 174 137* 153    Cardiac Enzymes Recent Labs  Lab 05/02/18 2246 05/03/18 0600 05/03/18 0942  TROPONINI <0.03 <0.03 <0.03   No results for input(s): TROPIPOC in the last 168 hours.   BNPNo results for input(s): BNP, PROBNP in the last 168 hours.   DDimer No results for input(s): DDIMER in the  last 168 hours.   Radiology      Cardiac Studies   Echo 05/03/18 ------------------------------------------------------------------- Study Conclusions  - Left ventricle: The cavity size was normal. There was mild focal   basal hypertrophy of the septum. Systolic function was normal.   The estimated ejection fraction was in the range of 60% to 65%.   Wall motion was normal; there were no regional wall motion   abnormalities. Doppler parameters are consistent with abnormal   left ventricular relaxation (grade 1 diastolic dysfunction). - Aortic valve: Mildly calcified annulus. Trileaflet; normal   thickness leaflets. There was trivial regurgitation. - Mitral valve: Calcified annulus. There was mild regurgitation. - Left atrium: The atrium was moderately dilated. - Pulmonary arteries: Systolic pressure was mildly  increased. PA   peak pressure: 38 mm Hg (S).   Assessment & Plan    Femur fracture  Orthostatic hypotension-history  Ectopy-PACs/PVCs  Sinus brady   Not up out of bed.  Would continue to keep the bed in reverse Trendelenburg.  Not sure if the ectopy her sinus bradycardia is going to have an impact on functional status that she tries to get up.  We will need to follow this.     For questions or updates, please contact [place] Please consult [email or web address] for contact info under        Signed, Virl Axe, MD  05/06/2018, 8:44 AM

## 2018-05-07 ENCOUNTER — Inpatient Hospital Stay (HOSPITAL_COMMUNITY): Payer: Medicare Other

## 2018-05-07 DIAGNOSIS — R001 Bradycardia, unspecified: Secondary | ICD-10-CM

## 2018-05-07 LAB — BASIC METABOLIC PANEL
Anion gap: 10 (ref 5–15)
BUN: 16 mg/dL (ref 8–23)
CHLORIDE: 96 mmol/L — AB (ref 98–111)
CO2: 29 mmol/L (ref 22–32)
Calcium: 8.5 mg/dL — ABNORMAL LOW (ref 8.9–10.3)
Creatinine, Ser: 0.64 mg/dL (ref 0.44–1.00)
GFR calc Af Amer: >60 mL/min (ref >60)
GFR calc non Af Amer: >60 mL/min (ref >60)
Glucose, Bld: 102 mg/dL — ABNORMAL HIGH (ref 70–99)
POTASSIUM: 3.8 mmol/L (ref 3.5–5.1)
Sodium: 135 mmol/L (ref 135–145)

## 2018-05-07 LAB — CBC
HEMATOCRIT: 31.6 % — AB (ref 36.0–46.0)
HEMOGLOBIN: 10.2 g/dL — AB (ref 12.0–15.0)
MCH: 31.8 pg (ref 26.0–34.0)
MCHC: 32.3 g/dL (ref 30.0–36.0)
MCV: 98.4 fL (ref 80.0–100.0)
Platelets: 148 10*3/uL — ABNORMAL LOW (ref 150–400)
RBC: 3.21 MIL/uL — ABNORMAL LOW (ref 3.87–5.11)
RDW: 13 % (ref 11.5–15.5)
WBC: 6.6 10*3/uL (ref 4.0–10.5)
nRBC: 0 % (ref 0.0–0.2)

## 2018-05-07 NOTE — Progress Notes (Addendum)
Physical Therapy Treatment Patient Details Name: [name] MRN: [id number] DOB: 05/13/1932 Today's Date: 05/07/2018    History of Present Illness 84 YO female who fell on RLE and was admitted to ED on 10/9. PT s/p IM nail fixation of R periprosthetic fracture on 05/03/18. PMH includes arthritis, breast cancer, depression, DVT, high cholesterol, history of alcoholism, HTN, OP, PVD, spinal stenosis, laminectomy/decompression of lumbar spine, posterior lumbar fusion L4-L5.     PT Comments    Pt limited this session by symptomatic orthostatic hypotension (orthostatics in flowsheets and mobility section of note), RLE pain, and unsteadiness upon standing. Pt requiring mod assist +2 for standing and steadying at this time. PT to continue to follow and progress mobility as able.   Pt's O2sats at 91% on 2LO2 via [place] at start of session. Pt remained on O2 during session. Following PT, pt with O2sat 89-91% on 2LO2 via [place].     Follow Up Recommendations  SNF     Equipment Recommendations  None recommended by PT    Recommendations for Other Services       Precautions / Restrictions Precautions Precautions: Fall Restrictions Weight Bearing Restrictions: Yes RLE Weight Bearing: Touchdown weight bearing RLE Partial Weight Bearing Percentage or Pounds: Op note states TDWB, orders state 30% WB. Treated and educated pt conservatively with TDWB.     Mobility  Bed Mobility Overal bed mobility: Needs Assistance Bed Mobility: Supine to Sit;Sit to Supine     Supine to sit: Min assist Sit to supine: Mod assist;+2 for physical assistance   General bed mobility comments: BP and HR in supine 106/53, 41 bpm and pt asymptomatic.  Assist for LE management, trunk elevation, scooting to EOB. Increased time and effort, limited by pain. Pt with posterior trunk leaning, tactile cues to correct. Pt with reports of feeling nauseous at EOB, BP and HR 112/54 and 45 bpm. Nausea decreased after approximately 1  minute sitting EOB. Sat EOB and performed LE exercise. BP and HR after this was 117/65 and 60 bpm.   Transfers Overall transfer level: Needs assistance Equipment used: Rolling walker (2 wheeled) Transfers: Sit to/from Stand   Stand pivot transfers: Mod assist;+2 physical assistance;+2 safety/equipment       General transfer comment: Pt motivated to stand. Assist for trunk elevation, reinforcing precautions, hip extension, and steadying upon standing. Verbal cuing for hand placement. Pt with unsteadiness and pulling RW wheels off the ground, RW placed and steadied by PT. Pt with mild nausea again, BP and HR 123/50 and 87 bpm. PT encouraged pt to sit given BP. Pt returned to supine positioning in bed after sitting EOB for a 4-5 minutes.   Ambulation/Gait Ambulation/Gait assistance: (not appropriate given BP )               Stairs             Wheelchair Mobility    Modified Rankin (Stroke Patients Only)       Balance Overall balance assessment: Needs assistance Sitting-balance support: No upper extremity supported;Feet supported Sitting balance-Leahy Scale: Fair     Standing balance support: Bilateral upper extremity supported;During functional activity Standing balance-Leahy Scale: Poor Standing balance comment: Relies heavily on RW and PT support, pt with posterior leaning and RW rocking.                             Cognition Arousal/Alertness: Awake/alert Behavior During Therapy: Anxious;WFL for tasks assessed/performed Overall Cognitive Status:  Within Functional Limits for tasks assessed                                        Exercises General Exercises - Lower Extremity Ankle Circles/Pumps: AROM;Both;5 reps;Supine Long Arc Quad: AAROM;Right;10 reps;Seated    General Comments        Pertinent Vitals/Pain Pain Assessment: Faces Faces Pain Scale: Hurts little more Pain Location: R knee and thigh, with bed mobility  Pain  Descriptors / Indicators: Sore Pain Intervention(s): Repositioned;Limited activity within patient's tolerance;Ice applied;Monitored during session;Premedicated before session    Home Living                      Prior Function            PT Goals (current goals can now be found in the care plan section) Acute Rehab PT Goals PT Goal Formulation: With patient Time For Goal Achievement: 05/18/18 Potential to Achieve Goals: Good Progress towards PT goals: Progressing toward goals    Frequency    Min 5X/week      PT Plan Current plan remains appropriate    Co-evaluation              AM-PAC PT "6 Clicks" Daily Activity  Outcome Measure  Difficulty turning over in bed (including adjusting bedclothes, sheets and blankets)?: Unable Difficulty moving from lying on back to sitting on the side of the bed? : Unable Difficulty sitting down on and standing up from a chair with arms (e.g., wheelchair, bedside commode, etc,.)?: Unable Help needed moving to and from a bed to chair (including a wheelchair)?: A Lot Help needed walking in hospital room?: A Lot Help needed climbing 3-5 steps with a railing? : Total 6 Click Score: 8    End of Session Equipment Utilized During Treatment: Gait belt Activity Tolerance: Patient limited by pain;Patient limited by fatigue;Other (comment)(limited by orthostatic hypotension ) Patient left: with call bell/phone within reach;with SCD's reapplied;in bed;with bed alarm set;with family/visitor present Nurse Communication: Mobility status PT Visit Diagnosis: History of falling (Z91.81);Unsteadiness on feet (R26.81)     Time: 3335-4562 PT Time Calculation (min) (ACUTE ONLY): 31 min  Charges:  $Therapeutic Activity: 23-37 mins                     Julien Girt, PT Acute Rehabilitation Services Pager [phone]  Office [phone]    [name] 05/07/2018, 2:42 PM

## 2018-05-07 NOTE — Care Management Important Message (Signed)
Important Message  Patient Details  Name: KAELEN BRENNAN MRN: [id number] Date of Birth: 03-17-1932   Medicare Important Message Given:  Yes    Kerin Salen 05/07/2018, 1:44 PMImportant Message  Patient Details  Name: ARNETIA BRONK MRN: [id number] Date of Birth: 04-May-1932   Medicare Important Message Given:  Yes    Kerin Salen 05/07/2018, 1:44 PM

## 2018-05-07 NOTE — Progress Notes (Signed)
CSW following to assist with discharge planning to SNF. Per chart review, patient is interested in [facility]. CSW initiated patient's insurance authorization Nurse, mental health Medicare). CSW will continue to follow and assist with discharge planning.   Abundio Miu, [place] Social Worker [facility] Cell#: [phone]

## 2018-05-07 NOTE — Plan of Care (Signed)
Pt still on bedrest d/t low BP. Pt unable to eat dinner. Pt states pain has decreased and she is making an effort to decrease her demand for pain meds.

## 2018-05-07 NOTE — Progress Notes (Signed)
New order written to discontinue foley catheter . RN paged MD and discussed that foley catheter   was removed on Friday and due to acute urinary  retention foley was reinserted. Pt also has been placed on bedrest due to low blood pressure and bradycardia during the weekend. At this time will leave foley cathter in. Will discuss with Cardiology regarding activity level.

## 2018-05-07 NOTE — Progress Notes (Addendum)
Progress Note  Patient Name: [name] Date of Encounter: 05/07/2018  Primary Cardiologist: Sanda Klein, MD   Subjective   Doing ok. No cardiac complaints. Denies palpitations, dyspnea, CP, dizziness, syncope/ near syncope. Mild post operative leg pain. Main complaint today is constipation from pain meds.   Inpatient Medications    Scheduled Meds: . docusate sodium  100 mg Oral BID  . enoxaparin (LOVENOX) injection  40 mg Subcutaneous Q24H  . escitalopram  20 mg Oral Daily  . senna  1 tablet Oral BID  . simvastatin  20 mg Oral Daily   Continuous Infusions: . methocarbamol (ROBAXIN) IV     PRN Meds: acetaminophen, bisacodyl, HYDROcodone-acetaminophen, HYDROcodone-acetaminophen, menthol-cetylpyridinium **OR** phenol, methocarbamol **OR** methocarbamol (ROBAXIN) IV, morphine injection, ondansetron **OR** ondansetron (ZOFRAN) IV   Vital Signs    Vitals:   05/06/18 0614 05/06/18 1312 05/06/18 2119 05/07/18 0526  BP: (!) 96/48 130/86 (!) 116/46 109/64  Pulse: (!) 40 85 79 (!) 56  Resp: 18 18 18 16   Temp: 98.5 F (36.9 C) 98.9 F (37.2 C) 99.5 F (37.5 C) 98.8 F (37.1 C)  TempSrc: Oral Oral Oral Oral  SpO2: 94% 93% 95% 93%  Weight:      Height:        Intake/Output Summary (Last 24 hours) at 05/07/2018 1031 Last data filed at 05/07/2018 0900 Gross per 24 hour  Intake 240 ml  Output 1351 ml  Net -1111 ml   Filed Weights   05/02/18 1752 05/03/18 0000 05/03/18 1450  Weight: 70.3 kg 74 kg 74 kg    Telemetry    Currently NSR, frequent ectopy noted, PVCs and PACs, also some occasional sinus brady in the mid 40s but asymtomatic - Personally Reviewed  ECG    SR w/ PVCS - Personally Reviewed  Physical Exam   GEN: elderly WF in No acute distress.   Neck: No JVD Cardiac: RRR, no murmurs, rubs, or gallops.  Respiratory: Clear to auscultation bilaterally. GI: Soft, nontender, non-distended  MS: right LE wrapped in ace bandages, slight right sided pedal  edema, no edema on the left Neuro:  Nonfocal  Psych: Normal affect   Labs    Chemistry Recent Labs  Lab 05/03/18 0600  05/05/18 0553 05/06/18 0522 05/07/18 0540  NA 139   < > 138 138 135  K 3.3*   < > 4.3 3.8 3.8  CL 103   < > 101 101 96*  CO2 26   < > 29 27 29   GLUCOSE 91   < > 119* 106* 102*  BUN 22   < > 17 13 16   CREATININE 0.64   < > 0.57 0.54 0.64  CALCIUM 9.0   < > 8.6* 8.5* 8.5*  ALBUMIN 3.8  --   --   --   --   GFRNONAA >60   < > >60 >60 >60  GFRAA >60   < > >60 >60 >60  ANIONGAP 10   < > 8 10 10    < > = values in this interval not displayed.     Hematology Recent Labs  Lab 05/05/18 0553 05/06/18 0522 05/07/18 0540  WBC 8.0 7.8 6.6  RBC 3.39* 3.34* 3.21*  HGB 10.8* 10.6* 10.2*  HCT 34.5* 33.3* 31.6*  MCV 101.8* 99.7 98.4  MCH 31.9 31.7 31.8  MCHC 31.3 31.8 32.3  RDW 13.2 13.0 13.0  PLT 137* 153 148*    Cardiac Enzymes Recent Labs  Lab 05/02/18 2246 05/03/18 0600 05/03/18 0942  TROPONINI <0.03 <0.03 <0.03   No results for input(s): TROPIPOC in the last 168 hours.   BNPNo results for input(s): BNP, PROBNP in the last 168 hours.   DDimer No results for input(s): DDIMER in the last 168 hours.   Radiology    No results found.  Cardiac Studies   2D Echo 05/03/18 ------------------------------------------------------------------- Study Conclusions  - Left ventricle: The cavity size was normal. There was mild focal basal hypertrophy of the septum. Systolic function was normal. The estimated ejection fraction was in the range of 60% to 65%. Wall motion was normal; there were no regional wall motion abnormalities. Doppler parameters are consistent with abnormal left ventricular relaxation (grade 1 diastolic dysfunction). - Aortic valve: Mildly calcified annulus. Trileaflet; normal thickness leaflets. There was trivial regurgitation. - Mitral valve: Calcified annulus. There was mild regurgitation. - Left atrium: The atrium was  moderately dilated. - Pulmonary arteries: Systolic pressure was mildly increased. PA peak pressure: 38 mm Hg (S).    Patient Profile     [name] a 84 y.o.femalewith a history of orthostatic hypotension, hyperlipidemia, and chronic lower extremity edema secondary to venous insufficiency, admitted for femur fracture. Cardiology initially consulted for preoperative clearance. Pt cleared and underwent successful surgery. Post op recovery complicated by hypotension and bradycardia.   Assessment & Plan    1. Femur Fracture: S/p right retrograde intramedullary nail fixation. Post op day #2. Management per ortho. Plan is for SNF for rehab prior to transition home.   2. H/o Orthostatic Hypotension: BP during vital sign assessments have been stable. Currently not on any antihypertensives. Monitor. Needs to be careful with ambulation.   3. Sinus Bradycardia: occasional rates in the 40s on tele, but asymptomatic. Current rates in the 70s. Avoid AV nodal blocking agents.   4. Ectopy: pt wore holter monitor prior to admit that showed PACs and PVCs. Currently NSR, frequent ectopy noted on tele, PVCs and PACs, also some occasional sinus brady in the mid 40s but asymptomatic. Pt was placed on bed rest all day yesterday. I think it is reasonable to progress with PT today. Can try transitioning out of bed and to chair, if ok with ortho, and see how she does. Would use caution with ambulation given h/o orthostatic hypotension. Continue to monitor on tele.   MD to follow with further recs.   For questions or updates, please contact [place] Please consult [email or web address] for contact info under        Signed, Lyda Jester, PA-C  05/07/2018, 10:31 AM    As above, patient seen and examined.  She denies chest pain or dyspnea.  I have personally reviewed the patient's telemetry.  She has sinus to sinus bradycardia with rare junctional escape beat.  There are PACs and PVCs noted.  Patient  can begin rehabilitation from a cardiac standpoint.  She will need assistance given history of orthostasis.  She can follow-up with Dr. Sallyanne Kuster following discharge. [facility] will sign off.   Medication Recommendations: Would resume preadmission medications at discharge. Other recommendations (labs, testing, etc): No other recommendations. Follow up as an outpatient: Follow-up Dr. Sallyanne Kuster 8 to 12 weeks following discharge. Kirk Ruths, MD

## 2018-05-07 NOTE — Progress Notes (Signed)
PROGRESS NOTE    [name]  GYK:[id number] DOB: March 23, 1932 DOA: 05/02/2018 PCP: Burnard Bunting, MD  Brief Narrative:84 y.o.femalewith medical history significant of orthostatic hypotension, hyperlipidemia and lower extremity edema due to venous insufficiency,Left Breast Cancer20 years ago   Presented witha fall she was trying to saw a pair of pants. No head injury. Resulting in rotation of right leg no LOC she is not sure how she fell she had to crawl on the floor for an hour in order to get to the phone to call EMS not on any blood thinners EMS arrived administered 100 mcg of fentanyl.  She denies shortness of breath or chest pain she goes to the Gym twice no exertional chest pain. Able to walk up a flight of stairs. Regarding pertinent Chronic problems:Orthostatic hypotension and leg edema due to venous stasis followed by cardiology wears compression stockings  hx of Rightartificial knee had a total knee replacement3 years ago    March 2016, her echocardiogram showed normal findings with a session of moderate left atrial enlargement and mild diastolic left ventricular dysfunction Echocardiogram 05/03/2018 normal cavity size,There was mild focal basal hypertrophy of the septum. Systolic function was normal. The estimated ejection fraction was in the range of 60% to 65%. Wall motion was normal; there were no regional wall motion abnormalities. Doppler parameters are consistent with abnormal left ventricular relaxation (grade 1 diastolic dysfunction). - Aortic valve: Mildly calcified annulus. Trileaflet; normal thickness leaflets. There was trivial regurgitation. - Mitral valve: Calcified annulus. There was mild regurgitation. - Left atrium: The atrium was moderately dilated. - Pulmonary arteries: Systolic pressure was mildly increased. PA peak pressure: 38 mm Hg (   Assessment & Plan:   Active Problems:   Essential hypertension, benign   Orthostatic hypotension   Diastolic dysfunction without heart failure   Femur fracture, right (HCC)   Prolonged QT interval   Bigeminy   Pain   Displaced supracondylar fracture of distal end of right femur without intracondylar extension ([place])   Postop check  #1 right femur fracture status post fall status post right retrograde intramedullary nail fixation. Continue pain control bowel softeners DVT prophylaxis with Lovenox social worker consult for SNF placement PT evaluationrecommends SNF.  #2 history of hypertension with orthostatic hypotension and bradycardia- has been on bed rest  #3hyperlipidemia continue statin  #4 constipation -continue stool softners.foley placed due to urine retention.    DVT prophylaxis lovenox Code Status:full Family Communication none Disposition Plan: tbd Consultants: ortho  Procedures:right femur intramedullary fixation Antimicrobials:  none Subjective:patient resting in bed foley in,denies chest pain sob nausea vomitting   Objective: Vitals:   05/06/18 0614 05/06/18 1312 05/06/18 2119 05/07/18 0526  BP: (!) 96/48 130/86 (!) 116/46 109/64  Pulse: (!) 40 85 79 (!) 56  Resp: 18 18 18 16   Temp: 98.5 F (36.9 C) 98.9 F (37.2 C) 99.5 F (37.5 C) 98.8 F (37.1 C)  TempSrc: Oral Oral Oral Oral  SpO2: 94% 93% 95% 93%  Weight:      Height:        Intake/Output Summary (Last 24 hours) at 05/07/2018 1124 Last data filed at 05/07/2018 0900 Gross per 24 hour  Intake 240 ml  Output 1351 ml  Net -1111 ml   Filed Weights   05/02/18 1752 05/03/18 0000 05/03/18 1450  Weight: 70.3 kg 74 kg 74 kg    Examination:  General exam: Appears calm and comfortable  Respiratory system: Clear to auscultation. Respiratory effort normal. Cardiovascular system: S1 & S2  heard, RRR. No JVD, murmurs, rubs, gallops or clicks. No pedal edema. Gastrointestinal system: Abdomen is nondistended, soft and nontender. No organomegaly or masses felt. Normal  bowel sounds heard. Central nervous system: Alert and oriented. No focal neurological deficits. Extremities: Symmetric 5 x 5 power. Skin: No rashes, lesions or ulcers Psychiatry: Judgement and insight appear normal. Mood & affect appropriate.     Data Reviewed: I have personally reviewed following labs and imaging studies  CBC: Recent Labs  Lab 05/02/18 1808 05/03/18 0600 05/04/18 0547 05/05/18 0553 05/06/18 0522 05/07/18 0540  WBC 8.2 7.5 7.8 8.0 7.8 6.6  NEUTROABS 6.0  --   --   --   --   --   HGB 13.4 12.9 11.8* 10.8* 10.6* 10.2*  HCT 41.5 41.8 37.7 34.5* 33.3* 31.6*  MCV 97.4 101.7* 100.0 101.8* 99.7 98.4  PLT 192 177 174 137* 153 678*   Basic Metabolic Panel: Recent Labs  Lab 05/02/18 2246 05/03/18 0600 05/04/18 0547 05/05/18 0553 05/06/18 0522 05/07/18 0540  NA  --  139 142 138 138 135  K  --  3.3* 3.9 4.3 3.8 3.8  CL  --  103 104 101 101 96*  CO2  --  26 30 29 27 29   GLUCOSE  --  91 142* 119* 106* 102*  BUN  --  22 18 17 13 16   CREATININE  --  0.64 0.71 0.57 0.54 0.64  CALCIUM  --  9.0 8.9 8.6* 8.5* 8.5*  MG 1.7  --  1.9  --   --   --   PHOS 4.0  --   --   --   --   --    GFR: Estimated Creatinine Clearance: 55.4 mL/min (by C-G formula based on SCr of 0.64 mg/dL). Liver Function Tests: Recent Labs  Lab 05/03/18 0600  ALBUMIN 3.8   No results for input(s): LIPASE, AMYLASE in the last 168 hours. No results for input(s): AMMONIA in the last 168 hours. Coagulation Profile: Recent Labs  Lab 05/02/18 1808  INR 0.88   Cardiac Enzymes: Recent Labs  Lab 05/02/18 2246 05/03/18 0600 05/03/18 0942  TROPONINI <0.03 <0.03 <0.03   BNP (last 3 results) No results for input(s): PROBNP in the last 8760 hours. HbA1C: No results for input(s): HGBA1C in the last 72 hours. CBG: No results for input(s): GLUCAP in the last 168 hours. Lipid Profile: No results for input(s): CHOL, HDL, LDLCALC, TRIG, CHOLHDL, LDLDIRECT in the last 72 hours. Thyroid Function  Tests: No results for input(s): TSH, T4TOTAL, FREET4, T3FREE, THYROIDAB in the last 72 hours. Anemia Panel: No results for input(s): VITAMINB12, FOLATE, FERRITIN, TIBC, IRON, RETICCTPCT in the last 72 hours. Sepsis Labs: No results for input(s): PROCALCITON, LATICACIDVEN in the last 168 hours.  Recent Results (from the past 240 hour(s))  Surgical PCR screen     Status: Abnormal   Collection Time: 05/03/18 12:29 AM  Result Value Ref Range Status   MRSA, PCR NEGATIVE NEGATIVE Final   Staphylococcus aureus POSITIVE (A) NEGATIVE Final    Comment: (NOTE) The Xpert SA Assay (FDA approved for NASAL specimens in patients 12 years of age and older), is one component of a comprehensive surveillance program. It is not intended to diagnose infection nor to guide or monitor treatment. Performed at [facility], [place] [address], [place], [place] [address]          Radiology Studies: No results found.      Scheduled Meds: . docusate sodium  100 mg Oral BID  . enoxaparin (LOVENOX) injection  40 mg Subcutaneous Q24H  . escitalopram  20 mg Oral Daily  . senna  1 tablet Oral BID  . simvastatin  20 mg Oral Daily   Continuous Infusions: . methocarbamol (ROBAXIN) IV       LOS: 5 days     Georgette Shell, MD [facility]  If 7PM-7AM, please contact night-coverage [email or web address] Password [email or web address] 05/07/2018, 11:24 AM

## 2018-05-08 ENCOUNTER — Inpatient Hospital Stay (HOSPITAL_COMMUNITY): Payer: Medicare Other

## 2018-05-08 DIAGNOSIS — W19XXXA Unspecified fall, initial encounter: Secondary | ICD-10-CM | POA: Diagnosis not present

## 2018-05-08 DIAGNOSIS — I951 Orthostatic hypotension: Secondary | ICD-10-CM | POA: Diagnosis not present

## 2018-05-08 DIAGNOSIS — Z743 Need for continuous supervision: Secondary | ICD-10-CM | POA: Diagnosis not present

## 2018-05-08 DIAGNOSIS — S7291XD Unspecified fracture of right femur, subsequent encounter for closed fracture with routine healing: Secondary | ICD-10-CM | POA: Diagnosis not present

## 2018-05-08 DIAGNOSIS — R262 Difficulty in walking, not elsewhere classified: Secondary | ICD-10-CM | POA: Diagnosis not present

## 2018-05-08 DIAGNOSIS — S72454D Nondisplaced supracondylar fracture without intracondylar extension of lower end of right femur, subsequent encounter for closed fracture with routine healing: Secondary | ICD-10-CM | POA: Diagnosis not present

## 2018-05-08 DIAGNOSIS — R279 Unspecified lack of coordination: Secondary | ICD-10-CM | POA: Diagnosis not present

## 2018-05-08 DIAGNOSIS — E7800 Pure hypercholesterolemia, unspecified: Secondary | ICD-10-CM | POA: Diagnosis not present

## 2018-05-08 DIAGNOSIS — R001 Bradycardia, unspecified: Secondary | ICD-10-CM | POA: Diagnosis not present

## 2018-05-08 DIAGNOSIS — E785 Hyperlipidemia, unspecified: Secondary | ICD-10-CM | POA: Diagnosis not present

## 2018-05-08 DIAGNOSIS — R0989 Other specified symptoms and signs involving the circulatory and respiratory systems: Secondary | ICD-10-CM | POA: Diagnosis not present

## 2018-05-08 DIAGNOSIS — M9711XA Periprosthetic fracture around internal prosthetic right knee joint, initial encounter: Secondary | ICD-10-CM | POA: Diagnosis not present

## 2018-05-08 DIAGNOSIS — I519 Heart disease, unspecified: Secondary | ICD-10-CM | POA: Diagnosis not present

## 2018-05-08 DIAGNOSIS — S72451S Displaced supracondylar fracture without intracondylar extension of lower end of right femur, sequela: Secondary | ICD-10-CM | POA: Diagnosis not present

## 2018-05-08 DIAGNOSIS — F339 Major depressive disorder, recurrent, unspecified: Secondary | ICD-10-CM | POA: Diagnosis not present

## 2018-05-08 DIAGNOSIS — R339 Retention of urine, unspecified: Secondary | ICD-10-CM | POA: Diagnosis not present

## 2018-05-08 DIAGNOSIS — S72401A Unspecified fracture of lower end of right femur, initial encounter for closed fracture: Secondary | ICD-10-CM | POA: Diagnosis not present

## 2018-05-08 DIAGNOSIS — M6281 Muscle weakness (generalized): Secondary | ICD-10-CM | POA: Diagnosis not present

## 2018-05-08 DIAGNOSIS — R2689 Other abnormalities of gait and mobility: Secondary | ICD-10-CM | POA: Diagnosis not present

## 2018-05-08 DIAGNOSIS — I499 Cardiac arrhythmia, unspecified: Secondary | ICD-10-CM | POA: Diagnosis not present

## 2018-05-08 DIAGNOSIS — I498 Other specified cardiac arrhythmias: Secondary | ICD-10-CM | POA: Diagnosis not present

## 2018-05-08 DIAGNOSIS — I10 Essential (primary) hypertension: Secondary | ICD-10-CM | POA: Diagnosis not present

## 2018-05-08 DIAGNOSIS — R52 Pain, unspecified: Secondary | ICD-10-CM | POA: Diagnosis not present

## 2018-05-08 DIAGNOSIS — F329 Major depressive disorder, single episode, unspecified: Secondary | ICD-10-CM | POA: Diagnosis not present

## 2018-05-08 DIAGNOSIS — C50919 Malignant neoplasm of unspecified site of unspecified female breast: Secondary | ICD-10-CM | POA: Diagnosis not present

## 2018-05-08 DIAGNOSIS — J810 Acute pulmonary edema: Secondary | ICD-10-CM | POA: Diagnosis not present

## 2018-05-08 DIAGNOSIS — K649 Unspecified hemorrhoids: Secondary | ICD-10-CM | POA: Diagnosis not present

## 2018-05-08 DIAGNOSIS — I872 Venous insufficiency (chronic) (peripheral): Secondary | ICD-10-CM | POA: Diagnosis not present

## 2018-05-08 DIAGNOSIS — Z09 Encounter for follow-up examination after completed treatment for conditions other than malignant neoplasm: Secondary | ICD-10-CM | POA: Diagnosis not present

## 2018-05-08 DIAGNOSIS — I119 Hypertensive heart disease without heart failure: Secondary | ICD-10-CM | POA: Diagnosis not present

## 2018-05-08 DIAGNOSIS — R072 Precordial pain: Secondary | ICD-10-CM | POA: Diagnosis not present

## 2018-05-08 DIAGNOSIS — R0602 Shortness of breath: Secondary | ICD-10-CM | POA: Diagnosis not present

## 2018-05-08 DIAGNOSIS — K5900 Constipation, unspecified: Secondary | ICD-10-CM | POA: Diagnosis not present

## 2018-05-08 MED ORDER — BISACODYL 5 MG PO TBEC: 5.0000 mg | DELAYED_RELEASE_TABLET | Freq: Every day | ORAL | 0 refills | Status: DC | PRN

## 2018-05-08 MED ORDER — DOCUSATE SODIUM 100 MG PO CAPS: 100.0000 mg | ORAL_CAPSULE | Freq: Two times a day (BID) | ORAL | 0 refills | Status: DC

## 2018-05-08 MED ORDER — FUROSEMIDE 10 MG/ML IJ SOLN
20.0000 mg | Freq: Once | INTRAMUSCULAR | Status: AC
  Administered 2018-05-08: 20 mg via INTRAVENOUS
  Filled 2018-05-08: qty 2

## 2018-05-08 MED ORDER — ONDANSETRON HCL 4 MG PO TABS: 4.0000 mg | ORAL_TABLET | Freq: Four times a day (QID) | ORAL | 0 refills | Status: DC | PRN

## 2018-05-08 MED ORDER — POTASSIUM CHLORIDE ER 10 MEQ PO TBCR: 10.0000 meq | EXTENDED_RELEASE_TABLET | Freq: Every day | ORAL | 0 refills | Status: DC

## 2018-05-08 MED ORDER — HYDROCODONE-ACETAMINOPHEN 5-325 MG PO TABS: 1.0000 | ORAL_TABLET | Freq: Four times a day (QID) | ORAL | 0 refills | Status: DC | PRN

## 2018-05-08 MED ORDER — FUROSEMIDE 20 MG PO TABS: 20.0000 mg | ORAL_TABLET | Freq: Every day | ORAL | 0 refills | Status: DC | PRN

## 2018-05-08 MED ORDER — MAGNESIUM SULFATE 2 GM/50ML IV SOLN
2.0000 g | Freq: Once | INTRAVENOUS | Status: AC
  Administered 2018-05-08: 2 g via INTRAVENOUS
  Filled 2018-05-08: qty 50

## 2018-05-08 MED ORDER — SENNA 8.6 MG PO TABS: 1.0000 | ORAL_TABLET | Freq: Two times a day (BID) | ORAL | 0 refills | Status: DC

## 2018-05-08 MED ORDER — FUROSEMIDE 20 MG PO TABS: 20.0000 mg | ORAL_TABLET | Freq: Every day | ORAL | 11 refills | Status: DC

## 2018-05-08 MED ORDER — MENTHOL 3 MG MT LOZG: 1.0000 | LOZENGE | OROMUCOSAL | 12 refills | Status: DC | PRN

## 2018-05-08 MED ORDER — POTASSIUM CHLORIDE CRYS ER 20 MEQ PO TBCR
20.0000 meq | EXTENDED_RELEASE_TABLET | ORAL | Status: AC
  Administered 2018-05-08: 20 meq via ORAL
  Filled 2018-05-08: qty 1

## 2018-05-08 NOTE — Progress Notes (Signed)
Progress Note  Patient Name: [name] Date of Encounter: 05/08/2018  Primary Cardiologist: Sanda Klein, MD   Subjective   Denies CP or dyspnea; complains of leg discomfort; no dizziness  Inpatient Medications    Scheduled Meds: . docusate sodium  100 mg Oral BID  . enoxaparin (LOVENOX) injection  40 mg Subcutaneous Q24H  . escitalopram  20 mg Oral Daily  . furosemide  20 mg Intravenous Once  . potassium chloride  20 mEq Oral NOW  . senna  1 tablet Oral BID  . simvastatin  20 mg Oral Daily   Continuous Infusions: . magnesium sulfate 1 - 4 g bolus IVPB    . methocarbamol (ROBAXIN) IV     PRN Meds: acetaminophen, bisacodyl, HYDROcodone-acetaminophen, HYDROcodone-acetaminophen, menthol-cetylpyridinium **OR** phenol, methocarbamol **OR** methocarbamol (ROBAXIN) IV, morphine injection, ondansetron **OR** ondansetron (ZOFRAN) IV   Vital Signs    Vitals:   05/07/18 1251 05/07/18 2139 05/08/18 0531 05/08/18 1200  BP: 108/60 (!) 102/54 (!) 116/54 133/64  Pulse: 76 (!) 38 (!) 40 71  Resp: 18 18 18    Temp: 98 F (36.7 C) 98.6 F (37 C) 97.9 F (36.6 C)   TempSrc: Oral Oral Oral   SpO2: 95% 96% 94% 93%  Weight:      Height:        Intake/Output Summary (Last 24 hours) at 05/08/2018 1213 Last data filed at 05/08/2018 0530 Gross per 24 hour  Intake 240 ml  Output 1175 ml  Net -935 ml   Filed Weights   05/02/18 1752 05/03/18 0000 05/03/18 1450  Weight: 70.3 kg 74 kg 74 kg    Telemetry    Sinus with PVCs - Personally Reviewed  Physical Exam   GEN: NAD Neck: Supple Cardiac: RRR Respiratory: CTA GI: Soft, NT/ND MS: right LE wrapped in ace bandages, No edema Neuro:  Grossly intact   Labs    Chemistry Recent Labs  Lab 05/03/18 0600  05/05/18 0553 05/06/18 0522 05/07/18 0540  NA 139   < > 138 138 135  K 3.3*   < > 4.3 3.8 3.8  CL 103   < > 101 101 96*  CO2 26   < > 29 27 29   GLUCOSE 91   < > 119* 106* 102*  BUN 22   < > 17 13 16     CREATININE 0.64   < > 0.57 0.54 0.64  CALCIUM 9.0   < > 8.6* 8.5* 8.5*  ALBUMIN 3.8  --   --   --   --   GFRNONAA >60   < > >60 >60 >60  GFRAA >60   < > >60 >60 >60  ANIONGAP 10   < > 8 10 10    < > = values in this interval not displayed.     Hematology Recent Labs  Lab 05/05/18 0553 05/06/18 0522 05/07/18 0540  WBC 8.0 7.8 6.6  RBC 3.39* 3.34* 3.21*  HGB 10.8* 10.6* 10.2*  HCT 34.5* 33.3* 31.6*  MCV 101.8* 99.7 98.4  MCH 31.9 31.7 31.8  MCHC 31.3 31.8 32.3  RDW 13.2 13.0 13.0  PLT 137* 153 148*    Cardiac Enzymes Recent Labs  Lab 05/02/18 2246 05/03/18 0600 05/03/18 0942  TROPONINI <0.03 <0.03 <0.03    Radiology    Dg Chest 1 View  Result Date: 05/08/2018 CLINICAL DATA:  Hx. Bradycardia, sob EXAM: CHEST  1 VIEW COMPARISON:  05/02/2018 FINDINGS: Increase in interstitial edema or infiltrates with a predominant infrahilar distribution. Right  hilar prominence as before. Stable cardiomegaly. No definite effusion. Surgical clips in the left axilla.  Right shoulder DJD. IMPRESSION: Bilateral interstitial edema or infiltrates, slightly increased since prior study. Electronically Signed   By: Lucrezia Europe M.D.   On: 05/08/2018 10:05   Dg Abd 1 View  Result Date: 05/07/2018 CLINICAL DATA:  Diarrhea, abdominal pain EXAM: ABDOMEN - 1 VIEW COMPARISON:  05/05/2018 FINDINGS: Nonobstructive bowel gas pattern. Gas throughout sigmoid colon to rectum. No bowel dilatation, bowel wall thickening, or free air. Bones demineralized with prior vertebroplasties at L1, L2 and L4. IMPRESSION: Nonspecific bowel gas pattern. Electronically Signed   By: Lavonia Dana M.D.   On: 05/07/2018 12:52    Cardiac Studies   2D Echo 05/03/18 ------------------------------------------------------------------- Study Conclusions  - Left ventricle: The cavity size was normal. There was mild focal basal hypertrophy of the septum. Systolic function was normal. The estimated ejection fraction was in the  range of 60% to 65%. Wall motion was normal; there were no regional wall motion abnormalities. Doppler parameters are consistent with abnormal left ventricular relaxation (grade 1 diastolic dysfunction). - Aortic valve: Mildly calcified annulus. Trileaflet; normal thickness leaflets. There was trivial regurgitation. - Mitral valve: Calcified annulus. There was mild regurgitation. - Left atrium: The atrium was moderately dilated. - Pulmonary arteries: Systolic pressure was mildly increased. PA peak pressure: 38 mm Hg (S).    Patient Profile     [name] a 84 y.o.femalewith a history of orthostatic hypotension, hyperlipidemia, and chronic lower extremity edema secondary to venous insufficiency, admitted for femur fracture. Cardiology initially consulted for preoperative clearance. Pt cleared and underwent successful surgery. Post op recovery complicated by hypotension and bradycardia.   Assessment & Plan    1. Femur Fracture: per ortho.  Patient is for transfer to rehabilitation center today.  2. H/o Orthostatic Hypotension: Blood pressure is controlled on no medications.  Given history of orthostasis I would like to avoid triamterene hydrochlorothiazide.  Would allow blood pressure to run higher if needed.  3. Sinus Bradycardia: No significant bradycardia over the last 24 hours.  Avoid AV nodal blocking agents.  4.  Edema on chest x-ray: Patient is not volume overloaded on examination.  Can give Lasix 20 mg daily as needed for dyspnea or edema.  For questions or updates, please contact Catharine Please consult [email or web address] for contact info under        Signed, Kirk Ruths, MD  05/08/2018, 12:13 PM    [facility] will sign off.   Medication Recommendations: Lasix 20 mg daily as needed for worsening lower ext edema or dyspnea. Triamterene/HCTZ dced. Other recommendations (labs, testing, etc): No other recommendations. Follow up as an outpatient:  Follow-up Dr. Sallyanne Kuster 8 to 12 weeks following discharge. Kirk Ruths, MD

## 2018-05-08 NOTE — Clinical Social Work Placement (Signed)
CSW notified by patient's husband that they are no longer interested in [facility]. Patient's husband reported that they are interested in [facility] or [facility]. CSW followed up with preferred SNFs, [facility] (at capacity) [facility] (at capacity), patient received bed offer at [facility]. Patient's husband requested that CSW check with [facility] (Staff Alyse Low reported that they are at capacity), CSW updated patient's husband. Patient received and accepted bed offer at [facility]. Facility aware of patient's discharge and confirmed bed offer. Patient's RN can call report to [phone] Room 102, packet complete. CSW signing off, no other needs identified at this time. CSW signing off, no other needs identified at this time.  CLINICAL SOCIAL WORK PLACEMENT  NOTE  Date:  05/08/2018  Patient Details  Name: [name] MRN: [id number] Date of Birth: [birth year]  Clinical Social Work is seeking post-discharge placement for this patient at the [place] level of care (*CSW will initial, date and re-position this form in  chart as items are completed):  Yes   Patient/family provided with [facility] Work Department's list of facilities offering this level of care within the geographic area requested by the patient (or if unable, by the patient's family).  Yes   Patient/family informed of their freedom to choose among providers that offer the needed level of care, that participate in Medicare, Medicaid or managed care program needed by the patient, have an available bed and are willing to accept the patient.  Yes   Patient/family informed of [facility]'s ownership interest in [facility] and [facility], as well as of the fact that they are under no obligation to receive care at these facilities.  PASRR submitted to EDS on       PASRR number received on        Existing PASRR number confirmed on 05/06/18     FL2 transmitted to all facilities in geographic area requested by pt/family on 05/06/18     FL2 transmitted to all facilities within larger geographic area on       Patient informed that his/her managed care company has contracts with or will negotiate with certain facilities, including the following:        Yes   Patient/family informed of bed offers received.  Patient chooses bed at [facility]     Physician recommends and patient chooses bed at      Patient to be transferred to [facility] on 05/08/18.  Patient to be transferred to facility by [facility]     Patient family notified on 05/08/18 of transfer.  Name of family member notified:  Jamesetta Orleans (left voicemail)     PHYSICIAN       Additional Comment:    _______________________________________________ Burnis Medin, LCSW 05/08/2018, 3:07 PM

## 2018-05-08 NOTE — Progress Notes (Signed)
Physical Therapy Treatment Patient Details Name: [name] MRN: [id number] DOB: Mar 03, 1932 Today's Date: 05/08/2018    History of Present Illness 84 YO female who fell on RLE and was admitted to ED on 10/9. PT s/p IM nail fixation of R periprosthetic fracture on 05/03/18. PMH includes arthritis, breast cancer, depression, DVT, high cholesterol, history of alcoholism, HTN, OP, PVD, spinal stenosis, laminectomy/decompression of lumbar spine, posterior lumbar fusion L4-L5.     PT Comments    Pt with improved BP this session (see in mobility section, flow sheets, and below). Pt performed stand pivot to chair with mod assist from PT, requiring multiple verbal cues throughout session to maintain RLE WB precautions. During stand pivot transfer, pt's EKG registered 16 beats of SVT as well as asystole. Suspected due to artifact noise from placement of gait belt and pt's mobility. EKG leads also detatched during transfer, reapplied and HR WNL. RN agrees with PT interpretation of EKG abnormalities. Pt asymptomatic during session. PT to progress mobility and will continue to follow acutely.  BP and HR (bpm), supine: 127/66, 67 BP and HR (bpm), sitting: 135/69, 70 BP and HR (bpm), standing: 137/72, 84 BP and HR (bpm), sitting post-transfer: 133/64, 71  O2sats on room air during session: 86-98% (86% after transfer, 2L O2 via [place] reapplied immediately)    Follow Up Recommendations  SNF     Equipment Recommendations  None recommended by PT    Recommendations for Other Services       Precautions / Restrictions Precautions Precautions: Fall Restrictions Weight Bearing Restrictions: Yes RLE Weight Bearing: Touchdown weight bearing RLE Partial Weight Bearing Percentage or Pounds: Op note states TDWB, orders state 30% WB. Treated and educated pt conservatively with TDWB. Pt with difficulty maintaining WB precautions, constant verbal reinforcement.     Mobility  Bed Mobility Overal bed  mobility: Needs Assistance Bed Mobility: Supine to Sit     Supine to sit: Min assist     General bed mobility comments: BP and HR in supine 127/66 and 67 bpm. Assist for RLE management, scooting to EOB. Increased time and effort, pain limiting pt. Pt with BP and HR of 135/69 and 70 bpm in sitting.   Transfers Overall transfer level: Needs assistance Equipment used: Rolling walker (2 wheeled) Transfers: Sit to/from Stand Sit to Stand: Mod assist;From elevated surface;+2 safety/equipment Stand pivot transfers: Mod assist;From elevated surface       General transfer comment: Assist for power up, steadying, and reinforcing WB precautions. Pt stating during standing "I think I am putting too much weight on my right foot", PT assisting pt in weight shift to L to decrease weightbearing and maintain precautions. BP and HR in standing 137/72 and 84 bpm. Pt with some nausea upon standing, which passed with continued standing. Pt with assist to stand pivot to chair for steadying, reinforcing precautions, supporting trunk. HR on monitor registered as asystole after stand pivot to chair, discovered a lead was disconnected. When reattached, HR 74 bpm. RN notified that pt went into 16 beats of SVT, suspected artifact because of gait belt and pt mobility. Pt asymptomatic during session. PT removed pt O2 via [place] during session, sats remained 92-98% on room air after bed mobility and standing, dropped to 86% momentarily after stand pivot. Pt placed back of 2LO2, returned to 94% O2sat.   Ambulation/Gait                 Stairs  Wheelchair Mobility    Modified Rankin (Stroke Patients Only)       Balance Overall balance assessment: Needs assistance Sitting-balance support: No upper extremity supported;Feet supported Sitting balance-Leahy Scale: Fair     Standing balance support: Bilateral upper extremity supported;During functional activity Standing balance-Leahy Scale:  Poor Standing balance comment: Relies heavily on RW and PT support, pt with posterior leaning and RW rocking.                             Cognition Arousal/Alertness: Awake/alert Behavior During Therapy: Anxious;WFL for tasks assessed/performed Overall Cognitive Status: Within Functional Limits for tasks assessed                                        Exercises      General Comments        Pertinent Vitals/Pain Pain Assessment: Faces Faces Pain Scale: Hurts little more Pain Location: R knee and thigh, with bed mobility  Pain Descriptors / Indicators: Sore Pain Intervention(s): Limited activity within patient's tolerance;Repositioned;Monitored during session    Home Living                      Prior Function            PT Goals (current goals can now be found in the care plan section) Acute Rehab PT Goals PT Goal Formulation: With patient Time For Goal Achievement: 05/18/18 Potential to Achieve Goals: Good Progress towards PT goals: Progressing toward goals    Frequency    Min 5X/week      PT Plan Current plan remains appropriate    Co-evaluation              AM-PAC PT "6 Clicks" Daily Activity  Outcome Measure  Difficulty turning over in bed (including adjusting bedclothes, sheets and blankets)?: Unable Difficulty moving from lying on back to sitting on the side of the bed? : Unable Difficulty sitting down on and standing up from a chair with arms (e.g., wheelchair, bedside commode, etc,.)?: Unable Help needed moving to and from a bed to chair (including a wheelchair)?: A Lot Help needed walking in hospital room?: A Lot Help needed climbing 3-5 steps with a railing? : Total 6 Click Score: 8    End of Session Equipment Utilized During Treatment: Gait belt Activity Tolerance: Patient limited by pain;Patient limited by fatigue Patient left: with call bell/phone within reach;with family/visitor present;in chair;with  chair alarm set Nurse Communication: Mobility status PT Visit Diagnosis: History of falling (Z91.81);Unsteadiness on feet (R26.81)     Time: 1129-1206 PT Time Calculation (min) (ACUTE ONLY): 37 min  Charges:  $Therapeutic Activity: 23-37 mins                     Julien Girt, PT Acute Rehabilitation Services Pager [phone]  Office [phone]    Nakari Bracknell D Elonda Husky 05/08/2018, 12:42 PM

## 2018-05-08 NOTE — Discharge Summary (Addendum)
Physician Discharge Summary  [name] URK:[id number] DOB: 24-Sep-1931 DOA: 05/02/2018  PCP: Burnard Bunting, MD  Admit date: 05/02/2018 Discharge date: 05/08/2018  Admitted From:home Disposition: Skilled nursing facility Recommendations for Outpatient Follow-up:  1. Follow up with PCP in 1-2 weeks 2. Please obtain BMP/CBC in one week 3. Follow-up with cardiology 4. Follow-up with Ortho 5. Follow-up chest x-ray 05/10/2018 6. Continue oxygen and taper to dc as tolerated 7. Continue foley care and attempt dc she had urinary retention   Home Health none Equipment/Devices: None  Discharge Condition stable CODE STATUS full code Diet recommendation: Cardiac diet Brief/Interim Summary:84 y.o.femalewith medical history significant of orthostatic hypotension, hyperlipidemia and lower extremity edema due to venous insufficiency,Left Breast Cancer20 years ago   Presented witha fall she was trying to saw a pair of pants. No head injury. Resulting in rotation of right leg no LOC she is not sure how she fell she had to crawl on the floor for an hour in order to get to the phone to call EMS not on any blood thinners EMS arrived administered 100 mcg of fentanyl.  She denies shortness of breath or chest pain she goes to the Gym twice no exertional chest pain. Able to walk up a flight of stairs. Regarding pertinent Chronic problems:Orthostatic hypotension and leg edema due to venous stasis followed by cardiology wears compression stockings Total knee replacement 3 years ago  Discharge Diagnoses:  Active Problems:   Essential hypertension, benign   Orthostatic hypotension   Diastolic dysfunction without heart failure   Femur fracture, right (HCC)   Prolonged QT interval   Bigeminy   Pain   Displaced supracondylar fracture of distal end of right femur without intracondylar extension (HCC)   Postop check   Bradycardia  #1 right femur fracture status post fall status post  right retrograde intramedullary nail fixation. Continue pain control bowel softeners DVT prophylaxis with Lovenox for a total of 30 days last day  will be November  03/2018..DC to SNF  #2 history of hypertension with orthostatic hypotension and bradycardia-antihypertensives have been restarted due to soft blood pressure.  Patient should follow-up with her cardiologist Dr.Croituru patient has had sinus bradycardia during this hospital stay with PACs and PVCs.  Follow-up labs couple of times during the week to make sure her electrolytes  are normal.  Also have TED hose on in the morning and off at night to help with orthostasis.  #3hyperlipidemia continue statin  #4 constipation-continue stool softners.foley placed due to urine retention.   Discharge Instructions  Discharge Instructions    Call MD for:  difficulty breathing, headache or visual disturbances   Complete by:  As directed    Call MD for:  persistant dizziness or light-headedness   Complete by:  As directed    Call MD for:  persistant nausea and vomiting   Complete by:  As directed    Call MD for:  temperature >100.4   Complete by:  As directed    Diet - low sodium heart healthy   Complete by:  As directed    Increase activity slowly   Complete by:  As directed      Allergies as of 05/08/2018      Reactions   Oysters [shellfish Allergy] Nausea And Vomiting   Codeine Nausea And Vomiting      Medication List    STOP taking these medications   amoxicillin 500 MG capsule Commonly known as:  AMOXIL   triamterene-hydrochlorothiazide 75-50 MG tablet Commonly known as:  MAXZIDE     TAKE these medications   acetaminophen 500 MG tablet Commonly known as:  TYLENOL Take 1,000 mg by mouth daily as needed (back pain).   alendronate 70 MG tablet Commonly known as:  FOSAMAX Take 70 mg by mouth once a week. Every Sunday.   bisacodyl 5 MG EC tablet Commonly known as:  DULCOLAX Take 1 tablet (5 mg total) by mouth  daily as needed for moderate constipation.   docusate sodium 100 MG capsule Commonly known as:  COLACE Take 1 capsule (100 mg total) by mouth 2 (two) times daily.   enoxaparin 40 MG/0.4ML injection Commonly known as:  LOVENOX Inject 0.4 mLs (40 mg total) into the skin daily.   escitalopram 20 MG tablet Commonly known as:  LEXAPRO TAKE 1 TABLET BY MOUTH ONCE DAILY   HYDROcodone-acetaminophen 5-325 MG tablet Commonly known as:  NORCO/VICODIN Take 1-2 tablets by mouth every 6 (six) hours as needed for moderate pain.   menthol-cetylpyridinium 3 MG lozenge Commonly known as:  CEPACOL Take 1 lozenge (3 mg total) by mouth as needed for sore throat (sore throat).   MULTIVITAMIN ADULT Tabs MULTIVITAMINS TABS   ondansetron 4 MG tablet Commonly known as:  ZOFRAN Take 1 tablet (4 mg total) by mouth every 6 (six) hours as needed for nausea.   senna 8.6 MG Tabs tablet Commonly known as:  SENOKOT Take 1 tablet (8.6 mg total) by mouth 2 (two) times daily.   simvastatin 20 MG tablet Commonly known as:  ZOCOR Take 20 mg by mouth daily.      Follow-up Information    Swinteck, Aaron Edelman, MD. Schedule an appointment as soon as possible for a visit in 2 weeks.   Specialty:  Orthopedic Surgery Why:  For suture removal Contact information: [address] [place] [address] [phone]        Sanda Klein, MD Follow up.   Specialty:  Cardiology Contact information: [address] [place] [address] [phone]        Burnard Bunting, MD Follow up.   Specialty:  Internal Medicine Contact information: [place] [address] [phone]          Allergies  Allergen Reactions  . Oysters [Shellfish Allergy] Nausea And Vomiting  . Codeine Nausea And Vomiting    Consultations:cardiology  Procedures/Studies: Dg Chest 1 View  Result Date: 05/08/2018 CLINICAL DATA:  Hx. Bradycardia, sob EXAM: CHEST  1 VIEW  COMPARISON:  05/02/2018 FINDINGS: Increase in interstitial edema or infiltrates with a predominant infrahilar distribution. Right hilar prominence as before. Stable cardiomegaly. No definite effusion. Surgical clips in the left axilla.  Right shoulder DJD. IMPRESSION: Bilateral interstitial edema or infiltrates, slightly increased since prior study. Electronically Signed   By: Lucrezia Europe M.D.   On: 05/08/2018 10:05   Dg Chest 1 View  Result Date: 05/02/2018 CLINICAL DATA:  Status post fall with pain EXAM: CHEST  1 VIEW COMPARISON:  October 18, 2015 FINDINGS: The heart size and mediastinal contours are stable. The heart size is enlarged. Both lungs are clear. There is no pneumothorax. Chronic deformities of several lateral left ribs are identified. Surgical clips are noted in the left axilla. IMPRESSION: No acute cardiopulmonary disease identified. Electronically Signed   By: Abelardo Diesel M.D.   On: 05/02/2018 19:50   Dg Pelvis 1-2 Views  Result Date: 05/02/2018 CLINICAL DATA:  Status post fall with pelvic pain. EXAM: PELVIS - 1-2 VIEW COMPARISON:  None. FINDINGS: There is no  evidence of pelvic fracture or dislocation. Degenerative joint changes of bilateral hips with narrowed joint space and osteophyte formation are noted. IMPRESSION: No acute fracture or dislocation. Electronically Signed   By: Abelardo Diesel M.D.   On: 05/02/2018 19:50   Dg Abd 1 View  Result Date: 05/07/2018 CLINICAL DATA:  Diarrhea, abdominal pain EXAM: ABDOMEN - 1 VIEW COMPARISON:  05/05/2018 FINDINGS: Nonobstructive bowel gas pattern. Gas throughout sigmoid colon to rectum. No bowel dilatation, bowel wall thickening, or free air. Bones demineralized with prior vertebroplasties at L1, L2 and L4. IMPRESSION: Nonspecific bowel gas pattern. Electronically Signed   By: Lavonia Dana M.D.   On: 05/07/2018 12:52   Dg Abd 1 View  Result Date: 05/05/2018 CLINICAL DATA:  Pt 2 days postop from right IM nail with complaints of constipation.  Pt stated her last bowel movement was on Wednesday. EXAM: ABDOMEN - 1 VIEW COMPARISON:  None. FINDINGS: Overall bowel gas pattern is nonobstructive. Gas and stool throughout the colon, but no associated bowel distension. No evidence of soft tissue mass or abnormal fluid collection. No evidence of free intraperitoneal air. Recently placed intramedullary rod is partially imaged at the lower aspects of the study. IMPRESSION: Nonobstructive bowel gas pattern. No convincing radiographic evidence of constipation. Gas and stool throughout the colon, but no associated bowel distension. Electronically Signed   By: Franki Cabot M.D.   On: 05/05/2018 11:12   Dg C-arm 1-60 Min-no Report  Result Date: 05/03/2018 Fluoroscopy was utilized by the requesting physician.  No radiographic interpretation.   Dg Femur, Min 2 Views Right  Result Date: 05/02/2018 CLINICAL DATA:  Status post fall with right femur pain. EXAM: RIGHT FEMUR 2 VIEWS COMPARISON:  None. FINDINGS: There is comminuted displaced fracture of the distal right femoral shaft above the knee replacement. There is no dislocation. IMPRESSION: Fracture of distal right femoral shaft. Electronically Signed   By: Abelardo Diesel M.D.   On: 05/02/2018 19:51   Dg Femur Port, Min 2 Views Right  Result Date: 05/03/2018 CLINICAL DATA:  Status post retrograde right femoral nail - PACU images EXAM: RIGHT FEMUR PORTABLE 2 VIEW COMPARISON:  05/02/2018 FINDINGS: Status post ORIF of comminuted distal femur fracture with intramedullary nail. Remote knee arthroplasty. Expected postoperative gas and surgical clips are present. IMPRESSION: Status post ORIF of distal femur fracture. Electronically Signed   By: Nolon Nations M.D.   On: 05/03/2018 21:08    (Echo, Carotid, EGD, Colonoscopy, ERCP)    Subjective:   Discharge Exam: Vitals:   05/07/18 2139 05/08/18 0531  BP: (!) 102/54 (!) 116/54  Pulse: (!) 38 (!) 40  Resp: 18 18  Temp: 98.6 F (37 C) 97.9 F (36.6 C)   SpO2: 96% 94%   Vitals:   05/07/18 0526 05/07/18 1251 05/07/18 2139 05/08/18 0531  BP: 109/64 108/60 (!) 102/54 (!) 116/54  Pulse: (!) 56 76 (!) 38 (!) 40  Resp: 16 18 18 18   Temp: 98.8 F (37.1 C) 98 F (36.7 C) 98.6 F (37 C) 97.9 F (36.6 C)  TempSrc: Oral Oral Oral Oral  SpO2: 93% 95% 96% 94%  Weight:      Height:        General: Pt is alert, awake, not in acute distress Cardiovascular: RRR, S1/S2 +, no rubs, no gallops Respiratory: CTA bilaterally, no wheezing, no rhonchi Abdominal: Soft, NT, ND, bowel sounds + Extremities: no edema, no cyanosis    The results of significant diagnostics from this hospitalization (including imaging, microbiology, ancillary and  laboratory) are listed below for reference.     Microbiology: Recent Results (from the past 240 hour(s))  Surgical PCR screen     Status: Abnormal   Collection Time: 05/03/18 12:29 AM  Result Value Ref Range Status   MRSA, PCR NEGATIVE NEGATIVE Final   Staphylococcus aureus POSITIVE (A) NEGATIVE Final    Comment: (NOTE) The Xpert SA Assay (FDA approved for NASAL specimens in patients 13 years of age and older), is one component of a comprehensive surveillance program. It is not intended to diagnose infection nor to guide or monitor treatment. Performed at [facility] [place] [address], [place], [place] [address]      Labs: BNP (last 3 results) No results for input(s): BNP in the last 8760 hours. Basic Metabolic Panel: Recent Labs  Lab 05/02/18 2246 05/03/18 0600 05/04/18 0547 05/05/18 0553 05/06/18 0522 05/07/18 0540  NA  --  139 142 138 138 135  K  --  3.3* 3.9 4.3 3.8 3.8  CL  --  103 104 101 101 96*  CO2  --  26 30 29 27 29   GLUCOSE  --  91 142* 119* 106* 102*  BUN  --  22 18 17 13 16   CREATININE  --  0.64 0.71 0.57 0.54 0.64  CALCIUM  --  9.0 8.9 8.6* 8.5* 8.5*  MG 1.7  --  1.9  --   --   --   PHOS 4.0  --   --   --   --   --    Liver Function Tests: Recent Labs   Lab 05/03/18 0600  ALBUMIN 3.8   No results for input(s): LIPASE, AMYLASE in the last 168 hours. No results for input(s): AMMONIA in the last 168 hours. CBC: Recent Labs  Lab 05/02/18 1808 05/03/18 0600 05/04/18 0547 05/05/18 0553 05/06/18 0522 05/07/18 0540  WBC 8.2 7.5 7.8 8.0 7.8 6.6  NEUTROABS 6.0  --   --   --   --   --   HGB 13.4 12.9 11.8* 10.8* 10.6* 10.2*  HCT 41.5 41.8 37.7 34.5* 33.3* 31.6*  MCV 97.4 101.7* 100.0 101.8* 99.7 98.4  PLT 192 177 174 137* 153 148*   Cardiac Enzymes: Recent Labs  Lab 05/02/18 2246 05/03/18 0600 05/03/18 0942  TROPONINI <0.03 <0.03 <0.03   BNP: Invalid input(s): POCBNP CBG: No results for input(s): GLUCAP in the last 168 hours. D-Dimer No results for input(s): DDIMER in the last 72 hours. Hgb A1c No results for input(s): HGBA1C in the last 72 hours. Lipid Profile No results for input(s): CHOL, HDL, LDLCALC, TRIG, CHOLHDL, LDLDIRECT in the last 72 hours. Thyroid function studies No results for input(s): TSH, T4TOTAL, T3FREE, THYROIDAB in the last 72 hours.  Invalid input(s): FREET3 Anemia work up No results for input(s): VITAMINB12, FOLATE, FERRITIN, TIBC, IRON, RETICCTPCT in the last 72 hours. Urinalysis    Component Value Date/Time   COLORURINE YELLOW 10/19/2014 1450   APPEARANCEUR CLEAR 10/19/2014 1450   LABSPEC 1.044 (H) 10/19/2014 1450   PHURINE 7.5 10/19/2014 1450   GLUCOSEU NEGATIVE 10/19/2014 1450   HGBUR NEGATIVE 10/19/2014 1450   BILIRUBINUR NEGATIVE 10/19/2014 1450   KETONESUR NEGATIVE 10/19/2014 1450   PROTEINUR NEGATIVE 10/19/2014 1450   UROBILINOGEN 1.0 10/19/2014 1450   NITRITE NEGATIVE 10/19/2014 1450   LEUKOCYTESUR SMALL (A) 10/19/2014 1450   Sepsis Labs Invalid input(s): PROCALCITONIN,  WBC,  LACTICIDVEN Microbiology Recent Results (from the past 240 hour(s))  Surgical PCR screen  Status: Abnormal   Collection Time: 05/03/18 12:29 AM  Result Value Ref Range Status   MRSA, PCR NEGATIVE  NEGATIVE Final   Staphylococcus aureus POSITIVE (A) NEGATIVE Final    Comment: (NOTE) The Xpert SA Assay (FDA approved for NASAL specimens in patients 34 years of age and older), is one component of a comprehensive surveillance program. It is not intended to diagnose infection nor to guide or monitor treatment. Performed at [facility], [place] [address] [place], [place] [address]      Time coordinating discharge: 34 minutes  SIGNED:   Georgette Shell, MD  [facility] 05/08/2018, 10:53 AM Pager   If 7PM-7AM, please contact night-coverage [email or web address] Password [email or web address]

## 2018-05-08 NOTE — Progress Notes (Signed)
MD has placed Discharge to Rehab this afternoon. RN discussed discharge with MD. Pt will be seen by Physical Therapy prior to discharge. Foley catheter to remain in place at discharge. Pt and family updated.

## 2018-05-08 NOTE — Care Management Note (Signed)
Case Management Note  Patient Details  Name: [name] MRN: [id number] Date of Birth: [birth year]  Subjective/Objective:                    Action/Plan:dc SNF.   Expected Discharge Date:  05/08/18               Expected Discharge Plan:  Skilled Nursing Facility  In-House Referral:  Clinical Social Work  Discharge planning Services  CM Consult  Post Acute Care Choice:    Choice offered to:     DME Arranged:    DME Agency:     HH Arranged:    [place] Agency:     Status of Service:  Completed, signed off  If discussed at [employer], dates discussed:    Additional Comments:  Dessa Phi, RN 05/08/2018, 11:08 AM

## 2018-05-10 DIAGNOSIS — J810 Acute pulmonary edema: Secondary | ICD-10-CM | POA: Diagnosis not present

## 2018-05-10 DIAGNOSIS — R339 Retention of urine, unspecified: Secondary | ICD-10-CM | POA: Diagnosis not present

## 2018-05-10 DIAGNOSIS — K5900 Constipation, unspecified: Secondary | ICD-10-CM | POA: Diagnosis not present

## 2018-05-10 DIAGNOSIS — S72451S Displaced supracondylar fracture without intracondylar extension of lower end of right femur, sequela: Secondary | ICD-10-CM | POA: Diagnosis not present

## 2018-05-10 DIAGNOSIS — R0989 Other specified symptoms and signs involving the circulatory and respiratory systems: Secondary | ICD-10-CM | POA: Diagnosis not present

## 2018-05-14 DIAGNOSIS — R2689 Other abnormalities of gait and mobility: Secondary | ICD-10-CM | POA: Diagnosis not present

## 2018-05-14 DIAGNOSIS — M6281 Muscle weakness (generalized): Secondary | ICD-10-CM | POA: Diagnosis not present

## 2018-05-14 DIAGNOSIS — S72451S Displaced supracondylar fracture without intracondylar extension of lower end of right femur, sequela: Secondary | ICD-10-CM | POA: Diagnosis not present

## 2018-05-14 DIAGNOSIS — R339 Retention of urine, unspecified: Secondary | ICD-10-CM | POA: Diagnosis not present

## 2018-05-15 DIAGNOSIS — E785 Hyperlipidemia, unspecified: Secondary | ICD-10-CM | POA: Diagnosis not present

## 2018-05-15 DIAGNOSIS — I10 Essential (primary) hypertension: Secondary | ICD-10-CM | POA: Diagnosis not present

## 2018-05-15 DIAGNOSIS — S7291XD Unspecified fracture of right femur, subsequent encounter for closed fracture with routine healing: Secondary | ICD-10-CM | POA: Diagnosis not present

## 2018-05-16 DIAGNOSIS — S72451S Displaced supracondylar fracture without intracondylar extension of lower end of right femur, sequela: Secondary | ICD-10-CM | POA: Diagnosis not present

## 2018-05-16 DIAGNOSIS — R339 Retention of urine, unspecified: Secondary | ICD-10-CM | POA: Diagnosis not present

## 2018-05-16 DIAGNOSIS — M6281 Muscle weakness (generalized): Secondary | ICD-10-CM | POA: Diagnosis not present

## 2018-05-18 DIAGNOSIS — S72454D Nondisplaced supracondylar fracture without intracondylar extension of lower end of right femur, subsequent encounter for closed fracture with routine healing: Secondary | ICD-10-CM | POA: Diagnosis not present

## 2018-05-21 ENCOUNTER — Encounter: Payer: Self-pay | Admitting: Physician Assistant

## 2018-05-21 ENCOUNTER — Ambulatory Visit: Payer: Medicare Other | Admitting: Physician Assistant

## 2018-05-21 VITALS — BP 110/60 | HR 66

## 2018-05-21 DIAGNOSIS — I872 Venous insufficiency (chronic) (peripheral): Secondary | ICD-10-CM

## 2018-05-21 DIAGNOSIS — I951 Orthostatic hypotension: Secondary | ICD-10-CM

## 2018-05-21 DIAGNOSIS — E785 Hyperlipidemia, unspecified: Secondary | ICD-10-CM | POA: Diagnosis not present

## 2018-05-21 DIAGNOSIS — R072 Precordial pain: Secondary | ICD-10-CM

## 2018-05-21 DIAGNOSIS — R001 Bradycardia, unspecified: Secondary | ICD-10-CM

## 2018-05-21 MED ORDER — SIMVASTATIN 20 MG PO TABS: 20.0000 mg | ORAL_TABLET | Freq: Every day | ORAL | 1 refills | Status: DC

## 2018-05-21 NOTE — Addendum Note (Signed)
Addended by: Diana Eves on: 05/21/2018 01:20 PM   Modules accepted: Orders
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
## 2018-05-21 NOTE — Patient Instructions (Signed)
Medication Instructions:  STOP Atorvastatin  START Simvastatin 20 mg daily.  If you need a refill on your cardiac medications before your next appointment, please call your pharmacy.   Lab work: none  Testing/Procedures: Your physician has requested that you have a lexiscan myoview. For further information please visit [email or web address]. Please follow instruction sheet, as given.  --No caffeinated drinks 12 hours prior to stress test.--  Follow-Up: At [facility], you and your health needs are our priority.  As part of our continuing mission to provide you with exceptional heart care, we have created designated Provider Care Teams.  These Care Teams include your primary Cardiologist (physician) and Advanced Practice Providers (APPs -  Physician Assistants and Nurse Practitioners) who all work together to provide you with the care you need, when you need it. You will need a follow up appointment in 6 months.  Please call our office 2 months in advance to schedule this appointment.  You may see Sanda Klein, MD or one of the following Advanced Practice Providers on your designated Care Team: [place], [place] . Fabian Sharp, PA-C  Any Other Special Instructions Will Be Listed Below (If Applicable). none

## 2018-05-22 DIAGNOSIS — M6281 Muscle weakness (generalized): Secondary | ICD-10-CM | POA: Diagnosis not present

## 2018-05-22 DIAGNOSIS — R339 Retention of urine, unspecified: Secondary | ICD-10-CM | POA: Diagnosis not present

## 2018-05-22 DIAGNOSIS — S72451S Displaced supracondylar fracture without intracondylar extension of lower end of right femur, sequela: Secondary | ICD-10-CM | POA: Diagnosis not present

## 2018-05-23 DIAGNOSIS — R339 Retention of urine, unspecified: Secondary | ICD-10-CM | POA: Diagnosis not present

## 2018-05-23 DIAGNOSIS — S72451S Displaced supracondylar fracture without intracondylar extension of lower end of right femur, sequela: Secondary | ICD-10-CM | POA: Diagnosis not present

## 2018-05-23 DIAGNOSIS — M6281 Muscle weakness (generalized): Secondary | ICD-10-CM | POA: Diagnosis not present

## 2018-05-24 ENCOUNTER — Ambulatory Visit: Payer: Medicare Other

## 2018-05-25 DIAGNOSIS — S72451S Displaced supracondylar fracture without intracondylar extension of lower end of right femur, sequela: Secondary | ICD-10-CM | POA: Diagnosis not present

## 2018-05-25 DIAGNOSIS — R262 Difficulty in walking, not elsewhere classified: Secondary | ICD-10-CM | POA: Diagnosis not present

## 2018-05-25 DIAGNOSIS — I872 Venous insufficiency (chronic) (peripheral): Secondary | ICD-10-CM | POA: Diagnosis not present

## 2018-05-25 DIAGNOSIS — M6281 Muscle weakness (generalized): Secondary | ICD-10-CM | POA: Diagnosis not present

## 2018-05-28 ENCOUNTER — Other Ambulatory Visit: Payer: Self-pay | Admitting: Cardiovascular Disease

## 2018-05-29 ENCOUNTER — Encounter: Payer: Self-pay | Admitting: Physician Assistant

## 2018-05-29 DIAGNOSIS — R262 Difficulty in walking, not elsewhere classified: Secondary | ICD-10-CM | POA: Diagnosis not present

## 2018-05-29 DIAGNOSIS — M6281 Muscle weakness (generalized): Secondary | ICD-10-CM | POA: Diagnosis not present

## 2018-05-29 DIAGNOSIS — R339 Retention of urine, unspecified: Secondary | ICD-10-CM | POA: Diagnosis not present

## 2018-05-29 DIAGNOSIS — S72451S Displaced supracondylar fracture without intracondylar extension of lower end of right femur, sequela: Secondary | ICD-10-CM | POA: Diagnosis not present

## 2018-06-01 ENCOUNTER — Inpatient Hospital Stay (HOSPITAL_COMMUNITY): Admission: RE | Admit: 2018-06-01 | Payer: Medicare Other | Source: Ambulatory Visit

## 2018-06-12 DIAGNOSIS — S72454D Nondisplaced supracondylar fracture without intracondylar extension of lower end of right femur, subsequent encounter for closed fracture with routine healing: Secondary | ICD-10-CM | POA: Diagnosis not present

## 2018-06-19 DIAGNOSIS — K649 Unspecified hemorrhoids: Secondary | ICD-10-CM | POA: Diagnosis not present

## 2018-06-19 DIAGNOSIS — K5900 Constipation, unspecified: Secondary | ICD-10-CM | POA: Diagnosis not present

## 2018-06-19 DIAGNOSIS — S72451S Displaced supracondylar fracture without intracondylar extension of lower end of right femur, sequela: Secondary | ICD-10-CM | POA: Diagnosis not present

## 2018-06-20 DIAGNOSIS — I10 Essential (primary) hypertension: Secondary | ICD-10-CM | POA: Diagnosis not present

## 2018-06-20 DIAGNOSIS — R262 Difficulty in walking, not elsewhere classified: Secondary | ICD-10-CM | POA: Diagnosis not present

## 2018-06-20 DIAGNOSIS — M6281 Muscle weakness (generalized): Secondary | ICD-10-CM | POA: Diagnosis not present

## 2018-06-20 DIAGNOSIS — I872 Venous insufficiency (chronic) (peripheral): Secondary | ICD-10-CM | POA: Diagnosis not present

## 2018-06-28 ENCOUNTER — Ambulatory Visit: Payer: Medicare Other | Admitting: Cardiovascular Disease

## 2018-06-29 DIAGNOSIS — S72451D Displaced supracondylar fracture without intracondylar extension of lower end of right femur, subsequent encounter for closed fracture with routine healing: Secondary | ICD-10-CM | POA: Diagnosis not present

## 2018-06-29 DIAGNOSIS — I119 Hypertensive heart disease without heart failure: Secondary | ICD-10-CM | POA: Diagnosis not present

## 2018-06-29 DIAGNOSIS — I951 Orthostatic hypotension: Secondary | ICD-10-CM | POA: Diagnosis not present

## 2018-06-29 DIAGNOSIS — I872 Venous insufficiency (chronic) (peripheral): Secondary | ICD-10-CM | POA: Diagnosis not present

## 2018-06-29 DIAGNOSIS — R001 Bradycardia, unspecified: Secondary | ICD-10-CM | POA: Diagnosis not present

## 2018-07-04 DIAGNOSIS — Z967 Presence of other bone and tendon implants: Secondary | ICD-10-CM | POA: Diagnosis not present

## 2018-07-04 DIAGNOSIS — M79604 Pain in right leg: Secondary | ICD-10-CM | POA: Diagnosis not present

## 2018-07-04 DIAGNOSIS — R2689 Other abnormalities of gait and mobility: Secondary | ICD-10-CM | POA: Diagnosis not present

## 2018-07-04 DIAGNOSIS — I519 Heart disease, unspecified: Secondary | ICD-10-CM | POA: Diagnosis not present

## 2018-07-05 ENCOUNTER — Other Ambulatory Visit: Payer: Self-pay

## 2018-07-05 ENCOUNTER — Telehealth: Payer: Self-pay | Admitting: Cardiovascular Disease

## 2018-07-05 DIAGNOSIS — I519 Heart disease, unspecified: Secondary | ICD-10-CM

## 2018-07-05 DIAGNOSIS — I5189 Other ill-defined heart diseases: Secondary | ICD-10-CM

## 2018-07-05 DIAGNOSIS — I10 Essential (primary) hypertension: Secondary | ICD-10-CM

## 2018-07-05 DIAGNOSIS — I471 Supraventricular tachycardia: Secondary | ICD-10-CM

## 2018-07-05 MED ORDER — POTASSIUM CHLORIDE ER 10 MEQ PO TBCR: 10.0000 meq | EXTENDED_RELEASE_TABLET | Freq: Every day | ORAL | 3 refills | Status: DC

## 2018-07-05 NOTE — Telephone Encounter (Signed)
Please fill prescription for potassium chloride 10 mEq daily in addition to the triamterene/hydrochlorothiazide. #90, RF 3. Please schedule BMET in 30 days.

## 2018-07-05 NOTE — Telephone Encounter (Signed)
Returned call to patient.She stated she takes Triamterene/hctz 75/50 mg 1/2 tablet daily.Stated pharmacist would not fill Kdur 10 meq until she ask Dr.Croitoru if ok to take.Message sent to Dr.Croitoru for advice.

## 2018-07-05 NOTE — Telephone Encounter (Signed)
New Message   Pt c/o medication issue:  1. Name of Medication: triamterene-hydrochlorothiazide (MAXZIDE) 75-50 MG tablet and  potassium chloride (K-DUR) 10 MEQ tablet  2. How are you currently taking this medication (dosage and times per day)? TAKE 1 TABLET BY MOUTH DAILY. KEEP OV. And Take 1 tablet (10 mEq total) by mouth daily.  3. Are you having a reaction (difficulty breathing--STAT)? no  4. What is your medication issue? Pt states she went to get her Potassium filled but the pharmacist told her that the Triamterene holds potassium in the body so if she takes both it will cause irregular heart beat. Please call

## 2018-07-05 NOTE — Telephone Encounter (Signed)
Returned call to patient left message on personal voice mail Dr.Croitoru's recommendation.Advised to have bmet in 30 days.Lab order mailed.

## 2018-07-24 DIAGNOSIS — S72454D Nondisplaced supracondylar fracture without intracondylar extension of lower end of right femur, subsequent encounter for closed fracture with routine healing: Secondary | ICD-10-CM | POA: Diagnosis not present

## 2018-08-06 DIAGNOSIS — I471 Supraventricular tachycardia: Secondary | ICD-10-CM | POA: Diagnosis not present

## 2018-08-06 DIAGNOSIS — I519 Heart disease, unspecified: Secondary | ICD-10-CM | POA: Diagnosis not present

## 2018-08-06 DIAGNOSIS — I10 Essential (primary) hypertension: Secondary | ICD-10-CM | POA: Diagnosis not present

## 2018-08-06 LAB — BASIC METABOLIC PANEL
BUN / CREAT RATIO: 41 — AB (ref 12–28)
BUN: 32 mg/dL — ABNORMAL HIGH (ref 8–27)
CO2: 25 mmol/L (ref 20–29)
CREATININE: 0.78 mg/dL (ref 0.57–1.00)
Calcium: 10.3 mg/dL (ref 8.7–10.3)
Chloride: 100 mmol/L (ref 96–106)
GFR calc Af Amer: 81 mL/min/{1.73_m2} (ref >59)
GFR, EST NON AFRICAN AMERICAN: 70 mL/min/{1.73_m2} (ref >59)
Glucose: 78 mg/dL (ref 65–99)
POTASSIUM: 4.5 mmol/L (ref 3.5–5.2)
SODIUM: 140 mmol/L (ref 134–144)

## 2018-09-19 NOTE — Progress Notes (Signed)
Cardiology Office Note    Date:  09/26/2018   ID:  [name], DOB 09/07/1931, MRN [id number]  PCP:  Burnard Bunting, MD  Cardiologist:   Sanda Klein, MD   No chief complaint on file.   History of Present Illness:  [name] is a 85 y.o. female with paroxysmal atrial tachycardia orthostatic hypotension, hyperlipidemia and lower extremity edema due to venous insufficiency, returning for follow-up.   Last October she had a mechanical fall complicated by a right supracondylar femoral fracture, treated surgically.  She had some issues with sinus bradycardia and borderline hypotension during that hospitalization.  PACs and PVCs were seen.  Since her hospitalization she has not had any new falls and is slowly regaining her stamina and stability.  She is still using a cane.  She remains mildly anemic with a hemoglobin around 10.  Electrolytes and renal function are normal.  She has a remote history of DVT and has prominent bilateral varicose veins.  She had bilateral saphenectomy in the past.  The patient specifically denies any chest pain at rest exertion, dyspnea at rest or with exertion, orthopnea, paroxysmal nocturnal dyspnea, syncope, palpitations, focal neurological deficits, intermittent claudication, lower extremity edema, unexplained weight gain, cough, hemoptysis or wheezing.  At her May 13, 2018 follow-up episode she did complain of some right sided chest discomfort and a Lexiscan Myoview was ordered.  The symptoms did not sound distinctly anginal.  They did not recur and she canceled the nuclear stress test.  On the event monitor performed in August 2019, she did not have any evidence of serious arrhythmia.  No major bradycardic events and a single episode of nonsustained VT that occurred when she almost had a motor vehicle accident; with a a maximum heart rate achieved was 106 bpm which is 78% of the maximum predicted for her age  In March 2016, her echocardiogram  showed normal findings with the exception of moderate left atrial enlargement and mild diastolic left ventricular dysfunction.  October 2019 echo was essentially unchanged, EF 15-40%, systolic PA pressure mildly increased 38 mmHg.  Past Medical History:  Diagnosis Date  . Arthritis    "in my back"  . Breast cancer ([place]) 1996   left breast cancer   . Complication of anesthesia 04/18/2012   "didn't tolerate it today very well; had the shakes and very hard time w/it"  . Depression 11/20/2012  . DVT (deep venous thrombosis) ([place])   . High cholesterol   . History of alcoholism ([place])    7 1/2 years clean  . Hypertension   . Osteoporosis   . Peripheral vascular disease (HCC)    hx of ligation   . Personal history of radiation therapy 1993   Left Breast Cancer  . Spinal stenosis     Past Surgical History:  Procedure Laterality Date  . BREAST LUMPECTOMY Left 1996  . I&D EXTREMITY  04/17/2012   Procedure: IRRIGATION AND DEBRIDEMENT EXTREMITY;  Surgeon: Rudean Haskell, MD;  Location: [facility];  Service: Orthopedics;  Laterality: Right;  . KNEE ARTHROSCOPY  04/17/2012   Procedure: ARTHROSCOPY KNEE;  Surgeon: Rudean Haskell, MD;  Location: [facility];  Service: Orthopedics;  Laterality: Right;  . KYPHOPLASTY  2009, 2013   thorasic, lumbar  . MENISECTOMY Right 04/11/2012  . ORIF PERIPROSTHETIC FRACTURE Right 05/03/2018   Procedure: RIGHT RETROGRADE FEMORAL NAIL;  Surgeon: Rod Can, MD;  Location: [facility];  Service: Orthopedics;  Laterality: Right;  . POSTERIOR LAMINECTOMY / DECOMPRESSION LUMBAR  SPINE  1980's  . rotator cuff surgery  Right   . TONSILLECTOMY AND ADENOIDECTOMY     "I was a child"  . TOTAL KNEE ARTHROPLASTY Right 10/29/2012   Procedure: RIGHT TOTAL KNEE ARTHROPLASTY;  Surgeon: Gearlean Alf, MD;  Location[facility];  Service: Orthopedics;  Laterality: Right;  . VEIN LIGATION      Current Medications: Outpatient Medications Prior to Visit  Medication Sig Dispense Refill  .  acetaminophen (TYLENOL) 500 MG tablet Take 1,000 mg by mouth daily as needed (back pain).    Marland Kitchen alendronate (FOSAMAX) 70 MG tablet Take 70 mg by mouth once a week. Every Sunday.    . Calcium Carbonate-Vitamin D (CALCIUM-D) 600-400 MG-UNIT TABS Take 1 tablet by mouth daily.    Marland Kitchen docusate sodium (COLACE) 100 MG capsule Take 1 capsule (100 mg total) by mouth 2 (two) times daily. 10 capsule 0  . escitalopram (LEXAPRO) 10 MG tablet Take 10 mg by mouth daily.    . Multiple Vitamins-Minerals (MULTIVITAMIN ADULT) TABS Take 1 tablet by mouth daily.     . potassium chloride (K-DUR) 10 MEQ tablet Take 1 tablet (10 mEq total) by mouth daily. 90 tablet 3  . simvastatin (ZOCOR) 20 MG tablet Take 1 tablet (20 mg total) by mouth daily. 90 tablet 1  . tamsulosin (FLOMAX) 0.4 MG CAPS capsule Take 0.4 mg by mouth daily.    Marland Kitchen triamterene-hydrochlorothiazide (MAXZIDE) 75-50 MG tablet Take 1/2 tablet daily 90 tablet 3  . bisacodyl (DULCOLAX) 5 MG EC tablet Take 1 tablet (5 mg total) by mouth daily as needed for moderate constipation. 30 tablet 0  . enoxaparin (LOVENOX) 40 MG/0.4ML injection Inject 0.4 mLs (40 mg total) into the skin daily. 30 Syringe 0  . HYDROcodone-acetaminophen (NORCO/VICODIN) 5-325 MG tablet Take 1-2 tablets by mouth every 6 (six) hours as needed for moderate pain. 30 tablet 0  . menthol-cetylpyridinium (CEPACOL) 3 MG lozenge Take 1 lozenge (3 mg total) by mouth as needed for sore throat (sore throat). 100 tablet 12  . ondansetron (ZOFRAN) 4 MG tablet Take 1 tablet (4 mg total) by mouth every 6 (six) hours as needed for nausea. 20 tablet 0  . senna (SENOKOT) 8.6 MG TABS tablet Take 1 tablet (8.6 mg total) by mouth 2 (two) times daily. 120 each 0   No facility-administered medications prior to visit.      Allergies:   Oysters [shellfish allergy] and Codeine   Social History   Socioeconomic History  . Marital status: Married    Spouse name: Not on file  . Number of children: 2  . Years of  education: Not on file  . Highest education level: Not on file  Occupational History  . Occupation: retired  Scientific laboratory technician  . Financial resource strain: Not on file  . Food insecurity:    Worry: Not on file    Inability: Not on file  . Transportation needs:    Medical: Not on file    Non-medical: Not on file  Tobacco Use  . Smoking status: Never Smoker  . Smokeless tobacco: Never Used  Substance and Sexual Activity  . Alcohol use: No  . Drug use: No  . Sexual activity: Not on file  Lifestyle  . Physical activity:    Days per week: Not on file    Minutes per session: Not on file  . Stress: Not on file  Relationships  . Social connections:    Talks on phone: Not on file  Gets together: Not on file    Attends religious service: Not on file    Active member of club or organization: Not on file    Attends meetings of clubs or organizations: Not on file    Relationship status: Not on file  Other Topics Concern  . Not on file  Social History Narrative  . Not on file     Family History:  The patient's family history includes Anuerysm in her father; Kidney failure in her mother.   ROS:   Please see the history of present illness.    ROS all other systems are reviewed and are negative   PHYSICAL EXAM:   VS:  BP 136/78   Pulse 67   Ht 5' 6.5" (1.689 m)   Wt 150 lb 12.8 oz (68.4 kg)   SpO2 90%   BMI 23.98 kg/m      General: Alert, oriented x3, no distress Head: no evidence of trauma, PERRL, EOMI, no exophtalmos or lid lag, no myxedema, no xanthelasma; normal ears, nose and oropharynx Neck: normal jugular venous pulsations and no hepatojugular reflux; brisk carotid pulses without delay and no carotid bruits Chest: clear to auscultation, no signs of consolidation by percussion or palpation, normal fremitus, symmetrical and full respiratory excursions Cardiovascular: normal position and quality of the apical impulse, regular rhythm, normal first and widely split second  heart sounds, no murmurs, rubs or gallops Abdomen: no tenderness or distention, no masses by palpation, no abnormal pulsatility or arterial bruits, normal bowel sounds, no hepatosplenomegaly Extremities: no clubbing, cyanosis or edema; 2+ radial, ulnar and brachial pulses bilaterally; 2+ right femoral, posterior tibial and dorsalis pedis pulses; 2+ left femoral, posterior tibial and dorsalis pedis pulses; no subclavian or femoral bruits Neurological: grossly nonfocal Psych: Normal mood and affect  Wt Readings from Last 3 Encounters:  09/25/18 150 lb 12.8 oz (68.4 kg)  05/03/18 163 lb 2.3 oz (74 kg)  04/06/18 161 lb 12.8 oz (73.4 kg)    Studies/Labs Reviewed:   EKG:  EKG is not ordered today.  LABS: BMET    Component Value Date/Time   NA 140 08/06/2018 0815   K 4.5 08/06/2018 0815   CL 100 08/06/2018 0815   CO2 25 08/06/2018 0815   GLUCOSE 78 08/06/2018 0815   GLUCOSE 102 (H) 05/07/2018 0540   BUN 32 (H) 08/06/2018 0815   CREATININE 0.78 08/06/2018 0815   CREATININE 0.75 06/13/2012 1057   CALCIUM 10.3 08/06/2018 0815   GFRNONAA 70 08/06/2018 0815   GFRNONAA 88 05/23/2012 1337   GFRAA 81 08/06/2018 0815   GFRAA >89 05/23/2012 1337   Lipid Panel  No results found for: CHOL, TRIG, HDL, CHOLHDL, VLDL, LDLCALC, LDLDIRECT March 27, 2018 Cholesterol 182, HDL 68, LDL 94, triglycerides 98 Hemoglobin A1c 5.7%  ASSESSMENT:    1. Orthostatic hypotension   2. Essential hypertension, benign   3. NSVT (nonsustained ventricular tachycardia) ([place])   4. Left ventricular diastolic dysfunction with preserved systolic function   5. Peripheral venous insufficiency   6. Hypercholesterolemia     PLAN:  In order of problems listed above:  .   1. Orthostatic hypotension: Avoid diuretics. 2. HTN: current BP range is optimal for her to allow for orthostatic drop in BP. 3. PVC/NSVT: Asymptomatic.  She had bradycardia during her recent hospital stay and we should avoid negative  chronotropic agents.   4. LV diastolic dysfunction: Clinically euvolemic. Identified by echo, but clinically asymptomatic.  She did have some signs of volume overload after  her femoral fracture and surgery, possibly related to intravenous fluid administration.  Currently asymptomatic. 5. Peripheral venous insufficiency: Should be addressed primarily with compression stockings and leg elevation.  Avoid diuretics for edema. 6. HLP: Satisfactory lipid parameters on statin therapy.  Medication Adjustments/Labs and Tests Ordered: Current medicines are reviewed at length with the patient today.  Concerns regarding medicines are outlined above.  Medication changes, Labs and Tests ordered today are listed in the Patient Instructions below. Patient Instructions  Medication Instructions:  Your physician recommends that you continue on your current medications as directed. Please refer to the Current Medication list given to you today.  If you need a refill on your cardiac medications before your next appointment, please call your pharmacy.    Follow-Up: At [facility], you and your health needs are our priority.  As part of our continuing mission to provide you with exceptional heart care, we have created designated Provider Care Teams.  These Care Teams include your primary Cardiologist (physician) and Advanced Practice Providers (APPs -  Physician Assistants and Nurse Practitioners) who all work together to provide you with the care you need, when you need it. You will need a follow up appointment in 1 years.  Please call our office 2 months in advance to schedule this appointment.  You may see Sanda Klein, MD or one of the following Advanced Practice Providers on your designated Care Team: [place], [place] . Fabian Sharp, PA-C  Any Other Special Instructions Will Be Listed Below (If Applicable). None      Signed, Sanda Klein, MD  09/26/2018 7:51 AM    [facility]  [facility] [address], [place], [place]  [address] Phone: [phone]; Fax: [phone]

## 2018-09-25 ENCOUNTER — Ambulatory Visit: Payer: Medicare Other | Admitting: Cardiovascular Disease

## 2018-09-25 ENCOUNTER — Encounter: Payer: Self-pay | Admitting: Cardiovascular Disease

## 2018-09-25 VITALS — BP 136/78 | HR 67 | Ht 66.5 in | Wt 150.8 lb

## 2018-09-25 DIAGNOSIS — I10 Essential (primary) hypertension: Secondary | ICD-10-CM | POA: Diagnosis not present

## 2018-09-25 DIAGNOSIS — I5189 Other ill-defined heart diseases: Secondary | ICD-10-CM

## 2018-09-25 DIAGNOSIS — I519 Heart disease, unspecified: Secondary | ICD-10-CM | POA: Diagnosis not present

## 2018-09-25 DIAGNOSIS — I4729 Other ventricular tachycardia: Secondary | ICD-10-CM

## 2018-09-25 DIAGNOSIS — I872 Venous insufficiency (chronic) (peripheral): Secondary | ICD-10-CM

## 2018-09-25 DIAGNOSIS — E7800 Pure hypercholesterolemia, unspecified: Secondary | ICD-10-CM

## 2018-09-25 DIAGNOSIS — I951 Orthostatic hypotension: Secondary | ICD-10-CM

## 2018-09-25 DIAGNOSIS — I472 Ventricular tachycardia: Secondary | ICD-10-CM

## 2018-09-25 NOTE — Patient Instructions (Signed)
Medication Instructions:  Your physician recommends that you continue on your current medications as directed. Please refer to the Current Medication list given to you today.  If you need a refill on your cardiac medications before your next appointment, please call your pharmacy.    Follow-Up: At [facility], you and your health needs are our priority.  As part of our continuing mission to provide you with exceptional heart care, we have created designated Provider Care Teams.  These Care Teams include your primary Cardiologist (physician) and Advanced Practice Providers (APPs -  Physician Assistants and Nurse Practitioners) who all work together to provide you with the care you need, when you need it. You will need a follow up appointment in 1 years.  Please call our office 2 months in advance to schedule this appointment.  You may see Sanda Klein, MD or one of the following Advanced Practice Providers on your designated Care Team: [place], [place] . Fabian Sharp, PA-C  Any Other Special Instructions Will Be Listed Below (If Applicable). None

## 2018-09-26 DIAGNOSIS — E1169 Type 2 diabetes mellitus with other specified complication: Secondary | ICD-10-CM | POA: Diagnosis not present

## 2018-09-26 DIAGNOSIS — I472 Ventricular tachycardia: Secondary | ICD-10-CM | POA: Insufficient documentation

## 2018-09-26 DIAGNOSIS — M40209 Unspecified kyphosis, site unspecified: Secondary | ICD-10-CM | POA: Diagnosis not present

## 2018-09-26 DIAGNOSIS — I4729 Other ventricular tachycardia: Secondary | ICD-10-CM | POA: Insufficient documentation

## 2018-09-26 DIAGNOSIS — I10 Essential (primary) hypertension: Secondary | ICD-10-CM | POA: Diagnosis not present

## 2018-09-26 DIAGNOSIS — E785 Hyperlipidemia, unspecified: Secondary | ICD-10-CM | POA: Diagnosis not present

## 2018-10-23 DIAGNOSIS — S72454D Nondisplaced supracondylar fracture without intracondylar extension of lower end of right femur, subsequent encounter for closed fracture with routine healing: Secondary | ICD-10-CM | POA: Diagnosis not present

## 2018-10-24 ENCOUNTER — Telehealth (HOSPITAL_COMMUNITY): Payer: Self-pay | Admitting: Radiology

## 2018-10-24 DIAGNOSIS — I10 Essential (primary) hypertension: Secondary | ICD-10-CM | POA: Diagnosis not present

## 2018-10-24 DIAGNOSIS — M545 Low back pain: Secondary | ICD-10-CM | POA: Diagnosis not present

## 2018-10-24 DIAGNOSIS — M48061 Spinal stenosis, lumbar region without neurogenic claudication: Secondary | ICD-10-CM | POA: Diagnosis not present

## 2018-10-24 NOTE — Telephone Encounter (Signed)
Pt called and says that she has new low back pain that feels similar to the pain she had with her past compression fracture. I encouraged the pt to call her PCP and see if he felt as if an MRI L-Spine wo was warranted and if so to have him fax me an order and I would help her get it scheduled. She states she is in terrible pain and does not think she can keep going through this pain. She will call me after she speaks to Dr. Reynaldo Minium. JM

## 2018-11-06 ENCOUNTER — Other Ambulatory Visit: Payer: Self-pay | Admitting: Internal Medicine

## 2018-11-06 DIAGNOSIS — M549 Dorsalgia, unspecified: Secondary | ICD-10-CM

## 2018-11-06 DIAGNOSIS — M48061 Spinal stenosis, lumbar region without neurogenic claudication: Secondary | ICD-10-CM

## 2018-11-13 ENCOUNTER — Ambulatory Visit
Admission: RE | Admit: 2018-11-13 | Discharge: 2018-11-13 | Disposition: A | Discharge status: Home / Self Care | Payer: Medicare Other | Source: Ambulatory Visit | Attending: Internal Medicine | Admitting: Internal Medicine

## 2018-11-13 ENCOUNTER — Other Ambulatory Visit: Payer: Self-pay

## 2018-11-13 DIAGNOSIS — M48061 Spinal stenosis, lumbar region without neurogenic claudication: Secondary | ICD-10-CM | POA: Diagnosis not present

## 2018-11-13 DIAGNOSIS — M549 Dorsalgia, unspecified: Secondary | ICD-10-CM

## 2018-11-14 ENCOUNTER — Telehealth (HOSPITAL_COMMUNITY): Payer: Self-pay | Admitting: Radiology

## 2018-11-14 NOTE — Telephone Encounter (Signed)
Pt had MRI yesterday. Called her to see what her back pain level is now. She states that at the worst it is a 6 however she can control it with Tylenol every 6 hours. She states she is not having the "spasms" that she was having but does continue to have low back pain. She feels that at this point, with the [place] situation, she will hold off on any treatment. She will call me back if the spasms start back up or the pain becomes worse again. JM

## 2018-11-14 NOTE — Telephone Encounter (Signed)
Called patient back and told her per Dr. Estanislado Pandy that she should not driver, bend, lift anything over 10 lbs., or stoop for the next couple of weeks to continue to encourage safety and healing in her back. The patient agrees to this plan of care. JM

## 2018-12-13 ENCOUNTER — Other Ambulatory Visit: Payer: Self-pay

## 2018-12-13 DIAGNOSIS — C44329 Squamous cell carcinoma of skin of other parts of face: Secondary | ICD-10-CM | POA: Diagnosis not present

## 2019-01-07 ENCOUNTER — Other Ambulatory Visit: Payer: Self-pay | Admitting: Cardiovascular Disease

## 2019-01-15 DIAGNOSIS — C44329 Squamous cell carcinoma of skin of other parts of face: Secondary | ICD-10-CM | POA: Diagnosis not present

## 2019-02-13 DIAGNOSIS — M8580 Other specified disorders of bone density and structure, unspecified site: Secondary | ICD-10-CM | POA: Diagnosis not present

## 2019-02-13 DIAGNOSIS — I10 Essential (primary) hypertension: Secondary | ICD-10-CM | POA: Diagnosis not present

## 2019-02-13 DIAGNOSIS — E785 Hyperlipidemia, unspecified: Secondary | ICD-10-CM | POA: Diagnosis not present

## 2019-02-13 DIAGNOSIS — Z1331 Encounter for screening for depression: Secondary | ICD-10-CM | POA: Diagnosis not present

## 2019-02-13 DIAGNOSIS — E1169 Type 2 diabetes mellitus with other specified complication: Secondary | ICD-10-CM | POA: Diagnosis not present

## 2019-02-16 ENCOUNTER — Encounter (HOSPITAL_BASED_OUTPATIENT_CLINIC_OR_DEPARTMENT_OTHER): Payer: Self-pay | Admitting: Emergency Medicine

## 2019-02-16 ENCOUNTER — Other Ambulatory Visit: Payer: Self-pay

## 2019-02-16 ENCOUNTER — Emergency Department (HOSPITAL_BASED_OUTPATIENT_CLINIC_OR_DEPARTMENT_OTHER)
Admission: EM | Admit: 2019-02-16 | Discharge: 2019-02-16 | Disposition: A | Discharge status: Home / Self Care | Payer: Medicare Other | Attending: Emergency Medicine | Admitting: Emergency Medicine

## 2019-02-16 ENCOUNTER — Emergency Department (HOSPITAL_BASED_OUTPATIENT_CLINIC_OR_DEPARTMENT_OTHER): Payer: Medicare Other

## 2019-02-16 DIAGNOSIS — S99921A Unspecified injury of right foot, initial encounter: Secondary | ICD-10-CM | POA: Diagnosis not present

## 2019-02-16 DIAGNOSIS — Y939 Activity, unspecified: Secondary | ICD-10-CM | POA: Insufficient documentation

## 2019-02-16 DIAGNOSIS — Y999 Unspecified external cause status: Secondary | ICD-10-CM | POA: Insufficient documentation

## 2019-02-16 DIAGNOSIS — I10 Essential (primary) hypertension: Secondary | ICD-10-CM | POA: Insufficient documentation

## 2019-02-16 DIAGNOSIS — S93401A Sprain of unspecified ligament of right ankle, initial encounter: Secondary | ICD-10-CM | POA: Diagnosis not present

## 2019-02-16 DIAGNOSIS — Z79899 Other long term (current) drug therapy: Secondary | ICD-10-CM | POA: Insufficient documentation

## 2019-02-16 DIAGNOSIS — Z853 Personal history of malignant neoplasm of breast: Secondary | ICD-10-CM | POA: Insufficient documentation

## 2019-02-16 DIAGNOSIS — S93491A Sprain of other ligament of right ankle, initial encounter: Secondary | ICD-10-CM | POA: Insufficient documentation

## 2019-02-16 DIAGNOSIS — Y929 Unspecified place or not applicable: Secondary | ICD-10-CM | POA: Diagnosis not present

## 2019-02-16 DIAGNOSIS — M25571 Pain in right ankle and joints of right foot: Secondary | ICD-10-CM | POA: Diagnosis not present

## 2019-02-16 DIAGNOSIS — Z96651 Presence of right artificial knee joint: Secondary | ICD-10-CM | POA: Diagnosis not present

## 2019-02-16 DIAGNOSIS — X501XXA Overexertion from prolonged static or awkward postures, initial encounter: Secondary | ICD-10-CM | POA: Insufficient documentation

## 2019-02-16 DIAGNOSIS — S99911A Unspecified injury of right ankle, initial encounter: Secondary | ICD-10-CM | POA: Diagnosis present

## 2019-02-16 MED ORDER — ACETAMINOPHEN 325 MG PO TABS
650.0000 mg | ORAL_TABLET | Freq: Once | ORAL | Status: AC
  Administered 2019-02-16: 650 mg via ORAL
  Filled 2019-02-16: qty 2

## 2019-02-16 NOTE — ED Notes (Signed)
ED Provider at bedside. 

## 2019-02-16 NOTE — Discharge Instructions (Addendum)
Wear the brace for support, take over-the-counter medications as needed for pain or discomfort, follow-up with your orthopedic doctor as planned

## 2019-02-16 NOTE — ED Triage Notes (Signed)
R Foot and ankle pain since twisting it last week.

## 2019-02-16 NOTE — ED Provider Notes (Signed)
[facility] EMERGENCY DEPARTMENT Provider Note   CSN: [id number] Arrival date & time: 02/16/19  4268     History   Chief Complaint Chief Complaint  Patient presents with  . Foot Pain    HPI ZAYONNA AYUSO is a 85 y.o. female.     HPI Patient presents to the emergency room for evaluation of an ankle sprain.  Patient states she had a twisting injury last week.  She has continued to notice some swelling in her foot and ankle.  She has been able to walk and bear weight.  Patient had a video visit with her primary care doctor this past week.  She mentioned her injury.  Patient was told that if it was getting worse that she probably should get an x-ray.  Patient decided to come in today for evaluation. Past Medical History:  Diagnosis Date  . Arthritis    "in my back"  . Breast cancer ([place]) 1996   left breast cancer   . Complication of anesthesia 04/18/2012   "didn't tolerate it today very well; had the shakes and very hard time w/it"  . Depression 11/20/2012  . DVT (deep venous thrombosis) ([place])   . High cholesterol   . History of alcoholism ([place])    7 1/2 years clean  . Hypertension   . Osteoporosis   . Peripheral vascular disease (HCC)    hx of ligation   . Personal history of radiation therapy 1993   Left Breast Cancer  . Spinal stenosis     Patient Active Problem List   Diagnosis Date Noted  . NSVT (nonsustained ventricular tachycardia) ([place]) 09/26/2018  . Bradycardia   . Displaced supracondylar fracture of distal end of right femur without intracondylar extension ([place]) 05/04/2018  . Postop check   . Pain   . Femur fracture, right ([place]) 05/02/2018  . Prolonged QT interval 05/02/2018  . [place] 05/02/2018  . Left ventricular diastolic dysfunction with preserved systolic function 34/19/6222  . PAT (paroxysmal atrial tachycardia) ([place]) 03/24/2016  . Diastolic dysfunction without heart failure 03/24/2016  . Peripheral venous insufficiency 03/24/2016  .  Closed wedge compression fracture of fourth lumbar vertebra ([place])   . Near syncope 10/20/2014  . Orthostatic hypotension 10/20/2014  . Fatigue 10/19/2014  . Syncope 10/19/2014  . Generalized weakness   . Elevated troponin   . Dyspnea on exertion   . Rib fracture 09/05/2014  . HX: breast cancer 09/26/2013  . Depression 11/20/2012  . Unspecified constipation 11/20/2012  . Hypercholesterolemia 11/20/2012  . Essential hypertension, benign 11/20/2012  . Postoperative anemia due to acute blood loss 11/01/2012  . Hyponatremia 11/01/2012  . Hypokalemia 11/01/2012  . OA (osteoarthritis) of knee 10/29/2012  . Septic joint of right knee joint ([place]) 05/23/2012  . Elevated liver enzymes 05/23/2012    Past Surgical History:  Procedure Laterality Date  . BREAST LUMPECTOMY Left 1996  . I&D EXTREMITY  04/17/2012   Procedure: IRRIGATION AND DEBRIDEMENT EXTREMITY;  Surgeon: Rudean Haskell, MD;  Location: [facility];  Service: Orthopedics;  Laterality: Right;  . KNEE ARTHROSCOPY  04/17/2012   Procedure: ARTHROSCOPY KNEE;  Surgeon: Rudean Haskell, MD;  Location: [facility];  Service: Orthopedics;  Laterality: Right;  . KYPHOPLASTY  2009, 2013   thorasic, lumbar  . MENISECTOMY Right 04/11/2012  . ORIF PERIPROSTHETIC FRACTURE Right 05/03/2018   Procedure: RIGHT RETROGRADE FEMORAL NAIL;  Surgeon: Rod Can, MD;  [facility];  Service: Orthopedics;  Laterality: Right;  . POSTERIOR LAMINECTOMY /  DECOMPRESSION LUMBAR SPINE  1980's  . rotator cuff surgery  Right   . TONSILLECTOMY AND ADENOIDECTOMY     "I was a child"  . TOTAL KNEE ARTHROPLASTY Right 10/29/2012   Procedure: RIGHT TOTAL KNEE ARTHROPLASTY;  Surgeon: Gearlean Alf, MD;  [facility];  Service: Orthopedics;  Laterality: Right;  . VEIN LIGATION       OB History   No obstetric history on file.      Home Medications    Prior to Admission medications   Medication Sig Start Date End Date Taking? Authorizing Provider  acetaminophen  (TYLENOL) 500 MG tablet Take 1,000 mg by mouth daily as needed (back pain).   Yes [provider]  alendronate (FOSAMAX) 70 MG tablet Take 70 mg by mouth once a week. Every Sunday. 08/21/13  Yes [provider]  Calcium Carbonate-Vitamin D (CALCIUM-D) 600-400 MG-UNIT TABS Take 1 tablet by mouth daily.   Yes [provider]  docusate sodium (COLACE) 100 MG capsule Take 1 capsule (100 mg total) by mouth 2 (two) times daily. 05/08/18  Yes Georgette Shell, MD  Multiple Vitamins-Minerals (MULTIVITAMIN ADULT) TABS Take 1 tablet by mouth daily.  08/17/09  Yes [provider]  potassium chloride (K-DUR) 10 MEQ tablet Take 1 tablet (10 mEq total) by mouth daily. 07/05/18  Yes Croitoru, Mihai, MD  triamterene-hydrochlorothiazide (MAXZIDE) 75-50 MG tablet TAKE 1 TABLET BY MOUTH DAILY. KEEP OV. 01/07/19  Yes Croitoru, Mihai, MD  escitalopram (LEXAPRO) 10 MG tablet Take 20 mg by mouth daily.     [provider]  simvastatin (ZOCOR) 20 MG tablet Take 1 tablet (20 mg total) by mouth daily. 05/21/18   Almyra Deforest, PA  tamsulosin (FLOMAX) 0.4 MG CAPS capsule Take 0.4 mg by mouth daily.    [provider]    Family History Family History  Problem Relation Age of Onset  . Kidney failure Mother   . Anuerysm Father        AAA    Social History Social History   Tobacco Use  . Smoking status: Never Smoker  . Smokeless tobacco: Never Used  Substance Use Topics  . Alcohol use: No  . Drug use: No     Allergies   Oysters [shellfish allergy] and Codeine   Review of Systems Review of Systems  All other systems reviewed and are negative.    Physical Exam Updated Vital Signs BP (!) 133/57 (BP Location: Right Arm)   Pulse 68   Temp 98.8 F (37.1 C) (Oral)   Resp 18   Ht 1.676 m (5\' 6" )   Wt 65.8 kg   SpO2 99%   BMI 23.40 kg/m   Physical Exam Vitals signs and nursing note reviewed.  Constitutional:      General: She is not in acute  distress.    Appearance: She is well-developed.  HENT:     Head: Normocephalic and atraumatic.     Right Ear: External ear normal.     Left Ear: External ear normal.  Eyes:     General: No scleral icterus.       Right eye: No discharge.        Left eye: No discharge.     Conjunctiva/sclera: Conjunctivae normal.  Neck:     Musculoskeletal: Neck supple.     Trachea: No tracheal deviation.  Cardiovascular:     Rate and Rhythm: Normal rate.  Pulmonary:     Effort: Pulmonary effort is normal. No respiratory distress.  Breath sounds: No stridor.  Abdominal:     General: There is no distension.  Musculoskeletal:        General: No swelling or deformity.     Right ankle: She exhibits swelling. Tenderness.     Right foot: Swelling present. No deformity.     Comments: Extremities are warm and well perfused, no cyanosis.  Skin:    General: Skin is warm and dry.     Findings: No rash.  Neurological:     Mental Status: She is alert.     Cranial Nerves: Cranial nerve deficit: no gross deficits.      ED Treatments / Results  Labs (all labs ordered are listed, but only abnormal results are displayed) Labs Reviewed - No data to display  EKG None  Radiology Dg Ankle Complete Right  Result Date: 02/16/2019 CLINICAL DATA:  Posttraumatic right ankle pain EXAM: RIGHT ANKLE - COMPLETE 3+ VIEW COMPARISON:  None. FINDINGS: Soft tissue swelling without acute fracture or dislocation. Heterotopic/dystrophic ossification in the lower leg. IMPRESSION: Soft tissue swelling without fracture. Electronically Signed   By: Monte Fantasia M.D.   On: 02/16/2019 10:51   Dg Foot Complete Right  Result Date: 02/16/2019 CLINICAL DATA:  Posttraumatic right ankle pain.  Initial encounter. EXAM: RIGHT FOOT COMPLETE - 3+ VIEW COMPARISON:  None. FINDINGS: There is no evidence of fracture or dislocation. Generalized osteopenia. Anterior soft tissue swelling at the ankle where there is heterotopic  ossification. IMPRESSION: Negative for fracture or dislocation. Electronically Signed   By: Monte Fantasia M.D.   On: 02/16/2019 10:50    Procedures Procedures (including critical care time)  Medications Ordered in ED Medications  acetaminophen (TYLENOL) tablet 650 mg (has no administration in time range)     Initial Impression / Assessment and Plan / ED Course  I have reviewed the triage vital signs and the nursing notes.  Pertinent labs & imaging results that were available during my care of the patient were reviewed by me and considered in my medical decision making (see chart for details).     X-rays are without signs of any fracture or dislocation.  Symptoms are consistent with an ankle sprain.  Patient will be given an ASO splint.  Patient states she has plans to see her orthopedic doctor this coming week to have a routine evaluation of her knee.  She states she will talk to her orthopedic doctor if she continues to have persistent symptoms. Final Clinical Impressions(s) / ED Diagnoses   Final diagnoses:  Sprain of right ankle, unspecified ligament, initial encounter    ED Discharge Orders    None       Dorie Rank, MD 02/16/19 1309

## 2019-02-22 DIAGNOSIS — S93401A Sprain of unspecified ligament of right ankle, initial encounter: Secondary | ICD-10-CM | POA: Diagnosis not present

## 2019-02-22 DIAGNOSIS — M25562 Pain in left knee: Secondary | ICD-10-CM | POA: Diagnosis not present

## 2019-02-22 DIAGNOSIS — Z96651 Presence of right artificial knee joint: Secondary | ICD-10-CM | POA: Diagnosis not present

## 2019-03-18 ENCOUNTER — Other Ambulatory Visit: Payer: Self-pay

## 2019-03-18 ENCOUNTER — Ambulatory Visit
Admission: RE | Admit: 2019-03-18 | Discharge: 2019-03-18 | Disposition: A | Discharge status: Home / Self Care | Payer: Medicare Other | Source: Ambulatory Visit | Attending: Internal Medicine | Admitting: Internal Medicine

## 2019-03-18 DIAGNOSIS — Z1231 Encounter for screening mammogram for malignant neoplasm of breast: Secondary | ICD-10-CM | POA: Diagnosis not present

## 2019-04-17 ENCOUNTER — Ambulatory Visit: Payer: Medicare Other | Admitting: Cardiovascular Disease

## 2019-04-17 ENCOUNTER — Other Ambulatory Visit: Payer: Self-pay

## 2019-04-17 VITALS — BP 146/80 | HR 59 | Temp 98.4°F | Ht 66.5 in | Wt 144.4 lb

## 2019-04-17 DIAGNOSIS — I472 Ventricular tachycardia: Secondary | ICD-10-CM

## 2019-04-17 DIAGNOSIS — I10 Essential (primary) hypertension: Secondary | ICD-10-CM | POA: Diagnosis not present

## 2019-04-17 DIAGNOSIS — I519 Heart disease, unspecified: Secondary | ICD-10-CM

## 2019-04-17 DIAGNOSIS — I5189 Other ill-defined heart diseases: Secondary | ICD-10-CM

## 2019-04-17 DIAGNOSIS — I951 Orthostatic hypotension: Secondary | ICD-10-CM

## 2019-04-17 DIAGNOSIS — E7800 Pure hypercholesterolemia, unspecified: Secondary | ICD-10-CM

## 2019-04-17 DIAGNOSIS — I4729 Other ventricular tachycardia: Secondary | ICD-10-CM

## 2019-04-17 DIAGNOSIS — I872 Venous insufficiency (chronic) (peripheral): Secondary | ICD-10-CM

## 2019-04-17 NOTE — Progress Notes (Signed)
Cardiology Office Note    Date:  04/19/2019   ID:  Pamela, Davis [birth year], MRN [id number]  PCP:  Burnard Bunting, MD  Cardiologist:   Sanda Klein, MD   No chief complaint on file.   History of Present Illness:  [name] is a 85 y.o. female with paroxysmal atrial tachycardia orthostatic hypotension, hyperlipidemia and lower extremity edema due to venous insufficiency, returning for follow-up.   A year ago she had a mechanical fall complicated by a right supracondylar femoral fracture, treated surgically.  She had some issues with sinus bradycardia and borderline hypotension during that hospitalization.  PACs and PVCs were seen.  She has slowly recovered since then.  She is still using a cane.  She has not had any new falls.  She has occasional mild orthostatic dizziness.  She has a remote history of DVT and has prominent bilateral varicose veins.  She had bilateral saphenectomy in the past.  The patient specifically denies any chest pain at rest exertion, dyspnea at rest or with exertion, orthopnea, paroxysmal nocturnal dyspnea, syncope, palpitations, focal neurological deficits, intermittent claudication, lower extremity edema, unexplained weight gain, cough, hemoptysis or wheezing.  At her May 13, 2018 follow-up episode she did complain of some right sided chest discomfort and a Lexiscan Myoview was ordered.  The symptoms did not sound distinctly anginal.  They did not recur and she canceled the nuclear stress test.  On the event monitor performed in August 2019, she did not have any evidence of serious arrhythmia.  No major bradycardic events and a single episode of nonsustained VT that occurred when she almost had a motor vehicle accident; with a a maximum heart rate achieved was 106 bpm which is 78% of the maximum predicted for her age  In March 2016, her echocardiogram showed normal findings with the exception of moderate left atrial enlargement  and mild diastolic left ventricular dysfunction.  October 2019 echo was essentially unchanged, EF [id number], systolic PA pressure mildly increased 38 mmHg.  Past Medical History:  Diagnosis Date  . Arthritis    "in my back"  . Breast cancer ([place]) 1996   left breast cancer   . Complication of anesthesia 04/18/2012   "didn't tolerate it today very well; had the shakes and very hard time w/it"  . Depression 11/20/2012  . DVT (deep venous thrombosis) ([place])   . High cholesterol   . History of alcoholism ([place])    7 1/2 years clean  . Hypertension   . Osteoporosis   . Peripheral vascular disease (HCC)    hx of ligation   . Personal history of radiation therapy 1993   Left Breast Cancer  . Spinal stenosis     Past Surgical History:  Procedure Laterality Date  . BREAST LUMPECTOMY Left 1996  . I&D EXTREMITY  04/17/2012   Procedure: IRRIGATION AND DEBRIDEMENT EXTREMITY;  Surgeon: Rudean Haskell, MD;  Location: [facility];  Service: Orthopedics;  Laterality: Right;  . KNEE ARTHROSCOPY  04/17/2012   Procedure: ARTHROSCOPY KNEE;  Surgeon: Rudean Haskell, MD;  Location: [facility];  Service: Orthopedics;  Laterality: Right;  . KYPHOPLASTY  2009, 2013   thorasic, lumbar  . MENISECTOMY Right 04/11/2012  . ORIF PERIPROSTHETIC FRACTURE Right 05/03/2018   Procedure: RIGHT RETROGRADE FEMORAL NAIL;  Surgeon: Rod Can, MD;  Location: [facility];  Service: Orthopedics;  Laterality: Right;  . POSTERIOR LAMINECTOMY / DECOMPRESSION LUMBAR SPINE  1980's  . rotator cuff surgery  Right   .  TONSILLECTOMY AND ADENOIDECTOMY     "I was a child"  . TOTAL KNEE ARTHROPLASTY Right 10/29/2012   Procedure: RIGHT TOTAL KNEE ARTHROPLASTY;  Surgeon: Gearlean Alf, MD;  [facility];  Service: Orthopedics;  Laterality: Right;  . VEIN LIGATION      Current Medications: Outpatient Medications Prior to Visit  Medication Sig Dispense Refill  . acetaminophen (TYLENOL) 500 MG tablet Take 1,000 mg by mouth daily as needed (back  pain).    Marland Kitchen alendronate (FOSAMAX) 70 MG tablet Take 70 mg by mouth once a week. Every Sunday.    . Calcium Carbonate-Vitamin D (CALCIUM-D) 600-400 MG-UNIT TABS Take 1 tablet by mouth daily.    Marland Kitchen docusate sodium (COLACE) 100 MG capsule Take 1 capsule (100 mg total) by mouth 2 (two) times daily. 10 capsule 0  . escitalopram (LEXAPRO) 10 MG tablet Take 20 mg by mouth daily.     . Multiple Vitamins-Minerals (MULTIVITAMIN ADULT) TABS Take 1 tablet by mouth daily.     . potassium chloride (K-DUR) 10 MEQ tablet Take 1 tablet (10 mEq total) by mouth daily. 90 tablet 3  . simvastatin (ZOCOR) 20 MG tablet Take 1 tablet (20 mg total) by mouth daily. 90 tablet 1  . tamsulosin (FLOMAX) 0.4 MG CAPS capsule Take 0.4 mg by mouth daily.    Marland Kitchen triamterene-hydrochlorothiazide (MAXZIDE) 75-50 MG tablet TAKE 1 TABLET BY MOUTH DAILY. KEEP OV. 90 tablet 0   No facility-administered medications prior to visit.      Allergies:   Oysters [shellfish allergy] and Codeine   Social History   Socioeconomic History  . Marital status: Married    Spouse name: Not on file  . Number of children: 2  . Years of education: Not on file  . Highest education level: Not on file  Occupational History  . Occupation: retired  Scientific laboratory technician  . Financial resource strain: Not on file  . Food insecurity    Worry: Not on file    Inability: Not on file  . Transportation needs    Medical: Not on file    Non-medical: Not on file  Tobacco Use  . Smoking status: Never Smoker  . Smokeless tobacco: Never Used  Substance and Sexual Activity  . Alcohol use: No  . Drug use: No  . Sexual activity: Not on file  Lifestyle  . Physical activity    Days per week: Not on file    Minutes per session: Not on file  . Stress: Not on file  Relationships  . Social Herbalist on phone: Not on file    Gets together: Not on file    Attends religious service: Not on file    Active member of club or organization: Not on file     Attends meetings of clubs or organizations: Not on file    Relationship status: Not on file  Other Topics Concern  . Not on file  Social History Narrative  . Not on file     Family History:  The patient's family history includes Anuerysm in her father; Kidney failure in her mother.   ROS:   Please see the history of present illness.    ROS all other systems are reviewed and are negative  PHYSICAL EXAM:   VS:  BP (!) 146/80   Pulse (!) 59   Temp 98.4 F (36.9 C) (Temporal)   Ht 5' 6.5" (1.689 m)   Wt 144 lb 6.4 oz (65.5 kg)  SpO2 97%   BMI 22.96 kg/m     Rechecked her blood pressure sitting down 121/72 mmHg, standing up 109/66 mmHg.   General: Alert, oriented x3, no distress, prominent thoracic kyphosis Head: no evidence of trauma, PERRL, EOMI, no exophtalmos or lid lag, no myxedema, no xanthelasma; normal ears, nose and oropharynx Neck: normal jugular venous pulsations and no hepatojugular reflux; brisk carotid pulses without delay and no carotid bruits Chest: clear to auscultation, no signs of consolidation by percussion or palpation, normal fremitus, symmetrical and full respiratory excursions Cardiovascular: normal position and quality of the apical impulse, regular rhythm, normal first and widely split second heart sounds, no murmurs, rubs or gallops Abdomen: no tenderness or distention, no masses by palpation, no abnormal pulsatility or arterial bruits, normal bowel sounds, no hepatosplenomegaly Extremities: no clubbing, cyanosis or edema; 2+ radial, ulnar and brachial pulses bilaterally; 2+ right femoral, posterior tibial and dorsalis pedis pulses; 2+ left femoral, posterior tibial and dorsalis pedis pulses; no subclavian or femoral bruits Neurological: grossly nonfocal Psych: Normal mood and affect   Wt Readings from Last 3 Encounters:  04/17/19 144 lb 6.4 oz (65.5 kg)  02/16/19 145 lb (65.8 kg)  09/25/18 150 lb 12.8 oz (68.4 kg)    Studies/Labs Reviewed:   EKG:   EKG is ordered today.  It shows normal sinus rhythm with a single PVC and right bundle branch block.  Normal repolarization, QTC 447 ms  LABS: BMET    Component Value Date/Time   NA 140 08/06/2018 0815   K 4.5 08/06/2018 0815   CL 100 08/06/2018 0815   CO2 25 08/06/2018 0815   GLUCOSE 78 08/06/2018 0815   GLUCOSE 102 (H) 05/07/2018 0540   BUN 32 (H) 08/06/2018 0815   CREATININE 0.78 08/06/2018 0815   CREATININE 0.75 06/13/2012 1057   CALCIUM 10.3 08/06/2018 0815   GFRNONAA 70 08/06/2018 0815   GFRNONAA 88 05/23/2012 1337   GFRAA 81 08/06/2018 0815   GFRAA >89 05/23/2012 1337   Lipid Panel  No results found for: CHOL, TRIG, HDL, CHOLHDL, VLDL, LDLCALC, LDLDIRECT March 27, 2018 Cholesterol 182, HDL 68, LDL 94, triglycerides 98 Hemoglobin A1c 5.7%  August 06, 2018 Creatinine 0.78, potassium 4.5  September 26, 2018 Hemoglobin A1c 5.6%  ASSESSMENT:    1. NSVT (nonsustained ventricular tachycardia) ([place])   2. Essential hypertension, benign   3. Orthostatic hypotension   4. Echocardiography shows left ventricular diastolic dysfunction   5. Peripheral venous insufficiency   6. Hypercholesterolemia     PLAN:  In order of problems listed above:  .   1. Orthostatic hypotension: Avoid potent loop diuretics. 2. HTN: Well-controlled.  Still has a greater than 10 mmHg drop in orthostatic blood pressure. 3. PVC/NSVT: PVCs have always been asymptomatic.  Her one episode of detected nonsustained VT occurred during extreme emotional event.  Avoid beta-blockers due to her tendency to bradycardia. 4. LV diastolic dysfunction: Reported on her echocardiogram, but the only time she had clinical evidence of volume overload was immediately after her surgery for her femoral fracture. 5. Peripheral venous insufficiency: Preferentially to be addressed with conservative measures such as leg elevation and compression stockings, rather than diuretics. 6. HLP: she will have her follow-up "physical"  and labs with Dr. Reynaldo Minium next month  Medication Adjustments/Labs and Tests Ordered: Current medicines are reviewed at length with the patient today.  Concerns regarding medicines are outlined above.  Medication changes, Labs and Tests ordered today are listed in the Patient Instructions below. Patient Instructions  Medication Instructions:  Your physician recommends that you continue on your current medications as directed. Please refer to the Current Medication list given to you today.  If you need a refill on your cardiac medications before your next appointment, please call your pharmacy.   Lab work: None ordered If you have labs (blood work) drawn today and your tests are completely normal, you will receive your results only by: [facility] (if you have MyChart) OR A paper copy in the mail If you have any lab test that is abnormal or we need to change your treatment, we will call you to review the results.  Testing/Procedures: None ordered  Follow-Up: At [facility], you and your health needs are our priority.  As part of our continuing mission to provide you with exceptional heart care, we have created designated Provider Care Teams.  These Care Teams include your primary Cardiologist (physician) and Advanced Practice Providers (APPs -  Physician Assistants and Nurse Practitioners) who all work together to provide you with the care you need, when you need it. You will need a follow up appointment in 12 months.  Please call our office 2 months in advance to schedule this appointment.  You may see Sanda Klein, MD or one of the following Advanced Practice Providers on your designated Care Team: Almyra Deforest, PA-C Fabian Sharp, [place]        Signed, Sanda Klein, MD  04/19/2019 3:45 PM    [address], [place], [place]  [address] Phone: [phone]; Fax: [phone]

## 2019-04-17 NOTE — Patient Instructions (Signed)

## 2019-04-18 ENCOUNTER — Telehealth: Payer: Self-pay

## 2019-04-18 DIAGNOSIS — M859 Disorder of bone density and structure, unspecified: Secondary | ICD-10-CM | POA: Diagnosis not present

## 2019-04-18 DIAGNOSIS — E1169 Type 2 diabetes mellitus with other specified complication: Secondary | ICD-10-CM | POA: Diagnosis not present

## 2019-04-18 DIAGNOSIS — E7849 Other hyperlipidemia: Secondary | ICD-10-CM | POA: Diagnosis not present

## 2019-04-18 NOTE — Telephone Encounter (Signed)
Not needed

## 2019-04-19 ENCOUNTER — Encounter: Payer: Self-pay | Admitting: Cardiovascular Disease

## 2019-04-24 DIAGNOSIS — E785 Hyperlipidemia, unspecified: Secondary | ICD-10-CM | POA: Diagnosis not present

## 2019-04-24 DIAGNOSIS — E1169 Type 2 diabetes mellitus with other specified complication: Secondary | ICD-10-CM | POA: Diagnosis not present

## 2019-04-24 DIAGNOSIS — Z0000 Encounter for general adult medical examination without abnormal findings: Secondary | ICD-10-CM | POA: Diagnosis not present

## 2019-04-24 DIAGNOSIS — R82998 Other abnormal findings in urine: Secondary | ICD-10-CM | POA: Diagnosis not present

## 2019-04-24 DIAGNOSIS — I10 Essential (primary) hypertension: Secondary | ICD-10-CM | POA: Diagnosis not present

## 2019-04-25 DIAGNOSIS — M1712 Unilateral primary osteoarthritis, left knee: Secondary | ICD-10-CM | POA: Diagnosis not present

## 2019-04-25 DIAGNOSIS — M25562 Pain in left knee: Secondary | ICD-10-CM | POA: Diagnosis not present

## 2019-05-03 DIAGNOSIS — Z1212 Encounter for screening for malignant neoplasm of rectum: Secondary | ICD-10-CM | POA: Diagnosis not present

## 2019-05-21 ENCOUNTER — Emergency Department (HOSPITAL_BASED_OUTPATIENT_CLINIC_OR_DEPARTMENT_OTHER)
Admission: EM | Admit: 2019-05-21 | Discharge: 2019-05-21 | Disposition: A | Discharge status: Home / Self Care | Payer: Medicare Other | Attending: Emergency Medicine | Admitting: Emergency Medicine

## 2019-05-21 ENCOUNTER — Emergency Department (HOSPITAL_BASED_OUTPATIENT_CLINIC_OR_DEPARTMENT_OTHER): Payer: Medicare Other

## 2019-05-21 ENCOUNTER — Encounter (HOSPITAL_BASED_OUTPATIENT_CLINIC_OR_DEPARTMENT_OTHER): Payer: Self-pay

## 2019-05-21 ENCOUNTER — Other Ambulatory Visit: Payer: Self-pay

## 2019-05-21 DIAGNOSIS — Y92009 Unspecified place in unspecified non-institutional (private) residence as the place of occurrence of the external cause: Secondary | ICD-10-CM | POA: Diagnosis not present

## 2019-05-21 DIAGNOSIS — Z79899 Other long term (current) drug therapy: Secondary | ICD-10-CM | POA: Insufficient documentation

## 2019-05-21 DIAGNOSIS — Z96651 Presence of right artificial knee joint: Secondary | ICD-10-CM | POA: Insufficient documentation

## 2019-05-21 DIAGNOSIS — S2232XA Fracture of one rib, left side, initial encounter for closed fracture: Secondary | ICD-10-CM | POA: Diagnosis not present

## 2019-05-21 DIAGNOSIS — I119 Hypertensive heart disease without heart failure: Secondary | ICD-10-CM | POA: Insufficient documentation

## 2019-05-21 DIAGNOSIS — W1809XA Striking against other object with subsequent fall, initial encounter: Secondary | ICD-10-CM | POA: Diagnosis not present

## 2019-05-21 DIAGNOSIS — W101XXA Fall (on)(from) sidewalk curb, initial encounter: Secondary | ICD-10-CM | POA: Insufficient documentation

## 2019-05-21 DIAGNOSIS — R42 Dizziness and giddiness: Secondary | ICD-10-CM | POA: Diagnosis not present

## 2019-05-21 DIAGNOSIS — Y9389 Activity, other specified: Secondary | ICD-10-CM | POA: Insufficient documentation

## 2019-05-21 DIAGNOSIS — S0990XA Unspecified injury of head, initial encounter: Secondary | ICD-10-CM | POA: Diagnosis not present

## 2019-05-21 DIAGNOSIS — Z853 Personal history of malignant neoplasm of breast: Secondary | ICD-10-CM | POA: Diagnosis not present

## 2019-05-21 DIAGNOSIS — Y999 Unspecified external cause status: Secondary | ICD-10-CM | POA: Diagnosis not present

## 2019-05-21 DIAGNOSIS — S299XXA Unspecified injury of thorax, initial encounter: Secondary | ICD-10-CM | POA: Diagnosis present

## 2019-05-21 DIAGNOSIS — W19XXXA Unspecified fall, initial encounter: Secondary | ICD-10-CM

## 2019-05-21 DIAGNOSIS — R519 Headache, unspecified: Secondary | ICD-10-CM | POA: Diagnosis not present

## 2019-05-21 NOTE — ED Provider Notes (Signed)
[facility] EMERGENCY DEPARTMENT Provider Note   CSN: [id number] Arrival date & time: 05/21/19  1535     History   Chief Complaint Chief Complaint  Patient presents with  . Fall    HPI [name] is a 85 y.o. female.     HPI   Yesterday tripped going down the curb, fell onto buttocks and backwards and hit head. Did not have any headache, dizziness or nausea yesterday. No significant pain following this fall.   Today went to open curtains to living room and fell backwards, but fell into the wall and then fell down, was trying to balance self with curtain cord which did not work.   Someone comes Tues to help around the house, when walking back to the kitchen started to feel nauseated, but didn't hit head today.  No headache today. Episode of nausea without vomiting.  Hit the left ribs on the furniture today, felt better after taking tylenol. Hurt more with breathing and movements/palpation before but improved now after tylenol.  No other shortness of breath, chest pain.  Otherwise not having sensation of being off balance, no numbness/weakness/facial droop/no trouble walking. Uses cane when outside of the house, or if walking longer distance  Not on anticoagulation  Past Medical History:  Diagnosis Date  . Arthritis    "in my back"  . Breast cancer ([place]) 1996   left breast cancer   . Complication of anesthesia 04/18/2012   "didn't tolerate it today very well; had the shakes and very hard time w/it"  . Depression 11/20/2012  . DVT (deep venous thrombosis) ([place])   . High cholesterol   . History of alcoholism ([place])    7 1/2 years clean  . Hypertension   . Melanoma ([place]) 01/15/2004   upper right arm  . Osteoporosis   . Peripheral vascular disease (HCC)    hx of ligation   . Personal history of radiation therapy 1993   Left Breast Cancer  . SCC (squamous cell carcinoma) 01/15/2004   left post upper arm, Right elbow, post left knee  . SCC  (squamous cell carcinoma) 05/21/2013   right forearm, right jawline, left cheek  . SCC (squamous cell carcinoma) 08/24/2016   right shin,right thigh  . SCC (squamous cell carcinoma) 11/01/2016  . SCC (squamous cell carcinoma) 02/02/2018   left outer arm,right outer thigh  . SCC (squamous cell carcinoma) 12/13/2018   right jawline  . Spinal stenosis     Patient Active Problem List   Diagnosis Date Noted  . NSVT (nonsustained ventricular tachycardia) ([place]) 09/26/2018  . Bradycardia   . Displaced supracondylar fracture of distal end of right femur without intracondylar extension ([place]) 05/04/2018  . Postop check   . Pain   . Femur fracture, right ([facility]) 05/02/2018  . Prolonged QT interval 05/02/2018  . [place] 05/02/2018  . Left ventricular diastolic dysfunction with preserved systolic function [id number]  . PAT (paroxysmal atrial tachycardia) ([place]) 03/24/2016  . Diastolic dysfunction without heart failure 03/24/2016  . Peripheral venous insufficiency 03/24/2016  . Closed wedge compression fracture of fourth lumbar vertebra ([place])   . Near syncope 10/20/2014  . Orthostatic hypotension 10/20/2014  . Fatigue 10/19/2014  . Syncope 10/19/2014  . Generalized weakness   . Elevated troponin   . Dyspnea on exertion   . Rib fracture 09/05/2014  . HX: breast cancer 09/26/2013  . Depression 11/20/2012  . Unspecified constipation 11/20/2012  . Hypercholesterolemia 11/20/2012  . Essential hypertension, benign 11/20/2012  .  Postoperative anemia due to acute blood loss 11/01/2012  . Hyponatremia 11/01/2012  . Hypokalemia 11/01/2012  . OA (osteoarthritis) of knee 10/29/2012  . Septic joint of right knee joint ([place]) 05/23/2012  . Elevated liver enzymes 05/23/2012    Past Surgical History:  Procedure Laterality Date  . BREAST LUMPECTOMY Left 1996  . I&D EXTREMITY  04/17/2012   Procedure: IRRIGATION AND DEBRIDEMENT EXTREMITY;  Surgeon: Rudean Haskell, MD;  Location: [facility];  Service:  Orthopedics;  Laterality: Right;  . KNEE ARTHROSCOPY  04/17/2012   Procedure: ARTHROSCOPY KNEE;  Surgeon: Rudean Haskell, MD;  Location: [facility];  Service: Orthopedics;  Laterality: Right;  . KYPHOPLASTY  2009, 2013   thorasic, lumbar  . MENISECTOMY Right 04/11/2012  . ORIF PERIPROSTHETIC FRACTURE Right 05/03/2018   Procedure: RIGHT RETROGRADE FEMORAL NAIL;  Surgeon: Rod Can, MD;  [facility];  Service: Orthopedics;  Laterality: Right;  . POSTERIOR LAMINECTOMY / DECOMPRESSION LUMBAR SPINE  1980's  . rotator cuff surgery  Right   . TONSILLECTOMY AND ADENOIDECTOMY     "I was a child"  . TOTAL KNEE ARTHROPLASTY Right 10/29/2012   Procedure: RIGHT TOTAL KNEE ARTHROPLASTY;  Surgeon: Gearlean Alf, MD;  [facility];  Service: Orthopedics;  Laterality: Right;  . VEIN LIGATION       OB History   No obstetric history on file.      Home Medications    Prior to Admission medications   Medication Sig Start Date End Date Taking? Authorizing Provider  acetaminophen (TYLENOL) 500 MG tablet Take 1,000 mg by mouth daily as needed (back pain).    [provider]  alendronate (FOSAMAX) 70 MG tablet Take 70 mg by mouth once a week. Every Sunday. 08/21/13   [provider]  Calcium Carbonate-Vitamin D (CALCIUM-D) 600-400 MG-UNIT TABS Take 1 tablet by mouth daily.    [provider]  docusate sodium (COLACE) 100 MG capsule Take 1 capsule (100 mg total) by mouth 2 (two) times daily. 05/08/18   Georgette Shell, MD  escitalopram (LEXAPRO) 10 MG tablet Take 20 mg by mouth daily.     [provider]  Multiple Vitamins-Minerals (MULTIVITAMIN ADULT) TABS Take 1 tablet by mouth daily.  08/17/09   [provider]  potassium chloride (K-DUR) 10 MEQ tablet Take 1 tablet (10 mEq total) by mouth daily. 07/05/18   Croitoru, Mihai, MD  simvastatin (ZOCOR) 20 MG tablet Take 1 tablet (20 mg total) by mouth daily. 05/21/18   Almyra Deforest, PA  tamsulosin (FLOMAX)  0.4 MG CAPS capsule Take 0.4 mg by mouth daily.    [provider]  triamterene-hydrochlorothiazide (MAXZIDE) 75-50 MG tablet TAKE 1 TABLET BY MOUTH DAILY. KEEP OV. 01/07/19   Croitoru, Dani Gobble, MD    Family History Family History  Problem Relation Age of Onset  . Kidney failure Mother   . Anuerysm Father        AAA    Social History Social History   Tobacco Use  . Smoking status: Never Smoker  . Smokeless tobacco: Never Used  Substance Use Topics  . Alcohol use: No  . Drug use: No     Allergies   Oysters [shellfish allergy] and Codeine   Review of Systems Review of Systems  Constitutional: Negative for fever.  HENT: Negative for sore throat.   Eyes: Negative for visual disturbance.  Respiratory: Negative for cough and shortness of breath.   Cardiovascular: Positive for chest pain (left rib pain after fall).  Gastrointestinal: Positive for nausea. Negative for abdominal pain.  Genitourinary: Negative for difficulty urinating.  Musculoskeletal: Negative for back pain (scoliosis but nothing new with fall), gait problem and neck pain.  Skin: Negative for rash.  Neurological: Negative for syncope, weakness, numbness and headaches.     Physical Exam Updated Vital Signs BP (!) 164/72 (BP Location: Right Arm)   Pulse 67   Temp 98.1 F (36.7 C)   Resp 20   Ht 5\' 6"  (1.676 m)   Wt 64.4 kg   SpO2 97%   BMI 22.92 kg/m   Physical Exam Vitals signs and nursing note reviewed.  Constitutional:      General: She is not in acute distress.    Appearance: She is well-developed. She is not diaphoretic.  HENT:     Head: Normocephalic and atraumatic.  Eyes:     Conjunctiva/sclera: Conjunctivae normal.  Neck:     Musculoskeletal: Normal range of motion.  Cardiovascular:     Rate and Rhythm: Normal rate and regular rhythm.     Heart sounds: Normal heart sounds. No murmur. No friction rub. No gallop.   Pulmonary:     Effort: Pulmonary effort is normal. No  respiratory distress.     Breath sounds: Normal breath sounds. No wheezing or rales.  Chest:     Chest wall: Tenderness present.  Abdominal:     General: There is no distension.     Palpations: Abdomen is soft.     Tenderness: There is no abdominal tenderness. There is no guarding.  Musculoskeletal:        General: No tenderness.  Skin:    General: Skin is warm and dry.     Findings: No erythema or rash.  Neurological:     Mental Status: She is alert and oriented to person, place, and time.     GCS: GCS eye subscore is 4. GCS verbal subscore is 5. GCS motor subscore is 6.     Sensory: Sensation is intact. No sensory deficit.     Motor: Motor function is intact. No weakness.      ED Treatments / Results  Labs (all labs ordered are listed, but only abnormal results are displayed) Labs Reviewed - No data to display  EKG None  Radiology No results found.  Procedures Procedures (including critical care time)  Medications Ordered in ED Medications - No data to display   Initial Impression / Assessment and Plan / ED Course  I have reviewed the triage vital signs and the nursing notes.  Pertinent labs & imaging results that were available during my care of the patient were reviewed by me and considered in my medical decision making (see chart for details).        85yo female with history above presents with concern for mechanical fall with episode of nausea and left rib pain. No neck pain, tenderness, neurologic abnormality, doubt cervical spine injury. Left rib XR shows probable left seventh rib fracture.  Her pain is controlled with tylenol, she is performing well on spirometry and after discussion with patient we agree that XR is sufficient and do not feel she requires admission for pain control/pulmonary toilet.  No other acute medical concerns or injuries. Patient discharged in stable condition with understanding of reasons to return.   Final Clinical Impressions(s) /  ED Diagnoses   Final diagnoses:  Fall, initial encounter  Closed fracture of one rib of left side, initial encounter    ED Discharge Orders  None       Gareth Morgan, MD 05/23/19 Joen Laura

## 2019-05-21 NOTE — ED Notes (Signed)
Tried calling pts spouse, NR at home number. No cell number listed

## 2019-05-21 NOTE — ED Notes (Signed)
Attempted to call pt home number again- no answer.

## 2019-05-21 NOTE — ED Notes (Signed)
Pt denies CP, denies sob.

## 2019-05-21 NOTE — ED Notes (Signed)
Pt husband called- will pick patient up shortly. Discharge information will be reviewed with pt and pt husband when he arrives.

## 2019-05-21 NOTE — ED Notes (Signed)
Searched parking lot for pt husband- can not find at this time. Pt sitting in wheel chair with warm blanket and does not appear to be in distress. Will continue to monitor and contact pt family.

## 2019-05-21 NOTE — ED Triage Notes (Signed)
Pt states she fell yesterday and today-states pain from today's fall is to left rib area-NAD-to triage in w/c

## 2019-05-22 ENCOUNTER — Encounter: Payer: Self-pay | Admitting: *Deleted

## 2019-06-03 DIAGNOSIS — L570 Actinic keratosis: Secondary | ICD-10-CM | POA: Diagnosis not present

## 2019-06-03 DIAGNOSIS — C4492 Squamous cell carcinoma of skin, unspecified: Secondary | ICD-10-CM | POA: Diagnosis not present

## 2019-06-06 DIAGNOSIS — M1712 Unilateral primary osteoarthritis, left knee: Secondary | ICD-10-CM | POA: Diagnosis not present

## 2019-06-13 DIAGNOSIS — M25562 Pain in left knee: Secondary | ICD-10-CM | POA: Diagnosis not present

## 2019-06-13 DIAGNOSIS — M1712 Unilateral primary osteoarthritis, left knee: Secondary | ICD-10-CM | POA: Diagnosis not present

## 2019-06-19 DIAGNOSIS — M1712 Unilateral primary osteoarthritis, left knee: Secondary | ICD-10-CM | POA: Diagnosis not present

## 2019-06-19 DIAGNOSIS — M25562 Pain in left knee: Secondary | ICD-10-CM | POA: Diagnosis not present

## 2019-07-11 ENCOUNTER — Other Ambulatory Visit: Payer: Self-pay | Admitting: Cardiovascular Disease

## 2019-07-17 ENCOUNTER — Emergency Department (HOSPITAL_BASED_OUTPATIENT_CLINIC_OR_DEPARTMENT_OTHER): Payer: Medicare Other

## 2019-07-17 ENCOUNTER — Other Ambulatory Visit: Payer: Self-pay

## 2019-07-17 ENCOUNTER — Emergency Department (HOSPITAL_BASED_OUTPATIENT_CLINIC_OR_DEPARTMENT_OTHER)
Admission: EM | Admit: 2019-07-17 | Discharge: 2019-07-17 | Disposition: A | Discharge status: Home / Self Care | Payer: Medicare Other | Attending: Emergency Medicine | Admitting: Emergency Medicine

## 2019-07-17 ENCOUNTER — Encounter (HOSPITAL_BASED_OUTPATIENT_CLINIC_OR_DEPARTMENT_OTHER): Payer: Self-pay | Admitting: *Deleted

## 2019-07-17 DIAGNOSIS — S01111A Laceration without foreign body of right eyelid and periocular area, initial encounter: Secondary | ICD-10-CM

## 2019-07-17 DIAGNOSIS — Y9301 Activity, walking, marching and hiking: Secondary | ICD-10-CM | POA: Diagnosis not present

## 2019-07-17 DIAGNOSIS — S0990XA Unspecified injury of head, initial encounter: Secondary | ICD-10-CM

## 2019-07-17 DIAGNOSIS — Y92019 Unspecified place in single-family (private) house as the place of occurrence of the external cause: Secondary | ICD-10-CM | POA: Insufficient documentation

## 2019-07-17 DIAGNOSIS — Y999 Unspecified external cause status: Secondary | ICD-10-CM | POA: Insufficient documentation

## 2019-07-17 DIAGNOSIS — Z79899 Other long term (current) drug therapy: Secondary | ICD-10-CM | POA: Diagnosis not present

## 2019-07-17 DIAGNOSIS — W2203XA Walked into furniture, initial encounter: Secondary | ICD-10-CM | POA: Diagnosis not present

## 2019-07-17 DIAGNOSIS — I10 Essential (primary) hypertension: Secondary | ICD-10-CM | POA: Insufficient documentation

## 2019-07-17 NOTE — Discharge Instructions (Addendum)
You were seen in the ER after head injury.  Head CT was normal.  Laceration was small and 2 Steri-Strips were used to approximate the edges.  Avoid getting the Steri-Strips wet.  These will fall off on their own with time and when they are ready to come off.  Keep monitoring the wound for signs of infection, return for redness warmth pus fevers.  Return to the ER for severe headache, vision changes or loss, any stroke symptoms

## 2019-07-17 NOTE — ED Notes (Signed)
Please call HUsband at home when pt is ready for pick up  [phone]

## 2019-07-17 NOTE — ED Triage Notes (Signed)
Pt c/o head injury x 3 hrs ago , denies LOC , lac to right eyebrow

## 2019-07-17 NOTE — ED Provider Notes (Signed)
[name] Provider Note   CSN: [id number] Arrival date & time: 07/17/19  1851     History Chief Complaint  Patient presents with  . Head Injury    [name] is a 85 y.o. female presents to the ER for evaluation of right head injury onset approximately 3 hours ago.  Patient states she was walking around her house when she knocked the right side of her forehead on a nearby cabinet.  Denies falling to the ground.  Denies any other injuries.  Reports local right eyebrow swelling with a small laceration with bruising.  Denies significant pain otherwise, headache, vision changes, dizziness, nausea, vomiting, neck pain.  Has been ambulatory without any other signs of injury.  No anticoagulants.  HPI     Past Medical History:  Diagnosis Date  . Arthritis    "in my back"  . Breast cancer ([place]) 1996   left breast cancer   . Complication of anesthesia 04/18/2012   "didn't tolerate it today very well; had the shakes and very hard time w/it"  . Depression 11/20/2012  . DVT (deep venous thrombosis) ([place])   . High cholesterol   . History of alcoholism ([place])    7 1/2 years clean  . Hypertension   . Melanoma ([place]) 01/15/2004   upper right arm  . Osteoporosis   . Peripheral vascular disease (HCC)    hx of ligation   . Personal history of radiation therapy 1993   Left Breast Cancer  . SCC (squamous cell carcinoma) 01/15/2004   left post upper arm, Right elbow, post left knee  . SCC (squamous cell carcinoma) 05/21/2013   right forearm, right jawline, left cheek  . SCC (squamous cell carcinoma) 08/24/2016   right shin,right thigh  . SCC (squamous cell carcinoma) 11/01/2016  . SCC (squamous cell carcinoma) 02/02/2018   left outer arm,right outer thigh  . SCC (squamous cell carcinoma) 12/13/2018   right jawline  . Spinal stenosis     Patient Active Problem List   Diagnosis Date Noted  . NSVT (nonsustained ventricular tachycardia) ([place])  09/26/2018  . Bradycardia   . Displaced supracondylar fracture of distal end of right femur without intracondylar extension ([place]) 05/04/2018  . Postop check   . Pain   . Femur fracture, right ([place]) 05/02/2018  . Prolonged QT interval 05/02/2018  . Bigeminy 05/02/2018  . Left ventricular diastolic dysfunction with preserved systolic function [id number]  . PAT (paroxysmal atrial tachycardia) ([place]) 03/24/2016  . Diastolic dysfunction without heart failure 03/24/2016  . Peripheral venous insufficiency 03/24/2016  . Closed wedge compression fracture of fourth lumbar vertebra ([place])   . Near syncope 10/20/2014  . Orthostatic hypotension 10/20/2014  . Fatigue 10/19/2014  . Syncope 10/19/2014  . Generalized weakness   . Elevated troponin   . Dyspnea on exertion   . Rib fracture 09/05/2014  . HX: breast cancer 09/26/2013  . Depression 11/20/2012  . Unspecified constipation 11/20/2012  . Hypercholesterolemia 11/20/2012  . Essential hypertension, benign 11/20/2012  . Postoperative anemia due to acute blood loss 11/01/2012  . Hyponatremia 11/01/2012  . Hypokalemia 11/01/2012  . OA (osteoarthritis) of knee 10/29/2012  . Septic joint of right knee joint ([place]) 05/23/2012  . Elevated liver enzymes 05/23/2012    Past Surgical History:  Procedure Laterality Date  . BREAST LUMPECTOMY Left 1996  . I & D EXTREMITY  04/17/2012   Procedure: IRRIGATION AND DEBRIDEMENT EXTREMITY;  Surgeon: Rudean Haskell, MD;  Location: [facility];  Service: Orthopedics;  Laterality: Right;  . KNEE ARTHROSCOPY  04/17/2012   Procedure: ARTHROSCOPY KNEE;  Surgeon: Rudean Haskell, MD;  Location: [facility];  Service: Orthopedics;  Laterality: Right;  . KYPHOPLASTY  2009, 2013   thorasic, lumbar  . MENISECTOMY Right 04/11/2012  . ORIF PERIPROSTHETIC FRACTURE Right 05/03/2018   Procedure: RIGHT RETROGRADE FEMORAL NAIL;  Surgeon: Rod Can, MD;  Location: [facility];  Service: Orthopedics;  Laterality: Right;  . POSTERIOR  LAMINECTOMY / DECOMPRESSION LUMBAR SPINE  1980's  . rotator cuff surgery  Right   . TONSILLECTOMY AND ADENOIDECTOMY     "I was a child"  . TOTAL KNEE ARTHROPLASTY Right 10/29/2012   Procedure: RIGHT TOTAL KNEE ARTHROPLASTY;  Surgeon: Gearlean Alf, MD;  Location: [facility];  Service: Orthopedics;  Laterality: Right;  . VEIN LIGATION       OB History   No obstetric history on file.     Family History  Problem Relation Age of Onset  . Kidney failure Mother   . Anuerysm Father        AAA    Social History   Tobacco Use  . Smoking status: Never Smoker  . Smokeless tobacco: Never Used  Substance Use Topics  . Alcohol use: No  . Drug use: No    Home Medications Prior to Admission medications   Medication Sig Start Date End Date Taking? Authorizing Provider  acetaminophen (TYLENOL) 500 MG tablet Take 1,000 mg by mouth daily as needed (back pain).    [provider]  alendronate (FOSAMAX) 70 MG tablet Take 70 mg by mouth once a week. Every Sunday. 08/21/13   [provider]  Calcium Carbonate-Vitamin D (CALCIUM-D) 600-400 MG-UNIT TABS Take 1 tablet by mouth daily.    [provider]  docusate sodium (COLACE) 100 MG capsule Take 1 capsule (100 mg total) by mouth 2 (two) times daily. 05/08/18   Georgette Shell, MD  escitalopram (LEXAPRO) 10 MG tablet Take 20 mg by mouth daily.     [provider]  Multiple Vitamins-Minerals (MULTIVITAMIN ADULT) TABS Take 1 tablet by mouth daily.  08/17/09   [provider]  potassium chloride (KLOR-CON) 10 MEQ tablet TAKE 1 TABLET BY MOUTH EVERY DAY 07/11/19   Croitoru, Mihai, MD  simvastatin (ZOCOR) 20 MG tablet Take 1 tablet (20 mg total) by mouth daily. 05/21/18   Almyra Deforest, PA  tamsulosin (FLOMAX) 0.4 MG CAPS capsule Take 0.4 mg by mouth daily.    [provider]  triamterene-hydrochlorothiazide (MAXZIDE) 75-50 MG tablet TAKE 1 TABLET BY MOUTH DAILY. KEEP OV. 01/07/19   Croitoru, Mihai, MD     Allergies    Oysters [shellfish allergy] and Codeine  Review of Systems   Review of Systems  Skin: Positive for color change and wound.  All other systems reviewed and are negative.   Physical Exam Updated Vital Signs BP (!) 160/80   Pulse 67   Temp 97.8 F (36.6 C)   Resp 18   Ht 5\' 6"  (1.676 m)   Wt 63.5 kg   SpO2 100%   BMI 22.60 kg/m   Physical Exam Vitals and nursing note reviewed.  Constitutional:      General: She is not in acute distress.    Appearance: She is well-developed.     Comments: NAD.  HENT:     Head: Normocephalic.     Comments: Contusion/ecchymosis on the right lateral eyebrow, some dependent ecchymosis and edema on the upper eyelid.  1  cm laceration right below the right lateral eyebrow, hemostatic, minimally tender.  Full range of motion of face bilaterally without any deficits.    Right Ear: External ear normal.     Left Ear: External ear normal.     Ears:     Comments: TMs normal, no hemotympanum.  No battle signs.    Nose: Nose normal.     Comments: No nasal bone tenderness.  Midface stable and nontender.    Mouth/Throat:     Comments: No intraoral or tongue injury.  No maxillary or mandibular tenderness. Eyes:     General: No scleral icterus.    Conjunctiva/sclera: Conjunctivae normal.      Comments: 1 cm laceration below the right lateral eyebrow with surrounding mild contusion/ecchymosis.  Locally tender around the laceration.  No other orbital bone tenderness.  Eyelids and eyelid margins intact.  Full EOMs without any pain.  PERRL bilaterally.  Neck:     Comments: No cervical collar upon arrival.  No midline or paraspinal muscle tenderness.  Full range motion of the neck. Cardiovascular:     Rate and Rhythm: Normal rate and regular rhythm.     Heart sounds: Normal heart sounds. No murmur.  Pulmonary:     Effort: Pulmonary effort is normal.     Breath sounds: Normal breath sounds. No wheezing.  Musculoskeletal:        General: No  deformity. Normal range of motion.     Cervical back: Normal range of motion and neck supple.     Comments: TL spine: No midline or paraspinal muscle tenderness.  Pelvis: No focal bony tenderness of hips bilaterally, full range motion of hips without any pain.  Skin:    General: Skin is warm and dry.     Capillary Refill: Capillary refill takes less than 2 seconds.  Neurological:     Mental Status: She is alert and oriented to person, place, and time.     Comments:  Mental Status: Patient is awake, alert, oriented to person, place, year, and situation. Patient is able to give a clear and coherent history.  Speech is fluent and clear without dysarthria or aphasia.  No signs of neglect.  Cranial Nerves: I not tested II visual fields full bilaterally. PERRL.  Unable to visualize posterior eye. III, IV, VI EOMs intact without ptosis or diplopia  V sensation to light touch intact in all 3 divisions of trigeminal nerve bilaterally  VII facial movements symmetric bilaterally VIII hearing intact to voice/conversation  IX, X no uvula deviation, symmetric rise of soft palate/uvula XI 5/5 SCM and trapezius strength bilaterally  XII tongue protrusion midline, symmetric L/R movements  Motor: Strength 5/5 in upper/lower extremities .   Sensation to light touch intact in face, upper/lower extremities. No pronator drift. No leg drop.  Cerebellar: No ataxia with finger to nose.   Psychiatric:        Behavior: Behavior normal.        Thought Content: Thought content normal.        Judgment: Judgment normal.     ED Results / Procedures / Treatments   Labs (all labs ordered are listed, but only abnormal results are displayed) Labs Reviewed - No data to display  EKG None  Radiology CT Head Wo Contrast  Result Date: 07/17/2019 CLINICAL DATA:  Head trauma right eyebrow contusion EXAM: CT HEAD WITHOUT CONTRAST TECHNIQUE: Contiguous axial images were obtained from the base of the skull  through the vertex without intravenous contrast. COMPARISON:  May 21, 2019 FINDINGS: Brain: No evidence of acute territorial infarction, hemorrhage, hydrocephalus,extra-axial collection or mass lesion/mass effect. There is dilatation the ventricles and sulci consistent with age-related atrophy. Low-attenuation changes in the deep white matter consistent with small vessel ischemia. Vascular: No hyperdense vessel or unexpected calcification. Skull: The skull is intact. No fracture or focal lesion identified. Sinuses/Orbits: The visualized paranasal sinuses and mastoid air cells are clear. The orbits and globes intact. Other: There is a small soft tissue hematoma seen overlying the right frontal skull measuring 3 cm in transverse dimension. There is also right periorbital soft tissue swelling. IMPRESSION: No acute intracranial abnormality. Findings consistent with age related atrophy and chronic small vessel ischemia Soft tissue hematoma overlying the right frontal skull. Electronically Signed   By: Prudencio Pair M.D.   On: 07/17/2019 20:50    Procedures .Marland KitchenLaceration Repair  Date/Time: 07/17/2019 9:19 PM Performed by: Kinnie Feil, PA-C Authorized by: Kinnie Feil, PA-C   Consent:    Consent obtained:  Verbal   Consent given by:  Patient   Risks discussed:  Infection, need for additional repair, pain, poor cosmetic result and poor wound healing   Alternatives discussed:  No treatment and delayed treatment Universal protocol:    Procedure explained and questions answered to patient or proxy's satisfaction: yes     Relevant documents present and verified: yes     Test results available and properly labeled: yes     Imaging studies available: yes     Required blood products, implants, devices, and special equipment available: yes     Site/side marked: yes     Immediately prior to procedure, a time out was called: yes     Patient identity confirmed:  Verbally with patient Anesthesia  (see MAR for exact dosages):    Anesthesia method:  None Laceration details:    Length (cm):  1 Repair type:    Repair type:  Simple Exploration:    Wound extent: no muscle damage noted and no underlying fracture noted     Contaminated: no   Treatment:    Area cleansed with:  Betadine   Amount of cleaning:  Standard   Irrigation solution:  Sterile saline   Irrigation method:  Tap Skin repair:    Repair method:  Steri-Strips   Number of Steri-Strips:  2 Approximation:    Approximation:  Close Post-procedure details:    Dressing:  Non-adherent dressing   (including critical care time)  Medications Ordered in ED Medications - No data to display  ED Course  I have reviewed the triage vital signs and the nursing notes.  Pertinent labs & imaging results that were available during my care of the patient were reviewed by me and considered in my medical decision making (see chart for details).    MDM Rules/Calculators/A&P                       85 year-old female presents with right eyebrow contusion and small laceration.  Low risk mechanism of injury.  No LOC.  No headache.  Exam is benign.  No other findings to suggest other physical trauma.  Head CT negative.   Per chart review last tetanus was in 2017.  Wound was irrigated by me.  Steri-Strips placed.  Asymptomatic upon reevaluation.  Will DC with wound care instructions, strict return precautions. Final Clinical Impression(s) / ED Diagnoses Final diagnoses:  Injury of head, initial encounter  Laceration of right eyebrow, initial encounter  Rx / DC Orders ED Discharge Orders    None       Arlean Hopping 07/17/19 2121    Lennice Sites, DO 07/17/19 2256

## 2019-07-30 ENCOUNTER — Other Ambulatory Visit: Payer: Self-pay | Admitting: Cardiovascular Disease

## 2019-08-19 DIAGNOSIS — D0472 Carcinoma in situ of skin of left lower limb, including hip: Secondary | ICD-10-CM | POA: Diagnosis not present

## 2019-08-19 DIAGNOSIS — L570 Actinic keratosis: Secondary | ICD-10-CM | POA: Diagnosis not present

## 2019-08-22 DIAGNOSIS — M8580 Other specified disorders of bone density and structure, unspecified site: Secondary | ICD-10-CM | POA: Diagnosis not present

## 2019-08-22 DIAGNOSIS — I10 Essential (primary) hypertension: Secondary | ICD-10-CM | POA: Diagnosis not present

## 2019-08-22 DIAGNOSIS — E1169 Type 2 diabetes mellitus with other specified complication: Secondary | ICD-10-CM | POA: Diagnosis not present

## 2019-08-22 DIAGNOSIS — E785 Hyperlipidemia, unspecified: Secondary | ICD-10-CM | POA: Diagnosis not present

## 2019-10-11 DIAGNOSIS — M1712 Unilateral primary osteoarthritis, left knee: Secondary | ICD-10-CM | POA: Diagnosis not present

## 2019-10-31 ENCOUNTER — Emergency Department (HOSPITAL_COMMUNITY): Payer: Medicare Other

## 2019-10-31 ENCOUNTER — Other Ambulatory Visit: Payer: Self-pay

## 2019-10-31 ENCOUNTER — Inpatient Hospital Stay (HOSPITAL_COMMUNITY)
Admission: EM | Admit: 2019-10-31 | Discharge: 2019-11-09 | DRG: 480 | Disposition: A | Discharge status: Skilled Nursing Facility | Payer: Medicare Other | Attending: Internal Medicine | Admitting: Internal Medicine

## 2019-10-31 ENCOUNTER — Encounter (HOSPITAL_COMMUNITY): Payer: Self-pay | Admitting: Emergency Medicine

## 2019-10-31 DIAGNOSIS — R110 Nausea: Secondary | ICD-10-CM | POA: Diagnosis not present

## 2019-10-31 DIAGNOSIS — Y92019 Unspecified place in single-family (private) house as the place of occurrence of the external cause: Secondary | ICD-10-CM

## 2019-10-31 DIAGNOSIS — Z419 Encounter for procedure for purposes other than remedying health state, unspecified: Secondary | ICD-10-CM

## 2019-10-31 DIAGNOSIS — I455 Other specified heart block: Secondary | ICD-10-CM | POA: Diagnosis present

## 2019-10-31 DIAGNOSIS — I429 Cardiomyopathy, unspecified: Secondary | ICD-10-CM | POA: Diagnosis not present

## 2019-10-31 DIAGNOSIS — Z923 Personal history of irradiation: Secondary | ICD-10-CM

## 2019-10-31 DIAGNOSIS — Z7401 Bed confinement status: Secondary | ICD-10-CM | POA: Diagnosis not present

## 2019-10-31 DIAGNOSIS — Z20822 Contact with and (suspected) exposure to covid-19: Secondary | ICD-10-CM | POA: Diagnosis not present

## 2019-10-31 DIAGNOSIS — F1020 Alcohol dependence, uncomplicated: Secondary | ICD-10-CM | POA: Diagnosis present

## 2019-10-31 DIAGNOSIS — I472 Ventricular tachycardia: Secondary | ICD-10-CM | POA: Diagnosis not present

## 2019-10-31 DIAGNOSIS — R262 Difficulty in walking, not elsewhere classified: Secondary | ICD-10-CM | POA: Diagnosis not present

## 2019-10-31 DIAGNOSIS — I471 Supraventricular tachycardia: Secondary | ICD-10-CM | POA: Diagnosis not present

## 2019-10-31 DIAGNOSIS — R7989 Other specified abnormal findings of blood chemistry: Secondary | ICD-10-CM | POA: Diagnosis present

## 2019-10-31 DIAGNOSIS — S72009A Fracture of unspecified part of neck of unspecified femur, initial encounter for closed fracture: Secondary | ICD-10-CM | POA: Diagnosis present

## 2019-10-31 DIAGNOSIS — I480 Paroxysmal atrial fibrillation: Secondary | ICD-10-CM | POA: Diagnosis not present

## 2019-10-31 DIAGNOSIS — K5900 Constipation, unspecified: Secondary | ICD-10-CM | POA: Diagnosis not present

## 2019-10-31 DIAGNOSIS — R778 Other specified abnormalities of plasma proteins: Secondary | ICD-10-CM | POA: Diagnosis not present

## 2019-10-31 DIAGNOSIS — R609 Edema, unspecified: Secondary | ICD-10-CM | POA: Diagnosis not present

## 2019-10-31 DIAGNOSIS — S72402A Unspecified fracture of lower end of left femur, initial encounter for closed fracture: Secondary | ICD-10-CM | POA: Diagnosis not present

## 2019-10-31 DIAGNOSIS — E8889 Other specified metabolic disorders: Secondary | ICD-10-CM | POA: Diagnosis present

## 2019-10-31 DIAGNOSIS — R001 Bradycardia, unspecified: Secondary | ICD-10-CM | POA: Diagnosis not present

## 2019-10-31 DIAGNOSIS — E7800 Pure hypercholesterolemia, unspecified: Secondary | ICD-10-CM

## 2019-10-31 DIAGNOSIS — I440 Atrioventricular block, first degree: Secondary | ICD-10-CM | POA: Diagnosis present

## 2019-10-31 DIAGNOSIS — R079 Chest pain, unspecified: Secondary | ICD-10-CM | POA: Diagnosis not present

## 2019-10-31 DIAGNOSIS — I2693 Single subsegmental pulmonary embolism without acute cor pulmonale: Secondary | ICD-10-CM | POA: Diagnosis not present

## 2019-10-31 DIAGNOSIS — Z885 Allergy status to narcotic agent status: Secondary | ICD-10-CM | POA: Diagnosis not present

## 2019-10-31 DIAGNOSIS — I25119 Atherosclerotic heart disease of native coronary artery with unspecified angina pectoris: Secondary | ICD-10-CM | POA: Diagnosis not present

## 2019-10-31 DIAGNOSIS — I739 Peripheral vascular disease, unspecified: Secondary | ICD-10-CM | POA: Diagnosis present

## 2019-10-31 DIAGNOSIS — I110 Hypertensive heart disease with heart failure: Secondary | ICD-10-CM | POA: Diagnosis not present

## 2019-10-31 DIAGNOSIS — I452 Bifascicular block: Secondary | ICD-10-CM | POA: Diagnosis present

## 2019-10-31 DIAGNOSIS — R52 Pain, unspecified: Secondary | ICD-10-CM | POA: Diagnosis not present

## 2019-10-31 DIAGNOSIS — Z96651 Presence of right artificial knee joint: Secondary | ICD-10-CM | POA: Diagnosis present

## 2019-10-31 DIAGNOSIS — Z91013 Allergy to seafood: Secondary | ICD-10-CM | POA: Diagnosis not present

## 2019-10-31 DIAGNOSIS — Z9981 Dependence on supplemental oxygen: Secondary | ICD-10-CM

## 2019-10-31 DIAGNOSIS — J9601 Acute respiratory failure with hypoxia: Secondary | ICD-10-CM | POA: Diagnosis not present

## 2019-10-31 DIAGNOSIS — R531 Weakness: Secondary | ICD-10-CM | POA: Diagnosis not present

## 2019-10-31 DIAGNOSIS — M80052A Age-related osteoporosis with current pathological fracture, left femur, initial encounter for fracture: Principal | ICD-10-CM | POA: Diagnosis present

## 2019-10-31 DIAGNOSIS — W010XXA Fall on same level from slipping, tripping and stumbling without subsequent striking against object, initial encounter: Secondary | ICD-10-CM | POA: Diagnosis present

## 2019-10-31 DIAGNOSIS — T148XXA Other injury of unspecified body region, initial encounter: Secondary | ICD-10-CM

## 2019-10-31 DIAGNOSIS — E876 Hypokalemia: Secondary | ICD-10-CM | POA: Diagnosis not present

## 2019-10-31 DIAGNOSIS — S72409A Unspecified fracture of lower end of unspecified femur, initial encounter for closed fracture: Secondary | ICD-10-CM

## 2019-10-31 DIAGNOSIS — Z841 Family history of disorders of kidney and ureter: Secondary | ICD-10-CM

## 2019-10-31 DIAGNOSIS — Z86718 Personal history of other venous thrombosis and embolism: Secondary | ICD-10-CM

## 2019-10-31 DIAGNOSIS — I248 Other forms of acute ischemic heart disease: Secondary | ICD-10-CM | POA: Diagnosis not present

## 2019-10-31 DIAGNOSIS — S79929A Unspecified injury of unspecified thigh, initial encounter: Secondary | ICD-10-CM | POA: Diagnosis not present

## 2019-10-31 DIAGNOSIS — I214 Non-ST elevation (NSTEMI) myocardial infarction: Secondary | ICD-10-CM

## 2019-10-31 DIAGNOSIS — E785 Hyperlipidemia, unspecified: Secondary | ICD-10-CM | POA: Diagnosis present

## 2019-10-31 DIAGNOSIS — M1990 Unspecified osteoarthritis, unspecified site: Secondary | ICD-10-CM | POA: Diagnosis present

## 2019-10-31 DIAGNOSIS — M6281 Muscle weakness (generalized): Secondary | ICD-10-CM | POA: Diagnosis not present

## 2019-10-31 DIAGNOSIS — Z85820 Personal history of malignant melanoma of skin: Secondary | ICD-10-CM | POA: Diagnosis not present

## 2019-10-31 DIAGNOSIS — M810 Age-related osteoporosis without current pathological fracture: Secondary | ICD-10-CM | POA: Diagnosis not present

## 2019-10-31 DIAGNOSIS — I872 Venous insufficiency (chronic) (peripheral): Secondary | ICD-10-CM | POA: Diagnosis not present

## 2019-10-31 DIAGNOSIS — I5033 Acute on chronic diastolic (congestive) heart failure: Secondary | ICD-10-CM | POA: Diagnosis not present

## 2019-10-31 DIAGNOSIS — M2550 Pain in unspecified joint: Secondary | ICD-10-CM | POA: Diagnosis not present

## 2019-10-31 DIAGNOSIS — W19XXXA Unspecified fall, initial encounter: Secondary | ICD-10-CM

## 2019-10-31 DIAGNOSIS — R32 Unspecified urinary incontinence: Secondary | ICD-10-CM | POA: Diagnosis not present

## 2019-10-31 DIAGNOSIS — S8992XA Unspecified injury of left lower leg, initial encounter: Secondary | ICD-10-CM | POA: Diagnosis not present

## 2019-10-31 DIAGNOSIS — Z86711 Personal history of pulmonary embolism: Secondary | ICD-10-CM | POA: Diagnosis not present

## 2019-10-31 DIAGNOSIS — F329 Major depressive disorder, single episode, unspecified: Secondary | ICD-10-CM | POA: Diagnosis present

## 2019-10-31 DIAGNOSIS — R339 Retention of urine, unspecified: Secondary | ICD-10-CM | POA: Diagnosis present

## 2019-10-31 DIAGNOSIS — R9431 Abnormal electrocardiogram [ECG] [EKG]: Secondary | ICD-10-CM | POA: Diagnosis present

## 2019-10-31 DIAGNOSIS — I10 Essential (primary) hypertension: Secondary | ICD-10-CM | POA: Diagnosis present

## 2019-10-31 DIAGNOSIS — S728X2A Other fracture of left femur, initial encounter for closed fracture: Secondary | ICD-10-CM | POA: Diagnosis present

## 2019-10-31 DIAGNOSIS — S72402D Unspecified fracture of lower end of left femur, subsequent encounter for closed fracture with routine healing: Secondary | ICD-10-CM | POA: Diagnosis not present

## 2019-10-31 DIAGNOSIS — R54 Age-related physical debility: Secondary | ICD-10-CM | POA: Diagnosis present

## 2019-10-31 DIAGNOSIS — I712 Thoracic aortic aneurysm, without rupture: Secondary | ICD-10-CM | POA: Diagnosis present

## 2019-10-31 DIAGNOSIS — E46 Unspecified protein-calorie malnutrition: Secondary | ICD-10-CM | POA: Diagnosis not present

## 2019-10-31 DIAGNOSIS — R9439 Abnormal result of other cardiovascular function study: Secondary | ICD-10-CM | POA: Diagnosis not present

## 2019-10-31 DIAGNOSIS — Z853 Personal history of malignant neoplasm of breast: Secondary | ICD-10-CM

## 2019-10-31 DIAGNOSIS — S72452A Displaced supracondylar fracture without intracondylar extension of lower end of left femur, initial encounter for closed fracture: Secondary | ICD-10-CM | POA: Diagnosis present

## 2019-10-31 DIAGNOSIS — R34 Anuria and oliguria: Secondary | ICD-10-CM | POA: Diagnosis not present

## 2019-10-31 DIAGNOSIS — D62 Acute posthemorrhagic anemia: Secondary | ICD-10-CM | POA: Diagnosis not present

## 2019-10-31 DIAGNOSIS — S0990XA Unspecified injury of head, initial encounter: Secondary | ICD-10-CM | POA: Diagnosis not present

## 2019-10-31 DIAGNOSIS — I5189 Other ill-defined heart diseases: Secondary | ICD-10-CM | POA: Diagnosis not present

## 2019-10-31 DIAGNOSIS — R21 Rash and other nonspecific skin eruption: Secondary | ICD-10-CM | POA: Diagnosis present

## 2019-10-31 DIAGNOSIS — I9581 Postprocedural hypotension: Secondary | ICD-10-CM | POA: Diagnosis not present

## 2019-10-31 DIAGNOSIS — I2699 Other pulmonary embolism without acute cor pulmonale: Secondary | ICD-10-CM | POA: Diagnosis not present

## 2019-10-31 DIAGNOSIS — I498 Other specified cardiac arrhythmias: Secondary | ICD-10-CM | POA: Diagnosis not present

## 2019-10-31 DIAGNOSIS — E861 Hypovolemia: Secondary | ICD-10-CM | POA: Diagnosis present

## 2019-10-31 DIAGNOSIS — I255 Ischemic cardiomyopathy: Secondary | ICD-10-CM | POA: Diagnosis not present

## 2019-10-31 DIAGNOSIS — S299XXA Unspecified injury of thorax, initial encounter: Secondary | ICD-10-CM | POA: Diagnosis not present

## 2019-10-31 DIAGNOSIS — Z79899 Other long term (current) drug therapy: Secondary | ICD-10-CM

## 2019-10-31 DIAGNOSIS — I5032 Chronic diastolic (congestive) heart failure: Secondary | ICD-10-CM | POA: Diagnosis not present

## 2019-10-31 DIAGNOSIS — Z7983 Long term (current) use of bisphosphonates: Secondary | ICD-10-CM

## 2019-10-31 DIAGNOSIS — S199XXA Unspecified injury of neck, initial encounter: Secondary | ICD-10-CM | POA: Diagnosis not present

## 2019-10-31 LAB — CBC
HCT: 37.4 % (ref 36.0–46.0)
Hemoglobin: 12.4 g/dL (ref 12.0–15.0)
MCH: 31.6 pg (ref 26.0–34.0)
MCHC: 33.2 g/dL (ref 30.0–36.0)
MCV: 95.4 fL (ref 80.0–100.0)
Platelets: 237 10*3/uL (ref 150–400)
RBC: 3.92 MIL/uL (ref 3.87–5.11)
RDW: 13 % (ref 11.5–15.5)
WBC: 11.9 10*3/uL — ABNORMAL HIGH (ref 4.0–10.5)
nRBC: 0 % (ref 0.0–0.2)

## 2019-10-31 MED ORDER — FENTANYL CITRATE (PF) 100 MCG/2ML IJ SOLN
50.0000 ug | INTRAMUSCULAR | Status: AC | PRN
  Administered 2019-10-31 – 2019-11-01 (×2): 50 ug via INTRAVENOUS
  Filled 2019-10-31: qty 2

## 2019-10-31 MED ORDER — FENTANYL CITRATE (PF) 100 MCG/2ML IJ SOLN
50.0000 ug | Freq: Once | INTRAMUSCULAR | Status: AC
  Administered 2019-10-31: 50 ug via INTRAVENOUS
  Filled 2019-10-31: qty 2

## 2019-10-31 NOTE — ED Triage Notes (Addendum)
86 year old female from home, c/o of knee problems, and chronic pain.  Pt verbalizes she was shopping and felt like she needed to sit down, "states she has chronic knee pain and does not have any cartilage in her knee". After sitting down she reports no one was around to help her for hours. She "states this was not a fall". Alert x 4.

## 2019-10-31 NOTE — ED Triage Notes (Signed)
Pt BIB EMS from home. Pt was putting away groceries and tripped and fell injuring her left knee. Pt lowered herself to the ground and was on the ground for 6 hours. Denies LOC and hitting her head.

## 2019-10-31 NOTE — ED Provider Notes (Signed)
[facility] DEPT Provider Note   CSN: [id number] Arrival date & time: 10/31/19  2055     History Chief Complaint  Patient presents with  . Fall    [name] is a 86 y.o. female with a pertinent past medical history of OA, HTN, multiple fractures, and hypercholestremia that presents to the ER for mechanical fall. She was brought by EMS from her home. EMS note states that she was putting away groceries and fell and injured her left knee and was on the ground for for 6 hours. EMS note makes it sound that she fell due to weakness. When speaking to the patient she states that she fell due to tripping on a rug while putting away groceries. She states that the only thing that is bothering her is her left knee. She also states that she has some nausea and has vomited once (nonbillious, nonbloody). She denies hitting her head or LOC. She denies any abdominal pain and states that she has had normal bowel movements today. She is not on any blood thinners. She does take a daily extra strength tylenol for her OA. She denies any pain eslwhere, no HA, no dizziness, no vision change. She denies any parasethsias or weakness.   HPI     Past Medical History:  Diagnosis Date  . Arthritis    "in my back"  . Breast cancer ([place]) 1996   left breast cancer   . Complication of anesthesia 04/18/2012   "didn't tolerate it today very well; had the shakes and very hard time w/it"  . Depression 11/20/2012  . DVT (deep venous thrombosis) ([place])   . High cholesterol   . History of alcoholism ([place])    7 1/2 years clean  . Hypertension   . Melanoma ([place]) 01/15/2004   upper right arm  . Osteoporosis   . Peripheral vascular disease ([facility])    hx of ligation   . Personal history of radiation therapy 1993   Left Breast Cancer  . SCC (squamous cell carcinoma) 01/15/2004   left post upper arm, Right elbow, post left knee  . SCC (squamous cell carcinoma) 05/21/2013   right  forearm, right jawline, left cheek  . SCC (squamous cell carcinoma) 08/24/2016   right shin,right thigh  . SCC (squamous cell carcinoma) 11/01/2016  . SCC (squamous cell carcinoma) 02/02/2018   left outer arm,right outer thigh  . SCC (squamous cell carcinoma) 12/13/2018   right jawline  . Spinal stenosis     Patient Active Problem List   Diagnosis Date Noted  . NSVT (nonsustained ventricular tachycardia) ([place]) 09/26/2018  . Bradycardia   . Displaced supracondylar fracture of distal end of right femur without intracondylar extension ([place]) 05/04/2018  . Postop check   . Pain   . Femur fracture, right ([place]) 05/02/2018  . Prolonged QT interval 05/02/2018  . Bigeminy 05/02/2018  . Left ventricular diastolic dysfunction with preserved systolic function [id number]  . PAT (paroxysmal atrial tachycardia) ([place]) 03/24/2016  . Diastolic dysfunction without heart failure 03/24/2016  . Peripheral venous insufficiency 03/24/2016  . Closed wedge compression fracture of fourth lumbar vertebra ([place])   . Near syncope 10/20/2014  . Orthostatic hypotension 10/20/2014  . Fatigue 10/19/2014  . Syncope 10/19/2014  . Generalized weakness   . Elevated troponin   . Dyspnea on exertion   . Rib fracture 09/05/2014  . HX: breast cancer 09/26/2013  . Depression 11/20/2012  . Unspecified constipation 11/20/2012  . Hypercholesterolemia 11/20/2012  .  Essential hypertension, benign 11/20/2012  . Postoperative anemia due to acute blood loss 11/01/2012  . Hyponatremia 11/01/2012  . Hypokalemia 11/01/2012  . OA (osteoarthritis) of knee 10/29/2012  . Septic joint of right knee joint ([place]) 05/23/2012  . Elevated liver enzymes 05/23/2012    Past Surgical History:  Procedure Laterality Date  . BREAST LUMPECTOMY Left 1996  . I & D EXTREMITY  04/17/2012   Procedure: IRRIGATION AND DEBRIDEMENT EXTREMITY;  Surgeon: Rudean Haskell, MD;  Location: [facility];  Service: Orthopedics;  Laterality: Right;  . KNEE  ARTHROSCOPY  04/17/2012   Procedure: ARTHROSCOPY KNEE;  Surgeon: Rudean Haskell, MD;  Location: [facility];  Service: Orthopedics;  Laterality: Right;  . KYPHOPLASTY  2009, 2013   thorasic, lumbar  . MENISECTOMY Right 04/11/2012  . ORIF PERIPROSTHETIC FRACTURE Right 05/03/2018   Procedure: RIGHT RETROGRADE FEMORAL NAIL;  Surgeon: Rod Can, MD;  Location[facility];  Service: Orthopedics;  Laterality: Right;  . POSTERIOR LAMINECTOMY / DECOMPRESSION LUMBAR SPINE  1980's  . rotator cuff surgery  Right   . TONSILLECTOMY AND ADENOIDECTOMY     "I was a child"  . TOTAL KNEE ARTHROPLASTY Right 10/29/2012   Procedure: RIGHT TOTAL KNEE ARTHROPLASTY;  Surgeon: Gearlean Alf, MD;  [facility];  Service: Orthopedics;  Laterality: Right;  . VEIN LIGATION       OB History   No obstetric history on file.     Family History  Problem Relation Age of Onset  . Kidney failure Mother   . Anuerysm Father        AAA    Social History   Tobacco Use  . Smoking status: Never Smoker  . Smokeless tobacco: Never Used  Substance Use Topics  . Alcohol use: No  . Drug use: No    Home Medications Prior to Admission medications   Medication Sig Start Date End Date Taking? Authorizing Provider  acetaminophen (TYLENOL) 500 MG tablet Take 1,000 mg by mouth daily as needed (back pain).    [provider]  alendronate (FOSAMAX) 70 MG tablet Take 70 mg by mouth once a week. Every Sunday. 08/21/13   [provider]  Calcium Carbonate-Vitamin D (CALCIUM-D) 600-400 MG-UNIT TABS Take 1 tablet by mouth daily.    [provider]  docusate sodium (COLACE) 100 MG capsule Take 1 capsule (100 mg total) by mouth 2 (two) times daily. 05/08/18   Georgette Shell, MD  escitalopram (LEXAPRO) 10 MG tablet Take 20 mg by mouth daily.     [provider]  Multiple Vitamins-Minerals (MULTIVITAMIN ADULT) TABS Take 1 tablet by mouth daily.  08/17/09   [provider]  potassium  chloride (KLOR-CON) 10 MEQ tablet TAKE 1 TABLET BY MOUTH EVERY DAY 07/11/19   Croitoru, Mihai, MD  simvastatin (ZOCOR) 20 MG tablet Take 1 tablet (20 mg total) by mouth daily. 05/21/18   Almyra Deforest, PA  tamsulosin (FLOMAX) 0.4 MG CAPS capsule Take 0.4 mg by mouth daily.    [provider]  triamterene-hydrochlorothiazide (MAXZIDE) 75-50 MG tablet Take 1 tablet by mouth daily. TAKE 1 TABLET BY MOUTH DAILY. 08/01/19   Croitoru, Mihai, MD    Allergies    Oysters [shellfish allergy] and Codeine  Review of Systems   Review of Systems  Constitutional: Negative for appetite change, chills, diaphoresis and fatigue.  HENT: Negative for ear discharge, facial swelling, nosebleeds and trouble swallowing.   Eyes: Negative for pain and visual disturbance.  Respiratory: Negative for chest tightness,  shortness of breath and wheezing.   Cardiovascular: Negative for chest pain.  Gastrointestinal: Positive for nausea and vomiting. Negative for abdominal pain, constipation and diarrhea.  Genitourinary: Negative.   Musculoskeletal:       Left knee pain  Neurological: Negative for dizziness, tremors, weakness, numbness and headaches.    Physical Exam Updated Vital Signs BP 115/72 (BP Location: Left Arm)   Pulse 87   Temp 98.4 F (36.9 C) (Oral)   Resp (!) 23   SpO2 100%   Physical Exam PE: Constitutional: well-developed, well-nourished, no apparent distress laying comfortably in bed  HENT: normocephalic, atraumatic. No battle sign, no racoon sign  Cardiovascular: normal rate and rhythm, distal pulses intact Pulmonary/Chest: effort normal; breath sounds clear and equal bilaterally; no wheezes or rales, no tenderness over chest wall  Abdominal: NBS, soft and nontender anywhere, no rebounding, no guarding  Musculoskeletal: tenderness noted on left knee. No ecchymosis or fluid noted on left knee, difficult to manipulate due to pain. Difficult to manipulate left hip due to knee pain. Left ankle  with full strength. Sensation intact on left foot. PT and DP 2+ on left side. No cervical, thoracic, lumbar, or sacral pain.  Neurological: alert and oriented, PERRLA, EOMS normal, normal finger to nose, strength intact on right extremity and left upper extremity. Left lower extremity unable to access due to pain - strength is at least 3/5.  Skin: warm and dry, no diaphoresis Psychiatric: normal mood and affect, normal behavior   ED Results / Procedures / Treatments   Labs (all labs ordered are listed, but only abnormal results are displayed) Labs Reviewed - No data to display  EKG None  Radiology No results found.  Procedures Procedures (including critical care time)  Medications Ordered in ED Medications - No data to display  ED Course  I have reviewed the triage vital signs and the nursing notes.  Pertinent labs & imaging results that were available during my care of the patient were reviewed by me and considered in my medical decision making (see chart for details).  Clinical Course as of Nov 01 46  Thu Oct 31, 2019  2249 DG Knee Complete 4 Views Left [SP]  Fri Nov 01, 2019  0046 CK Total(!): 272 [SP]  [id number] WBC(!): 11.9 [SP]    Clinical Course User Index [SP] Alfredia Client, PA-C   MDM Rules/Calculators/A&P                     Jabriah Ledet is a 86 y.o. female with a pertinent past medical history of OA, HTN, multiple fractures, and hypercholestremia that presents to the ER for mechanical fall.  EMS and patient's story were conflicting and due to patient's vomiting and mechanical fall therefore I will order head CT and cervical spine CT to rule out bleed.  Left knee x-ray and left hip x-ray also ordered.  She is neurovascularly intact distally.  Basic labs and EKG ordered.Pain is being controlled with Fentanyl.  No acute abnormality on CT Head or Cervical Spine.  Knee x-ray shows displaced and angulated oblique fracture of the distal left femur without  dislocation. Her othropedist is Dr. Lazarus Gowda. Dr. Regenia Skeeter spoke to Dr. Lyla Glassing who recommended knee immobolizer, hospital admission and admission to [facility]. Surgery will be on 4/9. CK is 272, will give fluids. Patient to be admitted.  Patient agreeable with plan. Patient was also assessed by Gwenlyn Perking Muthersbaugh and Dr. Regenia Skeeter agree with plan.    Final  Clinical Impression(s) / ED Diagnoses Final diagnoses:  None    Rx / DC Orders ED Discharge Orders    None       Alfredia Client, PA-C 11/01/19 NP:[id number]    Sherwood Gambler, MD 11/04/19 [phone]

## 2019-10-31 NOTE — ED Notes (Signed)
Pt in xray and c-t 

## 2019-11-01 ENCOUNTER — Inpatient Hospital Stay (HOSPITAL_COMMUNITY): Payer: Medicare Other

## 2019-11-01 ENCOUNTER — Encounter (HOSPITAL_COMMUNITY)
Admission: EM | Disposition: A | Discharge status: Skilled Nursing Facility | Payer: Self-pay | Source: Home / Self Care | Attending: Internal Medicine

## 2019-11-01 ENCOUNTER — Encounter (HOSPITAL_COMMUNITY): Payer: Self-pay | Admitting: Family Medicine

## 2019-11-01 ENCOUNTER — Inpatient Hospital Stay (HOSPITAL_COMMUNITY): Payer: Medicare Other | Admitting: Certified Registered"

## 2019-11-01 ENCOUNTER — Other Ambulatory Visit: Payer: Self-pay

## 2019-11-01 DIAGNOSIS — R9431 Abnormal electrocardiogram [ECG] [EKG]: Secondary | ICD-10-CM

## 2019-11-01 DIAGNOSIS — F329 Major depressive disorder, single episode, unspecified: Secondary | ICD-10-CM | POA: Diagnosis present

## 2019-11-01 DIAGNOSIS — I2699 Other pulmonary embolism without acute cor pulmonale: Secondary | ICD-10-CM | POA: Diagnosis not present

## 2019-11-01 DIAGNOSIS — I5189 Other ill-defined heart diseases: Secondary | ICD-10-CM

## 2019-11-01 DIAGNOSIS — R7989 Other specified abnormal findings of blood chemistry: Secondary | ICD-10-CM | POA: Diagnosis present

## 2019-11-01 DIAGNOSIS — S728X2A Other fracture of left femur, initial encounter for closed fracture: Secondary | ICD-10-CM | POA: Diagnosis present

## 2019-11-01 DIAGNOSIS — I9581 Postprocedural hypotension: Secondary | ICD-10-CM

## 2019-11-01 DIAGNOSIS — J9601 Acute respiratory failure with hypoxia: Secondary | ICD-10-CM | POA: Diagnosis not present

## 2019-11-01 DIAGNOSIS — I248 Other forms of acute ischemic heart disease: Secondary | ICD-10-CM | POA: Diagnosis not present

## 2019-11-01 DIAGNOSIS — S72452A Displaced supracondylar fracture without intracondylar extension of lower end of left femur, initial encounter for closed fracture: Secondary | ICD-10-CM | POA: Diagnosis present

## 2019-11-01 DIAGNOSIS — I2693 Single subsegmental pulmonary embolism without acute cor pulmonale: Secondary | ICD-10-CM | POA: Diagnosis present

## 2019-11-01 DIAGNOSIS — I739 Peripheral vascular disease, unspecified: Secondary | ICD-10-CM | POA: Diagnosis present

## 2019-11-01 DIAGNOSIS — M1990 Unspecified osteoarthritis, unspecified site: Secondary | ICD-10-CM | POA: Diagnosis present

## 2019-11-01 DIAGNOSIS — M80052A Age-related osteoporosis with current pathological fracture, left femur, initial encounter for fracture: Secondary | ICD-10-CM | POA: Diagnosis present

## 2019-11-01 DIAGNOSIS — S72402A Unspecified fracture of lower end of left femur, initial encounter for closed fracture: Secondary | ICD-10-CM | POA: Diagnosis present

## 2019-11-01 DIAGNOSIS — R079 Chest pain, unspecified: Secondary | ICD-10-CM | POA: Diagnosis not present

## 2019-11-01 DIAGNOSIS — R9439 Abnormal result of other cardiovascular function study: Secondary | ICD-10-CM | POA: Diagnosis not present

## 2019-11-01 DIAGNOSIS — R339 Retention of urine, unspecified: Secondary | ICD-10-CM | POA: Diagnosis present

## 2019-11-01 DIAGNOSIS — Z841 Family history of disorders of kidney and ureter: Secondary | ICD-10-CM | POA: Diagnosis not present

## 2019-11-01 DIAGNOSIS — I429 Cardiomyopathy, unspecified: Secondary | ICD-10-CM | POA: Diagnosis not present

## 2019-11-01 DIAGNOSIS — I25119 Atherosclerotic heart disease of native coronary artery with unspecified angina pectoris: Secondary | ICD-10-CM | POA: Diagnosis not present

## 2019-11-01 DIAGNOSIS — E876 Hypokalemia: Secondary | ICD-10-CM | POA: Diagnosis present

## 2019-11-01 DIAGNOSIS — R338 Other retention of urine: Secondary | ICD-10-CM

## 2019-11-01 DIAGNOSIS — R778 Other specified abnormalities of plasma proteins: Secondary | ICD-10-CM | POA: Diagnosis not present

## 2019-11-01 DIAGNOSIS — I255 Ischemic cardiomyopathy: Secondary | ICD-10-CM | POA: Diagnosis not present

## 2019-11-01 DIAGNOSIS — Z853 Personal history of malignant neoplasm of breast: Secondary | ICD-10-CM | POA: Diagnosis not present

## 2019-11-01 DIAGNOSIS — W010XXA Fall on same level from slipping, tripping and stumbling without subsequent striking against object, initial encounter: Secondary | ICD-10-CM | POA: Diagnosis present

## 2019-11-01 DIAGNOSIS — I214 Non-ST elevation (NSTEMI) myocardial infarction: Secondary | ICD-10-CM | POA: Diagnosis not present

## 2019-11-01 DIAGNOSIS — Y92019 Unspecified place in single-family (private) house as the place of occurrence of the external cause: Secondary | ICD-10-CM | POA: Diagnosis not present

## 2019-11-01 DIAGNOSIS — S72009A Fracture of unspecified part of neck of unspecified femur, initial encounter for closed fracture: Secondary | ICD-10-CM | POA: Diagnosis present

## 2019-11-01 DIAGNOSIS — Z20822 Contact with and (suspected) exposure to covid-19: Secondary | ICD-10-CM | POA: Diagnosis present

## 2019-11-01 DIAGNOSIS — I452 Bifascicular block: Secondary | ICD-10-CM | POA: Diagnosis present

## 2019-11-01 DIAGNOSIS — E7800 Pure hypercholesterolemia, unspecified: Secondary | ICD-10-CM | POA: Diagnosis not present

## 2019-11-01 DIAGNOSIS — Z885 Allergy status to narcotic agent status: Secondary | ICD-10-CM | POA: Diagnosis not present

## 2019-11-01 DIAGNOSIS — I110 Hypertensive heart disease with heart failure: Secondary | ICD-10-CM | POA: Diagnosis present

## 2019-11-01 DIAGNOSIS — Z91013 Allergy to seafood: Secondary | ICD-10-CM | POA: Diagnosis not present

## 2019-11-01 DIAGNOSIS — E785 Hyperlipidemia, unspecified: Secondary | ICD-10-CM | POA: Diagnosis present

## 2019-11-01 DIAGNOSIS — D62 Acute posthemorrhagic anemia: Secondary | ICD-10-CM | POA: Diagnosis not present

## 2019-11-01 DIAGNOSIS — I5033 Acute on chronic diastolic (congestive) heart failure: Secondary | ICD-10-CM | POA: Diagnosis not present

## 2019-11-01 DIAGNOSIS — Z85820 Personal history of malignant melanoma of skin: Secondary | ICD-10-CM | POA: Diagnosis not present

## 2019-11-01 DIAGNOSIS — I10 Essential (primary) hypertension: Secondary | ICD-10-CM | POA: Diagnosis not present

## 2019-11-01 DIAGNOSIS — I472 Ventricular tachycardia: Secondary | ICD-10-CM | POA: Diagnosis present

## 2019-11-01 HISTORY — PX: ORIF FEMUR FRACTURE: SHX2119

## 2019-11-01 LAB — CBC
HCT: 33.7 % — ABNORMAL LOW (ref 36.0–46.0)
HCT: 36.6 % (ref 36.0–46.0)
Hemoglobin: 11 g/dL — ABNORMAL LOW (ref 12.0–15.0)
Hemoglobin: 11.7 g/dL — ABNORMAL LOW (ref 12.0–15.0)
MCH: 31.1 pg (ref 26.0–34.0)
MCH: 31 pg (ref 26.0–34.0)
MCHC: 32.6 g/dL (ref 30.0–36.0)
MCHC: 32 g/dL (ref 30.0–36.0)
MCV: 95.2 fL (ref 80.0–100.0)
MCV: 96.8 fL (ref 80.0–100.0)
Platelets: 215 10*3/uL (ref 150–400)
Platelets: 190 10*3/uL (ref 150–400)
RBC: 3.54 MIL/uL — ABNORMAL LOW (ref 3.87–5.11)
RBC: 3.78 MIL/uL — ABNORMAL LOW (ref 3.87–5.11)
RDW: 12.9 % (ref 11.5–15.5)
RDW: 13.2 % (ref 11.5–15.5)
WBC: 9.6 10*3/uL (ref 4.0–10.5)
WBC: 11.9 10*3/uL — ABNORMAL HIGH (ref 4.0–10.5)
nRBC: 0 % (ref 0.0–0.2)
nRBC: 0 % (ref 0.0–0.2)

## 2019-11-01 LAB — COMPREHENSIVE METABOLIC PANEL
ALT: 28 U/L (ref 0–44)
AST: 35 U/L (ref 15–41)
Albumin: 3.8 g/dL (ref 3.5–5.0)
Alkaline Phosphatase: 64 U/L (ref 38–126)
Anion gap: 12 (ref 5–15)
BUN: 21 mg/dL (ref 8–23)
CO2: 27 mmol/L (ref 22–32)
Calcium: 9.5 mg/dL (ref 8.9–10.3)
Chloride: 101 mmol/L (ref 98–111)
Creatinine, Ser: 0.59 mg/dL (ref 0.44–1.00)
GFR calc Af Amer: >60 mL/min (ref >60)
GFR calc non Af Amer: >60 mL/min (ref >60)
Glucose, Bld: 138 mg/dL — ABNORMAL HIGH (ref 70–99)
Potassium: 3.2 mmol/L — ABNORMAL LOW (ref 3.5–5.1)
Sodium: 140 mmol/L (ref 135–145)
Total Bilirubin: 0.8 mg/dL (ref 0.3–1.2)
Total Protein: 7.1 g/dL (ref 6.5–8.1)

## 2019-11-01 LAB — TROPONIN I (HIGH SENSITIVITY)
Troponin I (High Sensitivity): 3180 ng/L (ref <18)
Troponin I (High Sensitivity): 3672 ng/L (ref <18)

## 2019-11-01 LAB — BASIC METABOLIC PANEL
Anion gap: 12 (ref 5–15)
BUN: 18 mg/dL (ref 8–23)
CO2: 26 mmol/L (ref 22–32)
Calcium: 8.2 mg/dL — ABNORMAL LOW (ref 8.9–10.3)
Chloride: 103 mmol/L (ref 98–111)
Creatinine, Ser: 0.51 mg/dL (ref 0.44–1.00)
GFR calc Af Amer: >60 mL/min (ref >60)
GFR calc non Af Amer: >60 mL/min (ref >60)
Glucose, Bld: 114 mg/dL — ABNORMAL HIGH (ref 70–99)
Potassium: 3 mmol/L — ABNORMAL LOW (ref 3.5–5.1)
Sodium: 141 mmol/L (ref 135–145)

## 2019-11-01 LAB — TYPE AND SCREEN
ABO/RH(D): A POS
ABO/RH(D): A POS
Antibody Screen: NEGATIVE
Antibody Screen: NEGATIVE

## 2019-11-01 LAB — SARS CORONAVIRUS 2 (TAT 6-24 HRS): SARS Coronavirus 2: NEGATIVE

## 2019-11-01 LAB — MAGNESIUM: Magnesium: 1.3 mg/dL — ABNORMAL LOW (ref 1.7–2.4)

## 2019-11-01 LAB — CREATININE, SERUM
Creatinine, Ser: 0.72 mg/dL (ref 0.44–1.00)
GFR calc Af Amer: >60 mL/min (ref >60)
GFR calc non Af Amer: >60 mL/min (ref >60)

## 2019-11-01 LAB — CK: Total CK: 272 U/L — ABNORMAL HIGH (ref 38–234)

## 2019-11-01 SURGERY — OPEN REDUCTION INTERNAL FIXATION (ORIF) DISTAL FEMUR FRACTURE
Anesthesia: General | Laterality: Left

## 2019-11-01 MED ORDER — PROPOFOL 10 MG/ML IV BOLUS
INTRAVENOUS | Status: AC
  Filled 2019-11-01: qty 20

## 2019-11-01 MED ORDER — ENOXAPARIN SODIUM 40 MG/0.4ML ~~LOC~~ SOLN: 40.0000 mg | SUBCUTANEOUS | Status: DC

## 2019-11-01 MED ORDER — CHLORHEXIDINE GLUCONATE CLOTH 2 % EX PADS
6.0000 | MEDICATED_PAD | Freq: Every day | CUTANEOUS | Status: DC
  Administered 2019-11-01 – 2019-11-07 (×6): 6 via TOPICAL

## 2019-11-01 MED ORDER — CEFAZOLIN SODIUM-DEXTROSE 2-4 GM/100ML-% IV SOLN
2.0000 g | INTRAVENOUS | Status: AC
  Administered 2019-11-01: 2 g via INTRAVENOUS

## 2019-11-01 MED ORDER — LACTATED RINGERS IV SOLN: INTRAVENOUS | Status: DC | PRN

## 2019-11-01 MED ORDER — POTASSIUM CHLORIDE 10 MEQ/100ML IV SOLN
INTRAVENOUS | Status: AC
  Administered 2019-11-01: 10 meq via INTRAVENOUS
  Filled 2019-11-01: qty 100

## 2019-11-01 MED ORDER — LIDOCAINE 2% (20 MG/ML) 5 ML SYRINGE
INTRAMUSCULAR | Status: AC
  Filled 2019-11-01: qty 5

## 2019-11-01 MED ORDER — METOCLOPRAMIDE HCL 5 MG PO TABS
5.0000 mg | ORAL_TABLET | Freq: Three times a day (TID) | ORAL | Status: DC | PRN
  Filled 2019-11-01: qty 2

## 2019-11-01 MED ORDER — ROCURONIUM BROMIDE 10 MG/ML (PF) SYRINGE
PREFILLED_SYRINGE | INTRAVENOUS | Status: AC
  Filled 2019-11-01: qty 10

## 2019-11-01 MED ORDER — CHLORHEXIDINE GLUCONATE 4 % EX LIQD: 60.0000 mL | Freq: Once | CUTANEOUS | Status: DC

## 2019-11-01 MED ORDER — ONDANSETRON HCL 4 MG/2ML IJ SOLN
INTRAMUSCULAR | Status: DC | PRN
  Administered 2019-11-01: 4 mg via INTRAVENOUS

## 2019-11-01 MED ORDER — DOCUSATE SODIUM 100 MG PO CAPS
100.0000 mg | ORAL_CAPSULE | Freq: Two times a day (BID) | ORAL | Status: DC | PRN
  Administered 2019-11-03: 10:00:00 100 mg via ORAL
  Filled 2019-11-01: qty 1

## 2019-11-01 MED ORDER — ORAL CARE MOUTH RINSE
15.0000 mL | Freq: Two times a day (BID) | OROMUCOSAL | Status: DC
  Administered 2019-11-01 – 2019-11-08 (×13): 15 mL via OROMUCOSAL

## 2019-11-01 MED ORDER — SUCCINYLCHOLINE CHLORIDE 200 MG/10ML IV SOSY
PREFILLED_SYRINGE | INTRAVENOUS | Status: DC | PRN
  Administered 2019-11-01: 100 mg via INTRAVENOUS

## 2019-11-01 MED ORDER — SIMVASTATIN 20 MG PO TABS
20.0000 mg | ORAL_TABLET | Freq: Every day | ORAL | Status: DC
  Administered 2019-11-02: 20 mg via ORAL
  Filled 2019-11-01 (×2): qty 1

## 2019-11-01 MED ORDER — POTASSIUM CHLORIDE CRYS ER 20 MEQ PO TBCR
20.0000 meq | EXTENDED_RELEASE_TABLET | Freq: Once | ORAL | Status: AC
  Administered 2019-11-01: 20 meq via ORAL
  Filled 2019-11-01: qty 1

## 2019-11-01 MED ORDER — ALBUMIN HUMAN 5 % IV SOLN
12.5000 g | Freq: Once | INTRAVENOUS | Status: AC
  Administered 2019-11-01: 12.5 g via INTRAVENOUS

## 2019-11-01 MED ORDER — HYDROCODONE-ACETAMINOPHEN 5-325 MG PO TABS
1.0000 | ORAL_TABLET | ORAL | Status: DC | PRN
  Administered 2019-11-02: 1 via ORAL
  Administered 2019-11-03: 2 via ORAL
  Administered 2019-11-04 – 2019-11-08 (×2): 1 via ORAL
  Filled 2019-11-01: qty 2
  Filled 2019-11-01: qty 1
  Filled 2019-11-01: qty 2
  Filled 2019-11-01: qty 1

## 2019-11-01 MED ORDER — MORPHINE SULFATE (PF) 2 MG/ML IV SOLN: 0.5000 mg | INTRAVENOUS | Status: DC | PRN

## 2019-11-01 MED ORDER — SUGAMMADEX SODIUM 200 MG/2ML IV SOLN
INTRAVENOUS | Status: DC | PRN
  Administered 2019-11-01: 130 mg via INTRAVENOUS

## 2019-11-01 MED ORDER — FENTANYL CITRATE (PF) 100 MCG/2ML IJ SOLN: 25.0000 ug | INTRAMUSCULAR | Status: DC | PRN

## 2019-11-01 MED ORDER — PHENYLEPHRINE 40 MCG/ML (10ML) SYRINGE FOR IV PUSH (FOR BLOOD PRESSURE SUPPORT)
PREFILLED_SYRINGE | INTRAVENOUS | Status: DC | PRN
  Administered 2019-11-01 (×2): 80 ug via INTRAVENOUS

## 2019-11-01 MED ORDER — ESMOLOL HCL 100 MG/10ML IV SOLN
INTRAVENOUS | Status: DC | PRN
  Administered 2019-11-01: 20 mg via INTRAVENOUS

## 2019-11-01 MED ORDER — DEXAMETHASONE SODIUM PHOSPHATE 10 MG/ML IJ SOLN
INTRAMUSCULAR | Status: DC | PRN
  Administered 2019-11-01: 5 mg via INTRAVENOUS

## 2019-11-01 MED ORDER — POTASSIUM CHLORIDE IN NACL 40-0.9 MEQ/L-% IV SOLN
INTRAVENOUS | Status: DC
  Administered 2019-11-01: 75 mL/h via INTRAVENOUS
  Filled 2019-11-01: qty 1000

## 2019-11-01 MED ORDER — ALBUMIN HUMAN 5 % IV SOLN: INTRAVENOUS | Status: DC | PRN

## 2019-11-01 MED ORDER — PHENYLEPHRINE 40 MCG/ML (10ML) SYRINGE FOR IV PUSH (FOR BLOOD PRESSURE SUPPORT)
PREFILLED_SYRINGE | INTRAVENOUS | Status: AC
  Filled 2019-11-01: qty 10

## 2019-11-01 MED ORDER — SODIUM CHLORIDE 0.9 % IV SOLN
250.0000 mL | INTRAVENOUS | Status: DC
  Administered 2019-11-01: 250 mL via INTRAVENOUS

## 2019-11-01 MED ORDER — ACETAMINOPHEN 500 MG PO TABS
500.0000 mg | ORAL_TABLET | Freq: Three times a day (TID) | ORAL | Status: DC
  Administered 2019-11-01 – 2019-11-03 (×5): 500 mg via ORAL
  Filled 2019-11-01 (×5): qty 1

## 2019-11-01 MED ORDER — ACETAMINOPHEN 325 MG PO TABS
325.0000 mg | ORAL_TABLET | Freq: Four times a day (QID) | ORAL | Status: DC | PRN
  Administered 2019-11-03: 650 mg via ORAL
  Administered 2019-11-05: 325 mg via ORAL
  Administered 2019-11-06 (×2): 650 mg via ORAL
  Filled 2019-11-01 (×5): qty 2

## 2019-11-01 MED ORDER — 0.9 % SODIUM CHLORIDE (POUR BTL) OPTIME
TOPICAL | Status: DC | PRN
  Administered 2019-11-01: 1000 mL

## 2019-11-01 MED ORDER — PHENYLEPHRINE HCL-NACL 10-0.9 MG/250ML-% IV SOLN
INTRAVENOUS | Status: DC | PRN
  Administered 2019-11-01: 25 ug/min via INTRAVENOUS

## 2019-11-01 MED ORDER — PROPOFOL 10 MG/ML IV BOLUS
INTRAVENOUS | Status: DC | PRN
  Administered 2019-11-01: 20 mg via INTRAVENOUS
  Administered 2019-11-01: 110 mg via INTRAVENOUS

## 2019-11-01 MED ORDER — METOCLOPRAMIDE HCL 5 MG/ML IJ SOLN: 5.0000 mg | Freq: Three times a day (TID) | INTRAMUSCULAR | Status: DC | PRN

## 2019-11-01 MED ORDER — SODIUM CHLORIDE 0.9 % IV BOLUS
250.0000 mL | Freq: Once | INTRAVENOUS | Status: AC
  Administered 2019-11-01: 250 mL via INTRAVENOUS

## 2019-11-01 MED ORDER — POLYETHYLENE GLYCOL 3350 17 G PO PACK
17.0000 g | PACK | Freq: Every day | ORAL | Status: DC | PRN
  Filled 2019-11-01: qty 1

## 2019-11-01 MED ORDER — DOCUSATE SODIUM 100 MG PO CAPS
100.0000 mg | ORAL_CAPSULE | Freq: Two times a day (BID) | ORAL | Status: DC
  Administered 2019-11-02 (×2): 100 mg via ORAL
  Filled 2019-11-01 (×3): qty 1

## 2019-11-01 MED ORDER — POTASSIUM CHLORIDE IN NACL 40-0.9 MEQ/L-% IV SOLN: INTRAVENOUS | Status: DC

## 2019-11-01 MED ORDER — POTASSIUM CHLORIDE 10 MEQ/100ML IV SOLN
10.0000 meq | INTRAVENOUS | Status: AC
  Administered 2019-11-01: 10 meq via INTRAVENOUS
  Filled 2019-11-01: qty 100

## 2019-11-01 MED ORDER — MAGNESIUM SULFATE 50 % IJ SOLN
3.0000 g | Freq: Once | INTRAVENOUS | Status: AC
  Administered 2019-11-01: 3 g via INTRAVENOUS
  Filled 2019-11-01: qty 6

## 2019-11-01 MED ORDER — CEFAZOLIN SODIUM-DEXTROSE 1-4 GM/50ML-% IV SOLN
1.0000 g | Freq: Four times a day (QID) | INTRAVENOUS | Status: AC
  Administered 2019-11-02 (×3): 1 g via INTRAVENOUS
  Filled 2019-11-01 (×5): qty 50

## 2019-11-01 MED ORDER — NOREPINEPHRINE 4 MG/250ML-% IV SOLN
2.0000 ug/min | INTRAVENOUS | Status: DC
  Administered 2019-11-01: 2 ug/min via INTRAVENOUS
  Filled 2019-11-01 (×2): qty 250

## 2019-11-01 MED ORDER — ONDANSETRON HCL 4 MG/2ML IJ SOLN
INTRAMUSCULAR | Status: AC
  Filled 2019-11-01: qty 2

## 2019-11-01 MED ORDER — POVIDONE-IODINE 10 % EX SWAB: 2.0000 "application " | Freq: Once | CUTANEOUS | Status: DC

## 2019-11-01 MED ORDER — FENTANYL CITRATE (PF) 100 MCG/2ML IJ SOLN
25.0000 ug | INTRAMUSCULAR | Status: DC | PRN
  Administered 2019-11-01: 50 ug via INTRAVENOUS
  Administered 2019-11-01: 25 ug via INTRAVENOUS
  Filled 2019-11-01 (×2): qty 2

## 2019-11-01 MED ORDER — LACTATED RINGERS IV BOLUS
1000.0000 mL | Freq: Once | INTRAVENOUS | Status: AC
  Administered 2019-11-01: 1000 mL via INTRAVENOUS

## 2019-11-01 MED ORDER — FENTANYL CITRATE (PF) 100 MCG/2ML IJ SOLN
75.0000 ug | Freq: Once | INTRAMUSCULAR | Status: AC
  Administered 2019-11-01: 75 ug via INTRAVENOUS
  Filled 2019-11-01: qty 2

## 2019-11-01 MED ORDER — FENTANYL CITRATE (PF) 250 MCG/5ML IJ SOLN
INTRAMUSCULAR | Status: AC
  Filled 2019-11-01: qty 5

## 2019-11-01 MED ORDER — FENTANYL CITRATE (PF) 100 MCG/2ML IJ SOLN
INTRAMUSCULAR | Status: DC | PRN
  Administered 2019-11-01 (×2): 25 ug via INTRAVENOUS
  Administered 2019-11-01 (×2): 50 ug via INTRAVENOUS
  Administered 2019-11-01: 25 ug via INTRAVENOUS

## 2019-11-01 MED ORDER — EPHEDRINE SULFATE-NACL 50-0.9 MG/10ML-% IV SOSY
PREFILLED_SYRINGE | INTRAVENOUS | Status: DC | PRN
  Administered 2019-11-01 (×3): 5 mg via INTRAVENOUS

## 2019-11-01 MED ORDER — PHENYLEPHRINE HCL-NACL 10-0.9 MG/250ML-% IV SOLN
0.0000 ug/min | INTRAVENOUS | Status: DC
  Administered 2019-11-01: 30 ug/min via INTRAVENOUS

## 2019-11-01 MED ORDER — ROCURONIUM BROMIDE 50 MG/5ML IV SOSY
PREFILLED_SYRINGE | INTRAVENOUS | Status: DC | PRN
  Administered 2019-11-01: 10 mg via INTRAVENOUS
  Administered 2019-11-01: 50 mg via INTRAVENOUS

## 2019-11-01 SURGICAL SUPPLY — 78 items
BIT DRILL 4.3 (BIT) ×2
BIT DRILL 4.3X300MM (BIT) ×1 IMPLANT
BIT DRILL LONG 3.3 (BIT) ×4 IMPLANT
BIT DRILL QC 3.3X195 (BIT) ×2 IMPLANT
BLADE CLIPPER SURG (BLADE) IMPLANT
BNDG ELASTIC 4X5.8 VLCR STR LF (GAUZE/BANDAGES/DRESSINGS) ×2 IMPLANT
BNDG ELASTIC 6X5.8 VLCR STR LF (GAUZE/BANDAGES/DRESSINGS) ×2 IMPLANT
BNDG GAUZE ELAST 4 BULKY (GAUZE/BANDAGES/DRESSINGS) ×2 IMPLANT
BRUSH SCRUB EZ PLAIN DRY (MISCELLANEOUS) ×4 IMPLANT
CANISTER SUCT 3000ML PPV (MISCELLANEOUS) ×2 IMPLANT
CAP LOCK NCB (Cap) ×12 IMPLANT
COVER SURGICAL LIGHT HANDLE (MISCELLANEOUS) ×2 IMPLANT
COVER WAND RF STERILE (DRAPES) ×2 IMPLANT
DRAPE C-ARM 42X72 X-RAY (DRAPES) ×2 IMPLANT
DRAPE C-ARMOR (DRAPES) ×2 IMPLANT
DRAPE IMP U-DRAPE 54X76 (DRAPES) ×2 IMPLANT
DRAPE ORTHO SPLIT 77X108 STRL (DRAPES) ×6
DRAPE SURG ORHT 6 SPLT 77X108 (DRAPES) ×3 IMPLANT
DRAPE U-SHAPE 47X51 STRL (DRAPES) ×2 IMPLANT
DRSG ADAPTIC 3X8 NADH LF (GAUZE/BANDAGES/DRESSINGS) ×2 IMPLANT
DRSG MEPILEX BORDER 4X8 (GAUZE/BANDAGES/DRESSINGS) ×4 IMPLANT
DRSG PAD ABDOMINAL 8X10 ST (GAUZE/BANDAGES/DRESSINGS) ×8 IMPLANT
ELECT REM PT RETURN 9FT ADLT (ELECTROSURGICAL) ×2
ELECTRODE REM PT RTRN 9FT ADLT (ELECTROSURGICAL) ×1 IMPLANT
EVACUATOR 1/8 PVC DRAIN (DRAIN) IMPLANT
EVACUATOR 3/16  PVC DRAIN (DRAIN)
EVACUATOR 3/16 PVC DRAIN (DRAIN) IMPLANT
GAUZE SPONGE 4X4 12PLY STRL (GAUZE/BANDAGES/DRESSINGS) ×2 IMPLANT
GLOVE BIO SURGEON STRL SZ7.5 (GLOVE) ×2 IMPLANT
GLOVE BIO SURGEON STRL SZ8 (GLOVE) ×2 IMPLANT
GLOVE BIOGEL PI IND STRL 7.5 (GLOVE) ×1 IMPLANT
GLOVE BIOGEL PI IND STRL 8 (GLOVE) ×1 IMPLANT
GLOVE BIOGEL PI INDICATOR 7.5 (GLOVE) ×1
GLOVE BIOGEL PI INDICATOR 8 (GLOVE) ×1
GOWN STRL REUS W/ TWL LRG LVL3 (GOWN DISPOSABLE) ×2 IMPLANT
GOWN STRL REUS W/ TWL XL LVL3 (GOWN DISPOSABLE) ×1 IMPLANT
GOWN STRL REUS W/TWL LRG LVL3 (GOWN DISPOSABLE) ×4
GOWN STRL REUS W/TWL XL LVL3 (GOWN DISPOSABLE) ×2
K-WIRE 2.0 (WIRE) ×2
K-WIRE FXSTD 280X2XNS SS (WIRE) ×1
KIT BASIN OR (CUSTOM PROCEDURE TRAY) ×2 IMPLANT
KIT TURNOVER KIT B (KITS) ×2 IMPLANT
KWIRE FXSTD 280X2XNS SS (WIRE) ×1 IMPLANT
NEEDLE 22X1 1/2 (OR ONLY) (NEEDLE) IMPLANT
NS IRRIG 1000ML POUR BTL (IV SOLUTION) ×2 IMPLANT
PACK TOTAL JOINT (CUSTOM PROCEDURE TRAY) ×2 IMPLANT
PACK UNIVERSAL I (CUSTOM PROCEDURE TRAY) ×2 IMPLANT
PAD ARMBOARD 7.5X6 YLW CONV (MISCELLANEOUS) ×4 IMPLANT
PAD CAST 4YDX4 CTTN HI CHSV (CAST SUPPLIES) ×1 IMPLANT
PADDING CAST COTTON 4X4 STRL (CAST SUPPLIES) ×2
PADDING CAST COTTON 6X4 STRL (CAST SUPPLIES) ×2 IMPLANT
PLATE DIST FEM 12H (Plate) ×2 IMPLANT
SCREW 5.0 70MM (Screw) ×2 IMPLANT
SCREW 5.0 80MM (Screw) ×6 IMPLANT
SCREW NCB 4.0 32MM (Screw) ×2 IMPLANT
SCREW NCB 4.0MX34M (Screw) ×6 IMPLANT
SCREW NCB 4.0MX55M (Screw) ×2 IMPLANT
SCREW NCB 4.0X36MM (Screw) ×2 IMPLANT
SCREW NCB 5.0X34MM (Screw) ×2 IMPLANT
SCREW NCB 5.0X85MM (Screw) ×4 IMPLANT
SPONGE LAP 18X18 RF (DISPOSABLE) ×2 IMPLANT
STAPLER VISISTAT 35W (STAPLE) ×2 IMPLANT
SUCTION FRAZIER HANDLE 10FR (MISCELLANEOUS) ×2
SUCTION TUBE FRAZIER 10FR DISP (MISCELLANEOUS) ×1 IMPLANT
SUT ETHILON 2 0 PSLX (SUTURE) ×2 IMPLANT
SUT PROLENE 0 CT 2 (SUTURE) IMPLANT
SUT VIC AB 0 CT1 27 (SUTURE) ×6
SUT VIC AB 0 CT1 27XBRD ANBCTR (SUTURE) ×3 IMPLANT
SUT VIC AB 1 CT1 27 (SUTURE) ×6
SUT VIC AB 1 CT1 27XBRD ANBCTR (SUTURE) ×3 IMPLANT
SUT VIC AB 2-0 CT1 27 (SUTURE) ×6
SUT VIC AB 2-0 CT1 TAPERPNT 27 (SUTURE) ×3 IMPLANT
SYR 20ML ECCENTRIC (SYRINGE) IMPLANT
TOWEL GREEN STERILE (TOWEL DISPOSABLE) ×4 IMPLANT
TOWEL GREEN STERILE FF (TOWEL DISPOSABLE) ×2 IMPLANT
TRAY FOLEY MTR SLVR 16FR STAT (SET/KITS/TRAYS/PACK) IMPLANT
WATER STERILE IRR 1000ML POUR (IV SOLUTION) ×4 IMPLANT
YANKAUER SUCT BULB TIP NO VENT (SUCTIONS) ×2 IMPLANT

## 2019-11-01 NOTE — ED Notes (Addendum)
#  16FR Indwelling foley placed without difficulty a/o. Draining clear yellow, dark urine. Pt sts much relief noted. Emptied 927ml from foley at this time. Pt may be staying at [facility] and is aware. Cont to monitor.

## 2019-11-01 NOTE — Progress Notes (Signed)
[place] Progress Note Patient Name: [name] DOB: February 14, 1932 MRN: [id number]   Date of Service  11/01/2019  HPI/Events of Note  Pt admitted to the ICU post-op left hip ORIF of a fracture, due to hypotension requiring pressor therapy, hemoglobin is 11.7 gm.  eICU Interventions  New Patient Evaluation completed.         Frederik Pear 11/01/2019, 9:34 PM

## 2019-11-01 NOTE — ED Notes (Signed)
No urine output noted with purewick. Dr. Alfredia Ferguson notified.

## 2019-11-01 NOTE — Consult Note (Signed)
NAME:  [name], MRN:  [id number], DOB:  [birth year], LOS: 0 ADMISSION DATE:  10/31/2019, CONSULTATION DATE:  11/01/19 REFERRING MD:  Marcelino Scot, CHIEF COMPLAINT:  Hip pain   Brief History   86 y.o. F with fall from standing at home suffering L hip fx, went for ORIF today and post-op was hypotensive requiring pressors, so PCCM consulted for admission.  History of present illness   86 y.o. F with PMH of OA<  L breast Ca, HTN, DVT, Depression, squamous cell carcinoma and melanoma who ambulates with a cane and walker at baseline and sustained a mechanical fall at home when walker caught on a rug.   She lowered herself to the ground sustaining injury to the L knee and was on the ground for 6hrs.   In the ED, head CT was negative, found to have distal L femur fracture. She did not have signs of acute infection and was hemodynamically stable.  Pt was admitted to the hospitalist service and taken to the OR on 4/9 for ORIF.  Post-op, pt was hypotensive and was started on neosynephrine.  EBL 150cc and received approximately 2L IVF.    On admission, pt is awake and alert and denies any significant discomfort.   Past Medical History   has a past medical history of Arthritis, Breast cancer ([place]) (99991111), Complication of anesthesia (04/18/2012), Depression (11/20/2012), DVT (deep venous thrombosis) ([place]), High cholesterol, History of alcoholism ([place]), Hypertension, Melanoma ([place]) (01/15/2004), Osteoporosis, Peripheral vascular disease ([place]), Personal history of radiation therapy (1993), SCC (squamous cell carcinoma) (01/15/2004), SCC (squamous cell carcinoma) (05/21/2013), SCC (squamous cell carcinoma) (08/24/2016), SCC (squamous cell carcinoma) (11/01/2016), SCC (squamous cell carcinoma) (02/02/2018), SCC (squamous cell carcinoma) (12/13/2018), and Spinal stenosis.   Significant Hospital Events   4/8 Admit to hospitalist service 4/9 ORIF, post-op hypotension and PCCM consult  Consults:   Ortho PCCM  Procedures:  4/9 ORIF  Significant Diagnostic Tests:  4/8 CT head>>atrophy, no acute findings 4/8>>no acute findings 4/8 Knee xray>>Displaced and angulated oblique fracture of the distal left femur. No dislocation.  Micro Data:  4/8 Covid-19>>negative  Antimicrobials:  Ancef 4/9-  Interim history/subjective:  Pt transferred to the ICU, remains awake and pleasantly confused  Objective   Blood pressure (!) 81/57, pulse (!) 128, temperature (!) 97.3 F (36.3 C), resp. rate (!) 27, height 5\' 6"  (1.676 m), weight 63.5 kg, SpO2 95 %.        Intake/Output Summary (Last 24 hours) at 11/01/2019 2143 Last data filed at 11/01/2019 1718 Gross per 24 hour  Intake 650 ml  Output 1530 ml  Net -880 ml   Filed Weights   11/01/19 0904  Weight: 63.5 kg    General:  Elderly, thin female in no acute distress HEENT: MM pink/moist Neuro: awake, oriented to person, confused to situation CV: s1s2 rrr, no m/r/g PULM:  CTAB GI: soft, mildly distended, non-tender, bsx4 active  Extremities: warm/dry, mild LLE, surgical site bandaged and dry, distal sensation and motor intact  Skin: mild rash to the face (present prior to admission per patient), pink maculopapular plaques   Resolved Hospital Problem list     Assessment & Plan:    Hypotension in the setting of L Femur fracture s/p ORIF  -Hgb stable -received albumin and ~2L IVF peri-operatively  P: -peripheral Levophed to maintain MAP of >60 -Give additional 1 L LR, per notes pt was on the floor for 6hrs and may still need volume, CK 272, repeat in the morning -Repeat h/h overnight  and again in the AM, transfuse as needed -VBG pending  -pain control with fentanyl   Elevated troponin -Initial  3,180 -no signs of ischemia on EKG P: -trend trop, check Echo (last in 2019 with grade 1 diastolic dysfunction and EF 60-65%)    Hypokalemia/hypomagnesemia -received 3g IV mag and K in IVF P: -follow and replete as  needed    HTN -hold home Maxzide   History of Depression -Holding Lexapro       Best practice:  Diet: NPO Pain/Anxiety/Delirium protocol (if indicated): Fentanyl VAP protocol (if indicated): n/a DVT prophylaxis: SCD's GI prophylaxis: n/a Glucose control: ssi Mobility: bed rest Code Status: full code Family Communication: Pt's husband updated by phone Disposition: ICU  Labs   CBC: Recent Labs  Lab 10/31/19 2235 11/01/19 0430 11/01/19 1914  WBC 11.9* 9.6 11.9*  HGB 12.4 11.0* 11.7*  HCT 37.4 33.7* 36.6  MCV 95.4 95.2 96.8  PLT 237 215 [id number]    Basic Metabolic Panel: Recent Labs  Lab 10/31/19 2235 11/01/19 0430 11/01/19 1914  NA 140 141  --   K 3.2* 3.0*  --   CL 101 103  --   CO2 27 26  --   GLUCOSE 138* 114*  --   BUN 21 18  --   CREATININE 0.59 0.51 0.72  CALCIUM 9.5 8.2*  --   MG  --  1.3*  --    GFR: Estimated Creatinine Clearance: 48.1 mL/min (by C-G formula based on SCr of 0.72 mg/dL). Recent Labs  Lab 10/31/19 2235 11/01/19 0430 11/01/19 1914  WBC 11.9* 9.6 11.9*    Liver Function Tests: Recent Labs  Lab 10/31/19 2235  AST 35  ALT 28  ALKPHOS 64  BILITOT 0.8  PROT 7.1  ALBUMIN 3.8   No results for input(s): LIPASE, AMYLASE in the last 168 hours. No results for input(s): AMMONIA in the last 168 hours.  ABG No results found for: PHART, PCO2ART, PO2ART, HCO3, TCO2, ACIDBASEDEF, O2SAT   Coagulation Profile: No results for input(s): INR, PROTIME in the last 168 hours.  Cardiac Enzymes: Recent Labs  Lab 10/31/19 2235  CKTOTAL 272*    HbA1C: No results found for: HGBA1C  CBG: No results for input(s): GLUCAP in the last 168 hours.  Review of Systems:   Negative except as noted in HPI  Past Medical History  She,  has a past medical history of Arthritis, Breast cancer ([place]) (99991111), Complication of anesthesia (04/18/2012), Depression (11/20/2012), DVT (deep venous thrombosis) ([place]), High cholesterol, History of alcoholism  ([place]), Hypertension, Melanoma ([place]) (01/15/2004), Osteoporosis, Peripheral vascular disease ([place]), Personal history of radiation therapy (1993), SCC (squamous cell carcinoma) (01/15/2004), SCC (squamous cell carcinoma) (05/21/2013), SCC (squamous cell carcinoma) (08/24/2016), SCC (squamous cell carcinoma) (11/01/2016), SCC (squamous cell carcinoma) (02/02/2018), SCC (squamous cell carcinoma) (12/13/2018), and Spinal stenosis.   Surgical History    Past Surgical History:  Procedure Laterality Date  . BREAST LUMPECTOMY Left 1996  . I & D EXTREMITY  04/17/2012   Procedure: IRRIGATION AND DEBRIDEMENT EXTREMITY;  Surgeon: Rudean Haskell, MD;  Location: [place];  Service: Orthopedics;  Laterality: Right;  . KNEE ARTHROSCOPY  04/17/2012   Procedure: ARTHROSCOPY KNEE;  Surgeon: Rudean Haskell, MD;  Location: [place];  Service: Orthopedics;  Laterality: Right;  . KYPHOPLASTY  2009, 2013   thorasic, lumbar  . MENISECTOMY Right 04/11/2012  . ORIF PERIPROSTHETIC FRACTURE Right 05/03/2018   Procedure: RIGHT RETROGRADE FEMORAL NAIL;  Surgeon: Rod Can, MD;  Location: [facility];  Service: Orthopedics;  Laterality: Right;  . POSTERIOR LAMINECTOMY / DECOMPRESSION LUMBAR SPINE  1980's  . rotator cuff surgery  Right   . TONSILLECTOMY AND ADENOIDECTOMY     "I was a child"  . TOTAL KNEE ARTHROPLASTY Right 10/29/2012   Procedure: RIGHT TOTAL KNEE ARTHROPLASTY;  Surgeon: Gearlean Alf, MD;  Location: [facility];  Service: Orthopedics;  Laterality: Right;  . VEIN LIGATION       Social History   reports that she has never smoked. She has never used smokeless tobacco. She reports that she does not drink alcohol or use drugs.   Family History   Her family history includes Anuerysm in her father; Kidney failure in her mother.   Allergies Allergies  Allergen Reactions  . Oysters [Shellfish Allergy] Nausea And Vomiting  . Codeine Nausea And Vomiting     Home Medications  Prior to Admission medications    Medication Sig Start Date End Date Taking? Authorizing Provider  acetaminophen (TYLENOL) 500 MG tablet Take 1,000 mg by mouth daily as needed (back pain).   Yes [provider]  Calcium Carbonate-Vitamin D (CALCIUM-D) 600-400 MG-UNIT TABS Take 1 tablet by mouth daily.   Yes [provider]  escitalopram (LEXAPRO) 20 MG tablet Take 20 mg by mouth daily. 10/08/19  Yes [provider]  Multiple Vitamins-Minerals (MULTIVITAMIN ADULT) TABS Take 1 tablet by mouth daily.  08/17/09  Yes [provider]  potassium chloride (KLOR-CON) 10 MEQ tablet TAKE 1 TABLET BY MOUTH EVERY DAY 07/11/19  Yes Croitoru, Mihai, MD  simvastatin (ZOCOR) 20 MG tablet Take 1 tablet (20 mg total) by mouth daily. 05/21/18  Yes Meng, Isaac Laud, PA  triamterene-hydrochlorothiazide (MAXZIDE) 75-50 MG tablet Take 1 tablet by mouth daily. TAKE 1 TABLET BY MOUTH DAILY. Patient taking differently: Take 0.5 tablets by mouth daily. TAKE 0.5 TABLET BY MOUTH DAILY. 08/01/19  Yes Croitoru, Mihai, MD  docusate sodium (COLACE) 100 MG capsule Take 1 capsule (100 mg total) by mouth 2 (two) times daily. Patient not taking: Reported on 10/31/2019 05/08/18   Georgette Shell, MD     Critical care time: 52 minutes      CRITICAL CARE Performed by: Otilio Carpen Gad Aymond   Total critical care time: 52 minutes  Critical care time was exclusive of separately billable procedures and treating other patients.  Critical care was necessary to treat or prevent imminent or life-threatening deterioration.  Critical care was time spent personally by me on the following activities: development of treatment plan with patient and/or surrogate as well as nursing, discussions with consultants, evaluation of patient's response to treatment, examination of patient, obtaining history from patient or surrogate, ordering and performing treatments and interventions, ordering and review of laboratory studies, ordering and review of  radiographic studies, pulse oximetry and re-evaluation of patient's condition.   Otilio Carpen Teyla Skidgel, PA-C

## 2019-11-01 NOTE — Progress Notes (Signed)
Consult received. Xrays reviewed - complex LEFT distal femur fracture. TRH consulted for admission. [facility].

## 2019-11-01 NOTE — ED Notes (Signed)
Pt c/o increased pain to left leg. Med a/o. Purewick applied for UO. VSS. Awaits room availability at [facility]. Pt aware. A/Ox3 at this time. Cont to monitor.

## 2019-11-01 NOTE — ED Notes (Signed)
[facility] arrives to transport pt to [facility].

## 2019-11-01 NOTE — Anesthesia Procedure Notes (Signed)
Procedure Name: Intubation Date/Time: 11/01/2019 2:55 PM Performed by: Jearld Pies, CRNA Pre-anesthesia Checklist: Patient identified, Emergency Drugs available, Suction available and Patient being monitored Patient Re-evaluated:Patient Re-evaluated prior to induction Oxygen Delivery Method: Circle System Utilized Preoxygenation: Pre-oxygenation with 100% oxygen Induction Type: IV induction, Rapid sequence and Cricoid Pressure applied Laryngoscope Size: Mac and 3 Grade View: Grade I Tube type: Oral Tube size: 7.0 mm Number of attempts: 1 Airway Equipment and Method: Stylet Placement Confirmation: ETT inserted through vocal cords under direct vision,  positive ETCO2 and breath sounds checked- equal and bilateral Secured at: 23 cm Tube secured with: Tape Dental Injury: Teeth and Oropharynx as per pre-operative assessment

## 2019-11-01 NOTE — ED Notes (Signed)
Multiple attempts at calling Ortho Tech with busy signal each time.

## 2019-11-01 NOTE — Anesthesia Preprocedure Evaluation (Addendum)
Anesthesia Evaluation  Patient identified by MRN, date of birth, ID band Patient awake    Reviewed: Allergy & Precautions, NPO status , Patient's Chart, lab work & pertinent test results  History of Anesthesia Complications Negative for: history of anesthetic complications  Airway Mallampati: II  TM Distance: >3 FB Neck ROM: Full    Dental  (+) Dental Advisory Given, Caps   Pulmonary neg pulmonary ROS,  10/31/2019 SARS coronavirus NEG   breath sounds clear to auscultation       Cardiovascular hypertension, Pt. on medications (-) angina+ DVT   Rhythm:Regular Rate:Normal  '19 ECHO: EF 60-65%, mild MR   Neuro/Psych Depression negative neurological ROS     GI/Hepatic Neg liver ROS, nausea   Endo/Other  negative endocrine ROS  Renal/GU negative Renal ROS     Musculoskeletal  (+) Arthritis ,   Abdominal   Peds  Hematology negative hematology ROS (+)   Anesthesia Other Findings   Reproductive/Obstetrics                            Anesthesia Physical Anesthesia Plan  ASA: III  Anesthesia Plan: General   Post-op Pain Management:    Induction: Intravenous  PONV Risk Score and Plan: 3 and Ondansetron, Dexamethasone and Treatment may vary due to age or medical condition  Airway Management Planned: Oral ETT  Additional Equipment:   Intra-op Plan:   Post-operative Plan: Extubation in OR  Informed Consent: I have reviewed the patients History and Physical, chart, labs and discussed the procedure including the risks, benefits and alternatives for the proposed anesthesia with the patient or authorized representative who has indicated his/her understanding and acceptance.     Dental advisory given  Plan Discussed with: CRNA and Surgeon  Anesthesia Plan Comments:        Anesthesia Quick Evaluation

## 2019-11-01 NOTE — Progress Notes (Signed)
Care started prior to midnight in the emergency room yesterday and she is admitted after midnight by my partner and colleague Dr. Christia Reading Opyd and I am in current agreement with his assessment and plan.  Additional changes to the plan of care been made accordingly.  The patient is an 86 year old medical history significant for but not limited to left breast cancer, depression, remote alcoholism, chronic diastolic CHF, osteoporosis and osteoarthritis as well as other comorbidities who presents to the hospital with severe left pain after a fall with inability to bear weight.  Patient reports that she had been out grocery shopping which is negative grossly back inside of her house and she believes that she tripped on the rug in the kitchen causing her to pivot awkwardly and fall to the ground with severe left pain.  She denies any loss of consciousness and reports that she had a uneventful day in her normal state of health.  She presented to the emergency room and had a noncontrast head CT was negative for intracranial abnormalities and cervical spine CT was negative for any bony acute abnormality but was notable for chronic fracture compression disc herniation as well as degenerative changes.  She is typed and screened in the ED and given 250 normal saline bolus and multiple doses of fentanyl for her pain.  Further work-up revealed that she had a distal femur fracture and orthopedic surgery was consulted and recommending transferring to [facility] for trauma Ortho to see this patient.  Awaiting a bed and transferred to [facility].  Hospitalization is appropriate and complicated by acute urinary tension so Foley catheter was placed.  She will be transferred to [facility] for further orthopedic surgical intervention and please refer to Dr. Vianne Bulls note for further delineated plan of care.

## 2019-11-01 NOTE — ED Provider Notes (Signed)
Patient with distal femur fracture.  Pain better controlled with IV fentanyl.  Dr. Lyla Glassing recommends knee immobilizer, hospitalist admission, and admission to [facility].  Will have surgery on 4/9 so keep NPO.   Sherwood Gambler, MD 11/01/19 [id number]

## 2019-11-01 NOTE — ED Notes (Signed)
Ortho Tech paged awaiting call back

## 2019-11-01 NOTE — Consult Note (Signed)
Orthopaedic Trauma Service (OTS) Consult   Patient ID: [name] MRN: [id number] DOB/AGE: 1932/04/26 86 y.o.   Reason for Consult: left supracondylar femur fracture Referring Physician: Raiford Noble, MD   HPI: [name] is an 86 y.o. female who ambulates with cane at home and walker in the community and who fell bringing groceries into the house when rolling walker caught on a rug. She reports pain and swelling since injury, unable to get up, no associated numbness or tingling. Denies other injury.   Past Medical History:  Diagnosis Date  . Arthritis    "in my back"  . Breast cancer ([place]) 1996   left breast cancer   . Complication of anesthesia 04/18/2012   "didn't tolerate it today very well; had the shakes and very hard time w/it"  . Depression 11/20/2012  . DVT (deep venous thrombosis) ([place])   . High cholesterol   . History of alcoholism ([place])    7 1/2 years clean  . Hypertension   . Melanoma ([place]) 01/15/2004   upper right arm  . Osteoporosis   . Peripheral vascular disease ([facility])    hx of ligation   . Personal history of radiation therapy 1993   Left Breast Cancer  . SCC (squamous cell carcinoma) 01/15/2004   left post upper arm, Right elbow, post left knee  . SCC (squamous cell carcinoma) 05/21/2013   right forearm, right jawline, left cheek  . SCC (squamous cell carcinoma) 08/24/2016   right shin,right thigh  . SCC (squamous cell carcinoma) 11/01/2016  . SCC (squamous cell carcinoma) 02/02/2018   left outer arm,right outer thigh  . SCC (squamous cell carcinoma) 12/13/2018   right jawline  . Spinal stenosis     Past Surgical History:  Procedure Laterality Date  . BREAST LUMPECTOMY Left 1996  . I & D EXTREMITY  04/17/2012   Procedure: IRRIGATION AND DEBRIDEMENT EXTREMITY;  Surgeon: Rudean Haskell, MD;  Location: [facility];  Service: Orthopedics;  Laterality: Right;  . KNEE ARTHROSCOPY  04/17/2012   Procedure: ARTHROSCOPY KNEE;   Surgeon: Rudean Haskell, MD;  Location: [facility];  Service: Orthopedics;  Laterality: Right;  . KYPHOPLASTY  2009, 2013   thorasic, lumbar  . MENISECTOMY Right 04/11/2012  . ORIF PERIPROSTHETIC FRACTURE Right 05/03/2018   Procedure: RIGHT RETROGRADE FEMORAL NAIL;  Surgeon: Rod Can, MD;  Location: [facility];  Service: Orthopedics;  Laterality: Right;  . POSTERIOR LAMINECTOMY / DECOMPRESSION LUMBAR SPINE  1980's  . rotator cuff surgery  Right   . TONSILLECTOMY AND ADENOIDECTOMY     "I was a child"  . TOTAL KNEE ARTHROPLASTY Right 10/29/2012   Procedure: RIGHT TOTAL KNEE ARTHROPLASTY;  Surgeon: Gearlean Alf, MD;  Location: [facility];  Service: Orthopedics;  Laterality: Right;  . VEIN LIGATION      Family History  Problem Relation Age of Onset  . Kidney failure Mother   . Anuerysm Father        AAA    Social History:  reports that she has never smoked. She has never used smokeless tobacco. She reports that she does not drink alcohol or use drugs.  Allergies:  Allergies  Allergen Reactions  . Oysters [Shellfish Allergy] Nausea And Vomiting  . Codeine Nausea And Vomiting    Medications:  Prior to Admission:  Medications Prior to Admission  Medication Sig Dispense Refill Last Dose  . acetaminophen (TYLENOL) 500 MG tablet Take 1,000 mg by mouth daily as  needed (back pain).   10/31/2019 at Unknown time  . alendronate (FOSAMAX) 70 MG tablet Take 70 mg by mouth once a week. Every Sunday.   10/27/2019 at unknown time  . Calcium Carbonate-Vitamin D (CALCIUM-D) 600-400 MG-UNIT TABS Take 1 tablet by mouth daily.   10/31/2019 at Unknown time  . escitalopram (LEXAPRO) 20 MG tablet Take 20 mg by mouth daily.   10/31/2019 at Unknown time  . Multiple Vitamins-Minerals (MULTIVITAMIN ADULT) TABS Take 1 tablet by mouth daily.    10/31/2019 at Unknown time  . potassium chloride (KLOR-CON) 10 MEQ tablet TAKE 1 TABLET BY MOUTH EVERY DAY 90 tablet 3 10/31/2019 at Unknown time  . simvastatin (ZOCOR) 20 MG tablet  Take 1 tablet (20 mg total) by mouth daily. 90 tablet 1 10/31/2019 at Unknown time  . triamterene-hydrochlorothiazide (MAXZIDE) 75-50 MG tablet Take 1 tablet by mouth daily. TAKE 1 TABLET BY MOUTH DAILY. (Patient taking differently: Take 0.5 tablets by mouth daily. TAKE 0.5 TABLET BY MOUTH DAILY.) 90 tablet 3 10/31/2019 at Unknown time  . docusate sodium (COLACE) 100 MG capsule Take 1 capsule (100 mg total) by mouth 2 (two) times daily. (Patient not taking: Reported on 10/31/2019) 10 capsule 0 Completed Course at Unknown time    Results for orders placed or performed during the hospital encounter of 10/31/19 (from the past 48 hour(s))  CK     Status: Abnormal   Collection Time: 10/31/19 10:35 PM  Result Value Ref Range   Total CK 272 (H) 38 - 234 U/L    Comment: Performed at [facility], [place] [address], [place], [place] [address]  CBC     Status: Abnormal   Collection Time: 10/31/19 10:35 PM  Result Value Ref Range   WBC 11.9 (H) 4.0 - 10.5 K/uL   RBC 3.92 3.87 - 5.11 MIL/uL   Hemoglobin 12.4 12.0 - 15.0 g/dL   HCT 37.4 36.0 - 46.0 %   MCV 95.4 80.0 - 100.0 fL   MCH 31.6 26.0 - 34.0 pg   MCHC 33.2 30.0 - 36.0 g/dL   RDW 13.0 11.5 - 15.5 %   Platelets 237 150 - 400 K/uL   nRBC 0.0 0.0 - 0.2 %    Comment: Performed at [facility], [place] [address] [place], [place] [address]  Comprehensive metabolic panel     Status: Abnormal   Collection Time: 10/31/19 10:35 PM  Result Value Ref Range   Sodium 140 135 - 145 mmol/L   Potassium 3.2 (L) 3.5 - 5.1 mmol/L   Chloride 101 98 - 111 mmol/L   CO2 27 22 - 32 mmol/L   Glucose, Bld 138 (H) 70 - 99 mg/dL    Comment: Glucose reference range applies only to samples taken after fasting for at least 8 hours.   BUN 21 8 - 23 mg/dL   Creatinine, Ser 0.59 0.44 - 1.00 mg/dL   Calcium 9.5 8.9 - 10.3 mg/dL   Total Protein 7.1 6.5 - 8.1 g/dL   Albumin 3.8 3.5 - 5.0 g/dL   AST 35 15 - 41 U/L   ALT 28 0 - 44 U/L    Alkaline Phosphatase 64 38 - 126 U/L   Total Bilirubin 0.8 0.3 - 1.2 mg/dL   GFR calc non Af Amer >60 >60 mL/min   GFR calc Af Amer >60 >60 mL/min   Anion gap 12 5 - 15    Comment: Performed at [facility], [place] [address]  [place], [place] [address]  SARS CORONAVIRUS 2 (TAT 6-24 HRS) Nasopharyngeal Nasopharyngeal Swab     Status: None   Collection Time: 10/31/19 11:25 PM   Specimen: Nasopharyngeal Swab  Result Value Ref Range   SARS Coronavirus 2 NEGATIVE NEGATIVE    Comment: (NOTE) SARS-CoV-2 target nucleic acids are NOT DETECTED. The SARS-CoV-2 RNA is generally detectable in upper and lower respiratory specimens during the acute phase of infection. Negative results do not preclude SARS-CoV-2 infection, do not rule out co-infections with other pathogens, and should not be used as the sole basis for treatment or other patient management decisions. Negative results must be combined with clinical observations, patient history, and epidemiological information. The expected result is Negative. Fact Sheet for Patients: SugarRoll.be Fact Sheet for Healthcare Providers: https://www.woods-mathews.com/ This test is not yet approved or cleared by the [place] FDA and  has been authorized for detection and/or diagnosis of SARS-CoV-2 by FDA under an Emergency Use Authorization (EUA). This EUA will remain  in effect (meaning this test can be used) for the duration of the COVID-19 declaration under Section 56 4(b)(1) of the Act, 21 U.S.C. section 360bbb-3(b)(1), unless the authorization is terminated or revoked sooner. Performed at [facility], [place] [address], [place], [place] [address]   Type and screen E. Lopez     Status: None   Collection Time: 10/31/19 11:38 PM  Result Value Ref Range   ABO/RH(D) A POS    Antibody Screen NEG    Sample Expiration      11/03/2019,2359 Performed at [facility], [place] [address], [place], [place] [address]   Magnesium     Status: Abnormal   Collection Time: 11/01/19  4:30 AM  Result Value Ref Range   Magnesium 1.3 (L) 1.7 - 2.4 mg/dL    Comment: Performed at [facility], [place] [address], [place], [place] [id number]  Basic metabolic panel     Status: Abnormal   Collection Time: 11/01/19  4:30 AM  Result Value Ref Range   Sodium 141 135 - 145 mmol/L   Potassium 3.0 (L) 3.5 - 5.1 mmol/L   Chloride 103 98 - 111 mmol/L   CO2 26 22 - 32 mmol/L   Glucose, Bld 114 (H) 70 - 99 mg/dL    Comment: Glucose reference range applies only to samples taken after fasting for at least 8 hours.   BUN 18 8 - 23 mg/dL   Creatinine, Ser 0.51 0.44 - 1.00 mg/dL   Calcium 8.2 (L) 8.9 - 10.3 mg/dL   GFR calc non Af Amer >60 >60 mL/min   GFR calc Af Amer >60 >60 mL/min   Anion gap 12 5 - 15    Comment: Performed at [facility], [place] [address], [place], [place] [address]  CBC     Status: Abnormal   Collection Time: 11/01/19  4:30 AM  Result Value Ref Range   WBC 9.6 4.0 - 10.5 K/uL   RBC 3.54 (L) 3.87 - 5.11 MIL/uL   Hemoglobin 11.0 (L) 12.0 - 15.0 g/dL   HCT 33.7 (L) 36.0 - 46.0 %   MCV 95.2 80.0 - 100.0 fL   MCH 31.1 26.0 - 34.0 pg   MCHC 32.6 30.0 - 36.0 g/dL   RDW 12.9 11.5 - 15.5 %   Platelets 215 150 - 400 K/uL   nRBC 0.0 0.0 - 0.2 %    Comment: Performed at [facility], [place]  [address] [place], [place] [address]    DG Chest 2 View  Result Date: 10/31/2019 CLINICAL DATA:  86 year old female with fall. Preop chest radiograph. EXAM: CHEST - 2 VIEW COMPARISON:  Chest radiograph dated 05/21/2019. FINDINGS: Diffuse bilateral chronic interstitial coarsening and bronchitic changes. No focal consolidation, pleural effusion, or pneumothorax. Stable cardiac silhouette. Atherosclerotic calcification of the aorta. Old bilateral rib fractures. No acute osseous pathology. Osteopenia with  degenerative changes of the spine and old compression fractures of the lower thoracic spine with vertebroplasty. Left axillary surgical clips. IMPRESSION: No acute cardiopulmonary process. Electronically Signed   By: Anner Crete M.D.   On: 10/31/2019 23:49   CT Head Wo Contrast  Result Date: 10/31/2019 CLINICAL DATA:  Fall EXAM: CT HEAD WITHOUT CONTRAST TECHNIQUE: Contiguous axial images were obtained from the base of the skull through the vertex without intravenous contrast. COMPARISON:  07/17/2019 FINDINGS: Brain: There is atrophy and chronic small vessel disease changes. No acute intracranial abnormality. Specifically, no hemorrhage, hydrocephalus, mass lesion, acute infarction, or significant intracranial injury. Vascular: No hyperdense vessel or unexpected calcification. Skull: No acute calvarial abnormality. Sinuses/Orbits: Visualized paranasal sinuses and mastoids clear. Orbital soft tissues unremarkable. Other: None IMPRESSION: Atrophy, chronic microvascular disease. No acute intracranial abnormality. Electronically Signed   By: Rolm Baptise M.D.   On: 10/31/2019 23:23   CT Cervical Spine Wo Contrast  Result Date: 10/31/2019 CLINICAL DATA:  Fall EXAM: CT CERVICAL SPINE WITHOUT CONTRAST TECHNIQUE: Multidetector CT imaging of the cervical spine was performed without intravenous contrast. Multiplanar CT image reconstructions were also generated. COMPARISON:  09/15/2015 FINDINGS: Alignment: No subluxation Skull base and vertebrae: Old unfused odontoid fracture again noted, similar to prior study. Progressive degenerative change at C1-2. Stable mild compression deformity through the superior endplate of C7. No acute fracture. Soft tissues and spinal canal: Central disc herniation at C3-4 is stable. No prevertebral fluid or swelling. No visible canal hematoma. Disc levels: Disc herniation at C3-4 as above. Diffuse degenerative disc disease and facet disease. Upper chest: Biapical scarring.  No acute  findings. Other: None IMPRESSION: Chronic unfused odontoid fracture with associated degenerative changes at C1-2. Degenerative changes are progressive since 2016. Stable chronic mild compression deformity of C7. Stable central disc herniation at C3-4. No acute bony abnormality. Degenerative disc and facet disease diffusely. Electronically Signed   By: Rolm Baptise M.D.   On: 10/31/2019 23:31   DG Knee Complete 4 Views Left  Result Date: 10/31/2019 CLINICAL DATA:  86 year old female with trauma to the left lower extremity. EXAM: LEFT KNEE - COMPLETE 4+ VIEW; LEFT FEMUR 2 VIEWS COMPARISON:  None. FINDINGS: There is a displaced and angulated oblique fracture of the distal left femur involving the distal femoral metadiaphysis. There is approximately 3 cm medial displacement of the distal fracture fragment and posterior angulation. There is advanced osteopenia. Severe arthritic changes of the left knee with tricompartmental narrowing most pronounced involving the lateral compartment. There is no dislocation. There is a small suprapatellar effusion. The soft tissues are unremarkable. Vascular calcifications noted. IMPRESSION: 1. Displaced and angulated oblique fracture of the distal left femur. No dislocation. 2. Osteopenia and severe osteoarthritic changes of the left knee. Electronically Signed   By: Anner Crete M.D.   On: 10/31/2019 23:28   DG FEMUR MIN 2 VIEWS LEFT  Result Date: 10/31/2019 CLINICAL DATA:  86 year old female with trauma to the left lower extremity. EXAM: LEFT KNEE - COMPLETE 4+ VIEW; LEFT FEMUR 2 VIEWS COMPARISON:  None. FINDINGS: There is a displaced and angulated oblique  fracture of the distal left femur involving the distal femoral metadiaphysis. There is approximately 3 cm medial displacement of the distal fracture fragment and posterior angulation. There is advanced osteopenia. Severe arthritic changes of the left knee with tricompartmental narrowing most pronounced involving the  lateral compartment. There is no dislocation. There is a small suprapatellar effusion. The soft tissues are unremarkable. Vascular calcifications noted. IMPRESSION: 1. Displaced and angulated oblique fracture of the distal left femur. No dislocation. 2. Osteopenia and severe osteoarthritic changes of the left knee. Electronically Signed   By: Anner Crete M.D.   On: 10/31/2019 23:28    ROS No recent fever, bleeding abnormalities, urologic dysfunction, GI problems, or weight gain.  Blood pressure 111/75, pulse 81, temperature 98.3 F (36.8 C), temperature source Oral, resp. rate (!) 21, height 5\' 6"  (1.676 m), weight 63.5 kg, SpO2 99 %. Physical Exam NCAT, somnolent but quite coherent RRR No external wheezing LLE Tender, swelling at distal femur on left  Edema/ swelling controlled  Sens: DPN, SPN, TN intact  Motor: EHL, FHL, and lessor toe ext and flex all intact grossly  Brisk cap refill, warm to touch     Gait: could not assess Coordination and balance: could not assess   Assessment/Plan:  Left distal femur fracture, long oblique  I discussed with the patient the risks and benefits of surgery, including the possibility of infection, nerve injury, vessel injury, wound breakdown, arthritis, symptomatic hardware, DVT/ PE, loss of motion, malunion, nonunion, and need for further surgery among others.  She acknowledged these risks and wished to proceed.  Weightbearing: TDWB LLE Insicional and dressing care: OK to remove dressings in 48 hrs and leave open to air with dry gauze PRN Orthopedic device(s): none Showering: Yes, please VTE prophylaxis: Lovenox 40mg  qd 4 wks Pain control: Norco Follow - up plan: 2 weeks Contact information:  Altamese [place] MD, Ainsley Spinner PA   Altamese [place], MD [facility], [facility] [phone]  11/01/2019, 2:18 PM  [facility] [place] [address] [phone] Domingo Sep (F)

## 2019-11-01 NOTE — Progress Notes (Signed)
Pt. Went into 130s ST at around 1920 with pressures in the high 123XX123 systolic.  Dr. Marcie Bal made aware.  One albumin given and 1L of LR given. Neo gtt started. Pt. Blood pressure continued to stay in the low 80s even with titration of neo. ECG and CBC done and reported to Dr. Marcie Bal.  Since patient was becoming more unstable we called hospitalist Sharion Settler and got patient admitted to ICU to continue neo gtt. CCM came bedside and saw patient and agreed with decision for patient to go to ICU. Pt. Troponin came back at 3180 and was reported to hospitalist Sharion Settler.  Pt. Remained A&O through event and was taken to 4N for continued care and further evaluation. Family was allowed to see patient before she went to ICU.

## 2019-11-01 NOTE — Anesthesia Postprocedure Evaluation (Signed)
Anesthesia Post Note  Patient: [name]  Procedure(s) Performed: OPEN REDUCTION INTERNAL FIXATION (ORIF) DISTAL FEMUR FRACTURE (Left )     Patient location during evaluation: PACU Anesthesia Type: General Level of consciousness: awake and alert Pain management: pain level controlled Respiratory status: spontaneous breathing, nonlabored ventilation, respiratory function stable and patient connected to nasal cannula oxygen Cardiovascular status: tachycardic (appears hypovolemic) Postop Assessment: no apparent nausea or vomiting Anesthetic complications: no Comments: Pt is mildly hypotensive and tachycardic in PACU. Urine output is low and quality is dark.  Albumin hung and neo gtt started.  CBC reveals adequate H/H. Medicine service consulted and pt to be transferred to ICU for continued close monitoring.  She is otherwise arousable, without pain, and non-labored breathing.    Last Vitals:  Vitals:   11/01/19 2000 11/01/19 2005  BP: (!) 89/58 (!) 82/59  Pulse: (!) 120 (!) 121  Resp: 14 14  Temp: (!) 36.3 C   SpO2: 98% 98%    Last Pain:  Vitals:   11/01/19 1944  TempSrc:   PainSc: [place]

## 2019-11-01 NOTE — Op Note (Addendum)
11/01/2019  5:26 PM  PATIENT:  [name]  86 y.o. female  PRE-OPERATIVE DIAGNOSIS:  LEFT SUPRACONDYLAR FEMUR FRACTURE  POST-OPERATIVE DIAGNOSIS: LEFT SUPRACONDYLAR FEMUR FRACTURE  PROCEDURE:  Procedure(s): 1. OPEN REDUCTION INTERNAL FIXATION (ORIF) DISTAL FEMUR FRACTURE (Left) WITHOUT INTERCONDYLAR EXTENSION 2. STRESS FLUORO POST FIXATION OF KNEE  SURGEON:  Surgeon(s) and Role:    Altamese [place], MD - Primary  PHYSICIAN ASSISTANT: 1. Ainsley Spinner, PA-C; 2. PA Student  ANESTHESIA:   general  EBL:  150 mL   BLOOD ADMINISTERED:none  DRAINS: none   LOCAL MEDICATIONS USED:  NONE  SPECIMEN:  No Specimen  DISPOSITION OF SPECIMEN:  N/A  COUNTS:  YES  TOURNIQUET:  * No tourniquets in log *  DICTATION: .Note written in EPIC  PLAN OF CARE: Admit to inpatient   PATIENT DISPOSITION:  PACU - hemodynamically stable.   Delay start of Pharmacological VTE agent (>24hrs) due to surgical blood loss or risk of bleeding: yes  BRIEF SUMMARY OF INDICATION FOR PROCEDURE:  [name] is a pleasant 86 y.o. who sustained severe extremity trauma in ground level fall. The patient now presents for definitive repair of this comminuted supracondylar femur fracture without intercondylar extension.  I did discuss with her the risks and benefits of surgery including the possibility of infection, nerve injury, vessel injury, DVT/ PE, loss of motion, arthritis, symptomatic hardware, malunion, nonunion, and need for further surgery among others.  After full discussion, consent was given to proceed.  BRIEF SUMMARY OF PROCEDURE:  The patient was taken to the operating room where general anesthesia was induced.  The left lower extremity was prepped and draped in usual sterile fashion.  No tourniquet was used during the procedure. After a time-out, C-arm was brought in to mark the starting point on AP and lateral images distally.  Incision was then made.  Dissection was carried carefully  down to the retinaculum, which was incised and divided.  My assistant pulled traction to de-rotate and translate the fragment into a reduced position.  This was followed by placement of a proximal drill and one lag Screw in metaphysis over drilling the near cortex and placing 4.0 mm standard screws from [employer].  On the radiolucent triangle bumps were placed to achieve appropriate alignment and translation on the AP and lateral views.  We were careful to watch for rotation throughout.  The 12-hole [employer] NCB plate was then advanced after achieving appropriate reduction through the distal incision proximally.  We then brought the knee into full extension and checked the rotation and alignment dialing this in.  This was followed by additional bicortical screw fixation proximally such that we ended with 4 bicortical screws and 6 screws into the supracondylar region distally.  The lag screw was removed for this bridge construct. All screws were extra-articular.  Wounds were irrigated thoroughly.  C-arm was brought in to confirm appropriate reduction, hardware placement, trajectory and length.  The fragments did not move with stress.  All wounds were irrigated thoroughly and then closed in standard layered fashion using #1 Vicryl, #0 Vicryl, 2-0 Vicryl, and 2-0 nylon.  A gently compressive dressing was applied from foot to thigh and then knee immobilizer.The patient was taken to PACU in stable condition.  Ainsley Spinner, PA-C, and a student assisted me throughout and required to effectively produce, control, and maintain the reduction during provisional and definitive internal fixation. He also assisted with wound closure.  PROGNOSIS:  There is increased the risks of loss of motion and  arthritis. Patient will be touch down weightbearing on the operative extremity with early mobilization encouraged. Pharmacologic DVT prophylaxis will be with Lovenox and discontinue Fosamax. If the plate is prominent or  symptomatic late removal could be considered after six to twelve months with adequate healing. Appreciate medical service care. Patient did have one episode of arrhythmia, which was not unexpected and which resolved with Dr. Marisue Brooklyn anesthesia adjustments and no further intervention has been recommended at this time but close surveillance will continue in PACU.    Astrid Divine. Marcelino Scot, M.D.

## 2019-11-01 NOTE — H&P (Signed)
History and Physical    [name] [id number] DOB: 02-19-1932 DOA: 10/31/2019  PCP: Burnard Bunting, MD   Patient coming from: Home   Chief Complaint: Left leg pain, unable to bear weight   HPI: [name] is a 85 y.o. female with medical history significant for left breast cancer, depression, remote alcoholism, chronic diastolic CHF, osteoporosis and osteoarthritis, now presenting to the emergency department with severe left leg pain.  Patient reports that she had been out grocery shopping, was trying to get the groceries back inside of her house, believes she may have tripped on a rug, causing her to pivot awkwardly and go to the ground with severe left leg pain and inability to get back up.  She denies hitting her head or losing consciousness.  She reports having an uneventful day leading up to this.  She normally ambulates with a walker, is fairly active, and is never limited by any chest pain or shortness of breath.  ED Course: Upon arrival to the ED, patient is found to be afebrile, saturating well on room air, and with stable blood pressure.  EKG features sinus rhythm with PAC, first-degree AV nodal block, RBBB, and QTc interval 509 ms.  Noncontrast head CT is negative for acute intracranial abnormality.  Cervical spine CT is negative for acute bony abnormality but notable for chronic fracture, compression, disc herniation, and degenerative changes.  Chemistry panel notable for potassium of 3.2, CBC with mild leukocytosis, and serum CK elevated to 272.  Type and screen was performed in the ED, 250 cc of normal saline was administered, patient was given multiple doses of fentanyl, and orthopedic surgery was consulted by the ED physician and recommends a medical admission to [facility].  Review of Systems:  All other systems reviewed and apart from HPI, are negative.  Past Medical History:  Diagnosis Date  . Arthritis    "in my back"  . Breast cancer ([place])  1996   left breast cancer   . Complication of anesthesia 04/18/2012   "didn't tolerate it today very well; had the shakes and very hard time w/it"  . Depression 11/20/2012  . DVT (deep venous thrombosis) ([place])   . High cholesterol   . History of alcoholism ([place])    7 1/2 years clean  . Hypertension   . Melanoma ([place]) 01/15/2004   upper right arm  . Osteoporosis   . Peripheral vascular disease ([facility])    hx of ligation   . Personal history of radiation therapy 1993   Left Breast Cancer  . SCC (squamous cell carcinoma) 01/15/2004   left post upper arm, Right elbow, post left knee  . SCC (squamous cell carcinoma) 05/21/2013   right forearm, right jawline, left cheek  . SCC (squamous cell carcinoma) 08/24/2016   right shin,right thigh  . SCC (squamous cell carcinoma) 11/01/2016  . SCC (squamous cell carcinoma) 02/02/2018   left outer arm,right outer thigh  . SCC (squamous cell carcinoma) 12/13/2018   right jawline  . Spinal stenosis     Past Surgical History:  Procedure Laterality Date  . BREAST LUMPECTOMY Left 1996  . I & D EXTREMITY  04/17/2012   Procedure: IRRIGATION AND DEBRIDEMENT EXTREMITY;  Surgeon: Rudean Haskell, MD;  Location: [place];  Service: Orthopedics;  Laterality: Right;  . KNEE ARTHROSCOPY  04/17/2012   Procedure: ARTHROSCOPY KNEE;  Surgeon: Rudean Haskell, MD;  Location: [facility];  Service: Orthopedics;  Laterality: Right;  . KYPHOPLASTY  2009, 2013  thorasic, lumbar  . MENISECTOMY Right 04/11/2012  . ORIF PERIPROSTHETIC FRACTURE Right 05/03/2018   Procedure: RIGHT RETROGRADE FEMORAL NAIL;  Surgeon: Rod Can, MD;  Location: [facility];  Service: Orthopedics;  Laterality: Right;  . POSTERIOR LAMINECTOMY / DECOMPRESSION LUMBAR SPINE  1980's  . rotator cuff surgery  Right   . TONSILLECTOMY AND ADENOIDECTOMY     "I was a child"  . TOTAL KNEE ARTHROPLASTY Right 10/29/2012   Procedure: RIGHT TOTAL KNEE ARTHROPLASTY;  Surgeon: Gearlean Alf, MD;  Location: [facility];   Service: Orthopedics;  Laterality: Right;  . VEIN LIGATION       reports that she has never smoked. She has never used smokeless tobacco. She reports that she does not drink alcohol or use drugs.  Allergies  Allergen Reactions  . Oysters [Shellfish Allergy] Nausea And Vomiting  . Codeine Nausea And Vomiting    Family History  Problem Relation Age of Onset  . Kidney failure Mother   . Anuerysm Father        AAA     Prior to Admission medications   Medication Sig Start Date End Date Taking? Authorizing Provider  acetaminophen (TYLENOL) 500 MG tablet Take 1,000 mg by mouth daily as needed (back pain).   Yes [provider]  alendronate (FOSAMAX) 70 MG tablet Take 70 mg by mouth once a week. Every Sunday. 08/21/13  Yes [provider]  Calcium Carbonate-Vitamin D (CALCIUM-D) 600-400 MG-UNIT TABS Take 1 tablet by mouth daily.   Yes [provider]  escitalopram (LEXAPRO) 20 MG tablet Take 20 mg by mouth daily. 10/08/19  Yes [provider]  Multiple Vitamins-Minerals (MULTIVITAMIN ADULT) TABS Take 1 tablet by mouth daily.  08/17/09  Yes [provider]  potassium chloride (KLOR-CON) 10 MEQ tablet TAKE 1 TABLET BY MOUTH EVERY DAY 07/11/19  Yes Croitoru, Mihai, MD  simvastatin (ZOCOR) 20 MG tablet Take 1 tablet (20 mg total) by mouth daily. 05/21/18  Yes Meng, Isaac Laud, PA  triamterene-hydrochlorothiazide (MAXZIDE) 75-50 MG tablet Take 1 tablet by mouth daily. TAKE 1 TABLET BY MOUTH DAILY. Patient taking differently: Take 0.5 tablets by mouth daily. TAKE 0.5 TABLET BY MOUTH DAILY. 08/01/19  Yes Croitoru, Mihai, MD  docusate sodium (COLACE) 100 MG capsule Take 1 capsule (100 mg total) by mouth 2 (two) times daily. Patient not taking: Reported on 10/31/2019 05/08/18   Georgette Shell, MD    Physical Exam: Vitals:   11/01/19 0215 11/01/19 0230 11/01/19 0245 11/01/19 0300  BP: 121/72 123/73 122/71 100/80  Pulse: 81 85 81 87  Resp: 17 16 20 15   Temp:       TempSrc:      SpO2: 98% 99% 99% 97%     Constitutional: NAD, calm  Eyes: PERTLA, lids and conjunctivae normal ENMT: Mucous membranes are moist. Posterior pharynx clear of any exudate or lesions.   Neck: normal, supple, no masses, no thyromegaly Respiratory:  no wheezing, no crackles. No accessory muscle use.  Cardiovascular: S1 & S2 heard, regular rate and rhythm. No extremity edema.   Abdomen: No distension, no tenderness, soft. Bowel sounds active.  Musculoskeletal: no clubbing / cyanosis. Left leg deformity and tenderness; neurovascularly intact distally.   Skin: no significant rashes, lesions, ulcers. Warm, dry, well-perfused. Neurologic: No gross facial asymmetry. Sensation intact. Moving all extremities.  Psychiatric: Alert and oriented to person, place, and situation. Very pleasant and cooperative.    Labs and Imaging on Admission: I have personally reviewed following labs and imaging studies  CBC: Recent Labs  Lab 10/31/19 2235  WBC 11.9*  HGB 12.4  HCT 37.4  MCV 95.4  PLT 123XX123   Basic Metabolic Panel: Recent Labs  Lab 10/31/19 2235  NA 140  K 3.2*  CL 101  CO2 27  GLUCOSE 138*  BUN 21  CREATININE 0.59  CALCIUM 9.5   GFR: CrCl cannot be calculated (Unknown ideal weight.). Liver Function Tests: Recent Labs  Lab 10/31/19 2235  AST 35  ALT 28  ALKPHOS 64  BILITOT 0.8  PROT 7.1  ALBUMIN 3.8   No results for input(s): LIPASE, AMYLASE in the last 168 hours. No results for input(s): AMMONIA in the last 168 hours. Coagulation Profile: No results for input(s): INR, PROTIME in the last 168 hours. Cardiac Enzymes: Recent Labs  Lab 10/31/19 2235  CKTOTAL 272*   BNP (last 3 results) No results for input(s): PROBNP in the last 8760 hours. HbA1C: No results for input(s): HGBA1C in the last 72 hours. CBG: No results for input(s): GLUCAP in the last 168 hours. Lipid Profile: No results for input(s): CHOL, HDL, LDLCALC, TRIG, CHOLHDL, LDLDIRECT in  the last 72 hours. Thyroid Function Tests: No results for input(s): TSH, T4TOTAL, FREET4, T3FREE, THYROIDAB in the last 72 hours. Anemia Panel: No results for input(s): VITAMINB12, FOLATE, FERRITIN, TIBC, IRON, RETICCTPCT in the last 72 hours. Urine analysis:    Component Value Date/Time   COLORURINE YELLOW 10/19/2014 1450   APPEARANCEUR CLEAR 10/19/2014 1450   LABSPEC 1.044 (H) 10/19/2014 1450   PHURINE 7.5 10/19/2014 1450   GLUCOSEU NEGATIVE 10/19/2014 1450   HGBUR NEGATIVE 10/19/2014 1450   BILIRUBINUR NEGATIVE 10/19/2014 1450   KETONESUR NEGATIVE 10/19/2014 1450   PROTEINUR NEGATIVE 10/19/2014 1450   UROBILINOGEN 1.0 10/19/2014 1450   NITRITE NEGATIVE 10/19/2014 1450   LEUKOCYTESUR SMALL (A) 10/19/2014 1450   Sepsis Labs: @LABRCNTIP (procalcitonin:4,lacticidven:4) )No results found for this or any previous visit (from the past 240 hour(s)).   Radiological Exams on Admission: DG Chest 2 View  Result Date: 10/31/2019 CLINICAL DATA:  86 year old female with fall. Preop chest radiograph. EXAM: CHEST - 2 VIEW COMPARISON:  Chest radiograph dated 05/21/2019. FINDINGS: Diffuse bilateral chronic interstitial coarsening and bronchitic changes. No focal consolidation, pleural effusion, or pneumothorax. Stable cardiac silhouette. Atherosclerotic calcification of the aorta. Old bilateral rib fractures. No acute osseous pathology. Osteopenia with degenerative changes of the spine and old compression fractures of the lower thoracic spine with vertebroplasty. Left axillary surgical clips. IMPRESSION: No acute cardiopulmonary process. Electronically Signed   By: Anner Crete M.D.   On: 10/31/2019 23:49   CT Head Wo Contrast  Result Date: 10/31/2019 CLINICAL DATA:  Fall EXAM: CT HEAD WITHOUT CONTRAST TECHNIQUE: Contiguous axial images were obtained from the base of the skull through the vertex without intravenous contrast. COMPARISON:  07/17/2019 FINDINGS: Brain: There is atrophy and chronic small  vessel disease changes. No acute intracranial abnormality. Specifically, no hemorrhage, hydrocephalus, mass lesion, acute infarction, or significant intracranial injury. Vascular: No hyperdense vessel or unexpected calcification. Skull: No acute calvarial abnormality. Sinuses/Orbits: Visualized paranasal sinuses and mastoids clear. Orbital soft tissues unremarkable. Other: None IMPRESSION: Atrophy, chronic microvascular disease. No acute intracranial abnormality. Electronically Signed   By: Rolm Baptise M.D.   On: 10/31/2019 23:23   CT Cervical Spine Wo Contrast  Result Date: 10/31/2019 CLINICAL DATA:  Fall EXAM: CT CERVICAL SPINE WITHOUT CONTRAST TECHNIQUE: Multidetector CT imaging of the cervical spine was performed without intravenous contrast. Multiplanar CT image reconstructions were also generated. COMPARISON:  09/15/2015  FINDINGS: Alignment: No subluxation Skull base and vertebrae: Old unfused odontoid fracture again noted, similar to prior study. Progressive degenerative change at C1-2. Stable mild compression deformity through the superior endplate of C7. No acute fracture. Soft tissues and spinal canal: Central disc herniation at C3-4 is stable. No prevertebral fluid or swelling. No visible canal hematoma. Disc levels: Disc herniation at C3-4 as above. Diffuse degenerative disc disease and facet disease. Upper chest: Biapical scarring.  No acute findings. Other: None IMPRESSION: Chronic unfused odontoid fracture with associated degenerative changes at C1-2. Degenerative changes are progressive since 2016. Stable chronic mild compression deformity of C7. Stable central disc herniation at C3-4. No acute bony abnormality. Degenerative disc and facet disease diffusely. Electronically Signed   By: Rolm Baptise M.D.   On: 10/31/2019 23:31   DG Knee Complete 4 Views Left  Result Date: 10/31/2019 CLINICAL DATA:  86 year old female with trauma to the left lower extremity. EXAM: LEFT KNEE - COMPLETE 4+ VIEW;  LEFT FEMUR 2 VIEWS COMPARISON:  None. FINDINGS: There is a displaced and angulated oblique fracture of the distal left femur involving the distal femoral metadiaphysis. There is approximately 3 cm medial displacement of the distal fracture fragment and posterior angulation. There is advanced osteopenia. Severe arthritic changes of the left knee with tricompartmental narrowing most pronounced involving the lateral compartment. There is no dislocation. There is a small suprapatellar effusion. The soft tissues are unremarkable. Vascular calcifications noted. IMPRESSION: 1. Displaced and angulated oblique fracture of the distal left femur. No dislocation. 2. Osteopenia and severe osteoarthritic changes of the left knee. Electronically Signed   By: Anner Crete M.D.   On: 10/31/2019 23:28   DG FEMUR MIN 2 VIEWS LEFT  Result Date: 10/31/2019 CLINICAL DATA:  86 year old female with trauma to the left lower extremity. EXAM: LEFT KNEE - COMPLETE 4+ VIEW; LEFT FEMUR 2 VIEWS COMPARISON:  None. FINDINGS: There is a displaced and angulated oblique fracture of the distal left femur involving the distal femoral metadiaphysis. There is approximately 3 cm medial displacement of the distal fracture fragment and posterior angulation. There is advanced osteopenia. Severe arthritic changes of the left knee with tricompartmental narrowing most pronounced involving the lateral compartment. There is no dislocation. There is a small suprapatellar effusion. The soft tissues are unremarkable. Vascular calcifications noted. IMPRESSION: 1. Displaced and angulated oblique fracture of the distal left femur. No dislocation. 2. Osteopenia and severe osteoarthritic changes of the left knee. Electronically Signed   By: Anner Crete M.D.   On: 10/31/2019 23:28    EKG: Independently reviewed. Sinus rhythm, 1st degree AVB, RBBB, QTc 509 ms.   Assessment/Plan   1. Distal left femur fracture  - Presents with severe left leg pain after  pivoting awkwardly and possibly tripping on a rug and is found to have distal left femur fracture  - Orthopedic surgery is consulting and much appreciated   - Based on the available data, Mrs. Hausler presents an estimated 1% risk of perioperative MI or cardiac arrest  - Replace electrolytes, repeat EKG in am, keep NPO, immobilize knee, continue pain-control, optimize fluid status    2. Prolonged QT interval  - QTc is 509 ms in ED  - Replace potassium to 4 and mag to 2, minimize QT-prolonging medications, repeat EKG in am   3. Hypokalemia  - Serum potassium is 3.2 in ED with prolonged QT interval  - Replace potassium, check mag level, hold diuretics for now while NPO    4. Chronic diastolic CHF  -  Appears hypovolemic in ED and was given small fluid bolus  - Continue gentle IVF hydration while NPO, follow daily wt and I/Os    5. Hypertension   - BP at goal, hold diuretics while NPO     DVT prophylaxis: SCDs Code Status: Full  Family Communication: Husband updated at bedside Disposition Plan: Pending clearance from surgery will likely need SNF  Consults called: Orthopedic surgery  Admission status: Inpatient     Vianne Bulls, MD [facility] Pager: See [email or web address]  If 7AM-7PM, please contact the daytime attending [email or web address]  11/01/2019, 3:57 AM

## 2019-11-01 NOTE — Transfer of Care (Signed)
Immediate Anesthesia Transfer of Care Note  Patient: Rogelia Mire  Procedure(s) Performed: OPEN REDUCTION INTERNAL FIXATION (ORIF) DISTAL FEMUR FRACTURE (Left )  Patient Location: PACU  Anesthesia Type:General  Level of Consciousness: oriented, patient cooperative and responds to stimulation  Airway & Oxygen Therapy: Patient Spontanous Breathing and Patient connected to nasal cannula oxygen  Post-op Assessment: Report given to RN and Post -op Vital signs reviewed and stable  Post vital signs: Reviewed and stable  Last Vitals:  Vitals Value Taken Time  BP 102/63 (75)   Temp    Pulse 98   Resp 25   SpO2 100     Last Pain:  Vitals:   11/01/19 0930  TempSrc:   PainSc: 4       Patients Stated Pain Goal: 5 ([id number])  Complications: No apparent anesthesia complications

## 2019-11-02 ENCOUNTER — Inpatient Hospital Stay (HOSPITAL_COMMUNITY): Payer: Medicare Other

## 2019-11-02 DIAGNOSIS — R079 Chest pain, unspecified: Secondary | ICD-10-CM

## 2019-11-02 LAB — CBC WITH DIFFERENTIAL/PLATELET
Abs Immature Granulocytes: 0.05 10*3/uL (ref 0.00–0.07)
Basophils Absolute: 0 10*3/uL (ref 0.0–0.1)
Basophils Relative: 0 %
Eosinophils Absolute: 0 10*3/uL (ref 0.0–0.5)
Eosinophils Relative: 0 %
HCT: 37 % (ref 36.0–46.0)
Hemoglobin: 12 g/dL (ref 12.0–15.0)
Immature Granulocytes: 0 %
Lymphocytes Relative: 8 %
Lymphs Abs: 1 10*3/uL (ref 0.7–4.0)
MCH: 31.3 pg (ref 26.0–34.0)
MCHC: 32.4 g/dL (ref 30.0–36.0)
MCV: 96.6 fL (ref 80.0–100.0)
Monocytes Absolute: 1.1 10*3/uL — ABNORMAL HIGH (ref 0.1–1.0)
Monocytes Relative: 10 %
Neutro Abs: 9.7 10*3/uL — ABNORMAL HIGH (ref 1.7–7.7)
Neutrophils Relative %: 82 %
Platelets: 244 10*3/uL (ref 150–400)
RBC: 3.83 MIL/uL — ABNORMAL LOW (ref 3.87–5.11)
RDW: 13.2 % (ref 11.5–15.5)
WBC: 11.9 10*3/uL — ABNORMAL HIGH (ref 4.0–10.5)
nRBC: 0 % (ref 0.0–0.2)

## 2019-11-02 LAB — COMPREHENSIVE METABOLIC PANEL
ALT: 37 U/L (ref 0–44)
AST: 55 U/L — ABNORMAL HIGH (ref 15–41)
Albumin: 3.4 g/dL — ABNORMAL LOW (ref 3.5–5.0)
Alkaline Phosphatase: 58 U/L (ref 38–126)
Anion gap: 12 (ref 5–15)
BUN: 18 mg/dL (ref 8–23)
CO2: 24 mmol/L (ref 22–32)
Calcium: 8.7 mg/dL — ABNORMAL LOW (ref 8.9–10.3)
Chloride: 102 mmol/L (ref 98–111)
Creatinine, Ser: 0.75 mg/dL (ref 0.44–1.00)
GFR calc Af Amer: >60 mL/min (ref >60)
GFR calc non Af Amer: >60 mL/min (ref >60)
Glucose, Bld: 162 mg/dL — ABNORMAL HIGH (ref 70–99)
Potassium: 3.8 mmol/L (ref 3.5–5.1)
Sodium: 138 mmol/L (ref 135–145)
Total Bilirubin: 1.4 mg/dL — ABNORMAL HIGH (ref 0.3–1.2)
Total Protein: 5.9 g/dL — ABNORMAL LOW (ref 6.5–8.1)

## 2019-11-02 LAB — MRSA PCR SCREENING: MRSA by PCR: NEGATIVE

## 2019-11-02 LAB — MAGNESIUM: Magnesium: 1.6 mg/dL — ABNORMAL LOW (ref 1.7–2.4)

## 2019-11-02 LAB — ECHOCARDIOGRAM COMPLETE
Height: 66 in
Weight: 2395.08 oz

## 2019-11-02 LAB — TROPONIN I (HIGH SENSITIVITY): Troponin I (High Sensitivity): 3375 ng/L (ref <18)

## 2019-11-02 LAB — VITAMIN D 25 HYDROXY (VIT D DEFICIENCY, FRACTURES): Vit D, 25-Hydroxy: 35.29 ng/mL (ref 30–100)

## 2019-11-02 LAB — CK: Total CK: 243 U/L — ABNORMAL HIGH (ref 38–234)

## 2019-11-02 LAB — PHOSPHORUS: Phosphorus: 3.5 mg/dL (ref 2.5–4.6)

## 2019-11-02 MED ORDER — LACTATED RINGERS IV BOLUS
250.0000 mL | Freq: Once | INTRAVENOUS | Status: AC
  Administered 2019-11-02: 250 mL via INTRAVENOUS

## 2019-11-02 MED ORDER — ENSURE ENLIVE PO LIQD: 237.0000 mL | Freq: Two times a day (BID) | ORAL | Status: DC

## 2019-11-02 MED ORDER — MAGNESIUM SULFATE 2 GM/50ML IV SOLN
2.0000 g | Freq: Once | INTRAVENOUS | Status: AC
  Administered 2019-11-02: 2 g via INTRAVENOUS
  Filled 2019-11-02: qty 50

## 2019-11-02 MED ORDER — LACTATED RINGERS IV BOLUS
500.0000 mL | Freq: Once | INTRAVENOUS | Status: AC
  Administered 2019-11-02: 500 mL via INTRAVENOUS

## 2019-11-02 MED ORDER — JUVEN PO PACK
1.0000 | PACK | Freq: Two times a day (BID) | ORAL | Status: DC
  Administered 2019-11-02 – 2019-11-05 (×5): 1 via ORAL
  Filled 2019-11-02 (×6): qty 1

## 2019-11-02 MED ORDER — ADULT MULTIVITAMIN W/MINERALS CH
1.0000 | ORAL_TABLET | Freq: Every day | ORAL | Status: DC
  Administered 2019-11-02 – 2019-11-08 (×7): 1 via ORAL
  Filled 2019-11-02 (×7): qty 1

## 2019-11-02 NOTE — Progress Notes (Signed)
    Subjective: Patient reports pain as mild to moderate-fairly well controlled with Tylenol.  Up to edge of bed with therapy.  Somewhat hesitant to mobilize.  She worked as a Community education officer previously.  Objective:   VITALS:   Vitals:   11/02/19 1300 11/02/19 1400 11/02/19 1500 11/02/19 1600  BP: (!) 134/29 (!) 90/51 (!) 97/56 (!) 97/59  Pulse: 84 90 87 92  Resp: 15 (!) 22 (!) 22 (!) 21  Temp:    97.7 F (36.5 C)  TempSrc:    Oral  SpO2: 91% (!) 89% 95% 98%  Weight:      Height:       CBC Latest Ref Rng & Units 11/02/2019 11/01/2019 11/01/2019  WBC 4.0 - 10.5 K/uL 11.9(H) 11.9(H) 9.6  Hemoglobin 12.0 - 15.0 g/dL 12.0 11.7(L) 11.0(L)  Hematocrit 36.0 - 46.0 % 37.0 36.6 33.7(L)  Platelets 150 - 400 K/uL 244 190 215   BMP Latest Ref Rng & Units 11/02/2019 11/01/2019 11/01/2019  Glucose 70 - 99 mg/dL 162(H) - 114(H)  BUN 8 - 23 mg/dL 18 - 18  Creatinine 0.44 - 1.00 mg/dL 0.75 0.72 0.51  BUN/Creat Ratio 12 - 28 - - -  Sodium 135 - 145 mmol/L 138 - 141  Potassium 3.5 - 5.1 mmol/L 3.8 - 3.0(L)  Chloride 98 - 111 mmol/L 102 - 103  CO2 22 - 32 mmol/L 24 - 26  Calcium 8.9 - 10.3 mg/dL 8.7(L) - 8.2(L)   Intake/Output      04/09 0701 - 04/10 0700 04/10 0701 - 04/11 0700   P.O. 240 360   I.V. (mL/kg) 180.7 (2.7) 6.9 (0.1)   Other 1050    IV Piggyback 775.4 604.1   Total Intake(mL/kg) 2246 (33.1) 971.1 (14.3)   Urine (mL/kg/hr) 1705 (1)    Blood 150    Total Output 1855    Net +391 +971.1           Physical Exam: General: NAD.  Upright in bed.  Calm, sharp, conversant. Resp: No increased wob  MSK LLE: Lateral Mepilex dressing intact with scant drainage. Feet warm Sensation intact distally Dorsiflexion/Plantar flexion, EHL/FHL intact Incision: dressing C/D/I   Assessment: 1 Day Post-Op  S/P Procedure(s) (LRB): OPEN REDUCTION INTERNAL FIXATION (ORIF) DISTAL FEMUR FRACTURE (Left) by Dr. Marcelino Scot on 11/01/2018   Principal Problem:   Closed fracture of distal end of left  femur, initial encounter ([place]) Active Problems:   Hypokalemia   Essential hypertension, benign   Diastolic dysfunction without heart failure   Prolonged QT interval   Other fracture of left femur, initial encounter for closed fracture ([facility])   Hip fracture ([facility])   Postprocedural hypotension   Closed left distal femur fracture, status post ORIF Stable from an orthopedic perspective postop day 1 Pain controlled with Tylenol Up to edge of bed with therapy  Plan:  Up with therapy Incentive Spirometry Elevate and Apply ice  Weightbearing: TDWB LLE Insicional and dressing care: Mepilex as needed VTE prophylaxis: SCDs, ambulation.  Lovenox when okay per CCM/medicine Pain control: Tylenol.  Minimize narcotics. Follow - up plan: Dr. Marcelino Scot 10 to 14 days  Dispo: TBD.  Discharge per medicine/CCM when ready medically and mobilized.  Therapy evaluations ongoing.   Prudencio Burly III, PA-C 11/02/2019, 5:07 PM

## 2019-11-02 NOTE — Progress Notes (Signed)
Pt refused RN offer to assist her to side of bed and possibly try to stand with walker. Pt stated she would wait for therapy. Assured pt RN is capable but patient is too fearful. Pt has been doing her exercises that PT instructed. Patient also declines bath and CHG due to not wanting to hurt her leg.[name]

## 2019-11-02 NOTE — Progress Notes (Signed)
  Echocardiogram 2D Echocardiogram has been performed.  [name] 11/02/2019, 9:21 AM

## 2019-11-02 NOTE — Progress Notes (Signed)
[facility] Progress Note Patient Name: [name] DOB: [birth year] MRN: [id number]   Date of Service  11/02/2019  HPI/Events of Note  Soft blood pressure and oliguria.  eICU Interventions  Lactated Ringers 250 ml iv bolus x 1.        Kerry Kass Banyan Goodchild 11/02/2019, 10:27 PM

## 2019-11-02 NOTE — Evaluation (Signed)
Physical Therapy Evaluation Patient Details Name: [name] MRN: [id number] DOB: 02-04-1932 Today's Date: 11/02/2019   History of Present Illness  86 y.o. F with PMH of OA<  L breast Ca, HTN, DVT, Depression, squamous cell carcinoma and melanoma who ambulates with a cane and walker at baseline and sustained a mechanical fall at home. Pt found to have L distal femur fx s/p ORIF 4/9. Pt with hypotension post-op requiring pressors and admission to ICU.  Clinical Impression  Pt presents to PT with deficits in functional mobility, strength, power, balance, activity tolerance, cognition. Pt in significant pain and with great fear of falling this session, limiting mobility progression. Pt requires significant physical assistance to perform all mobility during session and is unable to transfer out of bed due to safety concerns and falls risk. Pt will continue to benefit from PT POC to improve activity tolerance and reduce caregiver burden.    Follow Up Recommendations SNF;Supervision/Assistance - 24 hour    Equipment Recommendations  Wheelchair (measurements PT);Wheelchair cushion (measurements PT);Hospital bed;Other (comment)(mechanical lift, if D/C today)    Recommendations for Other Services       Precautions / Restrictions Precautions Precautions: Fall Restrictions Weight Bearing Restrictions: Yes LLE Weight Bearing: Touchdown weight bearing      Mobility  Bed Mobility Overal bed mobility: Needs Assistance Bed Mobility: Supine to Sit;Sit to Supine     Supine to sit: Max assist Sit to supine: Max assist      Transfers Overall transfer level: Needs assistance Equipment used: 1 person hand held assist Transfers: Sit to/from Stand Sit to Stand: Total assist;From elevated surface(partial stand, able to clear buttocks a few inches)         General transfer comment: PT providing R knee block and BUE support to pt, also assisting hip extension with  pad  Ambulation/Gait                Stairs            Wheelchair Mobility    Modified Rankin (Stroke Patients Only)       Balance Overall balance assessment: Needs assistance Sitting-balance support: Bilateral upper extremity supported;Feet supported Sitting balance-Leahy Scale: Poor Sitting balance - Comments: minA at edge of bed for static sitting balance Postural control: Right lateral lean Standing balance support: Bilateral upper extremity supported Standing balance-Leahy Scale: Zero Standing balance comment: totalA to maintain partial stand                             Pertinent Vitals/Pain Pain Assessment: Faces Faces Pain Scale: Hurts whole lot Pain Location: LLE Pain Descriptors / Indicators: Grimacing Pain Intervention(s): Monitored during session    Home Living Family/patient expects to be discharged to:: Private residence Living Arrangements: Spouse/significant other Available Help at Discharge: Family;Available 24 hours/day(spouse is 35 years old, in good health per pt) Type of Home: House Home Access: Stairs to enter Entrance Stairs-Rails: Can reach both Entrance Stairs-Number of Steps: 3 Home Layout: Multi-level;Able to live on main level with bedroom/bathroom Home Equipment: Gilford Rile - 2 wheels;Walker - 4 wheels;Shower seat - built in;Grab bars - tub/shower      Prior Function Level of Independence: Independent with assistive device(s)         Comments: pt utilizies 4 wheeled walker without seat for mobility. Pt is former PT     Hand Dominance   Dominant Hand: Right    Extremity/Trunk Assessment   Upper Extremity Assessment Upper  Extremity Assessment: Generalized weakness    Lower Extremity Assessment Lower Extremity Assessment: Generalized weakness(4-/5 RLE, 3/5 L PF/DF, 2/5 L knee extension)    Cervical / Trunk Assessment Cervical / Trunk Assessment: Kyphotic  Communication   Communication: No difficulties   Cognition Arousal/Alertness: Awake/alert Behavior During Therapy: WFL for tasks assessed/performed Overall Cognitive Status: Impaired/Different from baseline Area of Impairment: Memory                     Memory: Decreased recall of precautions;Decreased short-term memory         General Comments: pt intermittently confused with PT name and role at times, confusing PT with female nurse tech      General Comments General comments (skin integrity, edema, etc.): VSS on RA    Exercises General Exercises - Lower Extremity Ankle Circles/Pumps: AROM;Both;10 reps Quad Sets: AROM;Left;5 reps Gluteal Sets: AROM;Both;10 reps Straight Leg Raises: AROM;Right;5 reps   Assessment/Plan    PT Assessment Patient needs continued PT services  PT Problem List Decreased strength;Decreased activity tolerance;Decreased balance;Decreased mobility;Decreased cognition;Decreased knowledge of use of DME;Decreased safety awareness;Decreased knowledge of precautions;Pain       PT Treatment Interventions DME instruction;Gait training;Stair training;Functional mobility training;Therapeutic activities;Therapeutic exercise;Balance training;Neuromuscular re-education;Cognitive remediation;Patient/family education;Wheelchair mobility training    PT Goals (Current goals can be found in the Care Plan section)  Acute Rehab PT Goals Patient Stated Goal: To improve mobility and reduce falls risk PT Goal Formulation: With patient/family Time For Goal Achievement: 11/16/19 Potential to Achieve Goals: Fair Additional Goals Additional Goal #1: Pt will mobilize in manual wheelchair for 50' with use of BUE and with minA.    Frequency Min 3X/week   Barriers to discharge        Co-evaluation               AM-PAC PT "6 Clicks" Mobility  Outcome Measure Help needed turning from your back to your side while in a flat bed without using bedrails?: A Lot Help needed moving from lying on your back to  sitting on the side of a flat bed without using bedrails?: Total Help needed moving to and from a bed to a chair (including a wheelchair)?: Total Help needed standing up from a chair using your arms (e.g., wheelchair or bedside chair)?: Total Help needed to walk in hospital room?: Total Help needed climbing 3-5 steps with a railing? : Total 6 Click Score: 7    End of Session   Activity Tolerance: Patient limited by pain(limited by anxiety/fear of falling) Patient left: in bed;with call bell/phone within reach;with nursing/sitter in room;with family/visitor present;with bed alarm set Nurse Communication: Mobility status PT Visit Diagnosis: Muscle weakness (generalized) (M62.81);History of falling (Z91.81);Unsteadiness on feet (R26.81);Pain Pain - Right/Left: Left Pain - part of body: Leg    Time: 1203-1249 PT Time Calculation (min) (ACUTE ONLY): 46 min   Charges:   PT Evaluation $PT Eval Moderate Complexity: 1 Mod PT Treatments $Therapeutic Activity: 8-22 mins        Zenaida Niece, PT, DPT Acute Rehabilitation Pager: [phone]   Zenaida Niece 11/02/2019, 1:47 PM

## 2019-11-02 NOTE — Progress Notes (Signed)
Initial Nutrition Assessment  DOCUMENTATION CODES:   Not applicable  INTERVENTION:  -Ensure Enlive po BID, each supplement provides 350 kcal and 20 grams of protein -1 packet Juven BID, each packet provides 95 calories, 2.5 grams of protein (collagen), and 9.8 grams of carbohydrate (3 grams sugar); also contains 7 grams of L-arginine and L-glutamine, 300 mg vitamin C, 15 mg vitamin E, 1.2 mcg vitamin B-12, 9.5 mg zinc, 200 mg calcium, and 1.5 g  Calcium Beta-hydroxy-Beta-methylbutyrate to support wound healing -MVI with minerals daily  NUTRITION DIAGNOSIS:   Increased nutrient needs related to post-op healing as evidenced by estimated needs.    GOAL:   Patient will meet greater than or equal to 90% of their needs   MONITOR:   Labs, I & O's, Skin, Supplement acceptance, PO intake, Weight trends  REASON FOR ASSESSMENT:   Consult Hip fracture protocol  ASSESSMENT:  RD working remotely.  86 year old female admitted for left hip fx s/p mechanical fall at home after tripping on a rug while bringing groceries inside. PMHx significant for left breast cancer, depression, remote alcoholism, chronic diastolic CHF, osteoporosis.  Patient is s/p ORIF of L femur on 4/9  Per chart review, patient was admitted to ICU post-op due to hypotension requiring pressor therapy. Patient was weaned off pressors this morning, noted awake and pleasantly confused. Diet advanced to regular, per flowsheets she ate 10% of breakfast this morning. Will provide Ensure to aid with estimated needs as well as Juven to support post-op wound healing.   Current wt 149.38 lbs Weight history reviewed, stable over the past year.  Per notes: -post-op hypotension likely sedation related -500 ml LR this AM, remain off Levo if able -demand ischemia, peak trop 3672 -no signs of ischemia on EKG -Echo completed  BP: 97/56 (cuff) MAP: 69 (cuff) I/Os: +391 ml since admit UOP: 1705 x 24 hrs  Medications reviewed   IVPB: Ancef Levo 6 mcg Labs: Mg 1.6 (L)   NUTRITION - FOCUSED PHYSICAL EXAM: Unable to complete at this time, RD working remotely.   Diet Order:   Diet Order            Diet regular Room service appropriate? Yes; Fluid consistency: Thin  Diet effective now              EDUCATION NEEDS:   No education needs have been identified at this time  Skin:  Skin Assessment: Skin Integrity Issues: Skin Integrity Issues:: Incisions Incisions: closed; L leg  Last BM:  PTA  Height:   Ht Readings from Last 1 Encounters:  11/01/19 5\' 6"  (1.676 m)    Weight:   Wt Readings from Last 1 Encounters:  11/02/19 67.9 kg    Ideal Body Weight:     BMI:  Body mass index is 24.16 kg/m.  Estimated Nutritional Needs:   Kcal:  1700-1900  Protein:  85-95  Fluid:  >/= 1.7 L/day   Lajuan Lines, RD, LDN Clinical Nutrition After Hours/Weekend Pager # in [place]

## 2019-11-02 NOTE — Progress Notes (Signed)
PCCM Interval Note   On PM evaluation, patient remains hemodynamically stable without vasopressor requirement. Will transfer to floor. [facility] contacted for pick-up tomorrow on 4/11.  Rodman Pickle, M.D. [facility] Pulmonary/Critical Care Medicine 11/02/2019 2:07 PM

## 2019-11-02 NOTE — Consult Note (Signed)
NAME:  [name], MRN:  [id number], DOB:  1932-04-19, LOS: 1 ADMISSION DATE:  10/31/2019, CONSULTATION DATE:  11/02/19 REFERRING MD:  Marcelino Scot, CHIEF COMPLAINT:  Hip pain   Brief History   86 y.o. F with fall from standing at home suffering L hip fx, went for ORIF today and post-op was hypotensive requiring pressors, so PCCM consulted for admission.  History of present illness   86 y.o. F with PMH of OA<  L breast Ca, HTN, DVT, Depression, squamous cell carcinoma and melanoma who ambulates with a cane and walker at baseline and sustained a mechanical fall at home when walker caught on a rug.   She lowered herself to the ground sustaining injury to the L knee and was on the ground for 6hrs.   In the ED, head CT was negative, found to have distal L femur fracture. She did not have signs of acute infection and was hemodynamically stable.  Pt was admitted to the hospitalist service and taken to the OR on 4/9 for ORIF.  Post-op, pt was hypotensive and was started on neosynephrine.  EBL 150cc and received approximately 2L IVF.    On admission, pt is awake and alert and denies any significant discomfort.   Past Medical History   has a past medical history of Arthritis, Breast cancer ([place]) ([id number]), Complication of anesthesia (04/18/2012), Depression (11/20/2012), DVT (deep venous thrombosis) ([place]), High cholesterol, History of alcoholism ([place]), Hypertension, Melanoma ([place]) (01/15/2004), Osteoporosis, Peripheral vascular disease ([place]), Personal history of radiation therapy (1993), SCC (squamous cell carcinoma) (01/15/2004), SCC (squamous cell carcinoma) (05/21/2013), SCC (squamous cell carcinoma) (08/24/2016), SCC (squamous cell carcinoma) (11/01/2016), SCC (squamous cell carcinoma) (02/02/2018), SCC (squamous cell carcinoma) (12/13/2018), and Spinal stenosis.   Significant Hospital Events   4/8 Admit to hospitalist service 4/9 ORIF, post-op hypotension and PCCM consult  Consults:   Ortho PCCM  Procedures:  4/9 ORIF  Significant Diagnostic Tests:  4/8 CT head>>atrophy, no acute findings 4/8>>no acute findings 4/8 Knee xray>>Displaced and angulated oblique fracture of the distal left femur. No dislocation.  Micro Data:  4/8 Covid-19>>negative  Antimicrobials:  Ancef 4/9-  Interim history/subjective:  Admitted to ICU overnight. Weaned off pressors this morning however borderline MAPs. AAOx4  Objective   Blood pressure (!) 85/52, pulse 82, temperature 98 F (36.7 C), temperature source Axillary, resp. rate 15, height 5\' 6"  (1.676 m), weight 67.9 kg, SpO2 98 %.        Intake/Output Summary (Last 24 hours) at 11/02/2019 0725 Last data filed at 11/02/2019 0600 Gross per 24 hour  Intake 2246.03 ml  Output 1855 ml  Net 391.03 ml   Filed Weights   11/01/19 0904 11/02/19 0500  Weight: 63.5 kg 67.9 kg   Physical Exam: General: Elderly, thin female, no acute distress HENT: , AT, OP clear, MMM Eyes: EOMI, no scleral icterus Respiratory: Clear to auscultation bilaterally.  No crackles, wheezing or rales Cardiovascular: RRR, -M/R/G, no JVD Extremities:-Edema,-tenderness Neuro: AAO x4, CNII-XII grossly intact Skin: Intact, no rashes or bruising Psych: Normal mood, normal affect  Resolved Hospital Problem list     Assessment & Plan:    Post-op Hypotension in the setting of L Femur fracture s/p ORIF  -Likely sedation related, low suspicion for sepsis  -Hgb stable -received albumin and ~2L IVF peri-operatively  P: -Give 500cc LR this AM -Remain off Levo if able. Goal MAP >60  Demand ischemia -Peak trop 3672 -no signs of ischemia on EKG P: -Follow-up echo (last in 2019 with grade  1 diastolic dysfunction and EF 60-65%)  Hypokalemia/hypomagnesemia -received 3g IV mag and K in IVF P: -Replete mag  HTN -hold home Maxzide  History of Depression -Holding Lexapro   Best practice:  Diet: Regular diet Pain/Anxiety/Delirium protocol (if  indicated): PO, IV PRNs VAP protocol (if indicated): n/a DVT prophylaxis: SCD's GI prophylaxis: n/a Glucose control: ssi Mobility: bed rest Code Status: full code Family Communication: Updated patient at bedside Disposition: ICU  Labs   CBC: Recent Labs  Lab 10/31/19 2235 11/01/19 0430 11/01/19 1914 11/02/19 0029  WBC 11.9* 9.6 11.9* 11.9*  NEUTROABS  --   --   --  9.7*  HGB 12.4 11.0* 11.7* 12.0  HCT 37.4 33.7* 36.6 37.0  MCV 95.4 95.2 96.8 96.6  PLT 237 215 190 [id number]    Basic Metabolic Panel: Recent Labs  Lab 10/31/19 2235 11/01/19 0430 11/01/19 1914 11/02/19 0029  NA 140 141  --  138  K 3.2* 3.0*  --  3.8  CL 101 103  --  102  CO2 27 26  --  24  GLUCOSE 138* 114*  --  162*  BUN 21 18  --  18  CREATININE 0.59 0.51 0.72 0.75  CALCIUM 9.5 8.2*  --  8.7*  MG  --  1.3*  --  1.6*  PHOS  --   --   --  3.5   GFR: Estimated Creatinine Clearance: 48.1 mL/min (by C-G formula based on SCr of 0.75 mg/dL). Recent Labs  Lab 10/31/19 2235 11/01/19 0430 11/01/19 1914 11/02/19 0029  WBC 11.9* 9.6 11.9* 11.9*    Liver Function Tests: Recent Labs  Lab 10/31/19 2235 11/02/19 0029  AST 35 55*  ALT 28 37  ALKPHOS 64 58  BILITOT 0.8 1.4*  PROT 7.1 5.9*  ALBUMIN 3.8 3.4*   No results for input(s): LIPASE, AMYLASE in the last 168 hours. No results for input(s): AMMONIA in the last 168 hours.  ABG No results found for: PHART, PCO2ART, PO2ART, HCO3, TCO2, ACIDBASEDEF, O2SAT   Coagulation Profile: No results for input(s): INR, PROTIME in the last 168 hours.  Cardiac Enzymes: Recent Labs  Lab 10/31/19 2235 11/02/19 0029  CKTOTAL 272* 243*    HbA1C: No results found for: HGBA1C  CBG: No results for input(s): GLUCAP in the last 168 hours.   Critical care time: 31 minutes    The patient is critically ill with multiple organ systems failure and requires high complexity decision making for assessment and support, frequent evaluation and titration of  therapies, application of advanced monitoring technologies and extensive interpretation of multiple databases.   Rodman Pickle, M.D. [facility] Pulmonary/Critical Care Medicine 11/02/2019 7:34 AM   Please see Amion for pager number to reach on-call Pulmonary and Critical Care Team.

## 2019-11-03 DIAGNOSIS — I10 Essential (primary) hypertension: Secondary | ICD-10-CM

## 2019-11-03 DIAGNOSIS — I214 Non-ST elevation (NSTEMI) myocardial infarction: Secondary | ICD-10-CM

## 2019-11-03 DIAGNOSIS — E7800 Pure hypercholesterolemia, unspecified: Secondary | ICD-10-CM

## 2019-11-03 LAB — BASIC METABOLIC PANEL
Anion gap: 9 (ref 5–15)
BUN: 26 mg/dL — ABNORMAL HIGH (ref 8–23)
CO2: 29 mmol/L (ref 22–32)
Calcium: 9 mg/dL (ref 8.9–10.3)
Chloride: 98 mmol/L (ref 98–111)
Creatinine, Ser: 0.79 mg/dL (ref 0.44–1.00)
GFR calc Af Amer: >60 mL/min (ref >60)
GFR calc non Af Amer: >60 mL/min (ref >60)
Glucose, Bld: 124 mg/dL — ABNORMAL HIGH (ref 70–99)
Potassium: 4.1 mmol/L (ref 3.5–5.1)
Sodium: 136 mmol/L (ref 135–145)

## 2019-11-03 LAB — CBC
HCT: 32.1 % — ABNORMAL LOW (ref 36.0–46.0)
Hemoglobin: 10.3 g/dL — ABNORMAL LOW (ref 12.0–15.0)
MCH: 30.9 pg (ref 26.0–34.0)
MCHC: 32.1 g/dL (ref 30.0–36.0)
MCV: 96.4 fL (ref 80.0–100.0)
Platelets: 160 10*3/uL (ref 150–400)
RBC: 3.33 MIL/uL — ABNORMAL LOW (ref 3.87–5.11)
RDW: 13.3 % (ref 11.5–15.5)
WBC: 8.4 10*3/uL (ref 4.0–10.5)
nRBC: 0 % (ref 0.0–0.2)

## 2019-11-03 MED ORDER — ENOXAPARIN SODIUM 40 MG/0.4ML ~~LOC~~ SOLN
40.0000 mg | SUBCUTANEOUS | Status: DC
  Administered 2019-11-03 – 2019-11-05 (×3): 40 mg via SUBCUTANEOUS
  Filled 2019-11-03 (×3): qty 0.4

## 2019-11-03 MED ORDER — ROSUVASTATIN CALCIUM 20 MG PO TABS
20.0000 mg | ORAL_TABLET | Freq: Every day | ORAL | Status: DC
  Administered 2019-11-03 – 2019-11-07 (×5): 20 mg via ORAL
  Filled 2019-11-03 (×5): qty 1

## 2019-11-03 NOTE — Progress Notes (Signed)
Pt's wedding rings in pink denture cup with name on it. Placed in pt's green belongings bag. Encouraged her to let husband take them home when he visits.

## 2019-11-03 NOTE — Progress Notes (Signed)
PROGRESS NOTE    [name]  [id number] DOB: [birth year] DOA: 10/31/2019 PCP: Burnard Bunting, MD     Brief Narrative:  [name] is an 86 yo female with past medical history significant for HTN, NSVT, PVC, diastolic heart failure, history of left breast cancer, depression, osteoporosis, osteoarthritis who presented to the hospital with left leg pain. She had been grocery shopping, tripped on a rug and fell to the ground.  In the emergency department, she was found to have distal left femur fracture.  She was transferred from [facility] to [facility] for surgical intervention.  Postoperatively, she developed hypotension and was transferred to the ICU for pressors.  New events last 24 hours / Subjective: Patient's blood pressure stable off of pressor.  She denies any pain on examination this morning.  Denies any chest pain or shortness of breath, no new back pain from her baseline.  No nausea or vomiting.  Assessment & Plan:   Principal Problem:   Closed fracture of distal end of left femur, initial encounter ([place]) Active Problems:   Hypokalemia   Essential hypertension, benign   Diastolic dysfunction without heart failure   Prolonged QT interval   Other fracture of left femur, initial encounter for closed fracture (HCC)   Hip fracture (HCC)   Postprocedural hypotension   Distal left femur fracture -Status post ORIF by Dr. Marcelino Scot 4/9 -Pain control -PT recommends SNF placement  Elevated troponin -Peak troponin 3672 -Echocardiogram revealed EF 40 to 45% with global hypokinesis, severe left ventricular hypertrophy, grade 2 diastolic dysfunction -Cardiology consulted  Postop hypotension -Thought to be sedation related, now off pressor and stable blood pressure  Essential hypertension -Hold home Maxide  Chronic diastolic heart failure -Does not appear to be fluid overloaded on exam   HLD -Continue zocor     DVT prophylaxis: Lovenox Code  Status: Full Family Communication: None at bedside  Disposition Plan:  . Patient is from home prior to admission. . Currently in-hospital treatment needed due to further cardiology evaluation, patient will need SNF placement for discharge. . Suspect patient will discharge to SNF in 1 to 2 days.     Consultants:   Orthopedic surgery  PCCM  Cardiology   Antimicrobials:  Anti-infectives (From admission, onward)   Start     Dose/Rate Route Frequency Ordered Stop   11/01/19 2100  ceFAZolin (ANCEF) IVPB 1 g/50 mL premix     1 g 100 mL/hr over 30 Minutes Intravenous Every 6 hours 11/01/19 2037 11/02/19 1646   11/01/19 1145  ceFAZolin (ANCEF) IVPB 2g/100 mL premix     2 g 200 mL/hr over 30 Minutes Intravenous On call to O.R. 11/01/19 1141 11/01/19 1533        Objective: Vitals:   11/03/19 0500 11/03/19 0600 11/03/19 0700 11/03/19 0800  BP: 104/64 (!) 104/56 (!) 102/58 (!) 108/59  Pulse: 80 80 80 91  Resp: 16 18 17  (!) 27  Temp:    97.9 F (36.6 C)  TempSrc:    Oral  SpO2: 100% 100% 100% 92%  Weight:      Height:        Intake/Output Summary (Last 24 hours) at 11/03/2019 0941 Last data filed at 11/03/2019 0800 Gross per 24 hour  Intake 1944.14 ml  Output 1035 ml  Net 909.14 ml   Filed Weights   11/01/19 0904 11/02/19 0500  Weight: 63.5 kg 67.9 kg    Examination:  General exam: Appears calm and comfortable  Respiratory system:  Clear to auscultation. Respiratory effort normal. No respiratory distress. No conversational dyspnea.  Cardiovascular system: S1 & S2 heard, RRR. No murmurs. No pedal edema. Gastrointestinal system: Abdomen is nondistended, soft and nontender. Normal bowel sounds heard. Central nervous system: Alert and oriented. No focal neurological deficits. Speech clear.  Extremities: Symmetric in appearance  Skin: No rashes, lesions or ulcers on exposed skin  Psychiatry: Judgement and insight appear normal. Mood & affect appropriate.   Data Reviewed:  I have personally reviewed following labs and imaging studies  CBC: Recent Labs  Lab 10/31/19 2235 11/01/19 0430 11/01/19 1914 11/02/19 0029 11/03/19 0538  WBC 11.9* 9.6 11.9* 11.9* 8.4  NEUTROABS  --   --   --  9.7*  --   HGB 12.4 11.0* 11.7* 12.0 10.3*  HCT 37.4 33.7* 36.6 37.0 32.1*  MCV 95.4 95.2 96.8 96.6 96.4  PLT 237 215 190 244 0000000   Basic Metabolic Panel: Recent Labs  Lab 10/31/19 2235 11/01/19 0430 11/01/19 1914 11/02/19 0029 11/03/19 0538  NA 140 141  --  138 136  K 3.2* 3.0*  --  3.8 4.1  CL 101 103  --  102 98  CO2 27 26  --  24 29  GLUCOSE 138* 114*  --  162* 124*  BUN 21 18  --  18 26*  CREATININE 0.59 0.51 0.72 0.75 0.79  CALCIUM 9.5 8.2*  --  8.7* 9.0  MG  --  1.3*  --  1.6*  --   PHOS  --   --   --  3.5  --    GFR: Estimated Creatinine Clearance: 48.1 mL/min (by C-G formula based on SCr of 0.79 mg/dL). Liver Function Tests: Recent Labs  Lab 10/31/19 2235 11/02/19 0029  AST 35 55*  ALT 28 37  ALKPHOS 64 58  BILITOT 0.8 1.4*  PROT 7.1 5.9*  ALBUMIN 3.8 3.4*   No results for input(s): LIPASE, AMYLASE in the last 168 hours. No results for input(s): AMMONIA in the last 168 hours. Coagulation Profile: No results for input(s): INR, PROTIME in the last 168 hours. Cardiac Enzymes: Recent Labs  Lab 10/31/19 2235 11/02/19 0029  CKTOTAL 272* 243*   BNP (last 3 results) No results for input(s): PROBNP in the last 8760 hours. HbA1C: No results for input(s): HGBA1C in the last 72 hours. CBG: No results for input(s): GLUCAP in the last 168 hours. Lipid Profile: No results for input(s): CHOL, HDL, LDLCALC, TRIG, CHOLHDL, LDLDIRECT in the last 72 hours. Thyroid Function Tests: No results for input(s): TSH, T4TOTAL, FREET4, T3FREE, THYROIDAB in the last 72 hours. Anemia Panel: No results for input(s): VITAMINB12, FOLATE, FERRITIN, TIBC, IRON, RETICCTPCT in the last 72 hours. Sepsis Labs: No results for input(s): PROCALCITON, LATICACIDVEN in the  last 168 hours.  Recent Results (from the past 240 hour(s))  SARS CORONAVIRUS 2 (TAT 6-24 HRS) Nasopharyngeal Nasopharyngeal Swab     Status: None   Collection Time: 10/31/19 11:25 PM   Specimen: Nasopharyngeal Swab  Result Value Ref Range Status   SARS Coronavirus 2 NEGATIVE NEGATIVE Final    Comment: (NOTE) SARS-CoV-2 target nucleic acids are NOT DETECTED. The SARS-CoV-2 RNA is generally detectable in upper and lower respiratory specimens during the acute phase of infection. Negative results do not preclude SARS-CoV-2 infection, do not rule out co-infections with other pathogens, and should not be used as the sole basis for treatment or other patient management decisions. Negative results must be combined with clinical observations, patient history, and epidemiological  information. The expected result is Negative. Fact Sheet for Patients: SugarRoll.be Fact Sheet for Healthcare Providers: https://www.woods-mathews.com/ This test is not yet approved or cleared by the [place] FDA and  has been authorized for detection and/or diagnosis of SARS-CoV-2 by FDA under an Emergency Use Authorization (EUA). This EUA will remain  in effect (meaning this test can be used) for the duration of the COVID-19 declaration under Section 56 4(b)(1) of the Act, 21 U.S.C. section 360bbb-3(b)(1), unless the authorization is terminated or revoked sooner. Performed at [facility], [place] [address], [place], [place] [address]   MRSA PCR Screening     Status: None   Collection Time: 11/01/19  9:26 PM   Specimen: Nasal Mucosa; Nasopharyngeal  Result Value Ref Range Status   MRSA by PCR NEGATIVE NEGATIVE Final    Comment:        The GeneXpert MRSA Assay (FDA approved for NASAL specimens only), is one component of a comprehensive MRSA colonization surveillance program. It is not intended to diagnose MRSA infection nor to guide or monitor treatment for  MRSA infections. Performed at [facility], [place] [address] [place], [place] [address]       Radiology Studies: DG Knee Left Port  Result Date: 11/01/2019 CLINICAL DATA:  Postop internal fixation EXAM: PORTABLE LEFT KNEE - 1-2 VIEW COMPARISON:  10/31/2019 FINDINGS: Plate and screw fixation across the oblique distal left femoral fracture. Near anatomic alignment. No complicating feature. IMPRESSION: Internal fixation.  No complicating feature. Electronically Signed   By: Rolm Baptise M.D.   On: 11/01/2019 19:09   DG C-Arm 1-60 Min  Result Date: 11/01/2019 CLINICAL DATA:  ORIF distal femur fracture EXAM: LEFT FEMUR 2 VIEWS; DG C-ARM 1-60 MIN COMPARISON:  None. FINDINGS: Plate and screw fixation across distal left femoral fracture. No hardware complicating feature. Near anatomic alignment. IMPRESSION: Internal fixation across the distal left femoral fracture. No complicating feature. Electronically Signed   By: Rolm Baptise M.D.   On: 11/01/2019 17:58   ECHOCARDIOGRAM COMPLETE  Result Date: 11/02/2019    ECHOCARDIOGRAM REPORT   Patient Name:   KYMIRA THERIEN Casebeer Date of Exam: 11/02/2019 Medical Rec #:  [id number]            Height:       66.0 in Accession #:    [id number]           Weight:       149.7 lb Date of Birth:  Dec 10, 1931           BSA:          1.768 m Patient Age:    86 years             BP:           85/52 mmHg Patient Gender: F                    HR:           81 bpm. Exam Location:  Inpatient Procedure: 2D Echo, 3D Echo, Cardiac Doppler and Color Doppler Indications:    R07.9* Chest pain, unspecified. Elevated troponin  History:        Patient has prior history of Echocardiogram examinations, most                 recent 05/03/2018. Abnormal ECG, Arrythmias:NSVT;                 Signs/Symptoms:Hypotension. History of breast cancer.  Sonographer:  [place] Referring Phys: [id number] [place]  1. False tendon in LV apex of no clinical significance. Left  ventricular ejection fraction, by estimation, is 40 to 45%. The left ventricle has mildly decreased function. The left ventricle demonstrates global hypokinesis. There is severe left ventricular hypertrophy of the basal-septal segment. Left ventricular diastolic parameters are consistent with Grade II diastolic dysfunction (pseudonormalization). Elevated left ventricular end-diastolic pressure.  2. Right ventricular systolic function is moderately reduced. The right ventricular size is mildly enlarged. There is moderately elevated pulmonary artery systolic pressure.  3. Right atrial size was severely dilated.  4. The mitral valve is normal in structure. Trivial mitral valve regurgitation. No evidence of mitral stenosis.  5. The aortic valve is tricuspid. Aortic valve regurgitation is mild. Mild to moderate aortic valve sclerosis/calcification is present, without any evidence of aortic stenosis.  6. Aortic dilatation noted. There is mild dilatation of the ascending aorta measuring 41 mm.  7. The inferior vena cava is dilated in size with <50% respiratory variability, suggesting right atrial pressure of 15 mmHg. FINDINGS  Left Ventricle: False tendon in LV apex of no clinical significance. Left ventricular ejection fraction, by estimation, is 40 to 45%. The left ventricle has mildly decreased function. The left ventricle demonstrates global hypokinesis. The left ventricular internal cavity size was normal in size. There is severe left ventricular hypertrophy of the basal-septal segment. Abnormal (paradoxical) septal motion, consistent with left bundle branch block. Left ventricular diastolic parameters are consistent with Grade II diastolic dysfunction (pseudonormalization). Elevated left ventricular end-diastolic pressure. Right Ventricle: The right ventricular size is mildly enlarged. No increase in right ventricular wall thickness. Right ventricular systolic function is moderately reduced. There is moderately  elevated pulmonary artery systolic pressure. The tricuspid regurgitant velocity is 2.63 m/s, and with an assumed right atrial pressure of 15 mmHg, the estimated right ventricular systolic pressure is 123456 mmHg. Left Atrium: Left atrial size was normal in size. Right Atrium: Right atrial size was severely dilated. Pericardium: There is no evidence of pericardial effusion. Mitral Valve: The mitral valve is normal in structure. Normal mobility of the mitral valve leaflets. Moderate mitral annular calcification. Trivial mitral valve regurgitation. No evidence of mitral valve stenosis. MV peak gradient, 8.1 mmHg. The mean mitral valve gradient is 2.0 mmHg. Tricuspid Valve: The tricuspid valve is normal in structure. Tricuspid valve regurgitation is mild . No evidence of tricuspid stenosis. Aortic Valve: The aortic valve is tricuspid. Aortic valve regurgitation is mild. Aortic regurgitation PHT measures 453 msec. Mild to moderate aortic valve sclerosis/calcification is present, without any evidence of aortic stenosis. Pulmonic Valve: The pulmonic valve was normal in structure. Pulmonic valve regurgitation is not visualized. No evidence of pulmonic stenosis. Aorta: The aortic root is normal in size and structure and aortic dilatation noted. There is mild dilatation of the ascending aorta measuring 41 mm. Venous: The inferior vena cava is dilated in size with less than 50% respiratory variability, suggesting right atrial pressure of 15 mmHg. IAS/Shunts: No atrial level shunt detected by color flow Doppler.  LEFT VENTRICLE PLAX 2D LVIDd:         3.60 cm     Diastology LVIDs:         2.55 cm     LV e' lateral:   4.90 cm/s LV PW:         1.60 cm     LV E/e' lateral: 26.2 LV IVS:        1.80 cm  LV e' medial:    9.68 cm/s LVOT diam:     1.80 cm     LV E/e' medial:  13.3 LV SV:         38 LV SV Index:   22 LVOT Area:     2.54 cm  LV Volumes (MOD) LV vol d, MOD A2C: 70.9 ml LV vol d, MOD A4C: 69.7 ml LV vol s, MOD A2C: 36.9  ml LV vol s, MOD A4C: 47.2 ml LV SV MOD A2C:     34.0 ml LV SV MOD A4C:     69.7 ml LV SV MOD BP:      29.3 ml IVC IVC diam: 2.50 cm LEFT ATRIUM             Index       RIGHT ATRIUM           Index LA diam:        3.60 cm 2.04 cm/m  RA Area:     22.40 cm LA Vol (A2C):   41.1 ml 23.25 ml/m RA Volume:   78.40 ml  44.34 ml/m LA Vol (A4C):   46.4 ml 26.24 ml/m LA Biplane Vol: 47.4 ml 26.81 ml/m  AORTIC VALVE LVOT Vmax:   93.30 cm/s LVOT Vmean:  64.700 cm/s LVOT VTI:    0.151 m AI PHT:      453 msec  AORTA Ao Root diam: 3.60 cm Ao Asc diam:  4.10 cm MITRAL VALVE                TRICUSPID VALVE MV Area (PHT): 6.43 cm     TR Peak grad:   27.7 mmHg MV Peak grad:  8.1 mmHg     TR Vmax:        263.00 cm/s MV Mean grad:  2.0 mmHg MV Vmax:       1.42 m/s     SHUNTS MV Vmean:      49.4 cm/s    Systemic VTI:  0.15 m MV Decel Time: 118 msec     Systemic Diam: 1.80 cm MV E velocity: 128.50 cm/s Fransico Him MD Electronically signed by Fransico Him MD Signature Date/Time: 11/02/2019/12:34:35 PM    Final    DG FEMUR MIN 2 VIEWS LEFT  Result Date: 11/01/2019 CLINICAL DATA:  ORIF distal femur fracture EXAM: LEFT FEMUR 2 VIEWS; DG C-ARM 1-60 MIN COMPARISON:  None. FINDINGS: Plate and screw fixation across distal left femoral fracture. No hardware complicating feature. Near anatomic alignment. IMPRESSION: Internal fixation across the distal left femoral fracture. No complicating feature. Electronically Signed   By: Rolm Baptise M.D.   On: 11/01/2019 17:58      Scheduled Meds: . acetaminophen  500 mg Oral Q8H  . Chlorhexidine Gluconate Cloth  6 each Topical Daily  . docusate sodium  100 mg Oral BID  . feeding supplement (ENSURE ENLIVE)  237 mL Oral BID BM  . mouth rinse  15 mL Mouth Rinse BID  . multivitamin with minerals  1 tablet Oral Daily  . nutrition supplement (JUVEN)  1 packet Oral BID BM  . simvastatin  20 mg Oral q1800   Continuous Infusions: . sodium chloride 250 mL (11/01/19 2105)  . norepinephrine  (LEVOPHED) Adult infusion Stopped (11/02/19 [id number])     LOS: 2 days      Time spent: 40 minutes   Dessa Phi, DO [facility] 11/03/2019, 9:41 AM   Available via Epic secure chat 7am-7pm After these hours, please refer  to coverage provider listed on [email or web address]

## 2019-11-03 NOTE — Progress Notes (Signed)
    Subjective: Patient reports pain as mild to moderate, controlled mainly with Tylenol.      Objective:   VITALS:   Vitals:   11/03/19 0800 11/03/19 0900 11/03/19 1000 11/03/19 1225  BP: (!) 108/59 (!) 94/56 103/60 104/67  Pulse: 91 86 84 86  Resp: (!) 27 19 19  (!) 22  Temp: 97.9 F (36.6 C)     TempSrc: Oral     SpO2: 92% 91% 95% 96%  Weight:      Height:       CBC Latest Ref Rng & Units 11/03/2019 11/02/2019 11/01/2019  WBC 4.0 - 10.5 K/uL 8.4 11.9(H) 11.9(H)  Hemoglobin 12.0 - 15.0 g/dL 10.3(L) 12.0 11.7(L)  Hematocrit 36.0 - 46.0 % 32.1(L) 37.0 36.6  Platelets 150 - 400 K/uL 160 244 190   BMP Latest Ref Rng & Units 11/03/2019 11/02/2019 11/01/2019  Glucose 70 - 99 mg/dL 124(H) 162(H) -  BUN 8 - 23 mg/dL 26(H) 18 -  Creatinine 0.44 - 1.00 mg/dL 0.79 0.75 0.72  BUN/Creat Ratio 12 - 28 - - -  Sodium 135 - 145 mmol/L 136 138 -  Potassium 3.5 - 5.1 mmol/L 4.1 3.8 -  Chloride 98 - 111 mmol/L 98 102 -  CO2 22 - 32 mmol/L 29 24 -  Calcium 8.9 - 10.3 mg/dL 9.0 8.7(L) -   Intake/Output      04/10 0701 - 04/11 0700 04/11 0701 - 04/12 0700   P.O. 1340 480   I.V. (mL/kg) 6.9 (0.1) 426 (6.3)   Other     IV Piggyback 654.1    Total Intake(mL/kg) 2001.1 (29.5) 906 (13.3)   Urine (mL/kg/hr) 860 (0.5) 175 (0.3)   Blood     Total Output 860 175   Net +1141.1 +731        Urine Occurrence  1 x      Physical Exam: General: NAD.  Upright in bed.  Calm, conversant.  Daughter at bedside. Resp: No increased wob  MSK LLE: Lateral Mepilex dressing intact with scant drainage. Feet warm Sensation intact distally Dorsiflexion/Plantar flexion, EHL/FHL intact Incision: dressing C/D/I   Assessment: 2 Days Post-Op  S/P Procedure(s) (LRB): OPEN REDUCTION INTERNAL FIXATION (ORIF) DISTAL FEMUR FRACTURE (Left) by Dr. Marcelino Scot on 11/01/2018   Principal Problem:   Closed fracture of distal end of left femur, initial encounter ([place]) Active Problems:   Hypokalemia   Pure  hypercholesterolemia   Essential hypertension, benign   Diastolic dysfunction without heart failure   Prolonged QT interval   Other fracture of left femur, initial encounter for closed fracture (HCC)   Hip fracture (HCC)   Postprocedural hypotension   Non-ST elevation (NSTEMI) myocardial infarction ([place])   Closed left distal femur fracture, status post ORIF Stable from an orthopedic perspective postop day 2 Pain controlled    Plan:  Up with therapy Incentive Spirometry Elevate and Apply ice  Weightbearing: TDWB LLE Insicional and dressing care: Mepilex as needed VTE prophylaxis: SCDs, ambulation.  Lovenox. Pain control: Tylenol.  Minimize narcotics. Follow - up plan: Dr. Marcelino Scot 10 to 14 days  Dispo: SNF planning started.  Discharge per medicine/cards when ready medically and mobilized.  Therapy evaluations ongoing.   Prudencio Burly III, PA-C 11/03/2019, 4:35 PM

## 2019-11-03 NOTE — TOC Initial Note (Signed)
Transition of Care [facility]) - Initial/Assessment Note    Patient Details  Name: [name] MRN: [id number] Date of Birth: [birth year]  Transition of Care [facility]) CM/SW Contact:    Oretha Milch, LCSW Phone Number: 11/03/2019, 12:08 PM  Clinical Narrative:  CSW spoke with patient regarding physical therapy recommendation for inpatient physical therapy at a skilled nursing facility [SNF].   Patient reports she has been in two different SNF prior, [facility] and [facility]. CSW notes patient reported [facility] would be her preference, but she would be open to referral to multiple facilities.  Patient reports she wants to build her strength up as she is at home alone most of the day. Patient reports her spouse is supportive but operates his own business. Patient reports she has a healthcare aid come to her home once a week to help around the house with things. Patient reports a recent fall due to carrying a bunch of groceries and making a misstep.  CSW received verbal consent to speak with spouse if needed and the reach out to facility contacts for referrals. CSW notes patient denies any history of depression or treatment of it and states she does not know why it is in her medical record. 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 Patient identifies her health as a significant motivator for her to remain sober.  CSW notes patient was initially apprehensive when informed of SNF recommendation. Patient did verbalize consent and stated "I'll do what I have to." CSW briefly reviewed her rights and notes she did want to go to a SNF to work on her strength. CSW has began the referral process and will follow-up when bed offers are made.           Expected Discharge Plan: Skilled Nursing Facility Barriers to Discharge: Insurance Authorization, SNF Pending bed offer   Patient Goals and CMS Choice Patient states their goals for this hospitalization and  ongoing recovery are:: "I want to be able to be independent again. I'm at home alone all day and I need to be able to take care of myself." CMS Medicare.gov Compare Post Acute Care list provided to:: Patient Choice offered to / list presented to : Patient  Expected Discharge Plan and Services Expected Discharge Plan: [place] In-house Referral: Clinical Social Work Discharge Planning Services: NA Post Acute Care Choice: [place] Living arrangements for the past 2 months: Single Family Home                 DME Arranged: N/A DME Agency: NA                  Prior Living Arrangements/Services Living arrangements for the past 2 months: Single Family Home Lives with:: Spouse Patient language and need for interpreter reviewed:: Yes Do you feel safe going back to the place where you live?: Yes      Need for Family Participation in Patient Care: No (Comment) Care giver support system in place?: Yes (comment) Current home services: DME Criminal Activity/Legal Involvement Pertinent to Current Situation/Hospitalization: No - Comment as needed  Activities of Daily Living Home Assistive Devices/Equipment: Eyeglasses, Cane (specify quad or straight)(single point cane) ADL Screening (condition at time of admission) Patient's cognitive ability adequate to safely complete daily activities?: Yes Is the patient deaf or have difficulty hearing?: No Does the patient have difficulty seeing, even when wearing glasses/contacts?: No Does the patient have difficulty concentrating, remembering, or making decisions?: No  Patient able to express need for assistance with ADLs?: Yes Does the patient have difficulty dressing or bathing?: Yes Independently performs ADLs?: No Communication: Independent Dressing (OT): Needs assistance Is this a change from baseline?: Change from baseline, expected to last >3 days Grooming: Needs assistance Is this a change from baseline?:  Change from baseline, expected to last >3 days Feeding: Needs assistance Is this a change from baseline?: Change from baseline, expected to last >3 days Bathing: Needs assistance Is this a change from baseline?: Change from baseline, expected to last >3 days Toileting: Dependent Is this a change from baseline?: Change from baseline, expected to last >3days In/Out Bed: Dependent Is this a change from baseline?: Change from baseline, expected to last >3 days Walks in Home: Dependent Is this a change from baseline?: Change from baseline, expected to last >3 days Does the patient have difficulty walking or climbing stairs?: Yes Weakness of Legs: Left Weakness of Arms/Hands: None  Permission Sought/Granted Permission sought to share information with : Facility Sport and exercise psychologist, Family Supports Permission granted to share information with : Yes, Verbal Permission Granted  Share Information with NAME: Jalia Zielinski, spouse, [phone]  Permission granted to share info w AGENCY: Facility contacts for SNF referral process        Emotional Assessment Appearance:: Appears stated age, Developmentally appropriate Attitude/Demeanor/Rapport: Engaged, Charismatic Affect (typically observed): Accepting, Adaptable Orientation: : Oriented to Self, Oriented to Place, Oriented to  Time, Oriented to Situation Alcohol / Substance Use: Not Applicable Psych Involvement: No (comment)  Admission diagnosis:  Fall [W19.XXXA] Other fracture of left femur, initial encounter for closed fracture ([place]) [S72.8X2A] Closed fracture of distal end of femur, unspecified fracture morphology, initial encounter ([place]) [S72.409A] Hip fracture ([place]) [S72.009A] Patient Active Problem List   Diagnosis Date Noted  . Closed fracture of distal end of left femur, initial encounter ([place]) 11/01/2019  . Other fracture of left femur, initial encounter for closed fracture ([place]) 11/01/2019  . Hip fracture ([place]) 11/01/2019  .  Postprocedural hypotension   . NSVT (nonsustained ventricular tachycardia) ([place]) 09/26/2018  . Bradycardia   . Displaced supracondylar fracture of distal end of right femur without intracondylar extension ([place]) 05/04/2018  . Postop check   . Pain   . Femur fracture, right ([place]) 05/02/2018  . Prolonged QT interval 05/02/2018  . [place] 05/02/2018  . Left ventricular diastolic dysfunction with preserved systolic function [id number]  . PAT (paroxysmal atrial tachycardia) ([place]) 03/24/2016  . Diastolic dysfunction without heart failure 03/24/2016  . Peripheral venous insufficiency 03/24/2016  . Closed wedge compression fracture of fourth lumbar vertebra ([place])   . Near syncope 10/20/2014  . Orthostatic hypotension 10/20/2014  . Fatigue 10/19/2014  . Syncope 10/19/2014  . Generalized weakness   . Elevated troponin   . Dyspnea on exertion   . Rib fracture 09/05/2014  . HX: breast cancer 09/26/2013  . Depression 11/20/2012  . Unspecified constipation 11/20/2012  . Hypercholesterolemia 11/20/2012  . Essential hypertension, benign 11/20/2012  . Postoperative anemia due to acute blood loss 11/01/2012  . Hyponatremia 11/01/2012  . Hypokalemia 11/01/2012  . OA (osteoarthritis) of knee 10/29/2012  . Septic joint of right knee joint ([place]) 05/23/2012  . Elevated liver enzymes 05/23/2012   PCP:  Burnard Bunting, MD Pharmacy:   [facility] - [address] [place] [address] Phone: [phone] Fax: [phone]  [id number] - [place], [place]  [id number] Phone: [phone] Fax: [phone]     Social Determinants of Health (SDOH) Interventions    Readmission Risk Interventions No flowsheet data found.

## 2019-11-03 NOTE — Consult Note (Addendum)
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 the distal femoral metadiaphysis. There is approximately 3 cm medial displacement of the distal fracture fragment and posterior angulation. There is advanced osteopenia. Severe arthritic changes of the left knee with tricompartmental narrowing most pronounced involving the lateral compartment. There is no dislocation. There is a small suprapatellar effusion. The soft tissues are unremarkable. Vascular calcifications noted. IMPRESSION: 1. Displaced and angulated oblique fracture of the distal left femur. No dislocation. 2. Osteopenia and severe osteoarthritic changes of the left knee. Electronically Signed   By: Anner Crete M.D.   On: 10/31/2019 23:28       TIMI Risk Score for Unstable Angina or Non-ST Elevation MI:   The patient's TIMI risk score is  3, which indicates a 13% risk of all cause mortality, new or recurrent myocardial infarction or need for urgent revascularization in the next 14 days.    Assessment and Plan:   1. Mechanical fall with hip fracture: s/p ORIF. Patient  is clear that this was not syncope and that she tripped and never lost consciousness. She was down for a prolonged period of time (5 hours). CK only mildly elevated at 276.  2. NSTEMI: HS troponin: 3,180--> 3,672--> 3,375. She has not had any chest pain. Plan for lexiscan myoview to rule out high risk ischemia tomorrow morning. NPO after midnight.   3. Biventricular dysfunction: TTE showed EF 40-45% with global hypokinesis, severe LVH of the basal septal segment, G2DD with elevated LVEDP, moderately reduced RV systolic function with mild right ventricular enlargement.  ? Stress induced cardiomyopathy. She has not been on BBs in the past given baseline conduction abnormalities. Consider starting an ARB if BP improves.    For questions or updates, please contact [place] Please consult [email or web address] for contact info under     Signed, Angelena Form, PA-C  11/03/2019 1:05 PM

## 2019-11-03 NOTE — NC FL2 (Signed)
[place] LEVEL OF CARE SCREENING TOOL     IDENTIFICATION  Patient Name: [name] Birthdate: 1931/10/28 Sex: female Admission Date (Current Location): 10/31/2019  [facility] and [place] Number:  Herbalist and Address:  [facility] [place], [place] [address], [place], [place] [address]      Provider Number: [id number]  Attending Physician Name and Address:  Dessa Phi, DO  Relative Name and Phone Number:  [name], spouse, [phone]    Current Level of Care: Hospital Recommended Level of Care: [place] Prior Approval Number:    Date Approved/Denied:   PASRR Number: [id number]  Discharge Plan: SNF    Current Diagnoses: Patient Active Problem List   Diagnosis Date Noted  . Closed fracture of distal end of left femur, initial encounter ([place]) 11/01/2019  . Other fracture of left femur, initial encounter for closed fracture ([place]) 11/01/2019  . Hip fracture ([place]) 11/01/2019  . Postprocedural hypotension   . NSVT (nonsustained ventricular tachycardia) ([place]) 09/26/2018  . Bradycardia   . Displaced supracondylar fracture of distal end of right femur without intracondylar extension ([place]) 05/04/2018  . Postop check   . Pain   . Femur fracture, right ([place]) 05/02/2018  . Prolonged QT interval 05/02/2018  . [place] 05/02/2018  . Left ventricular diastolic dysfunction with preserved systolic function [id number]  . PAT (paroxysmal atrial tachycardia) ([place]) 03/24/2016  . Diastolic dysfunction without heart failure 03/24/2016  . Peripheral venous insufficiency 03/24/2016  . Closed wedge compression fracture of fourth lumbar vertebra ([place])   . Near syncope 10/20/2014  . Orthostatic hypotension 10/20/2014  . Fatigue 10/19/2014  . Syncope 10/19/2014  . Generalized weakness   . Elevated troponin   . Dyspnea on exertion   . Rib fracture 09/05/2014  . HX: breast cancer 09/26/2013  . Depression 11/20/2012  .  Unspecified constipation 11/20/2012  . Hypercholesterolemia 11/20/2012  . Essential hypertension, benign 11/20/2012  . Postoperative anemia due to acute blood loss 11/01/2012  . Hyponatremia 11/01/2012  . Hypokalemia 11/01/2012  . OA (osteoarthritis) of knee 10/29/2012  . Septic joint of right knee joint ([place]) 05/23/2012  . Elevated liver enzymes 05/23/2012    Orientation RESPIRATION BLADDER Height & Weight     Self, Time, Situation, Place  Normal   Weight: 149 lb 11.1 oz (67.9 kg) Height:  5\' 6"  (167.6 cm)  BEHAVIORAL SYMPTOMS/MOOD NEUROLOGICAL BOWEL NUTRITION STATUS  Other (Comment)(Patient denies)     Diet(Heart)  AMBULATORY STATUS COMMUNICATION OF NEEDS Skin   Limited Assist Verbally Normal                       Personal Care Assistance Level of Assistance  Bathing, Feeding, Dressing Bathing Assistance: Limited assistance Feeding assistance: Independent Dressing Assistance: Limited assistance     Functional Limitations Info             SPECIAL CARE FACTORS FREQUENCY  PT (By licensed PT), OT (By licensed OT)     PT Frequency: 5x weekly OT Frequency: 5x weekly            Contractures Contractures Info: Not present    Additional Factors Info  Code Status, Allergies Code Status Info: Full Allergies Info: Codeine           Current Medications (11/03/2019):  This is the current hospital active medication list Current Facility-Administered Medications  Medication Dose Route Frequency Provider Last Rate Last Admin  . 0.9 %  sodium chloride infusion  250 mL Intravenous Continuous Gleason, Otilio Carpen, PA-C 10 mL/hr at 11/01/19 2105 250 mL at 11/01/19 2105  . acetaminophen (TYLENOL) tablet 325-650 mg  325-650 mg Oral Q6H PRN Ainsley Spinner, PA-C      . Chlorhexidine Gluconate Cloth 2 % PADS 6 each  6 each Topical Daily Maryjane Hurter, MD   6 each at 11/03/19 1027  . docusate sodium (COLACE) capsule 100 mg  100 mg Oral BID PRN Gleason, Otilio Carpen, PA-C   100 mg  at 11/03/19 1027  . enoxaparin (LOVENOX) injection 40 mg  40 mg Subcutaneous Q24H Dessa Phi, DO   40 mg at 11/03/19 1027  . feeding supplement (ENSURE ENLIVE) (ENSURE ENLIVE) liquid 237 mL  237 mL Oral BID BM Margaretha Seeds, MD      . HYDROcodone-acetaminophen (NORCO/VICODIN) 5-325 MG per tablet 1-2 tablet  1-2 tablet Oral Q4H PRN Ainsley Spinner, PA-C   2 tablet at 11/03/19 1026  . MEDLINE mouth rinse  15 mL Mouth Rinse BID Frederik Pear, MD   15 mL at 11/03/19 1027  . metoCLOPramide (REGLAN) tablet 5-10 mg  5-10 mg Oral Q8H PRN Ainsley Spinner, PA-C       Or  . metoCLOPramide (REGLAN) injection 5-10 mg  5-10 mg Intravenous Q8H PRN Ainsley Spinner, PA-C      . morphine 2 MG/ML injection 0.5-1 mg  0.5-1 mg Intravenous Q2H PRN Ainsley Spinner, PA-C      . multivitamin with minerals tablet 1 tablet  1 tablet Oral Daily Margaretha Seeds, MD   1 tablet at 11/03/19 1026  . nutrition supplement (JUVEN) (JUVEN) powder packet 1 packet  1 packet Oral BID BM Margaretha Seeds, MD   1 packet at 11/03/19 1027  . polyethylene glycol (MIRALAX / GLYCOLAX) packet 17 g  17 g Oral Daily PRN Ainsley Spinner, PA-C      . simvastatin (ZOCOR) tablet 20 mg  20 mg Oral q1800 Ainsley Spinner, PA-C   20 mg at 11/02/19 [id number]     Discharge Medications: Please see discharge summary for a list of discharge medications.  Relevant Imaging Results:  Relevant Lab Results:   Additional [place], LCSW

## 2019-11-04 ENCOUNTER — Inpatient Hospital Stay (HOSPITAL_COMMUNITY): Payer: Medicare Other

## 2019-11-04 DIAGNOSIS — R778 Other specified abnormalities of plasma proteins: Secondary | ICD-10-CM

## 2019-11-04 DIAGNOSIS — I255 Ischemic cardiomyopathy: Secondary | ICD-10-CM

## 2019-11-04 DIAGNOSIS — I429 Cardiomyopathy, unspecified: Secondary | ICD-10-CM

## 2019-11-04 LAB — BASIC METABOLIC PANEL
Anion gap: 12 (ref 5–15)
BUN: 29 mg/dL — ABNORMAL HIGH (ref 8–23)
CO2: 25 mmol/L (ref 22–32)
Calcium: 8.7 mg/dL — ABNORMAL LOW (ref 8.9–10.3)
Chloride: 99 mmol/L (ref 98–111)
Creatinine, Ser: 0.64 mg/dL (ref 0.44–1.00)
GFR calc Af Amer: >60 mL/min (ref >60)
GFR calc non Af Amer: >60 mL/min (ref >60)
Glucose, Bld: 112 mg/dL — ABNORMAL HIGH (ref 70–99)
Potassium: 3.8 mmol/L (ref 3.5–5.1)
Sodium: 136 mmol/L (ref 135–145)

## 2019-11-04 LAB — CBC
HCT: 31.7 % — ABNORMAL LOW (ref 36.0–46.0)
Hemoglobin: 10.2 g/dL — ABNORMAL LOW (ref 12.0–15.0)
MCH: 30.9 pg (ref 26.0–34.0)
MCHC: 32.2 g/dL (ref 30.0–36.0)
MCV: 96.1 fL (ref 80.0–100.0)
Platelets: 169 10*3/uL (ref 150–400)
RBC: 3.3 MIL/uL — ABNORMAL LOW (ref 3.87–5.11)
RDW: 13.2 % (ref 11.5–15.5)
WBC: 7.2 10*3/uL (ref 4.0–10.5)
nRBC: 0 % (ref 0.0–0.2)

## 2019-11-04 LAB — LIPID PANEL
Cholesterol: 133 mg/dL (ref 0–200)
HDL: 44 mg/dL (ref >40)
LDL Cholesterol: 69 mg/dL (ref 0–99)
Total CHOL/HDL Ratio: 3 RATIO
Triglycerides: 102 mg/dL (ref <150)
VLDL: 20 mg/dL (ref 0–40)

## 2019-11-04 LAB — HEMOGLOBIN A1C
Hgb A1c MFr Bld: 5.9 % — ABNORMAL HIGH (ref 4.8–5.6)
Mean Plasma Glucose: 123 mg/dL

## 2019-11-04 MED ORDER — TECHNETIUM TC 99M TETROFOSMIN IV KIT
10.3000 | PACK | Freq: Once | INTRAVENOUS | Status: AC | PRN
  Administered 2019-11-04: 10.3 via INTRAVENOUS

## 2019-11-04 MED ORDER — REGADENOSON 0.4 MG/5ML IV SOLN
0.4000 mg | Freq: Once | INTRAVENOUS | Status: AC
  Administered 2019-11-04: 0.4 mg via INTRAVENOUS
  Filled 2019-11-04: qty 5

## 2019-11-04 MED ORDER — TECHNETIUM TC 99M TETROFOSMIN IV KIT
30.6000 | PACK | Freq: Once | INTRAVENOUS | Status: AC | PRN
  Administered 2019-11-04: 30.6 via INTRAVENOUS

## 2019-11-04 MED ORDER — REGADENOSON 0.4 MG/5ML IV SOLN
INTRAVENOUS | Status: AC
  Filled 2019-11-04: qty 5

## 2019-11-04 MED ORDER — HYDROCODONE-ACETAMINOPHEN 5-325 MG PO TABS
ORAL_TABLET | ORAL | Status: AC
  Administered 2019-11-04: 1 via ORAL
  Filled 2019-11-04: qty 1

## 2019-11-04 NOTE — Progress Notes (Addendum)
Progress Note  Patient Name: [name] Date of Encounter: 11/04/2019  Primary Cardiologist: Sanda Klein, MD  Subjective   No CP or SOB reported. Still having hip pain - nuc med table was uncomfortable.   Inpatient Medications    Scheduled Meds: . Chlorhexidine Gluconate Cloth  6 each Topical Daily  . enoxaparin (LOVENOX) injection  40 mg Subcutaneous Q24H  . feeding supplement (ENSURE ENLIVE)  237 mL Oral BID BM  . mouth rinse  15 mL Mouth Rinse BID  . multivitamin with minerals  1 tablet Oral Daily  . nutrition supplement (JUVEN)  1 packet Oral BID BM  . rosuvastatin  20 mg Oral q1800   Continuous Infusions: . sodium chloride 250 mL (11/01/19 2105)   PRN Meds: acetaminophen, docusate sodium, HYDROcodone-acetaminophen, metoCLOPramide **OR** metoCLOPramide (REGLAN) injection, morphine injection, polyethylene glycol, technetium tetrofosmin   Vital Signs    Vitals:   11/03/19 1225 11/03/19 2011 11/04/19 0408 11/04/19 0825  BP: 104/67 110/60 (!) 95/57 (!) 107/56  Pulse: 86 87 83 83  Resp: (!) 22 15 16 17   Temp:  98 F (36.7 C) 98.2 F (36.8 C) 98.8 F (37.1 C)  TempSrc:  Oral Oral Oral  SpO2: 96% (!) 80% 91% 94%  Weight:      Height:        Intake/Output Summary (Last 24 hours) at 11/04/2019 1009 Last data filed at 11/03/2019 1702 Gross per 24 hour  Intake 906 ml  Output --  Net 906 ml   Last 3 Weights 11/02/2019 11/01/2019 07/17/2019  Weight (lbs) 149 lb 11.1 oz 140 lb 140 lb  Weight (kg) 67.9 kg 63.504 kg 63.504 kg     Telemetry    NSR with intermittent PVCs and competing atrial pacemaker - reviewed EKG tracing with Dr. Harrell Gave - Personally Reviewed  Physical Exam   GEN: No acute distress, frail appearing WF HEENT: Normocephalic, atraumatic, sclera non-icteric. Neck: No JVD or bruits. Cardiac: RRR no murmurs, rubs, or gallops.  Radials/DP/PT 1+ and equal bilaterally.  Respiratory: Clear to auscultation bilaterally. Breathing is  unlabored. GI: Soft, nontender, non-distended, BS +x 4. MS: no deformity. Extremities: No clubbing or cyanosis. No edema. Distal pedal pulses are 2+ and equal bilaterally. Neuro:  AAOx3. Follows commands. Psych:  Responds to questions appropriately with a normal affect.  Labs    High Sensitivity Troponin:   Recent Labs  Lab 11/01/19 1914 11/01/19 2149 11/02/19 0029  TROPONINIHS 3,180* 3,672* 3,375*      Cardiac EnzymesNo results for input(s): TROPONINI in the last 168 hours. No results for input(s): TROPIPOC in the last 168 hours.   Chemistry Recent Labs  Lab 10/31/19 2235 11/01/19 0430 11/02/19 0029 11/03/19 0538 11/04/19 0439  NA 140   < > 138 136 136  K 3.2*   < > 3.8 4.1 3.8  CL 101   < > 102 98 99  CO2 27   < > 24 29 25   GLUCOSE 138*   < > 162* 124* 112*  BUN 21   < > 18 26* 29*  CREATININE 0.59   < > 0.75 0.79 0.64  CALCIUM 9.5   < > 8.7* 9.0 8.7*  PROT 7.1  --  5.9*  --   --   ALBUMIN 3.8  --  3.4*  --   --   AST 35  --  55*  --   --   ALT 28  --  37  --   --   ALKPHOS 64  --  58  --   --   BILITOT 0.8  --  1.4*  --   --   GFRNONAA >60   < > >60 >60 >60  GFRAA >60   < > >60 >60 >60  ANIONGAP 12   < > 12 9 12    < > = values in this interval not displayed.     Hematology Recent Labs  Lab 11/02/19 0029 11/03/19 0538 11/04/19 0439  WBC 11.9* 8.4 7.2  RBC 3.83* 3.33* 3.30*  HGB 12.0 10.3* 10.2*  HCT 37.0 32.1* 31.7*  MCV 96.6 96.4 96.1  MCH 31.3 30.9 30.9  MCHC 32.4 32.1 32.2  RDW 13.2 13.3 13.2  PLT 244 160 169    BNPNo results for input(s): BNP, PROBNP in the last 168 hours.   DDimer No results for input(s): DDIMER in the last 168 hours.   Radiology    No results found.  Cardiac Studies   2D echo 11/02/19  1. False tendon in LV apex of no clinical significance. Left ventricular  ejection fraction, by estimation, is 40 to 45%. The left ventricle has  mildly decreased function. The left ventricle demonstrates global  hypokinesis. There  is severe left  ventricular hypertrophy of the basal-septal segment. Left ventricular  diastolic parameters are consistent with Grade II diastolic dysfunction  (pseudonormalization). Elevated left ventricular end-diastolic pressure.  2. Right ventricular systolic function is moderately reduced. The right  ventricular size is mildly enlarged. There is moderately elevated  pulmonary artery systolic pressure.  3. Right atrial size was severely dilated.  4. The mitral valve is normal in structure. Trivial mitral valve  regurgitation. No evidence of mitral stenosis.  5. The aortic valve is tricuspid. Aortic valve regurgitation is mild.  Mild to moderate aortic valve sclerosis/calcification is present, without  any evidence of aortic stenosis.  6. Aortic dilatation noted. There is mild dilatation of the ascending  aorta measuring 41 mm.  7. The inferior vena cava is dilated in size with <50% respiratory  variability, suggesting right atrial pressure of 15 mmHg.   Patient Profile     86 y.o. 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 She was admitted 10/2019 with fall while carrying in groceries. She was on the ground for 5 hours (CK 272). She was found to have distal femur fracture that required ORIF. Post-op course notable for hypotension requiring pressors and IV fluids. This appears to have been felt related to hypovolemia, vasodilatory state, sedation. Her troponin was also noted to be elevated 3,180--> 3,672--> 3,375. TTE showed EF 40-45% with global hypokinesis, severe LVH of the basal septal segment, G2 DD with elevated LVEDP, moderately reduced RV systolic function with mild right ventricular enlargement,  mild to moderate aortic valve sclerosis without aortic stenosis and mild dilatation of the ascending aorta at 41 mm. Cardiology consulted for elevated troponin.  Assessment & Plan    1. Mechanical fall with hip fracture - s/p ORIF. Patient was clear this was mechanical fall and not related to syncope. Prior to admission sounded quite functional.  2. Elevated troponin - possible NSTEMI versus demand ischemia - hsTroponin peaked at 3672. 2D echo showed new LV dysfunction compared to prior. Nuclear stress test was ordered, results pending. First IV did infiltrate, see below. Simvastatin was switched to rosuvastatin  this admission. If the patient is tolerating statin at time of follow-up appointment, would consider rechecking liver function/lipid panel in 6-8 weeks. Start ASA 81mg  when OK with surgery.  3. New cardiomyopathy - echo result as above. Etiology not clear, question stress induced from surgery vs related to underlying ischemia. Not on BB given h/o sinus bradycardia and baseline conduction abnormalities. BP on the softer side so would hold off on addition of ACEI/ARB for now. Follow volume status post-operatively. Appears euvolemic.   4. H/o PAT, PVCs, NSVT - also treated for low K, Mg this admission by primary teams.  5. Mild ascending aortic aneurysm - 4.1cm. Will need OP management as dictated by clinical status.  6. ? O2 requirement - still on Ec[place]ic O2 post-operatively. Will need trial of weaning at direction of primary team to assess for discharge needs - if persistent requirement may need exclusion of PE.  7. IV infiltration - first left arm IV infiltrated during study with some of the Lexiscan solution into the surrounding tissue. Small area of swelling noted to dorsum of hand/left forearm. This will need to be managed conservatively. Watch for any additive systemic effects. (Aminophylline would be antidote for any issues.) Ice/compression was applied in nuc med. Per discussion with Dr.  Harrell Gave would plan warm compresses and elevation moving forward for comfort with close monitoring of vitals throughout the day. I have checked in with Ms. Bertram Millard several times since her procedure and she is doing well with stable HR and BP. She feels much better after having received pain medication for her hip. I have also kept nurse informed as well.  For questions or updates, please contact [place] Please consult [email or web address] for contact info under Cardiology/STEMI.  Signed, Charlie Pitter, PA-C 11/04/2019, 10:09 AM

## 2019-11-04 NOTE — Progress Notes (Signed)
PROGRESS NOTE    [name]  [id number] DOB: 1932/06/28 DOA: 10/31/2019 PCP: Burnard Bunting, MD     Brief Narrative:  [name] is an 86 yo female with past medical history significant for HTN, NSVT, PVC, diastolic heart failure, history of left breast cancer, depression, osteoporosis, osteoarthritis who presented to the hospital with left leg pain. She had been grocery shopping, tripped on a rug and fell to the ground.  In the emergency department, she was found to have distal left femur fracture.  She was transferred from [facility] to [facility] for surgical intervention.  Postoperatively, she developed hypotension and was transferred to the ICU for pressors. BP improved and patient transferred to telemetry and under [facility] service on 4/11.   New events last 24 hours / Subjective: Denies any chest pain or worsening pain in left leg today. Awaiting stress test.  Assessment & Plan:   Principal Problem:   Closed fracture of distal end of left femur, initial encounter ([place]) Active Problems:   Hypokalemia   Pure hypercholesterolemia   Essential hypertension, benign   Diastolic dysfunction without heart failure   Prolonged QT interval   Other fracture of left femur, initial encounter for closed fracture [facility])   Hip fracture (HCC)   Postprocedural hypotension   Non-ST elevation (NSTEMI) myocardial infarction [facility])   Distal left femur fracture -Status post ORIF by Dr. Marcelino Scot 4/9 -Pain control -PT recommends SNF placement  NSTEMI -Peak troponin 3672 -Echocardiogram revealed EF 40 to 45% with global hypokinesis, severe left ventricular hypertrophy, grade 2 diastolic dysfunction -Cardiology consulted, stress test pending today   Postop hypotension -Thought to be sedation related, now off pressor and stable blood pressure 107/56   Essential hypertension -Hold home Maxide  Chronic diastolic heart failure -Does not appear to be fluid overloaded on  exam   HLD -Continue crestor     DVT prophylaxis: Lovenox Code Status: Full Family Communication: None at bedside  Disposition Plan:  Status is: Inpatient  Remains inpatient appropriate because:Ongoing diagnostic testing needed not appropriate for outpatient work up   Dispo: The patient is from: Home              Anticipated d/c is to: SNF              Anticipated d/c date is: 1 day              Patient currently is not medically stable to d/c.        Consultants:   Orthopedic surgery  PCCM  Cardiology   Antimicrobials:  Anti-infectives (From admission, onward)   Start     Dose/Rate Route Frequency Ordered Stop   11/01/19 2100  ceFAZolin (ANCEF) IVPB 1 g/50 mL premix     1 g 100 mL/hr over 30 Minutes Intravenous Every 6 hours 11/01/19 2037 11/02/19 1646   11/01/19 1145  ceFAZolin (ANCEF) IVPB 2g/100 mL premix     2 g 200 mL/hr over 30 Minutes Intravenous On call to O.R. 11/01/19 1141 11/01/19 1533       Objective: Vitals:   11/03/19 1225 11/03/19 2011 11/04/19 0408 11/04/19 0825  BP: 104/67 110/60 (!) 95/57 (!) 107/56  Pulse: 86 87 83 83  Resp: (!) 22 15 16 17   Temp:  98 F (36.7 C) 98.2 F (36.8 C) 98.8 F (37.1 C)  TempSrc:  Oral Oral Oral  SpO2: 96% (!) 80% 91% 94%  Weight:      Height:  Intake/Output Summary (Last 24 hours) at 11/04/2019 1045 Last data filed at 11/03/2019 1702 Gross per 24 hour  Intake 906 ml  Output --  Net 906 ml   Filed Weights   11/01/19 0904 11/02/19 0500  Weight: 63.5 kg 67.9 kg    Examination: General exam: Appears calm and comfortable  Respiratory system: Clear to auscultation. Respiratory effort normal. Cardiovascular system: S1 & S2 heard, RRR. No pedal edema. Gastrointestinal system: Abdomen is nondistended, soft and nontender. Normal bowel sounds heard. Central nervous system: Alert and oriented. Non focal exam. Speech clear  Extremities: Symmetric in appearance bilaterally  Skin: No rashes,  lesions or ulcers on exposed skin  Psychiatry: Judgement and insight appear stable. Mood & affect appropriate.    Data Reviewed: I have personally reviewed following labs and imaging studies  CBC: Recent Labs  Lab 11/01/19 0430 11/01/19 1914 11/02/19 0029 11/03/19 0538 11/04/19 0439  WBC 9.6 11.9* 11.9* 8.4 7.2  NEUTROABS  --   --  9.7*  --   --   HGB 11.0* 11.7* 12.0 10.3* 10.2*  HCT 33.7* 36.6 37.0 32.1* 31.7*  MCV 95.2 96.8 96.6 96.4 96.1  PLT 215 190 244 160 [id number]   Basic Metabolic Panel: Recent Labs  Lab 10/31/19 2235 10/31/19 2235 11/01/19 0430 11/01/19 1914 11/02/19 0029 11/03/19 0538 11/04/19 0439  NA 140  --  141  --  138 136 136  K 3.2*  --  3.0*  --  3.8 4.1 3.8  CL 101  --  103  --  102 98 99  CO2 27  --  26  --  24 29 25   GLUCOSE 138*  --  114*  --  162* 124* 112*  BUN 21  --  18  --  18 26* 29*  CREATININE 0.59   < > 0.51 0.72 0.75 0.79 0.64  CALCIUM 9.5  --  8.2*  --  8.7* 9.0 8.7*  MG  --   --  1.3*  --  1.6*  --   --   PHOS  --   --   --   --  3.5  --   --    < > = values in this interval not displayed.   GFR: Estimated Creatinine Clearance: 48.1 mL/min (by C-G formula based on SCr of 0.64 mg/dL). Liver Function Tests: Recent Labs  Lab 10/31/19 2235 11/02/19 0029  AST 35 55*  ALT 28 37  ALKPHOS 64 58  BILITOT 0.8 1.4*  PROT 7.1 5.9*  ALBUMIN 3.8 3.4*   No results for input(s): LIPASE, AMYLASE in the last 168 hours. No results for input(s): AMMONIA in the last 168 hours. Coagulation Profile: No results for input(s): INR, PROTIME in the last 168 hours. Cardiac Enzymes: Recent Labs  Lab 10/31/19 2235 11/02/19 0029  CKTOTAL 272* 243*   BNP (last 3 results) No results for input(s): PROBNP in the last 8760 hours. HbA1C: Recent Labs    11/02/19 0029  HGBA1C 5.9*   CBG: No results for input(s): GLUCAP in the last 168 hours. Lipid Profile: Recent Labs    11/04/19 0439  CHOL 133  HDL 44  LDLCALC 69  TRIG 102  CHOLHDL 3.0    Thyroid Function Tests: No results for input(s): TSH, T4TOTAL, FREET4, T3FREE, THYROIDAB in the last 72 hours. Anemia Panel: No results for input(s): VITAMINB12, FOLATE, FERRITIN, TIBC, IRON, RETICCTPCT in the last 72 hours. Sepsis Labs: No results for input(s): PROCALCITON, LATICACIDVEN in the last 168 hours.  Recent  Results (from the past 240 hour(s))  SARS CORONAVIRUS 2 (TAT 6-24 HRS) Nasopharyngeal Nasopharyngeal Swab     Status: None   Collection Time: 10/31/19 11:25 PM   Specimen: Nasopharyngeal Swab  Result Value Ref Range Status   SARS Coronavirus 2 NEGATIVE NEGATIVE Final    Comment: (NOTE) SARS-CoV-2 target nucleic acids are NOT DETECTED. The SARS-CoV-2 RNA is generally detectable in upper and lower respiratory specimens during the acute phase of infection. Negative results do not preclude SARS-CoV-2 infection, do not rule out co-infections with other pathogens, and should not be used as the sole basis for treatment or other patient management decisions. Negative results must be combined with clinical observations, patient history, and epidemiological information. The expected result is Negative. Fact Sheet for Patients: SugarRoll.be Fact Sheet for Healthcare Providers: https://www.woods-mathews.com/ This test is not yet approved or cleared by the [place] FDA and  has been authorized for detection and/or diagnosis of SARS-CoV-2 by FDA under an Emergency Use Authorization (EUA). This EUA will remain  in effect (meaning this test can be used) for the duration of the COVID-19 declaration under Section 56 4(b)(1) of the Act, 21 U.S.C. section 360bbb-3(b)(1), unless the authorization is terminated or revoked sooner. Performed at [facility], [place] [address] [place], [place] [address]   MRSA PCR Screening     Status: None   Collection Time: 11/01/19  9:26 PM   Specimen: Nasal Mucosa; Nasopharyngeal  Result Value Ref  Range Status   MRSA by PCR NEGATIVE NEGATIVE Final    Comment:        The GeneXpert MRSA Assay (FDA approved for NASAL specimens only), is one component of a comprehensive MRSA colonization surveillance program. It is not intended to diagnose MRSA infection nor to guide or monitor treatment for MRSA infections. Performed at [facility], [place] [address] [place], [place] [address]       Radiology Studies: No results found.    Scheduled Meds: . Chlorhexidine Gluconate Cloth  6 each Topical Daily  . enoxaparin (LOVENOX) injection  40 mg Subcutaneous Q24H  . feeding supplement (ENSURE ENLIVE)  237 mL Oral BID BM  . mouth rinse  15 mL Mouth Rinse BID  . multivitamin with minerals  1 tablet Oral Daily  . nutrition supplement (JUVEN)  1 packet Oral BID BM  . regadenoson      . regadenoson  0.4 mg Intravenous Once  . rosuvastatin  20 mg Oral q1800   Continuous Infusions: . sodium chloride 250 mL (11/01/19 2105)     LOS: 3 days      Time spent: 25 minutes   Dessa Phi, DO [facility] 11/04/2019, 10:45 AM   Available via Epic secure chat 7am-7pm After these hours, please refer to coverage provider listed on [email or web address]

## 2019-11-04 NOTE — Progress Notes (Addendum)
Nuclear stress test result is not back yet. It was allocated for [facility] Radiology to read today. I spoke with their team who indicated since it still says Exam Begun, the nuc med technologist will need to finish out the processing on their end so that it can push the study to the radiology department to read. Cardiac nuc med team is gone for the day so our team will need to f/u in AM to complete. I will send message to our rounding team tomorrow to complete.  Addendum: also checked on patient's vitals this evening again, pulse stable in the 90s, rhythm NSR with occasional PACs/PVCs, no new changes, BP 118/70, clinically stable from cardiac standpoint and feeling well without any symptoms. I had spoken with Dr. Maylene Roes earlier today regarding notation that patient has continued to require O2 for the last few days. (The patient has appeared euvolemic on exam and has not complained of any SOB.) Dr. Maylene Roes felt this was likely residual from post-op status but planned to consider CTA to rule out PE if this persisted. Nurse reports they haven't been successful weaning this evening (similar to prior nights) but O2 sats remain able to normalize on 2L as they have been, so asked them to page attending team to give update on whether any further intervention needed tonight. Jordy Verba PA-C

## 2019-11-04 NOTE — Evaluation (Addendum)
Occupational Therapy Evaluation Patient Details Name: [name] MRN: [id number] DOB: 1932-04-11 Today's Date: 11/04/2019    History of Present Illness 86 y.o. F with PMH of OA<  L breast Ca, HTN, DVT, Depression, squamous cell carcinoma and melanoma who ambulates with a cane and walker at baseline and sustained a mechanical fall at home. Pt found to have L distal femur fx s/p ORIF 4/9. Pt with hypotension post-op requiring pressors and admission to ICU.   Clinical Impression   PTA, pt was living at home with her husband, pt reports she was independent with ADL/IADL and functional mobility with use of rollator. Pt is a former PT. Pt currently requires minguard to progress to EOB, she required modA+2 for lateral scoot transfer toward Right side to drop arm recliner. Pt requires modA for LB dressing. Due to decline in current level of function, pt would benefit from acute OT to address established goals to facilitate safe D/C to venue listed below. At this time, recommend SNF follow-up. Will continue to follow acutely.     Follow Up Recommendations  SNF;Supervision/Assistance - 24 hour    Equipment Recommendations  3 in 1 bedside commode    Recommendations for Other Services       Precautions / Restrictions Precautions Precautions: Fall Restrictions Weight Bearing Restrictions: Yes LLE Weight Bearing: Touchdown weight bearing      Mobility Bed Mobility Overal bed mobility: Needs Assistance Bed Mobility: Supine to Sit     Supine to sit: Min guard;HOB elevated     General bed mobility comments: pt progressed self to EOB with minguard;she required increased time and effort  Transfers Overall transfer level: Needs assistance Equipment used: 1 person hand held assist;Rolling walker (2 wheeled) Transfers: Lateral/Scoot Transfers;Sit to/from Stand Sit to Stand: Total assist;From elevated surface(partial stand, able to clear buttocks a few inches)         Lateral/Scoot Transfers: Mod assist;+2 physical assistance;+2 safety/equipment General transfer comment: pt attempted sit<>stand x1, unable to progress into full upright position. Pt scooted toward Right to drop arm recliner, initially requiring minguard with increased time and effort, then progressing to requriing modA+2 to scoot into recliner    Balance Overall balance assessment: Needs assistance Sitting-balance support: No upper extremity supported;Feet supported Sitting balance-Leahy Scale: Fair     Standing balance support: Bilateral upper extremity supported Standing balance-Leahy Scale: Zero Standing balance comment: totalA to maintain partial stand                           ADL either performed or assessed with clinical judgement   ADL Overall ADL's : Needs assistance/impaired Eating/Feeding: Set up;Sitting   Grooming: Set up;Sitting   Upper Body Bathing: Min guard;Sitting   Lower Body Bathing: Moderate assistance;Sitting/lateral leans   Upper Body Dressing : Min guard;Sitting   Lower Body Dressing: Moderate assistance;Sitting/lateral leans   Toilet Transfer: Moderate assistance;+2 for safety/equipment Toilet Transfer Details (indicate cue type and reason): simulated to drop arm recliner, lateral scoot Toileting- Clothing Manipulation and Hygiene: Total assistance       Functional mobility during ADLs: Moderate assistance;+2 for safety/equipment;+2 for physical assistance General ADL Comments: pt limited by decreased strength pain and decreased activity tolerance     Vision         Perception     Praxis      Pertinent Vitals/Pain Pain Assessment: Faces Faces Pain Scale: Hurts little more Pain Location: LLE Pain Descriptors / Indicators: Grimacing Pain Intervention(s): Monitored  during session;Limited activity within patient's tolerance;Repositioned     Hand Dominance Right   Extremity/Trunk Assessment Upper Extremity Assessment Upper  Extremity Assessment: Generalized weakness   Lower Extremity Assessment Lower Extremity Assessment: Generalized weakness   Cervical / Trunk Assessment Cervical / Trunk Assessment: Kyphotic   Communication Communication Communication: No difficulties   Cognition Arousal/Alertness: Awake/alert Behavior During Therapy: WFL for tasks assessed/performed Overall Cognitive Status: No family/caregiver present to determine baseline cognitive functioning Area of Impairment: Memory                     Memory: Decreased short-term memory         General Comments: decreased short term memory, pt did remember TDWB precaution   General Comments  Pt on 3lnc, SpO2 88%-93% with mobility    Exercises     Shoulder Instructions      Home Living Family/patient expects to be discharged to:: Private residence Living Arrangements: Spouse/significant other Available Help at Discharge: Family;Available 24 hours/day(spouse is 66 years old, in good health per pt) Type of Home: House Home Access: Stairs to enter [employer] of Steps: 3 Entrance Stairs-Rails: Can reach both Home Layout: Multi-level;Able to live on main level with bedroom/bathroom     Bathroom Shower/Tub: Walk-in shower   Bathroom Toilet: Handicapped height     Home Equipment: Environmental consultant - 2 wheels;Walker - 4 wheels;Shower seat - built in;Grab bars - tub/shower          Prior Functioning/Environment Level of Independence: Independent with assistive device(s)        Comments: pt utilizies 4 wheeled walker without seat for mobility. Pt is former PT        OT Problem List: Decreased strength;Decreased range of motion;Decreased activity tolerance;Impaired balance (sitting and/or standing);Decreased safety awareness;Decreased knowledge of precautions;Decreased knowledge of use of DME or AE;Cardiopulmonary status limiting activity;Pain      OT Treatment/Interventions: Self-care/ADL training;Therapeutic  exercise;DME and/or AE instruction;Therapeutic activities;Patient/family education;Balance training    OT Goals(Current goals can be found in the care plan section) Acute Rehab OT Goals Patient Stated Goal: To improve mobility and reduce falls risk OT Goal Formulation: With patient Time For Goal Achievement: 11/18/19 Potential to Achieve Goals: Good ADL Goals Pt Will Perform Grooming: with min assist;standing;sitting Pt Will Perform Lower Body Dressing: with min assist;with adaptive equipment;sit to/from stand Pt Will Transfer to Toilet: with min assist;stand pivot transfer Additional ADL Goal #1: Pt will be independent with 3 fall prevention strategies during ADL/IADL and functional mobiltiy.  OT Frequency: Min 2X/week   Barriers to D/C: Decreased caregiver support          Co-evaluation              AM-PAC OT "6 Clicks" Daily Activity     Outcome Measure Help from another person eating meals?: A Little Help from another person taking care of personal grooming?: A Little Help from another person toileting, which includes using toliet, bedpan, or urinal?: A Lot Help from another person bathing (including washing, rinsing, drying)?: A Lot Help from another person to put on and taking off regular upper body clothing?: A Little Help from another person to put on and taking off regular lower body clothing?: A Lot 6 Click Score: 15   End of Session Equipment Utilized During Treatment: Oxygen;Gait belt;Rolling walker Nurse Communication: Mobility status  Activity Tolerance: Patient tolerated treatment well Patient left: in chair;with call bell/phone within reach;with chair alarm set  OT Visit Diagnosis: Unsteadiness on feet (R26.81);Other abnormalities  of gait and mobility (R26.89);Muscle weakness (generalized) (M62.81);Pain Pain - Right/Left: Left Pain - part of body: Leg                Time: [id number] OT Time Calculation (min): 32 min Charges:  OT General Charges $OT  Visit: 1 Visit OT Evaluation $OT Eval Moderate Complexity: 1 Mod OT Treatments $Self Care/Home Management : 8-22 mins  Helene Kelp OTR/L Acute Rehabilitation Services Office: [phone]   Wyn Forster 11/04/2019, 5:01 PM

## 2019-11-04 NOTE — TOC Transition Note (Signed)
Transition of Care [facility]) - CM/SW Discharge Note   Patient Details  Name: [name] MRN: [id number] Date of Birth: [birth year]  [facility] CM/SW Contact:  Curlene Labrum, RN Phone Number: 11/04/2019, 5:00 PM   Clinical Narrative:    Case management met with the patient - S/P distal left femur fracture after a fall in her kitchen.  The patient lives with her husband at home.  The patient and her husband were offered Medicare choice regarding nursing home placement - husband called on the phone and he states that [place] care is his preference.  [facility] [facility] called and insurance authorization started.  Secure chatted with the physician, Dr. Maylene Roes, and repeat COVID test order to be placed.    Barriers to Discharge: Ship broker, SNF Pending bed offer   Patient Goals and CMS Choice Patient states their goals for this hospitalization and ongoing recovery are:: "I want to be able to be independent again. I'm at home alone all day and I need to be able to take care of myself." CMS Medicare.gov Compare Post Acute Care list provided to:: Patient Choice offered to / list presented to : Patient  Discharge Placement                       Discharge Plan and Services In-house Referral: Clinical Social Work Discharge Planning Services: NA Post Acute Care Choice: [place]          DME Arranged: N/A DME Agency: NA                  Social Determinants of Health ([place]) Interventions     Readmission Risk Interventions Readmission Risk Prevention Plan 11/04/2019  Transportation Screening Complete  PCP or Specialist Appt within 5-7 Days Complete  Home Care Screening Complete  Medication Review (RN CM) Complete  Some recent data might be hidden

## 2019-11-04 NOTE — Progress Notes (Signed)
[facility]uma Service   Pt off the floor for cardiac procedure Reviewed therapy notes. Pt required max assist for bed mobility  Continue to follow therapy recs SNF will likely be needed at dc   Will see pt tomorrow   Jari Pigg, PA-C [phone] (C) 11/04/2019, 9:43 AM  [facility] [place]  [id number] [phone] Domingo Sep (F)

## 2019-11-04 NOTE — Plan of Care (Signed)

## 2019-11-05 ENCOUNTER — Encounter (HOSPITAL_COMMUNITY): Payer: Medicare Other

## 2019-11-05 ENCOUNTER — Encounter: Payer: Self-pay | Admitting: *Deleted

## 2019-11-05 ENCOUNTER — Inpatient Hospital Stay (HOSPITAL_COMMUNITY): Payer: Medicare Other

## 2019-11-05 DIAGNOSIS — R9439 Abnormal result of other cardiovascular function study: Secondary | ICD-10-CM

## 2019-11-05 DIAGNOSIS — I25119 Atherosclerotic heart disease of native coronary artery with unspecified angina pectoris: Secondary | ICD-10-CM

## 2019-11-05 LAB — D-DIMER, QUANTITATIVE: D-Dimer, Quant: 6.09 ug/mL-FEU — ABNORMAL HIGH (ref 0.00–0.50)

## 2019-11-05 LAB — NM MYOCAR MULTI W/SPECT W/WALL MOTION / EF
Peak HR: 101 {beats}/min
Rest HR: 90 {beats}/min

## 2019-11-05 LAB — SARS CORONAVIRUS 2 (TAT 6-24 HRS): SARS Coronavirus 2: NEGATIVE

## 2019-11-05 LAB — HEPARIN LEVEL (UNFRACTIONATED): Heparin Unfractionated: 0.38 IU/mL (ref 0.30–0.70)

## 2019-11-05 MED ORDER — METOPROLOL TARTRATE 25 MG PO TABS
12.5000 mg | ORAL_TABLET | Freq: Once | ORAL | Status: AC
  Administered 2019-11-06: 12.5 mg via ORAL
  Filled 2019-11-05: qty 1

## 2019-11-05 MED ORDER — IOHEXOL 350 MG/ML SOLN
100.0000 mL | Freq: Once | INTRAVENOUS | Status: AC | PRN
  Administered 2019-11-05: 100 mL via INTRAVENOUS

## 2019-11-05 MED ORDER — PRO-STAT SUGAR FREE PO LIQD
30.0000 mL | Freq: Two times a day (BID) | ORAL | Status: DC
  Administered 2019-11-05 – 2019-11-08 (×5): 30 mL via ORAL
  Filled 2019-11-05 (×7): qty 30

## 2019-11-05 MED ORDER — HEPARIN (PORCINE) 25000 UT/250ML-% IV SOLN
1100.0000 [IU]/h | INTRAVENOUS | Status: DC
  Administered 2019-11-05 – 2019-11-06 (×2): 1100 [IU]/h via INTRAVENOUS
  Filled 2019-11-05 (×2): qty 250

## 2019-11-05 NOTE — Progress Notes (Signed)
ANTICOAGULATION CONSULT NOTE - Initial Consult  Pharmacy Consult for Heparin Indication: pulmonary embolus  Allergies  Allergen Reactions  . Oysters [Shellfish Allergy] Nausea And Vomiting  . Codeine Nausea And Vomiting    Patient Measurements: Height: 5\' 6"  (167.6 cm) Weight: 67.9 kg (149 lb 11.1 oz) IBW/kg (Calculated) : 59.3 Heparin Dosing Weight: 68 kg  Vital Signs: Temp: 98 F (36.7 C) (04/13 0821) Temp Source: Oral (04/13 0821) BP: 118/69 (04/13 0821) Pulse Rate: 79 (04/13 0821)  Labs: Recent Labs    11/03/19 0538 11/04/19 0439  HGB 10.3* 10.2*  HCT 32.1* 31.7*  PLT 160 169  CREATININE 0.79 0.64    Estimated Creatinine Clearance: 48.1 mL/min (by C-G formula based on SCr of 0.64 mg/dL).   Medical History: Past Medical History:  Diagnosis Date  . Arthritis    "in my back"  . Breast cancer ([place]) 1996   left breast cancer   . Complication of anesthesia 04/18/2012   "didn't tolerate it today very well; had the shakes and very hard time w/it"  . Depression 11/20/2012  . DVT (deep venous thrombosis) ([place])   . High cholesterol   . History of alcoholism ([place])    7 1/2 years clean  . Hypertension   . Melanoma ([place]) 01/15/2004   upper right arm  . Osteoporosis   . Peripheral vascular disease (HCC)    hx of ligation   . Personal history of radiation therapy 1993   Left Breast Cancer  . SCC (squamous cell carcinoma) 01/15/2004   left post upper arm, Right elbow, post left knee  . SCC (squamous cell carcinoma) 05/21/2013   right forearm, right jawline, left cheek  . SCC (squamous cell carcinoma) 08/24/2016   right shin,right thigh  . SCC (squamous cell carcinoma) 11/01/2016  . SCC (squamous cell carcinoma) 02/02/2018   left outer arm,right outer thigh  . SCC (squamous cell carcinoma) 12/13/2018   right jawline  . Spinal stenosis     Assessment: 57 YOF who presented on 4/9 with a left distal femur fracture s/p repair on 4/9, post-op course complicated  by hypotension requiring pressors, and now with imaging showing "multiple small bilateral segmental and subsegmental pulmonary emboli with evidence of RHS". Pharmacy consulted to start Heparin for anticoagulation.   The patient did receive a dose of Lovenox 40 mg SQ at 1044 today. Given this, the patient's low wt, and the patient's recent surgery - will hold off on a bolus with initiation but will still be aggressive with the rate. Will aim for the lower end of the therapeutic range given recent surgery.   Goal of Therapy:  Heparin level 0.3-0.5 units/ml Monitor platelets by anticoagulation protocol: Yes   Plan:  - Start Heparin at 1100 units/hr  - Will continue to monitor for any signs/symptoms of bleeding and will follow up with heparin level in 8 hours   Thank you for allowing pharmacy to be a part of this patient's care.  Alycia Rossetti, PharmD, BCPS Clinical Pharmacist Clinical phone for 11/05/2019: [phone] 11/05/2019 1:50 PM   **Pharmacist phone directory can now be found on [email or web address] ([email or web address]).  Listed under [place].

## 2019-11-05 NOTE — Progress Notes (Signed)
Progress Note  Patient Name: [name] Date of Encounter: 11/05/2019  Primary Cardiologist: Sanda Klein, MD  Subjective   Seen in Nuclear Medicine prior to re-imaging of resting cardiac imaging. Feeling well. Anxious about knee/hip pain with hard table. Denies chest pain or SOB.    Inpatient Medications    Scheduled Meds:  Chlorhexidine Gluconate Cloth  6 each Topical Daily   enoxaparin (LOVENOX) injection  40 mg Subcutaneous Q24H   feeding supplement (ENSURE ENLIVE)  237 mL Oral BID BM   mouth rinse  15 mL Mouth Rinse BID   multivitamin with minerals  1 tablet Oral Daily   nutrition supplement (JUVEN)  1 packet Oral BID BM   rosuvastatin  20 mg Oral q1800   Continuous Infusions:  sodium chloride 250 mL (11/01/19 2105)   PRN Meds: acetaminophen, docusate sodium, HYDROcodone-acetaminophen, metoCLOPramide **OR** metoCLOPramide (REGLAN) injection, morphine injection, polyethylene glycol   Vital Signs    Vitals:   11/04/19 1258 11/04/19 1635 11/04/19 2046 11/05/19 0821  BP: 117/65 139/77 109/70 118/69  Pulse: 82 96 91 79  Resp: 17 18 16 18   Temp: 98.2 F (36.8 C) 98.8 F (37.1 C) 97.8 F (36.6 C) 98 F (36.7 C)  TempSrc: Oral Axillary Oral Oral  SpO2: 90% 94% 90% 99%  Weight:      Height:        Intake/Output Summary (Last 24 hours) at 11/05/2019 1004 Last data filed at 11/05/2019 0556 Gross per 24 hour  Intake --  Output 1900 ml  Net -1900 ml   Filed Weights   11/01/19 0904 11/02/19 0500  Weight: 63.5 kg 67.9 kg    Physical Exam   General: Elderly, frail, NAD Neck: Negative for carotid bruits. No JVD Lungs:Clear to ausculation bilaterally. No wheezes, rales, or rhonchi. Breathing is unlabored. Cardiovascular: RRR with S1 S2. No murmurs Abdomen: Soft, non-tender, non-distended. No obvious abdominal masses. Extremities: No edema. Radial pulses 2+ bilaterally Neuro: Alert and oriented. No focal deficits. No facial asymmetry. MAE  spontaneously. Psych: Responds to questions appropriately with normal affect.    Labs    Chemistry Recent Labs  Lab 10/31/19 2235 11/01/19 0430 11/02/19 0029 11/03/19 0538 11/04/19 0439  NA 140   < > 138 136 136  K 3.2*   < > 3.8 4.1 3.8  CL 101   < > 102 98 99  CO2 27   < > 24 29 25   GLUCOSE 138*   < > 162* 124* 112*  BUN 21   < > 18 26* 29*  CREATININE 0.59   < > 0.75 0.79 0.64  CALCIUM 9.5   < > 8.7* 9.0 8.7*  PROT 7.1  --  5.9*  --   --   ALBUMIN 3.8  --  3.4*  --   --   AST 35  --  55*  --   --   ALT 28  --  37  --   --   ALKPHOS 64  --  58  --   --   BILITOT 0.8  --  1.4*  --   --   GFRNONAA >60   < > >60 >60 >60  GFRAA >60   < > >60 >60 >60  ANIONGAP 12   < > 12 9 12    < > = values in this interval not displayed.     Hematology Recent Labs  Lab 11/02/19 0029 11/03/19 0538 11/04/19 0439  WBC 11.9* 8.4 7.2  RBC  3.83* 3.33* 3.30*  HGB 12.0 10.3* 10.2*  HCT 37.0 32.1* 31.7*  MCV 96.6 96.4 96.1  MCH 31.3 30.9 30.9  MCHC 32.4 32.1 32.2  RDW 13.2 13.3 13.2  PLT 244 160 169    Cardiac EnzymesNo results for input(s): TROPONINI in the last 168 hours. No results for input(s): TROPIPOC in the last 168 hours.   BNPNo results for input(s): BNP, PROBNP in the last 168 hours.   DDimer  Recent Labs  Lab 11/05/19 0818  DDIMER 6.09*    Radiology    No results found.  Telemetry    Unable to assess as she was seen in nuclear medicine- Personally Reviewed  ECG    No new tracing as of 11/05/19- Personally Reviewed  Cardiac Studies   Echocardiogram  11/02/19  1. False tendon in LV apex of no clinical significance. Left ventricular  ejection fraction, by estimation, is 40 to 45%. The left ventricle has  mildly decreased function. The left ventricle demonstrates global  hypokinesis. There is severe left  ventricular hypertrophy of the basal-septal segment. Left ventricular  diastolic parameters are consistent with Grade II diastolic dysfunction    (pseudonormalization). Elevated left ventricular end-diastolic pressure.  2. Right ventricular systolic function is moderately reduced. The right  ventricular size is mildly enlarged. There is moderately elevated  pulmonary artery systolic pressure.  3. Right atrial size was severely dilated.  4. The mitral valve is normal in structure. Trivial mitral valve  regurgitation. No evidence of mitral stenosis.  5. The aortic valve is tricuspid. Aortic valve regurgitation is mild.  Mild to moderate aortic valve sclerosis/calcification is present, without  any evidence of aortic stenosis.  6. Aortic dilatation noted. There is mild dilatation of the ascending  aorta measuring 41 mm.  7. The inferior vena cava is dilated in size with <50% respiratory  variability, suggesting right atrial pressure of 15 mmHg.   Patient Profile     86 y.o. female with history of breast cancertreated with lumpectomy and radiation (1995), paroxysmal atrial tachycardia, orthostatic hypotension, HLD, remote hx of DVT, frequent PVCs/NSVT by monitor 01/2018, chronic sinus bradycardia,prominent bilateral varicose veins/pbilateral saphenectomy, chronic venous insufficiency with LE edema. She was admitted 10/2019 with fall while carrying in groceries. She was on the ground for 5 hours (CK 272). She was found to have distal femur fracture that required ORIF. Post-op course notable for hypotension requiring pressors and IV fluids. This appears to have been felt related to hypovolemia, vasodilatory state, sedation. Her troponin was also noted to be elevated 3,180--> 3,672--> 3,375. TTE showed EF 40-45% withglobal hypokinesis,severe LVH of the basal septal segment,G2 DD with elevatedLVEDP, moderately reduced RV systolic function with mild right ventricular enlargement,mild to moderate aortic valve sclerosis without aortic stenosis and mild dilatation of the ascending aorta at 41 mm. Cardiology consulted for elevated  troponin.  Assessment & Plan    1. Mechanical fall with hip fracture: -s/p ORIF>>>managment per ortho team -Minimal pain on exam  2. Elevated troponin with possible NSTEMI versus demand ischemia - -HST peaked at 3672 -Echocardiogram this admission with new LV dysfunction compared to prior. -Nuclear stress test was ordered however poor images during resting pictures. Plan was for re-imaging today and then results will be sent to [facility] to read.  -Simvastatin was switched to rosuvastatin this admission.  -If the patient is tolerating statin at time of follow-up appointment, would consider rechecking liver function/lipid panel in 6-8 weeks.  -ASA 81mg  when OK with surgery  3. New  cardiomyopathy: -Echo with results as above  -Etiology not clear, question stress induced from surgery vs related to underlying ischemia.  -Not on BB given h/o sinus bradycardia and baseline conduction abnormalities. -BP on the softer side so would hold off on addition of ACEI/ARB for now.  -Continues to appear euvolemic on exam   4. H/o PAT, PVCs, NSVT: -K+, 3.8 on re-check yesterday  -Will review tele once back to floor>>seen in nuclear medicine this AM  -Denies palpitations   5. Mild ascending aortic aneurysm: -Measuring 4.1cm.  -Needs OP management as dictated by clinical status.   Signed, Kathyrn Drown NP-C [facility] Pager: [phone] 11/05/2019, 10:04 AM     For questions or updates, please contact   Please consult [email or web address] for contact info under Cardiology/STEMI.

## 2019-11-05 NOTE — Plan of Care (Signed)
  Problem: Education: Goal: Knowledge of General Education information will improve Description: Including pain rating scale, medication(s)/side effects and non-pharmacologic comfort measures Outcome: Progressing   Problem: Health Behavior/Discharge Planning: Goal: Ability to manage health-related needs will improve Outcome: Progressing   Problem: Clinical Measurements: Goal: Will remain free from infection Outcome: Progressing Goal: Respiratory complications will improve Outcome: Progressing Goal: Cardiovascular complication will be avoided Outcome: Progressing   Problem: Nutrition: Goal: Adequate nutrition will be maintained Outcome: Progressing   Problem: Elimination: Goal: Will not experience complications related to urinary retention Outcome: Progressing   Problem: Pain Managment: Goal: General experience of comfort will improve Outcome: Progressing   Problem: Safety: Goal: Ability to remain free from injury will improve Outcome: Progressing   Problem: Skin Integrity: Goal: Risk for impaired skin integrity will decrease Outcome: Progressing

## 2019-11-05 NOTE — Plan of Care (Signed)

## 2019-11-05 NOTE — Progress Notes (Addendum)
Pt oxygen saturation in the mid 80s when on RA. Pt currently on 2L of oxygen, sating in the mid 90s. Will continue to monitor. Educated pt on taking deep breaths in the nose and out the mouth.   Bladder scan >999, in&out cath obtained 1939mL, notified Dr. Jerilynn Mages. Shar[place].

## 2019-11-05 NOTE — Progress Notes (Signed)
[place] for Heparin Indication: pulmonary embolus  Allergies  Allergen Reactions  . Oysters [Shellfish Allergy] Nausea And Vomiting  . Codeine Nausea And Vomiting    Patient Measurements: Height: 5\' 6"  (167.6 cm) Weight: 67.9 kg (149 lb 11.1 oz) IBW/kg (Calculated) : 59.3 Heparin Dosing Weight: 68 kg  Vital Signs: Temp: 98.3 F (36.8 C) (04/13 1921) Temp Source: Oral (04/13 1921) BP: 120/70 (04/13 1921) Pulse Rate: 82 (04/13 1921)  Labs: Recent Labs    11/03/19 0538 11/04/19 0439 11/05/19 2202  HGB 10.3* 10.2*  --   HCT 32.1* 31.7*  --   PLT 160 169  --   HEPARINUNFRC  --   --  0.38  CREATININE 0.79 0.64  --     Estimated Creatinine Clearance: 48.1 mL/min (by C-G formula based on SCr of 0.64 mg/dL).   Medical History: Past Medical History:  Diagnosis Date  . Arthritis    "in my back"  . Breast cancer ([place]) 1996   left breast cancer   . Complication of anesthesia 04/18/2012   "didn't tolerate it today very well; had the shakes and very hard time w/it"  . Depression 11/20/2012  . DVT (deep venous thrombosis) ([place])   . High cholesterol   . History of alcoholism ([place])    7 1/2 years clean  . Hypertension   . Melanoma ([place]) 01/15/2004   upper right arm  . Osteoporosis   . Peripheral vascular disease ([facility])    hx of ligation   . Personal history of radiation therapy 1993   Left Breast Cancer  . SCC (squamous cell carcinoma) 01/15/2004   left post upper arm, Right elbow, post left knee  . SCC (squamous cell carcinoma) 05/21/2013   right forearm, right jawline, left cheek  . SCC (squamous cell carcinoma) 08/24/2016   right shin,right thigh  . SCC (squamous cell carcinoma) 11/01/2016  . SCC (squamous cell carcinoma) 02/02/2018   left outer arm,right outer thigh  . SCC (squamous cell carcinoma) 12/13/2018   right jawline  . Spinal stenosis     Assessment: 66 YOF who presented on 4/9 with a left distal femur  fracture s/p repair on 4/9, post-op course complicated by hypotension requiring pressors, and now with imaging showing "multiple small bilateral segmental and subsegmental pulmonary emboli with evidence of RHS". Pharmacy consulted to start Heparin for anticoagulation.   The patient did receive a dose of Lovenox 40 mg SQ at 1044 today. Given this, the patient's low wt, and the patient's recent surgery - will hold off on a bolus with initiation but will still be aggressive with the rate. Will aim for the lower end of the therapeutic range given recent surgery.   4/13 PM update:  Heparin level therapeutic   Goal of Therapy:  Heparin level 0.3-0.5 units/ml Monitor platelets by anticoagulation protocol: Yes   Plan:  -Cont heparin at 1100 units/hr -Confirmatory heparin level with AM labs  Narda Bonds, PharmD, [place] Pharmacist Phone: [phone]

## 2019-11-05 NOTE — Progress Notes (Signed)
Physical Therapy Treatment Patient Details Name: [name] MRN: [id number] DOB: 11/23/1931 Today's Date: 11/05/2019    History of Present Illness 86 y.o. F with PMH of OA<  L breast Ca, HTN, DVT, Depression, squamous cell carcinoma and melanoma who ambulates with a cane and walker at baseline and sustained a mechanical fall at home. Pt found to have L distal femur fx s/p ORIF 4/9. Pt with hypotension post-op requiring pressors and admission to ICU.    PT Comments    Pt supine in bed on arrival this session.  She is terrified to attempt transfer training.  Focused on postural exercises edge of bed and facilitating weight shifting forward into R LE.  She continues to benefit from SNF placement to improve strength and function before returning home.     Follow Up Recommendations  SNF;Supervision/Assistance - 24 hour     Equipment Recommendations  Wheelchair (measurements PT);Wheelchair cushion (measurements PT);Hospital bed;Other (comment)    Recommendations for Other Services       Precautions / Restrictions Precautions Precautions: Fall Restrictions Weight Bearing Restrictions: Yes LLE Weight Bearing: Touchdown weight bearing    Mobility  Bed Mobility Overal bed mobility: Needs Assistance Bed Mobility: Supine to Sit;Sit to Supine     Supine to sit: +2 for physical assistance;HOB elevated;Mod assist Sit to supine: Max assist;+2 for physical assistance   General bed mobility comments: Pt able to come to edge of bed with assistance to move LEs to edge and elevate trunk into sitting.  Pt required lift assistance from LEs back to bed.  Transfers Overall transfer level: Needs assistance               General transfer comment: Facilitated weight shifting forward but patient is too terrified to attmept standing so bottom did not leave edge of bed.  Ambulation/Gait                 Stairs             Wheelchair Mobility    Modified Rankin  (Stroke Patients Only)       Balance     Sitting balance-Leahy Scale: Poor                                      Cognition Arousal/Alertness: Awake/alert Behavior During Therapy: Anxious Overall Cognitive Status: No family/caregiver present to determine baseline cognitive functioning                                        Exercises Other Exercises Other Exercises: Cervical flexion/extension 1x10 reps. Other Exercises: B shoulder flexion and extension 1x10 reps. Other Exercises: reverse prayer stretch x 10 min in chair position in bed.  Bolster behind back inbetween shoulder blades.    General Comments        Pertinent Vitals/Pain Pain Assessment: Faces Faces Pain Scale: Hurts even more Pain Location: LLE Pain Descriptors / Indicators: Grimacing Pain Intervention(s): Monitored during session;Repositioned    Home Living                      Prior Function            PT Goals (current goals can now be found in the care plan section) Acute Rehab PT Goals Patient Stated Goal: To improve mobility and  reduce falls risk Potential to Achieve Goals: Fair Additional Goals Additional Goal #1: Pt will mobilize in manual wheelchair for 50' with use of BUE and with minA. Progress towards PT goals: Progressing toward goals    Frequency    Min 3X/week      PT Plan Current plan remains appropriate    Co-evaluation              AM-PAC PT "6 Clicks" Mobility   Outcome Measure  Help needed turning from your back to your side while in a flat bed without using bedrails?: Total Help needed moving from lying on your back to sitting on the side of a flat bed without using bedrails?: Total Help needed moving to and from a bed to a chair (including a wheelchair)?: Total Help needed standing up from a chair using your arms (e.g., wheelchair or bedside chair)?: Total Help needed to walk in hospital room?: Total Help needed climbing  3-5 steps with a railing? : Total 6 Click Score: 6    End of Session Equipment Utilized During Treatment: Gait belt Activity Tolerance: Patient limited by pain(limited due to anxiety and fear of falling.) Patient left: in bed;with call bell/phone within reach;with nursing/sitter in room;with family/visitor present;with bed alarm set Nurse Communication: Mobility status PT Visit Diagnosis: Muscle weakness (generalized) (M62.81);History of falling (Z91.81);Unsteadiness on feet (R26.81);Pain Pain - Right/Left: Left Pain - part of body: Leg     Time: [id number] PT Time Calculation (min) (ACUTE ONLY): 28 min  Charges:  $Therapeutic Exercise: 8-22 mins $Therapeutic Activity: 8-22 mins                     Erasmo Leventhal , PTA Acute Rehabilitation Services Pager [phone] Office [phone]     Landrie Beale Eli Hose 11/05/2019, 5:41 PM

## 2019-11-05 NOTE — Progress Notes (Addendum)
PROGRESS NOTE    [name]  [id number] DOB: 1931/08/03 DOA: 10/31/2019 PCP: Burnard Bunting, MD     Brief Narrative:  [name] is an 86 yo female with past medical history significant for HTN, NSVT, PVC, diastolic heart failure, history of left breast cancer, depression, osteoporosis, osteoarthritis who presented to the hospital with left leg pain. She had been grocery shopping, tripped on a rug and fell to the ground.  In the emergency department, she was found to have distal left femur fracture.  She was transferred from [facility] to [facility] for surgical intervention.  Postoperatively, she developed hypotension and was transferred to the ICU for pressors. BP improved and patient transferred to telemetry and under [facility] service on 4/11. Cardiology was consulted due to elevated troponin with new LV dysfunction. She underwent stress test on 4/12.   New events last 24 hours / Subjective: Patient with no complaints of CP or SOB. She was unable to wean off [place] O2 yesterday. Required in and out cath due to urinary retention.   Assessment & Plan:   Principal Problem:   Closed fracture of distal end of left femur, initial encounter ([place]) Active Problems:   Hypokalemia   Pure hypercholesterolemia   Essential hypertension, benign   Diastolic dysfunction without heart failure   Prolonged QT interval   Other fracture of left femur, initial encounter for closed fracture [facility])   Hip fracture (HCC)   Postprocedural hypotension   Non-ST elevation (NSTEMI) myocardial infarction [facility])   Distal left femur fracture -Status post ORIF by Dr. Marcelino Scot 4/9 -Pain control -PT recommends SNF placement  NSTEMI -Peak troponin 3672 -Echocardiogram revealed EF 40 to 45% with global hypokinesis, severe left ventricular hypertrophy, grade 2 diastolic dysfunction -Cardiology consulted, stress test pending result   Acute hypoxemic respiratory failure -Elevated d-dimer 6.09   -Wells pre-test probability of PE 4.5, indicating moderate probability  -Check CTA chest to rule out PE in setting of post-op  -Currently on 2L  O2   Addendum: CTA positive for multiple PE. Start IV heparin, discussed with ortho PA regarding starting anticoagulation. Also updated husband regarding new finding. Check DVT US.   Acute urinary retention -Required in and out cath 4/12 overnight with 1922ml UOP. Continue to check bladder scan PRN   Postop hypotension -Thought to be sedation related, now off pressor and stable blood pressure 107/56   Essential hypertension -Hold home Maxide  Chronic diastolic heart failure -Does not appear to be fluid overloaded on exam   HLD -Continue crestor     DVT prophylaxis: Lovenox Code Status: Full Family Communication: None at bedside; updated spouse over the phone this morning regarding pending tests ordered  Disposition Plan:  Status is: Inpatient  Remains inpatient appropriate because:Ongoing diagnostic testing needed not appropriate for outpatient work up   Dispo: The patient is from: Home              Anticipated d/c is to: SNF              Anticipated d/c date is: 1 day              Patient currently is not medically stable to d/c.    Consultants:   Orthopedic surgery  PCCM  Cardiology   Antimicrobials:  Anti-infectives (From admission, onward)   Start     Dose/Rate Route Frequency Ordered Stop   11/01/19 2100  ceFAZolin (ANCEF) IVPB 1 g/50 mL premix     1 g 100  mL/hr over 30 Minutes Intravenous Every 6 hours 11/01/19 2037 11/02/19 1646   11/01/19 1145  ceFAZolin (ANCEF) IVPB 2g/100 mL premix     2 g 200 mL/hr over 30 Minutes Intravenous On call to O.R. 11/01/19 1141 11/01/19 1533       Objective: Vitals:   11/04/19 1258 11/04/19 1635 11/04/19 2046 11/05/19 0821  BP: 117/65 139/77 109/70 118/69  Pulse: 82 96 91 79  Resp: 17 18 16 18   Temp: 98.2 F (36.8 C) 98.8 F (37.1 C) 97.8 F (36.6 C) 98 F (36.7  C)  TempSrc: Oral Axillary Oral Oral  SpO2: 90% 94% 90% 99%  Weight:      Height:        Intake/Output Summary (Last 24 hours) at 11/05/2019 1030 Last data filed at 11/05/2019 0556 Gross per 24 hour  Intake -  Output 1900 ml  Net -1900 ml   Filed Weights   11/01/19 0904 11/02/19 0500  Weight: 63.5 kg 67.9 kg    Examination: General exam: Appears calm and comfortable  Respiratory system: Clear to auscultation. Respiratory effort normal. No conversational dyspnea. On [place] O2  Cardiovascular system: S1 & S2 heard, RRR. No pedal edema. Gastrointestinal system: Abdomen is nondistended, soft and nontender. Normal bowel sounds heard. Central nervous system: Alert and oriented. Non focal exam. Speech clear  Extremities: Symmetric in appearance bilaterally  Skin: No rashes, lesions or ulcers on exposed skin  Psychiatry: Judgement and insight appear stable. Mood & affect appropriate.    Data Reviewed: I have personally reviewed following labs and imaging studies  CBC: Recent Labs  Lab 11/01/19 0430 11/01/19 1914 11/02/19 0029 11/03/19 0538 11/04/19 0439  WBC 9.6 11.9* 11.9* 8.4 7.2  NEUTROABS  --   --  9.7*  --   --   HGB 11.0* 11.7* 12.0 10.3* 10.2*  HCT 33.7* 36.6 37.0 32.1* 31.7*  MCV 95.2 96.8 96.6 96.4 96.1  PLT 215 190 244 160 [id number]   Basic Metabolic Panel: Recent Labs  Lab 10/31/19 2235 10/31/19 2235 11/01/19 0430 11/01/19 1914 11/02/19 0029 11/03/19 0538 11/04/19 0439  NA 140  --  141  --  138 136 136  K 3.2*  --  3.0*  --  3.8 4.1 3.8  CL 101  --  103  --  102 98 99  CO2 27  --  26  --  24 29 25   GLUCOSE 138*  --  114*  --  162* 124* 112*  BUN 21  --  18  --  18 26* 29*  CREATININE 0.59   < > 0.51 0.72 0.75 0.79 0.64  CALCIUM 9.5  --  8.2*  --  8.7* 9.0 8.7*  MG  --   --  1.3*  --  1.6*  --   --   PHOS  --   --   --   --  3.5  --   --    < > = values in this interval not displayed.   GFR: Estimated Creatinine Clearance: 48.1 mL/min (by C-G formula based  on SCr of 0.64 mg/dL). Liver Function Tests: Recent Labs  Lab 10/31/19 2235 11/02/19 0029  AST 35 55*  ALT 28 37  ALKPHOS 64 58  BILITOT 0.8 1.4*  PROT 7.1 5.9*  ALBUMIN 3.8 3.4*   No results for input(s): LIPASE, AMYLASE in the last 168 hours. No results for input(s): AMMONIA in the last 168 hours. Coagulation Profile: No results for input(s): INR, PROTIME in the last  168 hours. Cardiac Enzymes: Recent Labs  Lab 10/31/19 2235 11/02/19 0029  CKTOTAL 272* 243*   BNP (last 3 results) No results for input(s): PROBNP in the last 8760 hours. HbA1C: No results for input(s): HGBA1C in the last 72 hours. CBG: No results for input(s): GLUCAP in the last 168 hours. Lipid Profile: Recent Labs    11/04/19 0439  CHOL 133  HDL 44  LDLCALC 69  TRIG 102  CHOLHDL 3.0   Thyroid Function Tests: No results for input(s): TSH, T4TOTAL, FREET4, T3FREE, THYROIDAB in the last 72 hours. Anemia Panel: No results for input(s): VITAMINB12, FOLATE, FERRITIN, TIBC, IRON, RETICCTPCT in the last 72 hours. Sepsis Labs: No results for input(s): PROCALCITON, LATICACIDVEN in the last 168 hours.  Recent Results (from the past 240 hour(s))  SARS CORONAVIRUS 2 (TAT 6-24 HRS) Nasopharyngeal Nasopharyngeal Swab     Status: None   Collection Time: 10/31/19 11:25 PM   Specimen: Nasopharyngeal Swab  Result Value Ref Range Status   SARS Coronavirus 2 NEGATIVE NEGATIVE Final    Comment: (NOTE) SARS-CoV-2 target nucleic acids are NOT DETECTED. The SARS-CoV-2 RNA is generally detectable in upper and lower respiratory specimens during the acute phase of infection. Negative results do not preclude SARS-CoV-2 infection, do not rule out co-infections with other pathogens, and should not be used as the sole basis for treatment or other patient management decisions. Negative results must be combined with clinical observations, patient history, and epidemiological information. The expected result is Negative.  Fact Sheet for Patients: SugarRoll.be Fact Sheet for Healthcare Providers: https://www.woods-mathews.com/ This test is not yet approved or cleared by the [place] FDA and  has been authorized for detection and/or diagnosis of SARS-CoV-2 by FDA under an Emergency Use Authorization (EUA). This EUA will remain  in effect (meaning this test can be used) for the duration of the COVID-19 declaration under Section 56 4(b)(1) of the Act, 21 U.S.C. section 360bbb-3(b)(1), unless the authorization is terminated or revoked sooner. Performed at [facility], [place] [address] [place], [place] [address]   MRSA PCR Screening     Status: None   Collection Time: 11/01/19  9:26 PM   Specimen: Nasal Mucosa; Nasopharyngeal  Result Value Ref Range Status   MRSA by PCR NEGATIVE NEGATIVE Final    Comment:        The GeneXpert MRSA Assay (FDA approved for NASAL specimens only), is one component of a comprehensive MRSA colonization surveillance program. It is not intended to diagnose MRSA infection nor to guide or monitor treatment for MRSA infections. Performed at [facility], [place] [address] [place], [place] [address]   SARS CORONAVIRUS 2 (TAT 6-24 HRS) Nasopharyngeal Nasopharyngeal Swab     Status: None   Collection Time: 11/04/19  7:08 PM   Specimen: Nasopharyngeal Swab  Result Value Ref Range Status   SARS Coronavirus 2 NEGATIVE NEGATIVE Final    Comment: (NOTE) SARS-CoV-2 target nucleic acids are NOT DETECTED. The SARS-CoV-2 RNA is generally detectable in upper and lower respiratory specimens during the acute phase of infection. Negative results do not preclude SARS-CoV-2 infection, do not rule out co-infections with other pathogens, and should not be used as the sole basis for treatment or other patient management decisions. Negative results must be combined with clinical observations, patient history, and epidemiological  information. The expected result is Negative. Fact Sheet for Patients: SugarRoll.be Fact Sheet for Healthcare Providers: https://www.woods-mathews.com/ This test is not yet approved or cleared by the [place] FDA  and  has been authorized for detection and/or diagnosis of SARS-CoV-2 by FDA under an Emergency Use Authorization (EUA). This EUA will remain  in effect (meaning this test can be used) for the duration of the COVID-19 declaration under Section 56 4(b)(1) of the Act, 21 U.S.C. section 360bbb-3(b)(1), unless the authorization is terminated or revoked sooner. Performed at [facility], [place] [address], [place], [place] [address]       Radiology Studies: No results found.    Scheduled Meds: . Chlorhexidine Gluconate Cloth  6 each Topical Daily  . enoxaparin (LOVENOX) injection  40 mg Subcutaneous Q24H  . feeding supplement (ENSURE ENLIVE)  237 mL Oral BID BM  . mouth rinse  15 mL Mouth Rinse BID  . multivitamin with minerals  1 tablet Oral Daily  . nutrition supplement (JUVEN)  1 packet Oral BID BM  . rosuvastatin  20 mg Oral q1800   Continuous Infusions: . sodium chloride 250 mL (11/01/19 2105)     LOS: 4 days      Time spent: 25 minutes   Dessa Phi, DO [facility] 11/05/2019, 10:30 AM   Available via Epic secure chat 7am-7pm After these hours, please refer to coverage provider listed on [email or web address]

## 2019-11-05 NOTE — Progress Notes (Signed)
Nutrition Follow-up  DOCUMENTATION CODES:   Not applicable  INTERVENTION:   -D/c Ensure Enlive po BID, each supplement provides 350 kcal and 20 grams of protein -D/c Juven -Continue MVI with minerals daily -Magic cup TID with meals, each supplement provides 290 kcal and 9 grams of protein -30 ml Prostat BID, each supplement provides 100 kcals and 15 grams protien  NUTRITION DIAGNOSIS:   Increased nutrient needs related to post-op healing as evidenced by estimated needs.  Ongoing  GOAL:   Patient will meet greater than or equal to 90% of their needs  Progressing   MONITOR:   Labs, I & O's, Skin, Supplement acceptance, PO intake, Weight trends  REASON FOR ASSESSMENT:   Consult Hip fracture protocol  ASSESSMENT:   86 year old female admitted for left hip fx s/p mechanical fall at home after tripping on a rug while bringing groceries inside. PMHx significant for left breast cancer, depression, remote alcoholism, chronic diastolic CHF, osteoporosis.  4/9- s/p OPEN REDUCTION INTERNAL FIXATION (ORIF) DISTAL FEMUR FRACTURE (Left) WITHOUT INTERCONDYLAR EXTENSION  Reviewed I/O's: -2.5 L x 24 hours and +3.1 ml since admission  UOP: 2.5 L x 24 hours  Pt out of room at time of visit; undergoing nuclear stress test due to elevated troponin.   Per MD notes, pt with newly diagnosed PE.   Intake has been erratic; noted meal completion 10-95%. Pt refusing both Ensure and Juven supplements.   Plan to d/c to SNF once medically stable.   Labs reviewed.   Diet Order:   Diet Order            Diet Heart Room service appropriate? Yes; Fluid consistency: Thin  Diet effective now              EDUCATION NEEDS:   No education needs have been identified at this time  Skin:  Skin Assessment: Skin Integrity Issues: Skin Integrity Issues:: Incisions Incisions: closed; L leg  Last BM:  10/31/19  Height:   Ht Readings from Last 1 Encounters:  11/01/19 5\' 6"  (1.676 m)     Weight:   Wt Readings from Last 1 Encounters:  11/02/19 67.9 kg    Ideal Body Weight:  59.1 kg  BMI:  Body mass index is 24.16 kg/m.  Estimated Nutritional Needs:   Kcal:  1500-1700  Protein:  80-95 grams  Fluid:  > 1.5 L    Loistine Chance, RD, LDN, [place] Registered Dietitian II Certified Diabetes Care and Education Specialist Please refer to [facility] for RD and/or RD on-call/weekend/after hours pager

## 2019-11-05 NOTE — Progress Notes (Signed)
Orthopaedic Trauma Service Progress Note  Patient ID: [name] MRN: [id number] DOB/AGE: August 23, 1931 86 y.o.  Subjective:  No specific complaints  Discussed with medicine about anticoagulation for newly dx'd PE   Ok to use whatever anticoagulation deemed necessary   No CP or SOB   ROS As above Objective:   VITALS:   Vitals:   11/04/19 1258 11/04/19 1635 11/04/19 2046 11/05/19 0821  BP: 117/65 139/77 109/70 118/69  Pulse: 82 96 91 79  Resp: 17 18 16 18   Temp: 98.2 F (36.8 C) 98.8 F (37.1 C) 97.8 F (36.6 C) 98 F (36.7 C)  TempSrc: Oral Axillary Oral Oral  SpO2: 90% 94% 90% 99%  Weight:      Height:        Estimated body mass index is 24.16 kg/m as calculated from the following:   Height as of this encounter: 5\' 6"  (1.676 m).   Weight as of this encounter: 67.9 kg.   Intake/Output      04/12 0701 - 04/13 0700 04/13 0701 - 04/14 0700   P.O.     I.V. (mL/kg)     Total Intake(mL/kg)     Urine (mL/kg/hr) 1900 (1.2)    Total Output 1900    Net -1900           LABS  Results for orders placed or performed during the hospital encounter of 10/31/19 (from the past 24 hour(s))  SARS CORONAVIRUS 2 (TAT 6-24 HRS) Nasopharyngeal Nasopharyngeal Swab     Status: None   Collection Time: 11/04/19  7:08 PM   Specimen: Nasopharyngeal Swab  Result Value Ref Range   SARS Coronavirus 2 NEGATIVE NEGATIVE  D-dimer, quantitative (not at [facility])     Status: Abnormal   Collection Time: 11/05/19  8:18 AM  Result Value Ref Range   D-Dimer, Quant 6.09 (H) 0.00 - 0.50 ug/mL-FEU     PHYSICAL EXAM:   Gen: sitting up in bed, NAD  Ext:       Left Lower Extremity   Dressing with some scant drainage noted  Incisions look great   No erythema, no active drainage noted  Pt lacks full knee extension   Distal motor and sensory functions intact  Ext warm  + DP pulse    Assessment/Plan: 4 Days  Post-Op   Principal Problem:   Closed fracture of distal end of left femur, initial encounter ([place]) Active Problems:   Hypokalemia   Pure hypercholesterolemia   Essential hypertension, benign   Diastolic dysfunction without heart failure   Prolonged QT interval   Other fracture of left femur, initial encounter for closed fracture (HCC)   Hip fracture (HCC)   Postprocedural hypotension   Non-ST elevation (NSTEMI) myocardial infarction ([place])   Anti-infectives (From admission, onward)   Start     Dose/Rate Route Frequency Ordered Stop   11/01/19 2100  ceFAZolin (ANCEF) IVPB 1 g/50 mL premix     1 g 100 mL/hr over 30 Minutes Intravenous Every 6 hours 11/01/19 2037 11/02/19 1646   11/01/19 1145  ceFAZolin (ANCEF) IVPB 2g/100 mL premix     2 g 200 mL/hr over 30 Minutes Intravenous On call to O.R. 11/01/19 1141 11/01/19 1533    .  POD/HD#: 57  86 y/o female ground level fall with L distal femur fracture   -  ground level fall   - L distal femur fracture s/p ORIF   TDWB L leg  Unrestricted ROM L knee  PT/OT  Ice and elevate for swelling and pain control  Dressing changes as needed starting now    - Pain management:  Minimize narcotics   - ABL anemia/Hemodynamics  H/h stable  - Medical issues   Per medicine and cardiology   - DVT/PE prophylaxis:  Now on heparin for treatment of PE   - ID:   periop abx completed   - Metabolic Bone Disease:  Vitamin d levels look ok   Fracture with low energy mechanism suggestive of fragility fracture  Recommend dexa in 4-8 weeks for further quantitative evaluation of bone density   Meets fracture liaison service criteria   - Activity:  Ok to be up from ortho standpoint  TDWB L leg   - Impediments to fracture healing:  Poor bone quality    - Dispo:  Continue with inpatient care  Ortho issues stable  SW consult     Jari Pigg, PA-C [phone] (C) 11/05/2019, 2:18 PM  [facility] [place] [address] [phone] Domingo Sep (F)

## 2019-11-06 ENCOUNTER — Inpatient Hospital Stay (HOSPITAL_COMMUNITY): Payer: Medicare Other

## 2019-11-06 DIAGNOSIS — I2699 Other pulmonary embolism without acute cor pulmonale: Secondary | ICD-10-CM | POA: Diagnosis not present

## 2019-11-06 DIAGNOSIS — R778 Other specified abnormalities of plasma proteins: Secondary | ICD-10-CM | POA: Diagnosis not present

## 2019-11-06 DIAGNOSIS — R9439 Abnormal result of other cardiovascular function study: Secondary | ICD-10-CM | POA: Diagnosis not present

## 2019-11-06 DIAGNOSIS — I429 Cardiomyopathy, unspecified: Secondary | ICD-10-CM | POA: Diagnosis not present

## 2019-11-06 LAB — BASIC METABOLIC PANEL
Anion gap: 11 (ref 5–15)
BUN: 22 mg/dL (ref 8–23)
CO2: 26 mmol/L (ref 22–32)
Calcium: 8.6 mg/dL — ABNORMAL LOW (ref 8.9–10.3)
Chloride: 100 mmol/L (ref 98–111)
Creatinine, Ser: 0.54 mg/dL (ref 0.44–1.00)
GFR calc Af Amer: >60 mL/min (ref >60)
GFR calc non Af Amer: >60 mL/min (ref >60)
Glucose, Bld: 102 mg/dL — ABNORMAL HIGH (ref 70–99)
Potassium: 3.3 mmol/L — ABNORMAL LOW (ref 3.5–5.1)
Sodium: 137 mmol/L (ref 135–145)

## 2019-11-06 LAB — CBC
HCT: 31.8 % — ABNORMAL LOW (ref 36.0–46.0)
Hemoglobin: 10.4 g/dL — ABNORMAL LOW (ref 12.0–15.0)
MCH: 30.8 pg (ref 26.0–34.0)
MCHC: 32.7 g/dL (ref 30.0–36.0)
MCV: 94.1 fL (ref 80.0–100.0)
Platelets: 173 10*3/uL (ref 150–400)
RBC: 3.38 MIL/uL — ABNORMAL LOW (ref 3.87–5.11)
RDW: 13.2 % (ref 11.5–15.5)
WBC: 7.2 10*3/uL (ref 4.0–10.5)
nRBC: 0 % (ref 0.0–0.2)

## 2019-11-06 LAB — POCT I-STAT, CHEM 8
BUN: 13 mg/dL (ref 8–23)
Calcium, Ion: 0.91 mmol/L — ABNORMAL LOW (ref 1.15–1.40)
Chloride: 104 mmol/L (ref 98–111)
Creatinine, Ser: 0.3 mg/dL — ABNORMAL LOW (ref 0.44–1.00)
Glucose, Bld: 85 mg/dL (ref 70–99)
HCT: 30 % — ABNORMAL LOW (ref 36.0–46.0)
Hemoglobin: 10.2 g/dL — ABNORMAL LOW (ref 12.0–15.0)
Potassium: 3.3 mmol/L — ABNORMAL LOW (ref 3.5–5.1)
Sodium: 142 mmol/L (ref 135–145)
TCO2: 23 mmol/L (ref 22–32)

## 2019-11-06 LAB — HEPARIN LEVEL (UNFRACTIONATED): Heparin Unfractionated: 0.42 IU/mL (ref 0.30–0.70)

## 2019-11-06 MED ORDER — APIXABAN 5 MG PO TABS: 5.0000 mg | ORAL_TABLET | Freq: Two times a day (BID) | ORAL | Status: DC

## 2019-11-06 MED ORDER — POTASSIUM CHLORIDE CRYS ER 20 MEQ PO TBCR
40.0000 meq | EXTENDED_RELEASE_TABLET | Freq: Once | ORAL | Status: AC
  Administered 2019-11-06: 40 meq via ORAL
  Filled 2019-11-06: qty 2

## 2019-11-06 MED ORDER — LOSARTAN POTASSIUM 25 MG PO TABS
12.5000 mg | ORAL_TABLET | Freq: Every day | ORAL | Status: DC
  Administered 2019-11-06 – 2019-11-08 (×3): 12.5 mg via ORAL
  Filled 2019-11-06 (×3): qty 0.5

## 2019-11-06 MED ORDER — APIXABAN 5 MG PO TABS
10.0000 mg | ORAL_TABLET | Freq: Two times a day (BID) | ORAL | Status: DC
  Administered 2019-11-06 – 2019-11-08 (×5): 10 mg via ORAL
  Filled 2019-11-06 (×5): qty 2

## 2019-11-06 NOTE — Progress Notes (Signed)
Lower venous duplex       has been completed. Preliminary results can be found under CV proc through chart review. Eugenio Dollins, BS, RDMS, RVT   

## 2019-11-06 NOTE — Progress Notes (Signed)
ANTICOAGULATION CONSULT NOTE - Initial Consult  Pharmacy Consult for Heparin Indication: Mutiple pulmonary emboli 11/05/19   Patient Measurements: Height: 5\' 6"  (167.6 cm) Weight: 69.8 kg (153 lb 14.1 oz) IBW/kg (Calculated) : 59.3 Heparin Dosing Weight: 68 kg  Vital Signs: Temp: 97.8 F (36.6 C) (04/14 0802) Temp Source: Oral (04/14 0802) BP: 100/55 (04/14 0802) Pulse Rate: 66 (04/14 0802)  Labs: Recent Labs    11/04/19 0439 11/05/19 2202 11/06/19 0257  HGB 10.2*  --  10.4*  HCT 31.7*  --  31.8*  PLT 169  --  173  HEPARINUNFRC  --  0.38 0.42  CREATININE 0.64  --  0.54    Estimated Creatinine Clearance: 48.1 mL/min (by C-G formula based on SCr of 0.54 mg/dL).  Assessment: 61 YOF who presented on 4/9 with a left distal femur fracture due to fall s/p repair on 4/9, post-op course complicated by hypotension requiring pressors, and 4/13 with imaging showing "multiple small bilateral segmental and subsegmental pulmonary emboli with evidence of RHS". Pharmacy consulted to start Heparin for anticoagulation.   HL 0.42 at goal. H/H, plt stable. No bleeding noted.   Goal of Therapy:  Heparin level 0.3-0.5 units/ml Monitor platelets by anticoagulation protocol: Yes   Plan:  Continue Heparin at 1100 units/hr  Monitor daily HL, CBC/plt Monitor for signs/symptoms of bleeding  F/u oral anticoagulation plan   Thank you for allowing pharmacy to be a part of this patient's care.  Benetta Spar, PharmD, BCPS, BCCP Clinical Pharmacist  Please check AMION for all [place] phone numbers After 10:00 PM, call [place] [phone]

## 2019-11-06 NOTE — Progress Notes (Signed)
Progress Note  Patient Name: [name] Date of Encounter: 11/06/2019  Primary Cardiologist: Sanda Klein, MD  Subjective   Doing well today. Denies chest pain or SOB.   Inpatient Medications    Scheduled Meds: . Chlorhexidine Gluconate Cloth  6 each Topical Daily  . feeding supplement (PRO-STAT SUGAR FREE 64)  30 mL Oral BID  . mouth rinse  15 mL Mouth Rinse BID  . metoprolol tartrate  12.5 mg Oral Once  . multivitamin with minerals  1 tablet Oral Daily  . rosuvastatin  20 mg Oral q1800   Continuous Infusions: . sodium chloride 250 mL (11/01/19 2105)  . heparin 1,100 Units/hr (11/05/19 1501)   PRN Meds: acetaminophen, docusate sodium, HYDROcodone-acetaminophen, metoCLOPramide **OR** metoCLOPramide (REGLAN) injection, morphine injection, polyethylene glycol   Vital Signs    Vitals:   11/05/19 0821 11/05/19 1921 11/06/19 0408 11/06/19 0802  BP: 118/69 120/70 127/83 (!) 100/55  Pulse: 79 82 85 66  Resp: 18 18  17   Temp: 98 F (36.7 C) 98.3 F (36.8 C) 97.6 F (36.4 C) 97.8 F (36.6 C)  TempSrc: Oral Oral Oral Oral  SpO2: 99% 100% 93% 94%  Weight:      Height:        Intake/Output Summary (Last 24 hours) at 11/06/2019 0804 Last data filed at 11/06/2019 [id number] Gross per 24 hour  Intake 7.84 ml  Output 450 ml  Net -442.16 ml   Filed Weights   11/01/19 0904 11/02/19 0500  Weight: 63.5 kg 67.9 kg    Physical Exam   General: Elderly, pleasant, NAD Neck: Negative for carotid bruits. No JVD Lungs:Clear to ausculation bilaterally. No wheezes, rales, or rhonchi. Breathing is unlabored. Cardiovascular: RRR with S1 S2. No murmurs Abdomen: Soft, non-tender, non-distended. No obvious abdominal masses. Extremities: No edema. Radial pulses 2+ bilaterally Neuro: Alert and oriented. No focal deficits. No facial asymmetry. MAE spontaneously. Psych: Responds to questions appropriately with normal affect.    Labs    Chemistry Recent Labs  Lab  10/31/19 2235 11/01/19 0430 11/02/19 0029 11/02/19 0029 11/03/19 0538 11/04/19 0439 11/06/19 0257  NA 140   < > 138   < > 136 136 137  K 3.2*   < > 3.8   < > 4.1 3.8 3.3*  CL 101   < > 102   < > 98 99 100  CO2 27   < > 24   < > 29 25 26   GLUCOSE 138*   < > 162*   < > 124* 112* 102*  BUN 21   < > 18   < > 26* 29* 22  CREATININE 0.59   < > 0.75   < > 0.79 0.64 0.54  CALCIUM 9.5   < > 8.7*   < > 9.0 8.7* 8.6*  PROT 7.1  --  5.9*  --   --   --   --   ALBUMIN 3.8  --  3.4*  --   --   --   --   AST 35  --  55*  --   --   --   --   ALT 28  --  37  --   --   --   --   ALKPHOS 64  --  58  --   --   --   --   BILITOT 0.8  --  1.4*  --   --   --   --   GFRNONAA >60   < > >  60   < > >60 >60 >60  GFRAA >60   < > >60   < > >60 >60 >60  ANIONGAP 12   < > 12   < > 9 12 11    < > = values in this interval not displayed.     Hematology Recent Labs  Lab 11/03/19 0538 11/04/19 0439 11/06/19 0257  WBC 8.4 7.2 7.2  RBC 3.33* 3.30* 3.38*  HGB 10.3* 10.2* 10.4*  HCT 32.1* 31.7* 31.8*  MCV 96.4 96.1 94.1  MCH 30.9 30.9 30.8  MCHC 32.1 32.2 32.7  RDW 13.3 13.2 13.2  PLT 160 169 173    Cardiac EnzymesNo results for input(s): TROPONINI in the last 168 hours. No results for input(s): TROPIPOC in the last 168 hours.   BNPNo results for input(s): BNP, PROBNP in the last 168 hours.   DDimer  Recent Labs  Lab 11/05/19 0818  DDIMER 6.09*     Radiology    CT ANGIO CHEST PE W OR WO CONTRAST  Addendum Date: 11/05/2019   ADDENDUM REPORT: 11/05/2019 15:40 ADDENDUM: Note the above findings were discussed with Dr. Dessa Phi at 12:58 p.m. Electronically Signed   By: Marin Olp M.D.   On: 11/05/2019 15:40   Result Date: 11/05/2019 CLINICAL DATA:  Shortness of breath.  Suspect PE.  Elevated D-dimer. EXAM: CT ANGIOGRAPHY CHEST WITH CONTRAST TECHNIQUE: Multidetector CT imaging of the chest was performed using the standard protocol during bolus administration of intravenous contrast. Multiplanar  CT image reconstructions and MIPs were obtained to evaluate the vascular anatomy. CONTRAST:  174mL OMNIPAQUE IOHEXOL 350 MG/ML SOLN COMPARISON:  10/19/2014 FINDINGS: Cardiovascular: There is mild-to-moderate cardiomegaly present. Mild calcified plaque over the left anterior descending coronary artery. Thoracic aorta demonstrates no evidence of aneurysm or dissection. Minimal calcified plaque over the descending thoracic aorta. Prominence of the main pulmonary artery segment as a pulmonary arterial system is well opacified. There multiple small segmental and subsegmental emboli bilaterally. Elevated RV/LV ratio compatible with right heart strain. Remaining vascular structures are unremarkable. Mediastinum/Nodes: No evidence of mediastinal or hilar adenopathy. Remaining mediastinal structures are unremarkable. Lungs/Pleura: Lungs are well inflated and demonstrate moderate right effusion and small left effusion with associated bibasilar atelectasis. Airways are normal. Upper Abdomen: Calcified plaque over the abdominal aorta. No acute findings. Musculoskeletal: Multiple spinal compression fractures without significant change with the exception of a moderate compression fracture over the lower thoracic spine age indeterminate but new since the previous exam. Review of the MIP images confirms the above findings. IMPRESSION: 1. Multiple small bilateral segmental and subsegmental pulmonary emboli. Evidence of right heart strain. Associated moderate right effusion and small left effusion with bibasilar atelectasis. 2.  Cardiomegaly. 3. Multiple thoracic and lumbar spine compression fractures without significant change. Moderate compression fracture over the lower thoracic spine new since the previous exam but age indeterminate. 4. Aortic Atherosclerosis (ICD10-I70.0). Atherosclerotic coronary artery disease. Electronically Signed: By: Marin Olp M.D. On: 11/05/2019 12:52   NM Myocar Multi W/Spect Tamela Oddi Motion /  EF  Result Date: 11/05/2019 CLINICAL DATA:  Ischemic cardiomyopathy.  Evaluate viability. EXAM: MYOCARDIAL IMAGING WITH SPECT (REST AND PHARMACOLOGIC-STRESS) GATED LEFT VENTRICULAR WALL MOTION STUDY LEFT VENTRICULAR EJECTION FRACTION TECHNIQUE: Standard myocardial SPECT imaging was performed after resting intravenous injection of 10 mCi Tc-35m tetrofosmin. Subsequently, intravenous infusion of Lexiscan was performed under the supervision of the Cardiology staff. At peak effect of the drug, 30 mCi Tc-41m tetrofosmin was injected intravenously and standard myocardial SPECT imaging was performed. Quantitative gated  imaging was also performed to evaluate left ventricular wall motion, and estimate left ventricular ejection fraction. COMPARISON:  None. FINDINGS: Perfusion: No rest defect. With stress, a medium size, moderate severity area of reversibility is identified within the apical to mid segment of the inferoseptal wall. Wall Motion: Normal left ventricular wall motion. No left ventricular dilation. Left Ventricular Ejection Fraction: 69 % End diastolic volume 69 ml End systolic volume 22 ml IMPRESSION: 1. Medium size, moderate severity area of reversibility within the apical to mid segment of the inferoseptal wall. This is suspicious for inducible ischemia. 2. Normal left ventricular wall motion. 3. Left ventricular ejection fraction 69% 4. Non invasive risk stratification*: Intermediate *2012 Appropriate Use Criteria for Coronary Revascularization Focused Update: J Am Coll Cardiol. [id number]. http://content.[email or web address].aspx?articleid=[email or web address] Electronically Signed   By: Abigail Miyamoto M.D.   On: 11/05/2019 15:51   Telemetry    11/06/19 NSR with competing atrial pacemaker - Personally Reviewed  ECG    No new tracing as of 11/06/19 - Personally Reviewed  Cardiac Studies   Echocardiogram  11/02/19  1. False tendon in LV apex of no clinical significance. Left ventricular  ejection  fraction, by estimation, is 40 to 45%. The left ventricle has  mildly decreased function. The left ventricle demonstrates global  hypokinesis. There is severe left  ventricular hypertrophy of the basal-septal segment. Left ventricular  diastolic parameters are consistent with Grade II diastolic dysfunction  (pseudonormalization). Elevated left ventricular end-diastolic pressure.  2. Right ventricular systolic function is moderately reduced. The right  ventricular size is mildly enlarged. There is moderately elevated  pulmonary artery systolic pressure.  3. Right atrial size was severely dilated.  4. The mitral valve is normal in structure. Trivial mitral valve  regurgitation. No evidence of mitral stenosis.  5. The aortic valve is tricuspid. Aortic valve regurgitation is mild.  Mild to moderate aortic valve sclerosis/calcification is present, without  any evidence of aortic stenosis.  6. Aortic dilatation noted. There is mild dilatation of the ascending  aorta measuring 41 mm.  7. The inferior vena cava is dilated in size with <50% respiratory  variability, suggesting right atrial pressure of 15 mmHg.    Patient Profile     86 y.o. female with history ofbreast cancertreated with lumpectomy and radiation (1995), paroxysmal atrial tachycardia, orthostatic hypotension, HLD,remote hx of DVT,frequent PVCs/NSVT by monitor 01/2018, chronic sinus bradycardia,prominent bilateral varicose veins/pbilateral saphenectomy, chronic venous insufficiency with LE edema. She was admitted 10/2019 with fall while carrying in groceries. She was on the ground for 5 hours (CK 272). She was found to have distal femur fracture that required ORIF. Post-op course notable for hypotension requiring pressors and IV fluids. This appears to have been felt related to hypovolemia, vasodilatory state, sedation.Her troponin was also noted to be elevated 3,180--> 3,672--> 3,375. TTE showed EF 40-45% withglobal  hypokinesis,severe LVH of the basal septal segment,G2 DD with elevatedLVEDP, moderately reduced RV systolic function with mild right ventricular enlargement,mild to moderate aortic valve sclerosis without aortic stenosis and mild dilatation of the ascending aorta at 41 mm.Cardiology consulted for elevated troponin.  Assessment & Plan    1. Elevated troponin>>presumed NSTEMI: -HST peaked at 3672 -Echocardiogram this admission with new LV dysfunction compared to prior.  -Nuclear stress found to have medium sized, moderate severity of reversibility within the apical to mid segment of the inferoseptal wall>>>suspicious for ischemia>>>plan per Dr. Mariea Clonts note after discussion with the patient is for medical management.  -Simvastatinwasswitched to rosuvastatin this admission -Will add ASA when  ok with surgical team given presumed ischemia  -If the patient is tolerating statin at time of follow-up appointment, would consider rechecking liver function/lipid panel in 6-8 weeks. -Denies chest pain   2. Pulmonary embolism: -Pt underwent chest CTA which showed multiple small bilateral segmental PE with evidence of RV strain -Placed on Heparin infusion -Plan for long oral AC per primary team   3. Mechanical fall with hip fracture: -s/p ORIF>>>managment per ortho team -Minimal pain on exam  4. New cardiomyopathy secondary to presumed ischemia: -Echo with results as above with subsequent stress testing which showed medium sized, moderate severity of reversibility within the apical to mid segment of the inferoseptal wall>>>suspicious for ischemia -Started on BB 11/05/19  -BP more stable today -Will add low dose ARB and monitor BP closely  -Continues to appear euvolemic on exam   4. H/o PAT, PVCs, NSVT: -K+, 3.3 today>>>will replace with 28meq now -BMET in AM  -Denies palpitations  -Repeat EKG today    5. Mild ascending aortic aneurysm: -Measuring 4.1cm.  -Needs OP management as  dictated by clinical status.   Signed, Kathyrn Drown NP-C [facility] Pager: [phone] 11/06/2019, 8:04 AM     For questions or updates, please contact   Please consult [email or web address] for contact info under Cardiology/STEMI.

## 2019-11-06 NOTE — Progress Notes (Addendum)
pt had 1 urine occurrence in bed, but bladder scan showed 621 mL still remaining. Notified Dr. Sharlet Salina for foley d/t in&out cath x2 within 24 hrs. Will continue to monitor.  4fr foley inserted @0600   Attempted to turn pt q2hrs, but d/t pain, pt works her way back on her left side.  Heels elevated off bed and foam dressings applied

## 2019-11-06 NOTE — Progress Notes (Signed)
ANTICOAGULATION CONSULT NOTE Pharmacy Consult for Heparin to Apixaban Indication: Mutiple pulmonary emboli 11/05/19   Patient Measurements: Height: 5\' 6"  (167.6 cm) Weight: 69.8 kg (153 lb 14.1 oz) IBW/kg (Calculated) : 59.3 Heparin Dosing Weight: 68 kg  Vital Signs: Temp: 97.8 F (36.6 C) (04/14 1447) Temp Source: Oral (04/14 1447) BP: 124/69 (04/14 1447) Pulse Rate: 81 (04/14 1447)  Labs: Recent Labs    11/04/19 0439 11/05/19 2202 11/06/19 0257  HGB 10.2*  --  10.4*  HCT 31.7*  --  31.8*  PLT 169  --  173  HEPARINUNFRC  --  0.38 0.42  CREATININE 0.64  --  0.54    Estimated Creatinine Clearance: 48.1 mL/min (by C-G formula based on SCr of 0.54 mg/dL).  Assessment: 55 YOF who presented on 4/9 with a left distal femur fracture due to fall s/p repair on 4/9, post-op course complicated by hypotension requiring pressors, and 4/13 with imaging showing "multiple small bilateral segmental and subsegmental pulmonary emboli with evidence of RHS". Pharmacy consulted to start Heparin for anticoagulation.   HL 0.42 at goal. H/H, plt stable. No bleeding noted.   PM: Heparin to Apixaban  Goal of Therapy:  Heparin level 0.3-0.5 units/ml Monitor platelets by anticoagulation protocol: Yes   Plan:  Apixaban 10 mg po BID x 7 days then 5 mg po BID DC heparin with first dose   Thank you for allowing pharmacy to be a part of this patient's care. Anette Guarneri, PharmD   Please check AMION for all [facility] phone numbers After 10:00 PM, call [place] [phone]

## 2019-11-06 NOTE — Progress Notes (Signed)
PROGRESS NOTE    [name]  [id number] DOB: 1932-02-18 DOA: 10/31/2019 PCP: Burnard Bunting, MD     Brief Narrative:  [name] is an 86 yo female with past medical history significant for HTN, NSVT, PVC, diastolic heart failure, history of left breast cancer, depression, osteoporosis, osteoarthritis who presented to the hospital with left leg pain. She had been grocery shopping, tripped on a rug and fell to the ground.  In the emergency department, she was found to have distal left femur fracture.  She was transferred from [facility] to [facility] for surgical intervention.  Postoperatively, she developed hypotension and was transferred to the ICU for pressors. BP improved and patient transferred to telemetry and under [facility] service on 4/11. Cardiology was consulted due to elevated troponin with new LV dysfunction. She underwent stress test on 4/12.    New events last 24 hours / Subjective: Patient seen and examined at bedside, denies any new complaints, denies any chest pain, shortness of breath, abdominal pain, nausea/vomiting, fever/chills.  Patient noted to be retaining urine, after subsequent In-N-Out cath, a Foley catheter was placed on 4/14 a.m.  Assessment & Plan:   Principal Problem:   Closed fracture of distal end of left femur, initial encounter ([place]) Active Problems:   Hypokalemia   Pure hypercholesterolemia   Essential hypertension, benign   Diastolic dysfunction without heart failure   Prolonged QT interval   Other fracture of left femur, initial encounter for closed fracture [facility])   Hip fracture (HCC)   Postprocedural hypotension   Non-ST elevation (NSTEMI) myocardial infarction [facility])   Distal left femur fracture s/p ORIF by Dr. Marcelino Scot on 11/01/2019 Further management per Ortho, pain control PT recommends SNF placement  NSTEMI -Peak troponin 3672 -Echocardiogram revealed EF 40 to 45% with global hypokinesis, severe left ventricular  hypertrophy, grade 2 diastolic dysfunction -Cardiology consulted, stress test found to have medium sized, moderate severity of reversibility within the apical to mid segments of the inferior septal wall suspicious for ischemia -Cardiology discussed extensively with patient, opted for medical management, switched PTA simvastatin to rosuvastatin, unable to tolerate BB/ARB due to soft BP, given age/frailty, would not start aspirin but once anticoagulation is complete for PE will resume 81 mg daily of aspirin.  Outpatient cardiology follow-up  Acute hypoxemic respiratory failure 2/2 acute PE -Elevated d-dimer 6.09  -Wells pre-test probability of PE 4.5, indicating moderate probability  -CTA chest showed multiple small bilateral segmental PE with evidence of RV strain -Continue Heparin drip, plan to switch to oral AC on 11/07/2019 -Currently on 2L [place] O2, plan to taper off -Lower extremity Doppler bilateral pending  Acute urinary retention -Required subsequent in and out cath starting 4/12, Foley placed on 11/06/2019 -Continue Foley catheter for now  Postop hypotension -Thought to be sedation related, now off pressor and stable blood pressure  Essential hypertension -Hold home Maxide as BP soft  Chronic diastolic heart failure -Appears euvolemic  HLD -Continue crestor     DVT prophylaxis: Heparin drip Code Status: Full Family Communication: Discussed extensively with patient Disposition Plan:  Status is: Inpatient  Remains inpatient appropriate because: Need for IV Heparin   Dispo: The patient is from: Home              Anticipated d/c is to: SNF              Anticipated d/c date is: 11/07/2019              Patient currently  is not medically stable to d/c.    Consultants:   Orthopedic surgery  PCCM  Cardiology   Antimicrobials:  Anti-infectives (From admission, onward)   Start     Dose/Rate Route Frequency Ordered Stop   11/01/19 2100  ceFAZolin (ANCEF) IVPB 1 g/50 mL  premix     1 g 100 mL/hr over 30 Minutes Intravenous Every 6 hours 11/01/19 2037 11/02/19 1646   11/01/19 1145  ceFAZolin (ANCEF) IVPB 2g/100 mL premix     2 g 200 mL/hr over 30 Minutes Intravenous On call to O.R. 11/01/19 1141 11/01/19 1533       Objective: Vitals:   11/05/19 1921 11/06/19 0408 11/06/19 0802 11/06/19 1100  BP: 120/70 127/83 (!) 100/55   Pulse: 82 85 66   Resp: 18  17   Temp: 98.3 F (36.8 C) 97.6 F (36.4 C) 97.8 F (36.6 C)   TempSrc: Oral Oral Oral   SpO2: 100% 93% 94%   Weight:    69.8 kg  Height:        Intake/Output Summary (Last 24 hours) at 11/06/2019 1358 Last data filed at 11/06/2019 [id number] Gross per 24 hour  Intake 7.84 ml  Output 450 ml  Net -442.16 ml   Filed Weights   11/01/19 0904 11/02/19 0500 11/06/19 1100  Weight: 63.5 kg 67.9 kg 69.8 kg    Examination:  General: NAD   Cardiovascular: S1, S2 present  Respiratory: CTAB  Abdomen: Soft, nontender, nondistended, bowel sounds present  Musculoskeletal: No bilateral pedal edema noted  Skin: Normal  Psychiatry: Normal mood    Data Reviewed: I have personally reviewed following labs and imaging studies  CBC: Recent Labs  Lab 11/01/19 1914 11/02/19 0029 11/03/19 0538 11/04/19 0439 11/06/19 0257  WBC 11.9* 11.9* 8.4 7.2 7.2  NEUTROABS  --  9.7*  --   --   --   HGB 11.7* 12.0 10.3* 10.2* 10.4*  HCT 36.6 37.0 32.1* 31.7* 31.8*  MCV 96.8 96.6 96.4 96.1 94.1  PLT 190 244 160 169 [id number]   Basic Metabolic Panel: Recent Labs  Lab 11/01/19 0430 11/01/19 0430 11/01/19 1612 11/01/19 1612 11/01/19 1914 11/02/19 0029 11/03/19 0538 11/04/19 0439 11/06/19 0257  NA 141   < > 142  --   --  138 136 136 137  K 3.0*   < > 3.3*  --   --  3.8 4.1 3.8 3.3*  CL 103   < > 104  --   --  102 98 99 100  CO2 26  --   --   --   --  24 29 25 26   GLUCOSE 114*   < > 85  --   --  162* 124* 112* 102*  BUN 18   < > 13  --   --  18 26* 29* 22  CREATININE 0.51   < > 0.30*   < > 0.72 0.75 0.79  0.64 0.54  CALCIUM 8.2*  --   --   --   --  8.7* 9.0 8.7* 8.6*  MG 1.3*  --   --   --   --  1.6*  --   --   --   PHOS  --   --   --   --   --  3.5  --   --   --    < > = values in this interval not displayed.   GFR: Estimated Creatinine Clearance: 48.1 mL/min (by C-G formula based on SCr of  0.54 mg/dL). Liver Function Tests: Recent Labs  Lab 10/31/19 2235 11/02/19 0029  AST 35 55*  ALT 28 37  ALKPHOS 64 58  BILITOT 0.8 1.4*  PROT 7.1 5.9*  ALBUMIN 3.8 3.4*   No results for input(s): LIPASE, AMYLASE in the last 168 hours. No results for input(s): AMMONIA in the last 168 hours. Coagulation Profile: No results for input(s): INR, PROTIME in the last 168 hours. Cardiac Enzymes: Recent Labs  Lab 10/31/19 2235 11/02/19 0029  CKTOTAL 272* 243*   BNP (last 3 results) No results for input(s): PROBNP in the last 8760 hours. HbA1C: No results for input(s): HGBA1C in the last 72 hours. CBG: No results for input(s): GLUCAP in the last 168 hours. Lipid Profile: Recent Labs    11/04/19 0439  CHOL 133  HDL 44  LDLCALC 69  TRIG 102  CHOLHDL 3.0   Thyroid Function Tests: No results for input(s): TSH, T4TOTAL, FREET4, T3FREE, THYROIDAB in the last 72 hours. Anemia Panel: No results for input(s): VITAMINB12, FOLATE, FERRITIN, TIBC, IRON, RETICCTPCT in the last 72 hours. Sepsis Labs: No results for input(s): PROCALCITON, LATICACIDVEN in the last 168 hours.  Recent Results (from the past 240 hour(s))  SARS CORONAVIRUS 2 (TAT 6-24 HRS) Nasopharyngeal Nasopharyngeal Swab     Status: None   Collection Time: 10/31/19 11:25 PM   Specimen: Nasopharyngeal Swab  Result Value Ref Range Status   SARS Coronavirus 2 NEGATIVE NEGATIVE Final    Comment: (NOTE) SARS-CoV-2 target nucleic acids are NOT DETECTED. The SARS-CoV-2 RNA is generally detectable in upper and lower respiratory specimens during the acute phase of infection. Negative results do not preclude SARS-CoV-2 infection, do not  rule out co-infections with other pathogens, and should not be used as the sole basis for treatment or other patient management decisions. Negative results must be combined with clinical observations, patient history, and epidemiological information. The expected result is Negative. Fact Sheet for Patients: SugarRoll.be Fact Sheet for Healthcare Providers: https://www.woods-mathews.com/ This test is not yet approved or cleared by the [place] FDA and  has been authorized for detection and/or diagnosis of SARS-CoV-2 by FDA under an Emergency Use Authorization (EUA). This EUA will remain  in effect (meaning this test can be used) for the duration of the COVID-19 declaration under Section 56 4(b)(1) of the Act, 21 U.S.C. section 360bbb-3(b)(1), unless the authorization is terminated or revoked sooner. Performed at [place] [facility], [place] [address] [place], [place] [address]   MRSA PCR Screening     Status: None   Collection Time: 11/01/19  9:26 PM   Specimen: Nasal Mucosa; Nasopharyngeal  Result Value Ref Range Status   MRSA by PCR NEGATIVE NEGATIVE Final    Comment:        The GeneXpert MRSA Assay (FDA approved for NASAL specimens only), is one component of a comprehensive MRSA colonization surveillance program. It is not intended to diagnose MRSA infection nor to guide or monitor treatment for MRSA infections. Performed at [facility], [place] [address], [place], [place] [address]   SARS CORONAVIRUS 2 (TAT 6-24 HRS) Nasopharyngeal Nasopharyngeal Swab     Status: None   Collection Time: 11/04/19  7:08 PM   Specimen: Nasopharyngeal Swab  Result Value Ref Range Status   SARS Coronavirus 2 NEGATIVE NEGATIVE Final    Comment: (NOTE) SARS-CoV-2 target nucleic acids are NOT DETECTED. The SARS-CoV-2 RNA is generally detectable in upper and lower respiratory specimens during the acute phase of infection. Negative results do  not  preclude SARS-CoV-2 infection, do not rule out co-infections with other pathogens, and should not be used as the sole basis for treatment or other patient management decisions. Negative results must be combined with clinical observations, patient history, and epidemiological information. The expected result is Negative. Fact Sheet for Patients: SugarRoll.be Fact Sheet for Healthcare Providers: https://www.woods-mathews.com/ This test is not yet approved or cleared by the [place] FDA and  has been authorized for detection and/or diagnosis of SARS-CoV-2 by FDA under an Emergency Use Authorization (EUA). This EUA will remain  in effect (meaning this test can be used) for the duration of the COVID-19 declaration under Section 56 4(b)(1) of the Act, 21 U.S.C. section 360bbb-3(b)(1), unless the authorization is terminated or revoked sooner. Performed at [facility], [place] [address] [place], [place] [address]       Radiology Studies: CT ANGIO CHEST PE W OR WO CONTRAST  Addendum Date: 11/05/2019   ADDENDUM REPORT: 11/05/2019 15:40 ADDENDUM: Note the above findings were discussed with Dr. Dessa Phi at 12:58 p.m. Electronically Signed   By: Marin Olp M.D.   On: 11/05/2019 15:40   Result Date: 11/05/2019 CLINICAL DATA:  Shortness of breath.  Suspect PE.  Elevated D-dimer. EXAM: CT ANGIOGRAPHY CHEST WITH CONTRAST TECHNIQUE: Multidetector CT imaging of the chest was performed using the standard protocol during bolus administration of intravenous contrast. Multiplanar CT image reconstructions and MIPs were obtained to evaluate the vascular anatomy. CONTRAST:  134mL OMNIPAQUE IOHEXOL 350 MG/ML SOLN COMPARISON:  10/19/2014 FINDINGS: Cardiovascular: There is mild-to-moderate cardiomegaly present. Mild calcified plaque over the left anterior descending coronary artery. Thoracic aorta demonstrates no evidence of aneurysm or dissection. Minimal  calcified plaque over the descending thoracic aorta. Prominence of the main pulmonary artery segment as a pulmonary arterial system is well opacified. There multiple small segmental and subsegmental emboli bilaterally. Elevated RV/LV ratio compatible with right heart strain. Remaining vascular structures are unremarkable. Mediastinum/Nodes: No evidence of mediastinal or hilar adenopathy. Remaining mediastinal structures are unremarkable. Lungs/Pleura: Lungs are well inflated and demonstrate moderate right effusion and small left effusion with associated bibasilar atelectasis. Airways are normal. Upper Abdomen: Calcified plaque over the abdominal aorta. No acute findings. Musculoskeletal: Multiple spinal compression fractures without significant change with the exception of a moderate compression fracture over the lower thoracic spine age indeterminate but new since the previous exam. Review of the MIP images confirms the above findings. IMPRESSION: 1. Multiple small bilateral segmental and subsegmental pulmonary emboli. Evidence of right heart strain. Associated moderate right effusion and small left effusion with bibasilar atelectasis. 2.  Cardiomegaly. 3. Multiple thoracic and lumbar spine compression fractures without significant change. Moderate compression fracture over the lower thoracic spine new since the previous exam but age indeterminate. 4. Aortic Atherosclerosis (ICD10-I70.0). Atherosclerotic coronary artery disease. Electronically Signed: By: Marin Olp M.D. On: 11/05/2019 12:52   NM Myocar Multi W/Spect Tamela Oddi Motion / EF  Result Date: 11/05/2019 CLINICAL DATA:  Ischemic cardiomyopathy.  Evaluate viability. EXAM: MYOCARDIAL IMAGING WITH SPECT (REST AND PHARMACOLOGIC-STRESS) GATED LEFT VENTRICULAR WALL MOTION STUDY LEFT VENTRICULAR EJECTION FRACTION TECHNIQUE: Standard myocardial SPECT imaging was performed after resting intravenous injection of 10 mCi Tc-68m tetrofosmin. Subsequently,  intravenous infusion of Lexiscan was performed under the supervision of the Cardiology staff. At peak effect of the drug, 30 mCi Tc-11m tetrofosmin was injected intravenously and standard myocardial SPECT imaging was performed. Quantitative gated imaging was also performed to evaluate left ventricular wall motion, and estimate left ventricular ejection fraction. COMPARISON:  None. FINDINGS: Perfusion: No rest defect. With stress, a medium size, moderate severity area of reversibility is identified within the apical to mid segment of the inferoseptal wall. Wall Motion: Normal left ventricular wall motion. No left ventricular dilation. Left Ventricular Ejection Fraction: 69 % End diastolic volume 69 ml End systolic volume 22 ml IMPRESSION: 1. Medium size, moderate severity area of reversibility within the apical to mid segment of the inferoseptal wall. This is suspicious for inducible ischemia. 2. Normal left ventricular wall motion. 3. Left ventricular ejection fraction 69% 4. Non invasive risk stratification*: Intermediate *2012 Appropriate Use Criteria for Coronary Revascularization Focused Update: J Am Coll Cardiol. [id number]. http://content.airportbarriers.com.aspx?articleid=[email or web address] Electronically Signed   By: Abigail Miyamoto M.D.   On: 11/05/2019 15:51      Scheduled Meds: . Chlorhexidine Gluconate Cloth  6 each Topical Daily  . feeding supplement (PRO-STAT SUGAR FREE 64)  30 mL Oral BID  . losartan  12.5 mg Oral Daily  . mouth rinse  15 mL Mouth Rinse BID  . multivitamin with minerals  1 tablet Oral Daily  . rosuvastatin  20 mg Oral q1800   Continuous Infusions: . sodium chloride 250 mL (11/01/19 2105)  . heparin 1,100 Units/hr (11/06/19 1351)     LOS: 5 days      Alma Friendly, MD [facility] 11/06/2019, 1:58 PM   Available via Epic secure chat 7am-7pm After these hours, please refer to coverage provider listed on [email or web address]

## 2019-11-07 LAB — CBC WITH DIFFERENTIAL/PLATELET
Abs Immature Granulocytes: 0.04 10*3/uL (ref 0.00–0.07)
Basophils Absolute: 0 10*3/uL (ref 0.0–0.1)
Basophils Relative: 1 %
Eosinophils Absolute: 0.1 10*3/uL (ref 0.0–0.5)
Eosinophils Relative: 2 %
HCT: 33.5 % — ABNORMAL LOW (ref 36.0–46.0)
Hemoglobin: 11.1 g/dL — ABNORMAL LOW (ref 12.0–15.0)
Immature Granulocytes: 1 %
Lymphocytes Relative: 16 %
Lymphs Abs: 1.1 10*3/uL (ref 0.7–4.0)
MCH: 31.2 pg (ref 26.0–34.0)
MCHC: 33.1 g/dL (ref 30.0–36.0)
MCV: 94.1 fL (ref 80.0–100.0)
Monocytes Absolute: 0.8 10*3/uL (ref 0.1–1.0)
Monocytes Relative: 12 %
Neutro Abs: 4.6 10*3/uL (ref 1.7–7.7)
Neutrophils Relative %: 68 %
Platelets: 218 10*3/uL (ref 150–400)
RBC: 3.56 MIL/uL — ABNORMAL LOW (ref 3.87–5.11)
RDW: 13.4 % (ref 11.5–15.5)
WBC: 6.6 10*3/uL (ref 4.0–10.5)
nRBC: 0 % (ref 0.0–0.2)

## 2019-11-07 LAB — BASIC METABOLIC PANEL
Anion gap: 8 (ref 5–15)
BUN: 25 mg/dL — ABNORMAL HIGH (ref 8–23)
CO2: 29 mmol/L (ref 22–32)
Calcium: 8.7 mg/dL — ABNORMAL LOW (ref 8.9–10.3)
Chloride: 102 mmol/L (ref 98–111)
Creatinine, Ser: 0.58 mg/dL (ref 0.44–1.00)
GFR calc Af Amer: >60 mL/min (ref >60)
GFR calc non Af Amer: >60 mL/min (ref >60)
Glucose, Bld: 102 mg/dL — ABNORMAL HIGH (ref 70–99)
Potassium: 3.5 mmol/L (ref 3.5–5.1)
Sodium: 139 mmol/L (ref 135–145)

## 2019-11-07 MED ORDER — PHENYLEPHRINE HCL-NACL 10-0.9 MG/250ML-% IV SOLN
INTRAVENOUS | Status: AC
  Filled 2019-11-07: qty 250

## 2019-11-07 MED ORDER — SENNOSIDES-DOCUSATE SODIUM 8.6-50 MG PO TABS
1.0000 | ORAL_TABLET | Freq: Two times a day (BID) | ORAL | Status: DC
  Administered 2019-11-07 – 2019-11-08 (×3): 1 via ORAL
  Filled 2019-11-07 (×3): qty 1

## 2019-11-07 MED ORDER — POLYETHYLENE GLYCOL 3350 17 G PO PACK
17.0000 g | PACK | Freq: Two times a day (BID) | ORAL | Status: DC
  Administered 2019-11-07: 17 g via ORAL
  Filled 2019-11-07 (×2): qty 1

## 2019-11-07 NOTE — Discharge Instructions (Signed)
Information on my medicine - ELIQUIS (apixaban)  This medication education was reviewed with me or my healthcare representative as part of my discharge preparation.    Why was Eliquis prescribed for you? Eliquis was prescribed to treat blood clots that may have been found in the veins of your legs (deep vein thrombosis) or in your lungs (pulmonary embolism) and to reduce the risk of them occurring again.  What do You need to know about Eliquis ? The starting dose is 10 mg (two 5 mg tablets) taken TWICE daily for the FIRST SEVEN (7) DAYS, then on (enter date)  11/13/19  the dose is reduced to ONE 5 mg tablet taken TWICE daily.  Eliquis may be taken with or without food.   Try to take the dose about the same time in the morning and in the evening. If you have difficulty swallowing the tablet whole please discuss with your pharmacist how to take the medication safely.  Take Eliquis exactly as prescribed and DO NOT stop taking Eliquis without talking to the doctor who prescribed the medication.  Stopping may increase your risk of developing a new blood clot.  Refill your prescription before you run out.  After discharge, you should have regular check-up appointments with your healthcare provider that is prescribing your Eliquis.    What do you do if you miss a dose? If a dose of ELIQUIS is not taken at the scheduled time, take it as soon as possible on the same day and twice-daily administration should be resumed. The dose should not be doubled to make up for a missed dose.  Important Safety Information A possible side effect of Eliquis is bleeding. You should call your healthcare provider right away if you experience any of the following: ? Bleeding from an injury or your nose that does not stop. ? Unusual colored urine (red or dark brown) or unusual colored stools (red or black). ? Unusual bruising for unknown reasons. ? A serious fall or if you hit your head (even if there is no  bleeding).  Some medicines may interact with Eliquis and might increase your risk of bleeding or clotting while on Eliquis. To help avoid this, consult your healthcare provider or pharmacist prior to using any new prescription or non-prescription medications, including herbals, vitamins, non-steroidal anti-inflammatory drugs (NSAIDs) and supplements.  This website has more information on Eliquis (apixaban): http://www.eliquis.com/eliquis/home ============================================== Pulmonary Embolism    A pulmonary embolism (PE) is a sudden blockage or decrease of blood flow in one or both lungs. Most blockages come from a blood clot that forms in the vein of a lower leg, thigh, or arm (deep vein thrombosis, DVT) and travels to the lungs. A clot is blood that has thickened into a gel or solid. PE is a dangerous and life-threatening condition that needs to be treated right away.  What are the causes? This condition is usually caused by a blood clot that forms in a vein and moves to the lungs. In rare cases, it may be caused by air, fat, part of a tumor, or other tissue that moves through the veins and into the lungs.  What increases the risk? The following factors may make you more likely to develop this condition: 1. Experiencing a traumatic injury, such as breaking a hip or leg. 2. Having: ? A spinal cord injury. ? Orthopedic surgery, especially hip or knee replacement. ? Any major surgery. ? A stroke. ? DVT. ? Blood clots or blood clotting disease. ? Long-term (  chronic) lung or heart disease. ? Cancer treated with chemotherapy. ? A central venous catheter. 3. Taking medicines that contain estrogen. These include birth control pills and hormone replacement therapy. 4. Being: ? Pregnant. ? In the period of time after your baby is delivered (postpartum). ? Older than age 34. ? Overweight. ? A smoker, especially if you have other risks.  What are the signs or  symptoms? Symptoms of this condition usually start suddenly and include:  Shortness of breath during activity or at rest.  Coughing, coughing up blood, or coughing up blood-tinged mucus.  Chest pain that is often worse with deep breaths.  Rapid or irregular heartbeat.  Feeling light-headed or dizzy.  Fainting.  Feeling anxious.  Fever.  Sweating.  Pain and swelling in a leg. This is a symptom of DVT, which can lead to PE. How is this diagnosed? This condition may be diagnosed based on:  Your medical history.  A physical exam.  Blood tests.  CT pulmonary angiogram. This test checks blood flow in and around your lungs.  Ventilation-perfusion scan, also called a lung VQ scan. This test measures air flow and blood flow to the lungs.  An ultrasound of the legs.  How is this treated? Treatment for this condition depends on many factors, such as the cause of your PE, your risk for bleeding or developing more clots, and other medical conditions you have. Treatment aims to remove, dissolve, or stop blood clots from forming or growing larger. Treatment may include: 1. Medicines, such as: ? Blood thinning medicines (anticoagulants) to stop clots from forming. ? Medicines that dissolve clots (thrombolytics). 2. Procedures, such as: ? Using a flexible tube to remove a blood clot (embolectomy) or to deliver medicine to destroy it (catheter-directed thrombolysis). ? Inserting a filter into a large vein that carries blood to the heart (inferior vena cava). This filter (vena cava filter) catches blood clots before they reach the lungs. ? Surgery to remove the clot (surgical embolectomy). This is rare. You may need a combination of immediate, long-term (up to 3 months after diagnosis), and extended (more than 3 months after diagnosis) treatments. Your treatment may continue for several months (maintenance therapy). You and your health care provider will work together to choose the  treatment program that is best for you.  Follow these instructions at home: Medicines 1. Take over-the-counter and prescription medicines only as told by your health care provider. 2. If you are taking an anticoagulant medicine: ? Take the medicine every day at the same time each day. ? Understand what foods and drugs interact with your medicine. ? Understand the side effects of this medicine, including excessive bruising or bleeding. Ask your health care provider or pharmacist about other side effects.  General instructions  Wear a medical alert bracelet or carry a medical alert card that says you have had a PE and lists what medicines you take.  Ask your health care provider when you may return to your normal activities. Avoid sitting or lying for a long time without moving.  Maintain a healthy weight. Ask your health care provider what weight is healthy for you.  Do not use any products that contain nicotine or tobacco, such as cigarettes, e-cigarettes, and chewing tobacco. If you need help quitting, ask your health care provider.  Talk with your health care provider about any travel plans. It is important to make sure that you are still able to take your medicine while on trips.  Keep all follow-up visits  as told by your health care provider. This is important.  Contact a health care provider if:  You missed a dose of your blood thinner medicine.  Get help right away if: 1. You have: ? New or increased pain, swelling, warmth, or redness in an arm or leg. ? Numbness or tingling in an arm or leg. ? Shortness of breath during activity or at rest. ? A fever. ? Chest pain. ? A rapid or irregular heartbeat. ? A severe headache. ? Vision changes. ? A serious fall or accident, or you hit your head. ? Stomach (abdominal) pain. ? Blood in your vomit, stool, or urine. ? A cut that will not stop bleeding. 2. You cough up blood. 3. You feel light-headed or dizzy. 4. You cannot move  your arms or legs. 5. You are confused or have memory loss.  These symptoms may represent a serious problem that is an emergency. Do not wait to see if the symptoms will go away. Get medical help right away. Call your local emergency services (911 in the [place] Do not drive yourself to the hospital. Summary  A pulmonary embolism (PE) is a sudden blockage or decrease of blood flow in one or both lungs. PE is a dangerous and life-threatening condition that needs to be treated right away.  Treatments for this condition usually include medicines to thin your blood (anticoagulants) or medicines to break apart blood clots (thrombolytics).  If you are given blood thinners, it is important to take the medicine every day at the same time each day.  Understand what foods and drugs interact with any medicines that you are taking.  If you have signs of PE or DVT, call your local emergency services (911 in the [place] This information is not intended to replace advice given to you by your health care provider. Make sure you discuss any questions you have with your health care provider. Document Revised: 04/18/2018 Document Reviewed: 04/18/2018 Elsevier Patient Education  2020 [employer].

## 2019-11-07 NOTE — Progress Notes (Signed)
Consulted with Pharmacy for clarification concerning Hep gtt with continued infusion during change of shift bedside report. Call back from L. [facility]. Per L. Powell, heparin gtt should have been discontinued much earlier on day shift.  Heparin gtt discontinued and eliquis (anticoagulation bridge) given to patient(see Mar).  Patient with continued telemetry monitoring. Tele Tech Sonya with call  To give telemetry report. See flow chart.

## 2019-11-07 NOTE — Progress Notes (Signed)
Tele tech [place] with call back. After review of patient's tele strip at 0029 11/06/19 noted possible 2nd degree AVB. No other abberant beats noted pt vitals stable at this time. Patient did not remain in this rhythm , Back to NSR with BBB and occasional PVC's. Bed exit alarm maintained, phone and call bell in reach.

## 2019-11-07 NOTE — Progress Notes (Signed)
PROGRESS NOTE    [name]  [id number] DOB: July 01, 1932 DOA: 10/31/2019 PCP: Burnard Bunting, MD     Brief Narrative:  [name] is an 86 yo female with past medical history significant for HTN, NSVT, PVC, diastolic heart failure, history of left breast cancer, depression, osteoporosis, osteoarthritis who presented to the hospital with left leg pain. She had been grocery shopping, tripped on a rug and fell to the ground.  In the emergency department, she was found to have distal left femur fracture.  She was transferred from [facility] to [facility] for surgical intervention.  Postoperatively, she developed hypotension and was transferred to the ICU for pressors. BP improved and patient transferred to telemetry and under [facility] service on 4/11. Cardiology was consulted due to elevated troponin with new LV dysfunction. She underwent stress test on 4/12.    New events last 24 hours / Subjective: Patient seen and examined at bedside.  Reports some intermittent mild pain around surgical site.  Still with Foley, plan to do a voiding trial.  Reports no BM since admission.  Denies any chest pain, worsening shortness of breath, abdominal pain, nausea/vomiting, fever/chills.  Assessment & Plan:   Principal Problem:   Closed fracture of distal end of left femur, initial encounter ([place]) Active Problems:   Hypokalemia   Pure hypercholesterolemia   Essential hypertension, benign   Diastolic dysfunction without heart failure   Prolonged QT interval   Other fracture of left femur, initial encounter for closed fracture [facility])   Hip fracture (HCC)   Postprocedural hypotension   Non-ST elevation (NSTEMI) myocardial infarction [facility])   Distal left femur fracture s/p ORIF by Dr. Marcelino Scot on 11/01/2019 Further management per Ortho, pain control PT recommends SNF placement  NSTEMI -Peak troponin 3672 -Echocardiogram revealed EF 40 to 45% with global hypokinesis, severe left  ventricular hypertrophy, grade 2 diastolic dysfunction -Cardiology consulted, stress test found to have medium sized, moderate severity of reversibility within the apical to mid segments of the inferior septal wall suspicious for ischemia -Cardiology discussed extensively with patient, opted for medical management, switched PTA simvastatin to rosuvastatin, unable to tolerate BB/ARB due to soft BP, given age/frailty, would not start aspirin but once anticoagulation is complete for PE will resume 81 mg daily of aspirin.  Outpatient cardiology follow-up  Acute hypoxemic respiratory failure 2/2 acute PE -Elevated d-dimer 6.09  -Wells pre-test probability of PE 4.5, indicating moderate probability  -CTA chest showed multiple small bilateral segmental PE with evidence of RV strain -Switched to [employer] -Currently on room air -Lower extremity Doppler bilateral negative for DVT  Acute urinary retention -Required subsequent in and out cath starting 4/12, Foley placed on 11/06/2019 -Plan to DC Foley, start voiding trial  Postop hypotension -Thought to be sedation related, now off pressor and stable blood pressure  Essential hypertension -Hold home Maxide as BP soft  Chronic diastolic heart failure -Appears euvolemic  HLD -Continue crestor  Constipation No BM since admission as per patient Bowel regimen     DVT prophylaxis: Eliquis Code Status: Full Family Communication: Discussed extensively with patient Disposition Plan:  Status is: Inpatient   Dispo: The patient is from: Home              Anticipated d/c is to: SNF              Anticipated d/c date is: 11/08/2019              Patient currently is not medically stable  to d/c.    Consultants:   Orthopedic surgery  PCCM  Cardiology   Antimicrobials:  Anti-infectives (From admission, onward)   Start     Dose/Rate Route Frequency Ordered Stop   11/01/19 2100  ceFAZolin (ANCEF) IVPB 1 g/50 mL premix     1 g 100 mL/hr  over 30 Minutes Intravenous Every 6 hours 11/01/19 2037 11/02/19 1646   11/01/19 1145  ceFAZolin (ANCEF) IVPB 2g/100 mL premix     2 g 200 mL/hr over 30 Minutes Intravenous On call to O.R. 11/01/19 1141 11/01/19 1533       Objective: Vitals:   11/06/19 2010 11/07/19 0306 11/07/19 0742 11/07/19 1412  BP: 109/63 103/60 124/60 126/63  Pulse: 73 (!) 58 67 79  Resp: 17 16 15 16   Temp: 97.6 F (36.4 C)  97.9 F (36.6 C) 98.1 F (36.7 C)  TempSrc: Oral  Oral Oral  SpO2: 93% 94% 98% 94%  Weight:   69.1 kg   Height:        Intake/Output Summary (Last 24 hours) at 11/07/2019 1640 Last data filed at 11/07/2019 1408 Gross per 24 hour  Intake 360 ml  Output 600 ml  Net -240 ml   Filed Weights   11/02/19 0500 11/06/19 1100 11/07/19 0742  Weight: 67.9 kg 69.8 kg 69.1 kg    Examination:  General: NAD   Cardiovascular: S1, S2 present  Respiratory: CTAB  Abdomen: Soft, nontender, nondistended, bowel sounds present  Musculoskeletal: No bilateral pedal edema noted, left thigh dressing intact  Skin: Normal  Psychiatry: Normal mood    Data Reviewed: I have personally reviewed following labs and imaging studies  CBC: Recent Labs  Lab 11/02/19 0029 11/03/19 0538 11/04/19 0439 11/06/19 0257 11/07/19 1000  WBC 11.9* 8.4 7.2 7.2 6.6  NEUTROABS 9.7*  --   --   --  4.6  HGB 12.0 10.3* 10.2* 10.4* 11.1*  HCT 37.0 32.1* 31.7* 31.8* 33.5*  MCV 96.6 96.4 96.1 94.1 94.1  PLT 244 160 169 173 [id number]   Basic Metabolic Panel: Recent Labs  Lab 11/01/19 0430 11/01/19 1612 11/02/19 0029 11/03/19 0538 11/04/19 0439 11/06/19 0257 11/07/19 0449  NA 141   < > 138 136 136 137 139  K 3.0*   < > 3.8 4.1 3.8 3.3* 3.5  CL 103   < > 102 98 99 100 102  CO2 26  --  24 29 25 26 29   GLUCOSE 114*   < > 162* 124* 112* 102* 102*  BUN 18   < > 18 26* 29* 22 25*  CREATININE 0.51   < > 0.75 0.79 0.64 0.54 0.58  CALCIUM 8.2*  --  8.7* 9.0 8.7* 8.6* 8.7*  MG 1.3*  --  1.6*  --   --   --   --     PHOS  --   --  3.5  --   --   --   --    < > = values in this interval not displayed.   GFR: Estimated Creatinine Clearance: 48.1 mL/min (by C-G formula based on SCr of 0.58 mg/dL). Liver Function Tests: Recent Labs  Lab 10/31/19 2235 11/02/19 0029  AST 35 55*  ALT 28 37  ALKPHOS 64 58  BILITOT 0.8 1.4*  PROT 7.1 5.9*  ALBUMIN 3.8 3.4*   No results for input(s): LIPASE, AMYLASE in the last 168 hours. No results for input(s): AMMONIA in the last 168 hours. Coagulation Profile: No results for input(s): INR, PROTIME  in the last 168 hours. Cardiac Enzymes: Recent Labs  Lab 10/31/19 2235 11/02/19 0029  CKTOTAL 272* 243*   BNP (last 3 results) No results for input(s): PROBNP in the last 8760 hours. HbA1C: No results for input(s): HGBA1C in the last 72 hours. CBG: No results for input(s): GLUCAP in the last 168 hours. Lipid Profile: No results for input(s): CHOL, HDL, LDLCALC, TRIG, CHOLHDL, LDLDIRECT in the last 72 hours. Thyroid Function Tests: No results for input(s): TSH, T4TOTAL, FREET4, T3FREE, THYROIDAB in the last 72 hours. Anemia Panel: No results for input(s): VITAMINB12, FOLATE, FERRITIN, TIBC, IRON, RETICCTPCT in the last 72 hours. Sepsis Labs: No results for input(s): PROCALCITON, LATICACIDVEN in the last 168 hours.  Recent Results (from the past 240 hour(s))  SARS CORONAVIRUS 2 (TAT 6-24 HRS) Nasopharyngeal Nasopharyngeal Swab     Status: None   Collection Time: 10/31/19 11:25 PM   Specimen: Nasopharyngeal Swab  Result Value Ref Range Status   SARS Coronavirus 2 NEGATIVE NEGATIVE Final    Comment: (NOTE) SARS-CoV-2 target nucleic acids are NOT DETECTED. The SARS-CoV-2 RNA is generally detectable in upper and lower respiratory specimens during the acute phase of infection. Negative results do not preclude SARS-CoV-2 infection, do not rule out co-infections with other pathogens, and should not be used as the sole basis for treatment or other patient  management decisions. Negative results must be combined with clinical observations, patient history, and epidemiological information. The expected result is Negative. Fact Sheet for Patients: SugarRoll.be Fact Sheet for Healthcare Providers: https://www.woods-mathews.com/ This test is not yet approved or cleared by the [place] FDA and  has been authorized for detection and/or diagnosis of SARS-CoV-2 by FDA under an Emergency Use Authorization (EUA). This EUA will remain  in effect (meaning this test can be used) for the duration of the COVID-19 declaration under Section 56 4(b)(1) of the Act, 21 U.S.C. section 360bbb-3(b)(1), unless the authorization is terminated or revoked sooner. Performed at [facility], [place] [address] [place], [place] [address]   MRSA PCR Screening     Status: None   Collection Time: 11/01/19  9:26 PM   Specimen: Nasal Mucosa; Nasopharyngeal  Result Value Ref Range Status   MRSA by PCR NEGATIVE NEGATIVE Final    Comment:        The GeneXpert MRSA Assay (FDA approved for NASAL specimens only), is one component of a comprehensive MRSA colonization surveillance program. It is not intended to diagnose MRSA infection nor to guide or monitor treatment for MRSA infections. Performed at [facility], [place] [address], [place], [place] [address]   SARS CORONAVIRUS 2 (TAT 6-24 HRS) Nasopharyngeal Nasopharyngeal Swab     Status: None   Collection Time: 11/04/19  7:08 PM   Specimen: Nasopharyngeal Swab  Result Value Ref Range Status   SARS Coronavirus 2 NEGATIVE NEGATIVE Final    Comment: (NOTE) SARS-CoV-2 target nucleic acids are NOT DETECTED. The SARS-CoV-2 RNA is generally detectable in upper and lower respiratory specimens during the acute phase of infection. Negative results do not preclude SARS-CoV-2 infection, do not rule out co-infections with other pathogens, and should not be used as the sole  basis for treatment or other patient management decisions. Negative results must be combined with clinical observations, patient history, and epidemiological information. The expected result is Negative. Fact Sheet for Patients: SugarRoll.be Fact Sheet for Healthcare Providers: https://www.woods-mathews.com/ This test is not yet approved or cleared by the [place] FDA and  has been authorized for  detection and/or diagnosis of SARS-CoV-2 by FDA under an Emergency Use Authorization (EUA). This EUA will remain  in effect (meaning this test can be used) for the duration of the COVID-19 declaration under Section 56 4(b)(1) of the Act, 21 U.S.C. section 360bbb-3(b)(1), unless the authorization is terminated or revoked sooner. Performed at [facility], [place] [address] [place], [place] [address]       Radiology Studies: VAS [place] LOWER EXTREMITY VENOUS (DVT)  Result Date: 11/06/2019  Lower Venous DVTStudy Indications: Pulmonary embolism.  Comparison Study: 04/03/15 left gastroc clot Performing Technologist: June Leap RDMS, RVT  Examination Guidelines: A complete evaluation includes B-mode imaging, spectral Doppler, color Doppler, and power Doppler as needed of all accessible portions of each vessel. Bilateral testing is considered an integral part of a complete examination. Limited examinations for reoccurring indications may be performed as noted. The reflux portion of the exam is performed with the patient in reverse Trendelenburg.  +---------+---------------+---------+-----------+----------+--------------+ RIGHT    CompressibilityPhasicitySpontaneityPropertiesThrombus Aging +---------+---------------+---------+-----------+----------+--------------+ CFV      Full           Yes      Yes                                 +---------+---------------+---------+-----------+----------+--------------+ SFJ      Full                                                         +---------+---------------+---------+-----------+----------+--------------+ FV Prox  Full                                                        +---------+---------------+---------+-----------+----------+--------------+ FV Mid   Full                                                        +---------+---------------+---------+-----------+----------+--------------+ FV DistalFull                                                        +---------+---------------+---------+-----------+----------+--------------+ PFV      Full                                                        +---------+---------------+---------+-----------+----------+--------------+ POP                     Yes      Yes                                 +---------+---------------+---------+-----------+----------+--------------+ PTV  Full                                                        +---------+---------------+---------+-----------+----------+--------------+ PERO     Full                                                        +---------+---------------+---------+-----------+----------+--------------+   +---------+---------------+---------+-----------+----------+--------------+ LEFT     CompressibilityPhasicitySpontaneityPropertiesThrombus Aging +---------+---------------+---------+-----------+----------+--------------+ CFV      Full           Yes      Yes                                 +---------+---------------+---------+-----------+----------+--------------+ SFJ      Full                                                        +---------+---------------+---------+-----------+----------+--------------+ FV Prox  Full                                                        +---------+---------------+---------+-----------+----------+--------------+ FV Mid   Full                                                         +---------+---------------+---------+-----------+----------+--------------+ FV DistalFull                                                        +---------+---------------+---------+-----------+----------+--------------+ PFV      Full                                                        +---------+---------------+---------+-----------+----------+--------------+ POP                     Yes      Yes                                 +---------+---------------+---------+-----------+----------+--------------+ PTV      Full                                                        +---------+---------------+---------+-----------+----------+--------------+  PERO     Full                                                        +---------+---------------+---------+-----------+----------+--------------+     Summary: RIGHT: - There is no evidence of deep vein thrombosis in the lower extremity.  - No cystic structure found in the popliteal fossa.  LEFT: - There is no evidence of deep vein thrombosis in the lower extremity.  - No cystic structure found in the popliteal fossa.  *See table(s) above for measurements and observations. Electronically signed by Harold Barban MD on 11/06/2019 at 5:35:05 PM.    Final       Scheduled Meds: . apixaban  10 mg Oral BID   Followed by  . [START ON 11/13/2019] apixaban  5 mg Oral BID  . Chlorhexidine Gluconate Cloth  6 each Topical Daily  . feeding supplement (PRO-STAT SUGAR FREE 64)  30 mL Oral BID  . losartan  12.5 mg Oral Daily  . mouth rinse  15 mL Mouth Rinse BID  . multivitamin with minerals  1 tablet Oral Daily  . polyethylene glycol  17 g Oral BID  . rosuvastatin  20 mg Oral q1800  . senna-docusate  1 tablet Oral BID   Continuous Infusions: . sodium chloride 250 mL (11/01/19 2105)     LOS: 6 days      Alma Friendly, MD [facility] 11/07/2019, 4:40 PM   Available via Epic secure chat 7am-7pm After these hours,  please refer to coverage provider listed on [email or web address]

## 2019-11-07 NOTE — Plan of Care (Signed)
1500: PT informed RN that chair alarm batteries were out. Instructed Network engineer to order more. Pt's friend in the room to help keep an eye on the pt and pt verbalized understanding to remain in the chair. Will replace batteries when the unit is re-supplied with some. Will continue to monitor.   Problem: Education: Goal: Knowledge of General Education information will improve Description: Including pain rating scale, medication(s)/side effects and non-pharmacologic comfort measures Outcome: Progressing   Problem: Health Behavior/Discharge Planning: Goal: Ability to manage health-related needs will improve Outcome: Progressing   Problem: Clinical Measurements: Goal: Will remain free from infection Outcome: Progressing Goal: Respiratory complications will improve Outcome: Progressing Goal: Cardiovascular complication will be avoided Outcome: Progressing   Problem: Activity: Goal: Risk for activity intolerance will decrease Outcome: Progressing   Problem: Nutrition: Goal: Adequate nutrition will be maintained Outcome: Progressing   Problem: Elimination: Goal: Will not experience complications related to bowel motility Outcome: Progressing   Problem: Pain Managment: Goal: General experience of comfort will improve Outcome: Progressing   Problem: Safety: Goal: Ability to remain free from injury will improve Outcome: Progressing   Problem: Skin Integrity: Goal: Risk for impaired skin integrity will decrease Outcome: Progressing

## 2019-11-07 NOTE — TOC Transition Note (Signed)
Transition of Care [facility]) - CM/SW Discharge Note   Patient Details  Name: [name] MRN: [id number] Date of Birth: Jun 28, 1932  [facility] CM/SW Contact:  Curlene Labrum, RN Phone Number: 11/07/2019, 2:34 PM   Clinical Narrative:    Case management spoke with Dr. Horris Latino, concerning pending discharge and patient to be discharged tomorrow to [facility] for short term rehab.  Foley to be discontinued today and voiding trial and new COVID to be placed in the order.  [place] and insurance authorization to be continued for 1 more day.  [facility] notified and will notify the patient as well.  Will continue to follow and transfer the patient out tomorrow.     Barriers to Discharge: Ship broker, SNF Pending bed offer   Patient Goals and CMS Choice Patient states their goals for this hospitalization and ongoing recovery are:: "I want to be able to be independent again. I'm at home alone all day and I need to be able to take care of myself." CMS Medicare.gov Compare Post Acute Care list provided to:: Patient Choice offered to / list presented to : Patient  Discharge Placement                       Discharge Plan and Services In-house Referral: Clinical Social Work Discharge Planning Services: NA Post Acute Care Choice: [place]          DME Arranged: N/A DME Agency: NA                  Social Determinants of Health ([place]) Interventions     Readmission Risk Interventions Readmission Risk Prevention Plan 11/04/2019  Transportation Screening Complete  PCP or Specialist Appt within 5-7 Days Complete  Home Care Screening Complete  Medication Review (RN CM) Complete  Some recent data might be hidden

## 2019-11-07 NOTE — Progress Notes (Signed)
Physical Therapy Treatment Patient Details Name: [name] MRN: [id number] DOB: [birth year] Today's Date: 11/07/2019    History of Present Illness 86 y.o. F with PMH of OA<  L breast Ca, HTN, DVT, Depression, squamous cell carcinoma and melanoma who ambulates with a cane and walker at baseline and sustained a mechanical fall at home. Pt found to have L distal femur fx s/p ORIF 4/9. Pt with hypotension post-op requiring pressors and admission to ICU.    PT Comments    Pt continues to present with significant anxiety about mobility this session, but pt participated in repeated transfer training with use of steady, max +2 physical assist, and max verbal encouragement. Pt with difficulty maintaining TDWB LLE due to RLE weakness, requires max multimodal cuing for safety with WB status. Pt up in chair with lift pad placed, RN notified that she will need to be lifted back to bed. PT to continue to follow acutely.    Follow Up Recommendations  SNF;Supervision/Assistance - 24 hour     Equipment Recommendations  Wheelchair (measurements PT);Wheelchair cushion (measurements PT);Hospital bed;Other (comment)    Recommendations for Other Services       Precautions / Restrictions Precautions Precautions: Fall Restrictions Weight Bearing Restrictions: Yes LLE Weight Bearing: Touchdown weight bearing    Mobility  Bed Mobility Overal bed mobility: Needs Assistance Bed Mobility: Supine to Sit     Supine to sit: +2 for physical assistance;HOB elevated;Mod assist     General bed mobility comments: Mod assist +2 for trunk elevation, walking LEs to EOB L>R, scooting to EOB with lateral leaning and use of bed pads. Very increased time.  Transfers Overall transfer level: Needs assistance Equipment used: Ambulation equipment used Transfers: Sit to/from Stand Sit to Stand: Max assist;+2 physical assistance;+2 safety/equipment;From elevated surface         General transfer comment:  Max assist +2 for power up, hip extension, maintaining TDWB via tactile and verbal cuing, slow eccentric lower into recliner with stand to sit, and steadying. PT with use of bari stedy, pt requiring verbal cuing for hand placement, pull to stand, and maintaining stand on RLE while placing seat flaps. Sit to stand x3, from EOB x2 (first attempt unable to fully clear hips to standing) and from stedy in preparation to sit in recliner.  Ambulation/Gait             General Gait Details: unable   Stairs             Wheelchair Mobility    Modified Rankin (Stroke Patients Only)       Balance Overall balance assessment: Needs assistance Sitting-balance support: No upper extremity supported;Feet supported Sitting balance-Leahy Scale: Fair Sitting balance - Comments: able to sit EOB without PT support once feet on ground and with bilateral UE support   Standing balance support: Bilateral upper extremity supported Standing balance-Leahy Scale: Zero Standing balance comment: max +2 to stand                            Cognition Arousal/Alertness: Awake/alert Behavior During Therapy: Anxious Overall Cognitive Status: No family/caregiver present to determine baseline cognitive functioning                                 General Comments: Pt recalls TDWB precaution to PT, very anxious and self-limiting stating "I am a coward" when transferring  Exercises      General Comments General comments (skin integrity, edema, etc.): 3LO2 via [place], SpO2 89% and greater with good pleth. Periods of tachypnea, improved with PT cuing      Pertinent Vitals/Pain Pain Assessment: Faces Faces Pain Scale: Hurts even more Pain Location: LLE Pain Descriptors / Indicators: Grimacing;Other (Comment);Discomfort(hpyerventilating) Pain Intervention(s): Limited activity within patient's tolerance;Monitored during session;Repositioned;Premedicated before session    Home  Living                      Prior Function            PT Goals (current goals can now be found in the care plan section) Acute Rehab PT Goals Patient Stated Goal: To improve mobility and reduce falls risk Potential to Achieve Goals: Fair Additional Goals Additional Goal #1: Pt will mobilize in manual wheelchair for 50' with use of BUE and with minA. Progress towards PT goals: Progressing toward goals    Frequency    Min 3X/week      PT Plan Current plan remains appropriate    Co-evaluation              AM-PAC PT "6 Clicks" Mobility   Outcome Measure  Help needed turning from your back to your side while in a flat bed without using bedrails?: Total Help needed moving from lying on your back to sitting on the side of a flat bed without using bedrails?: Total Help needed moving to and from a bed to a chair (including a wheelchair)?: Total Help needed standing up from a chair using your arms (e.g., wheelchair or bedside chair)?: Total Help needed to walk in hospital room?: Total Help needed climbing 3-5 steps with a railing? : Total 6 Click Score: 6    End of Session Equipment Utilized During Treatment: Gait belt Activity Tolerance: Patient limited by pain;Other (comment)(limited due to anxiety and fear of falling.) Patient left: with call bell/phone within reach;with family/visitor present;in chair(chair alarm out of batteries, no batteries on unit, RN notified and RN states she will replace batteries) Nurse Communication: Mobility status PT Visit Diagnosis: Muscle weakness (generalized) (M62.81);History of falling (Z91.81);Unsteadiness on feet (R26.81);Pain Pain - Right/Left: Left Pain - part of body: Leg     Time: 1420-1455 PT Time Calculation (min) (ACUTE ONLY): 35 min  Charges:  $Therapeutic Activity: 23-37 mins                     [name] E, PT Acute Rehabilitation Services Pager [phone]  Office [phone]    [name] D Elonda Husky 11/07/2019, 4:38  PM

## 2019-11-08 ENCOUNTER — Telehealth: Payer: Self-pay | Admitting: Cardiovascular Disease

## 2019-11-08 LAB — CBC WITH DIFFERENTIAL/PLATELET
Abs Immature Granulocytes: 0.06 10*3/uL (ref 0.00–0.07)
Basophils Absolute: 0 10*3/uL (ref 0.0–0.1)
Basophils Relative: 0 %
Eosinophils Absolute: 0.1 10*3/uL (ref 0.0–0.5)
Eosinophils Relative: 1 %
HCT: 31.7 % — ABNORMAL LOW (ref 36.0–46.0)
Hemoglobin: 10.5 g/dL — ABNORMAL LOW (ref 12.0–15.0)
Immature Granulocytes: 1 %
Lymphocytes Relative: 9 %
Lymphs Abs: 1.2 10*3/uL (ref 0.7–4.0)
MCH: 31.6 pg (ref 26.0–34.0)
MCHC: 33.1 g/dL (ref 30.0–36.0)
MCV: 95.5 fL (ref 80.0–100.0)
Monocytes Absolute: 1.6 10*3/uL — ABNORMAL HIGH (ref 0.1–1.0)
Monocytes Relative: 12 %
Neutro Abs: 10.3 10*3/uL — ABNORMAL HIGH (ref 1.7–7.7)
Neutrophils Relative %: 77 %
Platelets: 236 10*3/uL (ref 150–400)
RBC: 3.32 MIL/uL — ABNORMAL LOW (ref 3.87–5.11)
RDW: 13.6 % (ref 11.5–15.5)
WBC: 13.2 10*3/uL — ABNORMAL HIGH (ref 4.0–10.5)
nRBC: 0 % (ref 0.0–0.2)

## 2019-11-08 LAB — SARS CORONAVIRUS 2 (TAT 6-24 HRS): SARS Coronavirus 2: NEGATIVE

## 2019-11-08 MED ORDER — POLYETHYLENE GLYCOL 3350 17 G PO PACK: 17.0000 g | PACK | Freq: Every day | ORAL | 0 refills | Status: DC | PRN

## 2019-11-08 MED ORDER — APIXABAN 5 MG PO TABS: 10.0000 mg | ORAL_TABLET | Freq: Two times a day (BID) | ORAL | Status: DC

## 2019-11-08 MED ORDER — APIXABAN 5 MG PO TABS: 5.0000 mg | ORAL_TABLET | Freq: Two times a day (BID) | ORAL | Status: DC

## 2019-11-08 MED ORDER — SENNOSIDES-DOCUSATE SODIUM 8.6-50 MG PO TABS: 1.0000 | ORAL_TABLET | Freq: Every day | ORAL | Status: DC

## 2019-11-08 MED ORDER — PRO-STAT SUGAR FREE PO LIQD: 30.0000 mL | Freq: Two times a day (BID) | ORAL | 0 refills | Status: DC

## 2019-11-08 MED ORDER — ROSUVASTATIN CALCIUM 20 MG PO TABS: 20.0000 mg | ORAL_TABLET | Freq: Every day | ORAL | Status: DC

## 2019-11-08 MED ORDER — MAGNESIUM CITRATE PO SOLN
1.0000 | Freq: Once | ORAL | Status: AC
  Administered 2019-11-08: 1 via ORAL
  Filled 2019-11-08: qty 296

## 2019-11-08 MED ORDER — HYDROCODONE-ACETAMINOPHEN 5-325 MG PO TABS: 1.0000 | ORAL_TABLET | Freq: Four times a day (QID) | ORAL | 0 refills | Status: AC | PRN

## 2019-11-08 MED ORDER — LOSARTAN POTASSIUM 25 MG PO TABS: 12.5000 mg | ORAL_TABLET | Freq: Every day | ORAL | Status: DC

## 2019-11-08 MED ORDER — FLEET ENEMA 7-19 GM/118ML RE ENEM: 1.0000 | ENEMA | Freq: Once | RECTAL | Status: DC

## 2019-11-08 NOTE — Telephone Encounter (Signed)
I spoke to the patient's husband and he would like a call from Dr Sallyanne Kuster and given an update on the patient's status.  He is in his office until 6 pm today. [phone] ext [phone]

## 2019-11-08 NOTE — Progress Notes (Signed)
Pt still waiting to be transported to [facility]. [facility] contacted. They have [name] as fourth on their list and should be picking her up in an hour. Notified Janett Billow, the nurse at [facility]. She said it was no problem if the patient arrived late and that they were expecting her.

## 2019-11-08 NOTE — TOC Transition Note (Signed)
Transition of Care [facility]) - CM/SW Discharge Note   Patient Details  Name: [name] MRN: [id number] Date of Birth: June 29, 1932  Transition of [facility]) CM/SW Contact:  Curlene Labrum, RN Phone Number: 11/08/2019, 3:58 PM   Clinical Narrative:       Barriers to Discharge: Insurance Authorization, SNF Pending bed offer   Patient Goals and CMS Choice Patient states their goals for this hospitalization and ongoing recovery are:: "I want to be able to be independent again. I'm at home alone all day and I need to be able to take care of myself." CMS Medicare.gov Compare Post Acute Care list provided to:: Patient Choice offered to / list presented to : Patient  Discharge Placement    Spoke to the patient as well as the husband to notify that she would be transferred to [facility] - Nurse to call [phone].  The husband mentioned on the phone that he arranged for his wife to be placed in a private room, left side of the building with a sofa.  Harriet Pho, [employer], notified. [facility] called for transport.                   Discharge Plan and Services In-house Referral: Clinical Social Work Discharge Planning Services: NA Post Acute Care Choice: [facility]          DME Arranged: N/A DME Agency: NA                  Social Determinants of Health (SDOH) Interventions     Readmission Risk Interventions Readmission Risk Prevention Plan 11/04/2019  Transportation Screening Complete  PCP or Specialist Appt within 5-7 Days Complete  Home Care Screening Complete  Medication Review (RN CM) Complete  Some recent data might be hidden

## 2019-11-08 NOTE — Telephone Encounter (Signed)
I spoke to [place].

## 2019-11-08 NOTE — Progress Notes (Signed)
Pt reports needing to urinate- haas tried to use bedpan but states it burns to-bladder scan showed >700-in/outy cath performed-noted urine smelled "foul"-and [facility] has arrived to pick her up for transport- Messaged on call phys for [facility]- to notify of situation- on call phys gave ok to send her on they will need to collect specime there

## 2019-11-08 NOTE — Discharge Summary (Addendum)
Discharge Summary  [name] [id number] DOB: [birth year]  PCP: Burnard Bunting, MD  Admit date: 10/31/2019 Discharge date: 11/08/2019  Time spent: 40 mins  Recommendations for Outpatient Follow-up:  1. Follow-up with PCP post discharge from SNF 2. Follow-up with orthopedics as scheduled  Discharge Diagnoses:  Active Hospital Problems   Diagnosis Date Noted  . Closed fracture of distal end of left femur, initial encounter ([place]) 11/01/2019  . Non-ST elevation (NSTEMI) myocardial infarction ([place])   . Other fracture of left femur, initial encounter for closed fracture ([place]) 11/01/2019  . Hip fracture ([place]) 11/01/2019  . Postprocedural hypotension   . Prolonged QT interval 05/02/2018  . Diastolic dysfunction without heart failure 03/24/2016  . Essential hypertension, benign 11/20/2012  . Pure hypercholesterolemia 11/20/2012  . Hypokalemia 11/01/2012    Resolved Hospital Problems  No resolved problems to display.    Discharge Condition: Stable  Diet recommendation: Heart healthy  Vitals:   11/08/19 1330 11/08/19 1409  BP: (!) 121/57 (!) 117/55  Pulse: 73 75  Resp: 16 16  Temp: 97.6 F (36.4 C) 98.3 F (36.8 C)  SpO2: 97% 97%    History of present illness:  [name] is an 86 yo female with past medical history significant for HTN, NSVT, PVC, diastolic heart failure, history of left breast cancer, depression, osteoporosis, osteoarthritis who presented to the hospital with left leg pain. She had been grocery shopping, tripped on a rug and fell to the ground.  In the emergency department, she was found to have distal left femur fracture.  She was [facility] to [facility] for surgical intervention.  Postoperatively, she developed hypotension and was transferred to the ICU for pressors. BP improved and patient transferred to telemetry and under [facility] service on 4/11. Cardiology was consulted due to elevated troponin with new LV  dysfunction. She underwent stress test on 4/12.    Today, patient denies any new complaints, reports some intermittent pain around surgical site.  Patient was able to have a large BM today as well as urine incontinence with no further urinary retention.  Patient denies any chest pain, worsening shortness of breath, abdominal pain, nausea/vomiting, fever/chills.  Patient stable to transfer to SNF for further rehab needs.  Attempted to call daughter and spouse on 11/08/2019, no answer.   Hospital Course:  Principal Problem:   Closed fracture of distal end of left femur, initial encounter ([place]) Active Problems:   Hypokalemia   Pure hypercholesterolemia   Essential hypertension, benign   Diastolic dysfunction without heart failure   Prolonged QT interval   Other fracture of left femur, initial encounter for closed fracture [facility])   Hip fracture ([facility])   Postprocedural hypotension   Non-ST elevation (NSTEMI) myocardial infarction [facility])   Distal left femur fracture s/p ORIF by Dr. Marcelino Scot on 11/01/2019 Further management per Ortho PT recommends SNF placement  NSTEMI -Peak troponin 3672 -Echocardiogram revealed EF 40 to 45% with global hypokinesis, severe left ventricular hypertrophy, grade 2 diastolic dysfunction -Cardiology consulted, stress test found to have medium sized, moderate severity of reversibility within the apical to mid segments of the inferior septal wall suspicious for ischemia -Cardiology discussed extensively with patient, opted for medical management, switched PTA simvastatin to rosuvastatin, was initially unable to tolerate ARB due to soft BP, but BP remained stable on low dose losartan. No BB for now, if BP continues to improve, may add on metoprolol. Given age/frailty, would not start aspirin but once anticoagulation is complete for  PE will resume 81 mg daily of aspirin.  Outpatient cardiology follow-up  Acute hypoxemic respiratory failure 2/2 acute PE -Elevated d-dimer  6.09  -Wells pre-test probability of PE 4.5, indicating moderate probability  -CTA chest showed multiple small bilateral segmental PE with evidence of RV strain -Switched to [employer] -Currently on room air -Lower extremity Doppler bilateral negative for DVT  Acute urinary retention Improved- Able to void, bladder scan with about 200cc urine -Required subsequent in and out cath starting 4/12, Foley placed on 11/06/2019, removed on 11/07/19  Postop hypotension Improved -Thought to be sedation related, now off pressor   Essential hypertension Discontinued home maxide, continue recently started low dose losartan, if BP is elevated please add on metoprolol  Chronic diastolic heart failure -Appears euvolemic  HLD -Continue crestor        Malnutrition Type:  Nutrition Problem: Increased nutrient needs Etiology: post-op healing   Malnutrition Characteristics:  Signs/Symptoms: estimated needs   Nutrition Interventions:  Interventions: Ensure Enlive (each supplement provides 350kcal and 20 grams of protein), MVI, Juven   Estimated body mass index is 24.59 kg/m as calculated from the following:   Height as of this encounter: 5\' 6"  (1.676 m).   Weight as of this encounter: 69.1 kg.    Procedures:  ORIF  Stress test  Consultations:  Orthopedics  Cardiology  Discharge Exam: BP (!) 117/55 (BP Location: Left Arm)   Pulse 75   Temp 98.3 F (36.8 C) (Oral)   Resp 16   Ht 5\' 6"  (1.676 m)   Wt 69.1 kg   SpO2 97%   BMI 24.59 kg/m   General: NAD Cardiovascular: S1, S2 present Respiratory: CTA B  Discharge Instructions You were cared for by a hospitalist during your hospital stay. If you have any questions about your discharge medications or the care you received while you were in the hospital after you are discharged, you can call the unit and asked to speak with the hospitalist on call if the hospitalist that took care of you is not available. Once you are  discharged, your primary care physician will handle any further medical issues. Please note that NO REFILLS for any discharge medications will be authorized once you are discharged, as it is imperative that you return to your primary care physician (or establish a relationship with a primary care physician if you do not have one) for your aftercare needs so that they can reassess your need for medications and monitor your lab values.  Discharge Instructions    Diet - low sodium heart healthy   Complete by: As directed    Increase activity slowly   Complete by: As directed      Allergies as of 11/08/2019      Reactions   Oysters [shellfish Allergy] Nausea And Vomiting   Codeine Nausea And Vomiting      Medication List    STOP taking these medications   escitalopram 20 MG tablet Commonly known as: LEXAPRO   potassium chloride 10 MEQ tablet Commonly known as: KLOR-CON   simvastatin 20 MG tablet Commonly known as: ZOCOR   triamterene-hydrochlorothiazide 75-50 MG tablet Commonly known as: MAXZIDE     TAKE these medications   acetaminophen 500 MG tablet Commonly known as: TYLENOL Take 1,000 mg by mouth daily as needed (back pain).   apixaban 5 MG Tabs tablet Commonly known as: ELIQUIS Take 2 tablets (10 mg total) by mouth 2 (two) times daily for 4 days.   apixaban 5 MG Tabs tablet  Commonly known as: ELIQUIS Take 1 tablet (5 mg total) by mouth 2 (two) times daily. Start taking on: November 13, 2019   Calcium-D 600-400 MG-UNIT Tabs Take 1 tablet by mouth daily.   feeding supplement (PRO-STAT SUGAR FREE 64) Liqd Take 30 mLs by mouth 2 (two) times daily.   HYDROcodone-acetaminophen 5-325 MG tablet Commonly known as: NORCO/VICODIN Take 1-2 tablets by mouth every 6 (six) hours as needed for up to 7 days for moderate pain.   losartan 25 MG tablet Commonly known as: COZAAR Take 0.5 tablets (12.5 mg total) by mouth daily. Start taking on: November 09, 2019   Multivitamin Adult  Tabs Take 1 tablet by mouth daily.   polyethylene glycol 17 g packet Commonly known as: MIRALAX / GLYCOLAX Take 17 g by mouth daily as needed.   rosuvastatin 20 MG tablet Commonly known as: CRESTOR Take 1 tablet (20 mg total) by mouth daily at 6 PM.   senna-docusate 8.6-50 MG tablet Commonly known as: Senokot-S Take 1 tablet by mouth at bedtime.      Allergies  Allergen Reactions  . Oysters [Shellfish Allergy] Nausea And Vomiting  . Codeine Nausea And Vomiting   Follow-up Information    Burnard Bunting, MD. Schedule an appointment as soon as possible for a visit in 1 week(s).   Specialty: Internal Medicine Contact information: [place] [address] [phone]        Sanda Klein, MD .   Specialty: Cardiology Contact information: [address] [place] [address] [phone]        Altamese [place], MD Follow up.   Specialty: Orthopedic Surgery Why: Call if any questions concerning follow up Contact information: [place] [address] [phone]        Buford Dresser, MD Follow up.   Specialty: Cardiology Why: Cardiology will call you for an appt Contact information: [address] [place] [address] [phone]            The results of significant diagnostics from this hospitalization (including imaging, microbiology, ancillary and laboratory) are listed below for reference.    Significant Diagnostic Studies: DG Chest 2 View  Result Date: 10/31/2019 CLINICAL DATA:  86 year old female with fall. Preop chest radiograph. EXAM: CHEST - 2 VIEW COMPARISON:  Chest radiograph dated 05/21/2019. FINDINGS: Diffuse bilateral chronic interstitial coarsening and bronchitic changes. No focal consolidation, pleural effusion, or pneumothorax. Stable cardiac silhouette. Atherosclerotic calcification of the aorta. Old bilateral rib fractures. No acute osseous pathology. Osteopenia with  degenerative changes of the spine and old compression fractures of the lower thoracic spine with vertebroplasty. Left axillary surgical clips. IMPRESSION: No acute cardiopulmonary process. Electronically Signed   By: Anner Crete M.D.   On: 10/31/2019 23:49   CT Head Wo Contrast  Result Date: 10/31/2019 CLINICAL DATA:  Fall EXAM: CT HEAD WITHOUT CONTRAST TECHNIQUE: Contiguous axial images were obtained from the base of the skull through the vertex without intravenous contrast. COMPARISON:  07/17/2019 FINDINGS: Brain: There is atrophy and chronic small vessel disease changes. No acute intracranial abnormality. Specifically, no hemorrhage, hydrocephalus, mass lesion, acute infarction, or significant intracranial injury. Vascular: No hyperdense vessel or unexpected calcification. Skull: No acute calvarial abnormality. Sinuses/Orbits: Visualized paranasal sinuses and mastoids clear. Orbital soft tissues unremarkable. Other: None IMPRESSION: Atrophy, chronic microvascular disease. No acute intracranial abnormality. Electronically Signed   By: Rolm Baptise M.D.   On: 10/31/2019 23:23   CT ANGIO CHEST PE W OR WO CONTRAST  Addendum  Date: 11/05/2019   ADDENDUM REPORT: 11/05/2019 15:40 ADDENDUM: Note the above findings were discussed with Dr. Dessa Phi at 12:58 p.m. Electronically Signed   By: Marin Olp M.D.   On: 11/05/2019 15:40   Result Date: 11/05/2019 CLINICAL DATA:  Shortness of breath.  Suspect PE.  Elevated D-dimer. EXAM: CT ANGIOGRAPHY CHEST WITH CONTRAST TECHNIQUE: Multidetector CT imaging of the chest was performed using the standard protocol during bolus administration of intravenous contrast. Multiplanar CT image reconstructions and MIPs were obtained to evaluate the vascular anatomy. CONTRAST:  126mL OMNIPAQUE IOHEXOL 350 MG/ML SOLN COMPARISON:  10/19/2014 FINDINGS: Cardiovascular: There is mild-to-moderate cardiomegaly present. Mild calcified plaque over the left anterior descending  coronary artery. Thoracic aorta demonstrates no evidence of aneurysm or dissection. Minimal calcified plaque over the descending thoracic aorta. Prominence of the main pulmonary artery segment as a pulmonary arterial system is well opacified. There multiple small segmental and subsegmental emboli bilaterally. Elevated RV/LV ratio compatible with right heart strain. Remaining vascular structures are unremarkable. Mediastinum/Nodes: No evidence of mediastinal or hilar adenopathy. Remaining mediastinal structures are unremarkable. Lungs/Pleura: Lungs are well inflated and demonstrate moderate right effusion and small left effusion with associated bibasilar atelectasis. Airways are normal. Upper Abdomen: Calcified plaque over the abdominal aorta. No acute findings. Musculoskeletal: Multiple spinal compression fractures without significant change with the exception of a moderate compression fracture over the lower thoracic spine age indeterminate but new since the previous exam. Review of the MIP images confirms the above findings. IMPRESSION: 1. Multiple small bilateral segmental and subsegmental pulmonary emboli. Evidence of right heart strain. Associated moderate right effusion and small left effusion with bibasilar atelectasis. 2.  Cardiomegaly. 3. Multiple thoracic and lumbar spine compression fractures without significant change. Moderate compression fracture over the lower thoracic spine new since the previous exam but age indeterminate. 4. Aortic Atherosclerosis (ICD10-I70.0). Atherosclerotic coronary artery disease. Electronically Signed: By: Marin Olp M.D. On: 11/05/2019 12:52   CT Cervical Spine Wo Contrast  Result Date: 10/31/2019 CLINICAL DATA:  Fall EXAM: CT CERVICAL SPINE WITHOUT CONTRAST TECHNIQUE: Multidetector CT imaging of the cervical spine was performed without intravenous contrast. Multiplanar CT image reconstructions were also generated. COMPARISON:  09/15/2015 FINDINGS: Alignment: No  subluxation Skull base and vertebrae: Old unfused odontoid fracture again noted, similar to prior study. Progressive degenerative change at C1-2. Stable mild compression deformity through the superior endplate of C7. No acute fracture. Soft tissues and spinal canal: Central disc herniation at C3-4 is stable. No prevertebral fluid or swelling. No visible canal hematoma. Disc levels: Disc herniation at C3-4 as above. Diffuse degenerative disc disease and facet disease. Upper chest: Biapical scarring.  No acute findings. Other: None IMPRESSION: Chronic unfused odontoid fracture with associated degenerative changes at C1-2. Degenerative changes are progressive since 2016. Stable chronic mild compression deformity of C7. Stable central disc herniation at C3-4. No acute bony abnormality. Degenerative disc and facet disease diffusely. Electronically Signed   By: Rolm Baptise M.D.   On: 10/31/2019 23:31   NM Myocar Multi W/Spect Tamela Oddi Motion / EF  Result Date: 11/05/2019 CLINICAL DATA:  Ischemic cardiomyopathy.  Evaluate viability. EXAM: MYOCARDIAL IMAGING WITH SPECT (REST AND PHARMACOLOGIC-STRESS) GATED LEFT VENTRICULAR WALL MOTION STUDY LEFT VENTRICULAR EJECTION FRACTION TECHNIQUE: Standard myocardial SPECT imaging was performed after resting intravenous injection of 10 mCi Tc-6m tetrofosmin. Subsequently, intravenous infusion of Lexiscan was performed under the supervision of the Cardiology staff. At peak effect of the drug, 30 mCi Tc-15m tetrofosmin was injected intravenously and standard myocardial SPECT imaging was  performed. Quantitative gated imaging was also performed to evaluate left ventricular wall motion, and estimate left ventricular ejection fraction. COMPARISON:  None. FINDINGS: Perfusion: No rest defect. With stress, a medium size, moderate severity area of reversibility is identified within the apical to mid segment of the inferoseptal wall. Wall Motion: Normal left ventricular wall motion. No left  ventricular dilation. Left Ventricular Ejection Fraction: 69 % End diastolic volume 69 ml End systolic volume 22 ml IMPRESSION: 1. Medium size, moderate severity area of reversibility within the apical to mid segment of the inferoseptal wall. This is suspicious for inducible ischemia. 2. Normal left ventricular wall motion. 3. Left ventricular ejection fraction 69% 4. Non invasive risk stratification*: Intermediate *2012 Appropriate Use Criteria for Coronary Revascularization Focused Update: J Am Coll Cardiol. [id number]. http://content.airportbarriers.com.aspx?articleid=[email or web address] Electronically Signed   By: Abigail Miyamoto M.D.   On: 11/05/2019 15:51   DG Knee Complete 4 Views Left  Result Date: 10/31/2019 CLINICAL DATA:  86 year old female with trauma to the left lower extremity. EXAM: LEFT KNEE - COMPLETE 4+ VIEW; LEFT FEMUR 2 VIEWS COMPARISON:  None. FINDINGS: There is a displaced and angulated oblique fracture of the distal left femur involving the distal femoral metadiaphysis. There is approximately 3 cm medial displacement of the distal fracture fragment and posterior angulation. There is advanced osteopenia. Severe arthritic changes of the left knee with tricompartmental narrowing most pronounced involving the lateral compartment. There is no dislocation. There is a small suprapatellar effusion. The soft tissues are unremarkable. Vascular calcifications noted. IMPRESSION: 1. Displaced and angulated oblique fracture of the distal left femur. No dislocation. 2. Osteopenia and severe osteoarthritic changes of the left knee. Electronically Signed   By: Anner Crete M.D.   On: 10/31/2019 23:28   DG Knee Left Port  Result Date: 11/01/2019 CLINICAL DATA:  Postop internal fixation EXAM: PORTABLE LEFT KNEE - 1-2 VIEW COMPARISON:  10/31/2019 FINDINGS: Plate and screw fixation across the oblique distal left femoral fracture. Near anatomic alignment. No complicating feature. IMPRESSION: Internal  fixation.  No complicating feature. Electronically Signed   By: Rolm Baptise M.D.   On: 11/01/2019 19:09   DG C-Arm 1-60 Min  Result Date: 11/01/2019 CLINICAL DATA:  ORIF distal femur fracture EXAM: LEFT FEMUR 2 VIEWS; DG C-ARM 1-60 MIN COMPARISON:  None. FINDINGS: Plate and screw fixation across distal left femoral fracture. No hardware complicating feature. Near anatomic alignment. IMPRESSION: Internal fixation across the distal left femoral fracture. No complicating feature. Electronically Signed   By: Rolm Baptise M.D.   On: 11/01/2019 17:58   ECHOCARDIOGRAM COMPLETE  Result Date: 11/02/2019    ECHOCARDIOGRAM REPORT   Patient Name:   ARNA BERRIOS Sledd Date of Exam: 11/02/2019 Medical Rec #:  [id number]            Height:       66.0 in Accession #:    [id number]           Weight:       149.7 lb Date of Birth:  1931/08/11           BSA:          1.768 m Patient Age:    11 years             BP:           85/52 mmHg Patient Gender: F                    HR:  81 bpm. Exam Location:  Inpatient Procedure: 2D Echo, 3D Echo, Cardiac Doppler and Color Doppler Indications:    R07.9* Chest pain, unspecified. Elevated troponin  History:        Patient has prior history of Echocardiogram examinations, most                 recent 05/03/2018. Abnormal ECG, Arrythmias:NSVT;                 Signs/Symptoms:Hypotension. History of breast cancer.  Sonographer:    Roseanna Rainbow RDCS Referring Phys: [id number] [place]  1. False tendon in LV apex of no clinical significance. Left ventricular ejection fraction, by estimation, is 40 to 45%. The left ventricle has mildly decreased function. The left ventricle demonstrates global hypokinesis. There is severe left ventricular hypertrophy of the basal-septal segment. Left ventricular diastolic parameters are consistent with Grade II diastolic dysfunction (pseudonormalization). Elevated left ventricular end-diastolic pressure.  2. Right ventricular systolic  function is moderately reduced. The right ventricular size is mildly enlarged. There is moderately elevated pulmonary artery systolic pressure.  3. Right atrial size was severely dilated.  4. The mitral valve is normal in structure. Trivial mitral valve regurgitation. No evidence of mitral stenosis.  5. The aortic valve is tricuspid. Aortic valve regurgitation is mild. Mild to moderate aortic valve sclerosis/calcification is present, without any evidence of aortic stenosis.  6. Aortic dilatation noted. There is mild dilatation of the ascending aorta measuring 41 mm.  7. The inferior vena cava is dilated in size with <50% respiratory variability, suggesting right atrial pressure of 15 mmHg. FINDINGS  Left Ventricle: False tendon in LV apex of no clinical significance. Left ventricular ejection fraction, by estimation, is 40 to 45%. The left ventricle has mildly decreased function. The left ventricle demonstrates global hypokinesis. The left ventricular internal cavity size was normal in size. There is severe left ventricular hypertrophy of the basal-septal segment. Abnormal (paradoxical) septal motion, consistent with left bundle branch block. Left ventricular diastolic parameters are consistent with Grade II diastolic dysfunction (pseudonormalization). Elevated left ventricular end-diastolic pressure. Right Ventricle: The right ventricular size is mildly enlarged. No increase in right ventricular wall thickness. Right ventricular systolic function is moderately reduced. There is moderately elevated pulmonary artery systolic pressure. The tricuspid regurgitant velocity is 2.63 m/s, and with an assumed right atrial pressure of 15 mmHg, the estimated right ventricular systolic pressure is 123456 mmHg. Left Atrium: Left atrial size was normal in size. Right Atrium: Right atrial size was severely dilated. Pericardium: There is no evidence of pericardial effusion. Mitral Valve: The mitral valve is normal in structure.  Normal mobility of the mitral valve leaflets. Moderate mitral annular calcification. Trivial mitral valve regurgitation. No evidence of mitral valve stenosis. MV peak gradient, 8.1 mmHg. The mean mitral valve gradient is 2.0 mmHg. Tricuspid Valve: The tricuspid valve is normal in structure. Tricuspid valve regurgitation is mild . No evidence of tricuspid stenosis. Aortic Valve: The aortic valve is tricuspid. Aortic valve regurgitation is mild. Aortic regurgitation PHT measures 453 msec. Mild to moderate aortic valve sclerosis/calcification is present, without any evidence of aortic stenosis. Pulmonic Valve: The pulmonic valve was normal in structure. Pulmonic valve regurgitation is not visualized. No evidence of pulmonic stenosis. Aorta: The aortic root is normal in size and structure and aortic dilatation noted. There is mild dilatation of the ascending aorta measuring 41 mm. Venous: The inferior vena cava is dilated in size with less than 50% respiratory variability, suggesting right atrial pressure  of 15 mmHg. IAS/Shunts: No atrial level shunt detected by color flow Doppler.  LEFT VENTRICLE PLAX 2D LVIDd:         3.60 cm     Diastology LVIDs:         2.55 cm     LV e' lateral:   4.90 cm/s LV PW:         1.60 cm     LV E/e' lateral: 26.2 LV IVS:        1.80 cm     LV e' medial:    9.68 cm/s LVOT diam:     1.80 cm     LV E/e' medial:  13.3 LV SV:         38 LV SV Index:   22 LVOT Area:     2.54 cm  LV Volumes (MOD) LV vol d, MOD A2C: 70.9 ml LV vol d, MOD A4C: 69.7 ml LV vol s, MOD A2C: 36.9 ml LV vol s, MOD A4C: 47.2 ml LV SV MOD A2C:     34.0 ml LV SV MOD A4C:     69.7 ml LV SV MOD BP:      29.3 ml IVC IVC diam: 2.50 cm LEFT ATRIUM             Index       RIGHT ATRIUM           Index LA diam:        3.60 cm 2.04 cm/m  RA Area:     22.40 cm LA Vol (A2C):   41.1 ml 23.25 ml/m RA Volume:   78.40 ml  44.34 ml/m LA Vol (A4C):   46.4 ml 26.24 ml/m LA Biplane Vol: 47.4 ml 26.81 ml/m  AORTIC VALVE LVOT Vmax:    93.30 cm/s LVOT Vmean:  64.700 cm/s LVOT VTI:    0.151 m AI PHT:      453 msec  AORTA Ao Root diam: 3.60 cm Ao Asc diam:  4.10 cm MITRAL VALVE                TRICUSPID VALVE MV Area (PHT): 6.43 cm     TR Peak grad:   27.7 mmHg MV Peak grad:  8.1 mmHg     TR Vmax:        263.00 cm/s MV Mean grad:  2.0 mmHg MV Vmax:       1.42 m/s     SHUNTS MV Vmean:      49.4 cm/s    Systemic VTI:  0.15 m MV Decel Time: 118 msec     Systemic Diam: 1.80 cm MV E velocity: 128.50 cm/s Fransico Him MD Electronically signed by Fransico Him MD Signature Date/Time: 11/02/2019/12:34:35 PM    Final    DG FEMUR MIN 2 VIEWS LEFT  Result Date: 11/01/2019 CLINICAL DATA:  ORIF distal femur fracture EXAM: LEFT FEMUR 2 VIEWS; DG C-ARM 1-60 MIN COMPARISON:  None. FINDINGS: Plate and screw fixation across distal left femoral fracture. No hardware complicating feature. Near anatomic alignment. IMPRESSION: Internal fixation across the distal left femoral fracture. No complicating feature. Electronically Signed   By: Rolm Baptise M.D.   On: 11/01/2019 17:58   DG FEMUR MIN 2 VIEWS LEFT  Result Date: 10/31/2019 CLINICAL DATA:  86 year old female with trauma to the left lower extremity. EXAM: LEFT KNEE - COMPLETE 4+ VIEW; LEFT FEMUR 2 VIEWS COMPARISON:  None. FINDINGS: There is a displaced and angulated oblique fracture of the distal left femur involving  the distal femoral metadiaphysis. There is approximately 3 cm medial displacement of the distal fracture fragment and posterior angulation. There is advanced osteopenia. Severe arthritic changes of the left knee with tricompartmental narrowing most pronounced involving the lateral compartment. There is no dislocation. There is a small suprapatellar effusion. The soft tissues are unremarkable. Vascular calcifications noted. IMPRESSION: 1. Displaced and angulated oblique fracture of the distal left femur. No dislocation. 2. Osteopenia and severe osteoarthritic changes of the left knee. Electronically  Signed   By: Anner Crete M.D.   On: 10/31/2019 23:28   VAS [place] LOWER EXTREMITY VENOUS (DVT)  Result Date: 11/06/2019  Lower Venous DVTStudy Indications: Pulmonary embolism.  Comparison Study: 04/03/15 left gastroc clot Performing Technologist: June Leap RDMS, RVT  Examination Guidelines: A complete evaluation includes B-mode imaging, spectral Doppler, color Doppler, and power Doppler as needed of all accessible portions of each vessel. Bilateral testing is considered an integral part of a complete examination. Limited examinations for reoccurring indications may be performed as noted. The reflux portion of the exam is performed with the patient in reverse Trendelenburg.  +---------+---------------+---------+-----------+----------+--------------+ RIGHT    CompressibilityPhasicitySpontaneityPropertiesThrombus Aging +---------+---------------+---------+-----------+----------+--------------+ CFV      Full           Yes      Yes                                 +---------+---------------+---------+-----------+----------+--------------+ SFJ      Full                                                        +---------+---------------+---------+-----------+----------+--------------+ FV Prox  Full                                                        +---------+---------------+---------+-----------+----------+--------------+ FV Mid   Full                                                        +---------+---------------+---------+-----------+----------+--------------+ FV DistalFull                                                        +---------+---------------+---------+-----------+----------+--------------+ PFV      Full                                                        +---------+---------------+---------+-----------+----------+--------------+ POP                     Yes      Yes                                  +---------+---------------+---------+-----------+----------+--------------+  PTV      Full                                                        +---------+---------------+---------+-----------+----------+--------------+ PERO     Full                                                        +---------+---------------+---------+-----------+----------+--------------+   +---------+---------------+---------+-----------+----------+--------------+ LEFT     CompressibilityPhasicitySpontaneityPropertiesThrombus Aging +---------+---------------+---------+-----------+----------+--------------+ CFV      Full           Yes      Yes                                 +---------+---------------+---------+-----------+----------+--------------+ SFJ      Full                                                        +---------+---------------+---------+-----------+----------+--------------+ FV Prox  Full                                                        +---------+---------------+---------+-----------+----------+--------------+ FV Mid   Full                                                        +---------+---------------+---------+-----------+----------+--------------+ FV DistalFull                                                        +---------+---------------+---------+-----------+----------+--------------+ PFV      Full                                                        +---------+---------------+---------+-----------+----------+--------------+ POP                     Yes      Yes                                 +---------+---------------+---------+-----------+----------+--------------+ PTV      Full                                                        +---------+---------------+---------+-----------+----------+--------------+  PERO     Full                                                         +---------+---------------+---------+-----------+----------+--------------+     Summary: RIGHT: - There is no evidence of deep vein thrombosis in the lower extremity.  - No cystic structure found in the popliteal fossa.  LEFT: - There is no evidence of deep vein thrombosis in the lower extremity.  - No cystic structure found in the popliteal fossa.  *See table(s) above for measurements and observations. Electronically signed by Harold Barban MD on 11/06/2019 at 5:35:05 PM.    Final     Microbiology: Recent Results (from the past 240 hour(s))  SARS CORONAVIRUS 2 (TAT 6-24 HRS) Nasopharyngeal Nasopharyngeal Swab     Status: None   Collection Time: 10/31/19 11:25 PM   Specimen: Nasopharyngeal Swab  Result Value Ref Range Status   SARS Coronavirus 2 NEGATIVE NEGATIVE Final    Comment: (NOTE) SARS-CoV-2 target nucleic acids are NOT DETECTED. The SARS-CoV-2 RNA is generally detectable in upper and lower respiratory specimens during the acute phase of infection. Negative results do not preclude SARS-CoV-2 infection, do not rule out co-infections with other pathogens, and should not be used as the sole basis for treatment or other patient management decisions. Negative results must be combined with clinical observations, patient history, and epidemiological information. The expected result is Negative. Fact Sheet for Patients: SugarRoll.be Fact Sheet for Healthcare Providers: https://www.woods-mathews.com/ This test is not yet approved or cleared by the [place] FDA and  has been authorized for detection and/or diagnosis of SARS-CoV-2 by FDA under an Emergency Use Authorization (EUA). This EUA will remain  in effect (meaning this test can be used) for the duration of the COVID-19 declaration under Section 56 4(b)(1) of the Act, 21 U.S.C. section 360bbb-3(b)(1), unless the authorization is terminated or revoked sooner. Performed at [facility], [place] [address], [place], [place] [address]   MRSA PCR Screening     Status: None   Collection Time: 11/01/19  9:26 PM   Specimen: Nasal Mucosa; Nasopharyngeal  Result Value Ref Range Status   MRSA by PCR NEGATIVE NEGATIVE Final    Comment:        The GeneXpert MRSA Assay (FDA approved for NASAL specimens only), is one component of a comprehensive MRSA colonization surveillance program. It is not intended to diagnose MRSA infection nor to guide or monitor treatment for MRSA infections. Performed at [facility], [place] [address], [place], [place] [address]   SARS CORONAVIRUS 2 (TAT 6-24 HRS) Nasopharyngeal Nasopharyngeal Swab     Status: None   Collection Time: 11/04/19  7:08 PM   Specimen: Nasopharyngeal Swab  Result Value Ref Range Status   SARS Coronavirus 2 NEGATIVE NEGATIVE Final    Comment: (NOTE) SARS-CoV-2 target nucleic acids are NOT DETECTED. The SARS-CoV-2 RNA is generally detectable in upper and lower respiratory specimens during the acute phase of infection. Negative results do not preclude SARS-CoV-2 infection, do not rule out co-infections with other pathogens, and should not be used as the sole basis for treatment or other patient management decisions. Negative results must be combined with clinical observations, patient history, and epidemiological information. The expected result is Negative. Fact Sheet for Patients: SugarRoll.be Fact Sheet for  Healthcare Providers: https://www.woods-mathews.com/ This test is not yet approved or cleared by the [place] and  has been authorized for detection and/or diagnosis of SARS-CoV-2 by FDA under an Emergency Use Authorization (EUA). This EUA will remain  in effect (meaning this test can be used) for the duration of the COVID-19 declaration under Section 56 4(b)(1) of the Act, 21 U.S.C. section 360bbb-3(b)(1), unless the authorization is terminated or revoked  sooner. Performed at [facility], [place] [address], [place], [place] [address]   SARS CORONAVIRUS 2 (TAT 6-24 HRS) Nasopharyngeal Nasopharyngeal Swab     Status: None   Collection Time: 11/08/19  5:00 AM   Specimen: Nasopharyngeal Swab  Result Value Ref Range Status   SARS Coronavirus 2 NEGATIVE NEGATIVE Final    Comment: (NOTE) SARS-CoV-2 target nucleic acids are NOT DETECTED. The SARS-CoV-2 RNA is generally detectable in upper and lower respiratory specimens during the acute phase of infection. Negative results do not preclude SARS-CoV-2 infection, do not rule out co-infections with other pathogens, and should not be used as the sole basis for treatment or other patient management decisions. Negative results must be combined with clinical observations, patient history, and epidemiological information. The expected result is Negative. Fact Sheet for Patients: SugarRoll.be Fact Sheet for Healthcare Providers: https://www.woods-mathews.com/ This test is not yet approved or cleared by the [place] FDA and  has been authorized for detection and/or diagnosis of SARS-CoV-2 by FDA under an Emergency Use Authorization (EUA). This EUA will remain  in effect (meaning this test can be used) for the duration of the COVID-19 declaration under Section 56 4(b)(1) of the Act, 21 U.S.C. section 360bbb-3(b)(1), unless the authorization is terminated or revoked sooner. Performed at [facility], [place] [address] [place], [place] [address]      Labs: Basic Metabolic Panel: Recent Labs  Lab 11/02/19 0029 11/03/19 0538 11/04/19 0439 11/06/19 0257 11/07/19 0449  NA 138 136 136 137 139  K 3.8 4.1 3.8 3.3* 3.5  CL 102 98 99 100 102  CO2 24 29 25 26 29   GLUCOSE 162* 124* 112* 102* 102*  BUN 18 26* 29* 22 25*  CREATININE 0.75 0.79 0.64 0.54 0.58  CALCIUM 8.7* 9.0 8.7* 8.6* 8.7*  MG 1.6*  --   --   --   --   PHOS 3.5  --   --   --   --     Liver Function Tests: Recent Labs  Lab 11/02/19 0029  AST 55*  ALT 37  ALKPHOS 58  BILITOT 1.4*  PROT 5.9*  ALBUMIN 3.4*   No results for input(s): LIPASE, AMYLASE in the last 168 hours. No results for input(s): AMMONIA in the last 168 hours. CBC: Recent Labs  Lab 11/02/19 0029 11/02/19 0029 11/03/19 0538 11/04/19 0439 11/06/19 0257 11/07/19 1000 11/08/19 0819  WBC 11.9*   < > 8.4 7.2 7.2 6.6 13.2*  NEUTROABS 9.7*  --   --   --   --  4.6 10.3*  HGB 12.0   < > 10.3* 10.2* 10.4* 11.1* 10.5*  HCT 37.0   < > 32.1* 31.7* 31.8* 33.5* 31.7*  MCV 96.6   < > 96.4 96.1 94.1 94.1 95.5  PLT 244   < > 160 169 173 218 236   < > = values in this interval not displayed.   Cardiac Enzymes: Recent Labs  Lab 11/02/19 0029  CKTOTAL 243*   BNP: BNP (last 3 results) No results for input(s): BNP  in the last 8760 hours.  ProBNP (last 3 results) No results for input(s): PROBNP in the last 8760 hours.  CBG: No results for input(s): GLUCAP in the last 168 hours.     Signed:  Alma Friendly, MD [facility] 11/08/2019, 2:34 PM

## 2019-11-08 NOTE — Progress Notes (Signed)
1500 Pt is ready to be discharged to SNF, report was given to [facility] RN at [employer].  Left hip incision with sutures dry and intact, new Mipilex dressing applied.  [place] waiting for PTAR, pt's daughter in the room.

## 2019-11-08 NOTE — Telephone Encounter (Signed)
Husband of the patient would like Dr. Loletha Grayer to update him on his wife's status. The husband has not been able to speak with her and he does not know what is going on.

## 2019-11-08 NOTE — Progress Notes (Signed)
22:00 Patient urinated a small amount while trying to have a bowel movement on the bed pan. Unsuccessful in attempt to move bowels. Bladder scanned for 188 ml.     06:00 am Bladder scanned patient again this morning.  Scanned for 767 ml. Straight cath patient for 650 urine output.  Day shift nurse made aware.

## 2019-11-08 NOTE — Progress Notes (Signed)
Occupational Therapy Treatment Patient Details Name: [name] MRN: [id number] DOB: November 06, 1931 Today's Date: 11/08/2019    History of present illness 86 y.o. F with PMH of OA<  L breast Ca, HTN, DVT, Depression, squamous cell carcinoma and melanoma who ambulates with a cane and walker at baseline and sustained a mechanical fall at home. Pt found to have L distal femur fx s/p ORIF 4/9. Pt with hypotension post-op requiring pressors and admission to ICU.   OT comments  Pt. Seen for skilled OT treatment session.  Pt. Able to complete bed mobility max a. Seated eob for introduction of a/e for lb adls.  Max a for use of reacher and sock aide.  Pt. With limited participation due to c/o pain and stating that she "just cant".  Cont. With A/E and progress adls next session as pt. Able.    Follow Up Recommendations  SNF;Supervision/Assistance - 24 hour    Equipment Recommendations  3 in 1 bedside commode    Recommendations for Other Services      Precautions / Restrictions Precautions Precautions: Fall Restrictions LLE Weight Bearing: Touchdown weight bearing       Mobility Bed Mobility Overal bed mobility: Needs Assistance Bed Mobility: Supine to Sit     Supine to sit: Max assist Sit to supine: Max assist   General bed mobility comments: max a with inst. for pt. to utilize bed rail for assistance. pt. then switched and was able to prop on elbows semi long sitting and i guided bles oob. pt. would not assist with any scooting. sitting completely slumped over. cues to try and sit up and hold head up. would do so briefly then put head down and moan  Transfers                      Balance                                           ADL either performed or assessed with clinical judgement   ADL Overall ADL's : Needs assistance/impaired                     Lower Body Dressing: Moderate assistance;Maximal assistance;Sitting/lateral  leans;Cueing for sequencing;With adaptive equipment Lower Body Dressing Details (indicate cue type and reason): introduction of A/E. pt. seated eob and guided through use of reacher and sock aide to doff/don sock               General ADL Comments: pt limited by decreased strength pain and decreased activity tolerance. focus of session bed mobility and seated eob for introduction of A/E. pt unable to utilize the equiprment without physical assistance.     Vision       Perception     Praxis      Cognition Arousal/Alertness: Awake/alert Behavior During Therapy: (sad and defeated "i really think this is it, im not going to get better")                                   General Comments: very defeated "i really think this is it, i dont think im going to make it, i think this is it"        Exercises     Shoulder Instructions  General Comments      Pertinent Vitals/ Pain       Pain Assessment: Faces Faces Pain Scale: Hurts whole lot Pain Location: LLE Pain Descriptors / Indicators: Grimacing  Home Living                                          Prior Functioning/Environment              Frequency  Min 2X/week        Progress Toward Goals  OT Goals(current goals can now be found in the care plan section)  Progress towards OT goals: Progressing toward goals     Plan      Co-evaluation                 AM-PAC OT "6 Clicks" Daily Activity     Outcome Measure   Help from another person eating meals?: A Little Help from another person taking care of personal grooming?: A Little Help from another person toileting, which includes using toliet, bedpan, or urinal?: A Lot Help from another person bathing (including washing, rinsing, drying)?: A Lot Help from another person to put on and taking off regular upper body clothing?: A Little Help from another person to put on and taking off regular lower body  clothing?: A Lot 6 Click Score: 15    End of Session    OT Visit Diagnosis: Unsteadiness on feet (R26.81);Other abnormalities of gait and mobility (R26.89);Muscle weakness (generalized) (M62.81);Pain Pain - Right/Left: Left Pain - part of body: Leg   Activity Tolerance Patient limited by pain;Patient limited by lethargy   Patient Left in chair;with call bell/phone within reach;with bed alarm set;with nursing/sitter in room   Nurse Communication          Time: [phone] OT Time Calculation (min): 15 min  Charges: OT General Charges $OT Visit: 1 Visit OT Treatments $Self Care/Home Management : 8-22 mins  Sonia Baller, COTA/L Acute Rehabilitation [phone]   Janice Coffin 11/08/2019, 12:12 PM

## 2019-11-08 NOTE — Plan of Care (Signed)

## 2019-11-09 DIAGNOSIS — I214 Non-ST elevation (NSTEMI) myocardial infarction: Secondary | ICD-10-CM | POA: Diagnosis not present

## 2019-11-09 DIAGNOSIS — R0989 Other specified symptoms and signs involving the circulatory and respiratory systems: Secondary | ICD-10-CM | POA: Diagnosis not present

## 2019-11-09 DIAGNOSIS — S72402D Unspecified fracture of lower end of left femur, subsequent encounter for closed fracture with routine healing: Secondary | ICD-10-CM | POA: Diagnosis not present

## 2019-11-09 DIAGNOSIS — F05 Delirium due to known physiological condition: Secondary | ICD-10-CM | POA: Diagnosis not present

## 2019-11-09 DIAGNOSIS — R82998 Other abnormal findings in urine: Secondary | ICD-10-CM | POA: Diagnosis not present

## 2019-11-09 DIAGNOSIS — I872 Venous insufficiency (chronic) (peripheral): Secondary | ICD-10-CM | POA: Diagnosis not present

## 2019-11-09 DIAGNOSIS — M810 Age-related osteoporosis without current pathological fracture: Secondary | ICD-10-CM | POA: Diagnosis not present

## 2019-11-09 DIAGNOSIS — R001 Bradycardia, unspecified: Secondary | ICD-10-CM | POA: Diagnosis not present

## 2019-11-09 DIAGNOSIS — E876 Hypokalemia: Secondary | ICD-10-CM | POA: Diagnosis not present

## 2019-11-09 DIAGNOSIS — E785 Hyperlipidemia, unspecified: Secondary | ICD-10-CM | POA: Diagnosis not present

## 2019-11-09 DIAGNOSIS — R140 Abdominal distension (gaseous): Secondary | ICD-10-CM | POA: Diagnosis not present

## 2019-11-09 DIAGNOSIS — Z86711 Personal history of pulmonary embolism: Secondary | ICD-10-CM | POA: Diagnosis not present

## 2019-11-09 DIAGNOSIS — S7292XD Unspecified fracture of left femur, subsequent encounter for closed fracture with routine healing: Secondary | ICD-10-CM | POA: Diagnosis not present

## 2019-11-09 DIAGNOSIS — N390 Urinary tract infection, site not specified: Secondary | ICD-10-CM | POA: Diagnosis not present

## 2019-11-09 DIAGNOSIS — F331 Major depressive disorder, recurrent, moderate: Secondary | ICD-10-CM | POA: Diagnosis not present

## 2019-11-09 DIAGNOSIS — M6281 Muscle weakness (generalized): Secondary | ICD-10-CM | POA: Diagnosis not present

## 2019-11-09 DIAGNOSIS — I9581 Postprocedural hypotension: Secondary | ICD-10-CM | POA: Diagnosis not present

## 2019-11-09 DIAGNOSIS — F411 Generalized anxiety disorder: Secondary | ICD-10-CM | POA: Diagnosis not present

## 2019-11-09 DIAGNOSIS — R3914 Feeling of incomplete bladder emptying: Secondary | ICD-10-CM | POA: Diagnosis not present

## 2019-11-09 DIAGNOSIS — I10 Essential (primary) hypertension: Secondary | ICD-10-CM | POA: Diagnosis not present

## 2019-11-09 DIAGNOSIS — I5032 Chronic diastolic (congestive) heart failure: Secondary | ICD-10-CM | POA: Diagnosis not present

## 2019-11-09 DIAGNOSIS — R1084 Generalized abdominal pain: Secondary | ICD-10-CM | POA: Diagnosis not present

## 2019-11-09 DIAGNOSIS — I498 Other specified cardiac arrhythmias: Secondary | ICD-10-CM | POA: Diagnosis not present

## 2019-11-09 DIAGNOSIS — N312 Flaccid neuropathic bladder, not elsewhere classified: Secondary | ICD-10-CM | POA: Diagnosis not present

## 2019-11-09 DIAGNOSIS — R262 Difficulty in walking, not elsewhere classified: Secondary | ICD-10-CM | POA: Diagnosis not present

## 2019-11-09 DIAGNOSIS — R300 Dysuria: Secondary | ICD-10-CM | POA: Diagnosis not present

## 2019-11-09 DIAGNOSIS — E46 Unspecified protein-calorie malnutrition: Secondary | ICD-10-CM | POA: Diagnosis not present

## 2019-11-09 DIAGNOSIS — E7800 Pure hypercholesterolemia, unspecified: Secondary | ICD-10-CM | POA: Diagnosis not present

## 2019-11-09 DIAGNOSIS — R9431 Abnormal electrocardiogram [ECG] [EKG]: Secondary | ICD-10-CM | POA: Diagnosis not present

## 2019-11-09 DIAGNOSIS — R339 Retention of urine, unspecified: Secondary | ICD-10-CM | POA: Diagnosis not present

## 2019-11-09 DIAGNOSIS — K5900 Constipation, unspecified: Secondary | ICD-10-CM | POA: Diagnosis not present

## 2019-11-11 DIAGNOSIS — S72402D Unspecified fracture of lower end of left femur, subsequent encounter for closed fracture with routine healing: Secondary | ICD-10-CM | POA: Diagnosis not present

## 2019-11-11 DIAGNOSIS — F05 Delirium due to known physiological condition: Secondary | ICD-10-CM | POA: Diagnosis not present

## 2019-11-11 DIAGNOSIS — M6281 Muscle weakness (generalized): Secondary | ICD-10-CM | POA: Diagnosis not present

## 2019-11-11 DIAGNOSIS — R339 Retention of urine, unspecified: Secondary | ICD-10-CM | POA: Diagnosis not present

## 2019-11-12 DIAGNOSIS — R82998 Other abnormal findings in urine: Secondary | ICD-10-CM | POA: Diagnosis not present

## 2019-11-12 DIAGNOSIS — R0989 Other specified symptoms and signs involving the circulatory and respiratory systems: Secondary | ICD-10-CM | POA: Diagnosis not present

## 2019-11-12 DIAGNOSIS — M6281 Muscle weakness (generalized): Secondary | ICD-10-CM | POA: Diagnosis not present

## 2019-11-12 DIAGNOSIS — S72402D Unspecified fracture of lower end of left femur, subsequent encounter for closed fracture with routine healing: Secondary | ICD-10-CM | POA: Diagnosis not present

## 2019-11-14 DIAGNOSIS — S7292XD Unspecified fracture of left femur, subsequent encounter for closed fracture with routine healing: Secondary | ICD-10-CM | POA: Diagnosis not present

## 2019-11-14 DIAGNOSIS — I214 Non-ST elevation (NSTEMI) myocardial infarction: Secondary | ICD-10-CM | POA: Diagnosis not present

## 2019-11-14 DIAGNOSIS — I10 Essential (primary) hypertension: Secondary | ICD-10-CM | POA: Diagnosis not present

## 2019-11-14 DIAGNOSIS — E785 Hyperlipidemia, unspecified: Secondary | ICD-10-CM | POA: Diagnosis not present

## 2019-11-15 DIAGNOSIS — K5900 Constipation, unspecified: Secondary | ICD-10-CM | POA: Diagnosis not present

## 2019-11-15 DIAGNOSIS — N390 Urinary tract infection, site not specified: Secondary | ICD-10-CM | POA: Diagnosis not present

## 2019-11-15 DIAGNOSIS — R339 Retention of urine, unspecified: Secondary | ICD-10-CM | POA: Diagnosis not present

## 2019-11-15 DIAGNOSIS — R140 Abdominal distension (gaseous): Secondary | ICD-10-CM | POA: Diagnosis not present

## 2019-11-18 DIAGNOSIS — F05 Delirium due to known physiological condition: Secondary | ICD-10-CM | POA: Diagnosis not present

## 2019-11-18 DIAGNOSIS — F331 Major depressive disorder, recurrent, moderate: Secondary | ICD-10-CM | POA: Diagnosis not present

## 2019-11-18 DIAGNOSIS — M6281 Muscle weakness (generalized): Secondary | ICD-10-CM | POA: Diagnosis not present

## 2019-11-18 DIAGNOSIS — F411 Generalized anxiety disorder: Secondary | ICD-10-CM | POA: Diagnosis not present

## 2019-11-20 DIAGNOSIS — R339 Retention of urine, unspecified: Secondary | ICD-10-CM | POA: Diagnosis not present

## 2019-11-20 DIAGNOSIS — S72402D Unspecified fracture of lower end of left femur, subsequent encounter for closed fracture with routine healing: Secondary | ICD-10-CM | POA: Diagnosis not present

## 2019-11-20 DIAGNOSIS — R300 Dysuria: Secondary | ICD-10-CM | POA: Diagnosis not present

## 2019-11-20 DIAGNOSIS — R3914 Feeling of incomplete bladder emptying: Secondary | ICD-10-CM | POA: Diagnosis not present

## 2019-11-20 DIAGNOSIS — M6281 Muscle weakness (generalized): Secondary | ICD-10-CM | POA: Diagnosis not present

## 2019-11-20 DIAGNOSIS — N312 Flaccid neuropathic bladder, not elsewhere classified: Secondary | ICD-10-CM | POA: Diagnosis not present

## 2019-11-21 DIAGNOSIS — M6281 Muscle weakness (generalized): Secondary | ICD-10-CM | POA: Diagnosis not present

## 2019-11-21 DIAGNOSIS — R339 Retention of urine, unspecified: Secondary | ICD-10-CM | POA: Diagnosis not present

## 2019-11-21 DIAGNOSIS — R300 Dysuria: Secondary | ICD-10-CM | POA: Diagnosis not present

## 2019-11-21 DIAGNOSIS — S72402D Unspecified fracture of lower end of left femur, subsequent encounter for closed fracture with routine healing: Secondary | ICD-10-CM | POA: Diagnosis not present

## 2019-11-22 DIAGNOSIS — R140 Abdominal distension (gaseous): Secondary | ICD-10-CM | POA: Diagnosis not present

## 2019-11-22 DIAGNOSIS — K5900 Constipation, unspecified: Secondary | ICD-10-CM | POA: Diagnosis not present

## 2019-11-22 DIAGNOSIS — R1084 Generalized abdominal pain: Secondary | ICD-10-CM | POA: Diagnosis not present

## 2019-11-22 DIAGNOSIS — N390 Urinary tract infection, site not specified: Secondary | ICD-10-CM | POA: Diagnosis not present

## 2019-11-25 DIAGNOSIS — N390 Urinary tract infection, site not specified: Secondary | ICD-10-CM | POA: Diagnosis not present

## 2019-11-25 DIAGNOSIS — R140 Abdominal distension (gaseous): Secondary | ICD-10-CM | POA: Diagnosis not present

## 2019-11-25 DIAGNOSIS — R1084 Generalized abdominal pain: Secondary | ICD-10-CM | POA: Diagnosis not present

## 2019-11-25 DIAGNOSIS — K5900 Constipation, unspecified: Secondary | ICD-10-CM | POA: Diagnosis not present

## 2019-11-26 DIAGNOSIS — K5900 Constipation, unspecified: Secondary | ICD-10-CM | POA: Diagnosis not present

## 2019-11-26 DIAGNOSIS — R1084 Generalized abdominal pain: Secondary | ICD-10-CM | POA: Diagnosis not present

## 2019-11-26 DIAGNOSIS — N390 Urinary tract infection, site not specified: Secondary | ICD-10-CM | POA: Diagnosis not present

## 2019-11-26 DIAGNOSIS — R140 Abdominal distension (gaseous): Secondary | ICD-10-CM | POA: Diagnosis not present

## 2019-11-27 DIAGNOSIS — K5900 Constipation, unspecified: Secondary | ICD-10-CM | POA: Diagnosis not present

## 2019-11-27 DIAGNOSIS — R140 Abdominal distension (gaseous): Secondary | ICD-10-CM | POA: Diagnosis not present

## 2019-11-27 DIAGNOSIS — N390 Urinary tract infection, site not specified: Secondary | ICD-10-CM | POA: Diagnosis not present

## 2019-11-27 DIAGNOSIS — R339 Retention of urine, unspecified: Secondary | ICD-10-CM | POA: Diagnosis not present

## 2019-11-28 DIAGNOSIS — R140 Abdominal distension (gaseous): Secondary | ICD-10-CM | POA: Diagnosis not present

## 2019-11-28 DIAGNOSIS — R1011 Right upper quadrant pain: Secondary | ICD-10-CM | POA: Diagnosis not present

## 2019-11-29 DIAGNOSIS — S72402D Unspecified fracture of lower end of left femur, subsequent encounter for closed fracture with routine healing: Secondary | ICD-10-CM | POA: Diagnosis not present

## 2019-11-29 DIAGNOSIS — I2699 Other pulmonary embolism without acute cor pulmonale: Secondary | ICD-10-CM | POA: Diagnosis not present

## 2019-11-29 DIAGNOSIS — M6281 Muscle weakness (generalized): Secondary | ICD-10-CM | POA: Diagnosis not present

## 2019-12-02 DIAGNOSIS — N390 Urinary tract infection, site not specified: Secondary | ICD-10-CM | POA: Diagnosis not present

## 2019-12-02 DIAGNOSIS — Z87440 Personal history of urinary (tract) infections: Secondary | ICD-10-CM | POA: Diagnosis not present

## 2019-12-02 DIAGNOSIS — K5900 Constipation, unspecified: Secondary | ICD-10-CM | POA: Diagnosis not present

## 2019-12-02 DIAGNOSIS — F331 Major depressive disorder, recurrent, moderate: Secondary | ICD-10-CM | POA: Diagnosis not present

## 2019-12-02 DIAGNOSIS — S72452D Displaced supracondylar fracture without intracondylar extension of lower end of left femur, subsequent encounter for closed fracture with routine healing: Secondary | ICD-10-CM | POA: Diagnosis not present

## 2019-12-04 DIAGNOSIS — R1084 Generalized abdominal pain: Secondary | ICD-10-CM | POA: Diagnosis not present

## 2019-12-04 DIAGNOSIS — R140 Abdominal distension (gaseous): Secondary | ICD-10-CM | POA: Diagnosis not present

## 2019-12-04 DIAGNOSIS — K5900 Constipation, unspecified: Secondary | ICD-10-CM | POA: Diagnosis not present

## 2019-12-04 DIAGNOSIS — F05 Delirium due to known physiological condition: Secondary | ICD-10-CM | POA: Diagnosis not present

## 2019-12-05 DIAGNOSIS — K5900 Constipation, unspecified: Secondary | ICD-10-CM | POA: Diagnosis not present

## 2019-12-05 DIAGNOSIS — F331 Major depressive disorder, recurrent, moderate: Secondary | ICD-10-CM | POA: Diagnosis not present

## 2019-12-05 DIAGNOSIS — R339 Retention of urine, unspecified: Secondary | ICD-10-CM | POA: Diagnosis not present

## 2019-12-05 DIAGNOSIS — N390 Urinary tract infection, site not specified: Secondary | ICD-10-CM | POA: Diagnosis not present

## 2019-12-06 DIAGNOSIS — M6281 Muscle weakness (generalized): Secondary | ICD-10-CM | POA: Diagnosis not present

## 2019-12-06 DIAGNOSIS — Z7409 Other reduced mobility: Secondary | ICD-10-CM | POA: Diagnosis not present

## 2019-12-06 DIAGNOSIS — K5900 Constipation, unspecified: Secondary | ICD-10-CM | POA: Diagnosis not present

## 2019-12-09 DIAGNOSIS — Z7409 Other reduced mobility: Secondary | ICD-10-CM | POA: Diagnosis not present

## 2019-12-09 DIAGNOSIS — M6281 Muscle weakness (generalized): Secondary | ICD-10-CM | POA: Diagnosis not present

## 2019-12-09 DIAGNOSIS — K5900 Constipation, unspecified: Secondary | ICD-10-CM | POA: Diagnosis not present

## 2019-12-10 DIAGNOSIS — N9989 Other postprocedural complications and disorders of genitourinary system: Secondary | ICD-10-CM | POA: Diagnosis not present

## 2019-12-10 DIAGNOSIS — R338 Other retention of urine: Secondary | ICD-10-CM | POA: Diagnosis not present

## 2019-12-11 DIAGNOSIS — F411 Generalized anxiety disorder: Secondary | ICD-10-CM | POA: Diagnosis not present

## 2019-12-11 DIAGNOSIS — F332 Major depressive disorder, recurrent severe without psychotic features: Secondary | ICD-10-CM | POA: Diagnosis not present

## 2019-12-11 DIAGNOSIS — K5900 Constipation, unspecified: Secondary | ICD-10-CM | POA: Diagnosis not present

## 2019-12-11 DIAGNOSIS — M6281 Muscle weakness (generalized): Secondary | ICD-10-CM | POA: Diagnosis not present

## 2019-12-12 DIAGNOSIS — F320 Major depressive disorder, single episode, mild: Secondary | ICD-10-CM | POA: Diagnosis not present

## 2019-12-12 DIAGNOSIS — I10 Essential (primary) hypertension: Secondary | ICD-10-CM | POA: Diagnosis not present

## 2019-12-13 DIAGNOSIS — N390 Urinary tract infection, site not specified: Secondary | ICD-10-CM | POA: Diagnosis not present

## 2019-12-13 DIAGNOSIS — R310 Gross hematuria: Secondary | ICD-10-CM | POA: Diagnosis not present

## 2019-12-13 DIAGNOSIS — R5383 Other fatigue: Secondary | ICD-10-CM | POA: Diagnosis not present

## 2019-12-13 DIAGNOSIS — R5381 Other malaise: Secondary | ICD-10-CM | POA: Diagnosis not present

## 2019-12-16 DIAGNOSIS — S72402D Unspecified fracture of lower end of left femur, subsequent encounter for closed fracture with routine healing: Secondary | ICD-10-CM | POA: Diagnosis not present

## 2019-12-16 DIAGNOSIS — F332 Major depressive disorder, recurrent severe without psychotic features: Secondary | ICD-10-CM | POA: Diagnosis not present

## 2019-12-16 DIAGNOSIS — F05 Delirium due to known physiological condition: Secondary | ICD-10-CM | POA: Diagnosis not present

## 2019-12-16 DIAGNOSIS — N390 Urinary tract infection, site not specified: Secondary | ICD-10-CM | POA: Diagnosis not present

## 2019-12-20 DIAGNOSIS — F320 Major depressive disorder, single episode, mild: Secondary | ICD-10-CM | POA: Diagnosis not present

## 2019-12-20 DIAGNOSIS — M6281 Muscle weakness (generalized): Secondary | ICD-10-CM | POA: Diagnosis not present

## 2019-12-20 DIAGNOSIS — F332 Major depressive disorder, recurrent severe without psychotic features: Secondary | ICD-10-CM | POA: Diagnosis not present

## 2019-12-20 DIAGNOSIS — K5900 Constipation, unspecified: Secondary | ICD-10-CM | POA: Diagnosis not present

## 2019-12-20 DIAGNOSIS — S72402D Unspecified fracture of lower end of left femur, subsequent encounter for closed fracture with routine healing: Secondary | ICD-10-CM | POA: Diagnosis not present

## 2019-12-24 DIAGNOSIS — E7800 Pure hypercholesterolemia, unspecified: Secondary | ICD-10-CM | POA: Diagnosis not present

## 2019-12-24 DIAGNOSIS — R339 Retention of urine, unspecified: Secondary | ICD-10-CM | POA: Diagnosis not present

## 2019-12-24 DIAGNOSIS — S72042D Displaced fracture of base of neck of left femur, subsequent encounter for closed fracture with routine healing: Secondary | ICD-10-CM | POA: Diagnosis not present

## 2019-12-24 DIAGNOSIS — M1990 Unspecified osteoarthritis, unspecified site: Secondary | ICD-10-CM | POA: Diagnosis not present

## 2019-12-24 DIAGNOSIS — R262 Difficulty in walking, not elsewhere classified: Secondary | ICD-10-CM | POA: Diagnosis not present

## 2019-12-24 DIAGNOSIS — L209 Atopic dermatitis, unspecified: Secondary | ICD-10-CM | POA: Diagnosis not present

## 2019-12-24 DIAGNOSIS — I214 Non-ST elevation (NSTEMI) myocardial infarction: Secondary | ICD-10-CM | POA: Diagnosis not present

## 2019-12-24 DIAGNOSIS — K5900 Constipation, unspecified: Secondary | ICD-10-CM | POA: Diagnosis not present

## 2019-12-24 DIAGNOSIS — E46 Unspecified protein-calorie malnutrition: Secondary | ICD-10-CM | POA: Diagnosis not present

## 2019-12-24 DIAGNOSIS — Z853 Personal history of malignant neoplasm of breast: Secondary | ICD-10-CM | POA: Diagnosis not present

## 2019-12-24 DIAGNOSIS — I5032 Chronic diastolic (congestive) heart failure: Secondary | ICD-10-CM | POA: Diagnosis not present

## 2019-12-24 DIAGNOSIS — I10 Essential (primary) hypertension: Secondary | ICD-10-CM | POA: Diagnosis not present

## 2019-12-24 DIAGNOSIS — S72402D Unspecified fracture of lower end of left femur, subsequent encounter for closed fracture with routine healing: Secondary | ICD-10-CM | POA: Diagnosis not present

## 2019-12-24 DIAGNOSIS — R197 Diarrhea, unspecified: Secondary | ICD-10-CM | POA: Diagnosis not present

## 2019-12-24 DIAGNOSIS — I498 Other specified cardiac arrhythmias: Secondary | ICD-10-CM | POA: Diagnosis not present

## 2019-12-24 DIAGNOSIS — N390 Urinary tract infection, site not specified: Secondary | ICD-10-CM | POA: Diagnosis not present

## 2019-12-24 DIAGNOSIS — M6281 Muscle weakness (generalized): Secondary | ICD-10-CM | POA: Diagnosis not present

## 2019-12-24 DIAGNOSIS — R451 Restlessness and agitation: Secondary | ICD-10-CM | POA: Diagnosis not present

## 2019-12-24 DIAGNOSIS — Z86711 Personal history of pulmonary embolism: Secondary | ICD-10-CM | POA: Diagnosis not present

## 2019-12-24 DIAGNOSIS — L299 Pruritus, unspecified: Secondary | ICD-10-CM | POA: Diagnosis not present

## 2019-12-24 DIAGNOSIS — S72452D Displaced supracondylar fracture without intracondylar extension of lower end of left femur, subsequent encounter for closed fracture with routine healing: Secondary | ICD-10-CM | POA: Diagnosis not present

## 2019-12-24 DIAGNOSIS — R9431 Abnormal electrocardiogram [ECG] [EKG]: Secondary | ICD-10-CM | POA: Diagnosis not present

## 2019-12-24 DIAGNOSIS — I472 Ventricular tachycardia: Secondary | ICD-10-CM | POA: Diagnosis not present

## 2019-12-24 DIAGNOSIS — M810 Age-related osteoporosis without current pathological fracture: Secondary | ICD-10-CM | POA: Diagnosis not present

## 2019-12-24 DIAGNOSIS — I9581 Postprocedural hypotension: Secondary | ICD-10-CM | POA: Diagnosis not present

## 2019-12-26 DIAGNOSIS — R197 Diarrhea, unspecified: Secondary | ICD-10-CM | POA: Diagnosis not present

## 2019-12-26 DIAGNOSIS — M6281 Muscle weakness (generalized): Secondary | ICD-10-CM | POA: Diagnosis not present

## 2019-12-26 DIAGNOSIS — L209 Atopic dermatitis, unspecified: Secondary | ICD-10-CM | POA: Diagnosis not present

## 2019-12-26 DIAGNOSIS — N390 Urinary tract infection, site not specified: Secondary | ICD-10-CM | POA: Diagnosis not present

## 2019-12-30 DIAGNOSIS — K5900 Constipation, unspecified: Secondary | ICD-10-CM | POA: Diagnosis not present

## 2019-12-30 DIAGNOSIS — R451 Restlessness and agitation: Secondary | ICD-10-CM | POA: Diagnosis not present

## 2019-12-30 DIAGNOSIS — S72452D Displaced supracondylar fracture without intracondylar extension of lower end of left femur, subsequent encounter for closed fracture with routine healing: Secondary | ICD-10-CM | POA: Diagnosis not present

## 2019-12-30 DIAGNOSIS — R339 Retention of urine, unspecified: Secondary | ICD-10-CM | POA: Diagnosis not present

## 2019-12-30 DIAGNOSIS — S72402D Unspecified fracture of lower end of left femur, subsequent encounter for closed fracture with routine healing: Secondary | ICD-10-CM | POA: Diagnosis not present

## 2020-01-06 DIAGNOSIS — R339 Retention of urine, unspecified: Secondary | ICD-10-CM | POA: Diagnosis not present

## 2020-01-06 DIAGNOSIS — K5900 Constipation, unspecified: Secondary | ICD-10-CM | POA: Diagnosis not present

## 2020-01-06 DIAGNOSIS — R5381 Other malaise: Secondary | ICD-10-CM | POA: Diagnosis not present

## 2020-01-06 DIAGNOSIS — R3915 Urgency of urination: Secondary | ICD-10-CM | POA: Diagnosis not present

## 2020-01-07 DIAGNOSIS — R3915 Urgency of urination: Secondary | ICD-10-CM | POA: Diagnosis not present

## 2020-01-07 DIAGNOSIS — Z79899 Other long term (current) drug therapy: Secondary | ICD-10-CM | POA: Diagnosis not present

## 2020-01-07 DIAGNOSIS — R339 Retention of urine, unspecified: Secondary | ICD-10-CM | POA: Diagnosis not present

## 2020-01-07 DIAGNOSIS — Z87440 Personal history of urinary (tract) infections: Secondary | ICD-10-CM | POA: Diagnosis not present

## 2020-01-09 DIAGNOSIS — N9989 Other postprocedural complications and disorders of genitourinary system: Secondary | ICD-10-CM | POA: Diagnosis not present

## 2020-01-09 DIAGNOSIS — Z466 Encounter for fitting and adjustment of urinary device: Secondary | ICD-10-CM | POA: Diagnosis not present

## 2020-01-09 DIAGNOSIS — R338 Other retention of urine: Secondary | ICD-10-CM | POA: Diagnosis not present

## 2020-01-10 DIAGNOSIS — N390 Urinary tract infection, site not specified: Secondary | ICD-10-CM | POA: Diagnosis not present

## 2020-01-13 DIAGNOSIS — R300 Dysuria: Secondary | ICD-10-CM | POA: Diagnosis not present

## 2020-01-13 DIAGNOSIS — N390 Urinary tract infection, site not specified: Secondary | ICD-10-CM | POA: Diagnosis not present

## 2020-01-20 DIAGNOSIS — K5900 Constipation, unspecified: Secondary | ICD-10-CM | POA: Diagnosis not present

## 2020-01-20 DIAGNOSIS — R140 Abdominal distension (gaseous): Secondary | ICD-10-CM | POA: Diagnosis not present

## 2020-01-20 DIAGNOSIS — R339 Retention of urine, unspecified: Secondary | ICD-10-CM | POA: Diagnosis not present

## 2020-01-20 DIAGNOSIS — N390 Urinary tract infection, site not specified: Secondary | ICD-10-CM | POA: Diagnosis not present

## 2020-01-21 DIAGNOSIS — F0630 Mood disorder due to known physiological condition, unspecified: Secondary | ICD-10-CM | POA: Diagnosis not present

## 2020-01-23 DIAGNOSIS — R338 Other retention of urine: Secondary | ICD-10-CM | POA: Diagnosis not present

## 2020-01-23 DIAGNOSIS — Z466 Encounter for fitting and adjustment of urinary device: Secondary | ICD-10-CM | POA: Diagnosis not present

## 2020-01-23 DIAGNOSIS — R140 Abdominal distension (gaseous): Secondary | ICD-10-CM | POA: Diagnosis not present

## 2020-01-23 DIAGNOSIS — R339 Retention of urine, unspecified: Secondary | ICD-10-CM | POA: Diagnosis not present

## 2020-01-23 DIAGNOSIS — N9989 Other postprocedural complications and disorders of genitourinary system: Secondary | ICD-10-CM | POA: Diagnosis not present

## 2020-01-23 DIAGNOSIS — R197 Diarrhea, unspecified: Secondary | ICD-10-CM | POA: Diagnosis not present

## 2020-01-24 DIAGNOSIS — M1990 Unspecified osteoarthritis, unspecified site: Secondary | ICD-10-CM | POA: Diagnosis not present

## 2020-01-24 DIAGNOSIS — R262 Difficulty in walking, not elsewhere classified: Secondary | ICD-10-CM | POA: Diagnosis not present

## 2020-01-24 DIAGNOSIS — I5032 Chronic diastolic (congestive) heart failure: Secondary | ICD-10-CM | POA: Diagnosis not present

## 2020-01-24 DIAGNOSIS — I10 Essential (primary) hypertension: Secondary | ICD-10-CM | POA: Diagnosis not present

## 2020-01-30 DIAGNOSIS — M4856XD Collapsed vertebra, not elsewhere classified, lumbar region, subsequent encounter for fracture with routine healing: Secondary | ICD-10-CM | POA: Diagnosis not present

## 2020-01-30 DIAGNOSIS — M80052D Age-related osteoporosis with current pathological fracture, left femur, subsequent encounter for fracture with routine healing: Secondary | ICD-10-CM | POA: Diagnosis not present

## 2020-01-30 DIAGNOSIS — M5021 Other cervical disc displacement,  high cervical region: Secondary | ICD-10-CM | POA: Diagnosis not present

## 2020-01-30 DIAGNOSIS — M160 Bilateral primary osteoarthritis of hip: Secondary | ICD-10-CM | POA: Diagnosis not present

## 2020-01-30 DIAGNOSIS — I252 Old myocardial infarction: Secondary | ICD-10-CM | POA: Diagnosis not present

## 2020-01-30 DIAGNOSIS — M47812 Spondylosis without myelopathy or radiculopathy, cervical region: Secondary | ICD-10-CM | POA: Diagnosis not present

## 2020-01-30 DIAGNOSIS — I2694 Multiple subsegmental pulmonary emboli without acute cor pulmonale: Secondary | ICD-10-CM | POA: Diagnosis not present

## 2020-01-30 DIAGNOSIS — I5032 Chronic diastolic (congestive) heart failure: Secondary | ICD-10-CM | POA: Diagnosis not present

## 2020-01-30 DIAGNOSIS — M1712 Unilateral primary osteoarthritis, left knee: Secondary | ICD-10-CM | POA: Diagnosis not present

## 2020-01-30 DIAGNOSIS — I700 Atherosclerosis of aorta: Secondary | ICD-10-CM | POA: Diagnosis not present

## 2020-01-30 DIAGNOSIS — M85862 Other specified disorders of bone density and structure, left lower leg: Secondary | ICD-10-CM | POA: Diagnosis not present

## 2020-01-30 DIAGNOSIS — I129 Hypertensive chronic kidney disease with stage 1 through stage 4 chronic kidney disease, or unspecified chronic kidney disease: Secondary | ICD-10-CM | POA: Diagnosis not present

## 2020-01-30 DIAGNOSIS — M4854XD Collapsed vertebra, not elsewhere classified, thoracic region, subsequent encounter for fracture with routine healing: Secondary | ICD-10-CM | POA: Diagnosis not present

## 2020-01-30 DIAGNOSIS — J9601 Acute respiratory failure with hypoxia: Secondary | ICD-10-CM | POA: Diagnosis not present

## 2020-01-30 DIAGNOSIS — I4891 Unspecified atrial fibrillation: Secondary | ICD-10-CM | POA: Diagnosis not present

## 2020-01-30 DIAGNOSIS — I2510 Atherosclerotic heart disease of native coronary artery without angina pectoris: Secondary | ICD-10-CM | POA: Diagnosis not present

## 2020-02-03 DIAGNOSIS — I252 Old myocardial infarction: Secondary | ICD-10-CM | POA: Diagnosis not present

## 2020-02-03 DIAGNOSIS — I2699 Other pulmonary embolism without acute cor pulmonale: Secondary | ICD-10-CM | POA: Diagnosis not present

## 2020-02-03 DIAGNOSIS — Z969 Presence of functional implant, unspecified: Secondary | ICD-10-CM | POA: Diagnosis not present

## 2020-02-03 DIAGNOSIS — I5032 Chronic diastolic (congestive) heart failure: Secondary | ICD-10-CM | POA: Diagnosis not present

## 2020-02-05 DIAGNOSIS — S72452D Displaced supracondylar fracture without intracondylar extension of lower end of left femur, subsequent encounter for closed fracture with routine healing: Secondary | ICD-10-CM | POA: Diagnosis not present

## 2020-02-07 ENCOUNTER — Telehealth: Payer: Self-pay | Admitting: Cardiovascular Disease

## 2020-02-07 NOTE — Telephone Encounter (Signed)
Could we please find out what her BP is?

## 2020-02-07 NOTE — Telephone Encounter (Signed)
Returned call to patient's daughter Magda Paganini no answer.[place].

## 2020-02-07 NOTE — Telephone Encounter (Signed)
Spoke to patient's daughter Magda Paganini.Dr.Croitoru's advice given.Stated mother is taking Losartan 25 mg 1/2 tablet daily.She will not take Triamterene.She will monitor B/P and call back Monday to report readings.

## 2020-02-07 NOTE — Telephone Encounter (Signed)
Spoke to patient's daughter Magda Paganini.She stated mother fractured leg in April.She has been at rehab.Stated she came back home this past Tue 7/13.She did not take Triamterene while in rehab.She restarted Triamterene 25 mg 1/2 tablet every day.She wanted to make sure ok to take.Message sent to Dr.Croitoru for advice.

## 2020-02-07 NOTE — Telephone Encounter (Signed)
If she is taking losartan (as our chart states) I would not take the triamterene as well

## 2020-02-07 NOTE — Telephone Encounter (Signed)
New Message:       Daughter called and wante d to know if pt is to continue taking her Triamterene?

## 2020-02-07 NOTE — Telephone Encounter (Signed)
Patient returning call.

## 2020-02-10 ENCOUNTER — Telehealth: Payer: Self-pay | Admitting: Cardiovascular Disease

## 2020-02-10 NOTE — Telephone Encounter (Signed)
Routed to MD to review-BP readings requested per last phone note

## 2020-02-10 NOTE — Telephone Encounter (Signed)
All of those readings are well within desirable range. Continue current meds. Do not take triamterene.

## 2020-02-10 NOTE — Telephone Encounter (Signed)
Spoke to patient, aware and verbalized understanding.    

## 2020-02-10 NOTE — Telephone Encounter (Signed)
Pt c/o BP issue: STAT if pt c/o blurred vision, one-sided weakness or slurred speech  1. What are your last 5 BP readings?  132/64 Friday  107/61 Saturday  102/56 Sunday  123/62 Monday   2. Are you having any other symptoms (ex. Dizziness, headache, blurred vision, passed out)? No   3. What is your BP issue? [name] was advised to call in with BP readings. Please advise.

## 2020-02-19 ENCOUNTER — Telehealth: Payer: Self-pay | Admitting: Cardiovascular Disease

## 2020-02-19 DIAGNOSIS — S72452D Displaced supracondylar fracture without intracondylar extension of lower end of left femur, subsequent encounter for closed fracture with routine healing: Secondary | ICD-10-CM | POA: Diagnosis not present

## 2020-02-19 MED ORDER — FUROSEMIDE 40 MG PO TABS: 40.0000 mg | ORAL_TABLET | ORAL | 0 refills | Status: DC

## 2020-02-19 NOTE — Telephone Encounter (Signed)
I spoke with [name]. Please do not take the triamterene HCTZ. That will drop the BP too much. Would rather use intermittent furosemide. Please Rx furosemide 40 mg tabs # 30. Do not take it later than 6 h before bedtime. Have her take daily for the first 3 days, until the swelling gets better. After that, take only three days a week (for example Tue, Thu, Sat). Eat plenty of potassium rich foods. Please keep a log of daily weights. thanks

## 2020-02-19 NOTE — Telephone Encounter (Signed)
The prescription has been sent in for the patient.

## 2020-02-19 NOTE — Telephone Encounter (Signed)
Pt c/o swelling: STAT is pt has developed SOB within 24 hours  1) How much weight have you gained and in what time span? No  2) If swelling, where is the swelling located? Bilateral legs  3) Are you currently taking a fluid pill? No  4) Are you currently SOB? No  5) Do you have a log of your daily weights (if so, list)? No  6) Have you gained 3 pounds in a day or 5 pounds in a week? No  7) Have you traveled recently? No    Patients daughter, Magda Paganini, said that since 7/16, patient hasn't taken Triamterene, she has been having leg swelling and the skin is hard. Magda Paganini stated the patient had to go see Dr. Marcelino Scot, and his PA recommended that the patient go back on Triamterene and go to a whole pill instead of a half pill.

## 2020-02-19 NOTE — Telephone Encounter (Signed)
Returned the call to the patient and the daughter Magda Paganini. Magda Paganini stated that the patient had an appointment with PCP today and the PA advised the paitent to increase her Triamterene-Hydrochlorothiazide 75-50 mg once daily to a whole tablet. The PA was under the impression that the patient had been taking half a tablet. She has not had the triamterene-hctz sine July 16th.  According to the patient, the PA wanted to get Dr. Victorino December opinion on taking a whole tablet. The patient has been having bilateral edema. She denies any shortness of breath and weight gain. She has started wearing compression stockings to see if that may help. She elevates her legs during the day. The swelling does not get better overnight.   Blood pressures (she did not have heart rates): 7/27 132/82 7/26 114/52 7/25 139/75  The blood pressures were taken after the Losartan 12.5 mg.

## 2020-02-20 ENCOUNTER — Other Ambulatory Visit (HOSPITAL_COMMUNITY): Payer: Self-pay | Admitting: Orthopedic Surgery

## 2020-02-20 ENCOUNTER — Other Ambulatory Visit: Payer: Self-pay | Admitting: Orthopedic Surgery

## 2020-02-20 DIAGNOSIS — M7989 Other specified soft tissue disorders: Secondary | ICD-10-CM

## 2020-02-20 DIAGNOSIS — E2839 Other primary ovarian failure: Secondary | ICD-10-CM

## 2020-02-21 ENCOUNTER — Other Ambulatory Visit: Payer: Self-pay

## 2020-02-21 ENCOUNTER — Ambulatory Visit (HOSPITAL_COMMUNITY): Payer: Medicare Other

## 2020-02-21 ENCOUNTER — Ambulatory Visit (HOSPITAL_COMMUNITY)
Admission: RE | Admit: 2020-02-21 | Discharge: 2020-02-21 | Disposition: A | Discharge status: Home / Self Care | Payer: Medicare Other | Source: Ambulatory Visit | Attending: Orthopedic Surgery | Admitting: Orthopedic Surgery

## 2020-02-21 DIAGNOSIS — M7989 Other specified soft tissue disorders: Secondary | ICD-10-CM | POA: Diagnosis not present

## 2020-02-21 NOTE — Progress Notes (Signed)
Lower extremity venous LT study completed.   Attempted to call. Office closed. Report faxed.   See Cv Proc for preliminary results.   Darlin Coco

## 2020-02-24 ENCOUNTER — Other Ambulatory Visit: Payer: Self-pay | Admitting: Cardiovascular Disease

## 2020-02-26 DIAGNOSIS — M6281 Muscle weakness (generalized): Secondary | ICD-10-CM | POA: Diagnosis not present

## 2020-02-27 ENCOUNTER — Other Ambulatory Visit: Payer: Self-pay | Admitting: Orthopedic Surgery

## 2020-02-27 DIAGNOSIS — Z1231 Encounter for screening mammogram for malignant neoplasm of breast: Secondary | ICD-10-CM

## 2020-03-03 ENCOUNTER — Telehealth: Payer: Self-pay | Admitting: Cardiovascular Disease

## 2020-03-03 DIAGNOSIS — I2510 Atherosclerotic heart disease of native coronary artery without angina pectoris: Secondary | ICD-10-CM | POA: Diagnosis not present

## 2020-03-03 DIAGNOSIS — M5021 Other cervical disc displacement,  high cervical region: Secondary | ICD-10-CM | POA: Diagnosis not present

## 2020-03-03 DIAGNOSIS — I700 Atherosclerosis of aorta: Secondary | ICD-10-CM | POA: Diagnosis not present

## 2020-03-03 DIAGNOSIS — M1712 Unilateral primary osteoarthritis, left knee: Secondary | ICD-10-CM | POA: Diagnosis not present

## 2020-03-03 DIAGNOSIS — M85862 Other specified disorders of bone density and structure, left lower leg: Secondary | ICD-10-CM | POA: Diagnosis not present

## 2020-03-03 DIAGNOSIS — I129 Hypertensive chronic kidney disease with stage 1 through stage 4 chronic kidney disease, or unspecified chronic kidney disease: Secondary | ICD-10-CM | POA: Diagnosis not present

## 2020-03-03 DIAGNOSIS — M80052D Age-related osteoporosis with current pathological fracture, left femur, subsequent encounter for fracture with routine healing: Secondary | ICD-10-CM | POA: Diagnosis not present

## 2020-03-03 DIAGNOSIS — I5032 Chronic diastolic (congestive) heart failure: Secondary | ICD-10-CM | POA: Diagnosis not present

## 2020-03-03 DIAGNOSIS — I252 Old myocardial infarction: Secondary | ICD-10-CM | POA: Diagnosis not present

## 2020-03-03 DIAGNOSIS — I4891 Unspecified atrial fibrillation: Secondary | ICD-10-CM | POA: Diagnosis not present

## 2020-03-03 DIAGNOSIS — M47812 Spondylosis without myelopathy or radiculopathy, cervical region: Secondary | ICD-10-CM | POA: Diagnosis not present

## 2020-03-03 DIAGNOSIS — M4854XD Collapsed vertebra, not elsewhere classified, thoracic region, subsequent encounter for fracture with routine healing: Secondary | ICD-10-CM | POA: Diagnosis not present

## 2020-03-03 DIAGNOSIS — M4856XD Collapsed vertebra, not elsewhere classified, lumbar region, subsequent encounter for fracture with routine healing: Secondary | ICD-10-CM | POA: Diagnosis not present

## 2020-03-03 DIAGNOSIS — M160 Bilateral primary osteoarthritis of hip: Secondary | ICD-10-CM | POA: Diagnosis not present

## 2020-03-03 DIAGNOSIS — J9601 Acute respiratory failure with hypoxia: Secondary | ICD-10-CM | POA: Diagnosis not present

## 2020-03-03 DIAGNOSIS — I2694 Multiple subsegmental pulmonary emboli without acute cor pulmonale: Secondary | ICD-10-CM | POA: Diagnosis not present

## 2020-03-03 NOTE — Telephone Encounter (Signed)
New Message   Pt c/o swelling: STAT is pt has developed SOB within 24 hours  1) How much weight have you gained and in what time span? None  2) If swelling, where is the swelling located? Left Knee   3) Are you currently taking a fluid pill? Yes  4) Are you currently SOB? No  5) Do you have a log of your daily weights (if so, list)? 131 lbs is current weight  6) Have you gained 3 pounds in a day or 5 pounds in a week? No  7) Have you traveled recently? No

## 2020-03-03 NOTE — Telephone Encounter (Signed)
Returned the call to the patient. She is currently taking Furosemide 40 mg three times a week on Monday, Wednesday and Friday. She stated that her ankles and lower legs are not swollen anymore but her left knee is swollen. She denies shortness of breath and weight gain. Current weight is 131 pounds. She stated that she has not had a trauma to the knee and it is not hot. She denies any pain. She has been advised to keep in elevated and if it worsens to call her PCP.

## 2020-03-11 DIAGNOSIS — S72452D Displaced supracondylar fracture without intracondylar extension of lower end of left femur, subsequent encounter for closed fracture with routine healing: Secondary | ICD-10-CM | POA: Diagnosis not present

## 2020-03-11 DIAGNOSIS — M25562 Pain in left knee: Secondary | ICD-10-CM | POA: Diagnosis not present

## 2020-03-25 DIAGNOSIS — I2699 Other pulmonary embolism without acute cor pulmonale: Secondary | ICD-10-CM | POA: Diagnosis not present

## 2020-03-25 DIAGNOSIS — I10 Essential (primary) hypertension: Secondary | ICD-10-CM | POA: Diagnosis not present

## 2020-03-25 DIAGNOSIS — Z969 Presence of functional implant, unspecified: Secondary | ICD-10-CM | POA: Diagnosis not present

## 2020-03-25 DIAGNOSIS — M79604 Pain in right leg: Secondary | ICD-10-CM | POA: Diagnosis not present

## 2020-03-27 ENCOUNTER — Ambulatory Visit: Payer: Medicare Other | Admitting: Cardiovascular Disease

## 2020-03-27 ENCOUNTER — Encounter: Payer: Self-pay | Admitting: Cardiovascular Disease

## 2020-03-27 ENCOUNTER — Other Ambulatory Visit: Payer: Self-pay

## 2020-03-27 VITALS — BP 121/62 | HR 70 | Ht 66.0 in | Wt 137.4 lb

## 2020-03-27 DIAGNOSIS — I472 Ventricular tachycardia: Secondary | ICD-10-CM

## 2020-03-27 DIAGNOSIS — I5022 Chronic systolic (congestive) heart failure: Secondary | ICD-10-CM

## 2020-03-27 DIAGNOSIS — E7800 Pure hypercholesterolemia, unspecified: Secondary | ICD-10-CM

## 2020-03-27 DIAGNOSIS — I2609 Other pulmonary embolism with acute cor pulmonale: Secondary | ICD-10-CM

## 2020-03-27 DIAGNOSIS — I2510 Atherosclerotic heart disease of native coronary artery without angina pectoris: Secondary | ICD-10-CM | POA: Diagnosis not present

## 2020-03-27 DIAGNOSIS — I471 Supraventricular tachycardia: Secondary | ICD-10-CM

## 2020-03-27 DIAGNOSIS — I4729 Other ventricular tachycardia: Secondary | ICD-10-CM

## 2020-03-27 DIAGNOSIS — I951 Orthostatic hypotension: Secondary | ICD-10-CM

## 2020-03-27 DIAGNOSIS — I10 Essential (primary) hypertension: Secondary | ICD-10-CM

## 2020-03-27 DIAGNOSIS — I872 Venous insufficiency (chronic) (peripheral): Secondary | ICD-10-CM

## 2020-03-27 NOTE — Progress Notes (Signed)
Cardiology Office Note    Date:  03/27/2020   ID:  Donney Dice 14-Mar-1932, MRN [id number]  PCP:  Burnard Bunting, MD  Cardiologist:   Sanda Klein, MD   Chief Complaint  Patient presents with  . Leg Swelling    History of Present Illness:  [name] is a 86 y.o. female with paroxysmal atrial tachycardia orthostatic hypotension, hyperlipidemia and lower extremity edema due to venous insufficiency, returning for follow-up.  She is accompanied by her husband, [name].  In April of this year she had a fall complicated by left supracondylar femoral fracture treated with open reduction internal fixation with plates.  She has had a difficult time recovering from this.  Her mobility is still severely reduced although she gets around the house for short distances with a walker.  She needs assistance standing up.  She has been home now for about 4 weeks.  About 2 years ago she had a mechanical fall complicated by a right supracondylar femoral fracture, treated surgically.  She had some issues with sinus bradycardia and borderline hypotension during that hospitalization.  PACs and PVCs were seen.  She has not really had major hemodynamic problems with recurrent hospitalization.  She had some issues with hypotension and her triamterene-hydrochlorothiazide was stopped.  She then became mildly hypertensive with systolic blood pressure in the 160-180 range and low-dose losartan was restarted.  Her husband has been keeping a detailed log of her blood pressure.  Usually her systolic blood pressures around 130, rarely over 140, occasional days with systolic blood pressure in the 100-110 range.  She has not had problems with orthostatic dizziness but she now moves very very slowly.  She has not had additional falls and denies any bleeding problems.  She is fully anticoagulated with Eliquis.  She has bilateral lower extremity edema.  This occurs despite compression stockings.  She takes  furosemide 3 days a week.  Her legs are always more swollen towards the end of the day. She has not had any redness or warmth around the surgical site.  She has not had fever or chills.  She has a remote history of DVT and has prominent bilateral varicose veins and bilateral saphenectomy in the past.   During her hospitalization following the fracture in April she had problems with hypotension, LV dysfunction by echo, troponin increase to the 3600 range, an episode of junctional tachycardia with very distinct retrograde P waves at about 128 bpm.  An echocardiogram performed on April 10 showed LVEF 40-45% and evidence of elevated mean left atrial pressure and elevated right atrial pressure.  There was mild aortic insufficiency and mild-moderate aortic valve sclerosis without stenosis.  Just 2 days later she underwent a Lexiscan Myoview study which showed a medium size, moderate area of reversible ischemia in the mid apical inferoseptal wall, and the EF was up to 69%.  Around the same time, her CT angiogram of the chest showed multiple small bilateral segmental pulmonary emboli with evidence of right ventricular strain.  Venous duplex ultrasound studies were performed on 11/06/2019 02/21/2020 without evidence of DVT in either lower extremity.  She had substantial volume overload when she left the hospital at 152 pounds, but now weighs 137 pounds, very close to her weight last December of 140 pounds.  She denies dyspnea or chest pain either at rest or with activity.  She has not had palpitations or syncope.    She continues to have a lot of pain around the surgical area.  This happens when she applies weight to the limb, but it can also occur when she rotates the leg in bed.  At her May 13, 2018 follow-up episode she did complain of some right sided chest discomfort and a Lexiscan Myoview was ordered.  The symptoms did not sound distinctly anginal.  They did not recur and she canceled the nuclear stress  test.  On the event monitor performed in August 2019, she did not have any evidence of serious arrhythmia.  No major bradycardic events and a single episode of nonsustained VT that occurred when she almost had a motor vehicle accident; with a a maximum heart rate achieved was 106 bpm which is 78% of the maximum predicted for her age  In March 2016, her echocardiogram showed normal findings with the exception of moderate left atrial enlargement and mild diastolic left ventricular dysfunction.  October 2019 echo was essentially unchanged, EF 71-69%, systolic PA pressure mildly increased 38 mmHg.  Past Medical History:  Diagnosis Date  . Arthritis    "in my back"  . Breast cancer ([place]) 1996   left breast cancer   . Complication of anesthesia 04/18/2012   "didn't tolerate it today very well; had the shakes and very hard time w/it"  . Depression 11/20/2012  . DVT (deep venous thrombosis) ([place])   . High cholesterol   . History of alcoholism ([place])    7 1/2 years clean  . Hypertension   . Melanoma ([place]) 01/15/2004   upper right arm  . Osteoporosis   . Peripheral vascular disease ([facility])    hx of ligation   . Personal history of radiation therapy 1993   Left Breast Cancer  . SCC (squamous cell carcinoma) 01/15/2004   left post upper arm, Right elbow, post left knee  . SCC (squamous cell carcinoma) 05/21/2013   right forearm, right jawline, left cheek  . SCC (squamous cell carcinoma) 08/24/2016   right shin,right thigh  . SCC (squamous cell carcinoma) 11/01/2016  . SCC (squamous cell carcinoma) 02/02/2018   left outer arm,right outer thigh  . SCC (squamous cell carcinoma) 12/13/2018   right jawline  . Spinal stenosis     Past Surgical History:  Procedure Laterality Date  . BREAST LUMPECTOMY Left 1996  . I & D EXTREMITY  04/17/2012   Procedure: IRRIGATION AND DEBRIDEMENT EXTREMITY;  Surgeon: Rudean Haskell, MD;  Location: [facility];  Service: Orthopedics;  Laterality: Right;  . KNEE  ARTHROSCOPY  04/17/2012   Procedure: ARTHROSCOPY KNEE;  Surgeon: Rudean Haskell, MD;  Location: [facility];  Service: Orthopedics;  Laterality: Right;  . KYPHOPLASTY  2009, 2013   thorasic, lumbar  . MENISECTOMY Right 04/11/2012  . ORIF FEMUR FRACTURE Left 11/01/2019   Procedure: OPEN REDUCTION INTERNAL FIXATION (ORIF) DISTAL FEMUR FRACTURE;  Surgeon: Altamese [place], MD;  Location: [facility];  Service: Orthopedics;  Laterality: Left;  . ORIF PERIPROSTHETIC FRACTURE Right 05/03/2018   Procedure: RIGHT RETROGRADE FEMORAL NAIL;  Surgeon: Rod Can, MD;  Location: [facility];  Service: Orthopedics;  Laterality: Right;  . POSTERIOR LAMINECTOMY / DECOMPRESSION LUMBAR SPINE  1980's  . rotator cuff surgery  Right   . TONSILLECTOMY AND ADENOIDECTOMY     "I was a child"  . TOTAL KNEE ARTHROPLASTY Right 10/29/2012   Procedure: RIGHT TOTAL KNEE ARTHROPLASTY;  Surgeon: Gearlean Alf, MD;  Location: [facility];  Service: Orthopedics;  Laterality: Right;  . VEIN LIGATION      Current Medications: Outpatient Medications Prior to Visit  Medication Sig Dispense Refill  . acetaminophen (TYLENOL) 500 MG tablet Take 1,000 mg by mouth daily as needed (back pain).    . Amino Acids-Protein Hydrolys (FEEDING SUPPLEMENT, PRO-STAT SUGAR FREE 64,) LIQD Take 30 mLs by mouth 2 (two) times daily. 887 mL 0  . apixaban (ELIQUIS) 5 MG TABS tablet Take 1 tablet (5 mg total) by mouth 2 (two) times daily. 60 tablet   . Calcium Carbonate-Vitamin D (CALCIUM-D) 600-400 MG-UNIT TABS Take 1 tablet by mouth daily.    . furosemide (LASIX) 40 MG tablet TAKE 1 TABLET (40 MG TOTAL) BY MOUTH 3 (THREE) TIMES A WEEK. 30 tablet 0  . losartan (COZAAR) 25 MG tablet Take 0.5 tablets (12.5 mg total) by mouth daily.    . Multiple Vitamins-Minerals (MULTIVITAMIN ADULT) TABS Take 1 tablet by mouth daily.     . polyethylene glycol (MIRALAX / GLYCOLAX) 17 g packet Take 17 g by mouth daily as needed. 14 each 0  . rosuvastatin (CRESTOR) 20 MG tablet Take 1  tablet (20 mg total) by mouth daily at 6 PM.    . senna-docusate (SENOKOT-S) 8.6-50 MG tablet Take 1 tablet by mouth at bedtime.    Marland Kitchen apixaban (ELIQUIS) 5 MG TABS tablet Take 2 tablets (10 mg total) by mouth 2 (two) times daily for 4 days. 60 tablet    No facility-administered medications prior to visit.     Allergies:   Oysters [shellfish allergy] and Codeine   Social History   Socioeconomic History  . Marital status: Married    Spouse name: Not on file  . Number of children: 2  . Years of education: Not on file  . Highest education level: Not on file  Occupational History  . Occupation: retired  Tobacco Use  . Smoking status: Never Smoker  . Smokeless tobacco: Never Used  Vaping Use  . Vaping Use: Never used  Substance and Sexual Activity  . Alcohol use: No  . Drug use: No  . Sexual activity: Not on file  Other Topics Concern  . Not on file  Social History Narrative  . Not on file   Social Determinants of Health   Financial Resource Strain:   . Difficulty of Paying Living Expenses: Not on file  Food Insecurity:   . Worried About Charity fundraiser in the Last Year: Not on file  . Ran Out of Food in the Last Year: Not on file  Transportation Needs:   . Lack of Transportation (Medical): Not on file  . Lack of Transportation (Non-Medical): Not on file  Physical Activity:   . Days of Exercise per Week: Not on file  . Minutes of Exercise per Session: Not on file  Stress:   . Feeling of Stress : Not on file  Social Connections:   . Frequency of Communication with Friends and Family: Not on file  . Frequency of Social Gatherings with Friends and Family: Not on file  . Attends Religious Services: Not on file  . Active Member of Clubs or Organizations: Not on file  . Attends Archivist Meetings: Not on file  . Marital Status: Not on file     Family History:  The patient's family history includes Anuerysm in her father; Kidney failure in her mother.    ROS:   Please see the history of present illness.    ROS all other systems are reviewed and are negative  PHYSICAL EXAM:   VS:  BP 121/62   Pulse 70  Ht 5\' 6"  (1.676 m)   Wt 137 lb 6.4 oz (62.3 kg)   SpO2 100%   BMI 22.18 kg/m      General: Alert, oriented x3, no distress, prominent thoracic kyphosis Head: no evidence of trauma, PERRL, EOMI, no exophtalmos or lid lag, no myxedema, no xanthelasma; normal ears, nose and oropharynx Neck: normal jugular venous pulsations and no hepatojugular reflux; brisk carotid pulses without delay and no carotid bruits Chest: clear to auscultation, no signs of consolidation by percussion or palpation, normal fremitus, symmetrical and full respiratory excursions Cardiovascular: normal position and quality of the apical impulse, regular rhythm, normal first and widely split second heart sounds, no murmurs, rubs or gallops Abdomen: no tenderness or distention, no masses by palpation, no abnormal pulsatility or arterial bruits, normal bowel sounds, no hepatosplenomegaly Extremities: Wearing compression stockings, no clubbing, cyanosis or edema; 2+ radial, ulnar and brachial pulses bilaterally; 2+ right femoral, posterior tibial and dorsalis pedis pulses; 2+ left femoral, posterior tibial and dorsalis pedis pulses; no subclavian or femoral bruits Neurological: grossly nonfocal Psych: Normal mood and affect   Wt Readings from Last 3 Encounters:  03/27/20 137 lb 6.4 oz (62.3 kg)  11/07/19 152 lb 5.4 oz (69.1 kg)  07/17/19 140 lb (63.5 kg)    Studies/Labs Reviewed:   ECHO 11/02/2019   1. False tendon in LV apex of no clinical significance. Left ventricular  ejection fraction, by estimation, is 40 to 45%. The left ventricle has  mildly decreased function. The left ventricle demonstrates global  hypokinesis. There is severe left  ventricular hypertrophy of the basal-septal segment. Left ventricular  diastolic parameters are consistent with Grade II  diastolic dysfunction  (pseudonormalization). Elevated left ventricular end-diastolic pressure.  2. Right ventricular systolic function is moderately reduced. The right  ventricular size is mildly enlarged. There is moderately elevated  pulmonary artery systolic pressure.  3. Right atrial size was severely dilated.  4. The mitral valve is normal in structure. Trivial mitral valve  regurgitation. No evidence of mitral stenosis.  5. The aortic valve is tricuspid. Aortic valve regurgitation is mild.  Mild to moderate aortic valve sclerosis/calcification is present, without  any evidence of aortic stenosis.  6. Aortic dilatation noted. There is mild dilatation of the ascending  aorta measuring 41 mm.  7. The inferior vena cava is dilated in size with <50% respiratory  variability, suggesting right atrial pressure of 15 mmHg.   Lexiscan Myoview 11/04/2019  IMPRESSION: 1. Medium size, moderate severity area of reversibility within the apical to mid segment of the inferoseptal wall. This is suspicious for inducible ischemia.  2. Normal left ventricular wall motion.  3. Left ventricular ejection fraction 69%  4. Non invasive risk stratification*: Intermediate  duplex venous ultrasounds 11/06/2019 and 02/21/2020: no DVT  EKG:  EKG is not ordered today.  The tracing from 11/06/2019 is unchanged from a previous tracings and shows sinus rhythm with old right bundle branch block.  It shows normal sinus rhythm with a single PVC and right bundle branch block.  Normal repolarization, QTC 447 ms  LABS: BMET    Component Value Date/Time   NA 139 11/07/2019 0449   NA 140 08/06/2018 0815   K 3.5 11/07/2019 0449   CL 102 11/07/2019 0449   CO2 29 11/07/2019 0449   GLUCOSE 102 (H) 11/07/2019 0449   BUN 25 (H) 11/07/2019 0449   BUN 32 (H) 08/06/2018 0815   CREATININE 0.58 11/07/2019 0449   CREATININE 0.75 06/13/2012 1057   CALCIUM  8.7 (L) 11/07/2019 0449   GFRNONAA >60 11/07/2019 0449     GFRNONAA 88 05/23/2012 1337   GFRAA >60 11/07/2019 0449   GFRAA >89 05/23/2012 1337   Lipid Panel     Component Value Date/Time   CHOL 133 11/04/2019 0439   TRIG 102 11/04/2019 0439   HDL 44 11/04/2019 0439   CHOLHDL 3.0 11/04/2019 0439   VLDL 20 11/04/2019 0439   LDLCALC 69 11/04/2019 0439   March 27, 2018 Cholesterol 182, HDL 68, LDL 94, triglycerides 98 Hemoglobin A1c 5.7%  August 06, 2018 Creatinine 0.78, potassium 4.5  September 26, 2018 Hemoglobin A1c 5.6%  ASSESSMENT:    1. Chronic systolic heart failure ([place])   2. Coronary artery disease involving native coronary artery of native heart without angina pectoris   3. Other acute pulmonary embolism with acute cor pulmonale ([place])   4. Orthostatic hypotension   5. Essential hypertension, benign   6. NSVT (nonsustained ventricular tachycardia) (H[facility])   7. Junctional tachycardia ([place])   8. Peripheral venous insufficiency   9. Hypercholesterolemia     PLAN:  In order of problems listed above:  .   1. CHF with systolic LV dysfunction: Reviewed the echo from April 2021.  EF was indeed slightly decreased from the previous study and less than normal, but there was also some subtle wall motion abnormality involving the apex in a pattern that may have represented Takotsubo syndrome rather than LAD distribution ischemia.  The study was performed around the same time when she was having rhythm issues.  She is not currently having any manifestations of left heart failure and has never had angina pectoris.  There are no clinical findings to suggest hypervolemia today.   She is requiring very low doses of loop diuretics for lower extremity edema, which precedes the hospitalization and is probably due primarily to peripheral venous insufficiency.  When she had her echocardiogram there was clear evidence of elevated right and left heart pressures, quite possibly due to excessive IV fluid administration and may be also Takotsubo syndrome.   She really does not have any symptoms of left heart failure.  Plan to repeat the echocardiogram when she has improved mobility.  The nuclear study suggest that the LVEF has returned to baseline.  2. CAD: She does not have angina pectoris.  There was a moderate area of reversible ischemia in the mid apical inferoseptal wall.  The CT angiogram of her chest performed for pulmonary embolism on 11/05/2019 showed that the only area of calcification was a very mild plaque in the LAD.  There is also minimal plaque in the thoracic aorta.   I still wonder whether her hemodynamic changes and even the LV function changes could have been due to Takotsubo syndrome in the setting of leg fracture and acute pulmonary embolism.  Even if she does have CAD, the relatively small area of potential ischemia, the absence of angina or active heart failure symptoms as well as her age and comorbid conditions recommend her as best suited for medical therapy with aspirin (to be started if her anticoagulant is stopped), statin.  She did not tolerate beta-blockers. 3. Pulmonary embolism: Provoked by fracture/surgery.  Does have a history of severe superficial venous insufficiency and previous DVT.  Will discuss the overall duration of anticoagulation, but I would continue it until she is fully mobile. 4. Orthostatic hypotension: Avoid excessive diuresis.  Did discuss the difference between edema due to peripheral venous insufficiency and heart failure with Mr. and Mrs.  Marasco.  I recommended avoiding taking the loop diuretic if her systolic blood pressure is less than 150 mmHg. 5. HTN: Overall excellent control on a tiny dose of losartan. 6. PVC/NSVT: No arrhythmia detected today on physical exam.  No major problems with PVCs or VT during her hospitalization.  Her one episode of detected nonsustained VT occurred during extreme emotional event.  Avoid beta-blockers due to her tendency to bradycardia. 7. Junctional tachycardia: Occurred during  acute illness and hypokalemia, no clinical recurrence since then. 8. Peripheral venous insufficiency: Preferentially to be addressed with conservative measures such as leg elevation and compression stockings, rather than diuretics.  9. HLP: On rosuvastatin.  Most recent LDL cholesterol was excellent at 69.  Medication Adjustments/Labs and Tests Ordered: Current medicines are reviewed at length with the patient today.  Concerns regarding medicines are outlined above.  Medication changes, Labs and Tests ordered today are listed in the Patient Instructions below. Patient Instructions  Medication Instructions:  DO NOT take the Furosemide if your systolic (top number) is less than 115.  *If you need a refill on your cardiac medications before your next appointment, please call your pharmacy*   Lab Work: None ordered If you have labs (blood work) drawn today and your tests are completely normal, you will receive your results only by: Marland Kitchen MyChart Message (if you have MyChart) OR . A paper copy in the mail If you have any lab test that is abnormal or we need to change your treatment, we will call you to review the results.   Testing/Procedures: None ordered   Follow-Up: At [facility], you and your health needs are our priority.  As part of our continuing mission to provide you with exceptional heart care, we have created designated Provider Care Teams.  These Care Teams include your primary Cardiologist (physician) and Advanced Practice Providers (APPs -  Physician Assistants and Nurse Practitioners) who all work together to provide you with the care you need, when you need it.  We recommend signing up for the patient portal called "MyChart".  Sign up information is provided on this After Visit Summary.  MyChart is used to connect with patients for Virtual Visits (Telemedicine).  Patients are able to view lab/test results, encounter notes, upcoming appointments, etc.  Non-urgent messages can be  sent to your provider as well.   To learn more about what you can do with MyChart, go to [email or web address].    Your next appointment:   12 month(s)  The format for your next appointment:   In Person  Provider:   Sanda Klein, MD        Signed, Sanda Klein, MD  03/27/2020 5:34 PM    [address], [place], [place]  [address] Phone: [phone]; Fax: [phone]

## 2020-03-27 NOTE — Patient Instructions (Signed)
Medication Instructions:  DO NOT take the Furosemide if your systolic (top number) is less than 115.  *If you need a refill on your cardiac medications before your next appointment, please call your pharmacy*   Lab Work: None ordered If you have labs (blood work) drawn today and your tests are completely normal, you will receive your results only by: Marland Kitchen MyChart Message (if you have MyChart) OR . A paper copy in the mail If you have any lab test that is abnormal or we need to change your treatment, we will call you to review the results.   Testing/Procedures: None ordered   Follow-Up: At [facility], you and your health needs are our priority.  As part of our continuing mission to provide you with exceptional heart care, we have created designated Provider Care Teams.  These Care Teams include your primary Cardiologist (physician) and Advanced Practice Providers (APPs -  Physician Assistants and Nurse Practitioners) who all work together to provide you with the care you need, when you need it.  We recommend signing up for the patient portal called "MyChart".  Sign up information is provided on this After Visit Summary.  MyChart is used to connect with patients for Virtual Visits (Telemedicine).  Patients are able to view lab/test results, encounter notes, upcoming appointments, etc.  Non-urgent messages can be sent to your provider as well.   To learn more about what you can do with MyChart, go to [email or web address].    Your next appointment:   12 month(s)  The format for your next appointment:   In Person  Provider:   Sanda Klein, MD

## 2020-04-13 ENCOUNTER — Other Ambulatory Visit: Payer: Self-pay

## 2020-04-13 MED ORDER — FUROSEMIDE 40 MG PO TABS: 40.0000 mg | ORAL_TABLET | ORAL | 1 refills | Status: DC

## 2020-04-22 DIAGNOSIS — M25562 Pain in left knee: Secondary | ICD-10-CM | POA: Diagnosis not present

## 2020-04-22 DIAGNOSIS — S72452D Displaced supracondylar fracture without intracondylar extension of lower end of left femur, subsequent encounter for closed fracture with routine healing: Secondary | ICD-10-CM | POA: Diagnosis not present

## 2020-04-24 DIAGNOSIS — I10 Essential (primary) hypertension: Secondary | ICD-10-CM | POA: Diagnosis not present

## 2020-04-24 DIAGNOSIS — E1169 Type 2 diabetes mellitus with other specified complication: Secondary | ICD-10-CM | POA: Diagnosis not present

## 2020-04-24 DIAGNOSIS — E785 Hyperlipidemia, unspecified: Secondary | ICD-10-CM | POA: Diagnosis not present

## 2020-04-27 DIAGNOSIS — E1169 Type 2 diabetes mellitus with other specified complication: Secondary | ICD-10-CM | POA: Diagnosis not present

## 2020-04-27 DIAGNOSIS — Z0000 Encounter for general adult medical examination without abnormal findings: Secondary | ICD-10-CM | POA: Diagnosis not present

## 2020-04-27 DIAGNOSIS — I10 Essential (primary) hypertension: Secondary | ICD-10-CM | POA: Diagnosis not present

## 2020-04-27 DIAGNOSIS — E785 Hyperlipidemia, unspecified: Secondary | ICD-10-CM | POA: Diagnosis not present

## 2020-05-07 DIAGNOSIS — M160 Bilateral primary osteoarthritis of hip: Secondary | ICD-10-CM | POA: Diagnosis not present

## 2020-05-07 DIAGNOSIS — E1151 Type 2 diabetes mellitus with diabetic peripheral angiopathy without gangrene: Secondary | ICD-10-CM | POA: Diagnosis not present

## 2020-05-07 DIAGNOSIS — I083 Combined rheumatic disorders of mitral, aortic and tricuspid valves: Secondary | ICD-10-CM | POA: Diagnosis not present

## 2020-05-07 DIAGNOSIS — N189 Chronic kidney disease, unspecified: Secondary | ICD-10-CM | POA: Diagnosis not present

## 2020-05-07 DIAGNOSIS — E1122 Type 2 diabetes mellitus with diabetic chronic kidney disease: Secondary | ICD-10-CM | POA: Diagnosis not present

## 2020-05-07 DIAGNOSIS — M25552 Pain in left hip: Secondary | ICD-10-CM | POA: Diagnosis not present

## 2020-05-07 DIAGNOSIS — I872 Venous insufficiency (chronic) (peripheral): Secondary | ICD-10-CM | POA: Diagnosis not present

## 2020-05-07 DIAGNOSIS — M48061 Spinal stenosis, lumbar region without neurogenic claudication: Secondary | ICD-10-CM | POA: Diagnosis not present

## 2020-05-07 DIAGNOSIS — I5032 Chronic diastolic (congestive) heart failure: Secondary | ICD-10-CM | POA: Diagnosis not present

## 2020-05-07 DIAGNOSIS — I0981 Rheumatic heart failure: Secondary | ICD-10-CM | POA: Diagnosis not present

## 2020-05-07 DIAGNOSIS — I130 Hypertensive heart and chronic kidney disease with heart failure and stage 1 through stage 4 chronic kidney disease, or unspecified chronic kidney disease: Secondary | ICD-10-CM | POA: Diagnosis not present

## 2020-05-07 DIAGNOSIS — I2694 Multiple subsegmental pulmonary emboli without acute cor pulmonale: Secondary | ICD-10-CM | POA: Diagnosis not present

## 2020-05-07 DIAGNOSIS — S72402A Unspecified fracture of lower end of left femur, initial encounter for closed fracture: Secondary | ICD-10-CM | POA: Diagnosis not present

## 2020-05-07 DIAGNOSIS — M1712 Unilateral primary osteoarthritis, left knee: Secondary | ICD-10-CM | POA: Diagnosis not present

## 2020-05-07 DIAGNOSIS — I255 Ischemic cardiomyopathy: Secondary | ICD-10-CM | POA: Diagnosis not present

## 2020-05-07 DIAGNOSIS — M47812 Spondylosis without myelopathy or radiculopathy, cervical region: Secondary | ICD-10-CM | POA: Diagnosis not present

## 2020-05-07 DIAGNOSIS — M5021 Other cervical disc displacement,  high cervical region: Secondary | ICD-10-CM | POA: Diagnosis not present

## 2020-05-07 DIAGNOSIS — I4891 Unspecified atrial fibrillation: Secondary | ICD-10-CM | POA: Diagnosis not present

## 2020-05-13 DIAGNOSIS — I10 Essential (primary) hypertension: Secondary | ICD-10-CM | POA: Diagnosis not present

## 2020-05-13 DIAGNOSIS — R82998 Other abnormal findings in urine: Secondary | ICD-10-CM | POA: Diagnosis not present

## 2020-06-01 ENCOUNTER — Ambulatory Visit
Admission: RE | Admit: 2020-06-01 | Discharge: 2020-06-01 | Disposition: A | Discharge status: Home / Self Care | Payer: Medicare Other | Source: Ambulatory Visit | Attending: Orthopedic Surgery | Admitting: Orthopedic Surgery

## 2020-06-01 ENCOUNTER — Other Ambulatory Visit: Payer: Self-pay

## 2020-06-01 DIAGNOSIS — Z1231 Encounter for screening mammogram for malignant neoplasm of breast: Secondary | ICD-10-CM

## 2020-06-01 DIAGNOSIS — E2839 Other primary ovarian failure: Secondary | ICD-10-CM

## 2020-06-01 DIAGNOSIS — Z723 Lack of physical exercise: Secondary | ICD-10-CM | POA: Diagnosis not present

## 2020-06-01 DIAGNOSIS — M1712 Unilateral primary osteoarthritis, left knee: Secondary | ICD-10-CM | POA: Diagnosis not present

## 2020-06-10 DIAGNOSIS — I0981 Rheumatic heart failure: Secondary | ICD-10-CM | POA: Diagnosis not present

## 2020-06-10 DIAGNOSIS — M1712 Unilateral primary osteoarthritis, left knee: Secondary | ICD-10-CM | POA: Diagnosis not present

## 2020-06-10 DIAGNOSIS — I130 Hypertensive heart and chronic kidney disease with heart failure and stage 1 through stage 4 chronic kidney disease, or unspecified chronic kidney disease: Secondary | ICD-10-CM | POA: Diagnosis not present

## 2020-06-10 DIAGNOSIS — I5032 Chronic diastolic (congestive) heart failure: Secondary | ICD-10-CM | POA: Diagnosis not present

## 2020-06-10 DIAGNOSIS — I872 Venous insufficiency (chronic) (peripheral): Secondary | ICD-10-CM | POA: Diagnosis not present

## 2020-06-10 DIAGNOSIS — M160 Bilateral primary osteoarthritis of hip: Secondary | ICD-10-CM | POA: Diagnosis not present

## 2020-06-10 DIAGNOSIS — N189 Chronic kidney disease, unspecified: Secondary | ICD-10-CM | POA: Diagnosis not present

## 2020-06-10 DIAGNOSIS — M47812 Spondylosis without myelopathy or radiculopathy, cervical region: Secondary | ICD-10-CM | POA: Diagnosis not present

## 2020-06-10 DIAGNOSIS — M48061 Spinal stenosis, lumbar region without neurogenic claudication: Secondary | ICD-10-CM | POA: Diagnosis not present

## 2020-06-10 DIAGNOSIS — E1122 Type 2 diabetes mellitus with diabetic chronic kidney disease: Secondary | ICD-10-CM | POA: Diagnosis not present

## 2020-06-10 DIAGNOSIS — I083 Combined rheumatic disorders of mitral, aortic and tricuspid valves: Secondary | ICD-10-CM | POA: Diagnosis not present

## 2020-06-10 DIAGNOSIS — E1151 Type 2 diabetes mellitus with diabetic peripheral angiopathy without gangrene: Secondary | ICD-10-CM | POA: Diagnosis not present

## 2020-06-10 DIAGNOSIS — I255 Ischemic cardiomyopathy: Secondary | ICD-10-CM | POA: Diagnosis not present

## 2020-06-10 DIAGNOSIS — I2694 Multiple subsegmental pulmonary emboli without acute cor pulmonale: Secondary | ICD-10-CM | POA: Diagnosis not present

## 2020-06-10 DIAGNOSIS — I4891 Unspecified atrial fibrillation: Secondary | ICD-10-CM | POA: Diagnosis not present

## 2020-06-10 DIAGNOSIS — M5021 Other cervical disc displacement,  high cervical region: Secondary | ICD-10-CM | POA: Diagnosis not present

## 2020-07-01 DIAGNOSIS — Z723 Lack of physical exercise: Secondary | ICD-10-CM | POA: Diagnosis not present

## 2020-07-01 DIAGNOSIS — M1712 Unilateral primary osteoarthritis, left knee: Secondary | ICD-10-CM | POA: Diagnosis not present

## 2020-07-13 DIAGNOSIS — S72452D Displaced supracondylar fracture without intracondylar extension of lower end of left femur, subsequent encounter for closed fracture with routine healing: Secondary | ICD-10-CM | POA: Diagnosis not present

## 2020-07-13 DIAGNOSIS — M1712 Unilateral primary osteoarthritis, left knee: Secondary | ICD-10-CM | POA: Diagnosis not present

## 2020-08-01 DIAGNOSIS — M1712 Unilateral primary osteoarthritis, left knee: Secondary | ICD-10-CM | POA: Diagnosis not present

## 2020-08-01 DIAGNOSIS — Z723 Lack of physical exercise: Secondary | ICD-10-CM | POA: Diagnosis not present

## 2020-08-19 DIAGNOSIS — E1169 Type 2 diabetes mellitus with other specified complication: Secondary | ICD-10-CM | POA: Diagnosis not present

## 2020-08-19 DIAGNOSIS — R413 Other amnesia: Secondary | ICD-10-CM | POA: Diagnosis not present

## 2020-08-19 DIAGNOSIS — Z7901 Long term (current) use of anticoagulants: Secondary | ICD-10-CM | POA: Diagnosis not present

## 2020-08-19 DIAGNOSIS — I10 Essential (primary) hypertension: Secondary | ICD-10-CM | POA: Diagnosis not present

## 2020-08-19 DIAGNOSIS — M79604 Pain in right leg: Secondary | ICD-10-CM | POA: Diagnosis not present

## 2020-08-20 ENCOUNTER — Other Ambulatory Visit: Payer: Self-pay | Admitting: Internal Medicine

## 2020-08-20 DIAGNOSIS — R413 Other amnesia: Secondary | ICD-10-CM

## 2020-09-01 DIAGNOSIS — Z723 Lack of physical exercise: Secondary | ICD-10-CM | POA: Diagnosis not present

## 2020-09-01 DIAGNOSIS — M1712 Unilateral primary osteoarthritis, left knee: Secondary | ICD-10-CM | POA: Diagnosis not present

## 2020-09-02 ENCOUNTER — Other Ambulatory Visit: Payer: Self-pay

## 2020-09-02 ENCOUNTER — Encounter (HOSPITAL_COMMUNITY): Payer: Self-pay

## 2020-09-02 ENCOUNTER — Emergency Department (HOSPITAL_COMMUNITY): Payer: Medicare Other

## 2020-09-02 ENCOUNTER — Inpatient Hospital Stay (HOSPITAL_COMMUNITY)
Admission: EM | Admit: 2020-09-02 | Discharge: 2020-09-11 | DRG: 956 | Disposition: A | Discharge status: Skilled Nursing Facility | Payer: Medicare Other | Attending: Internal Medicine | Admitting: Internal Medicine

## 2020-09-02 DIAGNOSIS — S72142A Displaced intertrochanteric fracture of left femur, initial encounter for closed fracture: Secondary | ICD-10-CM | POA: Diagnosis present

## 2020-09-02 DIAGNOSIS — I110 Hypertensive heart disease with heart failure: Secondary | ICD-10-CM | POA: Diagnosis present

## 2020-09-02 DIAGNOSIS — K449 Diaphragmatic hernia without obstruction or gangrene: Secondary | ICD-10-CM | POA: Diagnosis not present

## 2020-09-02 DIAGNOSIS — I4891 Unspecified atrial fibrillation: Secondary | ICD-10-CM | POA: Diagnosis not present

## 2020-09-02 DIAGNOSIS — Y92012 Bathroom of single-family (private) house as the place of occurrence of the external cause: Secondary | ICD-10-CM

## 2020-09-02 DIAGNOSIS — I252 Old myocardial infarction: Secondary | ICD-10-CM

## 2020-09-02 DIAGNOSIS — I351 Nonrheumatic aortic (valve) insufficiency: Secondary | ICD-10-CM | POA: Diagnosis not present

## 2020-09-02 DIAGNOSIS — I739 Peripheral vascular disease, unspecified: Secondary | ICD-10-CM | POA: Diagnosis not present

## 2020-09-02 DIAGNOSIS — E785 Hyperlipidemia, unspecified: Secondary | ICD-10-CM | POA: Diagnosis present

## 2020-09-02 DIAGNOSIS — E7800 Pure hypercholesterolemia, unspecified: Secondary | ICD-10-CM | POA: Diagnosis not present

## 2020-09-02 DIAGNOSIS — M4800 Spinal stenosis, site unspecified: Secondary | ICD-10-CM | POA: Diagnosis not present

## 2020-09-02 DIAGNOSIS — I5022 Chronic systolic (congestive) heart failure: Secondary | ICD-10-CM | POA: Diagnosis present

## 2020-09-02 DIAGNOSIS — M810 Age-related osteoporosis without current pathological fracture: Secondary | ICD-10-CM | POA: Diagnosis not present

## 2020-09-02 DIAGNOSIS — M25562 Pain in left knee: Secondary | ICD-10-CM | POA: Diagnosis not present

## 2020-09-02 DIAGNOSIS — S72402A Unspecified fracture of lower end of left femur, initial encounter for closed fracture: Secondary | ICD-10-CM | POA: Diagnosis not present

## 2020-09-02 DIAGNOSIS — S72002A Fracture of unspecified part of neck of left femur, initial encounter for closed fracture: Secondary | ICD-10-CM | POA: Diagnosis not present

## 2020-09-02 DIAGNOSIS — Z86711 Personal history of pulmonary embolism: Secondary | ICD-10-CM | POA: Diagnosis not present

## 2020-09-02 DIAGNOSIS — Z9889 Other specified postprocedural states: Secondary | ICD-10-CM | POA: Diagnosis not present

## 2020-09-02 DIAGNOSIS — W010XXA Fall on same level from slipping, tripping and stumbling without subsequent striking against object, initial encounter: Secondary | ICD-10-CM | POA: Diagnosis present

## 2020-09-02 DIAGNOSIS — R918 Other nonspecific abnormal finding of lung field: Secondary | ICD-10-CM | POA: Diagnosis not present

## 2020-09-02 DIAGNOSIS — R2681 Unsteadiness on feet: Secondary | ICD-10-CM | POA: Diagnosis not present

## 2020-09-02 DIAGNOSIS — M2550 Pain in unspecified joint: Secondary | ICD-10-CM | POA: Diagnosis not present

## 2020-09-02 DIAGNOSIS — M179 Osteoarthritis of knee, unspecified: Secondary | ICD-10-CM | POA: Diagnosis present

## 2020-09-02 DIAGNOSIS — Z885 Allergy status to narcotic agent status: Secondary | ICD-10-CM

## 2020-09-02 DIAGNOSIS — I471 Supraventricular tachycardia: Secondary | ICD-10-CM | POA: Diagnosis not present

## 2020-09-02 DIAGNOSIS — K5900 Constipation, unspecified: Secondary | ICD-10-CM | POA: Diagnosis not present

## 2020-09-02 DIAGNOSIS — M6281 Muscle weakness (generalized): Secondary | ICD-10-CM | POA: Diagnosis not present

## 2020-09-02 DIAGNOSIS — I69828 Other speech and language deficits following other cerebrovascular disease: Secondary | ICD-10-CM | POA: Diagnosis not present

## 2020-09-02 DIAGNOSIS — S72102A Unspecified trochanteric fracture of left femur, initial encounter for closed fracture: Secondary | ICD-10-CM | POA: Diagnosis not present

## 2020-09-02 DIAGNOSIS — I214 Non-ST elevation (NSTEMI) myocardial infarction: Secondary | ICD-10-CM | POA: Diagnosis not present

## 2020-09-02 DIAGNOSIS — M1710 Unilateral primary osteoarthritis, unspecified knee: Secondary | ICD-10-CM | POA: Diagnosis not present

## 2020-09-02 DIAGNOSIS — J690 Pneumonitis due to inhalation of food and vomit: Secondary | ICD-10-CM | POA: Diagnosis not present

## 2020-09-02 DIAGNOSIS — R338 Other retention of urine: Secondary | ICD-10-CM | POA: Diagnosis not present

## 2020-09-02 DIAGNOSIS — M898X9 Other specified disorders of bone, unspecified site: Secondary | ICD-10-CM | POA: Diagnosis present

## 2020-09-02 DIAGNOSIS — T8484XA Pain due to internal orthopedic prosthetic devices, implants and grafts, initial encounter: Secondary | ICD-10-CM | POA: Diagnosis not present

## 2020-09-02 DIAGNOSIS — Z8781 Personal history of (healed) traumatic fracture: Secondary | ICD-10-CM

## 2020-09-02 DIAGNOSIS — W19XXXA Unspecified fall, initial encounter: Secondary | ICD-10-CM | POA: Diagnosis not present

## 2020-09-02 DIAGNOSIS — I10 Essential (primary) hypertension: Secondary | ICD-10-CM | POA: Diagnosis present

## 2020-09-02 DIAGNOSIS — I8390 Asymptomatic varicose veins of unspecified lower extremity: Secondary | ICD-10-CM | POA: Diagnosis present

## 2020-09-02 DIAGNOSIS — E1169 Type 2 diabetes mellitus with other specified complication: Secondary | ICD-10-CM | POA: Diagnosis not present

## 2020-09-02 DIAGNOSIS — R0689 Other abnormalities of breathing: Secondary | ICD-10-CM | POA: Diagnosis not present

## 2020-09-02 DIAGNOSIS — Z86718 Personal history of other venous thrombosis and embolism: Secondary | ICD-10-CM

## 2020-09-02 DIAGNOSIS — Z923 Personal history of irradiation: Secondary | ICD-10-CM

## 2020-09-02 DIAGNOSIS — I350 Nonrheumatic aortic (valve) stenosis: Secondary | ICD-10-CM | POA: Diagnosis not present

## 2020-09-02 DIAGNOSIS — R41841 Cognitive communication deficit: Secondary | ICD-10-CM | POA: Diagnosis not present

## 2020-09-02 DIAGNOSIS — M549 Dorsalgia, unspecified: Secondary | ICD-10-CM | POA: Diagnosis not present

## 2020-09-02 DIAGNOSIS — I2510 Atherosclerotic heart disease of native coronary artery without angina pectoris: Secondary | ICD-10-CM | POA: Diagnosis not present

## 2020-09-02 DIAGNOSIS — I459 Conduction disorder, unspecified: Secondary | ICD-10-CM | POA: Diagnosis not present

## 2020-09-02 DIAGNOSIS — R109 Unspecified abdominal pain: Secondary | ICD-10-CM

## 2020-09-02 DIAGNOSIS — M25462 Effusion, left knee: Secondary | ICD-10-CM | POA: Diagnosis not present

## 2020-09-02 DIAGNOSIS — I4581 Long QT syndrome: Secondary | ICD-10-CM | POA: Diagnosis not present

## 2020-09-02 DIAGNOSIS — R9431 Abnormal electrocardiogram [ECG] [EKG]: Secondary | ICD-10-CM | POA: Diagnosis present

## 2020-09-02 DIAGNOSIS — D62 Acute posthemorrhagic anemia: Secondary | ICD-10-CM | POA: Diagnosis not present

## 2020-09-02 DIAGNOSIS — M545 Low back pain, unspecified: Secondary | ICD-10-CM | POA: Diagnosis not present

## 2020-09-02 DIAGNOSIS — Z7401 Bed confinement status: Secondary | ICD-10-CM | POA: Diagnosis not present

## 2020-09-02 DIAGNOSIS — Z79899 Other long term (current) drug therapy: Secondary | ICD-10-CM

## 2020-09-02 DIAGNOSIS — M7989 Other specified soft tissue disorders: Secondary | ICD-10-CM | POA: Diagnosis not present

## 2020-09-02 DIAGNOSIS — I472 Ventricular tachycardia: Secondary | ICD-10-CM | POA: Diagnosis not present

## 2020-09-02 DIAGNOSIS — R140 Abdominal distension (gaseous): Secondary | ICD-10-CM | POA: Diagnosis not present

## 2020-09-02 DIAGNOSIS — Z472 Encounter for removal of internal fixation device: Secondary | ICD-10-CM | POA: Diagnosis not present

## 2020-09-02 DIAGNOSIS — Z96651 Presence of right artificial knee joint: Secondary | ICD-10-CM | POA: Diagnosis present

## 2020-09-02 DIAGNOSIS — T1490XA Injury, unspecified, initial encounter: Secondary | ICD-10-CM

## 2020-09-02 DIAGNOSIS — I517 Cardiomegaly: Secondary | ICD-10-CM | POA: Diagnosis not present

## 2020-09-02 DIAGNOSIS — S72009A Fracture of unspecified part of neck of unspecified femur, initial encounter for closed fracture: Secondary | ICD-10-CM

## 2020-09-02 DIAGNOSIS — Z85820 Personal history of malignant melanoma of skin: Secondary | ICD-10-CM

## 2020-09-02 DIAGNOSIS — S27321A Contusion of lung, unilateral, initial encounter: Secondary | ICD-10-CM | POA: Diagnosis present

## 2020-09-02 DIAGNOSIS — S79912A Unspecified injury of left hip, initial encounter: Secondary | ICD-10-CM | POA: Diagnosis not present

## 2020-09-02 DIAGNOSIS — I130 Hypertensive heart and chronic kidney disease with heart failure and stage 1 through stage 4 chronic kidney disease, or unspecified chronic kidney disease: Secondary | ICD-10-CM | POA: Diagnosis not present

## 2020-09-02 DIAGNOSIS — Z7901 Long term (current) use of anticoagulants: Secondary | ICD-10-CM

## 2020-09-02 DIAGNOSIS — R0602 Shortness of breath: Secondary | ICD-10-CM

## 2020-09-02 DIAGNOSIS — R1314 Dysphagia, pharyngoesophageal phase: Secondary | ICD-10-CM | POA: Diagnosis not present

## 2020-09-02 DIAGNOSIS — Z853 Personal history of malignant neoplasm of breast: Secondary | ICD-10-CM

## 2020-09-02 DIAGNOSIS — Z841 Family history of disorders of kidney and ureter: Secondary | ICD-10-CM

## 2020-09-02 DIAGNOSIS — Z9181 History of falling: Secondary | ICD-10-CM | POA: Diagnosis not present

## 2020-09-02 DIAGNOSIS — I248 Other forms of acute ischemic heart disease: Secondary | ICD-10-CM | POA: Diagnosis present

## 2020-09-02 DIAGNOSIS — R2689 Other abnormalities of gait and mobility: Secondary | ICD-10-CM | POA: Diagnosis not present

## 2020-09-02 DIAGNOSIS — S72042A Displaced fracture of base of neck of left femur, initial encounter for closed fracture: Secondary | ICD-10-CM | POA: Diagnosis not present

## 2020-09-02 DIAGNOSIS — I5030 Unspecified diastolic (congestive) heart failure: Secondary | ICD-10-CM | POA: Diagnosis not present

## 2020-09-02 DIAGNOSIS — I959 Hypotension, unspecified: Secondary | ICD-10-CM | POA: Diagnosis not present

## 2020-09-02 DIAGNOSIS — Z20822 Contact with and (suspected) exposure to covid-19: Secondary | ICD-10-CM | POA: Diagnosis not present

## 2020-09-02 DIAGNOSIS — S79929A Unspecified injury of unspecified thigh, initial encounter: Secondary | ICD-10-CM | POA: Diagnosis not present

## 2020-09-02 DIAGNOSIS — S72142D Displaced intertrochanteric fracture of left femur, subsequent encounter for closed fracture with routine healing: Secondary | ICD-10-CM | POA: Diagnosis not present

## 2020-09-02 DIAGNOSIS — Z91013 Allergy to seafood: Secondary | ICD-10-CM

## 2020-09-02 LAB — CBC WITH DIFFERENTIAL/PLATELET
Abs Immature Granulocytes: 0.05 10*3/uL (ref 0.00–0.07)
Basophils Absolute: 0 10*3/uL (ref 0.0–0.1)
Basophils Relative: 0 %
Eosinophils Absolute: 0 10*3/uL (ref 0.0–0.5)
Eosinophils Relative: 0 %
HCT: 37.1 % (ref 36.0–46.0)
Hemoglobin: 11.8 g/dL — ABNORMAL LOW (ref 12.0–15.0)
Immature Granulocytes: 1 %
Lymphocytes Relative: 9 %
Lymphs Abs: 0.9 10*3/uL (ref 0.7–4.0)
MCH: 30.3 pg (ref 26.0–34.0)
MCHC: 31.8 g/dL (ref 30.0–36.0)
MCV: 95.4 fL (ref 80.0–100.0)
Monocytes Absolute: 0.7 10*3/uL (ref 0.1–1.0)
Monocytes Relative: 6 %
Neutro Abs: 8.7 10*3/uL — ABNORMAL HIGH (ref 1.7–7.7)
Neutrophils Relative %: 84 %
Platelets: 151 10*3/uL (ref 150–400)
RBC: 3.89 MIL/uL (ref 3.87–5.11)
RDW: 13.8 % (ref 11.5–15.5)
WBC: 10.3 10*3/uL (ref 4.0–10.5)
nRBC: 0 % (ref 0.0–0.2)

## 2020-09-02 LAB — BASIC METABOLIC PANEL
Anion gap: 13 (ref 5–15)
BUN: 25 mg/dL — ABNORMAL HIGH (ref 8–23)
CO2: 24 mmol/L (ref 22–32)
Calcium: 9.3 mg/dL (ref 8.9–10.3)
Chloride: 101 mmol/L (ref 98–111)
Creatinine, Ser: 0.67 mg/dL (ref 0.44–1.00)
GFR, Estimated: >60 mL/min (ref >60)
Glucose, Bld: 108 mg/dL — ABNORMAL HIGH (ref 70–99)
Potassium: 3.5 mmol/L (ref 3.5–5.1)
Sodium: 138 mmol/L (ref 135–145)

## 2020-09-02 LAB — TYPE AND SCREEN
ABO/RH(D): A POS
Antibody Screen: NEGATIVE

## 2020-09-02 LAB — RESP PANEL BY RT-PCR (FLU A&B, COVID) ARPGX2
Influenza A by PCR: NEGATIVE
Influenza B by PCR: NEGATIVE
SARS Coronavirus 2 by RT PCR: NEGATIVE

## 2020-09-02 LAB — PROTIME-INR
INR: 1.2 (ref 0.8–1.2)
Prothrombin Time: 14.8 seconds (ref 11.4–15.2)

## 2020-09-02 MED ORDER — DEXTROSE-NACL 5-0.45 % IV SOLN: INTRAVENOUS | Status: DC

## 2020-09-02 MED ORDER — FENTANYL CITRATE (PF) 100 MCG/2ML IJ SOLN
12.5000 ug | INTRAMUSCULAR | Status: DC | PRN
  Administered 2020-09-02 – 2020-09-03 (×2): 50 ug via INTRAVENOUS
  Filled 2020-09-02 (×2): qty 2

## 2020-09-02 MED ORDER — CEFAZOLIN SODIUM-DEXTROSE 2-4 GM/100ML-% IV SOLN
2.0000 g | INTRAVENOUS | Status: AC
  Administered 2020-09-03: 2 g via INTRAVENOUS
  Filled 2020-09-02 (×2): qty 100

## 2020-09-02 MED ORDER — CHLORHEXIDINE GLUCONATE 4 % EX LIQD
60.0000 mL | Freq: Once | CUTANEOUS | Status: DC
  Filled 2020-09-02: qty 60

## 2020-09-02 MED ORDER — FENTANYL CITRATE (PF) 100 MCG/2ML IJ SOLN
50.0000 ug | Freq: Once | INTRAMUSCULAR | Status: AC
Start: 2020-09-02 — End: 2020-09-02
  Administered 2020-09-02: 50 ug via INTRAVENOUS
  Filled 2020-09-02: qty 2

## 2020-09-02 NOTE — ED Notes (Signed)
O2 sat decreased to 90% on RA. Applied O2 at 2lpm via [place].

## 2020-09-02 NOTE — ED Triage Notes (Signed)
Patient arrives from home with [facility], patient reports she was in her bathroom and turned to shut off the light, she turned back and "fell" (patient states she sat down). Patient complaining of L hip pain. Given 200 mcg Fentanyl en route with EMS.

## 2020-09-02 NOTE — Progress Notes (Signed)
Consult request received for left intertrochanteric femur fracture above plate. Have discussed with the requesting MD patient's stability, pain control, and ongoing work up. I have reviewed x-rays and developed a provisional plan for surgical repair at 0800.   Full consultation to follow in the am. Appreciate Medicine Service.  Altamese [place]aska, MD [facility], [facility] [phone]

## 2020-09-02 NOTE — ED Provider Notes (Signed)
[facility] EMERGENCY DEPARTMENT Provider Note   CSN: [id number] Arrival date & time: 09/02/20  2005     History Chief Complaint  Patient presents with  . Hip Pain  . Fall    [name] is a 87 y.o. female.  The history is provided by the patient and medical records. No language interpreter was used.  Fall This is a recurrent problem. The current episode started 1 to 2 hours ago. The problem occurs rarely. The problem has not changed since onset.Pertinent negatives include no chest pain, no abdominal pain, no headaches and no shortness of breath. The symptoms are aggravated by twisting and bending. Nothing relieves the symptoms. She has tried nothing for the symptoms. The treatment provided no relief.       Past Medical History:  Diagnosis Date  . Arthritis    "in my back"  . Breast cancer ([place]) 1996   left breast cancer   . Complication of anesthesia 04/18/2012   "didn't tolerate it today very well; had the shakes and very hard time w/it"  . Depression 11/20/2012  . DVT (deep venous thrombosis) ([place])   . High cholesterol   . History of alcoholism ([place])    7 1/2 years clean  . Hypertension   . Melanoma ([place]) 01/15/2004   upper right arm  . Osteoporosis   . Peripheral vascular disease (H[facility])    hx of ligation   . Personal history of radiation therapy 1993   Left Breast Cancer  . SCC (squamous cell carcinoma) 01/15/2004   left post upper arm, Right elbow, post left knee  . SCC (squamous cell carcinoma) 05/21/2013   right forearm, right jawline, left cheek  . SCC (squamous cell carcinoma) 08/24/2016   right shin,right thigh  . SCC (squamous cell carcinoma) 11/01/2016  . SCC (squamous cell carcinoma) 02/02/2018   left outer arm,right outer thigh  . SCC (squamous cell carcinoma) 12/13/2018   right jawline  . Spinal stenosis     Patient Active Problem List   Diagnosis Date Noted  . Non-ST elevation (NSTEMI) myocardial infarction ([place])    . Closed fracture of distal end of left femur, initial encounter ([place]) 11/01/2019  . Other fracture of left femur, initial encounter for closed fracture ([place]) 11/01/2019  . Hip fracture ([place]) 11/01/2019  . Postprocedural hypotension   . NSVT (nonsustained ventricular tachycardia) ([place]) 09/26/2018  . Bradycardia   . Displaced supracondylar fracture of distal end of right femur without intracondylar extension ([place]) 05/04/2018  . Postop check   . Pain   . Femur fracture, right ([place]) 05/02/2018  . Prolonged QT interval 05/02/2018  . [place] 05/02/2018  . Left ventricular diastolic dysfunction with preserved systolic function 96/78/9381  . PAT (paroxysmal atrial tachycardia) ([place]) 03/24/2016  . Diastolic dysfunction without heart failure 03/24/2016  . Peripheral venous insufficiency 03/24/2016  . Closed wedge compression fracture of fourth lumbar vertebra ([place])   . Near syncope 10/20/2014  . Orthostatic hypotension 10/20/2014  . Fatigue 10/19/2014  . Syncope 10/19/2014  . Generalized weakness   . Elevated troponin   . Dyspnea on exertion   . Rib fracture 09/05/2014  . HX: breast cancer 09/26/2013  . Depression 11/20/2012  . Unspecified constipation 11/20/2012  . Pure hypercholesterolemia 11/20/2012  . Essential hypertension, benign 11/20/2012  . Postoperative anemia due to acute blood loss 11/01/2012  . Hyponatremia 11/01/2012  . Hypokalemia 11/01/2012  . OA (osteoarthritis) of knee 10/29/2012  . Septic joint of right knee joint ([place])  05/23/2012  . Elevated liver enzymes 05/23/2012    Past Surgical History:  Procedure Laterality Date  . BREAST LUMPECTOMY Left 1996  . I & D EXTREMITY  04/17/2012   Procedure: IRRIGATION AND DEBRIDEMENT EXTREMITY;  Surgeon: Rudean Haskell, MD;  Location: [facility];  Service: Orthopedics;  Laterality: Right;  . KNEE ARTHROSCOPY  04/17/2012   Procedure: ARTHROSCOPY KNEE;  Surgeon: Rudean Haskell, MD;  Location: [facility];  Service: Orthopedics;   Laterality: Right;  . KYPHOPLASTY  2009, 2013   thorasic, lumbar  . MENISECTOMY Right 04/11/2012  . ORIF FEMUR FRACTURE Left 11/01/2019   Procedure: OPEN REDUCTION INTERNAL FIXATION (ORIF) DISTAL FEMUR FRACTURE;  Surgeon: Altamese Moulton, MD;  Location: [facility];  Service: Orthopedics;  Laterality: Left;  . ORIF PERIPROSTHETIC FRACTURE Right 05/03/2018   Procedure: RIGHT RETROGRADE FEMORAL NAIL;  Surgeon: Rod Can, MD;  Location: [facility];  Service: Orthopedics;  Laterality: Right;  . POSTERIOR LAMINECTOMY / DECOMPRESSION LUMBAR SPINE  1980's  . rotator cuff surgery  Right   . TONSILLECTOMY AND ADENOIDECTOMY     "I was a child"  . TOTAL KNEE ARTHROPLASTY Right 10/29/2012   Procedure: RIGHT TOTAL KNEE ARTHROPLASTY;  Surgeon: Gearlean Alf, MD;  Location: [facility];  Service: Orthopedics;  Laterality: Right;  . VEIN LIGATION       OB History   No obstetric history on file.     Family History  Problem Relation Age of Onset  . Kidney failure Mother   . Anuerysm Father        AAA    Social History   Tobacco Use  . Smoking status: Never Smoker  . Smokeless tobacco: Never Used  Vaping Use  . Vaping Use: Never used  Substance Use Topics  . Alcohol use: No  . Drug use: No    Home Medications Prior to Admission medications   Medication Sig Start Date End Date Taking? Authorizing Provider  acetaminophen (TYLENOL) 500 MG tablet Take 1,000 mg by mouth daily as needed (back pain).    [provider]  Amino Acids-Protein Hydrolys (FEEDING SUPPLEMENT, PRO-STAT SUGAR FREE 64,) LIQD Take 30 mLs by mouth 2 (two) times daily. 11/08/19   Alma Friendly, MD  apixaban (ELIQUIS) 5 MG TABS tablet Take 1 tablet (5 mg total) by mouth 2 (two) times daily. 11/13/19   Alma Friendly, MD  Calcium Carbonate-Vitamin D (CALCIUM-D) 600-400 MG-UNIT TABS Take 1 tablet by mouth daily.    [provider]  furosemide (LASIX) 40 MG tablet Take 1 tablet (40 mg total) by mouth 3 (three)  times a week. 04/13/20 07/12/20  Croitoru, Mihai, MD  losartan (COZAAR) 25 MG tablet Take 0.5 tablets (12.5 mg total) by mouth daily. 11/09/19   Alma Friendly, MD  Multiple Vitamins-Minerals (MULTIVITAMIN ADULT) TABS Take 1 tablet by mouth daily.  08/17/09   [provider]  polyethylene glycol (MIRALAX / GLYCOLAX) 17 g packet Take 17 g by mouth daily as needed. 11/08/19   Alma Friendly, MD  rosuvastatin (CRESTOR) 20 MG tablet Take 1 tablet (20 mg total) by mouth daily at 6 PM. 11/08/19   Alma Friendly, MD  senna-docusate (SENOKOT-S) 8.6-50 MG tablet Take 1 tablet by mouth at bedtime. 11/08/19   Alma Friendly, MD    Allergies    Oysters [shellfish allergy] and Codeine  Review of Systems   Review of Systems  Constitutional: Negative for chills, diaphoresis, fatigue and fever.  HENT: Negative for congestion.  Eyes: Negative for visual disturbance.  Respiratory: Negative for cough, chest tightness, shortness of breath and wheezing.   Cardiovascular: Negative for chest pain, palpitations and leg swelling.  Gastrointestinal: Negative for abdominal pain, constipation, diarrhea, nausea and vomiting.  Genitourinary: Negative for dysuria, flank pain and frequency.  Musculoskeletal: Negative for back pain, neck pain and neck stiffness.  Skin: Negative for wound.  Neurological: Negative for weakness, light-headedness, numbness and headaches.  Psychiatric/Behavioral: Negative for agitation and confusion.  All other systems reviewed and are negative.   Physical Exam Updated Vital Signs BP (!) 143/67 (BP Location: Left Arm)   Pulse 85   Temp 98.1 F (36.7 C) (Oral)   Resp 16   Ht 5\' 6"  (1.676 m)   Wt 62.3 kg   SpO2 (!) 89%   BMI 22.17 kg/m   Physical Exam Vitals and nursing note reviewed.  Constitutional:      General: She is not in acute distress.    Appearance: She is well-developed and well-nourished. She is not ill-appearing, toxic-appearing or  diaphoretic.  HENT:     Head: Normocephalic and atraumatic.     Mouth/Throat:     Mouth: Mucous membranes are moist.     Pharynx: No oropharyngeal exudate or posterior oropharyngeal erythema.  Eyes:     Conjunctiva/sclera: Conjunctivae normal.  Cardiovascular:     Rate and Rhythm: Normal rate and regular rhythm.     Heart sounds: No murmur heard.   Pulmonary:     Effort: Pulmonary effort is normal. No respiratory distress.     Breath sounds: Normal breath sounds. No wheezing, rhonchi or rales.  Chest:     Chest wall: No tenderness.  Abdominal:     General: Abdomen is flat.     Palpations: Abdomen is soft.     Tenderness: There is no abdominal tenderness.  Musculoskeletal:        General: Tenderness and deformity present. No edema.     Cervical back: Neck supple. No tenderness.       Legs:     Comments: L left shortened Intact pulses, strength and sensation   Skin:    General: Skin is warm and dry.     Capillary Refill: Capillary refill takes less than 2 seconds.     Findings: No erythema.  Neurological:     General: No focal deficit present.     Mental Status: She is alert.     Sensory: No sensory deficit.     Motor: No weakness.  Psychiatric:        Mood and Affect: Mood and affect and mood normal.     ED Results / Procedures / Treatments   Labs (all labs ordered are listed, but only abnormal results are displayed) Labs Reviewed  BASIC METABOLIC PANEL - Abnormal; Notable for the following components:      Result Value   Glucose, Bld 108 (*)    BUN 25 (*)    All other components within normal limits  CBC WITH DIFFERENTIAL/PLATELET - Abnormal; Notable for the following components:   Hemoglobin 11.8 (*)    Neutro Abs 8.7 (*)    All other components within normal limits  RESP PANEL BY RT-PCR (FLU A&B, COVID) [id number]  PROTIME-INR  COMPREHENSIVE METABOLIC PANEL  CBC  TYPE AND SCREEN    EKG EKG Interpretation  Date/Time:  Wednesday September 02 2020 23:19:36  EST Ventricular Rate:  68 PR Interval:    QRS Duration: 153 QT Interval:  454 QTC Calculation: 483 R  Axis:   174 Text Interpretation: Sinus rhythm Atrial premature complexes Prolonged PR interval Nonspecific intraventricular conduction delay When compared to prior, ST abnormalities improved. No STEMI Confirmed by Antony Blackbird [phone]) on 09/02/2020 11:34:47 PM   Radiology DG Chest 1 View  Result Date: 09/02/2020 CLINICAL DATA:  Pain status post fall EXAM: CHEST  1 VIEW COMPARISON:  10/31/2019 FINDINGS: There are multiple old healed left-sided rib fractures. There is no pneumothorax. No large pleural effusion. The heart size is enlarged. There are prominent interstitial lung markings bilaterally which are essentially unchanged from prior study. There is a probable hiatal hernia. IMPRESSION: Stable chest x-ray with cardiomegaly and chronic lung changes. No acute osseous abnormality is identified on this exam. Electronically Signed   By: Constance Holster M.D.   On: 09/02/2020 21:17   DG Pelvis 1-2 Views  Result Date: 09/02/2020 CLINICAL DATA:  87 year old female with fall and trauma to the left hip. EXAM: PELVIS - 1-2 VIEW COMPARISON:  Pelvic radiograph dated 05/02/2018. FINDINGS: There is a comminuted and displaced fracture of the left femoral neck with involvement of the intertrochanteric region. There is mild proximal migration of the femoral shaft in relation to the head. Advanced osteopenia. No dislocation. Partially visualized right femoral intramedullary nail. The soft tissues are unremarkable. IMPRESSION: Comminuted and displaced fracture of the left femoral neck. Electronically Signed   By: Anner Crete M.D.   On: 09/02/2020 21:14   DG Knee Complete 4 Views Left  Result Date: 09/02/2020 CLINICAL DATA:  Pain EXAM: LEFT KNEE - COMPLETE 4+ VIEW COMPARISON:  X-ray earlier in the same day. FINDINGS: There are advanced tricompartmental degenerative changes of the left knee, greatest within the  lateral compartment. There is a small joint effusion without definite evidence for an acute tibial plateau fracture. The visualized orthopedic hardware is intact. Vascular calcifications are noted. There is soft tissue swelling about the knee. IMPRESSION: Advanced tricompartmental degenerative changes of the left knee with a small joint effusion. No definite evidence for an acute osseous abnormality. Electronically Signed   By: Constance Holster M.D.   On: 09/02/2020 22:01   DG Femur Min 2 Views Left  Result Date: 09/02/2020 CLINICAL DATA:  Pain EXAM: LEFT FEMUR 2 VIEWS COMPARISON:  None. FINDINGS: There is an acute, comminuted and displaced intratrochanteric fracture of the proximal left femur. The patient has undergone prior plate screw fixation of the mid to distal femur. There is an old healed fracture of the distal left femur. The hardware is intact. There are end-stage degenerative changes of the left knee. There is an apparent age-indeterminate lateral tibial plateau fracture. There is soft tissue swelling about the knee. There is a small joint effusion. IMPRESSION: 1. Acute comminuted and displaced intratrochanteric fracture of the proximal left femur. 2. Age-indeterminate lateral tibial plateau fracture. Dedicated knee radiographs are recommended. 3. Old healed fracture of the distal left femur after plate screw fixation. 4. End-stage degenerative changes of the left knee. Electronically Signed   By: Constance Holster M.D.   On: 09/02/2020 21:15    Procedures Procedures   Medications Ordered in ED Medications  dextrose 5 %-0.45 % sodium chloride infusion ( Intravenous New Bag/Given 09/02/20 2332)  fentaNYL (SUBLIMAZE) injection 12.5-50 mcg (50 mcg Intravenous Given 09/02/20 2328)  fentaNYL (SUBLIMAZE) injection 50 mcg (50 mcg Intravenous Given 09/02/20 2132)    ED Course  I have reviewed the triage vital signs and the nursing notes.  Pertinent labs & imaging results that were available during  my care of  the patient were reviewed by me and considered in my medical decision making (see chart for details).    MDM Rules/Calculators/A&P                          Loriene Taunton is a 87 y.o. female with a past medical history significant for hypertension, hypercholesterolemia, osteoarthritis, and multiple leg fractures in the past who presents with a fall and left hip injury.  Patient reports that she was in the bathroom and turned around slipping falling hitting her left hip on the ground.  She reports immediate onset of pain and inability to ambulate.  She was brought in by EMS after getting some fentanyl.  She denies hitting her head.  She denies any neck pain, headache, loss of consciousness, chest pain, palpitations, shortness of breath, or back pain.  No upper extremity pains.  Only pain in her left proximal femur and hip area.  On exam, patient has a shortened left leg compared to right.  She has intact DP and PT pulse and has intact sensation and strength in the foot.  She has tenderness in her right mid femur and right hip area.  Minimal tenderness of the knee.  Pelvis appears to be stable on exam.  Abdomen nontender on my exam.  Patient had x-ray showing a left hip fracture with an intertrochanteric femoral fracture proximal to her previous hardware.  Her surgeon who operated on her last year for the left femoral injury is on tonight and I spoke with Dr. Marcelino Scot.  Dr. Ginette Pitman request she be admitted to the medicine service and be made n.p.o. at midnight for likely surgery tomorrow.  She will get screening admission labs and Covid test.  An x-ray of the knee was also ordered as the initial x-ray showed possible knee injury.  Spoke with medicine team who will admit.  While awaiting admission, EKG appeared somewhat abnormal compared to prior however on my reassessment multiple times, patient is denying any chest pain, palpitations, shortness of breath. Cards will take a look at the ECG  given the difference from prior.    10:54 PM I just spoke with cardiology felt the abnormal EKG.  They were able to look at some older EKGs I did not see and think it appears similar to when she had a hip fracture in the past.  They did report that her previous troponin during this episode was in the 3000 range.  They felt that repeating an EKG now would be helpful as well as getting an echo but they did not feel that a troponin would be as beneficial.  They report that they would be happy to help if they need to see her for preop medical clearance.  We will update the admitting medicine team about the EKG and request for echo and repeat EKG.  Patient still has no cardiac symptoms whatsoever.    Final Clinical Impression(s) / ED Diagnoses Final diagnoses:  Closed fracture of left hip, initial encounter ([facility])    Clinical Impression: 1. Closed fracture of left hip, initial encounter ([place])   2. Fall     Disposition: Admit  This note was prepared with assistance of Systems analyst. Occasional wrong-word or sound-a-like substitutions may have occurred due to the inherent limitations of voice recognition software.     Lezlie Davis, Gwenyth Allegra, MD 09/02/20 [phone]

## 2020-09-02 NOTE — H&P (Signed)
History and Physical   [name] XVQ:[id number] DOB: January 23, 1932 DOA: 09/02/2020  Referring MD/NP/PA: Dr. Sherry Ruffing  PCP: Burnard Bunting, MD   Outpatient Specialists: Dr. Marcelino Scot  Patient coming from: Home  Chief Complaint: Fall with left hip pain  HPI: [name] is a 87 y.o. female with medical history significant of breast cancer, previous left hip fracture, depression, DVT, melanoma and squamous cell cancer of the skin who came in with complaint of fall at home. She was apparently in her bathroom turned too short of the light when she fell on her left side. She came in by ambulance where she was evaluated in the ER. Patient found to have left femoral neck fracture. She was operated previously by Dr. Altamese [place] who plans to do another repair tomorrow. Patient being admitted to the medical service for evaluation. As part of her preop evaluation here patient was noted to have elevated troponin. Cardiology was curb sided by ER physician. Recommendation is that this is probably demand ischemia. Obtain echo in the morning for medical clearance. Patient also has had no head injuries. Did not hit her head when she fell. She denied any other pain in any other areas of fractures.   ED Course: . Temperature 98.3 blood pressure 124/57. Pulse is 85 respirate 16 oxygen sat initially 89% on room air. CBC showed hemoglobin 11.8 chemistry showed BUN 25 otherwise glucose 108 INR 1.2 and the rest of the numbers within normal. COVID-19 screen is negative. Chest x-ray showed stable findings, x-ray of the left hip shows acute comminuted and displaced intertrochanteric fracture of the proximal left femur. There is also age-indeterminate lateral tibial plateau fracture. Left knee x-ray showed advanced tricompartmental degenerative changes of the left knee with a small effusion. EKG showed nonspecific intraventricular conduction delay with marked ST changes in the lateral leads. Repeat x-ray shows  improvement.  Review of Systems: As per HPI otherwise 10 point review of systems negative.    Past Medical History:  Diagnosis Date  . Arthritis    "in my back"  . Breast cancer ([place]) 1996   left breast cancer   . Complication of anesthesia 04/18/2012   "didn't tolerate it today very well; had the shakes and very hard time w/it"  . Depression 11/20/2012  . DVT (deep venous thrombosis) ([place])   . High cholesterol   . History of alcoholism ([place])    7 1/2 years clean  . Hypertension   . Melanoma ([place]) 01/15/2004   upper right arm  . Osteoporosis   . Peripheral vascular disease (HCC)    hx of ligation   . Personal history of radiation therapy 1993   Left Breast Cancer  . SCC (squamous cell carcinoma) 01/15/2004   left post upper arm, Right elbow, post left knee  . SCC (squamous cell carcinoma) 05/21/2013   right forearm, right jawline, left cheek  . SCC (squamous cell carcinoma) 08/24/2016   right shin,right thigh  . SCC (squamous cell carcinoma) 11/01/2016  . SCC (squamous cell carcinoma) 02/02/2018   left outer arm,right outer thigh  . SCC (squamous cell carcinoma) 12/13/2018   right jawline  . Spinal stenosis     Past Surgical History:  Procedure Laterality Date  . BREAST LUMPECTOMY Left 1996  . I & D EXTREMITY  04/17/2012   Procedure: IRRIGATION AND DEBRIDEMENT EXTREMITY;  Surgeon: Rudean Haskell, MD;  Location: [place];  Service: Orthopedics;  Laterality: Right;  . KNEE ARTHROSCOPY  04/17/2012   Procedure: ARTHROSCOPY  KNEE;  Surgeon: Rudean Haskell, MD;  Location: [place];  Service: Orthopedics;  Laterality: Right;  . KYPHOPLASTY  2009, 2013   thorasic, lumbar  . MENISECTOMY Right 04/11/2012  . ORIF FEMUR FRACTURE Left 11/01/2019   Procedure: OPEN REDUCTION INTERNAL FIXATION (ORIF) DISTAL FEMUR FRACTURE;  Surgeon: Altamese [place], MD;  Location: [place];  Service: Orthopedics;  Laterality: Left;  . ORIF PERIPROSTHETIC FRACTURE Right 05/03/2018   Procedure: RIGHT RETROGRADE  FEMORAL NAIL;  Surgeon: Rod Can, MD;  Location: [facility];  Service: Orthopedics;  Laterality: Right;  . POSTERIOR LAMINECTOMY / DECOMPRESSION LUMBAR SPINE  1980's  . rotator cuff surgery  Right   . TONSILLECTOMY AND ADENOIDECTOMY     "I was a child"  . TOTAL KNEE ARTHROPLASTY Right 10/29/2012   Procedure: RIGHT TOTAL KNEE ARTHROPLASTY;  Surgeon: Gearlean Alf, MD;  Location: [facility];  Service: Orthopedics;  Laterality: Right;  . VEIN LIGATION       reports that she has never smoked. She has never used smokeless tobacco. She reports that she does not drink alcohol and does not use drugs.  Allergies  Allergen Reactions  . Oysters [Shellfish Allergy] Nausea And Vomiting  . Codeine Nausea And Vomiting    Family History  Problem Relation Age of Onset  . Kidney failure Mother   . Anuerysm Father        AAA     Prior to Admission medications   Medication Sig Start Date End Date Taking? Authorizing Provider  acetaminophen (TYLENOL) 500 MG tablet Take 1,000 mg by mouth daily as needed (back pain).    [provider]  Amino Acids-Protein Hydrolys (FEEDING SUPPLEMENT, PRO-STAT SUGAR FREE 64,) LIQD Take 30 mLs by mouth 2 (two) times daily. 11/08/19   Alma Friendly, MD  apixaban (ELIQUIS) 5 MG TABS tablet Take 1 tablet (5 mg total) by mouth 2 (two) times daily. 11/13/19   Alma Friendly, MD  Calcium Carbonate-Vitamin D (CALCIUM-D) 600-400 MG-UNIT TABS Take 1 tablet by mouth daily.    [provider]  furosemide (LASIX) 40 MG tablet Take 1 tablet (40 mg total) by mouth 3 (three) times a week. 04/13/20 07/12/20  Croitoru, Mihai, MD  losartan (COZAAR) 25 MG tablet Take 0.5 tablets (12.5 mg total) by mouth daily. 11/09/19   Alma Friendly, MD  Multiple Vitamins-Minerals (MULTIVITAMIN ADULT) TABS Take 1 tablet by mouth daily.  08/17/09   [provider]  polyethylene glycol (MIRALAX / GLYCOLAX) 17 g packet Take 17 g by mouth daily as needed. 11/08/19    Alma Friendly, MD  rosuvastatin (CRESTOR) 20 MG tablet Take 1 tablet (20 mg total) by mouth daily at 6 PM. 11/08/19   Alma Friendly, MD  senna-docusate (SENOKOT-S) 8.6-50 MG tablet Take 1 tablet by mouth at bedtime. 11/08/19   Alma Friendly, MD    Physical Exam: Vitals:   09/02/20 2005 09/02/20 2010 09/02/20 2023  BP: (!) 143/67    Pulse: 85    Resp: 16    Temp: 98.1 F (36.7 C)    TempSrc: Oral    SpO2: (!) 89%  94%  Weight:  62.3 kg   Height:  5\' 6"  (1.676 m)       Constitutional: Frail, elderly woman, in mild distress due to pain Vitals:   09/02/20 2005 09/02/20 2010 09/02/20 2023  BP: (!) 143/67    Pulse: 85    Resp: 16    Temp: 98.1 F (36.7 C)  TempSrc: Oral    SpO2: (!) 89%  94%  Weight:  62.3 kg   Height:  5\' 6"  (1.676 m)    Eyes: PERRL, lids and conjunctivae normal ENMT: Mucous membranes are dry. Posterior pharynx clear of any exudate or lesions.Normal dentition.  Neck: normal, supple, no masses, no thyromegaly Respiratory: clear to auscultation bilaterally, no wheezing, no crackles. Normal respiratory effort. No accessory muscle use.  Cardiovascular: Regular rate and rhythm, no murmurs / rubs / gallops. No extremity edema. 2+ pedal pulses. No carotid bruits.  Abdomen: no tenderness, no masses palpated. No hepatosplenomegaly. Bowel sounds positive.  Musculoskeletal: no clubbing / cyanosis. No joint deformity upper and lower extremities. Shortening of left lower extremity with lateral rotation normal muscle tone.  Skin: no rashes, lesions, ulcers. No induration Neurologic: CN 2-12 grossly intact. Sensation intact, DTR normal. Strength 5/5 in all 4.  Psychiatric: Normal judgment and insight. Alert and oriented x 3. Normal mood.     Labs on Admission: I have personally reviewed following labs and imaging studies  CBC: No results for input(s): WBC, NEUTROABS, HGB, HCT, MCV, PLT in the last 168 hours. Basic Metabolic Panel: No results for  input(s): NA, K, CL, CO2, GLUCOSE, BUN, CREATININE, CALCIUM, MG, PHOS in the last 168 hours. GFR: CrCl cannot be calculated (Patient's most recent lab result is older than the maximum 21 days allowed.). Liver Function Tests: No results for input(s): AST, ALT, ALKPHOS, BILITOT, PROT, ALBUMIN in the last 168 hours. No results for input(s): LIPASE, AMYLASE in the last 168 hours. No results for input(s): AMMONIA in the last 168 hours. Coagulation Profile: No results for input(s): INR, PROTIME in the last 168 hours. Cardiac Enzymes: No results for input(s): CKTOTAL, CKMB, CKMBINDEX, TROPONINI in the last 168 hours. BNP (last 3 results) No results for input(s): PROBNP in the last 8760 hours. HbA1C: No results for input(s): HGBA1C in the last 72 hours. CBG: No results for input(s): GLUCAP in the last 168 hours. Lipid Profile: No results for input(s): CHOL, HDL, LDLCALC, TRIG, CHOLHDL, LDLDIRECT in the last 72 hours. Thyroid Function Tests: No results for input(s): TSH, T4TOTAL, FREET4, T3FREE, THYROIDAB in the last 72 hours. Anemia Panel: No results for input(s): VITAMINB12, FOLATE, FERRITIN, TIBC, IRON, RETICCTPCT in the last 72 hours. Urine analysis:    Component Value Date/Time   COLORURINE YELLOW 10/19/2014 1450   APPEARANCEUR CLEAR 10/19/2014 1450   LABSPEC 1.044 (H) 10/19/2014 1450   PHURINE 7.5 10/19/2014 1450   GLUCOSEU NEGATIVE 10/19/2014 1450   HGBUR NEGATIVE 10/19/2014 1450   BILIRUBINUR NEGATIVE 10/19/2014 1450   KETONESUR NEGATIVE 10/19/2014 1450   PROTEINUR NEGATIVE 10/19/2014 1450   UROBILINOGEN 1.0 10/19/2014 1450   NITRITE NEGATIVE 10/19/2014 1450   LEUKOCYTESUR SMALL (A) 10/19/2014 1450   Sepsis Labs: @LABRCNTIP (procalcitonin:4,lacticidven:4) )No results found for this or any previous visit (from the past 240 hour(s)).   Radiological Exams on Admission: DG Chest 1 View  Result Date: 09/02/2020 CLINICAL DATA:  Pain status post fall EXAM: CHEST  1 VIEW  COMPARISON:  10/31/2019 FINDINGS: There are multiple old healed left-sided rib fractures. There is no pneumothorax. No large pleural effusion. The heart size is enlarged. There are prominent interstitial lung markings bilaterally which are essentially unchanged from prior study. There is a probable hiatal hernia. IMPRESSION: Stable chest x-ray with cardiomegaly and chronic lung changes. No acute osseous abnormality is identified on this exam. Electronically Signed   By: Constance Holster M.D.   On: 09/02/2020 21:17   DG Pelvis  1-2 Views  Result Date: 09/02/2020 CLINICAL DATA:  87 year old female with fall and trauma to the left hip. EXAM: PELVIS - 1-2 VIEW COMPARISON:  Pelvic radiograph dated 05/02/2018. FINDINGS: There is a comminuted and displaced fracture of the left femoral neck with involvement of the intertrochanteric region. There is mild proximal migration of the femoral shaft in relation to the head. Advanced osteopenia. No dislocation. Partially visualized right femoral intramedullary nail. The soft tissues are unremarkable. IMPRESSION: Comminuted and displaced fracture of the left femoral neck. Electronically Signed   By: Anner Crete M.D.   On: 09/02/2020 21:14   DG Femur Min 2 Views Left  Result Date: 09/02/2020 CLINICAL DATA:  Pain EXAM: LEFT FEMUR 2 VIEWS COMPARISON:  None. FINDINGS: There is an acute, comminuted and displaced intratrochanteric fracture of the proximal left femur. The patient has undergone prior plate screw fixation of the mid to distal femur. There is an old healed fracture of the distal left femur. The hardware is intact. There are end-stage degenerative changes of the left knee. There is an apparent age-indeterminate lateral tibial plateau fracture. There is soft tissue swelling about the knee. There is a small joint effusion. IMPRESSION: 1. Acute comminuted and displaced intratrochanteric fracture of the proximal left femur. 2. Age-indeterminate lateral tibial plateau  fracture. Dedicated knee radiographs are recommended. 3. Old healed fracture of the distal left femur after plate screw fixation. 4. End-stage degenerative changes of the left knee. Electronically Signed   By: Constance Holster M.D.   On: 09/02/2020 21:15    EKG: Independently reviewed. Sinus rhythm with ST depression no significant STEMI involving the lateral leads  Assessment/Plan Principal Problem:   Displaced intertrochanteric fracture of left femur, initial encounter for closed fracture (HCC) Active Problems:   OA (osteoarthritis) of knee   Pure hypercholesterolemia   Essential hypertension, benign   PAT (paroxysmal atrial tachycardia) (HCC)   Prolonged QT interval   Closed fracture of distal end of left femur, initial encounter ([place])     #1 displaced intertrochanteric fracture of the left femur: Patient had traumatic fracture. Orthopedics to see patient in the morning and determine surgical procedure. In the meantime we will admit the patient. Initiate clearance for surgery. We will continue pain management. Hold off on anticoagulants to a level possible surgery tomorrow. N.p.o. after midnight.  #2 abnormal EKG: Per cardiology probably demand ischemia. No recommendations to trend troponins which was very high the last time. We will get echocardiogram in the morning if okay will be cleared for surgery.  #3 essential hypertension: Continue home regimen.  #4 distal femoral fracture: Defer to orthopedics.  #5 coronary artery disease: As per above. Patient will be on telemetry.  #6 hyperlipidemia: Continue statin   DVT prophylaxis: SCD for now Code Status: Full code Family Communication: No family at bedside Disposition Plan: To be determined Consults called: Dr. Altamese [place], orthopedics Admission status: Inpatient  Severity of Illness: The appropriate patient status for this patient is INPATIENT. Inpatient status is judged to be reasonable and necessary in order to provide  the required intensity of service to ensure the patient's safety. The patient's presenting symptoms, physical exam findings, and initial radiographic and laboratory data in the context of their chronic comorbidities is felt to place them at high risk for further clinical deterioration. Furthermore, it is not anticipated that the patient will be medically stable for discharge from the hospital within 2 midnights of admission. The following factors support the patient status of inpatient.   " The  patient's presenting symptoms include fall with left hip pain. " The worrisome physical exam findings include laterally rotated left lower extremity. " The initial radiographic and laboratory data are worrisome because of abnormal EKG and x-ray showing femoral fracture. " The chronic co-morbidities include hypertension coronary artery disease.   * I certify that at the point of admission it is my clinical judgment that the patient will require inpatient hospital care spanning beyond 2 midnights from the point of admission due to high intensity of service, high risk for further deterioration and high frequency of surveillance required.Barbette Merino MD [facility] Pager [phone]  If 7PM-7AM, please contact night-coverage [email or web address] Password [facility]  09/02/2020, 9:44 PM

## 2020-09-02 NOTE — ED Notes (Signed)
Patient transported to X-ray 

## 2020-09-03 ENCOUNTER — Inpatient Hospital Stay (HOSPITAL_COMMUNITY): Payer: Medicare Other

## 2020-09-03 ENCOUNTER — Inpatient Hospital Stay (HOSPITAL_COMMUNITY): Payer: Medicare Other | Admitting: Certified Registered Nurse Anesthetist

## 2020-09-03 ENCOUNTER — Other Ambulatory Visit: Payer: Self-pay

## 2020-09-03 ENCOUNTER — Encounter (HOSPITAL_COMMUNITY): Payer: Self-pay | Admitting: Internal Medicine

## 2020-09-03 ENCOUNTER — Encounter (HOSPITAL_COMMUNITY)
Admission: EM | Disposition: A | Discharge status: Skilled Nursing Facility | Payer: Self-pay | Source: Home / Self Care | Attending: Internal Medicine

## 2020-09-03 DIAGNOSIS — I214 Non-ST elevation (NSTEMI) myocardial infarction: Secondary | ICD-10-CM | POA: Diagnosis not present

## 2020-09-03 DIAGNOSIS — E7800 Pure hypercholesterolemia, unspecified: Secondary | ICD-10-CM | POA: Diagnosis not present

## 2020-09-03 DIAGNOSIS — Z472 Encounter for removal of internal fixation device: Secondary | ICD-10-CM | POA: Diagnosis not present

## 2020-09-03 DIAGNOSIS — I472 Ventricular tachycardia: Secondary | ICD-10-CM | POA: Diagnosis not present

## 2020-09-03 HISTORY — PX: INTRAMEDULLARY (IM) NAIL INTERTROCHANTERIC: SHX5875

## 2020-09-03 HISTORY — PX: HARDWARE REMOVAL: SHX979

## 2020-09-03 LAB — COMPREHENSIVE METABOLIC PANEL
ALT: 30 U/L (ref 0–44)
AST: 29 U/L (ref 15–41)
Albumin: 2.9 g/dL — ABNORMAL LOW (ref 3.5–5.0)
Alkaline Phosphatase: 50 U/L (ref 38–126)
Anion gap: 8 (ref 5–15)
BUN: 20 mg/dL (ref 8–23)
CO2: 27 mmol/L (ref 22–32)
Calcium: 8.9 mg/dL (ref 8.9–10.3)
Chloride: 102 mmol/L (ref 98–111)
Creatinine, Ser: 0.62 mg/dL (ref 0.44–1.00)
GFR, Estimated: >60 mL/min (ref >60)
Glucose, Bld: 219 mg/dL — ABNORMAL HIGH (ref 70–99)
Potassium: 3.5 mmol/L (ref 3.5–5.1)
Sodium: 137 mmol/L (ref 135–145)
Total Bilirubin: 1.1 mg/dL (ref 0.3–1.2)
Total Protein: 5.2 g/dL — ABNORMAL LOW (ref 6.5–8.1)

## 2020-09-03 LAB — PROTIME-INR
INR: 1.2 (ref 0.8–1.2)
Prothrombin Time: 14.5 seconds (ref 11.4–15.2)

## 2020-09-03 LAB — CBC
HCT: 34.9 % — ABNORMAL LOW (ref 36.0–46.0)
Hemoglobin: 11.2 g/dL — ABNORMAL LOW (ref 12.0–15.0)
MCH: 30.5 pg (ref 26.0–34.0)
MCHC: 32.1 g/dL (ref 30.0–36.0)
MCV: 95.1 fL (ref 80.0–100.0)
Platelets: 151 10*3/uL (ref 150–400)
RBC: 3.67 MIL/uL — ABNORMAL LOW (ref 3.87–5.11)
RDW: 13.8 % (ref 11.5–15.5)
WBC: 8.1 10*3/uL (ref 4.0–10.5)
nRBC: 0 % (ref 0.0–0.2)

## 2020-09-03 LAB — SURGICAL PCR SCREEN
MRSA, PCR: NEGATIVE
Staphylococcus aureus: NEGATIVE

## 2020-09-03 LAB — VITAMIN D 25 HYDROXY (VIT D DEFICIENCY, FRACTURES): Vit D, 25-Hydroxy: 55 ng/mL (ref 30–100)

## 2020-09-03 LAB — APTT: aPTT: 31 seconds (ref 24–36)

## 2020-09-03 LAB — GLUCOSE, CAPILLARY: Glucose-Capillary: 150 mg/dL — ABNORMAL HIGH (ref 70–99)

## 2020-09-03 SURGERY — REMOVAL, HARDWARE
Anesthesia: General | Site: Leg Upper | Laterality: Left

## 2020-09-03 MED ORDER — LACTATED RINGERS IV SOLN: INTRAVENOUS | Status: DC | PRN

## 2020-09-03 MED ORDER — PROPOFOL 10 MG/ML IV BOLUS
INTRAVENOUS | Status: DC | PRN
  Administered 2020-09-03: 50 mg via INTRAVENOUS

## 2020-09-03 MED ORDER — DEXAMETHASONE SODIUM PHOSPHATE 10 MG/ML IJ SOLN
INTRAMUSCULAR | Status: DC | PRN
  Administered 2020-09-03: 5 mg via INTRAVENOUS

## 2020-09-03 MED ORDER — MORPHINE SULFATE (PF) 2 MG/ML IV SOLN: 0.5000 mg | INTRAVENOUS | Status: DC | PRN

## 2020-09-03 MED ORDER — MUPIROCIN 2 % EX OINT
1.0000 "application " | TOPICAL_OINTMENT | Freq: Two times a day (BID) | CUTANEOUS | Status: AC
  Administered 2020-09-03 – 2020-09-07 (×8): 1 via NASAL
  Filled 2020-09-03 (×3): qty 22

## 2020-09-03 MED ORDER — ONDANSETRON HCL 4 MG/2ML IJ SOLN
INTRAMUSCULAR | Status: AC
  Filled 2020-09-03: qty 2

## 2020-09-03 MED ORDER — ONDANSETRON HCL 4 MG/2ML IJ SOLN: 4.0000 mg | Freq: Four times a day (QID) | INTRAMUSCULAR | Status: DC | PRN

## 2020-09-03 MED ORDER — ONDANSETRON HCL 4 MG PO TABS: 4.0000 mg | ORAL_TABLET | Freq: Four times a day (QID) | ORAL | Status: DC | PRN

## 2020-09-03 MED ORDER — LIDOCAINE 2% (20 MG/ML) 5 ML SYRINGE
INTRAMUSCULAR | Status: DC | PRN
  Administered 2020-09-03: 60 mg via INTRAVENOUS

## 2020-09-03 MED ORDER — PHENYLEPHRINE HCL-NACL 10-0.9 MG/250ML-% IV SOLN
INTRAVENOUS | Status: DC | PRN
  Administered 2020-09-03: 20 ug/min via INTRAVENOUS

## 2020-09-03 MED ORDER — FENTANYL CITRATE (PF) 100 MCG/2ML IJ SOLN
INTRAMUSCULAR | Status: AC
  Filled 2020-09-03: qty 2

## 2020-09-03 MED ORDER — MENTHOL 3 MG MT LOZG: 1.0000 | LOZENGE | OROMUCOSAL | Status: DC | PRN

## 2020-09-03 MED ORDER — FENTANYL CITRATE (PF) 100 MCG/2ML IJ SOLN
25.0000 ug | INTRAMUSCULAR | Status: DC | PRN
Start: 2020-09-03 — End: 2020-09-03
  Administered 2020-09-03: 25 ug via INTRAVENOUS

## 2020-09-03 MED ORDER — SUGAMMADEX SODIUM 200 MG/2ML IV SOLN
INTRAVENOUS | Status: DC | PRN
  Administered 2020-09-03: 200 mg via INTRAVENOUS

## 2020-09-03 MED ORDER — FENTANYL CITRATE (PF) 250 MCG/5ML IJ SOLN
INTRAMUSCULAR | Status: AC
  Filled 2020-09-03: qty 5

## 2020-09-03 MED ORDER — FENTANYL CITRATE (PF) 250 MCG/5ML IJ SOLN
INTRAMUSCULAR | Status: DC | PRN
  Administered 2020-09-03 (×2): 25 ug via INTRAVENOUS
  Administered 2020-09-03: 50 ug via INTRAVENOUS

## 2020-09-03 MED ORDER — MORPHINE SULFATE (PF) 2 MG/ML IV SOLN: 1.0000 mg | INTRAVENOUS | Status: DC | PRN

## 2020-09-03 MED ORDER — ROCURONIUM BROMIDE 10 MG/ML (PF) SYRINGE
PREFILLED_SYRINGE | INTRAVENOUS | Status: DC | PRN
  Administered 2020-09-03: 55 mg via INTRAVENOUS
  Administered 2020-09-03: 15 mg via INTRAVENOUS

## 2020-09-03 MED ORDER — ROCURONIUM BROMIDE 10 MG/ML (PF) SYRINGE
PREFILLED_SYRINGE | INTRAVENOUS | Status: AC
  Filled 2020-09-03: qty 10

## 2020-09-03 MED ORDER — ONDANSETRON HCL 4 MG/2ML IJ SOLN
INTRAMUSCULAR | Status: DC | PRN
  Administered 2020-09-03: 4 mg via INTRAVENOUS

## 2020-09-03 MED ORDER — LIDOCAINE 2% (20 MG/ML) 5 ML SYRINGE
INTRAMUSCULAR | Status: AC
  Filled 2020-09-03: qty 5

## 2020-09-03 MED ORDER — PHENOL 1.4 % MT LIQD: 1.0000 | OROMUCOSAL | Status: DC | PRN

## 2020-09-03 MED ORDER — HYDROCODONE-ACETAMINOPHEN 5-325 MG PO TABS
1.0000 | ORAL_TABLET | Freq: Four times a day (QID) | ORAL | Status: DC | PRN
  Administered 2020-09-03 – 2020-09-06 (×4): 1 via ORAL
  Filled 2020-09-03 (×4): qty 1

## 2020-09-03 MED ORDER — ACETAMINOPHEN 325 MG PO TABS
650.0000 mg | ORAL_TABLET | Freq: Three times a day (TID) | ORAL | Status: DC
  Administered 2020-09-04 – 2020-09-11 (×20): 650 mg via ORAL
  Filled 2020-09-03 (×23): qty 2

## 2020-09-03 MED ORDER — 0.9 % SODIUM CHLORIDE (POUR BTL) OPTIME
TOPICAL | Status: DC | PRN
  Administered 2020-09-03: 1000 mL

## 2020-09-03 MED ORDER — VANCOMYCIN HCL 1000 MG IV SOLR
INTRAVENOUS | Status: AC
  Filled 2020-09-03: qty 1000

## 2020-09-03 MED ORDER — CEFAZOLIN SODIUM-DEXTROSE 2-4 GM/100ML-% IV SOLN
2.0000 g | Freq: Four times a day (QID) | INTRAVENOUS | Status: AC
  Administered 2020-09-03 (×2): 2 g via INTRAVENOUS
  Filled 2020-09-03 (×2): qty 100

## 2020-09-03 MED ORDER — ESMOLOL HCL 100 MG/10ML IV SOLN
INTRAVENOUS | Status: AC
  Filled 2020-09-03: qty 10

## 2020-09-03 MED ORDER — HYDROMORPHONE HCL 1 MG/ML IJ SOLN
0.5000 mg | Freq: Once | INTRAMUSCULAR | Status: AC | PRN
  Administered 2020-09-03: 0.5 mg via INTRAVENOUS
  Filled 2020-09-03: qty 1

## 2020-09-03 MED ORDER — EPHEDRINE SULFATE-NACL 50-0.9 MG/10ML-% IV SOSY
PREFILLED_SYRINGE | INTRAVENOUS | Status: DC | PRN
  Administered 2020-09-03 (×2): 10 mg via INTRAVENOUS

## 2020-09-03 MED ORDER — ENOXAPARIN SODIUM 40 MG/0.4ML ~~LOC~~ SOLN
40.0000 mg | SUBCUTANEOUS | Status: DC
  Administered 2020-09-04: 40 mg via SUBCUTANEOUS
  Filled 2020-09-03: qty 0.4

## 2020-09-03 MED ORDER — ALBUMIN HUMAN 5 % IV SOLN: INTRAVENOUS | Status: DC | PRN

## 2020-09-03 MED ORDER — PROPOFOL 10 MG/ML IV BOLUS
INTRAVENOUS | Status: AC
  Filled 2020-09-03: qty 20

## 2020-09-03 MED ORDER — DEXAMETHASONE SODIUM PHOSPHATE 10 MG/ML IJ SOLN
INTRAMUSCULAR | Status: AC
  Filled 2020-09-03: qty 1

## 2020-09-03 MED ORDER — DOCUSATE SODIUM 100 MG PO CAPS
100.0000 mg | ORAL_CAPSULE | Freq: Two times a day (BID) | ORAL | Status: DC
  Administered 2020-09-03 – 2020-09-05 (×5): 100 mg via ORAL
  Filled 2020-09-03 (×5): qty 1

## 2020-09-03 MED ORDER — PHENYLEPHRINE 40 MCG/ML (10ML) SYRINGE FOR IV PUSH (FOR BLOOD PRESSURE SUPPORT)
PREFILLED_SYRINGE | INTRAVENOUS | Status: DC | PRN
  Administered 2020-09-03 (×2): 80 ug via INTRAVENOUS
  Administered 2020-09-03: 40 ug via INTRAVENOUS

## 2020-09-03 MED ORDER — PHENYLEPHRINE HCL-NACL 10-0.9 MG/250ML-% IV SOLN: INTRAVENOUS | Status: DC | PRN

## 2020-09-03 MED ORDER — PHENYLEPHRINE 40 MCG/ML (10ML) SYRINGE FOR IV PUSH (FOR BLOOD PRESSURE SUPPORT)
PREFILLED_SYRINGE | INTRAVENOUS | Status: AC
  Filled 2020-09-03: qty 10

## 2020-09-03 MED ORDER — EPHEDRINE 5 MG/ML INJ
INTRAVENOUS | Status: AC
  Filled 2020-09-03: qty 10

## 2020-09-03 SURGICAL SUPPLY — 82 items
BANDAGE ESMARK 6X9 LF (GAUZE/BANDAGES/DRESSINGS) IMPLANT
BIT DRILL 4.0X280 (BIT) ×2 IMPLANT
BNDG COHESIVE 6X5 TAN STRL LF (GAUZE/BANDAGES/DRESSINGS) ×2 IMPLANT
BNDG ELASTIC 4X5.8 VLCR STR LF (GAUZE/BANDAGES/DRESSINGS) ×2 IMPLANT
BNDG ELASTIC 6X5.8 VLCR STR LF (GAUZE/BANDAGES/DRESSINGS) IMPLANT
BNDG ESMARK 6X9 LF (GAUZE/BANDAGES/DRESSINGS)
BNDG GAUZE ELAST 4 BULKY (GAUZE/BANDAGES/DRESSINGS) IMPLANT
BRUSH SCRUB EZ PLAIN DRY (MISCELLANEOUS) ×4 IMPLANT
COVER PERINEAL POST (MISCELLANEOUS) ×2 IMPLANT
COVER SURGICAL LIGHT HANDLE (MISCELLANEOUS) ×4 IMPLANT
COVER WAND RF STERILE (DRAPES) IMPLANT
CUFF TOURN SGL QUICK 18X4 (TOURNIQUET CUFF) IMPLANT
CUFF TOURN SGL QUICK 24 (TOURNIQUET CUFF)
CUFF TOURN SGL QUICK 34 (TOURNIQUET CUFF)
CUFF TRNQT CYL 24X4X16.5-23 (TOURNIQUET CUFF) IMPLANT
CUFF TRNQT CYL 34X4.125X (TOURNIQUET CUFF) IMPLANT
DRAPE C-ARM 42X72 X-RAY (DRAPES) IMPLANT
DRAPE C-ARMOR (DRAPES) ×2 IMPLANT
DRAPE HALF SHEET 40X57 (DRAPES) IMPLANT
DRAPE INCISE 23X17 IOBAN STRL (DRAPES) ×1
DRAPE INCISE IOBAN 23X17 STRL (DRAPES) ×1 IMPLANT
DRAPE ORTHO SPLIT 77X108 STRL (DRAPES) ×4
DRAPE STERI IOBAN 125X83 (DRAPES) ×2 IMPLANT
DRAPE SURG ORHT 6 SPLT 77X108 (DRAPES) ×2 IMPLANT
DRAPE U-SHAPE 47X51 STRL (DRAPES) ×2 IMPLANT
DRSG ADAPTIC 3X8 NADH LF (GAUZE/BANDAGES/DRESSINGS) IMPLANT
DRSG EMULSION OIL 3X3 NADH (GAUZE/BANDAGES/DRESSINGS) ×2 IMPLANT
DRSG MEPILEX BORDER 4X4 (GAUZE/BANDAGES/DRESSINGS) ×2 IMPLANT
DRSG MEPILEX BORDER 4X8 (GAUZE/BANDAGES/DRESSINGS) ×4 IMPLANT
ELECT REM PT RETURN 9FT ADLT (ELECTROSURGICAL) ×2
ELECTRODE REM PT RTRN 9FT ADLT (ELECTROSURGICAL) ×1 IMPLANT
GAUZE SPONGE 4X4 12PLY STRL (GAUZE/BANDAGES/DRESSINGS) IMPLANT
GLOVE BIO SURGEON STRL SZ7.5 (GLOVE) ×2 IMPLANT
GLOVE BIO SURGEON STRL SZ8 (GLOVE) ×2 IMPLANT
GLOVE BIOGEL PI IND STRL 7.5 (GLOVE) ×1 IMPLANT
GLOVE BIOGEL PI INDICATOR 7.5 (GLOVE) ×1
GLOVE SRG 8 PF TXTR STRL LF DI (GLOVE) ×1 IMPLANT
GLOVE SURG UNDER POLY LF SZ8 (GLOVE) ×2
GOWN STRL REUS W/ TWL LRG LVL3 (GOWN DISPOSABLE) ×2 IMPLANT
GOWN STRL REUS W/ TWL XL LVL3 (GOWN DISPOSABLE) ×1 IMPLANT
GOWN STRL REUS W/TWL LRG LVL3 (GOWN DISPOSABLE) ×4
GOWN STRL REUS W/TWL XL LVL3 (GOWN DISPOSABLE) ×2
GUIDEWIRE BALL NOSE 3.0X900 (WIRE) ×2
GUIDEWIRE ORTH 900X3XBALL NOSE (WIRE) ×1 IMPLANT
KIT BASIN OR (CUSTOM PROCEDURE TRAY) ×2 IMPLANT
KIT TURNOVER KIT B (KITS) ×2 IMPLANT
MANIFOLD NEPTUNE II (INSTRUMENTS) ×2 IMPLANT
NAIL TROCH ES RT 10X39X130 (Nail) ×2 IMPLANT
NEEDLE 22X1 1/2 (OR ONLY) (NEEDLE) IMPLANT
NS IRRIG 1000ML POUR BTL (IV SOLUTION) ×2 IMPLANT
PACK GENERAL/GYN (CUSTOM PROCEDURE TRAY) ×2 IMPLANT
PACK ORTHO EXTREMITY (CUSTOM PROCEDURE TRAY) ×2 IMPLANT
PAD ARMBOARD 7.5X6 YLW CONV (MISCELLANEOUS) ×4 IMPLANT
PADDING CAST COTTON 6X4 STRL (CAST SUPPLIES) IMPLANT
PIN GUIDE AO 3.2X381 (ORTHOPEDIC DISPOSABLE SUPPLIES) ×2 IMPLANT
PIN GUIDE THRD AR 3.2X330 (PIN) ×2 IMPLANT
SCREW LEFTY LAG 10.5X100 (Screw) ×2 IMPLANT
SCREW LOCK CORT 5X34 (Screw) ×2 IMPLANT
SPONGE LAP 18X18 RF (DISPOSABLE) IMPLANT
STAPLER SKIN 35 WIDE (STAPLE) ×2 IMPLANT
STAPLER VISISTAT 35W (STAPLE) ×2 IMPLANT
STOCKINETTE IMPERVIOUS LG (DRAPES) ×2 IMPLANT
STRIP CLOSURE SKIN 1/2X4 (GAUZE/BANDAGES/DRESSINGS) IMPLANT
SUCTION FRAZIER HANDLE 10FR (MISCELLANEOUS)
SUCTION TUBE FRAZIER 10FR DISP (MISCELLANEOUS) IMPLANT
SUT ETHILON 2 0 FS 18 (SUTURE) ×4 IMPLANT
SUT ETHILON 3 0 PS 1 (SUTURE) ×2 IMPLANT
SUT PDS AB 2-0 CT1 27 (SUTURE) IMPLANT
SUT VIC AB 0 CT1 27 (SUTURE) ×2
SUT VIC AB 0 CT1 27XBRD ANBCTR (SUTURE) ×1 IMPLANT
SUT VIC AB 1 CT1 27 (SUTURE) ×2
SUT VIC AB 1 CT1 27XBRD ANBCTR (SUTURE) ×1 IMPLANT
SUT VIC AB 2-0 CT1 27 (SUTURE) ×2
SUT VIC AB 2-0 CT1 TAPERPNT 27 (SUTURE) ×1 IMPLANT
SYR CONTROL 10ML LL (SYRINGE) IMPLANT
TOOL ACTIVATION (INSTRUMENTS) ×2 IMPLANT
TOWEL GREEN STERILE (TOWEL DISPOSABLE) ×4 IMPLANT
TOWEL GREEN STERILE FF (TOWEL DISPOSABLE) ×4 IMPLANT
TUBE CONNECTING 12X1/4 (SUCTIONS) ×2 IMPLANT
UNDERPAD 30X36 HEAVY ABSORB (UNDERPADS AND DIAPERS) IMPLANT
WATER STERILE IRR 1000ML POUR (IV SOLUTION) ×4 IMPLANT
YANKAUER SUCT BULB TIP NO VENT (SUCTIONS) ×4 IMPLANT

## 2020-09-03 NOTE — Op Note (Signed)
09/03/2020  11:07 AM  PATIENT:  [name]  87 y.o. female  PRE-OPERATIVE DIAGNOSIS:   1. Left hip femur fracture. 2. Retained hardware left femur.  POST-OPERATIVE DIAGNOSIS:  1. Left hip femur fracture. 2. Retained hardware left femur.  PROCEDURE:  1. INTRAMEDULLARY NAILING OF THE left HIP using Arthrex AOS Galileo Nail, 10 x 390 mm, statically locked 2. REMOVAL OF DEEP IMPLANT LEFT FEMUR.  SURGEON:  Astrid Divine. Marcelino Scot, M.D.  ASSISTANT:  1. Ainsley Spinner, PA-C.; 2. PA Student  ANESTHESIA:  General.  COMPLICATIONS:  None.  ESTIMATED BLOOD LOSS:  Less than 150 mL.  DISPOSITION:  To PACU.  CONDITION:  Stable.  DELAY START OF DVT PROPHYLAXIS BECAUSE OF BLEEDING RISK: NO  BRIEF SUMMARY AND INDICATION OF PROCEDURE:  [name] is a 87 y.o. year-old with multiple medical problems.  I discussed with the patient and husband risks and benefits of surgical treatment including the potential for malunion, nonunion, symptomatic hardware, heart attack, stroke, neurovascular injury, bleeding, and others.  After full discussion, the patient and family wished to proceed.  BRIEF SUMMARY OF PROCEDURE:  The patient was taken to the operating room where general anesthesia was induced.  She was positioned supine on the Hana fracture table.  A closed reduction maneuver was performed of the fractured proximal femur and this was confirmed on both AP and lateral xray views. A thorough scrub and wash with chlorhexidine and then Betadine scrub and paint was performed.After sterile drapes and time-out, a long instrument was used to identify proximal screws within the distal femoral locking plate that were in the shaft. Small stab incisions were then made and the screw driver engaged into the screw head and the screw withdrawn. I removed two additional screws with same technique through two additional small incisions.   I then used C-arm to identify the appropriate starting position of the  hip nail on both AP and lateral images.  A 3 cm incision was made proximal to the greater trochanter.  The curved cannulated awl was inserted just medial to the tip of the lateral trochanter and then the starting guidewire advanced into the proximal femur.  This was checked on AP and lateral views.  The starting reamer was engaged with the soft tissue protected by a sleeve.  The curved ball-tipped guidewire was then inserted, making sure it was as posterior as possible in the distal femur and across the fracture site, which stayed in a reduced position.  It was sequentially reamed up to 12 and an 10 * 390 mm nail inserted to the appropriate depth.  The guidewire for the lag screw was then inserted with the appropriate anteversion to make sure it was in a center-center position.  This was measured and after passage of another pin in parallel to prevent rotation, the lag screw placed with excellent purchase and position checked on both views.  Traction was released and compression achieved with the compression device.  This was followed by placement of one distal locking screw using the guide through an additional stab incision. The screw was just proximal to the plate and located within the nail using perfect circle technique, confirmed on AP and lateral images. Wounds were irrigated thoroughly, then closed in a standard layered fashion. Sterile gently compressive dressings were applied.  Ainsley Spinner, PA-C, and a PA student assisted throughout.  The patient was awakened from anesthesia and transported to the PACU in stable condition.  PROGNOSIS:  The patient will be weightbearing as tolerated  with physical therapy resuming Eliquis immediately.  She has no range of motion precautions.  We will continue to follow through at the hospital.  Anticipate follow up in the office in 2 weeks for removal of sutures and further evaluation.   Astrid Divine. Marcelino Scot, M.D.

## 2020-09-03 NOTE — Anesthesia Procedure Notes (Signed)
Procedure Name: Intubation Date/Time: 09/03/2020 8:42 AM Performed by: Reece Agar, CRNA Pre-anesthesia Checklist: Patient identified, Emergency Drugs available, Suction available and Patient being monitored Patient Re-evaluated:Patient Re-evaluated prior to induction Oxygen Delivery Method: Circle System Utilized Preoxygenation: Pre-oxygenation with 100% oxygen Induction Type: IV induction Ventilation: Mask ventilation without difficulty Laryngoscope Size: Mac and 3 Grade View: Grade I Tube type: Oral Tube size: 7.0 mm Number of attempts: 1 Airway Equipment and Method: Stylet Placement Confirmation: ETT inserted through vocal cords under direct vision,  positive ETCO2 and breath sounds checked- equal and bilateral Secured at: 22 cm Tube secured with: Tape Dental Injury: Teeth and Oropharynx as per pre-operative assessment

## 2020-09-03 NOTE — ED Notes (Signed)
Paged attending for RN 

## 2020-09-03 NOTE — ED Notes (Signed)
Secretary paged provider 

## 2020-09-03 NOTE — Anesthesia Preprocedure Evaluation (Signed)
Anesthesia Evaluation  Patient identified by MRN, date of birth, ID band Patient awake    Reviewed: Allergy & Precautions, NPO status , Patient's Chart, lab work & pertinent test results  Airway Mallampati: II  TM Distance: >3 FB Neck ROM: Full    Dental   Pulmonary neg pulmonary ROS,    breath sounds clear to auscultation       Cardiovascular hypertension, Pt. on medications + Past MI, + Peripheral Vascular Disease and + DVT   Rhythm:Regular Rate:Normal     Neuro/Psych negative neurological ROS     GI/Hepatic negative GI ROS, Neg liver ROS,   Endo/Other  negative endocrine ROS  Renal/GU negative Renal ROS     Musculoskeletal  (+) Arthritis ,   Abdominal   Peds  Hematology  (+) Blood dyscrasia, anemia ,   Anesthesia Other Findings   Reproductive/Obstetrics                             Lab Results  Component Value Date   WBC 8.1 09/03/2020   HGB 11.2 (L) 09/03/2020   HCT 34.9 (L) 09/03/2020   MCV 95.1 09/03/2020   PLT 151 09/03/2020   Lab Results  Component Value Date   CREATININE 0.62 09/03/2020   BUN 20 09/03/2020   NA 137 09/03/2020   K 3.5 09/03/2020   CL 102 09/03/2020   CO2 27 09/03/2020    Anesthesia Physical Anesthesia Plan  ASA: IV  Anesthesia Plan: General   Post-op Pain Management:    Induction: Intravenous  PONV Risk Score and Plan: 3 and Ondansetron, Dexamethasone and Treatment may vary due to age or medical condition  Airway Management Planned: Oral ETT  Additional Equipment:   Intra-op Plan:   Post-operative Plan: Extubation in OR  Informed Consent: I have reviewed the patients History and Physical, chart, labs and discussed the procedure including the risks, benefits and alternatives for the proposed anesthesia with the patient or authorized representative who has indicated his/her understanding and acceptance.     Dental advisory given  Plan  Discussed with: CRNA  Anesthesia Plan Comments:         Anesthesia Quick Evaluation

## 2020-09-03 NOTE — Transfer of Care (Signed)
Immediate Anesthesia Transfer of Care Note  Patient: [name]  Procedure(s) Performed: HARDWARE REMOVAL (Left Leg Upper) INTRAMEDULLARY (IM) NAIL INTERTROCHANTRIC (Left Leg Upper)  Patient Location: PACU  Anesthesia Type:General  Level of Consciousness: drowsy  Airway & Oxygen Therapy: Patient Spontanous Breathing and Patient connected to face mask oxygen  Post-op Assessment: Report given to RN and Post -op Vital signs reviewed and stable  Post vital signs: Reviewed and stable  Last Vitals:  Vitals Value Taken Time  BP 117/59 09/03/20 1102  Temp    Pulse 68 09/03/20 1104  Resp 17 09/03/20 1104  SpO2 100 % 09/03/20 1104  Vitals shown include unvalidated device data.  Last Pain:  Vitals:   09/03/20 0656  TempSrc: Oral  PainSc:          Complications: No complications documented.

## 2020-09-03 NOTE — Consult Note (Addendum)
Orthopaedic Trauma Service Consultation  Reason for Consult: Left hip fracture Referring Physician: Antony Blackbird, MD  [name] is an 87 y.o. female.  HPI: Patient fell and sustained deformity and shortening with severe pain of the left hip. This is above a femoral plate with a fracture below that had gone on to healing. On Eliquis chronically.  Past Medical History:  Diagnosis Date  . Arthritis    "in my back"  . Breast cancer ([place]) 1996   left breast cancer   . Complication of anesthesia 04/18/2012   "didn't tolerate it today very well; had the shakes and very hard time w/it"  . Depression 11/20/2012  . DVT (deep venous thrombosis) ([place])   . High cholesterol   . History of alcoholism ([place])    7 1/2 years clean  . Hypertension   . Melanoma ([place]) 01/15/2004   upper right arm  . Osteoporosis   . Peripheral vascular disease ([facility])    hx of ligation   . Personal history of radiation therapy 1993   Left Breast Cancer  . SCC (squamous cell carcinoma) 01/15/2004   left post upper arm, Right elbow, post left knee  . SCC (squamous cell carcinoma) 05/21/2013   right forearm, right jawline, left cheek  . SCC (squamous cell carcinoma) 08/24/2016   right shin,right thigh  . SCC (squamous cell carcinoma) 11/01/2016  . SCC (squamous cell carcinoma) 02/02/2018   left outer arm,right outer thigh  . SCC (squamous cell carcinoma) 12/13/2018   right jawline  . Spinal stenosis     Past Surgical History:  Procedure Laterality Date  . BREAST LUMPECTOMY Left 1996  . I & D EXTREMITY  04/17/2012   Procedure: IRRIGATION AND DEBRIDEMENT EXTREMITY;  Surgeon: Rudean Haskell, MD;  Location: [facility];  Service: Orthopedics;  Laterality: Right;  . KNEE ARTHROSCOPY  04/17/2012   Procedure: ARTHROSCOPY KNEE;  Surgeon: Rudean Haskell, MD;  Location: [facility];  Service: Orthopedics;  Laterality: Right;  . KYPHOPLASTY  2009, 2013   thorasic, lumbar  . MENISECTOMY Right 04/11/2012  . ORIF FEMUR  FRACTURE Left 11/01/2019   Procedure: OPEN REDUCTION INTERNAL FIXATION (ORIF) DISTAL FEMUR FRACTURE;  Surgeon: Altamese [place], MD;  Location: [facility];  Service: Orthopedics;  Laterality: Left;  . ORIF PERIPROSTHETIC FRACTURE Right 05/03/2018   Procedure: RIGHT RETROGRADE FEMORAL NAIL;  Surgeon: Rod Can, MD;  Location: [facility];  Service: Orthopedics;  Laterality: Right;  . POSTERIOR LAMINECTOMY / DECOMPRESSION LUMBAR SPINE  1980's  . rotator cuff surgery  Right   . TONSILLECTOMY AND ADENOIDECTOMY     "I was a child"  . TOTAL KNEE ARTHROPLASTY Right 10/29/2012   Procedure: RIGHT TOTAL KNEE ARTHROPLASTY;  Surgeon: Gearlean Alf, MD;  Location: [facility];  Service: Orthopedics;  Laterality: Right;  . VEIN LIGATION      Family History  Problem Relation Age of Onset  . Kidney failure Mother   . Anuerysm Father        AAA    Social History:  reports that she has never smoked. She has never used smokeless tobacco. She reports that she does not drink alcohol and does not use drugs.  Allergies:  Allergies  Allergen Reactions  . Oysters [Shellfish Allergy] Nausea And Vomiting  . Codeine Nausea And Vomiting    Medications:  Prior to Admission:  Medications Prior to Admission  Medication Sig Dispense Refill Last Dose  . acetaminophen (TYLENOL) 500 MG tablet Take 1,000 mg by mouth daily as  needed (back pain).     . Amino Acids-Protein Hydrolys (FEEDING SUPPLEMENT, PRO-STAT SUGAR FREE 64,) LIQD Take 30 mLs by mouth 2 (two) times daily. 887 mL 0   . apixaban (ELIQUIS) 5 MG TABS tablet Take 1 tablet (5 mg total) by mouth 2 (two) times daily. 60 tablet    . Calcium Carbonate-Vitamin D (CALCIUM-D) 600-400 MG-UNIT TABS Take 1 tablet by mouth daily.     . furosemide (LASIX) 40 MG tablet Take 1 tablet (40 mg total) by mouth 3 (three) times a week. 36 tablet 1   . losartan (COZAAR) 25 MG tablet Take 0.5 tablets (12.5 mg total) by mouth daily.     . Multiple Vitamins-Minerals (MULTIVITAMIN ADULT) TABS  Take 1 tablet by mouth daily.      . polyethylene glycol (MIRALAX / GLYCOLAX) 17 g packet Take 17 g by mouth daily as needed. 14 each 0   . rosuvastatin (CRESTOR) 20 MG tablet Take 1 tablet (20 mg total) by mouth daily at 6 PM.     . senna-docusate (SENOKOT-S) 8.6-50 MG tablet Take 1 tablet by mouth at bedtime.       Results for orders placed or performed during the hospital encounter of 09/02/20 (from the past 48 hour(s))  Basic metabolic panel     Status: Abnormal   Collection Time: 09/02/20  9:24 PM  Result Value Ref Range   Sodium 138 135 - 145 mmol/L   Potassium 3.5 3.5 - 5.1 mmol/L   Chloride 101 98 - 111 mmol/L   CO2 24 22 - 32 mmol/L   Glucose, Bld 108 (H) 70 - 99 mg/dL    Comment: Glucose reference range applies only to samples taken after fasting for at least 8 hours.   BUN 25 (H) 8 - 23 mg/dL   Creatinine, Ser 0.67 0.44 - 1.00 mg/dL   Calcium 9.3 8.9 - 10.3 mg/dL   GFR, Estimated >60 >60 mL/min    Comment: (NOTE) Calculated using the CKD-EPI Creatinine Equation (2021)    Anion gap 13 5 - 15    Comment: Performed at [facility] [address] [place], [place] [address]  CBC WITH DIFFERENTIAL     Status: Abnormal   Collection Time: 09/02/20  9:24 PM  Result Value Ref Range   WBC 10.3 4.0 - 10.5 K/uL   RBC 3.89 3.87 - 5.11 MIL/uL   Hemoglobin 11.8 (L) 12.0 - 15.0 g/dL   HCT 37.1 36.0 - 46.0 %   MCV 95.4 80.0 - 100.0 fL   MCH 30.3 26.0 - 34.0 pg   MCHC 31.8 30.0 - 36.0 g/dL   RDW 13.8 11.5 - 15.5 %   Platelets 151 150 - 400 K/uL   nRBC 0.0 0.0 - 0.2 %   Neutrophils Relative % 84 %   Neutro Abs 8.7 (H) 1.7 - 7.7 K/uL   Lymphocytes Relative 9 %   Lymphs Abs 0.9 0.7 - 4.0 K/uL   Monocytes Relative 6 %   Monocytes Absolute 0.7 0.1 - 1.0 K/uL   Eosinophils Relative 0 %   Eosinophils Absolute 0.0 0.0 - 0.5 K/uL   Basophils Relative 0 %   Basophils Absolute 0.0 0.0 - 0.1 K/uL   Immature Granulocytes 1 %   Abs Immature Granulocytes 0.05 0.00 - 0.07 K/uL     Comment: Performed at [facility] [address], [place], [place] [address]  Protime-INR     Status: None   Collection Time: 09/02/20  9:24  PM  Result Value Ref Range   Prothrombin Time 14.8 11.4 - 15.2 seconds   INR 1.2 0.8 - 1.2    Comment: (NOTE) INR goal varies based on device and disease states. Performed at [facility], [place] [address] [place], [place] [address]   Type and screen [place]     Status: None   Collection Time: 09/02/20  9:40 PM  Result Value Ref Range   ABO/RH(D) A POS    Antibody Screen NEG    Sample Expiration      09/05/2020,2359 Performed at [facility], [place] [address] [place], [place] [address]   Resp Panel by RT-PCR (Flu A&B, Covid) Nasopharyngeal Swab     Status: None   Collection Time: 09/02/20  9:42 PM   Specimen: Nasopharyngeal Swab; Nasopharyngeal(NP) swabs in vial transport medium  Result Value Ref Range   SARS Coronavirus 2 by RT PCR NEGATIVE NEGATIVE    Comment: (NOTE) SARS-CoV-2 target nucleic acids are NOT DETECTED.  The SARS-CoV-2 RNA is generally detectable in upper respiratory specimens during the acute phase of infection. The lowest concentration of SARS-CoV-2 viral copies this assay can detect is 138 copies/mL. A negative result does not preclude SARS-Cov-2 infection and should not be used as the sole basis for treatment or other patient management decisions. A negative result may occur with  improper specimen collection/handling, submission of specimen other than nasopharyngeal swab, presence of viral mutation(s) within the areas targeted by this assay, and inadequate number of viral copies(<138 copies/mL). A negative result must be combined with clinical observations, patient history, and epidemiological information. The expected result is Negative.  Fact Sheet for Patients:  EntrepreneurPulse.com.au  Fact Sheet for Healthcare Providers:   IncredibleEmployment.be  This test is no t yet approved or cleared by the [place] FDA and  has been authorized for detection and/or diagnosis of SARS-CoV-2 by FDA under an Emergency Use Authorization (EUA). This EUA will remain  in effect (meaning this test can be used) for the duration of the COVID-19 declaration under Section 564(b)(1) of the Act, 21 U.S.C.section 360bbb-3(b)(1), unless the authorization is terminated  or revoked sooner.       Influenza A by PCR NEGATIVE NEGATIVE   Influenza B by PCR NEGATIVE NEGATIVE    Comment: (NOTE) The Xpert Xpress SARS-CoV-2/FLU/RSV plus assay is intended as an aid in the diagnosis of influenza from Nasopharyngeal swab specimens and should not be used as a sole basis for treatment. Nasal washings and aspirates are unacceptable for Xpert Xpress SARS-CoV-2/FLU/RSV testing.  Fact Sheet for Patients: EntrepreneurPulse.com.au  Fact Sheet for Healthcare Providers: IncredibleEmployment.be  This test is not yet approved or cleared by the [place] FDA and has been authorized for detection and/or diagnosis of SARS-CoV-2 by FDA under an Emergency Use Authorization (EUA). This EUA will remain in effect (meaning this test can be used) for the duration of the COVID-19 declaration under Section 564(b)(1) of the Act, 21 U.S.C. section 360bbb-3(b)(1), unless the authorization is terminated or revoked.  Performed at [facility], [place] [address], [place], [place] [address]   Comprehensive metabolic panel     Status: Abnormal   Collection Time: 09/03/20  4:05 AM  Result Value Ref Range   Sodium 137 135 - 145 mmol/L   Potassium 3.5 3.5 - 5.1 mmol/L   Chloride 102 98 - 111 mmol/L   CO2 27 22 - 32 mmol/L   Glucose, Bld 219 (H) 70 - 99 mg/dL  Comment: Glucose reference range applies only to samples taken after fasting for at least 8 hours.   BUN 20 8 - 23 mg/dL   Creatinine, Ser  0.62 0.44 - 1.00 mg/dL   Calcium 8.9 8.9 - 10.3 mg/dL   Total Protein 5.2 (L) 6.5 - 8.1 g/dL   Albumin 2.9 (L) 3.5 - 5.0 g/dL   AST 29 15 - 41 U/L   ALT 30 0 - 44 U/L   Alkaline Phosphatase 50 38 - 126 U/L   Total Bilirubin 1.1 0.3 - 1.2 mg/dL   GFR, Estimated >60 >60 mL/min    Comment: (NOTE) Calculated using the CKD-EPI Creatinine Equation (2021)    Anion gap 8 5 - 15    Comment: Performed at [facility], [place] [address], [place], [place] [address]  CBC     Status: Abnormal   Collection Time: 09/03/20  4:05 AM  Result Value Ref Range   WBC 8.1 4.0 - 10.5 K/uL   RBC 3.67 (L) 3.87 - 5.11 MIL/uL   Hemoglobin 11.2 (L) 12.0 - 15.0 g/dL   HCT 34.9 (L) 36.0 - 46.0 %   MCV 95.1 80.0 - 100.0 fL   MCH 30.5 26.0 - 34.0 pg   MCHC 32.1 30.0 - 36.0 g/dL   RDW 13.8 11.5 - 15.5 %   Platelets 151 150 - 400 K/uL   nRBC 0.0 0.0 - 0.2 %    Comment: Performed at [facility], [place] [address], [place], [place] [address]  Protime-INR     Status: None   Collection Time: 09/03/20  4:05 AM  Result Value Ref Range   Prothrombin Time 14.5 11.4 - 15.2 seconds   INR 1.2 0.8 - 1.2    Comment: (NOTE) INR goal varies based on device and disease states. Performed at [facility], [place] [address] [place], [place] [address]   APTT     Status: None   Collection Time: 09/03/20  4:05 AM  Result Value Ref Range   aPTT 31 24 - 36 seconds    Comment: Performed at [facility] [address] [place], [place] [address]  Surgical PCR screen     Status: None   Collection Time: 09/03/20  4:37 AM   Specimen: Nasal Mucosa; Nasal Swab  Result Value Ref Range   MRSA, PCR NEGATIVE NEGATIVE   Staphylococcus aureus NEGATIVE NEGATIVE    Comment: (NOTE) The Xpert SA Assay (FDA approved for NASAL specimens in patients 61 years of age and older), is one component of a comprehensive surveillance program. It is not intended to diagnose infection nor to guide or monitor treatment. Performed at  [facility], [place] [address], [place], [place] [address]   Glucose, capillary     Status: Abnormal   Collection Time: 09/03/20  7:42 AM  Result Value Ref Range   Glucose-Capillary 150 (H) 70 - 99 mg/dL    Comment: Glucose reference range applies only to samples taken after fasting for at least 8 hours.    DG Chest 1 View  Result Date: 09/02/2020 CLINICAL DATA:  Pain status post fall EXAM: CHEST  1 VIEW COMPARISON:  10/31/2019 FINDINGS: There are multiple old healed left-sided rib fractures. There is no pneumothorax. No large pleural effusion. The heart size is enlarged. There are prominent interstitial lung markings bilaterally which are essentially unchanged from prior study. There is a probable hiatal hernia. IMPRESSION: Stable chest x-ray with cardiomegaly and chronic lung changes.  No acute osseous abnormality is identified on this exam. Electronically Signed   By: Constance Holster M.D.   On: 09/02/2020 21:17   DG Pelvis 1-2 Views  Result Date: 09/02/2020 CLINICAL DATA:  87 year old female with fall and trauma to the left hip. EXAM: PELVIS - 1-2 VIEW COMPARISON:  Pelvic radiograph dated 05/02/2018. FINDINGS: There is a comminuted and displaced fracture of the left femoral neck with involvement of the intertrochanteric region. There is mild proximal migration of the femoral shaft in relation to the head. Advanced osteopenia. No dislocation. Partially visualized right femoral intramedullary nail. The soft tissues are unremarkable. IMPRESSION: Comminuted and displaced fracture of the left femoral neck. Electronically Signed   By: Anner Crete M.D.   On: 09/02/2020 21:14   DG Knee Complete 4 Views Left  Result Date: 09/02/2020 CLINICAL DATA:  Pain EXAM: LEFT KNEE - COMPLETE 4+ VIEW COMPARISON:  X-ray earlier in the same day. FINDINGS: There are advanced tricompartmental degenerative changes of the left knee, greatest within the lateral compartment. There is a small joint effusion without  definite evidence for an acute tibial plateau fracture. The visualized orthopedic hardware is intact. Vascular calcifications are noted. There is soft tissue swelling about the knee. IMPRESSION: Advanced tricompartmental degenerative changes of the left knee with a small joint effusion. No definite evidence for an acute osseous abnormality. Electronically Signed   By: Constance Holster M.D.   On: 09/02/2020 22:01   DG Knee Left Port  Result Date: 09/03/2020 CLINICAL DATA:  Pain EXAM: PORTABLE LEFT KNEE - 1-2 VIEW COMPARISON:  X-ray several hours prior FINDINGS: Examination is limited by patient positioning. Again noted is orthopedic hardware involving the distal femur. There is no evidence for hardware fracture or failure. There are advanced tricompartmental degenerative changes of the knee without definite evidence for an acute displaced fracture or dislocation. There is surrounding soft tissue swelling with a probable small joint effusion. IMPRESSION: Exam limited by patient positioning. No significant interval change. If there is high clinical suspicion for an occult fracture follow-up with CT is recommended. Electronically Signed   By: Constance Holster M.D.   On: 09/03/2020 00:30   DG Femur Min 2 Views Left  Result Date: 09/02/2020 CLINICAL DATA:  Pain EXAM: LEFT FEMUR 2 VIEWS COMPARISON:  None. FINDINGS: There is an acute, comminuted and displaced intratrochanteric fracture of the proximal left femur. The patient has undergone prior plate screw fixation of the mid to distal femur. There is an old healed fracture of the distal left femur. The hardware is intact. There are end-stage degenerative changes of the left knee. There is an apparent age-indeterminate lateral tibial plateau fracture. There is soft tissue swelling about the knee. There is a small joint effusion. IMPRESSION: 1. Acute comminuted and displaced intratrochanteric fracture of the proximal left femur. 2. Age-indeterminate lateral tibial  plateau fracture. Dedicated knee radiographs are recommended. 3. Old healed fracture of the distal left femur after plate screw fixation. 4. End-stage degenerative changes of the left knee. Electronically Signed   By: Constance Holster M.D.   On: 09/02/2020 21:15    Review of Systems  Neurological: Positive for speech change.   No recent fever, bleeding abnormalities, urologic dysfunction, GI problems, or weight gain.  Blood pressure 123/61, pulse (!) 55, temperature 97.7 F (36.5 C), temperature source Oral, resp. rate 14, height 5\' 6"  (1.676 m), weight 62.3 kg, SpO2 100 %. Physical Exam A&O x 4 NCAT No wheezing or chest retractions Heart with RRR Abdomen soft NT  LLE No traumatic wounds, ecchymosis, or rash  Exquisitely tender  No knee or ankle effusion  Knee stable to varus/ valgus and anterior/posterior stress  Sens DPN, SPN, TN intact  Motor EHL, ext, flex, evers intact  PT 2+, No significant edema   Assessment/Plan: Left intertroch above femoral locking plate will require complicated surgery with removal of the proximal screws in the plate, IMN of the hip with unique nail that allows for locking above the plate if there is sufficient room. Will be close and may need to change tactics and try to lock the nail through the plate rather than above or just leave unlocked.  I discussed with the patient and her husband the risks and benefits of surgery, including the possibility of infection, nerve injury, vessel injury, wound breakdown, arthritis, symptomatic hardware, DVT/ PE, loss of motion, malunion, nonunion, and need for further surgery among others.  We also specifically discussed the elevated risk of heart attack, stroke, or other complications given her comorbidities and age.  She acknowledged these risks and wished to proceed.    Altamese [place], MD [facility], [facility] [phone]  09/03/2020  8:12 AM

## 2020-09-03 NOTE — ED Notes (Signed)
Notified Dr. Myna Hidalgo that pt is in severe pain. New orders received. Pt cleaned of urinary incontinence. Removed 7 pink briefs from patient. New brief applied and purewick put in place.

## 2020-09-03 NOTE — Progress Notes (Signed)
[facility] Progress Note  Patient: [name]    PYK:[id number]  DOA: 09/02/2020     Date of Service: the patient was seen and examined on 09/03/2020  Brief hospital course: Past medical history of breast cancer, PE DVT, depression, melanoma and squamous cell cancer, chronic systolic CHF, EF 53%, CAD managed medically, recurrent falls with previous left hip fracture. Patient presented with a fall and left hip pain. Found to have left intertrochanteric fracture again. Cardiology was consulted by ER Due to abnormal EKG recommended no further work-up. Currently plan is cleared for moderate to high risk surgery, monitor for postop recovery.  Assessment and Plan: 1. Displaced left intertrochanteric hip fracture Mechanical fall Patient was in the bathroom,turned around and and slipped and had a fall. Denies having any head injury. Found to have left hip fracture. Orthopedic have been consulted. Surgery recommended. Patient remains at moderate to high risk given her cardiac history but not a candidate for intervention. D/w Husband and was agreeable to the risk involved.  We will monitor postop recovery.  2. Preoperative medical evaluation No Coronary revascularization/CVA within 5 years. Had Recent stress test in 10/2019, which was positive. Pt was deemed high risk for cardiac intervention back then and plan was for medical management based on prior Cardiology notes. Stress test was done as part of pre-op evaluation in the setting of a hip fracture and low EF on Echocardiogram. Pt tolerated the surgery well in 2021.  Can Climb flight of stair, participates in recreational activity,does household chores. No Prior adverse event with anesthesia. No Alcohol use, drug use.  A) Cardiac risk: Based on RCRI  >History of ischemic heart disease (history of MI or a positive exercise test, current complaint of chest pain considered to be secondary to myocardial ischemia, use of nitrate  therapy, or ECG with pathological Q waves; do not count prior coronary revascularization procedure unless one of the other criteria for ischemic heart disease is present) >History of HF  With this the patient is a 10.1 % risk for adverse Cardiac outcome from surgery.  Estimated Risk Probability for Perioperative Myocardial Infarction or Cardiac Arrest: 1.8 %  Lyndel Safe et al.)  Recommend no further work up preop. Cardiology recommended post-op revised Echocardiogram to monitor her CHF.  Be watchful of hydration since the pt has history of CHF. Monitor Ins and Out. Continue aspirin, Hold lisinopril and diuretics.  At risk for bleeding postop. Also at risk for urinary retention. Had history of postop hypotension as well.  3.  Chronic systolic CHF Potential Takotsubo syndrome History of CAD HLD Patient has history of EF of 40 to 45% on echo in April 2021. Stress test with reversible ischemia in April 2021 as well. Managed medically. We will check echocardiogram postoperatively. Continue ARB as much as she can tolerate. Avoid volume overload and monitor for 1. Continue statin  4.  History of PE DVT Varicose vein Bilateral saphenectomy history At risk for bleeding postoperatively. Currently on anticoagulation with Eliquis. Postop will require continued anticoagulation given her increased risk. Monitor for bleeding complication.  5.  Essential hypertension Currently on losartan. As needed diuretic. Holding both of them for now.  6.  PVC/NSVT/junctional tachycardia Unable to tolerate beta-blockers. Monitor.  Diet: N.p.o. for now.  Cardiac diet postoperatively DVT Prophylaxis: Therapeutic Anticoagulation with apixaban  SCDs Start: 09/02/20 2146    Advance goals of care discussion: Full code  Family Communication: no family was present at bedside, at the time of interview.  The pt provided  permission to discuss medical plan with the family. Discussed with husband on the  phone. Opportunity was given to ask question and all questions were answered satisfactorily.   Disposition:  Status is: Inpatient  Remains inpatient appropriate because:Ongoing diagnostic testing needed not appropriate for outpatient work up  Dispo: The patient is from: Home              Anticipated d/c is to: SNF              Anticipated d/c date is: > 3 days              Patient currently is not medically stable to d/c.   Difficult to place patient   Subjective: No chest pain.  No shortness of breath.  No nausea no vomiting but no fever no chills.  No headache No focal deficit. No prior anginal-like symptoms both on rest as well as on exertion.  Physical Exam:  General: Appear in mild distress, no Rash; Oral Mucosa Clear, moist. no Abnormal Neck Mass Or lumps, Conjunctiva normal  Cardiovascular: S1 and S2 Present, no Murmur, Respiratory: good respiratory effort, Bilateral Air entry present and CTA, no Crackles, no wheezes Abdomen: Bowel Sound present, Soft and no tenderness Extremities: no Pedal edema Neurology: alert and oriented to place and person affect appropriate. no new focal deficit Gait not checked due to patient safety concerns  Vitals:   09/03/20 0823 09/03/20 0824 09/03/20 0825 09/03/20 0826  BP: (!) 131/58     Pulse: 61 61 68 64  Resp: 20 (!) 22 18 13   Temp:      TempSrc:      SpO2: 100% 100% 100% 100%  Weight:      Height:       No intake or output data in the 24 hours ending 09/03/20 0831 Filed Weights   09/02/20 2010  Weight: 62.3 kg    Data Reviewed: I have personally reviewed and interpreted daily labs, tele strips, imaging. I reviewed all nursing notes, pharmacy notes, vitals, pertinent old records I have discussed plan of care as described above with RN and patient/family.  CBC: Recent Labs  Lab 09/02/20 2124 09/03/20 0405  WBC 10.3 8.1  NEUTROABS 8.7*  --   HGB 11.8* 11.2*  HCT 37.1 34.9*  MCV 95.4 95.1  PLT 151 938   Basic  Metabolic Panel: Recent Labs  Lab 09/02/20 2124 09/03/20 0405  NA 138 137  K 3.5 3.5  CL 101 102  CO2 24 27  GLUCOSE 108* 219*  BUN 25* 20  CREATININE 0.67 0.62  CALCIUM 9.3 8.9    Studies: DG Chest 1 View  Result Date: 09/02/2020 CLINICAL DATA:  Pain status post fall EXAM: CHEST  1 VIEW COMPARISON:  10/31/2019 FINDINGS: There are multiple old healed left-sided rib fractures. There is no pneumothorax. No large pleural effusion. The heart size is enlarged. There are prominent interstitial lung markings bilaterally which are essentially unchanged from prior study. There is a probable hiatal hernia. IMPRESSION: Stable chest x-ray with cardiomegaly and chronic lung changes. No acute osseous abnormality is identified on this exam. Electronically Signed   By: Constance Holster M.D.   On: 09/02/2020 21:17   DG Pelvis 1-2 Views  Result Date: 09/02/2020 CLINICAL DATA:  87 year old female with fall and trauma to the left hip. EXAM: PELVIS - 1-2 VIEW COMPARISON:  Pelvic radiograph dated 05/02/2018. FINDINGS: There is a comminuted and displaced fracture of the left femoral neck with involvement of the intertrochanteric  region. There is mild proximal migration of the femoral shaft in relation to the head. Advanced osteopenia. No dislocation. Partially visualized right femoral intramedullary nail. The soft tissues are unremarkable. IMPRESSION: Comminuted and displaced fracture of the left femoral neck. Electronically Signed   By: Anner Crete M.D.   On: 09/02/2020 21:14   DG Knee Complete 4 Views Left  Result Date: 09/02/2020 CLINICAL DATA:  Pain EXAM: LEFT KNEE - COMPLETE 4+ VIEW COMPARISON:  X-ray earlier in the same day. FINDINGS: There are advanced tricompartmental degenerative changes of the left knee, greatest within the lateral compartment. There is a small joint effusion without definite evidence for an acute tibial plateau fracture. The visualized orthopedic hardware is intact. Vascular  calcifications are noted. There is soft tissue swelling about the knee. IMPRESSION: Advanced tricompartmental degenerative changes of the left knee with a small joint effusion. No definite evidence for an acute osseous abnormality. Electronically Signed   By: Constance Holster M.D.   On: 09/02/2020 22:01   DG Knee Left Port  Result Date: 09/03/2020 CLINICAL DATA:  Pain EXAM: PORTABLE LEFT KNEE - 1-2 VIEW COMPARISON:  X-ray several hours prior FINDINGS: Examination is limited by patient positioning. Again noted is orthopedic hardware involving the distal femur. There is no evidence for hardware fracture or failure. There are advanced tricompartmental degenerative changes of the knee without definite evidence for an acute displaced fracture or dislocation. There is surrounding soft tissue swelling with a probable small joint effusion. IMPRESSION: Exam limited by patient positioning. No significant interval change. If there is high clinical suspicion for an occult fracture follow-up with CT is recommended. Electronically Signed   By: Constance Holster M.D.   On: 09/03/2020 00:30   DG Femur Min 2 Views Left  Result Date: 09/02/2020 CLINICAL DATA:  Pain EXAM: LEFT FEMUR 2 VIEWS COMPARISON:  None. FINDINGS: There is an acute, comminuted and displaced intratrochanteric fracture of the proximal left femur. The patient has undergone prior plate screw fixation of the mid to distal femur. There is an old healed fracture of the distal left femur. The hardware is intact. There are end-stage degenerative changes of the left knee. There is an apparent age-indeterminate lateral tibial plateau fracture. There is soft tissue swelling about the knee. There is a small joint effusion. IMPRESSION: 1. Acute comminuted and displaced intratrochanteric fracture of the proximal left femur. 2. Age-indeterminate lateral tibial plateau fracture. Dedicated knee radiographs are recommended. 3. Old healed fracture of the distal left femur  after plate screw fixation. 4. End-stage degenerative changes of the left knee. Electronically Signed   By: Constance Holster M.D.   On: 09/02/2020 21:15    Scheduled Meds: . chlorhexidine  60 mL Topical Once  . [MAR Hold] mupirocin ointment  1 application Nasal BID   Continuous Infusions: .  ceFAZolin (ANCEF) IV    . dextrose 5 % and 0.45% NaCl 100 mL/hr at 09/02/20 2332   PRN Meds: [MAR Hold]  morphine injection, [MAR Hold] ondansetron (ZOFRAN) IV  Time spent: 35 minutes  Author: Berle Mull, MD [facility] 09/03/2020 8:31 AM  To reach On-call, see care teams to locate the attending and reach out via [email or web address]. Between 7PM-7AM, please contact night-coverage If you still have difficulty reaching the attending provider, please page the [facility] (Director on Call) for [facility] on amion for assistance.

## 2020-09-03 NOTE — Anesthesia Postprocedure Evaluation (Signed)
Anesthesia Post Note  Patient: [name]  Procedure(s) Performed: HARDWARE REMOVAL (Left Leg Upper) INTRAMEDULLARY (IM) NAIL INTERTROCHANTRIC (Left Leg Upper)     Patient location during evaluation: PACU Anesthesia Type: General Level of consciousness: awake and alert Pain management: pain level controlled Vital Signs Assessment: post-procedure vital signs reviewed and stable Respiratory status: spontaneous breathing, nonlabored ventilation, respiratory function stable and patient connected to nasal cannula oxygen Cardiovascular status: blood pressure returned to baseline and stable Postop Assessment: no apparent nausea or vomiting Anesthetic complications: no   No complications documented.  Last Vitals:  Vitals:   09/03/20 1250 09/03/20 1323  BP: (!) 112/58 (!) 106/50  Pulse: 66 67  Resp: 20 16  Temp: 36.6 C (!) 36.4 C  SpO2: 95% (!) 88%    Last Pain:  Vitals:   09/03/20 1323  TempSrc: Oral  PainSc:                  Tiajuana Amass

## 2020-09-04 ENCOUNTER — Inpatient Hospital Stay (HOSPITAL_COMMUNITY): Payer: Medicare Other

## 2020-09-04 ENCOUNTER — Encounter (HOSPITAL_COMMUNITY): Payer: Self-pay | Admitting: Orthopedic Surgery

## 2020-09-04 DIAGNOSIS — I5030 Unspecified diastolic (congestive) heart failure: Secondary | ICD-10-CM

## 2020-09-04 DIAGNOSIS — I350 Nonrheumatic aortic (valve) stenosis: Secondary | ICD-10-CM

## 2020-09-04 DIAGNOSIS — I351 Nonrheumatic aortic (valve) insufficiency: Secondary | ICD-10-CM

## 2020-09-04 LAB — HEMOGLOBIN AND HEMATOCRIT, BLOOD
HCT: 26.6 % — ABNORMAL LOW (ref 36.0–46.0)
Hemoglobin: 8.5 g/dL — ABNORMAL LOW (ref 12.0–15.0)

## 2020-09-04 LAB — ECHOCARDIOGRAM COMPLETE
AR max vel: 2.19 cm2
AV Area VTI: 2.06 cm2
AV Area mean vel: 1.87 cm2
AV Mean grad: 7 mmHg
AV Peak grad: 13.2 mmHg
Ao pk vel: 1.82 m/s
Area-P 1/2: 3.72 cm2
Height: 66 in
MV VTI: 2.38 cm2
P 1/2 time: 591 msec
S' Lateral: 3.3 cm
Weight: 2197.55 oz

## 2020-09-04 LAB — CBC
HCT: 27.5 % — ABNORMAL LOW (ref 36.0–46.0)
Hemoglobin: 8.8 g/dL — ABNORMAL LOW (ref 12.0–15.0)
MCH: 30.9 pg (ref 26.0–34.0)
MCHC: 32 g/dL (ref 30.0–36.0)
MCV: 96.5 fL (ref 80.0–100.0)
Platelets: 128 10*3/uL — ABNORMAL LOW (ref 150–400)
RBC: 2.85 MIL/uL — ABNORMAL LOW (ref 3.87–5.11)
RDW: 13.9 % (ref 11.5–15.5)
WBC: 8.1 10*3/uL (ref 4.0–10.5)
nRBC: 0 % (ref 0.0–0.2)

## 2020-09-04 LAB — BASIC METABOLIC PANEL
Anion gap: 9 (ref 5–15)
BUN: 12 mg/dL (ref 8–23)
CO2: 25 mmol/L (ref 22–32)
Calcium: 8.4 mg/dL — ABNORMAL LOW (ref 8.9–10.3)
Chloride: 102 mmol/L (ref 98–111)
Creatinine, Ser: 0.61 mg/dL (ref 0.44–1.00)
GFR, Estimated: >60 mL/min (ref >60)
Glucose, Bld: 128 mg/dL — ABNORMAL HIGH (ref 70–99)
Potassium: 3.5 mmol/L (ref 3.5–5.1)
Sodium: 136 mmol/L (ref 135–145)

## 2020-09-04 MED ORDER — APIXABAN 5 MG PO TABS: 5.0000 mg | ORAL_TABLET | Freq: Two times a day (BID) | ORAL | Status: DC

## 2020-09-04 MED ORDER — APIXABAN 5 MG PO TABS
5.0000 mg | ORAL_TABLET | Freq: Two times a day (BID) | ORAL | Status: DC
  Administered 2020-09-04 – 2020-09-11 (×15): 5 mg via ORAL
  Filled 2020-09-04 (×9): qty 1
  Filled 2020-09-04: qty 2
  Filled 2020-09-04 (×4): qty 1

## 2020-09-04 NOTE — Evaluation (Signed)
Physical Therapy Evaluation Patient Details Name: [name] MRN: [id number] DOB: [birth year] Today's Date: 09/04/2020   History of Present Illness  87 y.o. female with a past medical history significant for breast cancer, DVT, skin cancer, hypertension, hypercholesterolemia, osteoarthritis, and multiple leg fractures in the past who presents with a fall and left hip injury. Patient sustained L hip fx. Now s/p IM nail of L hip and removal of deep implant L femur from previous fx on 2/10.  Clinical Impression  PTA, patient lives with husband and reports independence with RW for mobility. Patient presents with generalized weakness, impaired balance, decreased activity tolerance, impaired cognition, and impaired functional mobility. Patient requires heavy +2 assistance for mobility. Patient with confusion and difficulty word finding this session. Patient will benefit from skilled PT services during acute stay to address listed deficits. Recommend SNF following discharge to maximize functional mobility and safety.     Follow Up Recommendations SNF;Supervision/Assistance - 24 hour    Equipment Recommendations  Rolling Pierina Schuknecht with 5" wheels;3in1 (PT);Wheelchair (measurements PT);Wheelchair cushion (measurements PT)    Recommendations for Other Services       Precautions / Restrictions Precautions Precautions: Fall Restrictions Weight Bearing Restrictions: Yes LLE Weight Bearing: Weight bearing as tolerated      Mobility  Bed Mobility Overal bed mobility: Needs Assistance Bed Mobility: Supine to Sit;Sit to Supine     Supine to sit: Total assist;+2 for physical assistance;+2 for safety/equipment Sit to supine: Total assist;+2 for physical assistance;+2 for safety/equipment   General bed mobility comments: patient little initiation with multimodal cues.    Transfers Overall transfer level: Needs assistance Equipment used: Rolling Brennan Karam (2 wheeled) Transfers: Sit to/from  [facility] Sit to Stand: Max assist;+2 physical assistance;+2 safety/equipment Stand pivot transfers: Max assist;+2 physical assistance;+2 safety/equipment       General transfer comment: Cues required for hand placement with sit to stand transfer. Initialy stand pivot required maxA+2 to [facility] with RW, transfer back to bed totalA+2 due to patient unable to take steps and heavy posterior lean. Patient was able to take steps with increased effort to [facility] with heavy use of UEs, heavy assist, and multimodal cues  Ambulation/Gait             General Gait Details: unable  Stairs            Wheelchair Mobility    Modified Rankin (Stroke Patients Only)       Balance Overall balance assessment: Needs assistance Sitting-balance support: Bilateral upper extremity supported;Feet supported Sitting balance-Leahy Scale: Poor Sitting balance - Comments: initially requiring maxA to maintain sitting EOB, progressing to close supervision Postural control: Posterior lean Standing balance support: Bilateral upper extremity supported;During functional activity Standing balance-Leahy Scale: Poor Standing balance comment: reliant on UE support and external assist                             Pertinent Vitals/Pain Pain Assessment: Faces Faces Pain Scale: Hurts even more Pain Location: LLE Pain Descriptors / Indicators: Grimacing;Guarding Pain Intervention(s): Monitored during session;Repositioned    Home Living Family/patient expects to be discharged to:: Private residence Living Arrangements: Spouse/significant other Available Help at Discharge: Family;Available 24 hours/day Type of Home: House Home Access: Stairs to enter Entrance Stairs-Rails: Left Entrance Stairs-Number of Steps: 3 Home Layout: One level Home Equipment: Brithany Whitworth - 2 wheels;Shower seat - built in;Grab bars - tub/shower      Prior Function Level of Independence: Independent with  assistive  device(s)         Comments: uses RW for mobility     Hand Dominance   Dominant Hand: Right    Extremity/Trunk Assessment   Upper Extremity Assessment Upper Extremity Assessment: Defer to OT evaluation    Lower Extremity Assessment Lower Extremity Assessment: Generalized weakness       Communication   Communication: No difficulties  Cognition Arousal/Alertness: Awake/alert Behavior During Therapy: WFL for tasks assessed/performed Overall Cognitive Status: Impaired/Different from baseline Area of Impairment: Memory;Following commands;Problem solving                     Memory: Decreased short-term memory Following Commands: Follows one step commands inconsistently;Follows one step commands with increased time     Problem Solving: Difficulty sequencing;Requires verbal cues;Requires tactile cues General Comments: difficulty word finding throughout session. While sitting on BSC, patient asked "I need to get on the potty". A&Ox4      General Comments General comments (skin integrity, edema, etc.): Patient on 2L O2 [place] on arrival, spO2 94-96%. With mobility, spO2 dropped to as low as 92%    Exercises     Assessment/Plan    PT Assessment Patient needs continued PT services  PT Problem List Decreased strength;Decreased range of motion;Decreased activity tolerance;Decreased mobility;Decreased balance;Decreased cognition;Decreased knowledge of use of DME;Decreased safety awareness       PT Treatment Interventions DME instruction;Gait training;Therapeutic activities;Functional mobility training;Therapeutic exercise;Balance training;Patient/family education    PT Goals (Current goals can be found in the Care Plan section)  Acute Rehab PT Goals Patient Stated Goal: less pain PT Goal Formulation: With patient Time For Goal Achievement: 09/18/20 Potential to Achieve Goals: Fair    Frequency Min 3X/week   Barriers to discharge        Co-evaluation PT/OT/SLP  Co-Evaluation/Treatment: Yes Reason for Co-Treatment: For patient/therapist safety;To address functional/ADL transfers PT goals addressed during session: Mobility/safety with mobility;Proper use of DME;Balance         AM-PAC PT "6 Clicks" Mobility  Outcome Measure Help needed turning from your back to your side while in a flat bed without using bedrails?: Total Help needed moving from lying on your back to sitting on the side of a flat bed without using bedrails?: Total Help needed moving to and from a bed to a chair (including a wheelchair)?: Total Help needed standing up from a chair using your arms (e.g., wheelchair or bedside chair)?: A Lot Help needed to walk in hospital room?: A Lot Help needed climbing 3-5 steps with a railing? : A Lot 6 Click Score: 9    End of Session Equipment Utilized During Treatment: Gait belt;Oxygen Activity Tolerance: Patient limited by pain Patient left: in bed;with call bell/phone within reach;with bed alarm set Nurse Communication: Mobility status;Need for lift equipment PT Visit Diagnosis: Unsteadiness on feet (R26.81);Muscle weakness (generalized) (M62.81);History of falling (Z91.81);Difficulty in walking, not elsewhere classified (R26.2)    Time: 7902-4097 PT Time Calculation (min) (ACUTE ONLY): 39 min   Charges:   PT Evaluation $PT Eval Moderate Complexity: 1 Mod PT Treatments $Therapeutic Activity: 8-22 mins        Alaiah Lundy A. Gilford Rile PT, DPT Acute Rehabilitation Services Pager [phone] Office [phone]   Linna Hoff 09/04/2020, 12:51 PM

## 2020-09-04 NOTE — Progress Notes (Signed)
[facility] Progress Note  Patient ID: [name] MRN: [id number] DOB/AGE: [birth year] 87 y.o.  Subjective:  Reports L hip pain  Very tired  Denies CP or SOB Not hungry   ROS As above  Objective:   VITALS:   Vitals:   09/04/20 0100 09/04/20 0101 09/04/20 0408 09/04/20 0908  BP:   (!) 105/46 (!) 117/50  Pulse:   79 73  Resp:   16 17  Temp:   97.8 F (36.6 C) 98.3 F (36.8 C)  TempSrc:    Oral  SpO2: (!) 85% 94% 97% 99%  Weight:      Height:        Estimated body mass index is 22.17 kg/m as calculated from the following:   Height as of this encounter: 5\' 6"  (1.676 m).   Weight as of this encounter: 62.3 kg.   Intake/Output      02/10 0701 02/11 0700 02/11 0701 02/12 0700   I.V. (mL/kg) 2012.9 (32.3)    IV Piggyback 350    Total Intake(mL/kg) 2362.9 (37.9)    Urine (mL/kg/hr) 2600 (1.7)    Blood 200    Total Output 2800    Net -437.1           LABS  Results for orders placed or performed during the hospital encounter of 09/02/20 (from the past 24 hour(s))  CBC     Status: Abnormal   Collection Time: 09/04/20  1:31 AM  Result Value Ref Range   WBC 8.1 4.0 - 10.5 K/uL   RBC 2.85 (L) 3.87 - 5.11 MIL/uL   Hemoglobin 8.8 (L) 12.0 - 15.0 g/dL   HCT 27.5 (L) 36.0 - 46.0 %   MCV 96.5 80.0 - 100.0 fL   MCH 30.9 26.0 - 34.0 pg   MCHC 32.0 30.0 - 36.0 g/dL   RDW 13.9 11.5 - 15.5 %   Platelets 128 (L) 150 - 400 K/uL   nRBC 0.0 0.0 - 0.2 %  Basic metabolic panel     Status: Abnormal   Collection Time: 09/04/20  1:31 AM  Result Value Ref Range   Sodium 136 135 - 145 mmol/L   Potassium 3.5 3.5 - 5.1 mmol/L   Chloride 102 98 - 111 mmol/L   CO2 25 22 - 32 mmol/L   Glucose, Bld 128 (H) 70 - 99 mg/dL   BUN 12 8 - 23 mg/dL   Creatinine, Ser 0.61 0.44 - 1.00 mg/dL   Calcium 8.4 (L) 8.9 - 10.3 mg/dL   GFR, Estimated >60 >60 mL/min   Anion gap 9 5 - 15      PHYSICAL EXAM:   Gen: sitting up in bed, tired appearing, able to communicate  Lungs:unlabored  Cardiac: s1 and s2 but heart sounds are distant  Abd: soft, + BS Ext:       Left Lower Extremity   Dressings stable, strike through noted but not excessive  Ext warm   + DP pulse  No DCT  Compartments are soft  Distal motor and sensory functions intact   Assessment/Plan: 1 Day Post-Op   Principal Problem:   Displaced intertrochanteric fracture of left femur, initial encounter for closed fracture (HCC) Active Problems:   OA (osteoarthritis) of knee   Pure hypercholesterolemia   Essential hypertension, benign   PAT (  paroxysmal atrial tachycardia) (HCC)   Prolonged QT interval   Closed fracture of distal end of left femur, initial encounter ([place])   Anti-infectives (From admission, onward)   Start     Dose/Rate Route Frequency Ordered Stop   09/03/20 1415  ceFAZolin (ANCEF) IVPB 2g/100 mL premix        2 g 200 mL/hr over 30 Minutes Intravenous Every 6 hours 09/03/20 1325 09/04/20 0511   09/03/20 0800  ceFAZolin (ANCEF) IVPB 2g/100 mL premix        2 g 200 mL/hr over 30 Minutes Intravenous To [facility] 09/02/20 2354 09/03/20 0908    .  POD/HD#: 1  87 y/o female s/p fall with left hip fracture proximal to distal femur hardware   -fall  - L intertrochanteric hip fracture s/p IMN  WBAT with assistance/walker  Range of motion as tolerated with hip and knee  Dressing changes left leg starting on 09/05/2020   Okay to leave wounds open once completely dry   Clean wounds with soap and water only  PT and OT evaluations  Will likely need SNF  - Pain management:  Minimize narcotics  Multimodal  Scheduled Tylenol  - ABL anemia/Hemodynamics  Monitor  Check H&H this afternoon  - Medical issues   Per primary  - DVT/PE prophylaxis:  Resume home Eliquis  SCDs  - ID:   Perioperative antibiotics - Metabolic Bone Disease:  Vitamin D levels look fine however  patient has known osteoporosis given multiple fractures over the last 3 years from low-energy mechanisms  - Activity:  Up with assistance   - Dispo:  Continue with inpatient care  [facility] consult for SNF  Follow-up with orthopedics in 10 to 14 days.  Remove sutures around 09/17/2020   Jari Pigg, PA-C [phone] (C) 09/04/2020, 9:38 AM  [facility] [place] [id number] [phone] Jenetta [phone] (F)    After 5pm and on the weekends please log on to Amion, go to orthopaedics and the look under the [facility] Call for the provider(s) on call. You can also call our office at [phone] and then follow the prompts to be connected to the call team.

## 2020-09-04 NOTE — Progress Notes (Signed)
  Echocardiogram 2D Echocardiogram has been performed.  [name] 09/04/2020, 8:52 AM

## 2020-09-04 NOTE — Progress Notes (Signed)
Received in report pt with one episode of bladder scan/ straight cath completed   Repeated bladder scan #2 with 990 mL noted, straight cath performed and 1300 mL removed.   Repeated bladder scan #3 with 237 mL noted.   Pt denied need to void throughout shift. Will report to on coming shift to continue to monitor

## 2020-09-04 NOTE — Evaluation (Signed)
Occupational Therapy Evaluation Patient Details Name: [name] MRN: [id number] DOB: 11-14-1931 Today's Date: 09/04/2020    History of Present Illness 87 y.o. female with a past medical history significant for breast cancer, DVT, skin cancer, hypertension, hypercholesterolemia, osteoarthritis, and multiple leg fractures in the past who presents with a fall and left hip injury. Patient sustained L hip fx. Now s/p IM nail of L hip and removal of deep implant L femur from previous fx on 2/10.   Clinical Impression   Pt presents with decline in function and safety with ADLs and ADL mobility with impaired strength, balance, endurance and cognition. Pt also limited by pain in L LE. Pt with ifficulty word finding throughout session. While sitting on BSC, patient asked "I need to get on the potty". Pt lives at home with her husband and was Ind with ADLs/selfcare and used RW for mobility. Pt currently requires total A + for sitting EOB, max - total A +2 for transfers, mod A with UB ADLs and total A with LB ADLs/selfcare and toileting. Pt would benefit from acute OT services to address impairments to maximize level of function and safety    Follow Up Recommendations  SNF    Equipment Recommendations  3 in 1 bedside commode;Other (comment) (RW, TBD at next level of care)    Recommendations for Other Services       Precautions / Restrictions Precautions Precautions: Fall Restrictions Weight Bearing Restrictions: Yes RLE Weight Bearing: Weight bearing as tolerated LLE Weight Bearing: Weight bearing as tolerated      Mobility Bed Mobility Overal bed mobility: Needs Assistance Bed Mobility: Supine to Sit;Sit to Supine     Supine to sit: Total assist;+2 for physical assistance;+2 for safety/equipment Sit to supine: Total assist;+2 for physical assistance;+2 for safety/equipment   General bed mobility comments: patient little initiation with multimodal cues.    Transfers Overall  transfer level: Needs assistance Equipment used: Rolling walker (2 wheeled) Transfers: Sit to/from [facility] Sit to Stand: Max assist;+2 physical assistance;+2 safety/equipment Stand pivot transfers: Max assist;+2 physical assistance;+2 safety/equipment       General transfer comment: Cues required for hand placement with sit to stand transfer. Initialy stand pivot required maxA+2 to [facility] with RW, transfer back to bed totalA+2 due to patient unable to take steps and heavy posterior lean. Patient was able to take steps with increased effort to [facility] with heavy use of UEs, heavy assist, and multimodal cues    Balance Overall balance assessment: Needs assistance Sitting-balance support: Bilateral upper extremity supported;Feet supported Sitting balance-Leahy Scale: Poor Sitting balance - Comments: initially requiring maxA to maintain sitting EOB, progressing to min guard A Postural control: Posterior lean Standing balance support: Bilateral upper extremity supported;During functional activity Standing balance-Leahy Scale: Poor Standing balance comment: reliant on UE support and external assist                           ADL either performed or assessed with clinical judgement   ADL Overall ADL's : Needs assistance/impaired Eating/Feeding: Set up;Sitting;Bed level   Grooming: Wash/dry hands;Wash/dry face;Min guard;Sitting   Upper Body Bathing: Moderate assistance;Sitting   Lower Body Bathing: Total assistance   Upper Body Dressing : Moderate assistance;Sitting   Lower Body Dressing: Total assistance   Toilet Transfer: Maximal assistance;Total assistance;+2 for physical assistance;+2 for safety/equipment;Stand-pivot;RW;Cueing for safety;Cueing for sequencing   Toileting- Clothing Manipulation and Hygiene: Total assistance       Functional mobility  during ADLs: Maximal assistance;Total assistance;+2 for physical assistance;+2 for safety/equipment;Rolling  walker;Cueing for safety;Cueing for sequencing General ADL Comments: While sitting on BSC, patient asked "I need to get on the potty". A&Ox4     Vision Baseline Vision/History: Wears glasses Wears Glasses: Reading only Patient Visual Report: No change from baseline       Perception     Praxis      Pertinent Vitals/Pain Pain Assessment: Faces Faces Pain Scale: Hurts even more Pain Location: LLE Pain Descriptors / Indicators: Grimacing;Guarding;Moaning Pain Intervention(s): Limited activity within patient's tolerance;Monitored during session;Repositioned     Hand Dominance Right   Extremity/Trunk Assessment Upper Extremity Assessment Upper Extremity Assessment: Generalized weakness   Lower Extremity Assessment Lower Extremity Assessment: Defer to PT evaluation   Cervical / Trunk Assessment Cervical / Trunk Assessment: Kyphotic   Communication Communication Communication: No difficulties   Cognition Arousal/Alertness: Awake/alert Behavior During Therapy: WFL for tasks assessed/performed Overall Cognitive Status: Impaired/Different from baseline Area of Impairment: Memory;Following commands;Problem solving                     Memory: Decreased short-term memory Following Commands: Follows one step commands inconsistently;Follows one step commands with increased time     Problem Solving: Difficulty sequencing;Requires verbal cues;Requires tactile cues General Comments: difficulty word finding throughout session. While sitting on BSC, patient asked "I need to get on the potty". A&Ox4   General Comments  Patient on 2L O2  on arrival, spO2 94-96%. With mobility, spO2 dropped to as low as 92%    Exercises     Shoulder Instructions      Home Living Family/patient expects to be discharged to:: Private residence Living Arrangements: Spouse/significant other Available Help at Discharge: Family;Available 24 hours/day Type of Home: House Home Access: Stairs to  enter [employer] of Steps: 3 Entrance Stairs-Rails: Left Home Layout: One level     Bathroom Shower/Tub: Occupational psychologist: Handicapped height     Home Equipment: Environmental consultant - 2 wheels;Shower seat - built in;Grab bars - tub/shower          Prior Functioning/Environment Level of Independence: Independent with assistive device(s)        Comments: was Ind with ADLs/selfcare, uses RW for mobility        OT Problem List: Decreased strength;Impaired balance (sitting and/or standing);Decreased cognition;Pain;Decreased safety awareness;Decreased activity tolerance;Decreased coordination;Decreased knowledge of use of DME or AE      OT Treatment/Interventions: Self-care/ADL training;Therapeutic exercise;Patient/family education;Balance training;Therapeutic activities;DME and/or AE instruction    OT Goals(Current goals can be found in the care plan section) Acute Rehab OT Goals Patient Stated Goal: less pain OT Goal Formulation: With patient Time For Goal Achievement: 09/18/20 Potential to Achieve Goals: Good ADL Goals Pt Will Perform Grooming: with supervision;with set-up;sitting Pt Will Perform Upper Body Bathing: with min assist;sitting Pt Will Perform Upper Body Dressing: with min assist;sitting Pt Will Transfer to Toilet: with max assist;stand pivot transfer;bedside commode  OT Frequency: Min 2X/week   Barriers to D/C:            Co-evaluation PT/OT/SLP Co-Evaluation/Treatment: Yes Reason for Co-Treatment: For patient/therapist safety;To address functional/ADL transfers PT goals addressed during session: Mobility/safety with mobility;Proper use of DME;Balance OT goals addressed during session: ADL's and self-care;Proper use of Adaptive equipment and DME      AM-PAC OT "6 Clicks" Daily Activity     Outcome Measure Help from another person eating meals?: A Little Help from another person taking care of personal  grooming?: A Little Help from  another person toileting, which includes using toliet, bedpan, or urinal?: Total Help from another person bathing (including washing, rinsing, drying)?: Total Help from another person to put on and taking off regular upper body clothing?: A Lot Help from another person to put on and taking off regular lower body clothing?: Total 6 Click Score: 11   End of Session Equipment Utilized During Treatment: Gait belt;Rolling walker;Other (comment) [facility]) Nurse Communication: Mobility status  Activity Tolerance: Patient limited by pain;Patient limited by fatigue Patient left: in bed;with call bell/phone within reach;with bed alarm set  OT Visit Diagnosis: Unsteadiness on feet (R26.81);Other abnormalities of gait and mobility (R26.89);Muscle weakness (generalized) (M62.81);History of falling (Z91.81);Other symptoms and signs involving cognitive function;Pain Pain - Right/Left: Left Pain - part of body: Hip;Leg                Time: 1011-1051 OT Time Calculation (min): 40 min Charges:  OT General Charges $OT Visit: 1 Visit OT Evaluation $OT Eval Moderate Complexity: 1 Mod    Britt Bottom 09/04/2020, 2:13 PM

## 2020-09-04 NOTE — Progress Notes (Addendum)
[facility] Progress Note  Patient: [name]    MVH:[id number]  DOA: 09/02/2020     Date of Service: the patient was seen and examined on 09/04/2020  Brief hospital course: Past medical history of breast cancer, PE DVT, depression, melanoma and squamous cell cancer, chronic systolic CHF, EF 84%, CAD managed medically, recurrent falls with previous left hip fracture. Patient presented with a fall and left hip pain. Found to have left intertrochanteric fracture again. Cardiology was consulted by ER Due to abnormal EKG recommended no further work-up. SP intramedullary nailing on 2/10. Currently plan is monitor postoperative recovery.  Assessment and Plan: 1. Displaced left intertrochanteric hip fracture Mechanical fall SP intramedullary nailing of the left hip as well as removal of the prior left femur implant.  2/10 Patient was in the bathroom, turned around and and slipped and had a fall. Denies having any head injury. Found to have left hip fracture. Orthopedic have been consulted. Surgery recommended. Patient remains at moderate to high risk given her cardiac history but not a candidate for intervention. D/w Husband and was agreeable to the risk involved.  We will monitor postop recovery.  2. Preoperative medical evaluation No Coronary revascularization/CVA within 5 years. Had Recent stress test in 10/2019, which was positive. Pt was deemed high risk for cardiac intervention back then and plan was for medical management based on prior Cardiology notes. Stress test was done as part of pre-op evaluation in the setting of a hip fracture and low EF on Echocardiogram. Pt tolerated the surgery well in 2021.  At risk for bleeding postop, for urinary retention, volume overload and postop hypotension as well.  3.  Chronic systolic CHF Potential Takotsubo syndrome History of CAD HLD Patient has history of EF of 40 to 45% on echo in April 2021. Stress test with reversible  ischemia in April 2021 as well. Managed medically. Echocardiogram continues to show 45% EF without any new changes. Continue ARB as much as she can tolerate.  For now we will hold due to soft blood pressure. Continue statin  4.  History of PE DVT Varicose vein Bilateral saphenectomy history Acute postop blood loss anemia. Currently on anticoagulation with Eliquis. Postop will require continued anticoagulation given her increased risk. Currently no active bleeding but hemoglobin dropped from 11's to 8's.  Currently stable.  Monitor  5.  Essential hypertension Currently on losartan. As needed diuretic. Holding both of them for now.  6.  PVC/NSVT/junctional tachycardia Unable to tolerate beta-blockers. Monitor.  Diet: Cardiac diet DVT Prophylaxis: Therapeutic Anticoagulation with apixaban  SCDs Start: 09/03/20 1326 SCDs Start: 09/02/20 2146 apixaban (ELIQUIS) tablet 5 mg    Advance goals of care discussion: Full code  Family Communication: no family was present at bedside, at the time of interview.   Disposition:  Status is: Inpatient  Remains inpatient appropriate because: Need for close monitoring for postop blood loss anemia and stability   Dispo:  Patient From: Home  Planned Disposition: [place]  Expected discharge date: 09/06/2020  Medically stable for discharge: No    Subjective: No nausea no vomiting.  No fever no chills no chest pain.  Pain well controlled.  Physical Exam:  General: Appear in mild distress, no Rash; Oral Mucosa Clear, moist. no Abnormal Neck Mass Or lumps, Conjunctiva normal  Cardiovascular: S1 and S2 Present, aortic systolic Murmur, Respiratory: good respiratory effort, Bilateral Air entry present and CTA, no Crackles, no wheezes Abdomen: Bowel Sound present, Soft and no tenderness Extremities: no Pedal  edema Neurology: alert and oriented to time, place, and person affect appropriate. no new focal deficit Gait not checked  due to patient safety concerns  Vitals:   09/04/20 0101 09/04/20 0408 09/04/20 0908 09/04/20 1510  BP:  (!) 105/46 (!) 117/50 (!) 118/49  Pulse:  79 73 83  Resp:  16 17 17   Temp:  97.8 F (36.6 C) 98.3 F (36.8 C) 98.3 F (36.8 C)  TempSrc:   Oral Oral  SpO2: 94% 97% 99% 100%  Weight:      Height:        Intake/Output Summary (Last 24 hours) at 09/04/2020 1543 Last data filed at 09/04/2020 1128 Gross per 24 hour  Intake --  Output 3200 ml  Net -3200 ml   Filed Weights   09/02/20 2010  Weight: 62.3 kg    Data Reviewed: I have personally reviewed and interpreted daily labs, tele strips, imaging. I reviewed all nursing notes, pharmacy notes, vitals, pertinent old records I have discussed plan of care as described above with RN and patient/family.  CBC: Recent Labs  Lab 09/02/20 2124 09/03/20 0405 09/04/20 0131 09/04/20 1233  WBC 10.3 8.1 8.1  --   NEUTROABS 8.7*  --   --   --   HGB 11.8* 11.2* 8.8* 8.5*  HCT 37.1 34.9* 27.5* 26.6*  MCV 95.4 95.1 96.5  --   PLT 151 151 128*  --    Basic Metabolic Panel: Recent Labs  Lab 09/02/20 2124 09/03/20 0405 09/04/20 0131  NA 138 137 136  K 3.5 3.5 3.5  CL 101 102 102  CO2 24 27 25   GLUCOSE 108* 219* 128*  BUN 25* 20 12  CREATININE 0.67 0.62 0.61  CALCIUM 9.3 8.9 8.4*    Studies: ECHOCARDIOGRAM COMPLETE  Result Date: 09/04/2020    ECHOCARDIOGRAM REPORT   Patient Name:   [name] Date of Exam: 09/04/2020 Medical Rec #:  [id number]            Height:       66.0 in Accession #:    [id number]           Weight:       137.3 lb Date of Birth:  [birth year]           BSA:          1.705 m Patient Age:    87 years             BP:           105/46 mmHg Patient Gender: F                    HR:           79 bpm. Exam Location:  Inpatient Procedure: 2D Echo, Cardiac Doppler and Color Doppler                                 MODIFIED REPORT:   This report was modified by Lyman Bishop MD on 09/04/2020 due to Updated RV                                    information.  Indications:     CHF  History:         Patient has prior history of Echocardiogram examinations, most  recent 11/02/2019. CHF, CAD; Risk Factors:Hypertension. H/O DVT.  Sonographer:     Clayton Lefort RDCS (AE) Referring Phys:  [place] Diagnosing Phys: Lyman Bishop MD IMPRESSIONS  1. Left ventricular ejection fraction, by estimation, is 45 to 50%. The left ventricle has mildly decreased function. The left ventricle demonstrates regional wall motion abnormalities (see scoring diagram/findings for description). There is severe asymmetric left ventricular hypertrophy of the basal-septal segment. Left ventricular diastolic parameters are consistent with Grade I diastolic dysfunction (impaired relaxation). There is moderate hypokinesis of the left ventricular, basal-mid inferolateral wall.  2. Right ventricular systolic function is mildly reduced. The right ventricular size is mildly enlarged. There is moderately elevated pulmonary artery systolic pressure. The estimated right ventricular systolic pressure is 29.9 mmHg.  3. Left atrial size was severely dilated.  4. The pericardial effusion is posterior to the left ventricle.  5. The mitral valve is abnormal. Mild mitral valve regurgitation.  6. Tricuspid valve regurgitation is mild to moderate.  7. The aortic valve was not well visualized. Aortic valve regurgitation is mild. Mild aortic valve stenosis. Aortic regurgitation PHT measures 591 msec. Aortic valve area, by VTI measures 2.06 cm. Aortic valve mean gradient measures 7.0 mmHg. Aortic valve Vmax measures 1.82 m/s.  8. Aortic dilatation noted. There is borderline dilatation of the ascending aorta, measuring 39 mm.  9. The inferior vena cava is dilated in size with <50% respiratory variability, suggesting right atrial pressure of 15 mmHg. Comparison(s): Changes from prior study are noted. 11/02/2019: LVEF 40-45%, global hypokinesis, RVSP 43 mmHg,  moderate RV dysfunction. FINDINGS  Left Ventricle: Left ventricular ejection fraction, by estimation, is 45 to 50%. The left ventricle has mildly decreased function. The left ventricle demonstrates regional wall motion abnormalities. Moderate hypokinesis of the left ventricular, basal-mid inferolateral wall. The left ventricular internal cavity size was normal in size. There is severe asymmetric left ventricular hypertrophy of the basal-septal segment. Left ventricular diastolic parameters are consistent with Grade I diastolic dysfunction (impaired relaxation). Indeterminate filling pressures. Right Ventricle: The right ventricular size is mildly enlarged. No increase in right ventricular wall thickness. Right ventricular systolic function is mildly reduced. There is moderately elevated pulmonary artery systolic pressure. The tricuspid regurgitant velocity is 2.96 m/s, and with an assumed right atrial pressure of 15 mmHg, the estimated right ventricular systolic pressure is 24.2 mmHg. Left Atrium: Left atrial size was severely dilated. Right Atrium: Right atrial size was normal in size. Pericardium: Trivial pericardial effusion is present. The pericardial effusion is posterior to the left ventricle. Mitral Valve: The mitral valve is abnormal. There is moderate thickening of the mitral valve leaflet(s). There is moderate calcification of the mitral valve leaflet(s). Mild mitral valve regurgitation. MV peak gradient, 9.9 mmHg. The mean mitral valve gradient is 3.0 mmHg. Tricuspid Valve: The tricuspid valve is grossly normal. Tricuspid valve regurgitation is mild to moderate. Aortic Valve: The aortic valve was not well visualized. Aortic valve regurgitation is mild. Aortic regurgitation PHT measures 591 msec. Mild aortic stenosis is present. Aortic valve mean gradient measures 7.0 mmHg. Aortic valve peak gradient measures 13.2 mmHg. Aortic valve area, by VTI measures 2.06 cm. Pulmonic Valve: The pulmonic valve was  grossly normal. Pulmonic valve regurgitation is not visualized. Aorta: Aortic dilatation noted. There is borderline dilatation of the ascending aorta, measuring 39 mm. Venous: The inferior vena cava is dilated in size with less than 50% respiratory variability, suggesting right atrial pressure of 15 mmHg. IAS/Shunts: No atrial level shunt detected by color flow Doppler.  LEFT VENTRICLE PLAX 2D LVIDd:         4.30 cm  Diastology LVIDs:         3.30 cm  LV e' medial:    5.66 cm/s LV PW:         1.40 cm  LV E/e' medial:  14.7 LV IVS:        1.80 cm  LV e' lateral:   10.90 cm/s LVOT diam:     2.00 cm  LV E/e' lateral: 7.6 LV SV:         80 LV SV Index:   47 LVOT Area:     3.14 cm  RIGHT VENTRICLE          IVC RV Basal diam:  3.40 cm  IVC diam: 2.30 cm LEFT ATRIUM             Index       RIGHT ATRIUM           Index LA diam:        3.50 cm 2.05 cm/m  RA Area:     18.10 cm LA Vol (A2C):   70.3 ml 41.24 ml/m RA Volume:   42.60 ml  24.99 ml/m LA Vol (A4C):   87.6 ml 51.39 ml/m LA Biplane Vol: 81.7 ml 47.93 ml/m  AORTIC VALVE AV Area (Vmax):    2.19 cm AV Area (Vmean):   1.87 cm AV Area (VTI):     2.06 cm AV Vmax:           182.00 cm/s AV Vmean:          122.000 cm/s AV VTI:            0.389 m AV Peak Grad:      13.2 mmHg AV Mean Grad:      7.0 mmHg LVOT Vmax:         127.00 cm/s LVOT Vmean:        72.800 cm/s LVOT VTI:          0.255 m LVOT/AV VTI ratio: 0.66 AI PHT:            591 msec  AORTA Ao Root diam: 3.20 cm Ao Asc diam:  3.80 cm MITRAL VALVE                TRICUSPID VALVE MV Area (PHT): 3.72 cm     TR Peak grad:   35.0 mmHg MV Area VTI:   2.38 cm     TR Vmax:        296.00 cm/s MV Peak grad:  9.9 mmHg MV Mean grad:  3.0 mmHg     SHUNTS MV Vmax:       1.57 m/s     Systemic VTI:  0.26 m MV Vmean:      79.8 cm/s    Systemic Diam: 2.00 cm MV Decel Time: 204 msec MV E velocity: 83.20 cm/s MV A velocity: 114.00 cm/s MV E/A ratio:  0.73 Lyman Bishop MD Electronically signed by Lyman Bishop MD Signature  Date/Time: 09/04/2020/10:47:28 AM    Final (Updated)     Scheduled Meds: . acetaminophen  650 mg Oral Q8H  . apixaban  5 mg Oral BID  . docusate sodium  100 mg Oral BID  . mupirocin ointment  1 application Nasal BID   Continuous Infusions:  PRN Meds: HYDROcodone-acetaminophen, menthol-cetylpyridinium **OR** phenol, morphine injection, ondansetron (ZOFRAN) IV, ondansetron **OR** ondansetron (ZOFRAN) IV  Time spent: 35 minutes  Author: Berle Mull,  MD [facility] 09/04/2020 3:43 PM  To reach On-call, see care teams to locate the attending and reach out via [email or web address]. Between 7PM-7AM, please contact night-coverage If you still have difficulty reaching the attending provider, please page the [facility] (Director on Call) for [facility] on amion for assistance.

## 2020-09-05 ENCOUNTER — Inpatient Hospital Stay (HOSPITAL_COMMUNITY): Payer: Medicare Other

## 2020-09-05 LAB — CBC
HCT: 24.4 % — ABNORMAL LOW (ref 36.0–46.0)
Hemoglobin: 8.1 g/dL — ABNORMAL LOW (ref 12.0–15.0)
MCH: 31.3 pg (ref 26.0–34.0)
MCHC: 33.2 g/dL (ref 30.0–36.0)
MCV: 94.2 fL (ref 80.0–100.0)
Platelets: 114 10*3/uL — ABNORMAL LOW (ref 150–400)
RBC: 2.59 MIL/uL — ABNORMAL LOW (ref 3.87–5.11)
RDW: 14.2 % (ref 11.5–15.5)
WBC: 8.1 10*3/uL (ref 4.0–10.5)
nRBC: 0 % (ref 0.0–0.2)

## 2020-09-05 LAB — BASIC METABOLIC PANEL
Anion gap: 8 (ref 5–15)
BUN: 12 mg/dL (ref 8–23)
CO2: 27 mmol/L (ref 22–32)
Calcium: 8.6 mg/dL — ABNORMAL LOW (ref 8.9–10.3)
Chloride: 102 mmol/L (ref 98–111)
Creatinine, Ser: 0.58 mg/dL (ref 0.44–1.00)
GFR, Estimated: >60 mL/min (ref >60)
Glucose, Bld: 115 mg/dL — ABNORMAL HIGH (ref 70–99)
Potassium: 3.6 mmol/L (ref 3.5–5.1)
Sodium: 137 mmol/L (ref 135–145)

## 2020-09-05 MED ORDER — METRONIDAZOLE 500 MG PO TABS
500.0000 mg | ORAL_TABLET | Freq: Three times a day (TID) | ORAL | Status: AC
  Administered 2020-09-05 – 2020-09-09 (×12): 500 mg via ORAL
  Filled 2020-09-05 (×12): qty 1

## 2020-09-05 MED ORDER — CEFDINIR 300 MG PO CAPS
300.0000 mg | ORAL_CAPSULE | Freq: Two times a day (BID) | ORAL | Status: AC
  Administered 2020-09-05 – 2020-09-09 (×10): 300 mg via ORAL
  Filled 2020-09-05 (×10): qty 1

## 2020-09-05 MED ORDER — LACTULOSE 10 GM/15ML PO SOLN
20.0000 g | Freq: Two times a day (BID) | ORAL | Status: DC
  Administered 2020-09-05 – 2020-09-09 (×9): 20 g via ORAL
  Filled 2020-09-05 (×14): qty 30

## 2020-09-05 MED ORDER — SENNOSIDES-DOCUSATE SODIUM 8.6-50 MG PO TABS
2.0000 | ORAL_TABLET | Freq: Two times a day (BID) | ORAL | Status: DC
  Administered 2020-09-05 – 2020-09-11 (×9): 2 via ORAL
  Filled 2020-09-05 (×12): qty 2

## 2020-09-05 NOTE — TOC Initial Note (Addendum)
Transition of Care [facility]) - Initial/Assessment Note    Patient Details  Name: [name] MRN: [id number] Date of Birth: 1931/10/18  Transition of Care [facility]) CM/SW Contact:    Gabrielle Dare Phone Number: 09/05/2020, 11:38 AM  Clinical Narrative:                 CSW spoke with pt at bedside and introduce self and role.  Pt was very pleasant.  Pt lives at home with spouse.  Pt explained that she fell on the floor in her bathroom on her left side.  Pt is agreeable for possible SNF placement. Pt is fully vaccinated and has received booster.  Pt received Pfizer booster in nov. 21 Pt also gave verbal permission for CSW to send pt out in hub for SNF's in [place], [place].  CSW left medicare.gov list at bedside for pt's spouse to review.  CSW reached out to pt's spouse and daughter.  Pt's daughter was given website information for medicare.gov to review.  Pt's spouse returned phone call.  Information was given to him concerning medicare.gov list left at bedside for review.  TOC team will continue to assist with disposition planning.  Expected Discharge Plan: Skilled Nursing Facility Barriers to Discharge: Continued Medical Work up,SNF Pending bed offer,Insurance Authorization   Patient Goals and CMS Choice Patient states their goals for this hospitalization and ongoing recovery are:: "to be able to get out of bed" CMS Medicare.gov Compare Post Acute Care list provided to:: Patient Choice offered to / list presented to : [place]  Expected Discharge Plan and Services Expected Discharge Plan: [place] In-house Referral: Clinical Social Work     Living arrangements for the past 2 months: [place]                                      Prior Living Arrangements/Services Living arrangements for the past 2 months: Single Family Home Lives with:: Spouse Patient language and need for interpreter reviewed:: Yes Do you feel safe  going back to the place where you live?: No      Need for Family Participation in Patient Care: Yes (Comment) Care giver support system in place?: No (comment)   Criminal Activity/Legal Involvement Pertinent to Current Situation/Hospitalization: No - Comment as needed  Activities of Daily Living      Permission Sought/Granted Permission sought to share information with : Case Manager,Facility Contact Representative,Family Supports Permission granted to share information with : Yes, Verbal Permission Granted  Share Information with NAME: Duana Benedict, spouse  Permission granted to share info w AGENCY: yes  Permission granted to share info w Relationship: spouse and daughter  Permission granted to share info w Contact Information: yes  Emotional Assessment Appearance:: Appears stated age Attitude/Demeanor/Rapport: Engaged,Gracious Affect (typically observed): Appropriate Orientation: : Oriented to Self,Oriented to Place Alcohol / Substance Use: Not Applicable Psych Involvement: No (comment)  Admission diagnosis:  Hip fracture ([place]) [S72.009A] Fall [W19.XXXA] Closed fracture of left hip, initial encounter ([place]) [S72.002A] Displaced intertrochanteric fracture of left femur, initial encounter for closed fracture [facility]) [S72.142A] Patient Active Problem List   Diagnosis Date Noted  . Displaced intertrochanteric fracture of left femur, initial encounter for closed fracture ([place]) 09/02/2020  . Non-ST elevation (NSTEMI) myocardial infarction ([place])   . Closed fracture of distal end of left femur, initial encounter ([place]) 11/01/2019  . Other fracture of left femur, initial  encounter for closed fracture ([place]) 11/01/2019  . Hip fracture ([place]) 11/01/2019  . Postprocedural hypotension   . NSVT (nonsustained ventricular tachycardia) ([place]) 09/26/2018  . Bradycardia   . Displaced supracondylar fracture of distal end of right femur without intracondylar extension ([place]) 05/04/2018  . Postop check    . Pain   . Femur fracture, right ([place]) 05/02/2018  . Prolonged QT interval 05/02/2018  . Bigeminy 05/02/2018  . Left ventricular diastolic dysfunction with preserved systolic function 03/83/3383  . PAT (paroxysmal atrial tachycardia) ([place]) 03/24/2016  . Diastolic dysfunction without heart failure 03/24/2016  . Peripheral venous insufficiency 03/24/2016  . Closed wedge compression fracture of fourth lumbar vertebra ([place])   . Near syncope 10/20/2014  . Orthostatic hypotension 10/20/2014  . Fatigue 10/19/2014  . Syncope 10/19/2014  . Generalized weakness   . Elevated troponin   . Dyspnea on exertion   . Rib fracture 09/05/2014  . HX: breast cancer 09/26/2013  . Depression 11/20/2012  . Unspecified constipation 11/20/2012  . Pure hypercholesterolemia 11/20/2012  . Essential hypertension, benign 11/20/2012  . Postoperative anemia due to acute blood loss 11/01/2012  . Hyponatremia 11/01/2012  . Hypokalemia 11/01/2012  . OA (osteoarthritis) of knee 10/29/2012  . Septic joint of right knee joint ([place]) 05/23/2012  . Elevated liver enzymes 05/23/2012   PCP:  Burnard Bunting, MD Pharmacy:   [facility] - [place], [place] [address] Phone: [phone] Fax: [phone]  [id number] [place], [place] [address] Phone: [phone] Fax: [phone]     Social Determinants of Health ([place]) Interventions    Readmission Risk Interventions Readmission Risk Prevention Plan 11/04/2019  Transportation Screening Complete  PCP or Specialist Appt within 5-7 Days Complete  Home Care Screening Complete  Medication Review (RN CM) Complete  Some recent data might be hidden

## 2020-09-05 NOTE — Progress Notes (Signed)
Subjective: 2 Days Post-Op Procedure(s) (LRB): HARDWARE REMOVAL (Left) INTRAMEDULLARY (IM) NAIL INTERTROCHANTRIC (Left) Patient reports pain as mild.    Objective: Vital signs in last 24 hours: Temp:  [97.5 F (36.4 C)-98.8 F (37.1 C)] 97.5 F (36.4 C) (02/12 0800) Pulse Rate:  [70-83] 70 (02/12 0800) Resp:  [15-17] 16 (02/12 0800) BP: (100-118)/(45-53) 100/45 (02/12 0800) SpO2:  [95 %-100 %] 95 % (02/12 0800)  Intake/Output from previous day: 02/11 0701 - 02/12 0700 In: -  Out: 1250 [Urine:1250] Intake/Output this shift: No intake/output data recorded.  Recent Labs    09/02/20 2124 09/03/20 0405 09/04/20 0131 09/04/20 1233 09/05/20 0155  HGB 11.8* 11.2* 8.8* 8.5* 8.1*   Recent Labs    09/04/20 0131 09/04/20 1233 09/05/20 0155  WBC 8.1  --  8.1  RBC 2.85*  --  2.59*  HCT 27.5* 26.6* 24.4*  PLT 128*  --  114*   Recent Labs    09/04/20 0131 09/05/20 0155  NA 136 137  K 3.5 3.6  CL 102 102  CO2 25 27  BUN 12 12  CREATININE 0.61 0.58  GLUCOSE 128* 115*  CALCIUM 8.4* 8.6*   Recent Labs    09/02/20 2124 09/03/20 0405  INR 1.2 1.2    Neurovascular intact Sensation intact distally Intact pulses distally Dorsiflexion/Plantar flexion intact Incision: dressing C/D/I   Assessment/Plan: 2 Days Post-Op Procedure(s) (LRB): HARDWARE REMOVAL (Left) INTRAMEDULLARY (IM) NAIL INTERTROCHANTRIC (Left)  87 y/o female s/p fall with left hip fracture proximal to distal femur hardware   -fall  - L intertrochanteric hip fracture s/p IMN             WBAT with assistance/walker             Range of motion as tolerated with hip and knee             Dressing changes left leg starting on 09/05/2020                         Okay to leave wounds open once completely dry                         Clean wounds with soap and water only             PT and OT evaluations             Will likely need SNF  - Pain management:             Minimize narcotics              Multimodal             Scheduled Tylenol  - ABL anemia/Hemodynamics             Monitor             Check H&H this afternoon  - Medical issues              Per primary  - DVT/PE prophylaxis:             Resume home Eliquis             SCDs  - ID:              Perioperative antibiotics - Metabolic Bone Disease:             Vitamin D levels look fine however patient has known osteoporosis given multiple fractures  over the last 3 years from low-energy mechanisms  - Activity:             Up with assistance   - Dispo:             Continue with inpatient care             [facility] consult for SNF             Follow-up with orthopedics in 10 to 14 days.  Remove sutures around 09/17/2020   Chriss Czar 09/05/2020, 11:55 AM

## 2020-09-05 NOTE — NC FL2 (Signed)
[place] LEVEL OF CARE SCREENING TOOL     IDENTIFICATION  Patient Name: [name] Birthdate: June 17, 1932 Sex: female Admission Date (Current Location): 09/02/2020  [facility] and [place] Number:  Herbalist and Address:  [facility], [place] [address] [place], [place] [address]      Provider Number: [id number]  Attending Physician Name and Address:  Lavina Hamman, MD  Relative Name and Phone Number:       Current Level of Care: Hospital Recommended Level of Care: [place] Prior Approval Number:    Date Approved/Denied:   PASRR Number: [id number]  Discharge Plan: SNF    Current Diagnoses: Patient Active Problem List   Diagnosis Date Noted  . Displaced intertrochanteric fracture of left femur, initial encounter for closed fracture ([place]) 09/02/2020  . Non-ST elevation (NSTEMI) myocardial infarction ([place])   . Closed fracture of distal end of left femur, initial encounter ([place]) 11/01/2019  . Other fracture of left femur, initial encounter for closed fracture ([place]) 11/01/2019  . Hip fracture ([place]) 11/01/2019  . Postprocedural hypotension   . NSVT (nonsustained ventricular tachycardia) ([place]) 09/26/2018  . Bradycardia   . Displaced supracondylar fracture of distal end of right femur without intracondylar extension ([place]) 05/04/2018  . Postop check   . Pain   . Femur fracture, right ([place]) 05/02/2018  . Prolonged QT interval 05/02/2018  . Bigeminy 05/02/2018  . Left ventricular diastolic dysfunction with preserved systolic function 88/41/6606  . PAT (paroxysmal atrial tachycardia) ([place]) 03/24/2016  . Diastolic dysfunction without heart failure 03/24/2016  . Peripheral venous insufficiency 03/24/2016  . Closed wedge compression fracture of fourth lumbar vertebra ([place])   . Near syncope 10/20/2014  . Orthostatic hypotension 10/20/2014  . Fatigue 10/19/2014  . Syncope 10/19/2014  . Generalized weakness    . Elevated troponin   . Dyspnea on exertion   . Rib fracture 09/05/2014  . HX: breast cancer 09/26/2013  . Depression 11/20/2012  . Unspecified constipation 11/20/2012  . Pure hypercholesterolemia 11/20/2012  . Essential hypertension, benign 11/20/2012  . Postoperative anemia due to acute blood loss 11/01/2012  . Hyponatremia 11/01/2012  . Hypokalemia 11/01/2012  . OA (osteoarthritis) of knee 10/29/2012  . Septic joint of right knee joint ([place]) 05/23/2012  . Elevated liver enzymes 05/23/2012    Orientation RESPIRATION BLADDER Height & Weight     Self,Place  O2 Incontinent,External catheter Weight: 137 lb 5.6 oz (62.3 kg) Height:  5\' 6"  (167.6 cm)  BEHAVIORAL SYMPTOMS/MOOD NEUROLOGICAL BOWEL NUTRITION STATUS      Continent Diet (please see discharge summary)  AMBULATORY STATUS COMMUNICATION OF NEEDS Skin     Verbally  (closed incision Left Leg)                       Personal Care Assistance Level of Assistance  Bathing,Feeding,Dressing Bathing Assistance: Limited assistance Feeding assistance: Independent Dressing Assistance: Limited assistance     Functional Limitations Info  Sight,Hearing,Speech Sight Info: Adequate Hearing Info: Adequate Speech Info: Adequate    SPECIAL CARE FACTORS FREQUENCY  PT (By licensed PT),OT (By licensed OT)     PT Frequency: 5x per week OT Frequency: 5x per week            Contractures Contractures Info: Not present    Additional Factors Info  Code Status,Allergies Code Status Info: FULL Allergies Info: Oysters (shellfish Allergy),Codeine,Penicillins           Current Medications (09/05/2020):  This is the current hospital active medication list Current Facility-Administered Medications  Medication Dose Route Frequency Provider Last Rate Last Admin  . acetaminophen (TYLENOL) tablet 650 mg  650 mg Oral Q8H Ainsley Spinner, PA-C   650 mg at 09/05/20 0549  . apixaban (ELIQUIS) tablet 5 mg  5 mg Oral BID Lavina Hamman, MD    5 mg at 09/05/20 1006  . cefdinir (OMNICEF) capsule 300 mg  300 mg Oral Q12H Lavina Hamman, MD   300 mg at 09/05/20 1006  . docusate sodium (COLACE) capsule 100 mg  100 mg Oral BID Ainsley Spinner, PA-C   100 mg at 09/05/20 1006  . HYDROcodone-acetaminophen (NORCO/VICODIN) 5-325 MG per tablet 1 tablet  1 tablet Oral Q6H PRN Ainsley Spinner, PA-C   1 tablet at 09/04/20 1532  . menthol-cetylpyridinium (CEPACOL) lozenge 3 mg  1 lozenge Oral PRN Ainsley Spinner, PA-C       Or  . phenol (CHLORASEPTIC) mouth spray 1 spray  1 spray Mouth/Throat PRN Ainsley Spinner, PA-C      . metroNIDAZOLE (FLAGYL) tablet 500 mg  500 mg Oral Q8H Berle Mull M, MD      . morphine 2 MG/ML injection 0.5-1 mg  0.5-1 mg Intravenous Q2H PRN Ainsley Spinner, PA-C      . mupirocin ointment (BACTROBAN) 2 % 1 application  1 application Nasal BID Ainsley Spinner, PA-C   1 application at 05/01/11 1018  . ondansetron (ZOFRAN) injection 4 mg  4 mg Intravenous Q6H PRN Ainsley Spinner, PA-C      . ondansetron [facility]) tablet 4 mg  4 mg Oral Q6H PRN Ainsley Spinner, PA-C       Or  . ondansetron [facility]) injection 4 mg  4 mg Intravenous Q6H PRN Ainsley Spinner, PA-C         Discharge Medications: Please see discharge summary for a list of discharge medications.  Relevant Imaging Results:  Relevant Lab Results:   Additional Information SSN [id number] Patient has received covid vaccine and booster shot  Vinie Sill, LCSWA

## 2020-09-05 NOTE — Progress Notes (Addendum)
[facility] Progress Note  Patient: [name]    HER:[id number]  DOA: 09/02/2020     Date of Service: the patient was seen and examined on 09/05/2020  Brief hospital course: Past medical history of breast cancer, PE DVT, depression, melanoma and squamous cell cancer, chronic systolic CHF, EF 85%, CAD managed medically, recurrent falls with previous left hip fracture. Patient presented with a fall and left hip pain. Found to have left intertrochanteric fracture again. Cardiology was consulted by ER Due to abnormal EKG recommended no further work-up. SP intramedullary nailing on 2/10. Currently plan is monitor postoperative recovery.  Assessment and Plan: 1. Displaced left intertrochanteric hip fracture Mechanical fall SP intramedullary nailing of the left hip as well as removal of the prior left femur implant.  2/10 Patient was in the bathroom, turned around and and slipped and had a fall. Denies having any head injury. Found to have left hip fracture. Orthopedic have been consulted. Surgery recommended. Patient remains at moderate to high risk given her cardiac history but not a candidate for intervention. D/w Husband and was agreeable to the risk involved. Surgery went well.  For now we will monitor  2. Preoperative medical evaluation No Coronary revascularization/CVA within 5 years. Had Recent stress test in 10/2019, which was positive. Pt was deemed high risk for cardiac intervention back then and plan was for medical management based on prior Cardiology notes. Stress test was done as part of pre-op evaluation in the setting of a hip fracture and low EF on Echocardiogram. Pt tolerated the surgery well in 2021.  At risk for bleeding postop, for urinary retention, volume overload and postop hypotension as well.  3.  Chronic systolic CHF Potential Takotsubo syndrome History of CAD HLD Patient has history of EF of 40 to 45% on echo in April 2021. Stress test with  reversible ischemia in April 2021 as well. Managed medically. Echocardiogram continues to show 45% EF without any new changes. Continue ARB as much as she can tolerate.  For now we will hold due to soft blood pressure. Continue statin  4.  History of PE DVT Varicose vein Bilateral saphenectomy history Acute postop blood loss anemia. Currently on anticoagulation with Eliquis. Postop will require continued anticoagulation given her increased risk. Currently no active bleeding but hemoglobin dropped from 11's to 8's.  Currently stable.  Monitor  5.  Essential hypertension Currently on losartan. As needed diuretic. Holding both of them for now.  6.  PVC/NSVT/junctional tachycardia Unable to tolerate beta-blockers. Monitor.  7.  Pulmonary contusion versus aspiration pneumonia. Chest x-ray shows evidence of right lower lobe infiltrate. We will start her on antibiotics.  8.  Abdominal pain. Appears to be severely constipated. Bladder scan 105 only. Will initiate aggressive bowel regimen.  Diet: Cardiac diet DVT Prophylaxis: Therapeutic Anticoagulation with apixaban  SCDs Start: 09/03/20 1326 SCDs Start: 09/02/20 2146 apixaban (ELIQUIS) tablet 5 mg    Advance goals of care discussion: Full code  Family Communication: no family was present at bedside, at the time of interview.  Unable to reach the husband.  Left voicemail.  Disposition:  Status is: Inpatient  Remains inpatient appropriate because: Need for close monitoring for postop blood loss anemia and stability   Dispo:  Patient From: Home  Planned Disposition: [place]  Expected discharge date: 09/06/2020  Medically stable for discharge: No    Subjective: Denies any acute complaint.  Denies any acute events tonight.  No nausea no vomiting.  Physical Exam:  General:  Appear in mild distress, no Rash; Oral Mucosa Clear, moist. no Abnormal Neck Mass Or lumps, Conjunctiva normal  Cardiovascular: S1  and S2 Present, no Murmur, Respiratory: good respiratory effort, Bilateral Air entry present and CTA, no Crackles, Occasional wheezes Abdomen: Bowel Sound present, Soft and no tenderness Extremities: no Pedal edema Neurology: alert and oriented to place, and person affect appropriate. no new focal deficit Gait not checked due to patient safety concerns  Vitals:   09/04/20 1945 09/05/20 0440 09/05/20 0800 09/05/20 1329  BP:  (!) 109/53 (!) 100/45 (!) 122/51  Pulse:  79 70 83  Resp:  15 16 15   Temp:  98.6 F (37 C) (!) 97.5 F (36.4 C) 97.7 F (36.5 C)  TempSrc: Oral Axillary Oral Oral  SpO2:  96% 95% 95%  Weight:      Height:        Intake/Output Summary (Last 24 hours) at 09/05/2020 1829 Last data filed at 09/05/2020 3614 Gross per 24 hour  Intake --  Output 650 ml  Net -650 ml   Filed Weights   09/02/20 2010  Weight: 62.3 kg    Data Reviewed: I have personally reviewed and interpreted daily labs, tele strips, imaging. I reviewed all nursing notes, pharmacy notes, vitals, pertinent old records I have discussed plan of care as described above with RN and patient/family.  CBC: Recent Labs  Lab 09/02/20 2124 09/03/20 0405 09/04/20 0131 09/04/20 1233 09/05/20 0155  WBC 10.3 8.1 8.1  --  8.1  NEUTROABS 8.7*  --   --   --   --   HGB 11.8* 11.2* 8.8* 8.5* 8.1*  HCT 37.1 34.9* 27.5* 26.6* 24.4*  MCV 95.4 95.1 96.5  --  94.2  PLT 151 151 128*  --  431*   Basic Metabolic Panel: Recent Labs  Lab 09/02/20 2124 09/03/20 0405 09/04/20 0131 09/05/20 0155  NA 138 137 136 137  K 3.5 3.5 3.5 3.6  CL 101 102 102 102  CO2 24 27 25 27   GLUCOSE 108* 219* 128* 115*  BUN 25* 20 12 12   CREATININE 0.67 0.62 0.61 0.58  CALCIUM 9.3 8.9 8.4* 8.6*    Studies: DG CHEST PORT 1 VIEW  Result Date: 09/04/2020 CLINICAL DATA:  Shortness of breath and productive cough. EXAM: PORTABLE CHEST 1 VIEW COMPARISON:  09/02/2020 FINDINGS: The heart is mildly enlarged but appears stable.  Chronic lung changes. Patchy airspace opacity in the right lower lobe could reflect an infiltrate or pulmonary contusion. Numerous healed or healing bilateral rib fractures. IMPRESSION: Right lower lobe infiltrate versus pulmonary contusion. Electronically Signed   By: Marijo Sanes M.D.   On: 09/04/2020 19:27   DG Abd Portable 1V  Result Date: 09/05/2020 CLINICAL DATA:  Abdominal distension EXAM: PORTABLE ABDOMEN - 1 VIEW COMPARISON:  May 07, 2018 FINDINGS: Moderate stool present in colon. No bowel dilatation or air-fluid level to suggest bowel obstruction. No free air. Status post multiple kyphoplasty procedures in the lumbar spine. There is bibasilar atelectasis. Postoperative change in proximal left femur. IMPRESSION: No bowel obstruction or free air.  Moderate stool in colon. Electronically Signed   By: Lowella Grip III M.D.   On: 09/05/2020 14:39    Scheduled Meds: . acetaminophen  650 mg Oral Q8H  . apixaban  5 mg Oral BID  . cefdinir  300 mg Oral Q12H  . docusate sodium  100 mg Oral BID  . lactulose  20 g Oral BID  . metroNIDAZOLE  500 mg Oral Q8H  .  mupirocin ointment  1 application Nasal BID  . senna-docusate  2 tablet Oral BID   Continuous Infusions:  PRN Meds: HYDROcodone-acetaminophen, menthol-cetylpyridinium **OR** phenol, morphine injection, ondansetron (ZOFRAN) IV, ondansetron **OR** ondansetron (ZOFRAN) IV  Time spent: 35 minutes  Author: Berle Mull, MD [facility] 09/05/2020 6:29 PM  To reach On-call, see care teams to locate the attending and reach out via [email or web address]. Between 7PM-7AM, please contact night-coverage If you still have difficulty reaching the attending provider, please page the [facility] (Director on Call) for [facility] on amion for assistance.

## 2020-09-05 NOTE — TOC Progression Note (Deleted)
Transition of Care [facility]) - Progression Note    Patient Details  Name: [name] MRN: [id number] Date of Birth: 28-Jul-1931  Transition [facility] Care [facility]) CM/SW [place], [place] Phone Number: 09/05/2020, 11:23 AM  Clinical Narrative:    CSW spoke with pt at bedside and introduce self and role.  Pt was very pleasant. Pt explained that she fell in her bathroom on her left side.  Pt is agreeable for CSW to send out to SNF's in [place] for possible placement.  Medicare .gov list was left at bedside for pt's spouse to review.  CSW reached out to pt's spouse and daughter.  Pt's daughter was given web site information for medicare.gov to review.  Pt's spouse returned phone call.  CSW explained that a SNF list was left at pt's bedside for review.  TOC team will continue to assist for disposition planning.        Expected Discharge Plan and Services                                                 Social Determinants of Health (SDOH) Interventions    Readmission Risk Interventions Readmission Risk Prevention Plan 11/04/2019  Transportation Screening Complete  PCP or Specialist Appt within 5-7 Days Complete  Home Care Screening Complete  Medication Review (RN CM) Complete  Some recent data might be hidden

## 2020-09-05 NOTE — Evaluation (Signed)
Clinical/Bedside Swallow Evaluation Patient Details  Name: [name] MRN: [id number] Date of Birth: [birth year]  Today's Date: 09/05/2020 Time: SLP Start Time (ACUTE ONLY): 55 SLP Stop Time (ACUTE ONLY): 1545 SLP Time Calculation (min) (ACUTE ONLY): 15 min  Past Medical History:  Past Medical History:  Diagnosis Date  . Arthritis    "in my back"  . Breast cancer ([place]) 1996   left breast cancer   . Complication of anesthesia 04/18/2012   "didn't tolerate it today very well; had the shakes and very hard time w/it"  . Depression 11/20/2012  . DVT (deep venous thrombosis) ([place])   . High cholesterol   . History of alcoholism ([place])    7 1/2 years clean  . Hypertension   . Melanoma ([place]) 01/15/2004   upper right arm  . Osteoporosis   . Peripheral vascular disease ([facility])    hx of ligation   . Personal history of radiation therapy 1993   Left Breast Cancer  . SCC (squamous cell carcinoma) 01/15/2004   left post upper arm, Right elbow, post left knee  . SCC (squamous cell carcinoma) 05/21/2013   right forearm, right jawline, left cheek  . SCC (squamous cell carcinoma) 08/24/2016   right shin,right thigh  . SCC (squamous cell carcinoma) 11/01/2016  . SCC (squamous cell carcinoma) 02/02/2018   left outer arm,right outer thigh  . SCC (squamous cell carcinoma) 12/13/2018   right jawline  . Spinal stenosis    Past Surgical History:  Past Surgical History:  Procedure Laterality Date  . BREAST LUMPECTOMY Left 1996  . HARDWARE REMOVAL Left 09/03/2020   Procedure: HARDWARE REMOVAL;  Surgeon: Altamese Morrow, MD;  Location: [place] [facility];  Service: Orthopedics;  Laterality: Left;  . I & D EXTREMITY  04/17/2012   Procedure: IRRIGATION AND DEBRIDEMENT EXTREMITY;  Surgeon: Rudean Haskell, MD;  Location: [place];  Service: Orthopedics;  Laterality: Right;  . INTRAMEDULLARY (IM) NAIL INTERTROCHANTERIC Left 09/03/2020   Procedure: INTRAMEDULLARY (IM) NAIL INTERTROCHANTRIC;  Surgeon: Altamese [place] de [place], MD;  Location: [place];  Service: Orthopedics;  Laterality: Left;  . KNEE ARTHROSCOPY  04/17/2012   Procedure: ARTHROSCOPY KNEE;  Surgeon: Rudean Haskell, MD;  Location: [place];  Service: Orthopedics;  Laterality: Right;  . KYPHOPLASTY  2009, 2013   thorasic, lumbar  . MENISECTOMY Right 04/11/2012  . ORIF FEMUR FRACTURE Left 11/01/2019   Procedure: OPEN REDUCTION INTERNAL FIXATION (ORIF) DISTAL FEMUR FRACTURE;  Surgeon: Altamese Scraper, MD;  Location: [facility];  Service: Orthopedics;  Laterality: Left;  . ORIF PERIPROSTHETIC FRACTURE Right 05/03/2018   Procedure: RIGHT RETROGRADE FEMORAL NAIL;  Surgeon: Rod Can, MD;  Location: [facility];  Service: Orthopedics;  Laterality: Right;  . POSTERIOR LAMINECTOMY / DECOMPRESSION LUMBAR SPINE  1980's  . rotator cuff surgery  Right   . TONSILLECTOMY AND ADENOIDECTOMY     "I was a child"  . TOTAL KNEE ARTHROPLASTY Right 10/29/2012   Procedure: RIGHT TOTAL KNEE ARTHROPLASTY;  Surgeon: Gearlean Alf, MD;  Location: [facility];  Service: Orthopedics;  Laterality: Right;  . VEIN LIGATION     HPI:  Past medical history of breast cancer, PE DVT, recurrent falls with previous left hip fracture. Patient presented with a fall and left hip pain. Found to have left intertrochanteric fracture again. S/p intramedullary nailing on 2/10.   Assessment / Plan / Recommendation Clinical Impression   Pt presents with no overt s/s of aspiration with solids or liquids.  Oral phase was efficient for containing and  clearing boluses from the oral cavity.  Pt denies and difficulty swallowing and the only deficits mentioned by nursing were that pt needed to have her pills with puree sometimes.  Pt may remain on her currently prescribed diet with meds administered as tolerated.  No further ST needs identified at this time.        Aspiration Risk  Mild aspiration risk    Diet Recommendation Dysphagia 3 (Mech soft);Thin liquid   Liquid Administration via: Cup;Straw Medication  Administration:  (as tolerated) Supervision: Patient able to self feed;Intermittent supervision to cue for compensatory strategies Compensations: Minimize environmental distractions;Slow rate;Small sips/bites Postural Changes: Seated upright at 90 degrees;Remain upright for at least 30 minutes after po intake    Other  Recommendations Oral Care Recommendations: Oral care BID   Follow up Recommendations None      Frequency and Duration            Prognosis Prognosis for Safe Diet Advancement: Good      Swallow Study   General HPI: Past medical history of breast cancer, PE DVT, recurrent falls with previous left hip fracture. Patient presented with a fall and left hip pain. Found to have left intertrochanteric fracture again. S/p intramedullary nailing on 2/10. Type of Study: Bedside Swallow Evaluation Previous Swallow Assessment: none on record Diet Prior to this Study: Dysphagia 3 (soft);Thin liquids Temperature Spikes Noted: No Respiratory Status: Nasal cannula History of Recent Intubation: Yes Length of Intubations (days):  (for surgery) Behavior/Cognition: Alert;Cooperative Oral Cavity - Dentition: Adequate natural dentition Vision: Functional for self-feeding Self-Feeding Abilities: Able to feed self;Needs set up Patient Positioning: Upright in bed Baseline Vocal Quality: Normal    Oral/Motor/Sensory Function Overall Oral Motor/Sensory Function: Within functional limits   Ice Chips     Thin Liquid Thin Liquid: Within functional limits    Nectar Thick     Honey Thick     Puree Puree: Within functional limits   Solid     Solid: Within functional limits      Emilio Math 09/05/2020,3:50 PM

## 2020-09-06 LAB — CBC
HCT: 25.3 % — ABNORMAL LOW (ref 36.0–46.0)
Hemoglobin: 8.4 g/dL — ABNORMAL LOW (ref 12.0–15.0)
MCH: 31 pg (ref 26.0–34.0)
MCHC: 33.2 g/dL (ref 30.0–36.0)
MCV: 93.4 fL (ref 80.0–100.0)
Platelets: 130 10*3/uL — ABNORMAL LOW (ref 150–400)
RBC: 2.71 MIL/uL — ABNORMAL LOW (ref 3.87–5.11)
RDW: 13.7 % (ref 11.5–15.5)
WBC: 7.8 10*3/uL (ref 4.0–10.5)
nRBC: 0 % (ref 0.0–0.2)

## 2020-09-06 LAB — BASIC METABOLIC PANEL
Anion gap: 9 (ref 5–15)
BUN: 12 mg/dL (ref 8–23)
CO2: 27 mmol/L (ref 22–32)
Calcium: 8.7 mg/dL — ABNORMAL LOW (ref 8.9–10.3)
Chloride: 100 mmol/L (ref 98–111)
Creatinine, Ser: 0.54 mg/dL (ref 0.44–1.00)
GFR, Estimated: >60 mL/min (ref >60)
Glucose, Bld: 112 mg/dL — ABNORMAL HIGH (ref 70–99)
Potassium: 3.5 mmol/L (ref 3.5–5.1)
Sodium: 136 mmol/L (ref 135–145)

## 2020-09-06 LAB — MAGNESIUM: Magnesium: 1.4 mg/dL — ABNORMAL LOW (ref 1.7–2.4)

## 2020-09-06 LAB — TECHNOLOGIST SMEAR REVIEW

## 2020-09-06 LAB — IRON AND TIBC
Iron: 14 ug/dL — ABNORMAL LOW (ref 28–170)
Saturation Ratios: 6 % — ABNORMAL LOW (ref 10.4–31.8)
TIBC: 220 ug/dL — ABNORMAL LOW (ref 250–450)
UIBC: 206 ug/dL

## 2020-09-06 LAB — VITAMIN B12: Vitamin B-12: 589 pg/mL (ref 180–914)

## 2020-09-06 MED ORDER — TRAMADOL HCL 50 MG PO TABS
50.0000 mg | ORAL_TABLET | Freq: Four times a day (QID) | ORAL | Status: DC | PRN
  Administered 2020-09-07 – 2020-09-11 (×4): 50 mg via ORAL
  Filled 2020-09-06 (×5): qty 1

## 2020-09-06 MED ORDER — SODIUM CHLORIDE 0.9 % IV SOLN
510.0000 mg | Freq: Once | INTRAVENOUS | Status: AC
  Administered 2020-09-06: 510 mg via INTRAVENOUS
  Filled 2020-09-06: qty 17

## 2020-09-06 MED ORDER — CHLORHEXIDINE GLUCONATE CLOTH 2 % EX PADS
6.0000 | MEDICATED_PAD | Freq: Every day | CUTANEOUS | Status: DC
  Administered 2020-09-07 – 2020-09-09 (×3): 6 via TOPICAL

## 2020-09-06 MED ORDER — TAMSULOSIN HCL 0.4 MG PO CAPS
0.4000 mg | ORAL_CAPSULE | Freq: Every day | ORAL | Status: DC
  Administered 2020-09-06 – 2020-09-11 (×6): 0.4 mg via ORAL
  Filled 2020-09-06 (×6): qty 1

## 2020-09-06 MED ORDER — MAGNESIUM SULFATE 2 GM/50ML IV SOLN
2.0000 g | Freq: Once | INTRAVENOUS | Status: AC
  Administered 2020-09-06: 2 g via INTRAVENOUS
  Filled 2020-09-06: qty 50

## 2020-09-06 NOTE — Progress Notes (Signed)
[facility] Progress Note  Patient: [name]    RKY:[id number]  DOA: 09/02/2020     Date of Service: the patient was seen and examined on 09/06/2020  Brief hospital course: Past medical history of breast cancer, PE DVT, depression, melanoma and squamous cell cancer, chronic systolic CHF, EF 31%, CAD managed medically, recurrent falls with previous left hip fracture. Patient presented with a fall and left hip pain. Found to have left intertrochanteric fracture again. Cardiology was consulted by ER Due to abnormal EKG recommended no further work-up. SP intramedullary nailing on 2/10. Currently plan is monitor postoperative recovery.  Assessment and Plan: 1. Displaced left intertrochanteric hip fracture Mechanical fall SP intramedullary nailing of the left hip as well as removal of the prior left femur implant.  2/10 Patient was in the bathroom, turned around and and slipped and had a fall. Denies having any head injury. Found to have left hip fracture. Orthopedic have been consulted. Surgery recommended. Patient remains at moderate to high risk given her cardiac history but not a candidate for intervention. D/w Husband and was agreeable to the risk involved. Surgery went well.  For now we will monitor Due to increased fatigue and tiredness pain medication changed to tramadol.  2. Preoperative medical evaluation No Coronary revascularization/CVA within 5 years. Had Recent stress test in 10/2019, which was positive. Pt was deemed high risk for cardiac intervention back then and plan was for medical management based on prior Cardiology notes. Stress test was done as part of pre-op evaluation in the setting of a hip fracture and low EF on Echocardiogram. Pt tolerated the surgery well in 2021.  At risk for bleeding postop, for urinary retention, volume overload and postop hypotension as well.  3.  Chronic systolic CHF Potential Takotsubo syndrome History of CAD HLD Patient  has history of EF of 40 to 45% on echo in April 2021. Stress test with reversible ischemia in April 2021 as well. Managed medically. Echocardiogram continues to show 45% EF without any new changes. Continue ARB as much as she can tolerate.  For now we will hold due to soft blood pressure. Continue statin  4.  History of PE DVT Varicose vein Bilateral saphenectomy history Acute postop blood loss anemia. Iron deficiency Currently on anticoagulation with Eliquis. Postop will require continued anticoagulation given her increased risk. Currently no active bleeding but hemoglobin dropped from 11's to 8's.  Currently stable.  Monitor Iron level significantly low.  We will treat with IV iron therapy.  5.  Essential hypertension Currently on losartan. As needed diuretic. Holding both of them for now.  6.  PVC/NSVT/junctional tachycardia Hypomagnesemia Unable to tolerate beta-blockers. Currently magnesium replaced. Monitor.  7.  Pulmonary contusion versus aspiration pneumonia. Chest x-ray shows evidence of right lower lobe infiltrate. We will start her on antibiotics.  8.  Abdominal pain. Constipation Appears to be severely constipated. Continue bowel regimen  9.  Acute urinary retention. Required in and out x3. Currently we will insert Foley catheter. Voiding trial in 1 week.  Diet: Cardiac diet DVT Prophylaxis: Therapeutic Anticoagulation with apixaban  SCDs Start: 09/03/20 1326 SCDs Start: 09/02/20 2146 apixaban (ELIQUIS) tablet 5 mg    Advance goals of care discussion: Full code  Family Communication: no family was present at bedside, at the time of interview.  Unable to reach the husband.  Left voicemail.  Disposition:  Status is: Inpatient  Remains inpatient appropriate because: Need for close monitoring for postop blood loss anemia and stability  Dispo:  Patient From: Home  Planned Disposition: [place]  Expected discharge date:  09/06/2020  Medically stable for discharge: No    Subjective: No nausea no vomiting but no fever no chills.  No chest pain.  Continues to have some abdominal discomfort.  Oral intake is adequate.   Physical Exam:  General: Appear in mild distress, no Rash; Oral Mucosa Clear, moist. no Abnormal Neck Mass Or lumps, Conjunctiva normal  Cardiovascular: S1 and S2 Present, no Murmur, Respiratory: good respiratory effort, Bilateral Air entry present and CTA, no Crackles, no wheezes Abdomen: Bowel Sound present, Soft and no tenderness Extremities: no Pedal edema Neurology: Tired, but awake and oriented person affect appropriate. no new focal deficit Gait not checked due to patient safety concerns  Vitals:   09/05/20 1929 09/06/20 0455 09/06/20 0916 09/06/20 1629  BP: (!) 111/54 123/63 (!) 107/50 (!) 114/47  Pulse: 74 89 74 64  Resp: 17 18 16 16   Temp: (!) 97.5 F (36.4 C) 98.8 F (37.1 C) (!) 97.5 F (36.4 C) 97.9 F (36.6 C)  TempSrc: Oral Axillary Axillary Oral  SpO2: 99% 96% 93% 96%  Weight:      Height:        Intake/Output Summary (Last 24 hours) at 09/06/2020 1834 Last data filed at 09/06/2020 1540 Gross per 24 hour  Intake -  Output 1150 ml  Net -1150 ml   Filed Weights   09/02/20 2010  Weight: 62.3 kg    Data Reviewed: I have personally reviewed and interpreted daily labs, tele strips, imaging. I reviewed all nursing notes, pharmacy notes, vitals, pertinent old records I have discussed plan of care as described above with RN and patient/family.  CBC: Recent Labs  Lab 09/02/20 2124 09/03/20 0405 09/04/20 0131 09/04/20 1233 09/05/20 0155 09/06/20 0222  WBC 10.3 8.1 8.1  --  8.1 7.8  NEUTROABS 8.7*  --   --   --   --   --   HGB 11.8* 11.2* 8.8* 8.5* 8.1* 8.4*  HCT 37.1 34.9* 27.5* 26.6* 24.4* 25.3*  MCV 95.4 95.1 96.5  --  94.2 93.4  PLT 151 151 128*  --  114* 426*   Basic Metabolic Panel: Recent Labs  Lab 09/02/20 2124 09/03/20 0405 09/04/20 0131  09/05/20 0155 09/06/20 0222  NA 138 137 136 137 136  K 3.5 3.5 3.5 3.6 3.5  CL 101 102 102 102 100  CO2 24 27 25 27 27   GLUCOSE 108* 219* 128* 115* 112*  BUN 25* 20 12 12 12   CREATININE 0.67 0.62 0.61 0.58 0.54  CALCIUM 9.3 8.9 8.4* 8.6* 8.7*  MG  --   --   --   --  1.4*    Studies: No results found.  Scheduled Meds: . acetaminophen  650 mg Oral Q8H  . apixaban  5 mg Oral BID  . cefdinir  300 mg Oral Q12H  . lactulose  20 g Oral BID  . metroNIDAZOLE  500 mg Oral Q8H  . mupirocin ointment  1 application Nasal BID  . senna-docusate  2 tablet Oral BID  . tamsulosin  0.4 mg Oral Daily   Continuous Infusions:  PRN Meds: menthol-cetylpyridinium **OR** phenol, morphine injection, ondansetron (ZOFRAN) IV, ondansetron **OR** ondansetron (ZOFRAN) IV, traMADol  Time spent: 35 minutes  Author: Berle Mull, MD [facility] 09/06/2020 6:34 PM  To reach On-call, see care teams to locate the attending and reach out via [email or web address]. Between 7PM-7AM, please contact night-coverage If you still have  difficulty reaching the attending provider, please page the [facility] (Director on Call) for [facility] on amion for assistance.

## 2020-09-06 NOTE — Care Management (Signed)
Notified by home health liaison that prior to admission patient was active w [facility]. Should plans change to patient being discharged home with home health services she would need [facility] resumption orders.

## 2020-09-06 NOTE — Plan of Care (Signed)

## 2020-09-07 LAB — CBC
HCT: 23.1 % — ABNORMAL LOW (ref 36.0–46.0)
HCT: 25.3 % — ABNORMAL LOW (ref 36.0–46.0)
Hemoglobin: 7.7 g/dL — ABNORMAL LOW (ref 12.0–15.0)
Hemoglobin: 8.6 g/dL — ABNORMAL LOW (ref 12.0–15.0)
MCH: 31.7 pg (ref 26.0–34.0)
MCH: 31.7 pg (ref 26.0–34.0)
MCHC: 33.3 g/dL (ref 30.0–36.0)
MCHC: 34 g/dL (ref 30.0–36.0)
MCV: 95.1 fL (ref 80.0–100.0)
MCV: 93.4 fL (ref 80.0–100.0)
Platelets: 135 10*3/uL — ABNORMAL LOW (ref 150–400)
Platelets: 153 10*3/uL (ref 150–400)
RBC: 2.43 MIL/uL — ABNORMAL LOW (ref 3.87–5.11)
RBC: 2.71 MIL/uL — ABNORMAL LOW (ref 3.87–5.11)
RDW: 14.3 % (ref 11.5–15.5)
RDW: 14 % (ref 11.5–15.5)
WBC: 4.9 10*3/uL (ref 4.0–10.5)
WBC: 5.8 10*3/uL (ref 4.0–10.5)
nRBC: 0 % (ref 0.0–0.2)
nRBC: 0 % (ref 0.0–0.2)

## 2020-09-07 LAB — BASIC METABOLIC PANEL
Anion gap: 10 (ref 5–15)
BUN: 14 mg/dL (ref 8–23)
CO2: 27 mmol/L (ref 22–32)
Calcium: 8.7 mg/dL — ABNORMAL LOW (ref 8.9–10.3)
Chloride: 102 mmol/L (ref 98–111)
Creatinine, Ser: 0.57 mg/dL (ref 0.44–1.00)
GFR, Estimated: >60 mL/min (ref >60)
Glucose, Bld: 82 mg/dL (ref 70–99)
Potassium: 3.4 mmol/L — ABNORMAL LOW (ref 3.5–5.1)
Sodium: 139 mmol/L (ref 135–145)

## 2020-09-07 LAB — MAGNESIUM: Magnesium: 1.7 mg/dL (ref 1.7–2.4)

## 2020-09-07 NOTE — Progress Notes (Signed)
[facility] Progress Note  Patient: [name]    NLZ:[id number]  DOA: 09/02/2020     Date of Service: the patient was seen and examined on 09/07/2020  Brief hospital course: Past medical history of breast cancer, PE DVT, depression, melanoma and squamous cell cancer, chronic systolic CHF, EF 90%, CAD managed medically, recurrent falls with previous left hip fracture. Patient presented with a fall and left hip pain. Found to have left intertrochanteric fracture again. Cardiology was consulted by ER Due to abnormal EKG recommended no further work-up. SP intramedullary nailing on 2/10. Currently plan is monitor postoperative recovery.  Assessment and Plan: 1. Displaced left intertrochanteric hip fracture Mechanical fall SP intramedullary nailing of the left hip as well as removal of the prior left femur implant.  2/10 Patient was in the bathroom, turned around and and slipped and had a fall. Denies having any head injury. Found to have left hip fracture. Orthopedic have been consulted. Surgery recommended. Patient remains at moderate to high risk given her cardiac history but not a candidate for intervention. D/w Husband and was agreeable to the risk involved. Surgery went well.  For now we will monitor Due to increased fatigue and tiredness pain medication changed to tramadol.  2. Preoperative medical evaluation No Coronary revascularization/CVA within 5 years. Had Recent stress test in 10/2019, which was positive. Pt was deemed high risk for cardiac intervention back then and plan was for medical management based on prior Cardiology notes. Stress test was done as part of pre-op evaluation in the setting of a hip fracture and low EF on Echocardiogram. Pt tolerated the surgery well in 2021.  At risk for bleeding postop, for urinary retention, volume overload and postop hypotension as well.  3.  Chronic systolic CHF Potential Takotsubo syndrome History of CAD HLD Patient  has history of EF of 40 to 45% on echo in April 2021. Stress test with reversible ischemia in April 2021 as well. Managed medically. Echocardiogram continues to show 45% EF without any new changes. Continue ARB as much as she can tolerate.  For now we will hold due to soft blood pressure. Continue statin  4.  History of PE DVT Varicose vein Bilateral saphenectomy history Acute postop blood loss anemia. Iron deficiency Currently on anticoagulation with Eliquis. Postop will require continued anticoagulation given her increased risk. Currently no active bleeding but hemoglobin dropped from 11's to 8's.  Currently stable.  Monitor Iron level significantly low.  We will treat with IV iron therapy.  5.  Essential hypertension Currently on losartan. As needed diuretic. Holding both of them for now.  6.  PVC/NSVT/junctional tachycardia Hypomagnesemia Unable to tolerate beta-blockers. Currently magnesium replaced. Monitor.  7.  Pulmonary contusion versus aspiration pneumonia. Chest x-ray shows evidence of right lower lobe infiltrate. We will start her on antibiotics.  8.  Abdominal pain. Constipation Appears to be severely constipated. Continue bowel regimen  9.  Acute urinary retention. Required in and out x3. Currently we will insert Foley catheter. Voiding trial in 1 week.  Diet: Cardiac diet DVT Prophylaxis: Therapeutic Anticoagulation with apixaban  SCDs Start: 09/03/20 1326 SCDs Start: 09/02/20 2146 apixaban (ELIQUIS) tablet 5 mg    Advance goals of care discussion: Full code  Family Communication: no family was present at bedside, at the time of interview.  Unable to reach the husband.  Left voicemail.  Social worker were also unable to reach the husband.  Disposition:  Status is: Inpatient  Remains inpatient appropriate because: Monitoring CBC  Dispo:  Patient From: Home  Planned Disposition: [place]  Expected discharge date:  09/08/2020  Medically stable for discharge: No    Subjective: No nausea no vomiting but no fever no chills.  Pain better.  Constipation resolved.   Physical Exam:  General: Appear in mild distress, no Rash; Oral Mucosa Clear, moist. no Abnormal Neck Mass Or lumps, Conjunctiva normal  Cardiovascular: S1 and S2 Present, no Murmur, Respiratory: Good respiratory effort, Bilateral Air entry present and bilateral  Crackles, no wheezes Abdomen: Bowel Sound present, Soft and no tenderness Extremities: trace Pedal edema Neurology: alert and oriented to time, place, and person affect appropriate. no new focal deficit Gait not checked due to patient safety concerns    Vitals:   09/07/20 0357 09/07/20 0809 09/07/20 1509 09/07/20 2048  BP: 117/60 (!) 117/57 121/63 129/66  Pulse: 66 71 65 69  Resp: 16 17 17 17   Temp: (!) 97.4 F (36.3 C) 97.9 F (36.6 C) 97.9 F (36.6 C) (!) 97.5 F (36.4 C)  TempSrc: Oral Oral Oral Oral  SpO2: 95% 93% 95% 95%  Weight:      Height:        Intake/Output Summary (Last 24 hours) at 09/07/2020 2116 Last data filed at 09/07/2020 1700 Gross per 24 hour  Intake 360 ml  Output 550 ml  Net -190 ml   Filed Weights   09/02/20 2010  Weight: 62.3 kg    Data Reviewed: I have personally reviewed and interpreted daily labs, tele strips, imaging. I reviewed all nursing notes, pharmacy notes, vitals, pertinent old records I have discussed plan of care as described above with RN and patient/family.  CBC: Recent Labs  Lab 09/02/20 2124 09/03/20 0405 09/04/20 0131 09/04/20 1233 09/05/20 0155 09/06/20 0222 09/07/20 0346 09/07/20 1215  WBC 10.3   < > 8.1  --  8.1 7.8 4.9 5.8  NEUTROABS 8.7*  --   --   --   --   --   --   --   HGB 11.8*   < > 8.8* 8.5* 8.1* 8.4* 7.7* 8.6*  HCT 37.1   < > 27.5* 26.6* 24.4* 25.3* 23.1* 25.3*  MCV 95.4   < > 96.5  --  94.2 93.4 95.1 93.4  PLT 151   < > 128*  --  114* 130* 135* 153   < > = values in this interval not displayed.    Basic Metabolic Panel: Recent Labs  Lab 09/03/20 0405 09/04/20 0131 09/05/20 0155 09/06/20 0222 09/07/20 0346  NA 137 136 137 136 139  K 3.5 3.5 3.6 3.5 3.4*  CL 102 102 102 100 102  CO2 27 25 27 27 27   GLUCOSE 219* 128* 115* 112* 82  BUN 20 12 12 12 14   CREATININE 0.62 0.61 0.58 0.54 0.57  CALCIUM 8.9 8.4* 8.6* 8.7* 8.7*  MG  --   --   --  1.4* 1.7    Studies: No results found.  Scheduled Meds: . acetaminophen  650 mg Oral Q8H  . apixaban  5 mg Oral BID  . cefdinir  300 mg Oral Q12H  . Chlorhexidine Gluconate Cloth  6 each Topical Daily  . lactulose  20 g Oral BID  . metroNIDAZOLE  500 mg Oral Q8H  . mupirocin ointment  1 application Nasal BID  . senna-docusate  2 tablet Oral BID  . tamsulosin  0.4 mg Oral Daily   Continuous Infusions:  PRN Meds: menthol-cetylpyridinium **OR** phenol, ondansetron (ZOFRAN) IV, ondansetron **  OR** ondansetron (ZOFRAN) IV, traMADol  Time spent: 35 minutes  Author: Berle Mull, MD [facility] 09/07/2020 9:16 PM  To reach On-call, see care teams to locate the attending and reach out via [email or web address]. Between 7PM-7AM, please contact night-coverage If you still have difficulty reaching the attending provider, please page the [facility] (Director on Call) for [facility] on amion for assistance.

## 2020-09-07 NOTE — Progress Notes (Signed)
Orthopaedic Trauma Service Progress Note  Patient ID: Chandria Rookstool MRN: [id number] DOB/AGE: March 06, 1932 87 y.o.  Subjective:  No complaints     ROS As above  Objective:   VITALS:   Vitals:   09/06/20 1629 09/06/20 2001 09/07/20 0357 09/07/20 0809  BP: (!) 114/47 (!) 115/51 117/60 (!) 117/57  Pulse: 64 74 66 71  Resp: 16 16 16 17   Temp: 97.9 F (36.6 C) (!) 97.2 F (36.2 C) (!) 97.4 F (36.3 C) 97.9 F (36.6 C)  TempSrc: Oral Oral Oral Oral  SpO2: 96% 91% 95% 93%  Weight:      Height:        Estimated body mass index is 22.17 kg/m as calculated from the following:   Height as of this encounter: 5\' 6"  (1.676 m).   Weight as of this encounter: 62.3 kg.   Intake/Output      02/13 0701 02/14 0700 02/14 0701 02/15 0700   P.O. 240    Total Intake(mL/kg) 240 (3.9)    Urine (mL/kg/hr) 1700 (1.1)    Total Output 1700    Net -1460           LABS  Results for orders placed or performed during the hospital encounter of 09/02/20 (from the past 24 hour(s))  CBC     Status: Abnormal   Collection Time: 09/07/20  3:46 AM  Result Value Ref Range   WBC 4.9 4.0 - 10.5 K/uL   RBC 2.43 (L) 3.87 - 5.11 MIL/uL   Hemoglobin 7.7 (L) 12.0 - 15.0 g/dL   HCT 23.1 (L) 36.0 - 46.0 %   MCV 95.1 80.0 - 100.0 fL   MCH 31.7 26.0 - 34.0 pg   MCHC 33.3 30.0 - 36.0 g/dL   RDW 14.3 11.5 - 15.5 %   Platelets 135 (L) 150 - 400 K/uL   nRBC 0.0 0.0 - 0.2 %  Basic metabolic panel     Status: Abnormal   Collection Time: 09/07/20  3:46 AM  Result Value Ref Range   Sodium 139 135 - 145 mmol/L   Potassium 3.4 (L) 3.5 - 5.1 mmol/L   Chloride 102 98 - 111 mmol/L   CO2 27 22 - 32 mmol/L   Glucose, Bld 82 70 - 99 mg/dL   BUN 14 8 - 23 mg/dL   Creatinine, Ser 0.57 0.44 - 1.00 mg/dL   Calcium 8.7 (L) 8.9 - 10.3 mg/dL   GFR, Estimated >60 >60 mL/min   Anion gap 10 5 - 15  Magnesium     Status: None    Collection Time: 09/07/20  3:46 AM  Result Value Ref Range   Magnesium 1.7 1.7 - 2.4 mg/dL     PHYSICAL EXAM:   Gen: sitting up in bed, more awake today   Ext:       Left Lower Extremity              Dressings c/d/i              Ext warm              + DP pulse             No DCT             Compartments are soft  Distal motor and sensory functions intact   Swelling controlled    Assessment/Plan: 4 Days Post-Op   Principal Problem:   Displaced intertrochanteric fracture of left femur, initial encounter for closed fracture ([place]) Active Problems:   OA (osteoarthritis) of knee   Pure hypercholesterolemia   Essential hypertension, benign   PAT (paroxysmal atrial tachycardia) (HCC)   Prolonged QT interval   Closed fracture of distal end of left femur, initial encounter ([place])   Anti-infectives (From admission, onward)   Start     Dose/Rate Route Frequency Ordered Stop   09/05/20 1400  metroNIDAZOLE (FLAGYL) tablet 500 mg        500 mg Oral Every 8 hours 09/05/20 1116 09/09/20 1359   09/05/20 1000  cefdinir (OMNICEF) capsule 300 mg        300 mg Oral Every 12 hours 09/05/20 0737 09/10/20 0959   09/03/20 1415  ceFAZolin (ANCEF) IVPB 2g/100 mL premix        2 g 200 mL/hr over 30 Minutes Intravenous Every 6 hours 09/03/20 1325 09/04/20 0511   09/03/20 0800  ceFAZolin (ANCEF) IVPB 2g/100 mL premix        2 g 200 mL/hr over 30 Minutes Intravenous To [facility] 09/02/20 2354 09/03/20 0908    .  POD/HD#: 30  87 y/o female s/p fall with left hip fracture proximal to distal femur hardware    -fall   - L intertrochanteric hip fracture s/p IMN             WBAT with assistance/walker             Range of motion as tolerated with hip and knee             Dressing changes left leg as needed                         Okay to leave wounds open once completely dry                         Clean wounds with soap and water only             PT and OT evaluations              Will likely need SNF   - Pain management:             Minimize narcotics             Multimodal             Scheduled Tylenol   - ABL anemia/Hemodynamics             stable  Monitor    - Medical issues              Per primary   - DVT/PE prophylaxis:             home Eliquis             SCDs   - ID:              Perioperative antibiotics - Metabolic Bone Disease:             Vitamin D levels look fine however patient has known osteoporosis given multiple fractures over the last 3 years from low-energy mechanisms   - Activity:             Up with assistance     - Dispo:  Continue with inpatient care             SNF when bed available              Follow-up with orthopedics in 10 to 14 days.  Remove sutures around 09/17/2020   Jari Pigg, PA-C [phone] (C) 09/07/2020, 10:46 AM  [facility] [place] [id number] [phone] Jenetta [phone] (F)    After 5pm and on the weekends please log on to Amion, go to orthopaedics and the look under the [facility] Call for the provider(s) on call. You can also call our office at [phone] and then follow the prompts to be connected to the call team.

## 2020-09-07 NOTE — Progress Notes (Signed)
Physical Therapy Treatment Patient Details Name: [name] MRN: [id number] DOB: 1931/10/13 Today's Date: 09/07/2020    History of Present Illness 87 y.o. female presents with fall and L hip fx, s/p previous hardware removal with IM nail of L hip ( 09/03/20 ).  PMH: Breats CA, DVT, skin CA, HTN, hypercholesterolemia, OA, and multiple leg fractures in the past.    PT Comments    Pt supine in bed on arrival.  Pt required assistance to move to edge of bed and with repeated cueing able to follow commands to reach for railing to pull into sitting.  She continues to require max assistance overall and sara stedy to pivot from surface to surface.  Continue to recommend SNF placement at d/c.     Follow Up Recommendations  SNF;Supervision/Assistance - 24 hour     Equipment Recommendations  Rolling walker with 5" wheels;3in1 (PT);Wheelchair (measurements PT);Wheelchair cushion (measurements PT)    Recommendations for Other Services       Precautions / Restrictions Precautions Precautions: Fall Restrictions Weight Bearing Restrictions: Yes LLE Weight Bearing: Weight bearing as tolerated    Mobility  Bed Mobility Overal bed mobility: Needs Assistance Bed Mobility: Supine to Sit     Supine to sit: Max assist     General bed mobility comments: assistance to advance LEs to edge of bed with use of bed rail with BUEs able to pull trunk into sitting unsupported.  Once in sitting max assistance to scoot hips to edge of bed.    Transfers Overall transfer level: Needs assistance Equipment used: Rolling walker (2 wheeled) Transfers: Sit to/from Stand Sit to Stand: From elevated surface;Max assist (into sara stedy frame.)         General transfer comment: Pt able to follow commands for hand placement and max assistance to boost from edge of bed.  Pt required assistance to maintain standing and allow for placement of sara stedy flaps.  Increased time to stand at recliner and return  to seated surface.  Ambulation/Gait Ambulation/Gait assistance:  (unable to shift weight to progress to gt training at this time.)               Stairs             Wheelchair Mobility    Modified Rankin (Stroke Patients Only)       Balance Overall balance assessment: Needs assistance   Sitting balance-Leahy Scale: Fair       Standing balance-Leahy Scale: Poor Standing balance comment: assistance to maintain standing.  Kyphotic at baseline.                            Cognition Arousal/Alertness: Awake/alert Behavior During Therapy: WFL for tasks assessed/performed Overall Cognitive Status: Impaired/Different from baseline Area of Impairment: Memory;Following commands;Problem solving                     Memory: Decreased short-term memory Following Commands: Follows one step commands inconsistently;Follows one step commands with increased time     Problem Solving: Difficulty sequencing;Requires verbal cues;Requires tactile cues        Exercises General Exercises - Lower Extremity Ankle Circles/Pumps: AROM;Both;10 reps;Supine Quad Sets: AROM;Both;10 reps;Supine Heel Slides: AAROM;AROM;Both;10 reps;Supine Hip ABduction/ADduction: AROM;AAROM;Both;10 reps;Supine Straight Leg Raises: AROM;AAROM;Both;10 reps;Supine    General Comments        Pertinent Vitals/Pain Pain Assessment: Faces Faces Pain Scale: Hurts little more Pain Location: LLE Pain Descriptors / Indicators: Grimacing;Guarding;Moaning  Pain Intervention(s): Monitored during session;Repositioned    Home Living                      Prior Function            PT Goals (current goals can now be found in the care plan section) Acute Rehab PT Goals Patient Stated Goal: less pain Potential to Achieve Goals: Fair Progress towards PT goals: Progressing toward goals    Frequency    Min 3X/week      PT Plan Current plan remains appropriate     Co-evaluation              AM-PAC PT "6 Clicks" Mobility   Outcome Measure  Help needed turning from your back to your side while in a flat bed without using bedrails?: A Lot Help needed moving from lying on your back to sitting on the side of a flat bed without using bedrails?: A Lot Help needed moving to and from a bed to a chair (including a wheelchair)?: A Lot Help needed standing up from a chair using your arms (e.g., wheelchair or bedside chair)?: A Lot Help needed to walk in hospital room?: Total Help needed climbing 3-5 steps with a railing? : Total 6 Click Score: 10    End of Session Equipment Utilized During Treatment: Gait belt;Oxygen Activity Tolerance: Patient limited by pain Patient left: in bed;with call bell/phone within reach;with bed alarm set Nurse Communication: Mobility status;Need for lift equipment PT Visit Diagnosis: Unsteadiness on feet (R26.81);Muscle weakness (generalized) (M62.81);History of falling (Z91.81);Difficulty in walking, not elsewhere classified (R26.2)     Time: 1235-1310 PT Time Calculation (min) (ACUTE ONLY): 35 min  Charges:  $Therapeutic Exercise: 8-22 mins $Therapeutic Activity: 8-22 mins                     Erasmo Leventhal , PTA Acute Rehabilitation Services Pager [phone] Office [phone]     Arg[place] Pablo Eli Hose 09/07/2020, 1:21 PM

## 2020-09-07 NOTE — TOC Progression Note (Signed)
Transition of Care [facility]) - Progression Note    Patient Details  Name: Tarra Pence MRN: [id number] Date of Birth: 05-17-1932  Transition [facility] CM/SW [place], [place] Phone Number: 09/07/2020, 3:33 PM  Clinical Narrative:     CSW met with patient to give bed offers for SNF placement. She noted she would like CSW to contact her family to discuss options. CSW attempted both spouse and daughter, and was unable to reach pt's family. SW will follow for bed choice and DC planning.  Expected Discharge Plan: [place] Barriers to Discharge: Continued Medical Work up,SNF Pending bed offer,Insurance Authorization  Expected Discharge Plan and Services Expected Discharge Plan: [place] In-house Referral: Clinical Social Work     Living arrangements for the past 2 months: Single Family Home                                       Social Determinants of Health (SDOH) Interventions    Readmission Risk Interventions Readmission Risk Prevention Plan 11/04/2019  Transportation Screening Complete  PCP or Specialist Appt within 5-7 Days Complete  Home Care Screening Complete  Medication Review (RN CM) Complete  Some recent data might be hidden

## 2020-09-08 ENCOUNTER — Encounter (HOSPITAL_COMMUNITY): Payer: Self-pay | Admitting: Internal Medicine

## 2020-09-08 LAB — BASIC METABOLIC PANEL
Anion gap: 9 (ref 5–15)
BUN: 15 mg/dL (ref 8–23)
CO2: 25 mmol/L (ref 22–32)
Calcium: 8.9 mg/dL (ref 8.9–10.3)
Chloride: 105 mmol/L (ref 98–111)
Creatinine, Ser: 0.52 mg/dL (ref 0.44–1.00)
GFR, Estimated: >60 mL/min (ref >60)
Glucose, Bld: 98 mg/dL (ref 70–99)
Potassium: 3.5 mmol/L (ref 3.5–5.1)
Sodium: 139 mmol/L (ref 135–145)

## 2020-09-08 LAB — CBC
HCT: 24.3 % — ABNORMAL LOW (ref 36.0–46.0)
Hemoglobin: 7.9 g/dL — ABNORMAL LOW (ref 12.0–15.0)
MCH: 30.6 pg (ref 26.0–34.0)
MCHC: 32.5 g/dL (ref 30.0–36.0)
MCV: 94.2 fL (ref 80.0–100.0)
Platelets: 174 10*3/uL (ref 150–400)
RBC: 2.58 MIL/uL — ABNORMAL LOW (ref 3.87–5.11)
RDW: 14 % (ref 11.5–15.5)
WBC: 5.5 10*3/uL (ref 4.0–10.5)
nRBC: 0 % (ref 0.0–0.2)

## 2020-09-08 LAB — MAGNESIUM: Magnesium: 1.6 mg/dL — ABNORMAL LOW (ref 1.7–2.4)

## 2020-09-08 MED ORDER — MAGNESIUM SULFATE 2 GM/50ML IV SOLN
2.0000 g | Freq: Once | INTRAVENOUS | Status: AC
  Administered 2020-09-08: 2 g via INTRAVENOUS
  Filled 2020-09-08: qty 50

## 2020-09-08 NOTE — Plan of Care (Signed)

## 2020-09-08 NOTE — Progress Notes (Signed)
[facility] Progress Note  Patient: [name]    MHD:[id number]  DOA: 09/02/2020     Date of Service: the patient was seen and examined on 09/08/2020  Brief hospital course: Past medical history of breast cancer, PE DVT, depression, melanoma and squamous cell cancer, chronic systolic CHF, EF 21%, CAD managed medically, recurrent falls with previous left hip fracture. Patient presented with a fall and left hip pain. Found to have left intertrochanteric fracture again. Cardiology was consulted by ER Due to abnormal EKG recommended no further work-up. SP intramedullary nailing on 2/10. Currently plan is arrange for safe discharge at [facility].  Unable to discharge unable to contact family.  Assessment and Plan: 1. Displaced left intertrochanteric hip fracture Mechanical fall SP intramedullary nailing of the left hip as well as removal of the prior left femur implant.  2/10 Patient was in the bathroom, turned around and and slipped and had a fall. Denies having any head injury. Found to have left hip fracture. Orthopedic have been consulted. Surgery recommended. Patient was moderate to high risk given her cardiac history but not a candidate for intervention. D/w Husband and was agreeable to the risk involved. Surgery went well.  For now we will monitor Due to increased fatigue and tiredness pain medication changed to tramadol.  2. Preoperative medical evaluation No Coronary revascularization/CVA within 5 years. Had Recent stress test in 10/2019, which was positive. Pt was deemed high risk for cardiac intervention back then and plan was for medical management based on prior Cardiology notes. Stress test was done as part of pre-op evaluation in the setting of a hip fracture and low EF on Echocardiogram. Pt tolerated the surgery well in 2021.  At risk for bleeding postop, for urinary retention, volume overload and postop hypotension as well.  3.  Chronic systolic CHF Potential  Takotsubo syndrome History of CAD HLD Patient has history of EF of 40 to 45% on echo in April 2021. Stress test with reversible ischemia in April 2021 as well. Managed medically. Echocardiogram continues to show 45% EF without any new changes. Initiate low-dose ARB. Continue statin  4.  History of PE DVT Varicose vein Bilateral saphenectomy history Acute postop blood loss anemia. Iron deficiency Currently on anticoagulation with Eliquis. Postop will require continued anticoagulation given her increased risk. Currently no active bleeding but hemoglobin dropped from 11's to 8's.  Currently stable.  Monitor. Iron level significantly low.  Treated with IV iron therapy.  5.  Essential hypertension Currently on losartan. As needed diuretic  6.  PVC/NSVT/junctional tachycardia Hypomagnesemia Unable to tolerate beta-blockers. Currently magnesium replaced. Monitor.  7.  Pulmonary contusion versus aspiration pneumonia. Chest x-ray shows evidence of right lower lobe infiltrate. We will start her on antibiotics.  8.  Abdominal pain. Constipation Appears to be severely constipated. Continue bowel regimen  9.  Acute urinary retention. Required in and out x3. Foley catheter was inserted.  Will attempt voiding trial on 2/15.  Diet: Cardiac diet DVT Prophylaxis: Therapeutic Anticoagulation with apixaban  SCDs Start: 09/03/20 1326 SCDs Start: 09/02/20 2146 apixaban (ELIQUIS) tablet 5 mg    Advance goals of care discussion: Full code  Family Communication: no family was present at bedside, at the time of interview.  Unable to reach the husband.   Disposition:  Status is: Inpatient  Remains inpatient appropriate because: Monitoring CBC  Dispo:  Patient From: Home  Planned Disposition: [place]  Expected discharge date: 09/09/2020  Medically stable for discharge: No    Subjective:  No nausea no vomiting or no fever no chills somewhat agitated  today.  Physical Exam:  General: Appear in mild distress, no Rash; Oral Mucosa Clear, moist. no Abnormal Neck Mass Or lumps, Conjunctiva normal  Cardiovascular: S1 and S2 Present, no Murmur, Respiratory: good respiratory effort, Bilateral Air entry present and CTA, no Crackles, no wheezes Abdomen: Bowel Sound present, Soft and no tenderness Extremities: no Pedal edema Neurology: alert and oriented to time, place, and person affect appropriate. no new focal deficit Gait not checked due to patient safety concerns  Vitals:   09/07/20 2048 09/08/20 0400 09/08/20 0839 09/08/20 1316  BP: 129/66 122/68 127/61 (!) 125/59  Pulse: 69  72 74  Resp: 17 18 18 18   Temp: (!) 97.5 F (36.4 C) 97.9 F (36.6 C) 98 F (36.7 C) 97.8 F (36.6 C)  TempSrc: Oral Oral Oral Oral  SpO2: 95% 96% 94% 95%  Weight:      Height:        Intake/Output Summary (Last 24 hours) at 09/08/2020 1904 Last data filed at 09/08/2020 1500 Gross per 24 hour  Intake 290 ml  Output 1000 ml  Net -710 ml   Filed Weights   09/02/20 2010  Weight: 62.3 kg    Data Reviewed: I have personally reviewed and interpreted daily labs, tele strips, imaging. I reviewed all nursing notes, pharmacy notes, vitals, pertinent old records I have discussed plan of care as described above with RN and patient/family.  CBC: Recent Labs  Lab 09/02/20 2124 09/03/20 0405 09/05/20 0155 09/06/20 0222 09/07/20 0346 09/07/20 1215 09/08/20 0317  WBC 10.3   < > 8.1 7.8 4.9 5.8 5.5  NEUTROABS 8.7*  --   --   --   --   --   --   HGB 11.8*   < > 8.1* 8.4* 7.7* 8.6* 7.9*  HCT 37.1   < > 24.4* 25.3* 23.1* 25.3* 24.3*  MCV 95.4   < > 94.2 93.4 95.1 93.4 94.2  PLT 151   < > 114* 130* 135* 153 174   < > = values in this interval not displayed.   Basic Metabolic Panel: Recent Labs  Lab 09/04/20 0131 09/05/20 0155 09/06/20 0222 09/07/20 0346 09/08/20 0317  NA 136 137 136 139 139  K 3.5 3.6 3.5 3.4* 3.5  CL 102 102 100 102 105  CO2 25  27 27 27 25   GLUCOSE 128* 115* 112* 82 98  BUN 12 12 12 14 15   CREATININE 0.61 0.58 0.54 0.57 0.52  CALCIUM 8.4* 8.6* 8.7* 8.7* 8.9  MG  --   --  1.4* 1.7 1.6*    Studies: No results found.  Scheduled Meds: . acetaminophen  650 mg Oral Q8H  . apixaban  5 mg Oral BID  . cefdinir  300 mg Oral Q12H  . Chlorhexidine Gluconate Cloth  6 each Topical Daily  . lactulose  20 g Oral BID  . metroNIDAZOLE  500 mg Oral Q8H  . senna-docusate  2 tablet Oral BID  . tamsulosin  0.4 mg Oral Daily   Continuous Infusions:  PRN Meds: menthol-cetylpyridinium **OR** phenol, ondansetron (ZOFRAN) IV, ondansetron **OR** ondansetron (ZOFRAN) IV, traMADol  Time spent: 35 minutes  Author: Berle Mull, MD [facility] 09/08/2020 7:04 PM  To reach On-call, see care teams to locate the attending and reach out via [email or web address]. Between 7PM-7AM, please contact night-coverage If you still have difficulty reaching the attending provider, please page the [facility] (Director on Call) for [facility]  on amion for assistance.

## 2020-09-08 NOTE — Discharge Instructions (Addendum)
Information on my medicine - ELIQUIS (apixaban)  This medication education was reviewed with me or my healthcare representative as part of my discharge preparation.   Why was Eliquis prescribed for you? Eliquis was prescribed for you to reduce the risk of forming blood clots that can cause a stroke if you have a medical condition called atrial fibrillation (a type of irregular heartbeat) OR to reduce the risk of a blood clots forming after orthopedic surgery.  What do You need to know about Eliquis ? Take your Eliquis TWICE DAILY - one tablet in the morning and one tablet in the evening with or without food.  It would be best to take the doses about the same time each day.  If you have difficulty swallowing the tablet whole please discuss with your pharmacist how to take the medication safely.  Take Eliquis exactly as prescribed by your doctor and DO NOT stop taking Eliquis without talking to the doctor who prescribed the medication.  Stopping may increase your risk of developing a new clot or stroke.  Refill your prescription before you run out.  After discharge, you should have regular check-up appointments with your healthcare provider that is prescribing your Eliquis.  In the future your dose may need to be changed if your kidney function or weight changes by a significant amount or as you get older.  What do you do if you miss a dose? If you miss a dose, take it as soon as you remember on the same day and resume taking twice daily.  Do not take more than one dose of ELIQUIS at the same time.  Important Safety Information A possible side effect of Eliquis is bleeding. You should call your healthcare provider right away if you experience any of the following: ? Bleeding from an injury or your nose that does not stop. ? Unusual colored urine (red or dark brown) or unusual colored stools (red or black). ? Unusual bruising for unknown reasons. ? A serious fall or if you hit your head  (even if there is no bleeding).  Some medicines may interact with Eliquis and might increase your risk of bleeding or clotting while on Eliquis. To help avoid this, consult your healthcare provider or pharmacist prior to using any new prescription or non-prescription medications, including herbals, vitamins, non-steroidal anti-inflammatory drugs (NSAIDs) and supplements.  This website has more information on Eliquis (apixaban): [email or web address].    Orthopaedic Trauma Service Discharge Instructions   General Discharge Instructions  Orthopaedic Injuries:  Left hip fracture treated with intramedullary nailing  WEIGHT BEARING STATUS: Weight-bear as tolerated with assistance  RANGE OF MOTION/ACTIVITY: Unrestricted range of motion left hip and knee  Bone health: Vitamin D levels look good however fracture suggestive of osteoporosis.  Continue with supplementation.  Recommend bone density scan 8 weeks.  This fracture is a fragility fracture  Wound Care: Daily wound care as needed.  Okay to leave wounds open to air.  Clean with soap and water only no ointments lotions or solutions.   Discharge Wound Care Instructions  Do NOT apply any ointments, solutions or lotions to pin sites or surgical wounds.  These prevent needed drainage and even though solutions like hydrogen peroxide kill bacteria, they also damage cells lining the pin sites that help fight infection.  Applying lotions or ointments can keep the wounds moist and can cause them to breakdown and open up as well. This can increase the risk for infection. When in doubt call the office.  Surgical  incisions should be dressed daily.  If any drainage is noted, use one layer of adaptic, then gauze, Kerlix, and an ace wrap. Alternatively you can use 4x4 gauze and tape   Once the incision is completely dry and without drainage, it may be left open to air out.  Showering may begin 36-48 hours later.  Cleaning gently with soap and  water.  Traumatic wounds should be dressed daily as well.    One layer of adaptic, gauze, Kerlix, then ace wrap.  The adaptic can be discontinued once the draining has ceased    If you have a wet to dry dressing: wet the gauze with saline the squeeze as much saline out so the gauze is moist (not soaking wet), place moistened gauze over wound, then place a dry gauze over the moist one, followed by Kerlix wrap, then ace wrap.    DVT/PE prophylaxis: Home Eliquis  Diet: as you were eating previously.  Can use over the counter stool softeners and bowel preparations, such as Miralax, to help with bowel movements.  Narcotics can be constipating.  Be sure to drink plenty of fluids  PAIN MEDICATION USE AND EXPECTATIONS  You have likely been given narcotic medications to help control your pain.  After a traumatic event that results in an fracture (broken bone) with or without surgery, it is ok to use narcotic pain medications to help control one's pain.  We understand that everyone responds to pain differently and each individual patient will be evaluated on a regular basis for the continued need for narcotic medications. Ideally, narcotic medication use should last no more than 6-8 weeks (coinciding with fracture healing).   As a patient it is your responsibility as well to monitor narcotic medication use and report the amount and frequency you use these medications when you come to your office visit.   We would also advise that if you are using narcotic medications, you should take a dose prior to therapy to maximize you participation.  IF YOU ARE ON NARCOTIC MEDICATIONS IT IS NOT PERMISSIBLE TO OPERATE A MOTOR VEHICLE (MOTORCYCLE/CAR/TRUCK/MOPED) OR HEAVY MACHINERY DO NOT MIX NARCOTICS WITH OTHER CNS (CENTRAL NERVOUS SYSTEM) DEPRESSANTS SUCH AS ALCOHOL   STOP SMOKING OR USING NICOTINE PRODUCTS!!!!  As discussed nicotine severely impairs your body's ability to heal surgical and traumatic wounds but  also impairs bone healing.  Wounds and bone heal by forming microscopic blood vessels (angiogenesis) and nicotine is a vasoconstrictor (essentially, shrinks blood vessels).  Therefore, if vasoconstriction occurs to these microscopic blood vessels they essentially disappear and are unable to deliver necessary nutrients to the healing tissue.  This is one modifiable factor that you can do to dramatically increase your chances of healing your injury.    (This means no smoking, no nicotine gum, patches, etc)  DO NOT USE NONSTEROIDAL ANTI-INFLAMMATORY DRUGS (NSAID'S)  Using products such as Advil (ibuprofen), Aleve (naproxen), Motrin (ibuprofen) for additional pain control during fracture healing can delay and/or prevent the healing response.  If you would like to take over the counter (OTC) medication, Tylenol (acetaminophen) is ok.  However, some narcotic medications that are given for pain control contain acetaminophen as well. Therefore, you should not exceed more than 4000 mg of tylenol in a day if you do not have liver disease.  Also note that there are may OTC medicines, such as cold medicines and allergy medicines that my contain tylenol as well.  If you have any questions about medications and/or interactions please ask your  doctor/PA or your pharmacist.      ICE AND ELEVATE INJURED/OPERATIVE EXTREMITY  Using ice and elevating the injured extremity above your heart can help with swelling and pain control.  Icing in a pulsatile fashion, such as 20 minutes on and 20 minutes off, can be followed.    Do not place ice directly on skin. Make sure there is a barrier between to skin and the ice pack.    Using frozen items such as frozen peas works well as the conform nicely to the are that needs to be iced.  USE AN ACE WRAP OR TED HOSE FOR SWELLING CONTROL  In addition to icing and elevation, Ace wraps or TED hose are used to help limit and resolve swelling.  It is recommended to use Ace wraps or TED hose  until you are informed to stop.    When using Ace Wraps start the wrapping distally (farthest away from the body) and wrap proximally (closer to the body)   Example: If you had surgery on your leg or thing and you do not have a splint on, start the ace wrap at the toes and work your way up to the thigh        If you had surgery on your upper extremity and do not have a splint on, start the ace wrap at your fingers and work your way up to the upper arm  IF YOU ARE IN A SPLINT OR CAST DO NOT [place]   If your splint gets wet for any reason please contact the office immediately. You may shower in your splint or cast as long as you keep it dry.  This can be done by wrapping in a cast cover or garbage back (or similar)  Do Not stick any thing down your splint or cast such as pencils, money, or hangers to try and scratch yourself with.  If you feel itchy take benadryl as prescribed on the bottle for itching  IF YOU ARE IN A CAM BOOT (BLACK BOOT)  You may remove boot periodically. Perform daily dressing changes as noted below.  Wash the liner of the boot regularly and wear a sock when wearing the boot. It is recommended that you sleep in the boot until told otherwise    Call office for the following:  Temperature greater than 101F  Persistent nausea and vomiting  Severe uncontrolled pain  Redness, tenderness, or signs of infection (pain, swelling, redness, odor or green/yellow discharge around the site)  Difficulty breathing, headache or visual disturbances  Hives  Persistent dizziness or light-headedness  Extreme fatigue  Any other questions or concerns you may have after discharge  In an emergency, call 911 or go to an Emergency Department at a nearby hospital  HELPFUL INFORMATION  ? If you had a block, it will wear off between 8-24 hrs postop typically.  This is period when your pain may go from nearly zero to the pain you would have had postop without the block.   This is an abrupt transition but nothing dangerous is happening.  You may take an extra dose of narcotic when this happens.  ? You should wean off your narcotic medicines as soon as you are able.  Most patients will be off or using minimal narcotics before their first postop appointment.   ? We suggest you use the pain medication the first night prior to going to bed, in order to ease any pain when the anesthesia wears off.  You should avoid taking pain medications on an empty stomach as it will make you nauseous.  ? Do not drink alcoholic beverages or take illicit drugs when taking pain medications.  ? In most states it is against the law to drive while you are in a splint or sling.  And certainly against the law to drive while taking narcotics.  ? You may return to work/school in the next couple of days when you feel up to it.   ? Pain medication may make you constipated.  Below are a few solutions to try in this order: - Decrease the amount of pain medication if you arent having pain. - Drink lots of decaffeinated fluids. - Drink prune juice and/or each dried prunes  o If the first 3 dont work start with additional solutions - Take Colace - an over-the-counter stool softener - Take Senokot - an over-the-counter laxative - Take Miralax - a stronger over-the-counter laxative     CALL THE OFFICE WITH ANY QUESTIONS OR CONCERNS: [phone]   VISIT OUR WEBSITE FOR ADDITIONAL INFORMATION: [email or web address]

## 2020-09-08 NOTE — Care Management Important Message (Signed)
Important Message  Patient Details  Name: [name] MRN: [id number] Date of Birth: [birth year]   Medicare Important Message Given:  Yes     [name] P [name] 09/08/2020, 3:10 PM

## 2020-09-08 NOTE — Plan of Care (Signed)

## 2020-09-08 NOTE — Progress Notes (Signed)
Occupational Therapy Treatment Patient Details Name: [name] MRN: [id number] DOB: 1931-08-11 Today's Date: 09/08/2020    History of present illness 87 y.o. female presents with fall and L hip fx, s/p previous hardware removal with IM nail of L hip ( 09/03/20 ).  PMH: Breats CA, DVT, skin CA, HTN, hypercholesterolemia, OA, and multiple leg fractures in the past.   OT comments  Pt making progress with functional goals, however continues having difficulty word finding throughout session, confused, fixtaed in caling for the nurse but cannot tell why (at one point saying, "I need to get off of this thing, the cartoons"). Pt unable to attempt [facility] transfer dur to agitation and fixation on calling for a nurse for reasons unknown. Pt returned to supine and RN made aware of pt's continue confusion. OT will continue to follow acutely to maximize level of function and safety  Follow Up Recommendations  SNF    Equipment Recommendations  3 in 1 bedside commode;Other (comment) (RW, TBD at next venue of care)    Recommendations for Other Services      Precautions / Restrictions Precautions Precautions: Fall Restrictions Weight Bearing Restrictions: Yes RLE Weight Bearing: Weight bearing as tolerated LLE Weight Bearing: Weight bearing as tolerated       Mobility Bed Mobility Overal bed mobility: Needs Assistance Bed Mobility: Supine to Sit     Supine to sit: Max assist Sit to supine: Total assist   General bed mobility comments: assistance to advance LEs to edge of bed with use of bed rail with BUEs able to pull trunk into sitting unsupported.  Once in sitting max assistance to scoot hips to edge of bed.  Transfers                 General transfer comment: pt unable to follow commands for sequencing, distracted by calling for the nurse for reasons unknown    Balance   Sitting-balance support: Bilateral upper extremity supported;Feet supported Sitting balance-[place]  Scale: Fair Sitting balance - Comments: initially requiring max A to maintain sitting EOB, progressing to min guard A                                   ADL either performed or assessed with clinical judgement   ADL Overall ADL's : Needs assistance/impaired     Grooming: Wash/dry hands;Wash/dry face;Min guard;Sitting   Upper Body Bathing: Minimal assistance;Sitting Upper Body Bathing Details (indicate cue type and reason): simulated     Upper Body Dressing : Minimal assistance;Sitting Upper Body Dressing Details (indicate cue type and reason): donned clean gown                         Vision Baseline Vision/History: Wears glasses Wears Glasses: Reading only Patient Visual Report: No change from baseline     Perception     Praxis      Cognition Arousal/Alertness: Awake/alert Behavior During Therapy: Restless Overall Cognitive Status: Impaired/Different from baseline Area of Impairment: Memory;Following commands;Problem solving                     Memory: Decreased short-term memory Following Commands: Follows one step commands inconsistently;Follows one step commands with increased time     Problem Solving: Difficulty sequencing;Requires verbal cues;Requires tactile cues General Comments: continues having difficulty word finding throughout session, confused, fixtaed in caling for the nurse but cannot tell why (  at one point saying, "I need to get off of this thing, the cartoons")        Exercises     Shoulder Instructions       General Comments      Pertinent Vitals/ Pain       Pain Assessment: Faces Faces Pain Scale: Hurts little more Pain Location: LLE Pain Descriptors / Indicators: Grimacing;Guarding;Moaning Pain Intervention(s): Limited activity within patient's tolerance;Monitored during session;Repositioned  Home Living                                          Prior Functioning/Environment               Frequency  Min 2X/week        Progress Toward Goals  OT Goals(current goals can now be found in the care plan section)  Progress towards OT goals: Progressing toward goals     Plan Discharge plan remains appropriate    Co-evaluation                 AM-PAC OT "6 Clicks" Daily Activity     Outcome Measure   Help from another person eating meals?: None Help from another person taking care of personal grooming?: A Little Help from another person toileting, which includes using toliet, bedpan, or urinal?: Total Help from another person bathing (including washing, rinsing, drying)?: A Lot Help from another person to put on and taking off regular upper body clothing?: A Little Help from another person to put on and taking off regular lower body clothing?: Total 6 Click Score: 14    End of Session    OT Visit Diagnosis: Unsteadiness on feet (R26.81);Other abnormalities of gait and mobility (R26.89);Muscle weakness (generalized) (M62.81);History of falling (Z91.81);Other symptoms and signs involving cognitive function;Pain Pain - Right/Left: Left Pain - part of body: Hip;Leg   Activity Tolerance Patient limited by pain;Patient limited by fatigue;Treatment limited secondary to agitation   Patient Left in bed;with call bell/phone within reach;with bed alarm set   Nurse Communication Other (comment) (RN made aware of pt's continued confusion)        Time: 2542-7062 OT Time Calculation (min): 18 min  Charges: OT General Charges $OT Visit: 1 Visit OT Treatments $Self Care/Home Management : 8-22 mins     Britt Bottom 09/08/2020, 4:55 PM

## 2020-09-08 NOTE — Social Work (Signed)
Multiple calls placed and voicemails left for pt's dtr and spouse requesting return call to discuss current SNF offers and obtain choice. Will continue efforts to reach family.   Wandra Feinstein, MSW, LCSW [phone] (coverage)

## 2020-09-09 LAB — BASIC METABOLIC PANEL
Anion gap: 10 (ref 5–15)
BUN: 14 mg/dL (ref 8–23)
CO2: 23 mmol/L (ref 22–32)
Calcium: 8.8 mg/dL — ABNORMAL LOW (ref 8.9–10.3)
Chloride: 101 mmol/L (ref 98–111)
Creatinine, Ser: 0.55 mg/dL (ref 0.44–1.00)
GFR, Estimated: >60 mL/min (ref >60)
Glucose, Bld: 83 mg/dL (ref 70–99)
Potassium: 3.6 mmol/L (ref 3.5–5.1)
Sodium: 134 mmol/L — ABNORMAL LOW (ref 135–145)

## 2020-09-09 LAB — CBC
HCT: 24.9 % — ABNORMAL LOW (ref 36.0–46.0)
Hemoglobin: 8.2 g/dL — ABNORMAL LOW (ref 12.0–15.0)
MCH: 31.1 pg (ref 26.0–34.0)
MCHC: 32.9 g/dL (ref 30.0–36.0)
MCV: 94.3 fL (ref 80.0–100.0)
Platelets: 187 10*3/uL (ref 150–400)
RBC: 2.64 MIL/uL — ABNORMAL LOW (ref 3.87–5.11)
RDW: 13.6 % (ref 11.5–15.5)
WBC: 6 10*3/uL (ref 4.0–10.5)
nRBC: 0 % (ref 0.0–0.2)

## 2020-09-09 LAB — MAGNESIUM: Magnesium: 1.5 mg/dL — ABNORMAL LOW (ref 1.7–2.4)

## 2020-09-09 MED ORDER — FUROSEMIDE 40 MG PO TABS
40.0000 mg | ORAL_TABLET | ORAL | Status: DC
  Administered 2020-09-09 – 2020-09-11 (×2): 40 mg via ORAL
  Filled 2020-09-09 (×2): qty 1

## 2020-09-09 MED ORDER — LACTULOSE 10 GM/15ML PO SOLN: 20.0000 g | Freq: Two times a day (BID) | ORAL | 0 refills | Status: DC | PRN

## 2020-09-09 MED ORDER — MAGNESIUM OXIDE 400 (241.3 MG) MG PO TABS: 400.0000 mg | ORAL_TABLET | Freq: Two times a day (BID) | ORAL | 0 refills | Status: AC

## 2020-09-09 MED ORDER — TAMSULOSIN HCL 0.4 MG PO CAPS: 0.4000 mg | ORAL_CAPSULE | Freq: Every day | ORAL | Status: DC

## 2020-09-09 MED ORDER — ACETAMINOPHEN 325 MG PO TABS: 650.0000 mg | ORAL_TABLET | Freq: Four times a day (QID) | ORAL | Status: DC | PRN

## 2020-09-09 MED ORDER — ESCITALOPRAM OXALATE 10 MG PO TABS
20.0000 mg | ORAL_TABLET | Freq: Every day | ORAL | Status: DC
  Administered 2020-09-09 – 2020-09-11 (×3): 20 mg via ORAL
  Filled 2020-09-09 (×3): qty 2

## 2020-09-09 MED ORDER — ROSUVASTATIN CALCIUM 20 MG PO TABS
20.0000 mg | ORAL_TABLET | Freq: Every day | ORAL | Status: DC
  Administered 2020-09-09 – 2020-09-11 (×3): 20 mg via ORAL
  Filled 2020-09-09 (×3): qty 1

## 2020-09-09 MED ORDER — LOSARTAN POTASSIUM 25 MG PO TABS: 12.5000 mg | ORAL_TABLET | Freq: Every day | ORAL | Status: DC

## 2020-09-09 MED ORDER — TRAMADOL HCL 50 MG PO TABS: 50.0000 mg | ORAL_TABLET | Freq: Four times a day (QID) | ORAL | 0 refills | Status: AC | PRN

## 2020-09-09 MED ORDER — MAGNESIUM SULFATE 2 GM/50ML IV SOLN
2.0000 g | Freq: Once | INTRAVENOUS | Status: AC
  Administered 2020-09-09: 2 g via INTRAVENOUS
  Filled 2020-09-09: qty 50

## 2020-09-09 MED ORDER — MAGNESIUM OXIDE 400 (241.3 MG) MG PO TABS
400.0000 mg | ORAL_TABLET | Freq: Two times a day (BID) | ORAL | Status: DC
  Administered 2020-09-09 – 2020-09-11 (×6): 400 mg via ORAL
  Filled 2020-09-09 (×6): qty 1

## 2020-09-09 NOTE — Progress Notes (Signed)
PROGRESS NOTE    [name]  QAS:[id number] DOB: [birth year] DOA: 09/02/2020 PCP: Burnard Bunting, MD    Brief Narrative:  87 year old female with history of breast cancer, history of PE and DVT, depression, chronic systolic heart failure, coronary artery disease presented to the ER with fall at home and left hip pain.  She was found to have left intertrochanteric fracture.  Also seen by cardiology for abnormal EKG.   Assessment & Plan:   Principal Problem:   Displaced intertrochanteric fracture of left femur, initial encounter for closed fracture ([place]) Active Problems:   OA (osteoarthritis) of knee   Pure hypercholesterolemia   Essential hypertension, benign   PAT (paroxysmal atrial tachycardia) (H[facility])   Prolonged QT interval   Closed fracture of distal end of left femur, initial encounter ([place])  Displaced intertrochanteric fracture of the left femur, closed and traumatic: Status post IM nailing of the left hip with removal of prior left femoral implant on 2/10 Weightbearing as tolerated Tylenol and tramadol for pain relief Eliquis for DVT prophylaxis Waiting to go to skilled rehab.  Chronic systolic heart failure with potential Takotsubo syndrome, history of coronary artery disease, hyperlipidemia: Known EF 40 to 45%.  Stress test with reversible ischemia in 10/2019 Currently on low-dose ARB, she is on a statin that we will resume.  Reportedly unable to tolerate any beta-blockers.  History of PE DVT, and since anemia: Anticoagulated with Eliquis.  Therapeutic. Given iron IV during hospitalization.  Hypomagnesemia: Persistent.  Replace aggressively.  Pulmonary contusion versus aspiration pneumonia: Symptomatically improving.  Currently on antibiotic, will complete therapy today in the hospital.  Acute urinary retention: Secondary to postop immobility, pain.  Voiding trial 2/15 and was successful.  We will continue Flomax until she has good mobility.  Constipation:  Scheduled lactulose.    DVT prophylaxis: SCDs Start: 09/03/20 1326 SCDs Start: 09/02/20 2146 apixaban (ELIQUIS) tablet 5 mg   Code Status: Full code Family Communication: No family at bedside Disposition Plan: Status is: Inpatient  Remains inpatient appropriate because:Unsafe d/c plan   Dispo:  Patient From: Home  Planned Disposition: Story  Expected discharge date: 09/09/2020  Medically stable for discharge: No           Consultants:   Orthopedics  Procedures:   IM nail left hip 2/10  Antimicrobials:   Omnicef   Subjective: Patient seen and examined.  No overnight events.  "I was mean to the staff yesterday, I am going to be behaving well."  I feel all right.  Objective: Vitals:   09/08/20 1316 09/08/20 2203 09/09/20 0413 09/09/20 0905  BP: (!) 125/59 123/65 117/63 138/71  Pulse: 74 68 62 66  Resp: 18 16 16 20   Temp: 97.8 F (36.6 C) 97.9 F (36.6 C) 97.7 F (36.5 C) 97.9 F (36.6 C)  TempSrc: Oral Oral Oral Oral  SpO2: 95% 94% 94% 94%  Weight:      Height:        Intake/Output Summary (Last 24 hours) at 09/09/2020 1433 Last data filed at 09/08/2020 1930 Gross per 24 hour  Intake 50 ml  Output 300 ml  Net -250 ml   Filed Weights   09/02/20 2010  Weight: 62.3 kg    Examination:  General exam: Appears calm and comfortable  Appropriate for age.  Not in any distress or pain. Respiratory system: Bilateral clear. Cardiovascular system: S1 & S2 heard, RRR.  Gastrointestinal system: Soft.  Nontender. Central nervous system: Alert and oriented. No focal neurological deficits.  Extremities: Symmetric 5 x 5 power. Skin: Left hip lateral incision clean and dry.  Distal neurovascular status intact.    Data Reviewed: I have personally reviewed following labs and imaging studies  CBC: Recent Labs  Lab 09/02/20 2124 09/03/20 0405 09/06/20 0222 09/07/20 0346 09/07/20 1215 09/08/20 0317 09/09/20 0438  WBC 10.3   < > 7.8  4.9 5.8 5.5 6.0  NEUTROABS 8.7*  --   --   --   --   --   --   HGB 11.8*   < > 8.4* 7.7* 8.6* 7.9* 8.2*  HCT 37.1   < > 25.3* 23.1* 25.3* 24.3* 24.9*  MCV 95.4   < > 93.4 95.1 93.4 94.2 94.3  PLT 151   < > 130* 135* 153 174 187   < > = values in this interval not displayed.   Basic Metabolic Panel: Recent Labs  Lab 09/05/20 0155 09/06/20 0222 09/07/20 0346 09/08/20 0317 09/09/20 0438  NA 137 136 139 139 134*  K 3.6 3.5 3.4* 3.5 3.6  CL 102 100 102 105 101  CO2 27 27 27 25 23   GLUCOSE 115* 112* 82 98 83  BUN 12 12 14 15 14   CREATININE 0.58 0.54 0.57 0.52 0.55  CALCIUM 8.6* 8.7* 8.7* 8.9 8.8*  MG  --  1.4* 1.7 1.6* 1.5*   GFR: Estimated Creatinine Clearance: 47.3 mL/min (by C-G formula based on SCr of 0.55 mg/dL). Liver Function Tests: Recent Labs  Lab 09/03/20 0405  AST 29  ALT 30  ALKPHOS 50  BILITOT 1.1  PROT 5.2*  ALBUMIN 2.9*   No results for input(s): LIPASE, AMYLASE in the last 168 hours. No results for input(s): AMMONIA in the last 168 hours. Coagulation Profile: Recent Labs  Lab 09/02/20 2124 09/03/20 0405  INR 1.2 1.2   Cardiac Enzymes: No results for input(s): CKTOTAL, CKMB, CKMBINDEX, TROPONINI in the last 168 hours. BNP (last 3 results) No results for input(s): PROBNP in the last 8760 hours. HbA1C: No results for input(s): HGBA1C in the last 72 hours. CBG: Recent Labs  Lab 09/03/20 0742  GLUCAP 150*   Lipid Profile: No results for input(s): CHOL, HDL, LDLCALC, TRIG, CHOLHDL, LDLDIRECT in the last 72 hours. Thyroid Function Tests: No results for input(s): TSH, T4TOTAL, FREET4, T3FREE, THYROIDAB in the last 72 hours. Anemia Panel: No results for input(s): VITAMINB12, FOLATE, FERRITIN, TIBC, IRON, RETICCTPCT in the last 72 hours. Sepsis Labs: No results for input(s): PROCALCITON, LATICACIDVEN in the last 168 hours.  Recent Results (from the past 240 hour(s))  Resp Panel by RT-PCR (Flu A&B, Covid) Nasopharyngeal Swab     Status: None    Collection Time: 09/02/20  9:42 PM   Specimen: Nasopharyngeal Swab; Nasopharyngeal(NP) swabs in vial transport medium  Result Value Ref Range Status   SARS Coronavirus 2 by RT PCR NEGATIVE NEGATIVE Final    Comment: (NOTE) SARS-CoV-2 target nucleic acids are NOT DETECTED.  The SARS-CoV-2 RNA is generally detectable in upper respiratory specimens during the acute phase of infection. The lowest concentration of SARS-CoV-2 viral copies this assay can detect is 138 copies/mL. A negative result does not preclude SARS-Cov-2 infection and should not be used as the sole basis for treatment or other patient management decisions. A negative result may occur with  improper specimen collection/handling, submission of specimen other than nasopharyngeal swab, presence of viral mutation(s) within the areas targeted by this assay, and inadequate number of viral copies(<138 copies/mL). A negative result must be combined with clinical  observations, patient history, and epidemiological information. The expected result is Negative.  Fact Sheet for Patients:  EntrepreneurPulse.com.au  Fact Sheet for Healthcare Providers:  IncredibleEmployment.be  This test is no t yet approved or cleared by the [place] FDA and  has been authorized for detection and/or diagnosis of SARS-CoV-2 by FDA under an Emergency Use Authorization (EUA). This EUA will remain  in effect (meaning this test can be used) for the duration of the COVID-19 declaration under Section 564(b)(1) of the Act, 21 U.S.C.section 360bbb-3(b)(1), unless the authorization is terminated  or revoked sooner.       Influenza A by PCR NEGATIVE NEGATIVE Final   Influenza B by PCR NEGATIVE NEGATIVE Final    Comment: (NOTE) The Xpert Xpress SARS-CoV-2/FLU/RSV plus assay is intended as an aid in the diagnosis of influenza from Nasopharyngeal swab specimens and should not be used as a sole basis for treatment.  Nasal washings and aspirates are unacceptable for Xpert Xpress SARS-CoV-2/FLU/RSV testing.  Fact Sheet for Patients: EntrepreneurPulse.com.au  Fact Sheet for Healthcare Providers: IncredibleEmployment.be  This test is not yet approved or cleared by the [place] FDA and has been authorized for detection and/or diagnosis of SARS-CoV-2 by FDA under an Emergency Use Authorization (EUA). This EUA will remain in effect (meaning this test can be used) for the duration of the COVID-19 declaration under Section 564(b)(1) of the Act, 21 U.S.C. section 360bbb-3(b)(1), unless the authorization is terminated or revoked.  Performed at [facility], [place] [address], [place], [place] [address]   Surgical PCR screen     Status: None   Collection Time: 09/03/20  4:37 AM   Specimen: Nasal Mucosa; Nasal Swab  Result Value Ref Range Status   MRSA, PCR NEGATIVE NEGATIVE Final   Staphylococcus aureus NEGATIVE NEGATIVE Final    Comment: (NOTE) The Xpert SA Assay (FDA approved for NASAL specimens in patients 50 years of age and older), is one component of a comprehensive surveillance program. It is not intended to diagnose infection nor to guide or monitor treatment. Performed at [facility], [place] [address], [place], [place] [address]          Radiology Studies: No results found.      Scheduled Meds: . acetaminophen  650 mg Oral Q8H  . apixaban  5 mg Oral BID  . cefdinir  300 mg Oral Q12H  . Chlorhexidine Gluconate Cloth  6 each Topical Daily  . escitalopram  20 mg Oral Daily  . furosemide  40 mg Oral Once per day on Mon Wed Fri  . lactulose  20 g Oral BID  . magnesium oxide  400 mg Oral BID  . rosuvastatin  20 mg Oral q1800  . senna-docusate  2 tablet Oral BID  . tamsulosin  0.4 mg Oral Daily   Continuous Infusions:   LOS: 7 days    Time spent: 30 minutes    Barb Merino, MD [facility] Pager [phone]

## 2020-09-09 NOTE — Progress Notes (Signed)
Physical Therapy Treatment Patient Details Name: [name] MRN: [id number] DOB: Mar 19, 1932 Today's Date: 09/09/2020    History of Present Illness 87 y.o. female presents with fall and L hip fx, s/p previous hardware removal with IM nail of L hip ( 09/03/20 ).  PMH: Breats CA, DVT, skin CA, HTN, hypercholesterolemia, OA, and multiple leg fractures in the past.    PT Comments    Patient continues to be limited by impaired cognition. Patient requires overall maxA for bed mobility. Patient performed repeat sit to stands with STEDY and modA. Initiated weight shifting and standing hip flexion to increase WB on LLE with good tolerance by patient. Continue to recommend SNF for ongoing Physical Therapy.       Follow Up Recommendations  SNF;Supervision/Assistance - 24 hour     Equipment Recommendations  Rolling Jaton Eilers with 5" wheels;3in1 (PT);Wheelchair (measurements PT);Wheelchair cushion (measurements PT)    Recommendations for Other Services       Precautions / Restrictions Precautions Precautions: Fall Restrictions Weight Bearing Restrictions: Yes LLE Weight Bearing: Weight bearing as tolerated    Mobility  Bed Mobility Overal bed mobility: Needs Assistance Bed Mobility: Supine to Sit;Sit to Supine     Supine to sit: Mod assist Sit to supine: Max assist   General bed mobility comments: assistance for LLE towards EOB with patient able to elevate trunk to sitting    Transfers Overall transfer level: Needs assistance Equipment used: Ambulation equipment used Transfers: Sit to/from Stand Sit to Stand: Mod assist;From elevated surface         General transfer comment: cues for upright posture and bringing hips forward in standing. Assist for power up into STEDY frame  Ambulation/Gait                 Stairs             Wheelchair Mobility    Modified Rankin (Stroke Patients Only)       Balance Overall balance assessment: Needs  assistance Sitting-balance support: Bilateral upper extremity supported;Feet supported Sitting balance-Leahy Scale: Fair   Postural control: Posterior lean Standing balance support: Bilateral upper extremity supported;During functional activity Standing balance-Leahy Scale: Poor Standing balance comment: assistance to maintain standing in STEDY                            Cognition Arousal/Alertness: Awake/alert Behavior During Therapy: WFL for tasks assessed/performed Overall Cognitive Status: Impaired/Different from baseline Area of Impairment: Memory;Following commands;Problem solving;Orientation                 Orientation Level: Disoriented to;Time   Memory: Decreased short-term memory Following Commands: Follows one step commands inconsistently;Follows one step commands with increased time     Problem Solving: Difficulty sequencing;Requires verbal cues;Requires tactile cues General Comments: A&Ox3. Continues to be confused on where she's at, however states correct place when asked. Fixated on making grocery list for daughter and needing clothing      Exercises Other Exercises Other Exercises: sit to stand x 5 in STEDY with modA Other Exercises: weight shifting L/R to increase Wbing on L LE x 5 mins Other Exercises: standing marching in STEDY x 3, difficulty bearing full weight on LLE    General Comments        Pertinent Vitals/Pain Pain Assessment: Faces Faces Pain Scale: Hurts little more Pain Location: LLE Pain Descriptors / Indicators: Grimacing;Guarding;Moaning Pain Intervention(s): Monitored during session;Repositioned    Home Living  Prior Function            PT Goals (current goals can now be found in the care plan section) Acute Rehab PT Goals Patient Stated Goal: less pain PT Goal Formulation: With patient Time For Goal Achievement: 09/18/20 Potential to Achieve Goals: Fair Progress towards PT goals:  Progressing toward goals    Frequency    Min 3X/week      PT Plan Current plan remains appropriate    Co-evaluation              AM-PAC PT "6 Clicks" Mobility   Outcome Measure  Help needed turning from your back to your side while in a flat bed without using bedrails?: A Lot Help needed moving from lying on your back to sitting on the side of a flat bed without using bedrails?: A Lot Help needed moving to and from a bed to a chair (including a wheelchair)?: A Lot Help needed standing up from a chair using your arms (e.g., wheelchair or bedside chair)?: A Lot Help needed to walk in hospital room?: Total Help needed climbing 3-5 steps with a railing? : Total 6 Click Score: 10    End of Session Equipment Utilized During Treatment: Gait belt Activity Tolerance: Patient tolerated treatment well Patient left: in bed;with call bell/phone within reach;with bed alarm set Nurse Communication: Mobility status;Need for lift equipment PT Visit Diagnosis: Unsteadiness on feet (R26.81);Muscle weakness (generalized) (M62.81);History of falling (Z91.81);Difficulty in walking, not elsewhere classified (R26.2)     Time: 0601-5615 PT Time Calculation (min) (ACUTE ONLY): 25 min  Charges:  $Therapeutic Activity: 23-37 mins                     Fredi Geiler A. Gilford Rile PT, DPT Acute Rehabilitation Services Pager [phone] Office [phone]    Linna Hoff 09/09/2020, 4:32 PM

## 2020-09-10 LAB — CBC
HCT: 25.9 % — ABNORMAL LOW (ref 36.0–46.0)
Hemoglobin: 8.7 g/dL — ABNORMAL LOW (ref 12.0–15.0)
MCH: 31.5 pg (ref 26.0–34.0)
MCHC: 33.6 g/dL (ref 30.0–36.0)
MCV: 93.8 fL (ref 80.0–100.0)
Platelets: 238 10*3/uL (ref 150–400)
RBC: 2.76 MIL/uL — ABNORMAL LOW (ref 3.87–5.11)
RDW: 14.2 % (ref 11.5–15.5)
WBC: 7.5 10*3/uL (ref 4.0–10.5)
nRBC: 0 % (ref 0.0–0.2)

## 2020-09-10 LAB — BASIC METABOLIC PANEL
Anion gap: 9 (ref 5–15)
BUN: 19 mg/dL (ref 8–23)
CO2: 26 mmol/L (ref 22–32)
Calcium: 8.8 mg/dL — ABNORMAL LOW (ref 8.9–10.3)
Chloride: 103 mmol/L (ref 98–111)
Creatinine, Ser: 0.62 mg/dL (ref 0.44–1.00)
GFR, Estimated: >60 mL/min (ref >60)
Glucose, Bld: 104 mg/dL — ABNORMAL HIGH (ref 70–99)
Potassium: 3.6 mmol/L (ref 3.5–5.1)
Sodium: 138 mmol/L (ref 135–145)

## 2020-09-10 LAB — MAGNESIUM: Magnesium: 1.5 mg/dL — ABNORMAL LOW (ref 1.7–2.4)

## 2020-09-10 MED ORDER — MAGNESIUM SULFATE 2 GM/50ML IV SOLN
2.0000 g | Freq: Once | INTRAVENOUS | Status: AC
  Administered 2020-09-10: 2 g via INTRAVENOUS
  Filled 2020-09-10: qty 50

## 2020-09-10 NOTE — Plan of Care (Signed)

## 2020-09-10 NOTE — Progress Notes (Signed)
Occupational Therapy Treatment Patient Details Name: [name] MRN: [id number] DOB: 1932-03-27 Today's Date: 09/10/2020    History of present illness 87 y.o. female presents with fall and L hip fx, s/p previous hardware removal with IM nail of L hip ( 09/03/20 ).  PMH: Breats CA, DVT, skin CA, HTN, hypercholesterolemia, OA, and multiple leg fractures in the past.   OT comments  Pt making progress with functional goals. Pt more alert  today, but still with some confusion. Pt eating lunch in bed upon arrival in unsafe position. Pt with food spilled on her gown she seemed unawareOT assisted pt to EOB for UB dressing and hygiene and to recliner to finish eating her lunch. Pt sit - stand max A with RW, mod A for SPT to reclinerOT will continue to follow acutely to maximize level of function and safety  Follow Up Recommendations  SNF    Equipment Recommendations  3 in 1 bedside commode;Other (comment) (RW, TBD at next venue of care)    Recommendations for Other Services      Precautions / Restrictions Precautions Precautions: Fall Restrictions Weight Bearing Restrictions: Yes RLE Weight Bearing: Weight bearing as tolerated LLE Weight Bearing: Weight bearing as tolerated       Mobility Bed Mobility Overal bed mobility: Needs Assistance Bed Mobility: Supine to Sit     Supine to sit: Mod assist     General bed mobility comments: assistance for LLE towards EOB with patient able to elevate trunk to sitting  Transfers Overall transfer level: Needs assistance Equipment used: Rolling walker (2 wheeled) Transfers: Sit to/from Stand Sit to Stand: Max assist Stand pivot transfers: Mod assist       General transfer comment: cues for upright posture and bringing hips forward in standing.    Balance Overall balance assessment: Needs assistance Sitting-balance support: Bilateral upper extremity supported;Feet supported Sitting balance-Leahy Scale: Fair Sitting balance -  Comments: initially requiring max A to maintain sitting EOB, progressing to min guard A Postural control: Posterior lean Standing balance support: Bilateral upper extremity supported;During functional activity                               ADL either performed or assessed with clinical judgement   ADL Overall ADL's : Needs assistance/impaired Eating/Feeding: Set up;Sitting;Independent   Grooming: Wash/dry hands;Wash/dry face;Min guard;Sitting           Upper Body Dressing : Min guard;Sitting Upper Body Dressing Details (indicate cue type and reason): donned clean gown seated EOB (pt with food spilled on her gown)     Toilet Transfer: Maximal assistance;Moderate assistance;Cueing for safety;Cueing for sequencing;RW;Stand-pivot           Functional mobility during ADLs: Maximal assistance;Moderate assistance;+2 for physical assistance;+2 for safety/equipment;Cueing for safety;Cueing for sequencing;Rolling walker       Vision Baseline Vision/History: Wears glasses Wears Glasses: Reading only Patient Visual Report: No change from baseline     Perception     Praxis      Cognition Arousal/Alertness: Awake/alert Behavior During Therapy: WFL for tasks assessed/performed Overall Cognitive Status: Impaired/Different from baseline Area of Impairment: Memory;Following commands;Problem solving;Orientation                 Orientation Level: Disoriented to;Time   Memory: Decreased short-term memory Following Commands: Follows one step commands inconsistently;Follows one step commands with increased time     Problem Solving: Difficulty sequencing;Requires verbal cues;Requires tactile cues General Comments:  A&Ox3. Continues to be confused, eatin lunch in almost flat position in bed, had not called for assist or use bed controls to elevate [facility]        Exercises     Shoulder Instructions       General Comments      Pertinent Vitals/ Pain       Pain  Assessment: Faces Faces Pain Scale: Hurts little more Pain Location: LLE Pain Descriptors / Indicators: Grimacing;Guarding;Moaning Pain Intervention(s): Monitored during session;Repositioned  Home Living                                          Prior Functioning/Environment              Frequency  Min 2X/week        Progress Toward Goals  OT Goals(current goals can now be found in the care plan section)  Progress towards OT goals: Progressing toward goals     Plan Discharge plan remains appropriate    Co-evaluation                 AM-PAC OT "6 Clicks" Daily Activity     Outcome Measure   Help from another person eating meals?: None Help from another person taking care of personal grooming?: A Little Help from another person toileting, which includes using toliet, bedpan, or urinal?: Total Help from another person bathing (including washing, rinsing, drying)?: A Lot Help from another person to put on and taking off regular upper body clothing?: A Little Help from another person to put on and taking off regular lower body clothing?: Total 6 Click Score: 14    End of Session Equipment Utilized During Treatment: Gait belt;Rolling walker  OT Visit Diagnosis: Unsteadiness on feet (R26.81);Other abnormalities of gait and mobility (R26.89);Muscle weakness (generalized) (M62.81);History of falling (Z91.81);Other symptoms and signs involving cognitive function;Pain Pain - Right/Left: Left Pain - part of body: Hip;Leg   Activity Tolerance Patient limited by pain   Patient Left with call bell/phone within reach;in chair;with chair alarm set   Nurse Communication          Time: 7209-4709 OT Time Calculation (min): 20 min  Charges: OT General Charges $OT Visit: 1 Visit OT Treatments $Self Care/Home Management : 8-22 mins     Britt Bottom 09/10/2020, 2:37 PM

## 2020-09-10 NOTE — Social Work (Signed)
SW provided current SNF offers to pt's husband Alysa Duca [phone] and he plans to research/tour facilities today and will update once decision made. Pt's husband aware plan is for dc tomorrow. [employer] prior auth for SNF remains waived at this time. SW will provide updates at this time.   Wandra Feinstein, MSW, LCSW [phone] (coverage)

## 2020-09-10 NOTE — Progress Notes (Signed)
PROGRESS NOTE    Reneka Nebergall  TKP:[id number] DOB: 02/26/1932 DOA: 09/02/2020 PCP: Burnard Bunting, MD    Brief Narrative:  87 year old female with history of breast cancer, history of PE and DVT, depression, chronic systolic heart failure, coronary artery disease presented to the ER with fall at home and left hip pain.  She was found to have left intertrochanteric fracture.  Also seen by cardiology for abnormal EKG.   Assessment & Plan:   Principal Problem:   Displaced intertrochanteric fracture of left femur, initial encounter for closed fracture ([place]) Active Problems:   OA (osteoarthritis) of knee   Pure hypercholesterolemia   Essential hypertension, benign   PAT (paroxysmal atrial tachycardia) (HCC)   Prolonged QT interval   Closed fracture of distal end of left femur, initial encounter ([place])  Displaced intertrochanteric fracture of the left femur, closed and traumatic: Status post IM nailing of the left hip with removal of prior left femoral implant on 2/10 Weightbearing as tolerated Tylenol and tramadol for pain relief Eliquis for DVT prophylaxis Waiting to go to skilled rehab. Stable.  Chronic systolic heart failure with potential Takotsubo syndrome, history of coronary artery disease, hyperlipidemia: Known EF 40 to 45%.  Stress test with reversible ischemia in 10/2019 Currently on low-dose ARB, she is on statin that we will resume.  Reportedly unable to tolerate any beta-blockers.  History of PE DVT, and chronic anemia: Anticoagulated with Eliquis.  Therapeutic. Given iron IV during hospitalization.  Hypomagnesemia: Persistent.  Replaced, will replace further.  Pulmonary contusion versus aspiration pneumonia: Symptomatically improving.  Completed 5 days of treatment.  Acute urinary retention: Secondary to postop immobility, pain.  Voiding trial 2/15 and was successful.  We will continue Flomax until she has good mobility.  Constipation: Scheduled  lactulose.  DVT prophylaxis: SCDs Start: 09/03/20 1326 SCDs Start: 09/02/20 2146 apixaban (ELIQUIS) tablet 5 mg   Code Status: Full code Family Communication: Husband called and updated. Disposition Plan: Status is: Inpatient  Remains inpatient appropriate because:Unsafe d/c plan   Dispo:  Patient From: Home  Planned Disposition: [facility]  Expected discharge date: when bed available.   Medically stable for discharge: No           Consultants:   Orthopedics  Procedures:   IM nail left hip 2/10  Antimicrobials:   Omnicef   Subjective: Patient seen and examined.  No overnight events.  Had some delirium that has cleared now.  Denies any significant pain or trouble.  Objective: Vitals:   09/09/20 1430 09/09/20 2145 09/10/20 0405 09/10/20 0733  BP: (!) 146/63 (!) 107/54 116/67 (!) 126/55  Pulse: 79 67 70 73  Resp: 20 18 16 15   Temp: 98 F (36.7 C) 98 F (36.7 C) 98.3 F (36.8 C) (!) 97.4 F (36.3 C)  TempSrc: Oral Oral Oral Oral  SpO2: 97% 94% 95% 92%  Weight:      Height:        Intake/Output Summary (Last 24 hours) at 09/10/2020 1116 Last data filed at 09/10/2020 0900 Gross per 24 hour  Intake 390 ml  Output 300 ml  Net 90 ml   Filed Weights   09/02/20 2010  Weight: 62.3 kg    Examination:  General exam: Appears calm and comfortable  Appropriate for age.  Not in any distress or pain. Respiratory system: Bilateral clear. Cardiovascular system: S1 & S2 heard, RRR.  Gastrointestinal system: Soft.  Nontender. Central nervous system: Alert and oriented. No focal neurological deficits. Extremities: Symmetric 5 x  5 power. Skin: Left hip lateral incision clean and dry.  Distal neurovascular status intact.    Data Reviewed: I have personally reviewed following labs and imaging studies  CBC: Recent Labs  Lab 09/07/20 0346 09/07/20 1215 09/08/20 0317 09/09/20 0438 09/10/20 0459  WBC 4.9 5.8 5.5 6.0 7.5  HGB 7.7* 8.6* 7.9*  8.2* 8.7*  HCT 23.1* 25.3* 24.3* 24.9* 25.9*  MCV 95.1 93.4 94.2 94.3 93.8  PLT 135* 153 174 187 627   Basic Metabolic Panel: Recent Labs  Lab 09/06/20 0222 09/07/20 0346 09/08/20 0317 09/09/20 0438 09/10/20 0459  NA 136 139 139 134* 138  K 3.5 3.4* 3.5 3.6 3.6  CL 100 102 105 101 103  CO2 27 27 25 23 26   GLUCOSE 112* 82 98 83 104*  BUN 12 14 15 14 19   CREATININE 0.54 0.57 0.52 0.55 0.62  CALCIUM 8.7* 8.7* 8.9 8.8* 8.8*  MG 1.4* 1.7 1.6* 1.5* 1.5*   GFR: Estimated Creatinine Clearance: 47.3 mL/min (by C-G formula based on SCr of 0.62 mg/dL). Liver Function Tests: No results for input(s): AST, ALT, ALKPHOS, BILITOT, PROT, ALBUMIN in the last 168 hours. No results for input(s): LIPASE, AMYLASE in the last 168 hours. No results for input(s): AMMONIA in the last 168 hours. Coagulation Profile: No results for input(s): INR, PROTIME in the last 168 hours. Cardiac Enzymes: No results for input(s): CKTOTAL, CKMB, CKMBINDEX, TROPONINI in the last 168 hours. BNP (last 3 results) No results for input(s): PROBNP in the last 8760 hours. HbA1C: No results for input(s): HGBA1C in the last 72 hours. CBG: No results for input(s): GLUCAP in the last 168 hours. Lipid Profile: No results for input(s): CHOL, HDL, LDLCALC, TRIG, CHOLHDL, LDLDIRECT in the last 72 hours. Thyroid Function Tests: No results for input(s): TSH, T4TOTAL, FREET4, T3FREE, THYROIDAB in the last 72 hours. Anemia Panel: No results for input(s): VITAMINB12, FOLATE, FERRITIN, TIBC, IRON, RETICCTPCT in the last 72 hours. Sepsis Labs: No results for input(s): PROCALCITON, LATICACIDVEN in the last 168 hours.  Recent Results (from the past 240 hour(s))  Resp Panel by RT-PCR (Flu A&B, Covid) Nasopharyngeal Swab     Status: None   Collection Time: 09/02/20  9:42 PM   Specimen: Nasopharyngeal Swab; Nasopharyngeal(NP) swabs in vial transport medium  Result Value Ref Range Status   SARS Coronavirus 2 by RT PCR NEGATIVE  NEGATIVE Final    Comment: (NOTE) SARS-CoV-2 target nucleic acids are NOT DETECTED.  The SARS-CoV-2 RNA is generally detectable in upper respiratory specimens during the acute phase of infection. The lowest concentration of SARS-CoV-2 viral copies this assay can detect is 138 copies/mL. A negative result does not preclude SARS-Cov-2 infection and should not be used as the sole basis for treatment or other patient management decisions. A negative result may occur with  improper specimen collection/handling, submission of specimen other than nasopharyngeal swab, presence of viral mutation(s) within the areas targeted by this assay, and inadequate number of viral copies(<138 copies/mL). A negative result must be combined with clinical observations, patient history, and epidemiological information. The expected result is Negative.  Fact Sheet for Patients:  EntrepreneurPulse.com.au  Fact Sheet for Healthcare Providers:  IncredibleEmployment.be  This test is no t yet approved or cleared by the [place] FDA and  has been authorized for detection and/or diagnosis of SARS-CoV-2 by FDA under an Emergency Use Authorization (EUA). This EUA will remain  in effect (meaning this test can be used) for the duration of the COVID-19 declaration under Section  564(b)(1) of the Act, 21 U.S.C.section 360bbb-3(b)(1), unless the authorization is terminated  or revoked sooner.       Influenza A by PCR NEGATIVE NEGATIVE Final   Influenza B by PCR NEGATIVE NEGATIVE Final    Comment: (NOTE) The Xpert Xpress SARS-CoV-2/FLU/RSV plus assay is intended as an aid in the diagnosis of influenza from Nasopharyngeal swab specimens and should not be used as a sole basis for treatment. Nasal washings and aspirates are unacceptable for Xpert Xpress SARS-CoV-2/FLU/RSV testing.  Fact Sheet for Patients: EntrepreneurPulse.com.au  Fact Sheet for Healthcare  Providers: IncredibleEmployment.be  This test is not yet approved or cleared by the [place] FDA and has been authorized for detection and/or diagnosis of SARS-CoV-2 by FDA under an Emergency Use Authorization (EUA). This EUA will remain in effect (meaning this test can be used) for the duration of the COVID-19 declaration under Section 564(b)(1) of the Act, 21 U.S.C. section 360bbb-3(b)(1), unless the authorization is terminated or revoked.  Performed at [facility], [place] [address], [place], [place] [address]   Surgical PCR screen     Status: None   Collection Time: 09/03/20  4:37 AM   Specimen: Nasal Mucosa; Nasal Swab  Result Value Ref Range Status   MRSA, PCR NEGATIVE NEGATIVE Final   Staphylococcus aureus NEGATIVE NEGATIVE Final    Comment: (NOTE) The Xpert SA Assay (FDA approved for NASAL specimens in patients 81 years of age and older), is one component of a comprehensive surveillance program. It is not intended to diagnose infection nor to guide or monitor treatment. Performed at [facility], [place] [address], [place], [place] [address]          Radiology Studies: No results found.      Scheduled Meds: . acetaminophen  650 mg Oral Q8H  . apixaban  5 mg Oral BID  . Chlorhexidine Gluconate Cloth  6 each Topical Daily  . escitalopram  20 mg Oral Daily  . furosemide  40 mg Oral Once per day on Mon Wed Fri  . lactulose  20 g Oral BID  . magnesium oxide  400 mg Oral BID  . rosuvastatin  20 mg Oral q1800  . senna-docusate  2 tablet Oral BID  . tamsulosin  0.4 mg Oral Daily   Continuous Infusions: . magnesium sulfate bolus IVPB       LOS: 8 days    Time spent: 30 minutes    Barb Merino, MD [facility] Pager [phone]

## 2020-09-11 DIAGNOSIS — M1712 Unilateral primary osteoarthritis, left knee: Secondary | ICD-10-CM | POA: Diagnosis not present

## 2020-09-11 DIAGNOSIS — Z86711 Personal history of pulmonary embolism: Secondary | ICD-10-CM | POA: Diagnosis not present

## 2020-09-11 DIAGNOSIS — I5022 Chronic systolic (congestive) heart failure: Secondary | ICD-10-CM | POA: Diagnosis not present

## 2020-09-11 DIAGNOSIS — I69828 Other speech and language deficits following other cerebrovascular disease: Secondary | ICD-10-CM | POA: Diagnosis not present

## 2020-09-11 DIAGNOSIS — D649 Anemia, unspecified: Secondary | ICD-10-CM | POA: Diagnosis not present

## 2020-09-11 DIAGNOSIS — M810 Age-related osteoporosis without current pathological fracture: Secondary | ICD-10-CM | POA: Diagnosis not present

## 2020-09-11 DIAGNOSIS — I959 Hypotension, unspecified: Secondary | ICD-10-CM | POA: Diagnosis not present

## 2020-09-11 DIAGNOSIS — T8484XD Pain due to internal orthopedic prosthetic devices, implants and grafts, subsequent encounter: Secondary | ICD-10-CM | POA: Diagnosis not present

## 2020-09-11 DIAGNOSIS — I4581 Long QT syndrome: Secondary | ICD-10-CM | POA: Diagnosis not present

## 2020-09-11 DIAGNOSIS — I10 Essential (primary) hypertension: Secondary | ICD-10-CM | POA: Diagnosis not present

## 2020-09-11 DIAGNOSIS — S79929A Unspecified injury of unspecified thigh, initial encounter: Secondary | ICD-10-CM | POA: Diagnosis not present

## 2020-09-11 DIAGNOSIS — Z723 Lack of physical exercise: Secondary | ICD-10-CM | POA: Diagnosis not present

## 2020-09-11 DIAGNOSIS — I739 Peripheral vascular disease, unspecified: Secondary | ICD-10-CM | POA: Diagnosis not present

## 2020-09-11 DIAGNOSIS — R2681 Unsteadiness on feet: Secondary | ICD-10-CM | POA: Diagnosis not present

## 2020-09-11 DIAGNOSIS — Z7401 Bed confinement status: Secondary | ICD-10-CM | POA: Diagnosis not present

## 2020-09-11 DIAGNOSIS — M2550 Pain in unspecified joint: Secondary | ICD-10-CM | POA: Diagnosis not present

## 2020-09-11 DIAGNOSIS — R41841 Cognitive communication deficit: Secondary | ICD-10-CM | POA: Diagnosis not present

## 2020-09-11 DIAGNOSIS — R1314 Dysphagia, pharyngoesophageal phase: Secondary | ICD-10-CM | POA: Diagnosis not present

## 2020-09-11 DIAGNOSIS — Z9181 History of falling: Secondary | ICD-10-CM | POA: Diagnosis not present

## 2020-09-11 DIAGNOSIS — S72142D Displaced intertrochanteric fracture of left femur, subsequent encounter for closed fracture with routine healing: Secondary | ICD-10-CM | POA: Diagnosis not present

## 2020-09-11 DIAGNOSIS — I471 Supraventricular tachycardia: Secondary | ICD-10-CM | POA: Diagnosis not present

## 2020-09-11 DIAGNOSIS — Z86718 Personal history of other venous thrombosis and embolism: Secondary | ICD-10-CM | POA: Diagnosis not present

## 2020-09-11 DIAGNOSIS — R2689 Other abnormalities of gait and mobility: Secondary | ICD-10-CM | POA: Diagnosis not present

## 2020-09-11 DIAGNOSIS — M1710 Unilateral primary osteoarthritis, unspecified knee: Secondary | ICD-10-CM | POA: Diagnosis not present

## 2020-09-11 DIAGNOSIS — M6281 Muscle weakness (generalized): Secondary | ICD-10-CM | POA: Diagnosis not present

## 2020-09-11 DIAGNOSIS — Z20822 Contact with and (suspected) exposure to covid-19: Secondary | ICD-10-CM | POA: Diagnosis not present

## 2020-09-11 DIAGNOSIS — S72142A Displaced intertrochanteric fracture of left femur, initial encounter for closed fracture: Secondary | ICD-10-CM | POA: Diagnosis not present

## 2020-09-11 LAB — RESP PANEL BY RT-PCR (FLU A&B, COVID) ARPGX2
Influenza A by PCR: NEGATIVE
Influenza B by PCR: NEGATIVE
SARS Coronavirus 2 by RT PCR: NEGATIVE

## 2020-09-11 LAB — MAGNESIUM: Magnesium: 1.9 mg/dL (ref 1.7–2.4)

## 2020-09-11 NOTE — Care Management Important Message (Signed)
Important Message  Patient Details  Name: [name] MRN: [id number] Date of Birth: 02-Feb-1932   Medicare Important Message Given:  Yes     Riane Rung P [employer] 09/11/2020, 1:44 PM

## 2020-09-11 NOTE — TOC Transition Note (Signed)
Transition of Care [facility]) - CM/SW Discharge Note   Patient Details  Name: Verba Ainley MRN: [id number] Date of Birth: [birth year]  Transition of Care [facility]) CM/SW Contact:  Coralee Pesa, [place] Phone Number: 09/11/2020, 3:19 PM   Clinical Narrative:    Nurse to call report to [phone] Rm# [phone]   Final next level of care: [place] Barriers to Discharge: Barriers Resolved   Patient Goals and CMS Choice Patient states their goals for this hospitalization and ongoing recovery are:: "to be able to get out of bed" CMS Medicare.gov Compare Post Acute Care list provided to:: Patient Choice offered to / list presented to : [place]  Discharge Placement              Patient chooses bed at: [facility] Patient to be transferred to facility by: [place] Name of family member notified: Adabelle Griffiths Patient and family notified of of transfer: 09/11/20  Discharge Plan and Services In-house Referral: Clinical Social Work                                   Social Determinants of Health (SDOH) Interventions     Readmission Risk Interventions Readmission Risk Prevention Plan 11/04/2019  Transportation Screening Complete  PCP or Specialist Appt within 5-7 Days Complete  Home Care Screening Complete  Medication Review (RN CM) Complete  Some recent data might be hidden

## 2020-09-11 NOTE — Discharge Summary (Addendum)
Physician Discharge Summary  [name] XTK:[id number] DOB: 1931/11/26 DOA: 09/02/2020  PCP: Burnard Bunting, MD  Admit date: 09/02/2020 Discharge date: 09/11/2020  Admitted From: Home Disposition: Skilled nursing facility  Recommendations for Outpatient Follow-up:  1. Follow up with PCP in 1-2 weeks 2. Please obtain BMP/CBC in one week 3. Orthopedics will schedule follow-up.  Home Health: Not applicable, ongoing dyspnea Equipment/Devices: Not applicable  Discharge Condition: Stable CODE STATUS: Full code Diet recommendation: Low-carb diet  Discharge summary: 87 year old female with history of breast cancer, history of PE and DVT, depression, chronic systolic heart failure, coronary artery disease presented to the ER with fall at home and left hip pain.  She was found to have left intertrochanteric fracture.  Also seen by cardiology for abnormal EKG.   Assessment & Plan of care:   # Displaced intertrochanteric fracture of the left femur, closed and traumatic: Status post IM nailing of the left hip with removal of prior left femoral implant on 2/10 Weightbearing as tolerated Tylenol and tramadol for pain relief Eliquis for DVT prophylaxis Orthopedics to schedule follow-up.  # Chronic systolic heart failure with potential Takotsubo syndrome, history of coronary artery disease, hyperlipidemia: Known EF 40 to 45%.  Stress test with reversible ischemia in 10/2019 Currently on low-dose ARB, she is on statin. Reportedly unable to tolerate any beta-blockers.  #History of PE DVT, and chronic anemia: Anticoagulated with Eliquis.  Therapeutic. Given iron IV during hospitalization.  # Hypomagnesemia: Persistent.  Replaced, will replace further with oral magnesium.  # Pulmonary contusion versus aspiration pneumonia: Symptomatically improving.  Completed 5 days of treatment.  # Acute urinary retention: Secondary to postop immobility, pain.  Voiding trial 2/15 and was  successful.  We will continue Flomax until she has good mobility.  # Constipation: Scheduled lactulose.  Patient is medically stable to transfer to a skilled level of care today.  Addendum: Patient intermittently continue to have difficulty urinating and she was retaining.  She needed in and out catheterization with more than 1.5 L urine drained before discharge. Discussed with skilled nursing rehab staff, they will be able to do in and out catheter. Will order in and out catheterization every 6 hours as needed.  Will avoid Foley.    Discharge Diagnoses:  Principal Problem:   Displaced intertrochanteric fracture of left femur, initial encounter for closed fracture ([place]) Active Problems:   OA (osteoarthritis) of knee   Pure hypercholesterolemia   Essential hypertension, benign   PAT (paroxysmal atrial tachycardia) (HCC)   Prolonged QT interval   Closed fracture of distal end of left femur, initial encounter [facility])    Discharge Instructions  Discharge Instructions    Call MD for:  redness, tenderness, or signs of infection (pain, swelling, redness, odor or green/yellow discharge around incision site)   Complete by: As directed    Call MD for:  redness, tenderness, or signs of infection (pain, swelling, redness, odor or green/yellow discharge around incision site)   Complete by: As directed    Call MD for:  severe uncontrolled pain   Complete by: As directed    Call MD for:  severe uncontrolled pain   Complete by: As directed    Call MD for:  temperature >100.4   Complete by: As directed    Call MD for:  temperature >100.4   Complete by: As directed    Diet - low sodium heart healthy   Complete by: As directed    Increase activity slowly   Complete by: As directed  Increase activity slowly   Complete by: As directed    No dressing needed   Complete by: As directed    No dressing needed   Complete by: As directed    No wound care   Complete by: As directed       Allergies as of 09/11/2020      Reactions   Oysters [shellfish Allergy] Nausea And Vomiting   Codeine Nausea And Vomiting   Penicillins Rash      Medication List    TAKE these medications   acetaminophen 325 MG tablet Commonly known as: TYLENOL Take 2 tablets (650 mg total) by mouth every 6 (six) hours as needed for mild pain or moderate pain. What changed:   medication strength  how much to take  when to take this  reasons to take this   alendronate 70 MG tablet Commonly known as: FOSAMAX Take 70 mg by mouth once a week. Take with a full glass of water on an empty stomach. On sundays   apixaban 5 MG Tabs tablet Commonly known as: ELIQUIS Take 1 tablet (5 mg total) by mouth 2 (two) times daily.   Calcium-D 600-400 MG-UNIT Tabs Take 1 tablet by mouth daily.   escitalopram 20 MG tablet Commonly known as: LEXAPRO Take 20 mg by mouth daily.   furosemide 40 MG tablet Commonly known as: LASIX Take 1 tablet (40 mg total) by mouth 3 (three) times a week.   lactulose 10 GM/15ML solution Commonly known as: CHRONULAC Take 30 mLs (20 g total) by mouth 2 (two) times daily as needed for mild constipation or moderate constipation.   losartan 25 MG tablet Commonly known as: COZAAR Take 0.5 tablets (12.5 mg total) by mouth daily.   magnesium oxide 400 (241.3 Mg) MG tablet Commonly known as: MAG-OX Take 1 tablet (400 mg total) by mouth 2 (two) times daily for 7 days.   Multivitamin Adult Tabs Take 1 tablet by mouth daily.   rosuvastatin 20 MG tablet Commonly known as: CRESTOR Take 1 tablet (20 mg total) by mouth daily at 6 PM.   senna-docusate 8.6-50 MG tablet Commonly known as: Senokot-S Take 1 tablet by mouth at bedtime.   tamsulosin 0.4 MG Caps capsule Commonly known as: FLOMAX Take 1 capsule (0.4 mg total) by mouth daily.   traMADol 50 MG tablet Commonly known as: ULTRAM Take 1 tablet (50 mg total) by mouth every 6 (six) hours as needed for up to 5 days for  moderate pain.            Discharge Care Instructions  (From admission, onward)         Start     Ordered   09/11/20 0000  No dressing needed        09/11/20 1040   09/10/20 0000  No dressing needed        02 /17/22 [place]    Altamese Bolivar, MD. Schedule an appointment as soon as possible for a visit in 10 day(s).   Specialty: Orthopedic Surgery Contact information: [place] [address] [phone]              Allergies  Allergen Reactions  . Oysters [Shellfish Allergy] Nausea And Vomiting  . Codeine Nausea And Vomiting  . Penicillins Rash    Consultations:  Orthopedics   Procedures/Studies: DG Chest 1 View  Result Date: 09/02/2020 CLINICAL DATA:  Pain status post fall EXAM: CHEST  1 VIEW COMPARISON:  10/31/2019 FINDINGS: There are multiple old healed left-sided rib fractures. There is no pneumothorax. No large pleural effusion. The heart size is enlarged. There are prominent interstitial lung markings bilaterally which are essentially unchanged from prior study. There is a probable hiatal hernia. IMPRESSION: Stable chest x-ray with cardiomegaly and chronic lung changes. No acute osseous abnormality is identified on this exam. Electronically Signed   By: Constance Holster M.D.   On: 09/02/2020 21:17   DG Pelvis 1-2 Views  Result Date: 09/02/2020 CLINICAL DATA:  87 year old female with fall and trauma to the left hip. EXAM: PELVIS - 1-2 VIEW COMPARISON:  Pelvic radiograph dated 05/02/2018. FINDINGS: There is a comminuted and displaced fracture of the left femoral neck with involvement of the intertrochanteric region. There is mild proximal migration of the femoral shaft in relation to the head. Advanced osteopenia. No dislocation. Partially visualized right femoral intramedullary nail. The soft tissues are unremarkable. IMPRESSION: Comminuted and displaced fracture of the left femoral neck. Electronically Signed    By: Anner Crete M.D.   On: 09/02/2020 21:14   DG Pelvis Portable  Result Date: 09/03/2020 CLINICAL DATA:  Intertrochanteric left femoral neck fracture. EXAM: PORTABLE PELVIS 1-2 VIEWS COMPARISON:  Earlier the same day and 09/02/2020. FINDINGS: Bones are diffusely demineralized. Status post ORIF for comminuted intertrochanteric left femoral neck fracture with reduction of varus angulation seen previously. Retrograde IM nail in the right femur has been incompletely visualized. Cranial tip of the pre-existing left femoral cortical plate again noted. IMPRESSION: Status post ORIF for comminuted intertrochanteric left femoral neck fracture. Improved alignment with no complication on the visualized anatomy. Electronically Signed   By: Misty Stanley M.D.   On: 09/03/2020 12:27   DG CHEST PORT 1 VIEW  Result Date: 09/04/2020 CLINICAL DATA:  Shortness of breath and productive cough. EXAM: PORTABLE CHEST 1 VIEW COMPARISON:  09/02/2020 FINDINGS: The heart is mildly enlarged but appears stable. Chronic lung changes. Patchy airspace opacity in the right lower lobe could reflect an infiltrate or pulmonary contusion. Numerous healed or healing bilateral rib fractures. IMPRESSION: Right lower lobe infiltrate versus pulmonary contusion. Electronically Signed   By: Marijo Sanes M.D.   On: 09/04/2020 19:27   DG Knee Complete 4 Views Left  Result Date: 09/02/2020 CLINICAL DATA:  Pain EXAM: LEFT KNEE - COMPLETE 4+ VIEW COMPARISON:  X-ray earlier in the same day. FINDINGS: There are advanced tricompartmental degenerative changes of the left knee, greatest within the lateral compartment. There is a small joint effusion without definite evidence for an acute tibial plateau fracture. The visualized orthopedic hardware is intact. Vascular calcifications are noted. There is soft tissue swelling about the knee. IMPRESSION: Advanced tricompartmental degenerative changes of the left knee with a small joint effusion. No definite  evidence for an acute osseous abnormality. Electronically Signed   By: Constance Holster M.D.   On: 09/02/2020 22:01   DG Knee Left Port  Result Date: 09/03/2020 CLINICAL DATA:  Pain EXAM: PORTABLE LEFT KNEE - 1-2 VIEW COMPARISON:  X-ray several hours prior FINDINGS: Examination is limited by patient positioning. Again noted is orthopedic hardware involving the distal femur. There is no evidence for hardware fracture or failure. There are advanced tricompartmental degenerative changes of the knee without definite evidence for an acute displaced fracture or dislocation. There is surrounding soft tissue swelling with a probable small joint effusion. IMPRESSION: Exam limited by patient positioning. No significant interval change. If there is high clinical suspicion for an occult fracture follow-up  with CT is recommended. Electronically Signed   By: Constance Holster M.D.   On: 09/03/2020 00:30   DG Abd Portable 1V  Result Date: 09/05/2020 CLINICAL DATA:  Abdominal distension EXAM: PORTABLE ABDOMEN - 1 VIEW COMPARISON:  May 07, 2018 FINDINGS: Moderate stool present in colon. No bowel dilatation or air-fluid level to suggest bowel obstruction. No free air. Status post multiple kyphoplasty procedures in the lumbar spine. There is bibasilar atelectasis. Postoperative change in proximal left femur. IMPRESSION: No bowel obstruction or free air.  Moderate stool in colon. Electronically Signed   By: Lowella Grip III M.D.   On: 09/05/2020 14:39   DG C-Arm 1-60 Min  Result Date: 09/03/2020 CLINICAL DATA:  ORIF of left femur. EXAM: DG C-ARM 1-60 MIN; LEFT FEMUR 2 VIEWS COMPARISON:  Left femur radiographs 09/02/2020 FLUOROSCOPY TIME:  Fluoroscopy Time:  1 minute 50 seconds Radiation Exposure Index (if provided by the fluoroscopic device): 24.88 mGy FINDINGS: Nine intraoperative spot fluoroscopic images of the left femur are provided. These demonstrate placement of an antegrade intramedullary nail and  proximal and distal interlocking screws for fixation of the previously described intertrochanteric fracture. Remote plate and screw fixation of the distal left femur is again noted. A right knee arthroplasty and right femoral intramedullary nail are also partially visualized. IMPRESSION: Intraoperative images during ORIF of left intertrochanteric femur fracture. Electronically Signed   By: Logan Bores M.D.   On: 09/03/2020 11:14   ECHOCARDIOGRAM COMPLETE  Result Date: 09/04/2020    ECHOCARDIOGRAM REPORT   Patient Name:   [name] Date of Exam: 09/04/2020 Medical Rec #:  [id number]            Height:       66.0 in Accession #:    [id number]           Weight:       137.3 lb Date of Birth:  May 02, 1932           BSA:          1.705 m Patient Age:    80 years             BP:           105/46 mmHg Patient Gender: F                    HR:           79 bpm. Exam Location:  Inpatient Procedure: 2D Echo, Cardiac Doppler and Color Doppler                                 MODIFIED REPORT:   This report was modified by Lyman Bishop MD on 09/04/2020 due to Updated RV                                   information.  Indications:     CHF  History:         Patient has prior history of Echocardiogram examinations, most                  recent 11/02/2019. CHF, CAD; Risk Factors:Hypertension. H/O DVT.  Sonographer:     Clayton Lefort RDCS (AE) Referring Phys:  [place] Diagnosing Phys: Lyman Bishop MD IMPRESSIONS  1. Left ventricular ejection fraction, by estimation, is 45 to 50%. The left  ventricle has mildly decreased function. The left ventricle demonstrates regional wall motion abnormalities (see scoring diagram/findings for description). There is severe asymmetric left ventricular hypertrophy of the basal-septal segment. Left ventricular diastolic parameters are consistent with Grade I diastolic dysfunction (impaired relaxation). There is moderate hypokinesis of the left ventricular, basal-mid inferolateral wall.   2. Right ventricular systolic function is mildly reduced. The right ventricular size is mildly enlarged. There is moderately elevated pulmonary artery systolic pressure. The estimated right ventricular systolic pressure is 94.8 mmHg.  3. Left atrial size was severely dilated.  4. The pericardial effusion is posterior to the left ventricle.  5. The mitral valve is abnormal. Mild mitral valve regurgitation.  6. Tricuspid valve regurgitation is mild to moderate.  7. The aortic valve was not well visualized. Aortic valve regurgitation is mild. Mild aortic valve stenosis. Aortic regurgitation PHT measures 591 msec. Aortic valve area, by VTI measures 2.06 cm. Aortic valve mean gradient measures 7.0 mmHg. Aortic valve Vmax measures 1.82 m/s.  8. Aortic dilatation noted. There is borderline dilatation of the ascending aorta, measuring 39 mm.  9. The inferior vena cava is dilated in size with <50% respiratory variability, suggesting right atrial pressure of 15 mmHg. Comparison(s): Changes from prior study are noted. 11/02/2019: LVEF 40-45%, global hypokinesis, RVSP 43 mmHg, moderate RV dysfunction. FINDINGS  Left Ventricle: Left ventricular ejection fraction, by estimation, is 45 to 50%. The left ventricle has mildly decreased function. The left ventricle demonstrates regional wall motion abnormalities. Moderate hypokinesis of the left ventricular, basal-mid inferolateral wall. The left ventricular internal cavity size was normal in size. There is severe asymmetric left ventricular hypertrophy of the basal-septal segment. Left ventricular diastolic parameters are consistent with Grade I diastolic dysfunction (impaired relaxation). Indeterminate filling pressures. Right Ventricle: The right ventricular size is mildly enlarged. No increase in right ventricular wall thickness. Right ventricular systolic function is mildly reduced. There is moderately elevated pulmonary artery systolic pressure. The tricuspid regurgitant  velocity is 2.96 m/s, and with an assumed right atrial pressure of 15 mmHg, the estimated right ventricular systolic pressure is 54.6 mmHg. Left Atrium: Left atrial size was severely dilated. Right Atrium: Right atrial size was normal in size. Pericardium: Trivial pericardial effusion is present. The pericardial effusion is posterior to the left ventricle. Mitral Valve: The mitral valve is abnormal. There is moderate thickening of the mitral valve leaflet(s). There is moderate calcification of the mitral valve leaflet(s). Mild mitral valve regurgitation. MV peak gradient, 9.9 mmHg. The mean mitral valve gradient is 3.0 mmHg. Tricuspid Valve: The tricuspid valve is grossly normal. Tricuspid valve regurgitation is mild to moderate. Aortic Valve: The aortic valve was not well visualized. Aortic valve regurgitation is mild. Aortic regurgitation PHT measures 591 msec. Mild aortic stenosis is present. Aortic valve mean gradient measures 7.0 mmHg. Aortic valve peak gradient measures 13.2 mmHg. Aortic valve area, by VTI measures 2.06 cm. Pulmonic Valve: The pulmonic valve was grossly normal. Pulmonic valve regurgitation is not visualized. Aorta: Aortic dilatation noted. There is borderline dilatation of the ascending aorta, measuring 39 mm. Venous: The inferior vena cava is dilated in size with less than 50% respiratory variability, suggesting right atrial pressure of 15 mmHg. IAS/Shunts: No atrial level shunt detected by color flow Doppler.  LEFT VENTRICLE PLAX 2D LVIDd:         4.30 cm  Diastology LVIDs:         3.30 cm  LV e' medial:    5.66 cm/s LV PW:  1.40 cm  LV E/e' medial:  14.7 LV IVS:        1.80 cm  LV e' lateral:   10.90 cm/s LVOT diam:     2.00 cm  LV E/e' lateral: 7.6 LV SV:         80 LV SV Index:   47 LVOT Area:     3.14 cm  RIGHT VENTRICLE          IVC RV Basal diam:  3.40 cm  IVC diam: 2.30 cm LEFT ATRIUM             Index       RIGHT ATRIUM           Index LA diam:        3.50 cm 2.05 cm/m  RA  Area:     18.10 cm LA Vol (A2C):   70.3 ml 41.24 ml/m RA Volume:   42.60 ml  24.99 ml/m LA Vol (A4C):   87.6 ml 51.39 ml/m LA Biplane Vol: 81.7 ml 47.93 ml/m  AORTIC VALVE AV Area (Vmax):    2.19 cm AV Area (Vmean):   1.87 cm AV Area (VTI):     2.06 cm AV Vmax:           182.00 cm/s AV Vmean:          122.000 cm/s AV VTI:            0.389 m AV Peak Grad:      13.2 mmHg AV Mean Grad:      7.0 mmHg LVOT Vmax:         127.00 cm/s LVOT Vmean:        72.800 cm/s LVOT VTI:          0.255 m LVOT/AV VTI ratio: 0.66 AI PHT:            591 msec  AORTA Ao Root diam: 3.20 cm Ao Asc diam:  3.80 cm MITRAL VALVE                TRICUSPID VALVE MV Area (PHT): 3.72 cm     TR Peak grad:   35.0 mmHg MV Area VTI:   2.38 cm     TR Vmax:        296.00 cm/s MV Peak grad:  9.9 mmHg MV Mean grad:  3.0 mmHg     SHUNTS MV Vmax:       1.57 m/s     Systemic VTI:  0.26 m MV Vmean:      79.8 cm/s    Systemic Diam: 2.00 cm MV Decel Time: 204 msec MV E velocity: 83.20 cm/s MV A velocity: 114.00 cm/s MV E/A ratio:  0.73 Lyman Bishop MD Electronically signed by Lyman Bishop MD Signature Date/Time: 09/04/2020/10:47:28 AM    Final (Updated)    DG FEMUR MIN 2 VIEWS LEFT  Result Date: 09/03/2020 CLINICAL DATA:  ORIF of left femur. EXAM: DG C-ARM 1-60 MIN; LEFT FEMUR 2 VIEWS COMPARISON:  Left femur radiographs 09/02/2020 FLUOROSCOPY TIME:  Fluoroscopy Time:  1 minute 50 seconds Radiation Exposure Index (if provided by the fluoroscopic device): 24.88 mGy FINDINGS: Nine intraoperative spot fluoroscopic images of the left femur are provided. These demonstrate placement of an antegrade intramedullary nail and proximal and distal interlocking screws for fixation of the previously described intertrochanteric fracture. Remote plate and screw fixation of the distal left femur is again noted. A right knee arthroplasty and right femoral intramedullary nail are also partially visualized.  IMPRESSION: Intraoperative images during ORIF of left  intertrochanteric femur fracture. Electronically Signed   By: Logan Bores M.D.   On: 09/03/2020 11:14   DG Femur Min 2 Views Left  Result Date: 09/02/2020 CLINICAL DATA:  Pain EXAM: LEFT FEMUR 2 VIEWS COMPARISON:  None. FINDINGS: There is an acute, comminuted and displaced intratrochanteric fracture of the proximal left femur. The patient has undergone prior plate screw fixation of the mid to distal femur. There is an old healed fracture of the distal left femur. The hardware is intact. There are end-stage degenerative changes of the left knee. There is an apparent age-indeterminate lateral tibial plateau fracture. There is soft tissue swelling about the knee. There is a small joint effusion. IMPRESSION: 1. Acute comminuted and displaced intratrochanteric fracture of the proximal left femur. 2. Age-indeterminate lateral tibial plateau fracture. Dedicated knee radiographs are recommended. 3. Old healed fracture of the distal left femur after plate screw fixation. 4. End-stage degenerative changes of the left knee. Electronically Signed   By: Constance Holster M.D.   On: 09/02/2020 21:15   DG FEMUR PORT MIN 2 VIEWS LEFT  Result Date: 09/03/2020 CLINICAL DATA:  ORIF for comminuted intertrochanteric femoral neck fracture. EXAM: LEFT FEMUR PORTABLE 2 VIEWS COMPARISON:  Earlier same day and 09/02/2020 FINDINGS: Status post long antegrade IM nail placement with dynamic screw transfixing the femoral neck fracture. Interval reduction of varus angulation seen preoperatively. No hardware complication. Pre-existing lateral plate and screw fixator again noted. IMPRESSION: Status post ORIF for femoral neck fracture. No evidence for immediate hardware complication. Electronically Signed   By: Misty Stanley M.D.   On: 09/03/2020 12:29   (Echo, Carotid, EGD, Colonoscopy, ERCP)    Subjective: Patient seen and examined.  No overnight events.  Pain is controlled.  She is looking forward to go to rehab and go back  home.   Discharge Exam: Vitals:   09/11/20 0428 09/11/20 0742  BP: 114/60 108/62  Pulse: 62 71  Resp: 16 16  Temp: 98.3 F (36.8 C) 98.4 F (36.9 C)  SpO2: 94% 91%   Vitals:   09/10/20 2018 09/11/20 0428 09/11/20 0428 09/11/20 0742  BP: 120/64 114/60 114/60 108/62  Pulse: 70 62 62 71  Resp: 18 18 16 16   Temp: 98.3 F (36.8 C) 98.3 F (36.8 C) 98.3 F (36.8 C) 98.4 F (36.9 C)  TempSrc: Oral Oral Oral Oral  SpO2: 95% 94% 94% 91%  Weight:      Height:        General: Pt is alert, awake, not in acute distress Cardiovascular: RRR, S1/S2 +, no rubs, no gallops Respiratory: CTA bilaterally, no wheezing, no rhonchi Abdominal: Soft, NT, ND, bowel sounds + Extremities: no edema, no cyanosis Left lateral hip incisions are dry and clean.  Distal neurovascular status intact.    The results of significant diagnostics from this hospitalization (including imaging, microbiology, ancillary and laboratory) are listed below for reference.     Microbiology: Recent Results (from the past 240 hour(s))  Resp Panel by RT-PCR (Flu A&B, Covid) Nasopharyngeal Swab     Status: None   Collection Time: 09/02/20  9:42 PM   Specimen: Nasopharyngeal Swab; Nasopharyngeal(NP) swabs in vial transport medium  Result Value Ref Range Status   SARS Coronavirus 2 by RT PCR NEGATIVE NEGATIVE Final    Comment: (NOTE) SARS-CoV-2 target nucleic acids are NOT DETECTED.  The SARS-CoV-2 RNA is generally detectable in upper respiratory specimens during the acute phase of infection. The lowest concentration of  SARS-CoV-2 viral copies this assay can detect is 138 copies/mL. A negative result does not preclude SARS-Cov-2 infection and should not be used as the sole basis for treatment or other patient management decisions. A negative result may occur with  improper specimen collection/handling, submission of specimen other than nasopharyngeal swab, presence of viral mutation(s) within the areas targeted by  this assay, and inadequate number of viral copies(<138 copies/mL). A negative result must be combined with clinical observations, patient history, and epidemiological information. The expected result is Negative.  Fact Sheet for Patients:  EntrepreneurPulse.com.au  Fact Sheet for Healthcare Providers:  IncredibleEmployment.be  This test is no t yet approved or cleared by the [place] FDA and  has been authorized for detection and/or diagnosis of SARS-CoV-2 by FDA under an Emergency Use Authorization (EUA). This EUA will remain  in effect (meaning this test can be used) for the duration of the COVID-19 declaration under Section 564(b)(1) of the Act, 21 U.S.C.section 360bbb-3(b)(1), unless the authorization is terminated  or revoked sooner.       Influenza A by PCR NEGATIVE NEGATIVE Final   Influenza B by PCR NEGATIVE NEGATIVE Final    Comment: (NOTE) The Xpert Xpress SARS-CoV-2/FLU/RSV plus assay is intended as an aid in the diagnosis of influenza from Nasopharyngeal swab specimens and should not be used as a sole basis for treatment. Nasal washings and aspirates are unacceptable for Xpert Xpress SARS-CoV-2/FLU/RSV testing.  Fact Sheet for Patients: EntrepreneurPulse.com.au  Fact Sheet for Healthcare Providers: IncredibleEmployment.be  This test is not yet approved or cleared by the [place] FDA and has been authorized for detection and/or diagnosis of SARS-CoV-2 by FDA under an Emergency Use Authorization (EUA). This EUA will remain in effect (meaning this test can be used) for the duration of the COVID-19 declaration under Section 564(b)(1) of the Act, 21 U.S.C. section 360bbb-3(b)(1), unless the authorization is terminated or revoked.  Performed at [facility], [place] [address] [place], [place] [address]   Surgical PCR screen     Status: None   Collection Time: 09/03/20  4:37 AM    Specimen: Nasal Mucosa; Nasal Swab  Result Value Ref Range Status   MRSA, PCR NEGATIVE NEGATIVE Final   Staphylococcus aureus NEGATIVE NEGATIVE Final    Comment: (NOTE) The Xpert SA Assay (FDA approved for NASAL specimens in patients 43 years of age and older), is one component of a comprehensive surveillance program. It is not intended to diagnose infection nor to guide or monitor treatment. Performed at [facility], [place] [address], [place], [place] [address]      Labs: BNP (last 3 results) No results for input(s): BNP in the last 8760 hours. Basic Metabolic Panel: Recent Labs  Lab 09/06/20 0222 09/07/20 0346 09/08/20 0317 09/09/20 0438 09/10/20 0459 09/11/20 0119  NA 136 139 139 134* 138  --   K 3.5 3.4* 3.5 3.6 3.6  --   CL 100 102 105 101 103  --   CO2 27 27 25 23 26   --   GLUCOSE 112* 82 98 83 104*  --   BUN 12 14 15 14 19   --   CREATININE 0.54 0.57 0.52 0.55 0.62  --   CALCIUM 8.7* 8.7* 8.9 8.8* 8.8*  --   MG 1.4* 1.7 1.6* 1.5* 1.5* 1.9   Liver Function Tests: No results for input(s): AST, ALT, ALKPHOS, BILITOT, PROT, ALBUMIN in the last 168 hours. No results for input(s): LIPASE, AMYLASE in the last 168  hours. No results for input(s): AMMONIA in the last 168 hours. CBC: Recent Labs  Lab 09/07/20 0346 09/07/20 1215 09/08/20 0317 09/09/20 0438 09/10/20 0459  WBC 4.9 5.8 5.5 6.0 7.5  HGB 7.7* 8.6* 7.9* 8.2* 8.7*  HCT 23.1* 25.3* 24.3* 24.9* 25.9*  MCV 95.1 93.4 94.2 94.3 93.8  PLT 135* 153 174 187 238   Cardiac Enzymes: No results for input(s): CKTOTAL, CKMB, CKMBINDEX, TROPONINI in the last 168 hours. BNP: Invalid input(s): POCBNP CBG: No results for input(s): GLUCAP in the last 168 hours. D-Dimer No results for input(s): DDIMER in the last 72 hours. Hgb A1c No results for input(s): HGBA1C in the last 72 hours. Lipid Profile No results for input(s): CHOL, HDL, LDLCALC, TRIG, CHOLHDL, LDLDIRECT in the last 72 hours. Thyroid function  studies No results for input(s): TSH, T4TOTAL, T3FREE, THYROIDAB in the last 72 hours.  Invalid input(s): FREET3 Anemia work up No results for input(s): VITAMINB12, FOLATE, FERRITIN, TIBC, IRON, RETICCTPCT in the last 72 hours. Urinalysis    Component Value Date/Time   COLORURINE YELLOW 10/19/2014 1450   APPEARANCEUR CLEAR 10/19/2014 1450   LABSPEC 1.044 (H) 10/19/2014 1450   PHURINE 7.5 10/19/2014 1450   GLUCOSEU NEGATIVE 10/19/2014 1450   HGBUR NEGATIVE 10/19/2014 1450   BILIRUBINUR NEGATIVE 10/19/2014 1450   KETONESUR NEGATIVE 10/19/2014 1450   PROTEINUR NEGATIVE 10/19/2014 1450   UROBILINOGEN 1.0 10/19/2014 1450   NITRITE NEGATIVE 10/19/2014 1450   LEUKOCYTESUR SMALL (A) 10/19/2014 1450   Sepsis Labs Invalid input(s): PROCALCITONIN,  WBC,  LACTICIDVEN Microbiology Recent Results (from the past 240 hour(s))  Resp Panel by RT-PCR (Flu A&B, Covid) Nasopharyngeal Swab     Status: None   Collection Time: 09/02/20  9:42 PM   Specimen: Nasopharyngeal Swab; Nasopharyngeal(NP) swabs in vial transport medium  Result Value Ref Range Status   SARS Coronavirus 2 by RT PCR NEGATIVE NEGATIVE Final    Comment: (NOTE) SARS-CoV-2 target nucleic acids are NOT DETECTED.  The SARS-CoV-2 RNA is generally detectable in upper respiratory specimens during the acute phase of infection. The lowest concentration of SARS-CoV-2 viral copies this assay can detect is 138 copies/mL. A negative result does not preclude SARS-Cov-2 infection and should not be used as the sole basis for treatment or other patient management decisions. A negative result may occur with  improper specimen collection/handling, submission of specimen other than nasopharyngeal swab, presence of viral mutation(s) within the areas targeted by this assay, and inadequate number of viral copies(<138 copies/mL). A negative result must be combined with clinical observations, patient history, and epidemiological information. The  expected result is Negative.  Fact Sheet for Patients:  EntrepreneurPulse.com.au  Fact Sheet for Healthcare Providers:  IncredibleEmployment.be  This test is no t yet approved or cleared by the [place] FDA and  has been authorized for detection and/or diagnosis of SARS-CoV-2 by FDA under an Emergency Use Authorization (EUA). This EUA will remain  in effect (meaning this test can be used) for the duration of the COVID-19 declaration under Section 564(b)(1) of the Act, 21 U.S.C.section 360bbb-3(b)(1), unless the authorization is terminated  or revoked sooner.       Influenza A by PCR NEGATIVE NEGATIVE Final   Influenza B by PCR NEGATIVE NEGATIVE Final    Comment: (NOTE) The Xpert Xpress SARS-CoV-2/FLU/RSV plus assay is intended as an aid in the diagnosis of influenza from Nasopharyngeal swab specimens and should not be used as a sole basis for treatment. Nasal washings and aspirates are unacceptable for Xpert Xpress  SARS-CoV-2/FLU/RSV testing.  Fact Sheet for Patients: EntrepreneurPulse.com.au  Fact Sheet for Healthcare Providers: IncredibleEmployment.be  This test is not yet approved or cleared by the [place] FDA and has been authorized for detection and/or diagnosis of SARS-CoV-2 by FDA under an Emergency Use Authorization (EUA). This EUA will remain in effect (meaning this test can be used) for the duration of the COVID-19 declaration under Section 564(b)(1) of the Act, 21 U.S.C. section 360bbb-3(b)(1), unless the authorization is terminated or revoked.  Performed at [facility], [place] [address], [place], [place] [address]   Surgical PCR screen     Status: None   Collection Time: 09/03/20  4:37 AM   Specimen: Nasal Mucosa; Nasal Swab  Result Value Ref Range Status   MRSA, PCR NEGATIVE NEGATIVE Final   Staphylococcus aureus NEGATIVE NEGATIVE Final    Comment: (NOTE) The Xpert SA  Assay (FDA approved for NASAL specimens in patients 64 years of age and older), is one component of a comprehensive surveillance program. It is not intended to diagnose infection nor to guide or monitor treatment. Performed at [facility], [place] [address], [place], [place] [address]      Time coordinating discharge:  35 minutes  SIGNED:   Barb Merino, MD  [facility] 09/11/2020, 10:47 AM

## 2020-09-12 NOTE — Progress Notes (Signed)
Patient discharged to [facility] via [facility] . Evening medications given. All belongings and discharge papers transported with patient.

## 2020-09-15 ENCOUNTER — Other Ambulatory Visit: Payer: Medicare Other

## 2020-09-15 ENCOUNTER — Non-Acute Institutional Stay (SKILLED_NURSING_FACILITY): Payer: Medicare Other | Admitting: Internal Medicine

## 2020-09-15 ENCOUNTER — Encounter: Payer: Self-pay | Admitting: Internal Medicine

## 2020-09-15 DIAGNOSIS — I5022 Chronic systolic (congestive) heart failure: Secondary | ICD-10-CM | POA: Insufficient documentation

## 2020-09-15 DIAGNOSIS — F325 Major depressive disorder, single episode, in full remission: Secondary | ICD-10-CM

## 2020-09-15 DIAGNOSIS — Z86711 Personal history of pulmonary embolism: Secondary | ICD-10-CM | POA: Diagnosis not present

## 2020-09-15 DIAGNOSIS — R338 Other retention of urine: Secondary | ICD-10-CM

## 2020-09-15 DIAGNOSIS — I2510 Atherosclerotic heart disease of native coronary artery without angina pectoris: Secondary | ICD-10-CM | POA: Insufficient documentation

## 2020-09-15 DIAGNOSIS — K5903 Drug induced constipation: Secondary | ICD-10-CM

## 2020-09-15 DIAGNOSIS — S72142A Displaced intertrochanteric fracture of left femur, initial encounter for closed fracture: Secondary | ICD-10-CM

## 2020-09-15 DIAGNOSIS — D62 Acute posthemorrhagic anemia: Secondary | ICD-10-CM

## 2020-09-15 DIAGNOSIS — Z86718 Personal history of other venous thrombosis and embolism: Secondary | ICD-10-CM | POA: Diagnosis not present

## 2020-09-15 LAB — CBC AND DIFFERENTIAL
HCT: 26 — AB (ref 36–46)
Hemoglobin: 9 — AB (ref 12.0–16.0)
Platelets: 386 (ref 150–399)
WBC: 8.4

## 2020-09-15 LAB — BASIC METABOLIC PANEL
BUN: 31 — AB (ref 4–21)
CO2: 29 — AB (ref 13–22)
Chloride: 95 — AB (ref 99–108)
Creatinine: 0.7 (ref 0.5–1.1)
Glucose: 100
Potassium: 4.2 (ref 3.4–5.3)
Sodium: 134 — AB (ref 137–147)

## 2020-09-15 LAB — COMPREHENSIVE METABOLIC PANEL
Calcium: 9.6 (ref 8.7–10.7)
GFR calc Af Amer: >90
GFR calc non Af Amer: 79.66

## 2020-09-15 LAB — CBC: RBC: 2.73 — AB (ref 3.87–5.11)

## 2020-09-15 NOTE — Progress Notes (Signed)
Provider:  Rexene Edison. Mariea Clonts, D.O., C.M.D. Location:  [facility] Room Number: 267-T Place of Service:  SNF (31)  PCP: Burnard Bunting, MD Patient Care Team: Burnard Bunting, MD as PCP - General (Internal Medicine) Sanda Klein, MD as PCP - Cardiology (Cardiology) Altamese [place], MD as Consulting Physician (Orthopedic Surgery)  Extended Emergency Contact Information Primary Emergency Contact: Alfredo Batty Address: [address]          [place], [place] [address] Johnnette Litter of [place] Phone: [phone] Work Phone: [phone] Relation: Spouse Secondary Emergency Contact: Mosetta Pigeon [place] Phone: [phone] Mobile Phone: [phone] Relation: Daughter  Code Status: FULL CODE Goals of Care: Advanced Directive information Advanced Directives 09/15/2020  Does Patient Have a Medical Advance Directive? No  Type of Advance Directive -  Does patient want to make changes to medical advance directive? -  Copy of [place] in Chart? -  Would patient like information on creating a medical advance directive? No - Patient declined  Pre-existing out of facility DNR order (yellow form or pink MOST form) -      Chief Complaint  Patient presents with  . New Admit To SNF    New Admission    HPI: Patient is a 87 y.o. female with h/o breast ca, prior DVT and PE, chronic systolic chf, CAD and depression seen today for admission to [facility] status post fall with closed left hip fracture.  Sustained a displaced intertrochanteric left hip fracture.  Dr. Marcelino Scot performed on inner medullary nail and removal of the left femoral implant on February 10.  She comes to [place] with pain management including Tylenol and tramadol and Eliquis DVT prophylaxis (also for her chronic DVT and PE).  During her stay she received IV iron for her acute on chronic anemia.  Her magnesium was repleted.  She had 5 days  of antibiotics for pneumonia versus contusion noted on her chest x-ray.  She had urinary retention but Foley was able to be DC'd on February 15 after she had a successful voiding trial.  She remains on Fosamax until she has better mobility.  Her bowel regimen includes lactulose.  She had her Pfizer Covid vaccines on February 5 and February 26 and her booster on September 29.  When seen, she reported minimal remaining pain in her hip and is using tylenol rather than tramadol at this point.  She's doing well with therapy.  Bowels are moving with lactulose.  She did not c/o cough or congestion.  Past Medical History:  Diagnosis Date  . Arthritis    "in my back"  . Breast cancer ([facility]) 1996   left breast cancer   . Complication of anesthesia 04/18/2012   "didn't tolerate it today very well; had the shakes and very hard time w/it"  . Depression 11/20/2012  . DVT (deep venous thrombosis) ([facility])   . High cholesterol   . History of alcoholism ([facility])    7 1/2 years clean  . Hypertension   . Melanoma ([facility]) 01/15/2004   upper right arm  . Osteoporosis   . Peripheral vascular disease (H[facility])    hx of ligation   . Personal history of radiation therapy 1993   Left Breast Cancer  . SCC (squamous cell carcinoma) 01/15/2004   left post upper arm, Right elbow, post left knee  . SCC (squamous cell carcinoma) 05/21/2013   right forearm, right jawline, left cheek  . SCC (  squamous cell carcinoma) 08/24/2016   right shin,right thigh  . SCC (squamous cell carcinoma) 11/01/2016  . SCC (squamous cell carcinoma) 02/02/2018   left outer arm,right outer thigh  . SCC (squamous cell carcinoma) 12/13/2018   right jawline  . Spinal stenosis    Past Surgical History:  Procedure Laterality Date  . BREAST LUMPECTOMY Left 1996  . HARDWARE REMOVAL Left 09/03/2020   Procedure: HARDWARE REMOVAL;  Surgeon: Altamese [place], MD;  Location: [facility];  Service: Orthopedics;  Laterality: Left;  . I & D EXTREMITY  04/17/2012    Procedure: IRRIGATION AND DEBRIDEMENT EXTREMITY;  Surgeon: Rudean Haskell, MD;  Location: [facility];  Service: Orthopedics;  Laterality: Right;  . INTRAMEDULLARY (IM) NAIL INTERTROCHANTERIC Left 09/03/2020   Procedure: INTRAMEDULLARY (IM) NAIL INTERTROCHANTRIC;  Surgeon: Altamese Glorieta, MD;  Location: [facility];  Service: Orthopedics;  Laterality: Left;  . KNEE ARTHROSCOPY  04/17/2012   Procedure: ARTHROSCOPY KNEE;  Surgeon: Rudean Haskell, MD;  Location: [facility];  Service: Orthopedics;  Laterality: Right;  . KYPHOPLASTY  2009, 2013   thorasic, lumbar  . MENISECTOMY Right 04/11/2012  . ORIF FEMUR FRACTURE Left 11/01/2019   Procedure: OPEN REDUCTION INTERNAL FIXATION (ORIF) DISTAL FEMUR FRACTURE;  Surgeon: Altamese Trent, MD;  Location: [facility];  Service: Orthopedics;  Laterality: Left;  . ORIF PERIPROSTHETIC FRACTURE Right 05/03/2018   Procedure: RIGHT RETROGRADE FEMORAL NAIL;  Surgeon: Rod Can, MD;  Location: [facility];  Service: Orthopedics;  Laterality: Right;  . POSTERIOR LAMINECTOMY / DECOMPRESSION LUMBAR SPINE  1980's  . rotator cuff surgery  Right   . TONSILLECTOMY AND ADENOIDECTOMY     "I was a child"  . TOTAL KNEE ARTHROPLASTY Right 10/29/2012   Procedure: RIGHT TOTAL KNEE ARTHROPLASTY;  Surgeon: Gearlean Alf, MD;  Location: [facility];  Service: Orthopedics;  Laterality: Right;  . VEIN LIGATION      Social History   Socioeconomic History  . Marital status: Married    Spouse name: Not on file  . Number of children: 2  . Years of education: Not on file  . Highest education level: Not on file  Occupational History  . Occupation: retired  Tobacco Use  . Smoking status: Never Smoker  . Smokeless tobacco: Never Used  Vaping Use  . Vaping Use: Never used  Substance and Sexual Activity  . Alcohol use: No  . Drug use: No  . Sexual activity: Not on file  Other Topics Concern  . Not on file  Social History Narrative  . Not on file   Social Determinants of Health   Financial Resource  Strain: Not on file  Food Insecurity: Not on file  Transportation Needs: Not on file  Physical Activity: Not on file  Stress: Not on file  Social Connections: Not on file    reports that she has never smoked. She has never used smokeless tobacco. She reports that she does not drink alcohol and does not use drugs.  Functional Status Survey:    Family History  Problem Relation Age of Onset  . Kidney failure Mother   . Anuerysm Father        AAA    Health Maintenance  Topic Date Due  . DEXA SCAN  Never done  . COVID-19 Vaccine (4 - Booster) 04/10/2020  . TETANUS/TDAP  09/14/2025  . INFLUENZA VACCINE  Completed  . PNA vac Low Risk Adult  Completed    Allergies  Allergen Reactions  . Oysters [Shellfish Allergy] Nausea And Vomiting  .  Codeine Nausea And Vomiting  . Penicillins Rash    Outpatient Encounter Medications as of 09/15/2020  Medication Sig  . acetaminophen (TYLENOL) 325 MG tablet Take 2 tablets (650 mg total) by mouth every 6 (six) hours as needed for mild pain or moderate pain.  Marland Kitchen alendronate (FOSAMAX) 70 MG tablet Take 70 mg by mouth once a week. Take with a full glass of water on an empty stomach. On sundays  . apixaban (ELIQUIS) 5 MG TABS tablet Take 1 tablet (5 mg total) by mouth 2 (two) times daily.  . bisacodyl (DULCOLAX) 10 MG suppository Place 10 mg rectally as needed for moderate constipation.  . Calcium Carbonate-Vitamin D (CALCIUM-D) 600-400 MG-UNIT TABS Take 1 tablet by mouth daily.  Marland Kitchen escitalopram (LEXAPRO) 20 MG tablet Take 20 mg by mouth daily.  . furosemide (LASIX) 40 MG tablet Take 1 tablet (40 mg total) by mouth 3 (three) times a week.  . lactulose (CHRONULAC) 10 GM/15ML solution Take 30 mLs (20 g total) by mouth 2 (two) times daily as needed for mild constipation or moderate constipation.  Marland Kitchen losartan (COZAAR) 25 MG tablet Take 0.5 tablets (12.5 mg total) by mouth daily.  . magnesium oxide (MAG-OX) 400 (241.3 Mg) MG tablet Take 1 tablet (400 mg  total) by mouth 2 (two) times daily for 7 days.  . magnesium oxide (MAG-OX) 400 MG tablet Take 400 mg by mouth 2 (two) times daily.  . Multiple Vitamins-Minerals (MULTIVITAMIN ADULT) TABS Take 1 tablet by mouth daily.   . rosuvastatin (CRESTOR) 20 MG tablet Take 1 tablet (20 mg total) by mouth daily at 6 PM.  . senna-docusate (SENOKOT-S) 8.6-50 MG tablet Take 1 tablet by mouth at bedtime.  . Sodium Phosphates (RA SALINE ENEMA RE) Place rectally as needed.  . tamsulosin (FLOMAX) 0.4 MG CAPS capsule Take 1 capsule (0.4 mg total) by mouth daily.   No facility-administered encounter medications on file as of 09/15/2020.    Review of Systems  Constitutional: Negative for chills and fever.  HENT: Negative for congestion and sore throat.   Eyes: Negative for blurred vision.  Respiratory: Negative for cough and shortness of breath.   Cardiovascular: Positive for leg swelling. Negative for chest pain and palpitations.  Gastrointestinal: Positive for constipation. Negative for abdominal pain, blood in stool and melena.       Bowels finally moving past two days  Genitourinary: Negative for dysuria.       No suprapubic pain or fullness, voiding well  Musculoskeletal: Positive for falls and joint pain.  Skin: Negative for itching and rash.  Neurological: Negative for dizziness and loss of consciousness.  Endo/Heme/Allergies: Bruises/bleeds easily.  Psychiatric/Behavioral: Negative for depression. The patient is not nervous/anxious.     Vitals:   09/15/20 0927  BP: (!) 96/53  Pulse: 69  Resp: 18  Temp: 98.1 F (36.7 C)  Weight: 137 lb 4.8 oz (62.3 kg)  Height: 5\' 6"  (1.676 m)   Body mass index is 22.16 kg/m. Physical Exam Vitals reviewed.  Constitutional:      Appearance: Normal appearance.  HENT:     Head: Normocephalic and atraumatic.     Right Ear: External ear normal.     Left Ear: External ear normal.     Nose: Nose normal.     Mouth/Throat:     Pharynx: Oropharynx is clear.   Eyes:     Extraocular Movements: Extraocular movements intact.     Conjunctiva/sclera: Conjunctivae normal.     Pupils: Pupils are equal,  round, and reactive to light.  Cardiovascular:     Rate and Rhythm: Rhythm irregular.  Pulmonary:     Effort: Pulmonary effort is normal.     Breath sounds: Normal breath sounds. No wheezing, rhonchi or rales.  Abdominal:     General: Bowel sounds are normal. There is no distension.     Palpations: Abdomen is soft.     Tenderness: There is no abdominal tenderness. There is no guarding or rebound.  Musculoskeletal:        General: Normal range of motion.     Cervical back: Neck supple.     Right lower leg: No edema.     Left lower leg: No edema.  Skin:    General: Skin is warm.     Comments: Left hip incision w/o erythema, warmth or concerning drainage  Neurological:     General: No focal deficit present.     Mental Status: She is alert and oriented to person, place, and time.  Psychiatric:        Mood and Affect: Mood normal.        Behavior: Behavior normal.     Labs reviewed: Basic Metabolic Panel: Recent Labs    11/02/19 0029 11/03/19 0538 09/08/20 0317 09/09/20 0438 09/10/20 0459 09/11/20 0119  NA 138   < > 139 134* 138  --   K 3.8   < > 3.5 3.6 3.6  --   CL 102   < > 105 101 103  --   CO2 24   < > 25 23 26   --   GLUCOSE 162*   < > 98 83 104*  --   BUN 18   < > 15 14 19   --   CREATININE 0.75   < > 0.52 0.55 0.62  --   CALCIUM 8.7*   < > 8.9 8.8* 8.8*  --   MG 1.6*   < > 1.6* 1.5* 1.5* 1.9  PHOS 3.5  --   --   --   --   --    < > = values in this interval not displayed.   Liver Function Tests: Recent Labs    10/31/19 2235 11/02/19 0029 09/03/20 0405  AST 35 55* 29  ALT 28 37 30  ALKPHOS 64 58 50  BILITOT 0.8 1.4* 1.1  PROT 7.1 5.9* 5.2*  ALBUMIN 3.8 3.4* 2.9*   No results for input(s): LIPASE, AMYLASE in the last 8760 hours. No results for input(s): AMMONIA in the last 8760 hours. CBC: Recent Labs     11/07/19 1000 11/08/19 0819 09/02/20 2124 09/03/20 0405 09/08/20 0317 09/09/20 0438 09/10/20 0459  WBC 6.6 13.2* 10.3   < > 5.5 6.0 7.5  NEUTROABS 4.6 10.3* 8.7*  --   --   --   --   HGB 11.1* 10.5* 11.8*   < > 7.9* 8.2* 8.7*  HCT 33.5* 31.7* 37.1   < > 24.3* 24.9* 25.9*  MCV 94.1 95.5 95.4   < > 94.2 94.3 93.8  PLT 218 236 151   < > 174 187 238   < > = values in this interval not displayed.   Cardiac Enzymes: Recent Labs    10/31/19 2235 11/02/19 0029  CKTOTAL 272* 243*   BNP: Invalid input(s): POCBNP Lab Results  Component Value Date   HGBA1C 5.9 (H) 11/02/2019   Lab Results  Component Value Date   TSH 1.753 05/02/2018   Lab Results  Component Value Date  RXVQMGQQ76 589 09/06/2020   No results found for: FOLATE Lab Results  Component Value Date   IRON 14 (L) 09/06/2020   TIBC 220 (L) 09/06/2020    Imaging and Procedures obtained prior to SNF admission: DG Chest 1 View  Result Date: 09/02/2020 CLINICAL DATA:  Pain status post fall EXAM: CHEST  1 VIEW COMPARISON:  10/31/2019 FINDINGS: There are multiple old healed left-sided rib fractures. There is no pneumothorax. No large pleural effusion. The heart size is enlarged. There are prominent interstitial lung markings bilaterally which are essentially unchanged from prior study. There is a probable hiatal hernia. IMPRESSION: Stable chest x-ray with cardiomegaly and chronic lung changes. No acute osseous abnormality is identified on this exam. Electronically Signed   By: Constance Holster M.D.   On: 09/02/2020 21:17   DG Pelvis 1-2 Views  Result Date: 09/02/2020 CLINICAL DATA:  87 year old female with fall and trauma to the left hip. EXAM: PELVIS - 1-2 VIEW COMPARISON:  Pelvic radiograph dated 05/02/2018. FINDINGS: There is a comminuted and displaced fracture of the left femoral neck with involvement of the intertrochanteric region. There is mild proximal migration of the femoral shaft in relation to the head. Advanced  osteopenia. No dislocation. Partially visualized right femoral intramedullary nail. The soft tissues are unremarkable. IMPRESSION: Comminuted and displaced fracture of the left femoral neck. Electronically Signed   By: Anner Crete M.D.   On: 09/02/2020 21:14   DG Pelvis Portable  Result Date: 09/03/2020 CLINICAL DATA:  Intertrochanteric left femoral neck fracture. EXAM: PORTABLE PELVIS 1-2 VIEWS COMPARISON:  Earlier the same day and 09/02/2020. FINDINGS: Bones are diffusely demineralized. Status post ORIF for comminuted intertrochanteric left femoral neck fracture with reduction of varus angulation seen previously. Retrograde IM nail in the right femur has been incompletely visualized. Cranial tip of the pre-existing left femoral cortical plate again noted. IMPRESSION: Status post ORIF for comminuted intertrochanteric left femoral neck fracture. Improved alignment with no complication on the visualized anatomy. Electronically Signed   By: Misty Stanley M.D.   On: 09/03/2020 12:27   DG Knee Complete 4 Views Left  Result Date: 09/02/2020 CLINICAL DATA:  Pain EXAM: LEFT KNEE - COMPLETE 4+ VIEW COMPARISON:  X-ray earlier in the same day. FINDINGS: There are advanced tricompartmental degenerative changes of the left knee, greatest within the lateral compartment. There is a small joint effusion without definite evidence for an acute tibial plateau fracture. The visualized orthopedic hardware is intact. Vascular calcifications are noted. There is soft tissue swelling about the knee. IMPRESSION: Advanced tricompartmental degenerative changes of the left knee with a small joint effusion. No definite evidence for an acute osseous abnormality. Electronically Signed   By: Constance Holster M.D.   On: 09/02/2020 22:01   DG Knee Left Port  Result Date: 09/03/2020 CLINICAL DATA:  Pain EXAM: PORTABLE LEFT KNEE - 1-2 VIEW COMPARISON:  X-ray several hours prior FINDINGS: Examination is limited by patient positioning.  Again noted is orthopedic hardware involving the distal femur. There is no evidence for hardware fracture or failure. There are advanced tricompartmental degenerative changes of the knee without definite evidence for an acute displaced fracture or dislocation. There is surrounding soft tissue swelling with a probable small joint effusion. IMPRESSION: Exam limited by patient positioning. No significant interval change. If there is high clinical suspicion for an occult fracture follow-up with CT is recommended. Electronically Signed   By: Constance Holster M.D.   On: 09/03/2020 00:30   DG C-Arm 1-60 Min  Result Date:  09/03/2020 CLINICAL DATA:  ORIF of left femur. EXAM: DG C-ARM 1-60 MIN; LEFT FEMUR 2 VIEWS COMPARISON:  Left femur radiographs 09/02/2020 FLUOROSCOPY TIME:  Fluoroscopy Time:  1 minute 50 seconds Radiation Exposure Index (if provided by the fluoroscopic device): 24.88 mGy FINDINGS: Nine intraoperative spot fluoroscopic images of the left femur are provided. These demonstrate placement of an antegrade intramedullary nail and proximal and distal interlocking screws for fixation of the previously described intertrochanteric fracture. Remote plate and screw fixation of the distal left femur is again noted. A right knee arthroplasty and right femoral intramedullary nail are also partially visualized. IMPRESSION: Intraoperative images during ORIF of left intertrochanteric femur fracture. Electronically Signed   By: Logan Bores M.D.   On: 09/03/2020 11:14   DG FEMUR MIN 2 VIEWS LEFT  Result Date: 09/03/2020 CLINICAL DATA:  ORIF of left femur. EXAM: DG C-ARM 1-60 MIN; LEFT FEMUR 2 VIEWS COMPARISON:  Left femur radiographs 09/02/2020 FLUOROSCOPY TIME:  Fluoroscopy Time:  1 minute 50 seconds Radiation Exposure Index (if provided by the fluoroscopic device): 24.88 mGy FINDINGS: Nine intraoperative spot fluoroscopic images of the left femur are provided. These demonstrate placement of an antegrade  intramedullary nail and proximal and distal interlocking screws for fixation of the previously described intertrochanteric fracture. Remote plate and screw fixation of the distal left femur is again noted. A right knee arthroplasty and right femoral intramedullary nail are also partially visualized. IMPRESSION: Intraoperative images during ORIF of left intertrochanteric femur fracture. Electronically Signed   By: Logan Bores M.D.   On: 09/03/2020 11:14   DG Femur Min 2 Views Left  Result Date: 09/02/2020 CLINICAL DATA:  Pain EXAM: LEFT FEMUR 2 VIEWS COMPARISON:  None. FINDINGS: There is an acute, comminuted and displaced intratrochanteric fracture of the proximal left femur. The patient has undergone prior plate screw fixation of the mid to distal femur. There is an old healed fracture of the distal left femur. The hardware is intact. There are end-stage degenerative changes of the left knee. There is an apparent age-indeterminate lateral tibial plateau fracture. There is soft tissue swelling about the knee. There is a small joint effusion. IMPRESSION: 1. Acute comminuted and displaced intratrochanteric fracture of the proximal left femur. 2. Age-indeterminate lateral tibial plateau fracture. Dedicated knee radiographs are recommended. 3. Old healed fracture of the distal left femur after plate screw fixation. 4. End-stage degenerative changes of the left knee. Electronically Signed   By: Constance Holster M.D.   On: 09/02/2020 21:15   DG FEMUR PORT MIN 2 VIEWS LEFT  Result Date: 09/03/2020 CLINICAL DATA:  ORIF for comminuted intertrochanteric femoral neck fracture. EXAM: LEFT FEMUR PORTABLE 2 VIEWS COMPARISON:  Earlier same day and 09/02/2020 FINDINGS: Status post long antegrade IM nail placement with dynamic screw transfixing the femoral neck fracture. Interval reduction of varus angulation seen preoperatively. No hardware complication. Pre-existing lateral plate and screw fixator again noted. IMPRESSION:  Status post ORIF for femoral neck fracture. No evidence for immediate hardware complication. Electronically Signed   By: Misty Stanley M.D.   On: 09/03/2020 12:29    Assessment/Plan 1. Displaced intertrochanteric fracture of left femur, initial encounter for closed fracture ([facility]) -s/p IM nail and removal of left femoral implant by Dr. Marcelino Scot 2/10 -f/u with him in 10 days as ordered -cont tylenol and eliquis prophylaxis  2. Chronic systolic CHF (congestive heart failure) (HCC) -appears euvolemic now, appetite not that good -cont lasix three times weekly and monitor weights and edema  3. History of pulmonary  embolism -cont eliquis long-term  4. History of DVT (deep vein thrombosis) -cont eliquis long-term  5. Major depression in remission [facility]) -doing well with lexapro, but seems spirits are down a bit more being here instead of her home   6. Coronary artery disease involving native coronary artery of native heart without angina pectoris -cont bp, lipid control   7. Drug-induced constipation -cont senokot-s, magnesium, lactulose bid prn -bowels are now moving and she's not using the tramadol anymore  8. Acute urinary retention -resolved and foley d/c'd -staying on flomax until more mobile  9. Postoperative anemia due to acute blood loss -s/p iron infusion, f/u cbc at one week  Family/ staff Communication: d/w snf nurse  Labs/tests ordered:  Cbc, bmp at one week, f/u with Dr. Faylene Million L. Verginia Toohey, D.O. [facility] [address], [place] [address] Cell Phone (Mon-Fri 8am-5pm):  [phone] On Call:  [phone] & follow prompts after 5pm & weekends Office Phone:  [phone] Office Fax:  [phone]

## 2020-09-23 DIAGNOSIS — T8484XD Pain due to internal orthopedic prosthetic devices, implants and grafts, subsequent encounter: Secondary | ICD-10-CM | POA: Diagnosis not present

## 2020-09-23 DIAGNOSIS — S72142D Displaced intertrochanteric fracture of left femur, subsequent encounter for closed fracture with routine healing: Secondary | ICD-10-CM | POA: Diagnosis not present

## 2020-09-30 LAB — BASIC METABOLIC PANEL
CO2: 28 — AB (ref 13–22)
Chloride: 100 (ref 99–108)
Creatinine: 0.5 (ref 0.5–1.1)
Glucose: 192
Potassium: 3.4 (ref 3.4–5.3)
Sodium: 138 (ref 137–147)

## 2020-09-30 LAB — CBC AND DIFFERENTIAL
HCT: 30 — AB (ref 36–46)
Hemoglobin: 10.1 — AB (ref 12.0–16.0)
Platelets: 186 (ref 150–399)
WBC: 6

## 2020-09-30 LAB — CBC: RBC: 3.15 — AB (ref 3.87–5.11)

## 2020-09-30 LAB — COMPREHENSIVE METABOLIC PANEL
Calcium: 9.1 (ref 8.7–10.7)
GFR calc Af Amer: >90
GFR calc non Af Amer: 85.49

## 2020-10-09 DIAGNOSIS — R2681 Unsteadiness on feet: Secondary | ICD-10-CM | POA: Diagnosis not present

## 2020-10-09 DIAGNOSIS — R2689 Other abnormalities of gait and mobility: Secondary | ICD-10-CM | POA: Diagnosis not present

## 2020-10-09 DIAGNOSIS — M6281 Muscle weakness (generalized): Secondary | ICD-10-CM | POA: Diagnosis not present

## 2020-10-09 DIAGNOSIS — S72142D Displaced intertrochanteric fracture of left femur, subsequent encounter for closed fracture with routine healing: Secondary | ICD-10-CM | POA: Diagnosis not present

## 2020-10-10 DIAGNOSIS — M6281 Muscle weakness (generalized): Secondary | ICD-10-CM | POA: Diagnosis not present

## 2020-10-10 DIAGNOSIS — R2681 Unsteadiness on feet: Secondary | ICD-10-CM | POA: Diagnosis not present

## 2020-10-10 DIAGNOSIS — R2689 Other abnormalities of gait and mobility: Secondary | ICD-10-CM | POA: Diagnosis not present

## 2020-10-10 DIAGNOSIS — S72142D Displaced intertrochanteric fracture of left femur, subsequent encounter for closed fracture with routine healing: Secondary | ICD-10-CM | POA: Diagnosis not present

## 2020-10-12 ENCOUNTER — Non-Acute Institutional Stay (SKILLED_NURSING_FACILITY): Payer: Medicare Other | Admitting: Orthopedic Surgery

## 2020-10-12 ENCOUNTER — Encounter: Payer: Self-pay | Admitting: Orthopedic Surgery

## 2020-10-12 DIAGNOSIS — Z86718 Personal history of other venous thrombosis and embolism: Secondary | ICD-10-CM | POA: Diagnosis not present

## 2020-10-12 DIAGNOSIS — I5022 Chronic systolic (congestive) heart failure: Secondary | ICD-10-CM | POA: Diagnosis not present

## 2020-10-12 DIAGNOSIS — R2681 Unsteadiness on feet: Secondary | ICD-10-CM | POA: Diagnosis not present

## 2020-10-12 DIAGNOSIS — S72142D Displaced intertrochanteric fracture of left femur, subsequent encounter for closed fracture with routine healing: Secondary | ICD-10-CM

## 2020-10-12 DIAGNOSIS — R2689 Other abnormalities of gait and mobility: Secondary | ICD-10-CM | POA: Diagnosis not present

## 2020-10-12 DIAGNOSIS — K5903 Drug induced constipation: Secondary | ICD-10-CM

## 2020-10-12 DIAGNOSIS — D62 Acute posthemorrhagic anemia: Secondary | ICD-10-CM

## 2020-10-12 DIAGNOSIS — Z86711 Personal history of pulmonary embolism: Secondary | ICD-10-CM | POA: Diagnosis not present

## 2020-10-12 DIAGNOSIS — M6281 Muscle weakness (generalized): Secondary | ICD-10-CM | POA: Diagnosis not present

## 2020-10-12 DIAGNOSIS — I2510 Atherosclerotic heart disease of native coronary artery without angina pectoris: Secondary | ICD-10-CM

## 2020-10-12 DIAGNOSIS — F325 Major depressive disorder, single episode, in full remission: Secondary | ICD-10-CM

## 2020-10-12 DIAGNOSIS — R338 Other retention of urine: Secondary | ICD-10-CM

## 2020-10-12 MED ORDER — FUROSEMIDE 40 MG PO TABS
40.0000 mg | ORAL_TABLET | ORAL | 0 refills | Status: DC
Start: 2020-10-12 — End: 2020-11-25

## 2020-10-12 MED ORDER — LOSARTAN POTASSIUM 25 MG PO TABS
12.5000 mg | ORAL_TABLET | Freq: Every day | ORAL | 0 refills | Status: DC
Start: 2020-10-12 — End: 2022-04-13

## 2020-10-12 MED ORDER — SENNOSIDES-DOCUSATE SODIUM 8.6-50 MG PO TABS: 1.0000 | ORAL_TABLET | Freq: Every day | ORAL | 0 refills | Status: DC

## 2020-10-12 MED ORDER — ALENDRONATE SODIUM 70 MG PO TABS: 70.0000 mg | ORAL_TABLET | ORAL | 0 refills | Status: DC

## 2020-10-12 MED ORDER — TAMSULOSIN HCL 0.4 MG PO CAPS
0.4000 mg | ORAL_CAPSULE | Freq: Every day | ORAL | 0 refills | Status: DC
Start: 2020-10-12 — End: 2022-04-13

## 2020-10-12 MED ORDER — OMEPRAZOLE 40 MG PO CPDR: 40.0000 mg | DELAYED_RELEASE_CAPSULE | Freq: Every day | ORAL | 0 refills | Status: DC

## 2020-10-12 MED ORDER — ROSUVASTATIN CALCIUM 20 MG PO TABS: 20.0000 mg | ORAL_TABLET | Freq: Every day | ORAL | 0 refills | Status: DC

## 2020-10-12 MED ORDER — APIXABAN 5 MG PO TABS: 5.0000 mg | ORAL_TABLET | Freq: Two times a day (BID) | ORAL | 0 refills | Status: DC

## 2020-10-12 MED ORDER — LACTULOSE 10 GM/15ML PO SOLN: 20.0000 g | Freq: Two times a day (BID) | ORAL | 0 refills | Status: DC | PRN

## 2020-10-12 MED ORDER — ESCITALOPRAM OXALATE 20 MG PO TABS
20.0000 mg | ORAL_TABLET | Freq: Every day | ORAL | 0 refills | Status: DC
Start: 2020-10-12 — End: 2022-04-13

## 2020-10-12 NOTE — Progress Notes (Signed)
Location:    [facility] Room Number: 130/Q Place of Service:  SNF [phone])  Provider: Windell Moulding NP  PCP: Burnard Bunting, MD Patient Care Team: Burnard Bunting, MD as PCP - General (Internal Medicine) Croitoru, Dani Gobble, MD as PCP - Cardiology (Cardiology) Altamese [place], MD as Consulting Physician (Orthopedic Surgery)  Extended Emergency Contact Information Primary Emergency Contact: Alfredo Batty Address: [address]          [place], [place] [address] Johnnette Litter of [place] Phone: [phone] Work Phone: [phone] Relation: Spouse Secondary Emergency Contact: Mosetta Pigeon [place] of [place] Phone: [phone] Mobile Phone: [phone] Relation: Daughter  Code Status: Full Code Goals of care:  Advanced Directive information Advanced Directives 10/12/2020  Does Patient Have a Medical Advance Directive? No  Type of Advance Directive -  Does patient want to make changes to medical advance directive? -  Copy of [place] in Chart? -  Would patient like information on creating a medical advance directive? No - Patient declined  Pre-existing out of facility DNR order (yellow form or pink MOST form) -     Allergies  Allergen Reactions  . Oysters [Shellfish Allergy] Nausea And Vomiting  . Codeine Nausea And Vomiting  . Penicillins Rash    Chief Complaint  Patient presents with  . Discharge Note    Discharge Visit      HPI:  87 y.o. female seen today for discharge evaluation.   She currently resides at [facility], seen today at bedside. PMH includes: congestive heart failure, hypertension, NSTEMI, paroxysmal atrial tachycardia, previous DVT, multiiple fractures, depression, fatigue, and weakness.   02/09 she presented to [facility] with left leg pain. Left hip x-rays revealed acute comminuted and displaced intertrochanteric fracture of left proximal femur. 02/10 she  had IM nail of left hip and removal of left femoral implant by Dr. Altamese [place].   She was admitted to [facility] for additional skilled nursing services and PT/OT. At this time, her pain is tolerable with prn tylenol. Ambulating about 120 ft with FWW. Touching assistance with some ADLs, moderate assist with bed mobility and transfers. Last bowel movement a few days ago. Her appetite has been poor, eating 1-2 meals daily. She admits to not feeling thirsty and is not drinking enough water. She denies chest pain, sob or calf pain at this time.   Swallow study done 02/24- advised not to use straws.   No recent falls or injuries.   Facility nurse does not report any concerns, vitals stable.   At this time, she plans to discharge home 10/13/2020. Family will help with transportation. Husband will assist with home care. Home health PT/OT ordered. I have advised her to f/u with PCP in 1-2 weeks. I have also advised her to continue scheduled f/u with Dr. Marcelino Scot.       Past Medical History:  Diagnosis Date  . Arthritis    "in my back"  . Breast cancer ([place]) 1996   left breast cancer   . Complication of anesthesia 04/18/2012   "didn't tolerate it today very well; had the shakes and very hard time w/it"  . Depression 11/20/2012  . DVT (deep venous thrombosis) ([place])   . High cholesterol   . History of alcoholism ([facility])    7 1/2 years clean  . Hypertension   . Melanoma ([place]) 01/15/2004   upper right arm  . Osteoporosis   .  Peripheral vascular disease (HCC)    hx of ligation   . Personal history of radiation therapy 1993   Left Breast Cancer  . SCC (squamous cell carcinoma) 01/15/2004   left post upper arm, Right elbow, post left knee  . SCC (squamous cell carcinoma) 05/21/2013   right forearm, right jawline, left cheek  . SCC (squamous cell carcinoma) 08/24/2016   right shin,right thigh  . SCC (squamous cell carcinoma) 11/01/2016  . SCC (squamous cell carcinoma)  02/02/2018   left outer arm,right outer thigh  . SCC (squamous cell carcinoma) 12/13/2018   right jawline  . Spinal stenosis     Past Surgical History:  Procedure Laterality Date  . BREAST LUMPECTOMY Left 1996  . HARDWARE REMOVAL Left 09/03/2020   Procedure: HARDWARE REMOVAL;  Surgeon: Altamese [place], MD;  Location: [facility];  Service: Orthopedics;  Laterality: Left;  . I & D EXTREMITY  04/17/2012   Procedure: IRRIGATION AND DEBRIDEMENT EXTREMITY;  Surgeon: Rudean Haskell, MD;  Location: [facility];  Service: Orthopedics;  Laterality: Right;  . INTRAMEDULLARY (IM) NAIL INTERTROCHANTERIC Left 09/03/2020   Procedure: INTRAMEDULLARY (IM) NAIL INTERTROCHANTRIC;  Surgeon: Altamese [place]i, MD;  Location: [facility];  Service: Orthopedics;  Laterality: Left;  . KNEE ARTHROSCOPY  04/17/2012   Procedure: ARTHROSCOPY KNEE;  Surgeon: Rudean Haskell, MD;  Location: [facility];  Service: Orthopedics;  Laterality: Right;  . KYPHOPLASTY  2009, 2013   thorasic, lumbar  . MENISECTOMY Right 04/11/2012  . ORIF FEMUR FRACTURE Left 11/01/2019   Procedure: OPEN REDUCTION INTERNAL FIXATION (ORIF) DISTAL FEMUR FRACTURE;  Surgeon: Altamese Mackey, MD;  Location: [facility];  Service: Orthopedics;  Laterality: Left;  . ORIF PERIPROSTHETIC FRACTURE Right 05/03/2018   Procedure: RIGHT RETROGRADE FEMORAL NAIL;  Surgeon: Rod Can, MD;  Location: [facility];  Service: Orthopedics;  Laterality: Right;  . POSTERIOR LAMINECTOMY / DECOMPRESSION LUMBAR SPINE  1980's  . rotator cuff surgery  Right   . TONSILLECTOMY AND ADENOIDECTOMY     "I was a child"  . TOTAL KNEE ARTHROPLASTY Right 10/29/2012   Procedure: RIGHT TOTAL KNEE ARTHROPLASTY;  Surgeon: Gearlean Alf, MD;  Location: [facility];  Service: Orthopedics;  Laterality: Right;  . VEIN LIGATION        reports that she has never smoked. She has never used smokeless tobacco. She reports that she does not drink alcohol and does not use drugs. Social History   Socioeconomic History  . Marital  status: Married    Spouse name: Not on file  . Number of children: 2  . Years of education: Not on file  . Highest education level: Not on file  Occupational History  . Occupation: retired  Tobacco Use  . Smoking status: Never Smoker  . Smokeless tobacco: Never Used  Vaping Use  . Vaping Use: Never used  Substance and Sexual Activity  . Alcohol use: No  . Drug use: No  . Sexual activity: Not on file  Other Topics Concern  . Not on file  Social History Narrative  . Not on file   Social Determinants of Health   Financial Resource Strain: Not on file  Food Insecurity: Not on file  Transportation Needs: Not on file  Physical Activity: Not on file  Stress: Not on file  Social Connections: Not on file  Intimate Partner Violence: Not on file   Functional Status Survey:    Allergies  Allergen Reactions  . Oysters [Shellfish Allergy] Nausea And Vomiting  . Codeine Nausea And Vomiting  .  Penicillins Rash    Pertinent  Health Maintenance Due  Topic Date Due  . DEXA SCAN  Never done  . INFLUENZA VACCINE  Completed  . PNA vac Low Risk Adult  Completed    Medications: Allergies as of 10/12/2020      Reactions   Oysters [shellfish Allergy] Nausea And Vomiting   Codeine Nausea And Vomiting   Penicillins Rash      Medication List       Accurate as of October 12, 2020 11:53 AM. If you have any questions, ask your nurse or doctor.        acetaminophen 325 MG tablet Commonly known as: TYLENOL Take 2 tablets (650 mg total) by mouth every 6 (six) hours as needed for mild pain or moderate pain.   alendronate 70 MG tablet Commonly known as: FOSAMAX Take 70 mg by mouth once a week. Take with a full glass of water on an empty stomach. On sundays   apixaban 5 MG Tabs tablet Commonly known as: ELIQUIS Take 1 tablet (5 mg total) by mouth 2 (two) times daily.   bisacodyl 10 MG suppository Commonly known as: DULCOLAX Place 10 mg rectally as needed for moderate  constipation.   Calcium-D 600-400 MG-UNIT Tabs Take 1 tablet by mouth daily.   Ensure Take 237 mLs by mouth daily in the afternoon.   escitalopram 20 MG tablet Commonly known as: LEXAPRO Take 20 mg by mouth daily.   furosemide 40 MG tablet Commonly known as: LASIX Take 40 mg by mouth 3 (three) times a week. What changed: Another medication with the same name was removed. Continue taking this medication, and follow the directions you see here. Changed by: Yvonna Alanis, NP   lactulose 10 GM/15ML solution Commonly known as: CHRONULAC Take 30 mLs (20 g total) by mouth 2 (two) times daily as needed for mild constipation or moderate constipation.   losartan 25 MG tablet Commonly known as: COZAAR Take 0.5 tablets (12.5 mg total) by mouth daily.   Multivitamin Adult Tabs Take 1 tablet by mouth daily.   omeprazole 40 MG capsule Commonly known as: PRILOSEC Take 40 mg by mouth daily.   RA SALINE ENEMA RE Place rectally as needed.   rosuvastatin 20 MG tablet Commonly known as: CRESTOR Take 1 tablet (20 mg total) by mouth daily at 6 PM.   senna-docusate 8.6-50 MG tablet Commonly known as: Senokot-S Take 1 tablet by mouth at bedtime.   tamsulosin 0.4 MG Caps capsule Commonly known as: FLOMAX Take 1 capsule (0.4 mg total) by mouth daily.       Review of Systems  Constitutional: Negative for activity change, appetite change, fatigue and fever.  HENT: Positive for trouble swallowing. Negative for dental problem and hearing loss.   Eyes: Negative for visual disturbance.  Respiratory: Negative for cough, shortness of breath and wheezing.   Cardiovascular: Negative for chest pain and leg swelling.  Gastrointestinal: Positive for constipation. Negative for abdominal distention, abdominal pain, diarrhea and nausea.  Genitourinary: Negative for dysuria, frequency and hematuria.  Musculoskeletal: Positive for arthralgias and myalgias.       Left leg pain  Skin:       Surgical  incision  Neurological: Positive for weakness. Negative for dizziness and headaches.  Hematological: Bruises/bleeds easily.  Psychiatric/Behavioral: Negative for confusion and dysphoric mood. The patient is not nervous/anxious.     Vitals:   10/12/20 1129  BP: 124/72  Pulse: 70  Resp: 18  Temp: 98 F (36.7 C)  SpO2: 96%  Weight: 120 lb 12.8 oz (54.8 kg)  Height: 5\' 6"  (1.676 m)   Body mass index is 19.5 kg/m. Physical Exam Vitals reviewed.  Constitutional:      General: She is not in acute distress. HENT:     Head: Normocephalic.     Right Ear: There is no impacted cerumen.     Left Ear: There is no impacted cerumen.     Nose: Nose normal.     Mouth/Throat:     Mouth: Mucous membranes are moist.  Eyes:     General:        Right eye: No discharge.        Left eye: No discharge.  Cardiovascular:     Rate and Rhythm: Normal rate and regular rhythm.     Pulses: Normal pulses.     Heart sounds: Normal heart sounds. No murmur heard.   Pulmonary:     Effort: Pulmonary effort is normal. No respiratory distress.     Breath sounds: Normal breath sounds. No wheezing.  Abdominal:     General: Bowel sounds are normal. There is distension.     Palpations: Abdomen is soft.     Tenderness: There is no abdominal tenderness.  Musculoskeletal:     Cervical back: Normal range of motion.     Right lower leg: No edema.     Left lower leg: No edema.  Lymphadenopathy:     Cervical: No cervical adenopathy.  Skin:    General: Skin is warm and dry.     Capillary Refill: Capillary refill takes less than 2 seconds.     Comments: Left hip incisions x 3 closed, no drainage. Surrounding skin intact.   Neurological:     Mental Status: She is alert. Mental status is at baseline.     Motor: Weakness present.     Gait: Gait abnormal.     Comments: Wheelchair/walker  Psychiatric:        Mood and Affect: Mood normal.        Behavior: Behavior normal.     Labs reviewed: Basic Metabolic  Panel: Recent Labs    11/02/19 0029 11/03/19 0538 09/08/20 0317 09/09/20 0438 09/10/20 0459 09/11/20 0119 09/15/20 0000 09/30/20 0000  NA 138   < > 139 134* 138  --  134* 138  K 3.8   < > 3.5 3.6 3.6  --  4.2 3.4  CL 102   < > 105 101 103  --  95* 100  CO2 24   < > 25 23 26   --  29* 28*  GLUCOSE 162*   < > 98 83 104*  --   --   --   BUN 18   < > 15 14 19   --  31*  --   CREATININE 0.75   < > 0.52 0.55 0.62  --  0.7 0.5  CALCIUM 8.7*   < > 8.9 8.8* 8.8*  --  9.6 9.1  MG 1.6*   < > 1.6* 1.5* 1.5* 1.9  --   --   PHOS 3.5  --   --   --   --   --   --   --    < > = values in this interval not displayed.   Liver Function Tests: Recent Labs    10/31/19 2235 11/02/19 0029 09/03/20 0405  AST 35 55* 29  ALT 28 37 30  ALKPHOS 64 58 50  BILITOT 0.8 1.4* 1.1  PROT  7.1 5.9* 5.2*  ALBUMIN 3.8 3.4* 2.9*   No results for input(s): LIPASE, AMYLASE in the last 8760 hours. No results for input(s): AMMONIA in the last 8760 hours. CBC: Recent Labs    11/07/19 1000 11/08/19 0819 09/02/20 2124 09/03/20 0405 09/08/20 0317 09/09/20 0438 09/10/20 0459 09/15/20 0000 09/30/20 0000  WBC 6.6 13.2* 10.3   < > 5.5 6.0 7.5 8.4 6.0  NEUTROABS 4.6 10.3* 8.7*  --   --   --   --   --   --   HGB 11.1* 10.5* 11.8*   < > 7.9* 8.2* 8.7* 9.0* 10.1*  HCT 33.5* 31.7* 37.1   < > 24.3* 24.9* 25.9* 26* 30*  MCV 94.1 95.5 95.4   < > 94.2 94.3 93.8  --   --   PLT 218 236 151   < > 174 187 238 386 186   < > = values in this interval not displayed.   Cardiac Enzymes: Recent Labs    10/31/19 2235 11/02/19 0029  CKTOTAL 272* 243*   BNP: Invalid input(s): POCBNP CBG: Recent Labs    09/03/20 0742  GLUCAP 150*    Procedures and Imaging Studies During Stay: No results found.  Assessment/Plan:   1. Closed displaced intertrochanteric fracture of left femur with routine healing, subsequent encounter - stable, incisions healed, no sign of infection, pain controlled - followed by Dr. Marcelino Scot - cont  prn tylenol for pain - cont eliquis for dvt prophylaxis - home health PT/OT  2. Chronic systolic CHF (congestive heart failure) (HCC) - weight loss during stay, suspect poor appetite - cont lasix three times weekly - cont daily weights  3. History of pulmonary embolism - remains on long-tern Eliquis for history of dvt  4. History of DVT (deep vein thrombosis) - same as above  5. Major depression in remission ([place]) - stable with Lexapro  6. Coronary artery disease involving native coronary artery of native heart without angina pectoris - bp < 150/90 - cont lipid control with statin  7. Drug-induced constipation - ongoing, abdomen distended, active bowel sounds, no nausea - cont senna, magnesium and lactulose prn - encourage hydration  8. Acute urinary retention - stable with Flomax  9. Postoperative anemia due to acute blood loss - 03/09- hemoglobin 10.1- trending up from surgery, was 8.7 - cbc/diff in 1-2 weeks   Patient is being discharged with the following home health services:PT/OT    Patient is being discharged with the following durable medical equipment:None    Patient has been advised to f/u with their PCP in 1-2 weeks to bring them up to date on their rehab stay.  Social services at facility was responsible for arranging this appointment.  Pt was provided with a 30 day supply of prescriptions for medications and refills must be obtained from their PCP.  For controlled substances, a more limited supply may be provided adequate until PCP appointment only.  Future labs/tests needed:  Cbc/diff, bmp in 1-2 weeks

## 2020-10-13 DIAGNOSIS — R2689 Other abnormalities of gait and mobility: Secondary | ICD-10-CM | POA: Diagnosis not present

## 2020-10-13 DIAGNOSIS — R2681 Unsteadiness on feet: Secondary | ICD-10-CM | POA: Diagnosis not present

## 2020-10-13 DIAGNOSIS — S72142D Displaced intertrochanteric fracture of left femur, subsequent encounter for closed fracture with routine healing: Secondary | ICD-10-CM | POA: Diagnosis not present

## 2020-10-13 DIAGNOSIS — M6281 Muscle weakness (generalized): Secondary | ICD-10-CM | POA: Diagnosis not present

## 2020-10-14 DIAGNOSIS — R2689 Other abnormalities of gait and mobility: Secondary | ICD-10-CM | POA: Diagnosis not present

## 2020-10-14 DIAGNOSIS — R2681 Unsteadiness on feet: Secondary | ICD-10-CM | POA: Diagnosis not present

## 2020-10-14 DIAGNOSIS — M6281 Muscle weakness (generalized): Secondary | ICD-10-CM | POA: Diagnosis not present

## 2020-10-14 DIAGNOSIS — S72142D Displaced intertrochanteric fracture of left femur, subsequent encounter for closed fracture with routine healing: Secondary | ICD-10-CM | POA: Diagnosis not present

## 2020-10-16 DIAGNOSIS — M85852 Other specified disorders of bone density and structure, left thigh: Secondary | ICD-10-CM | POA: Diagnosis not present

## 2020-10-16 DIAGNOSIS — I2510 Atherosclerotic heart disease of native coronary artery without angina pectoris: Secondary | ICD-10-CM | POA: Diagnosis not present

## 2020-10-16 DIAGNOSIS — I5022 Chronic systolic (congestive) heart failure: Secondary | ICD-10-CM | POA: Diagnosis not present

## 2020-10-16 DIAGNOSIS — E785 Hyperlipidemia, unspecified: Secondary | ICD-10-CM | POA: Diagnosis not present

## 2020-10-16 DIAGNOSIS — E7800 Pure hypercholesterolemia, unspecified: Secondary | ICD-10-CM | POA: Diagnosis not present

## 2020-10-16 DIAGNOSIS — M25462 Effusion, left knee: Secondary | ICD-10-CM | POA: Diagnosis not present

## 2020-10-16 DIAGNOSIS — E1151 Type 2 diabetes mellitus with diabetic peripheral angiopathy without gangrene: Secondary | ICD-10-CM | POA: Diagnosis not present

## 2020-10-16 DIAGNOSIS — Z79891 Long term (current) use of opiate analgesic: Secondary | ICD-10-CM | POA: Diagnosis not present

## 2020-10-16 DIAGNOSIS — M479 Spondylosis, unspecified: Secondary | ICD-10-CM | POA: Diagnosis not present

## 2020-10-16 DIAGNOSIS — M4800 Spinal stenosis, site unspecified: Secondary | ICD-10-CM | POA: Diagnosis not present

## 2020-10-16 DIAGNOSIS — M80052D Age-related osteoporosis with current pathological fracture, left femur, subsequent encounter for fracture with routine healing: Secondary | ICD-10-CM | POA: Diagnosis not present

## 2020-10-16 DIAGNOSIS — D62 Acute posthemorrhagic anemia: Secondary | ICD-10-CM | POA: Diagnosis not present

## 2020-10-16 DIAGNOSIS — Z7901 Long term (current) use of anticoagulants: Secondary | ICD-10-CM | POA: Diagnosis not present

## 2020-10-16 DIAGNOSIS — K5903 Drug induced constipation: Secondary | ICD-10-CM | POA: Diagnosis not present

## 2020-10-16 DIAGNOSIS — I110 Hypertensive heart disease with heart failure: Secondary | ICD-10-CM | POA: Diagnosis not present

## 2020-10-16 DIAGNOSIS — F325 Major depressive disorder, single episode, in full remission: Secondary | ICD-10-CM | POA: Diagnosis not present

## 2020-10-21 DIAGNOSIS — T8484XD Pain due to internal orthopedic prosthetic devices, implants and grafts, subsequent encounter: Secondary | ICD-10-CM | POA: Diagnosis not present

## 2020-10-21 DIAGNOSIS — E1169 Type 2 diabetes mellitus with other specified complication: Secondary | ICD-10-CM | POA: Diagnosis not present

## 2020-10-21 DIAGNOSIS — R634 Abnormal weight loss: Secondary | ICD-10-CM | POA: Diagnosis not present

## 2020-10-21 DIAGNOSIS — I130 Hypertensive heart and chronic kidney disease with heart failure and stage 1 through stage 4 chronic kidney disease, or unspecified chronic kidney disease: Secondary | ICD-10-CM | POA: Diagnosis not present

## 2020-10-21 DIAGNOSIS — S72142D Displaced intertrochanteric fracture of left femur, subsequent encounter for closed fracture with routine healing: Secondary | ICD-10-CM | POA: Diagnosis not present

## 2020-10-21 DIAGNOSIS — N182 Chronic kidney disease, stage 2 (mild): Secondary | ICD-10-CM | POA: Diagnosis not present

## 2020-10-21 DIAGNOSIS — I10 Essential (primary) hypertension: Secondary | ICD-10-CM | POA: Diagnosis not present

## 2020-10-21 DIAGNOSIS — I5032 Chronic diastolic (congestive) heart failure: Secondary | ICD-10-CM | POA: Diagnosis not present

## 2020-10-21 DIAGNOSIS — R413 Other amnesia: Secondary | ICD-10-CM | POA: Diagnosis not present

## 2020-10-22 ENCOUNTER — Other Ambulatory Visit: Payer: Self-pay | Admitting: Orthopedic Surgery

## 2020-10-22 DIAGNOSIS — R29898 Other symptoms and signs involving the musculoskeletal system: Secondary | ICD-10-CM

## 2020-10-28 ENCOUNTER — Other Ambulatory Visit: Payer: Self-pay | Admitting: Orthopedic Surgery

## 2020-10-30 DIAGNOSIS — M1712 Unilateral primary osteoarthritis, left knee: Secondary | ICD-10-CM | POA: Diagnosis not present

## 2020-10-30 DIAGNOSIS — Z723 Lack of physical exercise: Secondary | ICD-10-CM | POA: Diagnosis not present

## 2020-11-05 ENCOUNTER — Other Ambulatory Visit: Payer: Self-pay | Admitting: Orthopedic Surgery

## 2020-11-05 ENCOUNTER — Other Ambulatory Visit: Payer: Self-pay

## 2020-11-05 ENCOUNTER — Ambulatory Visit
Admission: RE | Admit: 2020-11-05 | Discharge: 2020-11-05 | Disposition: A | Discharge status: Home / Self Care | Payer: Medicare Other | Source: Ambulatory Visit | Attending: Orthopedic Surgery | Admitting: Orthopedic Surgery

## 2020-11-05 DIAGNOSIS — R29898 Other symptoms and signs involving the musculoskeletal system: Secondary | ICD-10-CM

## 2020-11-05 DIAGNOSIS — M47817 Spondylosis without myelopathy or radiculopathy, lumbosacral region: Secondary | ICD-10-CM | POA: Diagnosis not present

## 2020-11-05 MED ORDER — METHYLPREDNISOLONE ACETATE 40 MG/ML INJ SUSP (RADIOLOG
90.0000 mg | Freq: Once | INTRAMUSCULAR | Status: AC
  Administered 2020-11-05: 80 mg via EPIDURAL

## 2020-11-05 MED ORDER — IOPAMIDOL (ISOVUE-M 200) INJECTION 41%
1.0000 mL | Freq: Once | INTRAMUSCULAR | Status: AC
  Administered 2020-11-05: 1 mL via EPIDURAL

## 2020-11-05 NOTE — Discharge Instructions (Signed)

## 2020-11-15 DIAGNOSIS — E1151 Type 2 diabetes mellitus with diabetic peripheral angiopathy without gangrene: Secondary | ICD-10-CM | POA: Diagnosis not present

## 2020-11-15 DIAGNOSIS — I5022 Chronic systolic (congestive) heart failure: Secondary | ICD-10-CM | POA: Diagnosis not present

## 2020-11-15 DIAGNOSIS — K5903 Drug induced constipation: Secondary | ICD-10-CM | POA: Diagnosis not present

## 2020-11-15 DIAGNOSIS — M4800 Spinal stenosis, site unspecified: Secondary | ICD-10-CM | POA: Diagnosis not present

## 2020-11-15 DIAGNOSIS — E785 Hyperlipidemia, unspecified: Secondary | ICD-10-CM | POA: Diagnosis not present

## 2020-11-15 DIAGNOSIS — E7800 Pure hypercholesterolemia, unspecified: Secondary | ICD-10-CM | POA: Diagnosis not present

## 2020-11-15 DIAGNOSIS — F325 Major depressive disorder, single episode, in full remission: Secondary | ICD-10-CM | POA: Diagnosis not present

## 2020-11-15 DIAGNOSIS — Z7901 Long term (current) use of anticoagulants: Secondary | ICD-10-CM | POA: Diagnosis not present

## 2020-11-15 DIAGNOSIS — M80052D Age-related osteoporosis with current pathological fracture, left femur, subsequent encounter for fracture with routine healing: Secondary | ICD-10-CM | POA: Diagnosis not present

## 2020-11-15 DIAGNOSIS — M25462 Effusion, left knee: Secondary | ICD-10-CM | POA: Diagnosis not present

## 2020-11-15 DIAGNOSIS — Z79891 Long term (current) use of opiate analgesic: Secondary | ICD-10-CM | POA: Diagnosis not present

## 2020-11-15 DIAGNOSIS — I2510 Atherosclerotic heart disease of native coronary artery without angina pectoris: Secondary | ICD-10-CM | POA: Diagnosis not present

## 2020-11-15 DIAGNOSIS — I110 Hypertensive heart disease with heart failure: Secondary | ICD-10-CM | POA: Diagnosis not present

## 2020-11-15 DIAGNOSIS — D62 Acute posthemorrhagic anemia: Secondary | ICD-10-CM | POA: Diagnosis not present

## 2020-11-15 DIAGNOSIS — M85852 Other specified disorders of bone density and structure, left thigh: Secondary | ICD-10-CM | POA: Diagnosis not present

## 2020-11-15 DIAGNOSIS — M479 Spondylosis, unspecified: Secondary | ICD-10-CM | POA: Diagnosis not present

## 2020-11-19 DIAGNOSIS — R443 Hallucinations, unspecified: Secondary | ICD-10-CM | POA: Diagnosis not present

## 2020-11-19 DIAGNOSIS — N390 Urinary tract infection, site not specified: Secondary | ICD-10-CM | POA: Diagnosis not present

## 2020-11-19 DIAGNOSIS — R8290 Unspecified abnormal findings in urine: Secondary | ICD-10-CM | POA: Diagnosis not present

## 2020-11-20 ENCOUNTER — Other Ambulatory Visit: Payer: Self-pay | Admitting: Internal Medicine

## 2020-11-20 DIAGNOSIS — R413 Other amnesia: Secondary | ICD-10-CM

## 2020-11-24 ENCOUNTER — Other Ambulatory Visit: Payer: Self-pay | Admitting: Cardiovascular Disease

## 2020-11-25 DIAGNOSIS — S72142D Displaced intertrochanteric fracture of left femur, subsequent encounter for closed fracture with routine healing: Secondary | ICD-10-CM | POA: Diagnosis not present

## 2020-11-25 DIAGNOSIS — T8484XD Pain due to internal orthopedic prosthetic devices, implants and grafts, subsequent encounter: Secondary | ICD-10-CM | POA: Diagnosis not present

## 2020-11-29 DIAGNOSIS — Z723 Lack of physical exercise: Secondary | ICD-10-CM | POA: Diagnosis not present

## 2020-11-29 DIAGNOSIS — M1712 Unilateral primary osteoarthritis, left knee: Secondary | ICD-10-CM | POA: Diagnosis not present

## 2020-12-04 ENCOUNTER — Ambulatory Visit
Admission: RE | Admit: 2020-12-04 | Discharge: 2020-12-04 | Disposition: A | Discharge status: Home / Self Care | Payer: Medicare Other | Source: Ambulatory Visit | Attending: Internal Medicine | Admitting: Internal Medicine

## 2020-12-04 DIAGNOSIS — R413 Other amnesia: Secondary | ICD-10-CM | POA: Diagnosis not present

## 2020-12-04 DIAGNOSIS — I6782 Cerebral ischemia: Secondary | ICD-10-CM | POA: Diagnosis not present

## 2020-12-04 DIAGNOSIS — J3489 Other specified disorders of nose and nasal sinuses: Secondary | ICD-10-CM | POA: Diagnosis not present

## 2020-12-04 MED ORDER — GADOBENATE DIMEGLUMINE 529 MG/ML IV SOLN
15.0000 mL | Freq: Once | INTRAVENOUS | Status: AC | PRN
  Administered 2020-12-04: 15 mL via INTRAVENOUS

## 2020-12-09 DIAGNOSIS — M48061 Spinal stenosis, lumbar region without neurogenic claudication: Secondary | ICD-10-CM | POA: Diagnosis not present

## 2020-12-09 DIAGNOSIS — M25552 Pain in left hip: Secondary | ICD-10-CM | POA: Diagnosis not present

## 2020-12-09 DIAGNOSIS — R29898 Other symptoms and signs involving the musculoskeletal system: Secondary | ICD-10-CM | POA: Diagnosis not present

## 2020-12-23 DIAGNOSIS — S72142D Displaced intertrochanteric fracture of left femur, subsequent encounter for closed fracture with routine healing: Secondary | ICD-10-CM | POA: Diagnosis not present

## 2020-12-23 DIAGNOSIS — T8484XD Pain due to internal orthopedic prosthetic devices, implants and grafts, subsequent encounter: Secondary | ICD-10-CM | POA: Diagnosis not present

## 2020-12-25 ENCOUNTER — Other Ambulatory Visit: Payer: Self-pay | Admitting: Orthopedic Surgery

## 2020-12-25 DIAGNOSIS — M5416 Radiculopathy, lumbar region: Secondary | ICD-10-CM

## 2020-12-28 ENCOUNTER — Other Ambulatory Visit: Payer: Self-pay | Admitting: Orthopedic Surgery

## 2020-12-28 DIAGNOSIS — M5136 Other intervertebral disc degeneration, lumbar region: Secondary | ICD-10-CM

## 2020-12-28 DIAGNOSIS — M47816 Spondylosis without myelopathy or radiculopathy, lumbar region: Secondary | ICD-10-CM

## 2020-12-29 ENCOUNTER — Other Ambulatory Visit: Payer: Self-pay | Admitting: Internal Medicine

## 2020-12-30 DIAGNOSIS — M1712 Unilateral primary osteoarthritis, left knee: Secondary | ICD-10-CM | POA: Diagnosis not present

## 2020-12-30 DIAGNOSIS — Z723 Lack of physical exercise: Secondary | ICD-10-CM | POA: Diagnosis not present

## 2021-01-04 ENCOUNTER — Ambulatory Visit
Admission: RE | Admit: 2021-01-04 | Discharge: 2021-01-04 | Disposition: A | Discharge status: Home / Self Care | Payer: Medicare Other | Source: Ambulatory Visit | Attending: Orthopedic Surgery | Admitting: Orthopedic Surgery

## 2021-01-04 DIAGNOSIS — M48061 Spinal stenosis, lumbar region without neurogenic claudication: Secondary | ICD-10-CM | POA: Diagnosis not present

## 2021-01-04 DIAGNOSIS — I5032 Chronic diastolic (congestive) heart failure: Secondary | ICD-10-CM | POA: Diagnosis not present

## 2021-01-04 DIAGNOSIS — M47816 Spondylosis without myelopathy or radiculopathy, lumbar region: Secondary | ICD-10-CM

## 2021-01-04 DIAGNOSIS — E1169 Type 2 diabetes mellitus with other specified complication: Secondary | ICD-10-CM | POA: Diagnosis not present

## 2021-01-04 DIAGNOSIS — R2689 Other abnormalities of gait and mobility: Secondary | ICD-10-CM | POA: Diagnosis not present

## 2021-01-04 DIAGNOSIS — I130 Hypertensive heart and chronic kidney disease with heart failure and stage 1 through stage 4 chronic kidney disease, or unspecified chronic kidney disease: Secondary | ICD-10-CM | POA: Diagnosis not present

## 2021-01-04 DIAGNOSIS — M5136 Other intervertebral disc degeneration, lumbar region: Secondary | ICD-10-CM

## 2021-01-11 ENCOUNTER — Ambulatory Visit
Admission: RE | Admit: 2021-01-11 | Discharge: 2021-01-11 | Disposition: A | Discharge status: Home / Self Care | Payer: Medicare Other | Source: Ambulatory Visit | Attending: Orthopedic Surgery | Admitting: Orthopedic Surgery

## 2021-01-11 DIAGNOSIS — M5416 Radiculopathy, lumbar region: Secondary | ICD-10-CM

## 2021-01-11 DIAGNOSIS — M47817 Spondylosis without myelopathy or radiculopathy, lumbosacral region: Secondary | ICD-10-CM | POA: Diagnosis not present

## 2021-01-11 MED ORDER — IOPAMIDOL (ISOVUE-M 200) INJECTION 41%
1.0000 mL | Freq: Once | INTRAMUSCULAR | Status: AC
  Administered 2021-01-11: 1 mL via EPIDURAL

## 2021-01-11 MED ORDER — METHYLPREDNISOLONE ACETATE 40 MG/ML INJ SUSP (RADIOLOG
80.0000 mg | Freq: Once | INTRAMUSCULAR | Status: AC
  Administered 2021-01-11: 80 mg via EPIDURAL

## 2021-01-11 NOTE — Discharge Instructions (Signed)
Post Procedure Spinal Discharge Instruction Sheet  You may resume a regular diet and any medications that you routinely take (including pain medications).  No driving day of procedure.  Light activity throughout the rest of the day.  Do not do any strenuous work, exercise, bending or lifting.  The day following the procedure, you can resume normal physical activity but you should refrain from exercising or physical therapy for at least three days thereafter.   Common Side Effects:  Headaches- take your usual medications as directed by your physician.  Increase your fluid intake.  Caffeinated beverages may be helpful.  Lie flat in bed until your headache resolves.  Restlessness or inability to sleep- you may have trouble sleeping for the next few days.  Ask your referring physician if you need any medication for sleep.  Facial flushing or redness- should subside within a few days.  Increased pain- a temporary increase in pain a day or two following your procedure is not unusual.  Take your pain medication as prescribed by your referring physician.  Leg cramps  Please contact our office at [phone] for the following symptoms: Fever greater than 100 degrees. Headaches unresolved with medication after 2-3 days. Increased swelling, pain, or redness at injection site.   Thank you for visiting [facility] today.    YOU MAY RESUME YOUR ELIQUIS 24 HOURS AFTER INJECTION

## 2021-01-27 DIAGNOSIS — M79652 Pain in left thigh: Secondary | ICD-10-CM | POA: Diagnosis not present

## 2021-01-27 DIAGNOSIS — T8484XD Pain due to internal orthopedic prosthetic devices, implants and grafts, subsequent encounter: Secondary | ICD-10-CM | POA: Diagnosis not present

## 2021-01-27 DIAGNOSIS — S72142D Displaced intertrochanteric fracture of left femur, subsequent encounter for closed fracture with routine healing: Secondary | ICD-10-CM | POA: Diagnosis not present

## 2021-01-29 DIAGNOSIS — M1712 Unilateral primary osteoarthritis, left knee: Secondary | ICD-10-CM | POA: Diagnosis not present

## 2021-01-29 DIAGNOSIS — Z723 Lack of physical exercise: Secondary | ICD-10-CM | POA: Diagnosis not present

## 2021-02-02 DIAGNOSIS — R2681 Unsteadiness on feet: Secondary | ICD-10-CM | POA: Diagnosis not present

## 2021-02-02 DIAGNOSIS — M79605 Pain in left leg: Secondary | ICD-10-CM | POA: Diagnosis not present

## 2021-02-02 DIAGNOSIS — R2689 Other abnormalities of gait and mobility: Secondary | ICD-10-CM | POA: Diagnosis not present

## 2021-02-09 DIAGNOSIS — M79605 Pain in left leg: Secondary | ICD-10-CM | POA: Diagnosis not present

## 2021-02-09 DIAGNOSIS — R2681 Unsteadiness on feet: Secondary | ICD-10-CM | POA: Diagnosis not present

## 2021-02-09 DIAGNOSIS — R2689 Other abnormalities of gait and mobility: Secondary | ICD-10-CM | POA: Diagnosis not present

## 2021-02-12 DIAGNOSIS — R2689 Other abnormalities of gait and mobility: Secondary | ICD-10-CM | POA: Diagnosis not present

## 2021-02-12 DIAGNOSIS — R2681 Unsteadiness on feet: Secondary | ICD-10-CM | POA: Diagnosis not present

## 2021-02-12 DIAGNOSIS — M79605 Pain in left leg: Secondary | ICD-10-CM | POA: Diagnosis not present

## 2021-02-16 DIAGNOSIS — M79605 Pain in left leg: Secondary | ICD-10-CM | POA: Diagnosis not present

## 2021-02-16 DIAGNOSIS — R2689 Other abnormalities of gait and mobility: Secondary | ICD-10-CM | POA: Diagnosis not present

## 2021-02-16 DIAGNOSIS — R2681 Unsteadiness on feet: Secondary | ICD-10-CM | POA: Diagnosis not present

## 2021-02-18 DIAGNOSIS — M79605 Pain in left leg: Secondary | ICD-10-CM | POA: Diagnosis not present

## 2021-02-18 DIAGNOSIS — R2689 Other abnormalities of gait and mobility: Secondary | ICD-10-CM | POA: Diagnosis not present

## 2021-02-18 DIAGNOSIS — R2681 Unsteadiness on feet: Secondary | ICD-10-CM | POA: Diagnosis not present

## 2021-02-23 DIAGNOSIS — M79605 Pain in left leg: Secondary | ICD-10-CM | POA: Diagnosis not present

## 2021-02-23 DIAGNOSIS — R2689 Other abnormalities of gait and mobility: Secondary | ICD-10-CM | POA: Diagnosis not present

## 2021-02-23 DIAGNOSIS — R2681 Unsteadiness on feet: Secondary | ICD-10-CM | POA: Diagnosis not present

## 2021-02-26 DIAGNOSIS — M79605 Pain in left leg: Secondary | ICD-10-CM | POA: Diagnosis not present

## 2021-02-26 DIAGNOSIS — R2689 Other abnormalities of gait and mobility: Secondary | ICD-10-CM | POA: Diagnosis not present

## 2021-02-26 DIAGNOSIS — R2681 Unsteadiness on feet: Secondary | ICD-10-CM | POA: Diagnosis not present

## 2021-03-01 DIAGNOSIS — Z723 Lack of physical exercise: Secondary | ICD-10-CM | POA: Diagnosis not present

## 2021-03-01 DIAGNOSIS — M1712 Unilateral primary osteoarthritis, left knee: Secondary | ICD-10-CM | POA: Diagnosis not present

## 2021-03-12 ENCOUNTER — Other Ambulatory Visit: Payer: Self-pay | Admitting: Orthopedic Surgery

## 2021-03-16 DIAGNOSIS — R2689 Other abnormalities of gait and mobility: Secondary | ICD-10-CM | POA: Diagnosis not present

## 2021-03-16 DIAGNOSIS — M79605 Pain in left leg: Secondary | ICD-10-CM | POA: Diagnosis not present

## 2021-03-16 DIAGNOSIS — R2681 Unsteadiness on feet: Secondary | ICD-10-CM | POA: Diagnosis not present

## 2021-03-18 DIAGNOSIS — M79605 Pain in left leg: Secondary | ICD-10-CM | POA: Diagnosis not present

## 2021-03-18 DIAGNOSIS — R2689 Other abnormalities of gait and mobility: Secondary | ICD-10-CM | POA: Diagnosis not present

## 2021-03-18 DIAGNOSIS — R2681 Unsteadiness on feet: Secondary | ICD-10-CM | POA: Diagnosis not present

## 2021-03-23 DIAGNOSIS — R2689 Other abnormalities of gait and mobility: Secondary | ICD-10-CM | POA: Diagnosis not present

## 2021-03-23 DIAGNOSIS — R2681 Unsteadiness on feet: Secondary | ICD-10-CM | POA: Diagnosis not present

## 2021-03-23 DIAGNOSIS — M79605 Pain in left leg: Secondary | ICD-10-CM | POA: Diagnosis not present

## 2021-03-25 DIAGNOSIS — R2681 Unsteadiness on feet: Secondary | ICD-10-CM | POA: Diagnosis not present

## 2021-03-25 DIAGNOSIS — R2689 Other abnormalities of gait and mobility: Secondary | ICD-10-CM | POA: Diagnosis not present

## 2021-03-25 DIAGNOSIS — M79605 Pain in left leg: Secondary | ICD-10-CM | POA: Diagnosis not present

## 2021-03-30 DIAGNOSIS — M79605 Pain in left leg: Secondary | ICD-10-CM | POA: Diagnosis not present

## 2021-03-30 DIAGNOSIS — R2681 Unsteadiness on feet: Secondary | ICD-10-CM | POA: Diagnosis not present

## 2021-03-30 DIAGNOSIS — R2689 Other abnormalities of gait and mobility: Secondary | ICD-10-CM | POA: Diagnosis not present

## 2021-04-06 DIAGNOSIS — R2689 Other abnormalities of gait and mobility: Secondary | ICD-10-CM | POA: Diagnosis not present

## 2021-04-06 DIAGNOSIS — R2681 Unsteadiness on feet: Secondary | ICD-10-CM | POA: Diagnosis not present

## 2021-04-06 DIAGNOSIS — M79605 Pain in left leg: Secondary | ICD-10-CM | POA: Diagnosis not present

## 2021-04-07 DIAGNOSIS — M79652 Pain in left thigh: Secondary | ICD-10-CM | POA: Diagnosis not present

## 2021-04-07 DIAGNOSIS — S72142D Displaced intertrochanteric fracture of left femur, subsequent encounter for closed fracture with routine healing: Secondary | ICD-10-CM | POA: Diagnosis not present

## 2021-04-07 DIAGNOSIS — T8484XD Pain due to internal orthopedic prosthetic devices, implants and grafts, subsequent encounter: Secondary | ICD-10-CM | POA: Diagnosis not present

## 2021-04-08 DIAGNOSIS — M79605 Pain in left leg: Secondary | ICD-10-CM | POA: Diagnosis not present

## 2021-04-08 DIAGNOSIS — R2681 Unsteadiness on feet: Secondary | ICD-10-CM | POA: Diagnosis not present

## 2021-04-08 DIAGNOSIS — R2689 Other abnormalities of gait and mobility: Secondary | ICD-10-CM | POA: Diagnosis not present

## 2021-04-13 ENCOUNTER — Other Ambulatory Visit: Payer: Self-pay | Admitting: Cardiovascular Disease

## 2021-04-20 DIAGNOSIS — R2681 Unsteadiness on feet: Secondary | ICD-10-CM | POA: Diagnosis not present

## 2021-04-20 DIAGNOSIS — M79605 Pain in left leg: Secondary | ICD-10-CM | POA: Diagnosis not present

## 2021-04-20 DIAGNOSIS — R2689 Other abnormalities of gait and mobility: Secondary | ICD-10-CM | POA: Diagnosis not present

## 2021-04-22 DIAGNOSIS — R2689 Other abnormalities of gait and mobility: Secondary | ICD-10-CM | POA: Diagnosis not present

## 2021-04-22 DIAGNOSIS — M79605 Pain in left leg: Secondary | ICD-10-CM | POA: Diagnosis not present

## 2021-04-22 DIAGNOSIS — R2681 Unsteadiness on feet: Secondary | ICD-10-CM | POA: Diagnosis not present

## 2021-04-26 DIAGNOSIS — M859 Disorder of bone density and structure, unspecified: Secondary | ICD-10-CM | POA: Diagnosis not present

## 2021-04-26 DIAGNOSIS — E785 Hyperlipidemia, unspecified: Secondary | ICD-10-CM | POA: Diagnosis not present

## 2021-04-26 DIAGNOSIS — E1169 Type 2 diabetes mellitus with other specified complication: Secondary | ICD-10-CM | POA: Diagnosis not present

## 2021-04-27 DIAGNOSIS — R2681 Unsteadiness on feet: Secondary | ICD-10-CM | POA: Diagnosis not present

## 2021-04-27 DIAGNOSIS — R2689 Other abnormalities of gait and mobility: Secondary | ICD-10-CM | POA: Diagnosis not present

## 2021-04-27 DIAGNOSIS — M79605 Pain in left leg: Secondary | ICD-10-CM | POA: Diagnosis not present

## 2021-04-29 DIAGNOSIS — M79605 Pain in left leg: Secondary | ICD-10-CM | POA: Diagnosis not present

## 2021-04-29 DIAGNOSIS — R2689 Other abnormalities of gait and mobility: Secondary | ICD-10-CM | POA: Diagnosis not present

## 2021-04-29 DIAGNOSIS — R2681 Unsteadiness on feet: Secondary | ICD-10-CM | POA: Diagnosis not present

## 2021-05-03 DIAGNOSIS — Z0000 Encounter for general adult medical examination without abnormal findings: Secondary | ICD-10-CM | POA: Diagnosis not present

## 2021-05-03 DIAGNOSIS — R82998 Other abnormal findings in urine: Secondary | ICD-10-CM | POA: Diagnosis not present

## 2021-05-03 DIAGNOSIS — R413 Other amnesia: Secondary | ICD-10-CM | POA: Diagnosis not present

## 2021-05-03 DIAGNOSIS — M79605 Pain in left leg: Secondary | ICD-10-CM | POA: Diagnosis not present

## 2021-05-03 DIAGNOSIS — I130 Hypertensive heart and chronic kidney disease with heart failure and stage 1 through stage 4 chronic kidney disease, or unspecified chronic kidney disease: Secondary | ICD-10-CM | POA: Diagnosis not present

## 2021-05-04 DIAGNOSIS — M79605 Pain in left leg: Secondary | ICD-10-CM | POA: Diagnosis not present

## 2021-05-04 DIAGNOSIS — R2689 Other abnormalities of gait and mobility: Secondary | ICD-10-CM | POA: Diagnosis not present

## 2021-05-04 DIAGNOSIS — R2681 Unsteadiness on feet: Secondary | ICD-10-CM | POA: Diagnosis not present

## 2021-05-06 DIAGNOSIS — M79605 Pain in left leg: Secondary | ICD-10-CM | POA: Diagnosis not present

## 2021-05-06 DIAGNOSIS — R2689 Other abnormalities of gait and mobility: Secondary | ICD-10-CM | POA: Diagnosis not present

## 2021-05-06 DIAGNOSIS — R2681 Unsteadiness on feet: Secondary | ICD-10-CM | POA: Diagnosis not present

## 2021-05-11 DIAGNOSIS — M79605 Pain in left leg: Secondary | ICD-10-CM | POA: Diagnosis not present

## 2021-05-11 DIAGNOSIS — R2681 Unsteadiness on feet: Secondary | ICD-10-CM | POA: Diagnosis not present

## 2021-05-11 DIAGNOSIS — R2689 Other abnormalities of gait and mobility: Secondary | ICD-10-CM | POA: Diagnosis not present

## 2021-05-13 DIAGNOSIS — R2689 Other abnormalities of gait and mobility: Secondary | ICD-10-CM | POA: Diagnosis not present

## 2021-05-13 DIAGNOSIS — R2681 Unsteadiness on feet: Secondary | ICD-10-CM | POA: Diagnosis not present

## 2021-05-13 DIAGNOSIS — M79605 Pain in left leg: Secondary | ICD-10-CM | POA: Diagnosis not present

## 2021-05-14 DIAGNOSIS — M5416 Radiculopathy, lumbar region: Secondary | ICD-10-CM | POA: Diagnosis not present

## 2021-05-18 DIAGNOSIS — R2689 Other abnormalities of gait and mobility: Secondary | ICD-10-CM | POA: Diagnosis not present

## 2021-05-18 DIAGNOSIS — R2681 Unsteadiness on feet: Secondary | ICD-10-CM | POA: Diagnosis not present

## 2021-05-18 DIAGNOSIS — M79605 Pain in left leg: Secondary | ICD-10-CM | POA: Diagnosis not present

## 2021-05-20 DIAGNOSIS — R2681 Unsteadiness on feet: Secondary | ICD-10-CM | POA: Diagnosis not present

## 2021-05-20 DIAGNOSIS — M79605 Pain in left leg: Secondary | ICD-10-CM | POA: Diagnosis not present

## 2021-05-20 DIAGNOSIS — R2689 Other abnormalities of gait and mobility: Secondary | ICD-10-CM | POA: Diagnosis not present

## 2021-05-24 DIAGNOSIS — E785 Hyperlipidemia, unspecified: Secondary | ICD-10-CM | POA: Diagnosis not present

## 2021-05-24 DIAGNOSIS — I130 Hypertensive heart and chronic kidney disease with heart failure and stage 1 through stage 4 chronic kidney disease, or unspecified chronic kidney disease: Secondary | ICD-10-CM | POA: Diagnosis not present

## 2021-05-24 DIAGNOSIS — N182 Chronic kidney disease, stage 2 (mild): Secondary | ICD-10-CM | POA: Diagnosis not present

## 2021-05-24 DIAGNOSIS — I10 Essential (primary) hypertension: Secondary | ICD-10-CM | POA: Diagnosis not present

## 2021-06-01 DIAGNOSIS — N390 Urinary tract infection, site not specified: Secondary | ICD-10-CM | POA: Diagnosis not present

## 2021-06-02 DIAGNOSIS — R2681 Unsteadiness on feet: Secondary | ICD-10-CM | POA: Diagnosis not present

## 2021-06-02 DIAGNOSIS — M79605 Pain in left leg: Secondary | ICD-10-CM | POA: Diagnosis not present

## 2021-06-02 DIAGNOSIS — R2689 Other abnormalities of gait and mobility: Secondary | ICD-10-CM | POA: Diagnosis not present

## 2021-06-04 DIAGNOSIS — M79605 Pain in left leg: Secondary | ICD-10-CM | POA: Diagnosis not present

## 2021-06-04 DIAGNOSIS — R2681 Unsteadiness on feet: Secondary | ICD-10-CM | POA: Diagnosis not present

## 2021-06-04 DIAGNOSIS — R2689 Other abnormalities of gait and mobility: Secondary | ICD-10-CM | POA: Diagnosis not present

## 2021-06-08 DIAGNOSIS — M48062 Spinal stenosis, lumbar region with neurogenic claudication: Secondary | ICD-10-CM | POA: Diagnosis not present

## 2021-06-08 DIAGNOSIS — M5136 Other intervertebral disc degeneration, lumbar region: Secondary | ICD-10-CM | POA: Diagnosis not present

## 2021-06-10 DIAGNOSIS — R2689 Other abnormalities of gait and mobility: Secondary | ICD-10-CM | POA: Diagnosis not present

## 2021-06-10 DIAGNOSIS — R2681 Unsteadiness on feet: Secondary | ICD-10-CM | POA: Diagnosis not present

## 2021-06-10 DIAGNOSIS — M79605 Pain in left leg: Secondary | ICD-10-CM | POA: Diagnosis not present

## 2021-06-15 DIAGNOSIS — R2681 Unsteadiness on feet: Secondary | ICD-10-CM | POA: Diagnosis not present

## 2021-06-15 DIAGNOSIS — M79605 Pain in left leg: Secondary | ICD-10-CM | POA: Diagnosis not present

## 2021-06-15 DIAGNOSIS — R2689 Other abnormalities of gait and mobility: Secondary | ICD-10-CM | POA: Diagnosis not present

## 2021-06-23 DIAGNOSIS — M8580 Other specified disorders of bone density and structure, unspecified site: Secondary | ICD-10-CM | POA: Diagnosis not present

## 2021-06-23 DIAGNOSIS — I10 Essential (primary) hypertension: Secondary | ICD-10-CM | POA: Diagnosis not present

## 2021-06-23 DIAGNOSIS — E785 Hyperlipidemia, unspecified: Secondary | ICD-10-CM | POA: Diagnosis not present

## 2021-06-23 DIAGNOSIS — I130 Hypertensive heart and chronic kidney disease with heart failure and stage 1 through stage 4 chronic kidney disease, or unspecified chronic kidney disease: Secondary | ICD-10-CM | POA: Diagnosis not present

## 2021-06-24 DIAGNOSIS — R2689 Other abnormalities of gait and mobility: Secondary | ICD-10-CM | POA: Diagnosis not present

## 2021-06-24 DIAGNOSIS — M79605 Pain in left leg: Secondary | ICD-10-CM | POA: Diagnosis not present

## 2021-06-24 DIAGNOSIS — R2681 Unsteadiness on feet: Secondary | ICD-10-CM | POA: Diagnosis not present

## 2021-06-28 DIAGNOSIS — R2689 Other abnormalities of gait and mobility: Secondary | ICD-10-CM | POA: Diagnosis not present

## 2021-06-28 DIAGNOSIS — R2681 Unsteadiness on feet: Secondary | ICD-10-CM | POA: Diagnosis not present

## 2021-06-28 DIAGNOSIS — M79605 Pain in left leg: Secondary | ICD-10-CM | POA: Diagnosis not present

## 2021-06-30 DIAGNOSIS — R2689 Other abnormalities of gait and mobility: Secondary | ICD-10-CM | POA: Diagnosis not present

## 2021-06-30 DIAGNOSIS — M79605 Pain in left leg: Secondary | ICD-10-CM | POA: Diagnosis not present

## 2021-06-30 DIAGNOSIS — R2681 Unsteadiness on feet: Secondary | ICD-10-CM | POA: Diagnosis not present

## 2021-07-05 DIAGNOSIS — M79605 Pain in left leg: Secondary | ICD-10-CM | POA: Diagnosis not present

## 2021-07-05 DIAGNOSIS — R2689 Other abnormalities of gait and mobility: Secondary | ICD-10-CM | POA: Diagnosis not present

## 2021-07-05 DIAGNOSIS — R2681 Unsteadiness on feet: Secondary | ICD-10-CM | POA: Diagnosis not present

## 2021-07-06 DIAGNOSIS — C44729 Squamous cell carcinoma of skin of left lower limb, including hip: Secondary | ICD-10-CM | POA: Diagnosis not present

## 2021-07-06 DIAGNOSIS — C44629 Squamous cell carcinoma of skin of left upper limb, including shoulder: Secondary | ICD-10-CM | POA: Diagnosis not present

## 2021-07-06 DIAGNOSIS — L570 Actinic keratosis: Secondary | ICD-10-CM | POA: Diagnosis not present

## 2021-07-07 DIAGNOSIS — R2681 Unsteadiness on feet: Secondary | ICD-10-CM | POA: Diagnosis not present

## 2021-07-07 DIAGNOSIS — R2689 Other abnormalities of gait and mobility: Secondary | ICD-10-CM | POA: Diagnosis not present

## 2021-07-07 DIAGNOSIS — M79605 Pain in left leg: Secondary | ICD-10-CM | POA: Diagnosis not present

## 2021-07-14 DIAGNOSIS — R2689 Other abnormalities of gait and mobility: Secondary | ICD-10-CM | POA: Diagnosis not present

## 2021-07-14 DIAGNOSIS — M79605 Pain in left leg: Secondary | ICD-10-CM | POA: Diagnosis not present

## 2021-07-14 DIAGNOSIS — R2681 Unsteadiness on feet: Secondary | ICD-10-CM | POA: Diagnosis not present

## 2021-07-28 DIAGNOSIS — R2681 Unsteadiness on feet: Secondary | ICD-10-CM | POA: Diagnosis not present

## 2021-07-28 DIAGNOSIS — M79605 Pain in left leg: Secondary | ICD-10-CM | POA: Diagnosis not present

## 2021-07-28 DIAGNOSIS — R2689 Other abnormalities of gait and mobility: Secondary | ICD-10-CM | POA: Diagnosis not present

## 2021-08-02 DIAGNOSIS — R2689 Other abnormalities of gait and mobility: Secondary | ICD-10-CM | POA: Diagnosis not present

## 2021-08-02 DIAGNOSIS — R2681 Unsteadiness on feet: Secondary | ICD-10-CM | POA: Diagnosis not present

## 2021-08-02 DIAGNOSIS — M79605 Pain in left leg: Secondary | ICD-10-CM | POA: Diagnosis not present

## 2021-08-20 DIAGNOSIS — M5416 Radiculopathy, lumbar region: Secondary | ICD-10-CM | POA: Diagnosis not present

## 2021-08-26 DIAGNOSIS — R2689 Other abnormalities of gait and mobility: Secondary | ICD-10-CM | POA: Diagnosis not present

## 2021-08-26 DIAGNOSIS — M79605 Pain in left leg: Secondary | ICD-10-CM | POA: Diagnosis not present

## 2021-08-26 DIAGNOSIS — R2681 Unsteadiness on feet: Secondary | ICD-10-CM | POA: Diagnosis not present

## 2021-08-30 DIAGNOSIS — M5416 Radiculopathy, lumbar region: Secondary | ICD-10-CM | POA: Diagnosis not present

## 2021-08-30 DIAGNOSIS — M5116 Intervertebral disc disorders with radiculopathy, lumbar region: Secondary | ICD-10-CM | POA: Diagnosis not present

## 2021-08-31 DIAGNOSIS — M79605 Pain in left leg: Secondary | ICD-10-CM | POA: Diagnosis not present

## 2021-08-31 DIAGNOSIS — R2689 Other abnormalities of gait and mobility: Secondary | ICD-10-CM | POA: Diagnosis not present

## 2021-08-31 DIAGNOSIS — R2681 Unsteadiness on feet: Secondary | ICD-10-CM | POA: Diagnosis not present

## 2021-09-09 DIAGNOSIS — R2681 Unsteadiness on feet: Secondary | ICD-10-CM | POA: Diagnosis not present

## 2021-09-09 DIAGNOSIS — R2689 Other abnormalities of gait and mobility: Secondary | ICD-10-CM | POA: Diagnosis not present

## 2021-09-09 DIAGNOSIS — N390 Urinary tract infection, site not specified: Secondary | ICD-10-CM | POA: Diagnosis not present

## 2021-09-09 DIAGNOSIS — M79605 Pain in left leg: Secondary | ICD-10-CM | POA: Diagnosis not present

## 2021-09-14 DIAGNOSIS — R2689 Other abnormalities of gait and mobility: Secondary | ICD-10-CM | POA: Diagnosis not present

## 2021-09-14 DIAGNOSIS — R2681 Unsteadiness on feet: Secondary | ICD-10-CM | POA: Diagnosis not present

## 2021-09-14 DIAGNOSIS — M79605 Pain in left leg: Secondary | ICD-10-CM | POA: Diagnosis not present

## 2021-09-16 DIAGNOSIS — C44729 Squamous cell carcinoma of skin of left lower limb, including hip: Secondary | ICD-10-CM | POA: Diagnosis not present

## 2021-09-16 DIAGNOSIS — C44629 Squamous cell carcinoma of skin of left upper limb, including shoulder: Secondary | ICD-10-CM | POA: Diagnosis not present

## 2021-09-28 DIAGNOSIS — R2681 Unsteadiness on feet: Secondary | ICD-10-CM | POA: Diagnosis not present

## 2021-09-28 DIAGNOSIS — R2689 Other abnormalities of gait and mobility: Secondary | ICD-10-CM | POA: Diagnosis not present

## 2021-09-28 DIAGNOSIS — M79605 Pain in left leg: Secondary | ICD-10-CM | POA: Diagnosis not present

## 2021-09-30 DIAGNOSIS — R2689 Other abnormalities of gait and mobility: Secondary | ICD-10-CM | POA: Diagnosis not present

## 2021-09-30 DIAGNOSIS — M79605 Pain in left leg: Secondary | ICD-10-CM | POA: Diagnosis not present

## 2021-09-30 DIAGNOSIS — R2681 Unsteadiness on feet: Secondary | ICD-10-CM | POA: Diagnosis not present

## 2021-10-05 DIAGNOSIS — R2681 Unsteadiness on feet: Secondary | ICD-10-CM | POA: Diagnosis not present

## 2021-10-05 DIAGNOSIS — M79605 Pain in left leg: Secondary | ICD-10-CM | POA: Diagnosis not present

## 2021-10-05 DIAGNOSIS — R2689 Other abnormalities of gait and mobility: Secondary | ICD-10-CM | POA: Diagnosis not present

## 2021-10-07 DIAGNOSIS — R2681 Unsteadiness on feet: Secondary | ICD-10-CM | POA: Diagnosis not present

## 2021-10-07 DIAGNOSIS — M79605 Pain in left leg: Secondary | ICD-10-CM | POA: Diagnosis not present

## 2021-10-07 DIAGNOSIS — R2689 Other abnormalities of gait and mobility: Secondary | ICD-10-CM | POA: Diagnosis not present

## 2021-10-12 DIAGNOSIS — M79605 Pain in left leg: Secondary | ICD-10-CM | POA: Diagnosis not present

## 2021-10-12 DIAGNOSIS — R2689 Other abnormalities of gait and mobility: Secondary | ICD-10-CM | POA: Diagnosis not present

## 2021-10-12 DIAGNOSIS — R2681 Unsteadiness on feet: Secondary | ICD-10-CM | POA: Diagnosis not present

## 2021-10-14 ENCOUNTER — Other Ambulatory Visit: Payer: Self-pay | Admitting: Orthopedic Surgery

## 2021-10-14 DIAGNOSIS — R2689 Other abnormalities of gait and mobility: Secondary | ICD-10-CM | POA: Diagnosis not present

## 2021-10-14 DIAGNOSIS — R2681 Unsteadiness on feet: Secondary | ICD-10-CM | POA: Diagnosis not present

## 2021-10-14 DIAGNOSIS — M79605 Pain in left leg: Secondary | ICD-10-CM | POA: Diagnosis not present

## 2021-10-18 DIAGNOSIS — R2689 Other abnormalities of gait and mobility: Secondary | ICD-10-CM | POA: Diagnosis not present

## 2021-10-18 DIAGNOSIS — R2681 Unsteadiness on feet: Secondary | ICD-10-CM | POA: Diagnosis not present

## 2021-10-18 DIAGNOSIS — M79605 Pain in left leg: Secondary | ICD-10-CM | POA: Diagnosis not present

## 2021-10-26 DIAGNOSIS — R2681 Unsteadiness on feet: Secondary | ICD-10-CM | POA: Diagnosis not present

## 2021-10-26 DIAGNOSIS — R2689 Other abnormalities of gait and mobility: Secondary | ICD-10-CM | POA: Diagnosis not present

## 2021-10-26 DIAGNOSIS — M79605 Pain in left leg: Secondary | ICD-10-CM | POA: Diagnosis not present

## 2021-10-28 DIAGNOSIS — R2681 Unsteadiness on feet: Secondary | ICD-10-CM | POA: Diagnosis not present

## 2021-10-28 DIAGNOSIS — M79605 Pain in left leg: Secondary | ICD-10-CM | POA: Diagnosis not present

## 2021-10-28 DIAGNOSIS — R2689 Other abnormalities of gait and mobility: Secondary | ICD-10-CM | POA: Diagnosis not present

## 2021-11-02 DIAGNOSIS — M79605 Pain in left leg: Secondary | ICD-10-CM | POA: Diagnosis not present

## 2021-11-02 DIAGNOSIS — R2689 Other abnormalities of gait and mobility: Secondary | ICD-10-CM | POA: Diagnosis not present

## 2021-11-02 DIAGNOSIS — R2681 Unsteadiness on feet: Secondary | ICD-10-CM | POA: Diagnosis not present

## 2021-11-03 DIAGNOSIS — M40209 Unspecified kyphosis, site unspecified: Secondary | ICD-10-CM | POA: Diagnosis not present

## 2021-11-03 DIAGNOSIS — I130 Hypertensive heart and chronic kidney disease with heart failure and stage 1 through stage 4 chronic kidney disease, or unspecified chronic kidney disease: Secondary | ICD-10-CM | POA: Diagnosis not present

## 2021-11-03 DIAGNOSIS — E1169 Type 2 diabetes mellitus with other specified complication: Secondary | ICD-10-CM | POA: Diagnosis not present

## 2021-11-03 DIAGNOSIS — M48061 Spinal stenosis, lumbar region without neurogenic claudication: Secondary | ICD-10-CM | POA: Diagnosis not present

## 2021-11-04 DIAGNOSIS — R2681 Unsteadiness on feet: Secondary | ICD-10-CM | POA: Diagnosis not present

## 2021-11-04 DIAGNOSIS — R2689 Other abnormalities of gait and mobility: Secondary | ICD-10-CM | POA: Diagnosis not present

## 2021-11-04 DIAGNOSIS — M79605 Pain in left leg: Secondary | ICD-10-CM | POA: Diagnosis not present

## 2021-11-09 DIAGNOSIS — R2689 Other abnormalities of gait and mobility: Secondary | ICD-10-CM | POA: Diagnosis not present

## 2021-11-09 DIAGNOSIS — R2681 Unsteadiness on feet: Secondary | ICD-10-CM | POA: Diagnosis not present

## 2021-11-09 DIAGNOSIS — M79605 Pain in left leg: Secondary | ICD-10-CM | POA: Diagnosis not present

## 2021-11-11 DIAGNOSIS — M79605 Pain in left leg: Secondary | ICD-10-CM | POA: Diagnosis not present

## 2021-11-11 DIAGNOSIS — R2681 Unsteadiness on feet: Secondary | ICD-10-CM | POA: Diagnosis not present

## 2021-11-11 DIAGNOSIS — R2689 Other abnormalities of gait and mobility: Secondary | ICD-10-CM | POA: Diagnosis not present

## 2021-11-16 DIAGNOSIS — R2681 Unsteadiness on feet: Secondary | ICD-10-CM | POA: Diagnosis not present

## 2021-11-16 DIAGNOSIS — M79605 Pain in left leg: Secondary | ICD-10-CM | POA: Diagnosis not present

## 2021-11-16 DIAGNOSIS — R2689 Other abnormalities of gait and mobility: Secondary | ICD-10-CM | POA: Diagnosis not present

## 2021-11-18 DIAGNOSIS — R2681 Unsteadiness on feet: Secondary | ICD-10-CM | POA: Diagnosis not present

## 2021-11-18 DIAGNOSIS — R2689 Other abnormalities of gait and mobility: Secondary | ICD-10-CM | POA: Diagnosis not present

## 2021-11-18 DIAGNOSIS — M79605 Pain in left leg: Secondary | ICD-10-CM | POA: Diagnosis not present

## 2021-11-23 ENCOUNTER — Other Ambulatory Visit: Payer: Self-pay | Admitting: Internal Medicine

## 2021-11-23 DIAGNOSIS — M79605 Pain in left leg: Secondary | ICD-10-CM | POA: Diagnosis not present

## 2021-11-23 DIAGNOSIS — Z1231 Encounter for screening mammogram for malignant neoplasm of breast: Secondary | ICD-10-CM

## 2021-11-23 DIAGNOSIS — R2681 Unsteadiness on feet: Secondary | ICD-10-CM | POA: Diagnosis not present

## 2021-11-23 DIAGNOSIS — R2689 Other abnormalities of gait and mobility: Secondary | ICD-10-CM | POA: Diagnosis not present

## 2021-11-25 DIAGNOSIS — M79605 Pain in left leg: Secondary | ICD-10-CM | POA: Diagnosis not present

## 2021-11-25 DIAGNOSIS — R2681 Unsteadiness on feet: Secondary | ICD-10-CM | POA: Diagnosis not present

## 2021-11-25 DIAGNOSIS — R2689 Other abnormalities of gait and mobility: Secondary | ICD-10-CM | POA: Diagnosis not present

## 2021-11-27 ENCOUNTER — Other Ambulatory Visit: Payer: Self-pay | Admitting: Orthopedic Surgery

## 2021-11-30 DIAGNOSIS — R2681 Unsteadiness on feet: Secondary | ICD-10-CM | POA: Diagnosis not present

## 2021-11-30 DIAGNOSIS — M79605 Pain in left leg: Secondary | ICD-10-CM | POA: Diagnosis not present

## 2021-11-30 DIAGNOSIS — R2689 Other abnormalities of gait and mobility: Secondary | ICD-10-CM | POA: Diagnosis not present

## 2021-12-01 ENCOUNTER — Ambulatory Visit
Admission: RE | Admit: 2021-12-01 | Discharge: 2021-12-01 | Disposition: A | Discharge status: Home / Self Care | Payer: Medicare Other | Source: Ambulatory Visit | Attending: Internal Medicine | Admitting: Internal Medicine

## 2021-12-01 DIAGNOSIS — Z1231 Encounter for screening mammogram for malignant neoplasm of breast: Secondary | ICD-10-CM

## 2021-12-02 ENCOUNTER — Other Ambulatory Visit: Payer: Self-pay | Admitting: Orthopedic Surgery

## 2021-12-03 ENCOUNTER — Other Ambulatory Visit: Payer: Self-pay | Admitting: Internal Medicine

## 2021-12-03 DIAGNOSIS — R928 Other abnormal and inconclusive findings on diagnostic imaging of breast: Secondary | ICD-10-CM

## 2021-12-06 DIAGNOSIS — E1169 Type 2 diabetes mellitus with other specified complication: Secondary | ICD-10-CM | POA: Diagnosis not present

## 2021-12-06 DIAGNOSIS — I5032 Chronic diastolic (congestive) heart failure: Secondary | ICD-10-CM | POA: Diagnosis not present

## 2021-12-06 DIAGNOSIS — M79605 Pain in left leg: Secondary | ICD-10-CM | POA: Diagnosis not present

## 2021-12-07 DIAGNOSIS — R2689 Other abnormalities of gait and mobility: Secondary | ICD-10-CM | POA: Diagnosis not present

## 2021-12-07 DIAGNOSIS — M79605 Pain in left leg: Secondary | ICD-10-CM | POA: Diagnosis not present

## 2021-12-07 DIAGNOSIS — R2681 Unsteadiness on feet: Secondary | ICD-10-CM | POA: Diagnosis not present

## 2021-12-09 DIAGNOSIS — M79605 Pain in left leg: Secondary | ICD-10-CM | POA: Diagnosis not present

## 2021-12-09 DIAGNOSIS — R2681 Unsteadiness on feet: Secondary | ICD-10-CM | POA: Diagnosis not present

## 2021-12-09 DIAGNOSIS — R2689 Other abnormalities of gait and mobility: Secondary | ICD-10-CM | POA: Diagnosis not present

## 2021-12-14 ENCOUNTER — Other Ambulatory Visit: Payer: Medicare Other

## 2021-12-14 DIAGNOSIS — M79605 Pain in left leg: Secondary | ICD-10-CM | POA: Diagnosis not present

## 2021-12-14 DIAGNOSIS — R2689 Other abnormalities of gait and mobility: Secondary | ICD-10-CM | POA: Diagnosis not present

## 2021-12-14 DIAGNOSIS — R2681 Unsteadiness on feet: Secondary | ICD-10-CM | POA: Diagnosis not present

## 2021-12-16 DIAGNOSIS — R2681 Unsteadiness on feet: Secondary | ICD-10-CM | POA: Diagnosis not present

## 2021-12-16 DIAGNOSIS — M79605 Pain in left leg: Secondary | ICD-10-CM | POA: Diagnosis not present

## 2021-12-16 DIAGNOSIS — N390 Urinary tract infection, site not specified: Secondary | ICD-10-CM | POA: Diagnosis not present

## 2021-12-16 DIAGNOSIS — R2689 Other abnormalities of gait and mobility: Secondary | ICD-10-CM | POA: Diagnosis not present

## 2021-12-17 ENCOUNTER — Ambulatory Visit: Payer: Medicare Other

## 2021-12-17 ENCOUNTER — Ambulatory Visit
Admission: RE | Admit: 2021-12-17 | Discharge: 2021-12-17 | Disposition: A | Discharge status: Home / Self Care | Payer: Medicare Other | Source: Ambulatory Visit | Attending: Internal Medicine | Admitting: Internal Medicine

## 2021-12-17 DIAGNOSIS — R928 Other abnormal and inconclusive findings on diagnostic imaging of breast: Secondary | ICD-10-CM

## 2021-12-17 DIAGNOSIS — R922 Inconclusive mammogram: Secondary | ICD-10-CM | POA: Diagnosis not present

## 2021-12-21 DIAGNOSIS — M79605 Pain in left leg: Secondary | ICD-10-CM | POA: Diagnosis not present

## 2021-12-21 DIAGNOSIS — R2681 Unsteadiness on feet: Secondary | ICD-10-CM | POA: Diagnosis not present

## 2021-12-21 DIAGNOSIS — R2689 Other abnormalities of gait and mobility: Secondary | ICD-10-CM | POA: Diagnosis not present

## 2021-12-22 DIAGNOSIS — I130 Hypertensive heart and chronic kidney disease with heart failure and stage 1 through stage 4 chronic kidney disease, or unspecified chronic kidney disease: Secondary | ICD-10-CM | POA: Diagnosis not present

## 2021-12-22 DIAGNOSIS — N182 Chronic kidney disease, stage 2 (mild): Secondary | ICD-10-CM | POA: Diagnosis not present

## 2021-12-22 DIAGNOSIS — E1169 Type 2 diabetes mellitus with other specified complication: Secondary | ICD-10-CM | POA: Diagnosis not present

## 2021-12-22 DIAGNOSIS — E785 Hyperlipidemia, unspecified: Secondary | ICD-10-CM | POA: Diagnosis not present

## 2022-01-04 DIAGNOSIS — R350 Frequency of micturition: Secondary | ICD-10-CM | POA: Diagnosis not present

## 2022-01-04 DIAGNOSIS — N390 Urinary tract infection, site not specified: Secondary | ICD-10-CM | POA: Diagnosis not present

## 2022-01-04 DIAGNOSIS — M79604 Pain in right leg: Secondary | ICD-10-CM | POA: Diagnosis not present

## 2022-01-04 DIAGNOSIS — I872 Venous insufficiency (chronic) (peripheral): Secondary | ICD-10-CM | POA: Diagnosis not present

## 2022-01-04 DIAGNOSIS — M48061 Spinal stenosis, lumbar region without neurogenic claudication: Secondary | ICD-10-CM | POA: Diagnosis not present

## 2022-01-11 DIAGNOSIS — R2689 Other abnormalities of gait and mobility: Secondary | ICD-10-CM | POA: Diagnosis not present

## 2022-01-11 DIAGNOSIS — M79605 Pain in left leg: Secondary | ICD-10-CM | POA: Diagnosis not present

## 2022-01-11 DIAGNOSIS — R2681 Unsteadiness on feet: Secondary | ICD-10-CM | POA: Diagnosis not present

## 2022-01-13 DIAGNOSIS — R2681 Unsteadiness on feet: Secondary | ICD-10-CM | POA: Diagnosis not present

## 2022-01-13 DIAGNOSIS — R2689 Other abnormalities of gait and mobility: Secondary | ICD-10-CM | POA: Diagnosis not present

## 2022-01-13 DIAGNOSIS — M79605 Pain in left leg: Secondary | ICD-10-CM | POA: Diagnosis not present

## 2022-01-18 DIAGNOSIS — M79605 Pain in left leg: Secondary | ICD-10-CM | POA: Diagnosis not present

## 2022-01-18 DIAGNOSIS — R2689 Other abnormalities of gait and mobility: Secondary | ICD-10-CM | POA: Diagnosis not present

## 2022-01-18 DIAGNOSIS — R2681 Unsteadiness on feet: Secondary | ICD-10-CM | POA: Diagnosis not present

## 2022-01-20 DIAGNOSIS — R2689 Other abnormalities of gait and mobility: Secondary | ICD-10-CM | POA: Diagnosis not present

## 2022-01-20 DIAGNOSIS — M79605 Pain in left leg: Secondary | ICD-10-CM | POA: Diagnosis not present

## 2022-01-20 DIAGNOSIS — R2681 Unsteadiness on feet: Secondary | ICD-10-CM | POA: Diagnosis not present

## 2022-01-24 DIAGNOSIS — I5032 Chronic diastolic (congestive) heart failure: Secondary | ICD-10-CM | POA: Diagnosis not present

## 2022-01-24 DIAGNOSIS — E1169 Type 2 diabetes mellitus with other specified complication: Secondary | ICD-10-CM | POA: Diagnosis not present

## 2022-01-24 DIAGNOSIS — M79605 Pain in left leg: Secondary | ICD-10-CM | POA: Diagnosis not present

## 2022-01-27 DIAGNOSIS — R2681 Unsteadiness on feet: Secondary | ICD-10-CM | POA: Diagnosis not present

## 2022-01-27 DIAGNOSIS — R2689 Other abnormalities of gait and mobility: Secondary | ICD-10-CM | POA: Diagnosis not present

## 2022-01-27 DIAGNOSIS — M79605 Pain in left leg: Secondary | ICD-10-CM | POA: Diagnosis not present

## 2022-02-01 DIAGNOSIS — M79605 Pain in left leg: Secondary | ICD-10-CM | POA: Diagnosis not present

## 2022-02-01 DIAGNOSIS — R2681 Unsteadiness on feet: Secondary | ICD-10-CM | POA: Diagnosis not present

## 2022-02-01 DIAGNOSIS — R2689 Other abnormalities of gait and mobility: Secondary | ICD-10-CM | POA: Diagnosis not present

## 2022-02-03 DIAGNOSIS — M79605 Pain in left leg: Secondary | ICD-10-CM | POA: Diagnosis not present

## 2022-02-03 DIAGNOSIS — R2689 Other abnormalities of gait and mobility: Secondary | ICD-10-CM | POA: Diagnosis not present

## 2022-02-03 DIAGNOSIS — R2681 Unsteadiness on feet: Secondary | ICD-10-CM | POA: Diagnosis not present

## 2022-02-08 DIAGNOSIS — R2689 Other abnormalities of gait and mobility: Secondary | ICD-10-CM | POA: Diagnosis not present

## 2022-02-08 DIAGNOSIS — R2681 Unsteadiness on feet: Secondary | ICD-10-CM | POA: Diagnosis not present

## 2022-02-08 DIAGNOSIS — M79605 Pain in left leg: Secondary | ICD-10-CM | POA: Diagnosis not present

## 2022-02-10 DIAGNOSIS — R2689 Other abnormalities of gait and mobility: Secondary | ICD-10-CM | POA: Diagnosis not present

## 2022-02-10 DIAGNOSIS — M79605 Pain in left leg: Secondary | ICD-10-CM | POA: Diagnosis not present

## 2022-02-10 DIAGNOSIS — R2681 Unsteadiness on feet: Secondary | ICD-10-CM | POA: Diagnosis not present

## 2022-02-15 DIAGNOSIS — R2681 Unsteadiness on feet: Secondary | ICD-10-CM | POA: Diagnosis not present

## 2022-02-15 DIAGNOSIS — R2689 Other abnormalities of gait and mobility: Secondary | ICD-10-CM | POA: Diagnosis not present

## 2022-02-15 DIAGNOSIS — M79605 Pain in left leg: Secondary | ICD-10-CM | POA: Diagnosis not present

## 2022-02-17 DIAGNOSIS — R2689 Other abnormalities of gait and mobility: Secondary | ICD-10-CM | POA: Diagnosis not present

## 2022-02-17 DIAGNOSIS — M79605 Pain in left leg: Secondary | ICD-10-CM | POA: Diagnosis not present

## 2022-02-17 DIAGNOSIS — R2681 Unsteadiness on feet: Secondary | ICD-10-CM | POA: Diagnosis not present

## 2022-02-18 DIAGNOSIS — I10 Essential (primary) hypertension: Secondary | ICD-10-CM | POA: Diagnosis not present

## 2022-02-18 DIAGNOSIS — R82998 Other abnormal findings in urine: Secondary | ICD-10-CM | POA: Diagnosis not present

## 2022-02-18 DIAGNOSIS — E1169 Type 2 diabetes mellitus with other specified complication: Secondary | ICD-10-CM | POA: Diagnosis not present

## 2022-02-24 DIAGNOSIS — R2689 Other abnormalities of gait and mobility: Secondary | ICD-10-CM | POA: Diagnosis not present

## 2022-02-24 DIAGNOSIS — R2681 Unsteadiness on feet: Secondary | ICD-10-CM | POA: Diagnosis not present

## 2022-02-24 DIAGNOSIS — M79605 Pain in left leg: Secondary | ICD-10-CM | POA: Diagnosis not present

## 2022-03-01 DIAGNOSIS — M79605 Pain in left leg: Secondary | ICD-10-CM | POA: Diagnosis not present

## 2022-03-01 DIAGNOSIS — R2681 Unsteadiness on feet: Secondary | ICD-10-CM | POA: Diagnosis not present

## 2022-03-01 DIAGNOSIS — R2689 Other abnormalities of gait and mobility: Secondary | ICD-10-CM | POA: Diagnosis not present

## 2022-03-03 DIAGNOSIS — R2681 Unsteadiness on feet: Secondary | ICD-10-CM | POA: Diagnosis not present

## 2022-03-03 DIAGNOSIS — M79605 Pain in left leg: Secondary | ICD-10-CM | POA: Diagnosis not present

## 2022-03-03 DIAGNOSIS — R2689 Other abnormalities of gait and mobility: Secondary | ICD-10-CM | POA: Diagnosis not present

## 2022-03-07 DIAGNOSIS — D485 Neoplasm of uncertain behavior of skin: Secondary | ICD-10-CM | POA: Diagnosis not present

## 2022-03-07 DIAGNOSIS — D0462 Carcinoma in situ of skin of left upper limb, including shoulder: Secondary | ICD-10-CM | POA: Diagnosis not present

## 2022-03-07 DIAGNOSIS — L570 Actinic keratosis: Secondary | ICD-10-CM | POA: Diagnosis not present

## 2022-03-08 DIAGNOSIS — R2689 Other abnormalities of gait and mobility: Secondary | ICD-10-CM | POA: Diagnosis not present

## 2022-03-08 DIAGNOSIS — R2681 Unsteadiness on feet: Secondary | ICD-10-CM | POA: Diagnosis not present

## 2022-03-08 DIAGNOSIS — M79605 Pain in left leg: Secondary | ICD-10-CM | POA: Diagnosis not present

## 2022-03-10 DIAGNOSIS — R2689 Other abnormalities of gait and mobility: Secondary | ICD-10-CM | POA: Diagnosis not present

## 2022-03-10 DIAGNOSIS — R2681 Unsteadiness on feet: Secondary | ICD-10-CM | POA: Diagnosis not present

## 2022-03-10 DIAGNOSIS — M79605 Pain in left leg: Secondary | ICD-10-CM | POA: Diagnosis not present

## 2022-03-15 DIAGNOSIS — M79605 Pain in left leg: Secondary | ICD-10-CM | POA: Diagnosis not present

## 2022-03-15 DIAGNOSIS — R2681 Unsteadiness on feet: Secondary | ICD-10-CM | POA: Diagnosis not present

## 2022-03-15 DIAGNOSIS — R2689 Other abnormalities of gait and mobility: Secondary | ICD-10-CM | POA: Diagnosis not present

## 2022-03-17 DIAGNOSIS — R2689 Other abnormalities of gait and mobility: Secondary | ICD-10-CM | POA: Diagnosis not present

## 2022-03-17 DIAGNOSIS — M79605 Pain in left leg: Secondary | ICD-10-CM | POA: Diagnosis not present

## 2022-03-17 DIAGNOSIS — R2681 Unsteadiness on feet: Secondary | ICD-10-CM | POA: Diagnosis not present

## 2022-03-22 DIAGNOSIS — R2681 Unsteadiness on feet: Secondary | ICD-10-CM | POA: Diagnosis not present

## 2022-03-22 DIAGNOSIS — R2689 Other abnormalities of gait and mobility: Secondary | ICD-10-CM | POA: Diagnosis not present

## 2022-03-22 DIAGNOSIS — M79605 Pain in left leg: Secondary | ICD-10-CM | POA: Diagnosis not present

## 2022-03-23 DIAGNOSIS — C44629 Squamous cell carcinoma of skin of left upper limb, including shoulder: Secondary | ICD-10-CM | POA: Diagnosis not present

## 2022-03-24 DIAGNOSIS — M79605 Pain in left leg: Secondary | ICD-10-CM | POA: Diagnosis not present

## 2022-03-24 DIAGNOSIS — R2689 Other abnormalities of gait and mobility: Secondary | ICD-10-CM | POA: Diagnosis not present

## 2022-03-24 DIAGNOSIS — R2681 Unsteadiness on feet: Secondary | ICD-10-CM | POA: Diagnosis not present

## 2022-03-29 DIAGNOSIS — R2689 Other abnormalities of gait and mobility: Secondary | ICD-10-CM | POA: Diagnosis not present

## 2022-03-29 DIAGNOSIS — R2681 Unsteadiness on feet: Secondary | ICD-10-CM | POA: Diagnosis not present

## 2022-03-29 DIAGNOSIS — M79605 Pain in left leg: Secondary | ICD-10-CM | POA: Diagnosis not present

## 2022-03-31 DIAGNOSIS — M79605 Pain in left leg: Secondary | ICD-10-CM | POA: Diagnosis not present

## 2022-03-31 DIAGNOSIS — R2681 Unsteadiness on feet: Secondary | ICD-10-CM | POA: Diagnosis not present

## 2022-03-31 DIAGNOSIS — R2689 Other abnormalities of gait and mobility: Secondary | ICD-10-CM | POA: Diagnosis not present

## 2022-04-06 ENCOUNTER — Encounter (HOSPITAL_COMMUNITY): Payer: Self-pay | Admitting: *Deleted

## 2022-04-06 ENCOUNTER — Other Ambulatory Visit: Payer: Self-pay

## 2022-04-06 ENCOUNTER — Inpatient Hospital Stay (HOSPITAL_COMMUNITY)
Admission: EM | Admit: 2022-04-06 | Discharge: 2022-04-13 | DRG: 871 | Disposition: A | Discharge status: Hospice | Payer: Medicare Other | Attending: Internal Medicine | Admitting: Internal Medicine

## 2022-04-06 ENCOUNTER — Emergency Department (HOSPITAL_COMMUNITY): Payer: Medicare Other

## 2022-04-06 DIAGNOSIS — Z01818 Encounter for other preprocedural examination: Secondary | ICD-10-CM | POA: Diagnosis not present

## 2022-04-06 DIAGNOSIS — G249 Dystonia, unspecified: Secondary | ICD-10-CM | POA: Diagnosis present

## 2022-04-06 DIAGNOSIS — Z880 Allergy status to penicillin: Secondary | ICD-10-CM

## 2022-04-06 DIAGNOSIS — J189 Pneumonia, unspecified organism: Secondary | ICD-10-CM

## 2022-04-06 DIAGNOSIS — S22080A Wedge compression fracture of T11-T12 vertebra, initial encounter for closed fracture: Secondary | ICD-10-CM | POA: Diagnosis not present

## 2022-04-06 DIAGNOSIS — I739 Peripheral vascular disease, unspecified: Secondary | ICD-10-CM | POA: Diagnosis not present

## 2022-04-06 DIAGNOSIS — I4510 Unspecified right bundle-branch block: Secondary | ICD-10-CM | POA: Diagnosis present

## 2022-04-06 DIAGNOSIS — I110 Hypertensive heart disease with heart failure: Secondary | ICD-10-CM | POA: Diagnosis present

## 2022-04-06 DIAGNOSIS — E7800 Pure hypercholesterolemia, unspecified: Secondary | ICD-10-CM | POA: Diagnosis present

## 2022-04-06 DIAGNOSIS — Z91013 Allergy to seafood: Secondary | ICD-10-CM

## 2022-04-06 DIAGNOSIS — Z86718 Personal history of other venous thrombosis and embolism: Secondary | ICD-10-CM

## 2022-04-06 DIAGNOSIS — E440 Moderate protein-calorie malnutrition: Secondary | ICD-10-CM | POA: Diagnosis not present

## 2022-04-06 DIAGNOSIS — Z885 Allergy status to narcotic agent status: Secondary | ICD-10-CM

## 2022-04-06 DIAGNOSIS — Z0189 Encounter for other specified special examinations: Secondary | ICD-10-CM | POA: Diagnosis not present

## 2022-04-06 DIAGNOSIS — Z85828 Personal history of other malignant neoplasm of skin: Secondary | ICD-10-CM | POA: Diagnosis not present

## 2022-04-06 DIAGNOSIS — F05 Delirium due to known physiological condition: Secondary | ICD-10-CM | POA: Diagnosis present

## 2022-04-06 DIAGNOSIS — I2510 Atherosclerotic heart disease of native coronary artery without angina pectoris: Secondary | ICD-10-CM | POA: Diagnosis present

## 2022-04-06 DIAGNOSIS — D638 Anemia in other chronic diseases classified elsewhere: Secondary | ICD-10-CM | POA: Diagnosis not present

## 2022-04-06 DIAGNOSIS — K651 Peritoneal abscess: Secondary | ICD-10-CM | POA: Diagnosis not present

## 2022-04-06 DIAGNOSIS — Z7983 Long term (current) use of bisphosphonates: Secondary | ICD-10-CM

## 2022-04-06 DIAGNOSIS — Z853 Personal history of malignant neoplasm of breast: Secondary | ICD-10-CM

## 2022-04-06 DIAGNOSIS — G9340 Encephalopathy, unspecified: Secondary | ICD-10-CM

## 2022-04-06 DIAGNOSIS — R410 Disorientation, unspecified: Secondary | ICD-10-CM

## 2022-04-06 DIAGNOSIS — I10 Essential (primary) hypertension: Secondary | ICD-10-CM | POA: Diagnosis not present

## 2022-04-06 DIAGNOSIS — R627 Adult failure to thrive: Secondary | ICD-10-CM | POA: Diagnosis present

## 2022-04-06 DIAGNOSIS — K5700 Diverticulitis of small intestine with perforation and abscess without bleeding: Secondary | ICD-10-CM | POA: Diagnosis not present

## 2022-04-06 DIAGNOSIS — A419 Sepsis, unspecified organism: Secondary | ICD-10-CM | POA: Diagnosis not present

## 2022-04-06 DIAGNOSIS — Z20822 Contact with and (suspected) exposure to covid-19: Secondary | ICD-10-CM | POA: Diagnosis present

## 2022-04-06 DIAGNOSIS — M19011 Primary osteoarthritis, right shoulder: Secondary | ICD-10-CM | POA: Diagnosis not present

## 2022-04-06 DIAGNOSIS — I872 Venous insufficiency (chronic) (peripheral): Secondary | ICD-10-CM | POA: Diagnosis present

## 2022-04-06 DIAGNOSIS — F1021 Alcohol dependence, in remission: Secondary | ICD-10-CM | POA: Diagnosis present

## 2022-04-06 DIAGNOSIS — I5042 Chronic combined systolic (congestive) and diastolic (congestive) heart failure: Secondary | ICD-10-CM | POA: Diagnosis not present

## 2022-04-06 DIAGNOSIS — K449 Diaphragmatic hernia without obstruction or gangrene: Secondary | ICD-10-CM | POA: Diagnosis present

## 2022-04-06 DIAGNOSIS — K5730 Diverticulosis of large intestine without perforation or abscess without bleeding: Secondary | ICD-10-CM | POA: Diagnosis not present

## 2022-04-06 DIAGNOSIS — Z681 Body mass index (BMI) 19 or less, adult: Secondary | ICD-10-CM

## 2022-04-06 DIAGNOSIS — R531 Weakness: Secondary | ICD-10-CM

## 2022-04-06 DIAGNOSIS — K5712 Diverticulitis of small intestine without perforation or abscess without bleeding: Secondary | ICD-10-CM | POA: Diagnosis present

## 2022-04-06 DIAGNOSIS — Z923 Personal history of irradiation: Secondary | ICD-10-CM

## 2022-04-06 DIAGNOSIS — I440 Atrioventricular block, first degree: Secondary | ICD-10-CM | POA: Diagnosis present

## 2022-04-06 DIAGNOSIS — I441 Atrioventricular block, second degree: Secondary | ICD-10-CM | POA: Diagnosis not present

## 2022-04-06 DIAGNOSIS — M810 Age-related osteoporosis without current pathological fracture: Secondary | ICD-10-CM | POA: Diagnosis present

## 2022-04-06 DIAGNOSIS — F0390 Unspecified dementia without behavioral disturbance: Secondary | ICD-10-CM | POA: Diagnosis not present

## 2022-04-06 DIAGNOSIS — I5022 Chronic systolic (congestive) heart failure: Secondary | ICD-10-CM | POA: Diagnosis not present

## 2022-04-06 DIAGNOSIS — Z66 Do not resuscitate: Secondary | ICD-10-CM | POA: Diagnosis present

## 2022-04-06 DIAGNOSIS — R6520 Severe sepsis without septic shock: Secondary | ICD-10-CM | POA: Diagnosis not present

## 2022-04-06 DIAGNOSIS — I951 Orthostatic hypotension: Secondary | ICD-10-CM | POA: Diagnosis present

## 2022-04-06 DIAGNOSIS — R188 Other ascites: Secondary | ICD-10-CM | POA: Diagnosis not present

## 2022-04-06 DIAGNOSIS — Z86711 Personal history of pulmonary embolism: Secondary | ICD-10-CM

## 2022-04-06 DIAGNOSIS — R509 Fever, unspecified: Secondary | ICD-10-CM | POA: Diagnosis not present

## 2022-04-06 DIAGNOSIS — M1990 Unspecified osteoarthritis, unspecified site: Secondary | ICD-10-CM | POA: Diagnosis present

## 2022-04-06 DIAGNOSIS — Z841 Family history of disorders of kidney and ureter: Secondary | ICD-10-CM

## 2022-04-06 DIAGNOSIS — M542 Cervicalgia: Secondary | ICD-10-CM | POA: Diagnosis not present

## 2022-04-06 DIAGNOSIS — G9341 Metabolic encephalopathy: Secondary | ICD-10-CM | POA: Diagnosis present

## 2022-04-06 DIAGNOSIS — Z515 Encounter for palliative care: Secondary | ICD-10-CM

## 2022-04-06 DIAGNOSIS — Z01810 Encounter for preprocedural cardiovascular examination: Secondary | ICD-10-CM

## 2022-04-06 DIAGNOSIS — K5900 Constipation, unspecified: Secondary | ICD-10-CM | POA: Diagnosis present

## 2022-04-06 DIAGNOSIS — Z7901 Long term (current) use of anticoagulants: Secondary | ICD-10-CM

## 2022-04-06 DIAGNOSIS — Z96651 Presence of right artificial knee joint: Secondary | ICD-10-CM | POA: Diagnosis present

## 2022-04-06 DIAGNOSIS — R0902 Hypoxemia: Secondary | ICD-10-CM | POA: Diagnosis not present

## 2022-04-06 DIAGNOSIS — I700 Atherosclerosis of aorta: Secondary | ICD-10-CM | POA: Diagnosis not present

## 2022-04-06 DIAGNOSIS — Z79899 Other long term (current) drug therapy: Secondary | ICD-10-CM

## 2022-04-06 DIAGNOSIS — Z7189 Other specified counseling: Secondary | ICD-10-CM | POA: Diagnosis not present

## 2022-04-06 DIAGNOSIS — M19012 Primary osteoarthritis, left shoulder: Secondary | ICD-10-CM | POA: Diagnosis not present

## 2022-04-06 LAB — COMPREHENSIVE METABOLIC PANEL
ALT: 24 U/L (ref 0–44)
AST: 31 U/L (ref 15–41)
Albumin: 3.5 g/dL (ref 3.5–5.0)
Alkaline Phosphatase: 63 U/L (ref 38–126)
Anion gap: 8 (ref 5–15)
BUN: 19 mg/dL (ref 8–23)
CO2: 25 mmol/L (ref 22–32)
Calcium: 9 mg/dL (ref 8.9–10.3)
Chloride: 105 mmol/L (ref 98–111)
Creatinine, Ser: 0.65 mg/dL (ref 0.44–1.00)
GFR, Estimated: >60 mL/min (ref >60)
Glucose, Bld: 106 mg/dL — ABNORMAL HIGH (ref 70–99)
Potassium: 3.8 mmol/L (ref 3.5–5.1)
Sodium: 138 mmol/L (ref 135–145)
Total Bilirubin: 1.5 mg/dL — ABNORMAL HIGH (ref 0.3–1.2)
Total Protein: 6.6 g/dL (ref 6.5–8.1)

## 2022-04-06 LAB — SARS CORONAVIRUS 2 BY RT PCR: SARS Coronavirus 2 by RT PCR: NEGATIVE

## 2022-04-06 LAB — CBC WITH DIFFERENTIAL/PLATELET
Abs Immature Granulocytes: 0.05 10*3/uL (ref 0.00–0.07)
Basophils Absolute: 0.1 10*3/uL (ref 0.0–0.1)
Basophils Relative: 0 %
Eosinophils Absolute: 0 10*3/uL (ref 0.0–0.5)
Eosinophils Relative: 0 %
HCT: 38.3 % (ref 36.0–46.0)
Hemoglobin: 12.5 g/dL (ref 12.0–15.0)
Immature Granulocytes: 0 %
Lymphocytes Relative: 17 %
Lymphs Abs: 2.1 10*3/uL (ref 0.7–4.0)
MCH: 30.5 pg (ref 26.0–34.0)
MCHC: 32.6 g/dL (ref 30.0–36.0)
MCV: 93.4 fL (ref 80.0–100.0)
Monocytes Absolute: 1.3 10*3/uL — ABNORMAL HIGH (ref 0.1–1.0)
Monocytes Relative: 11 %
Neutro Abs: 8.9 10*3/uL — ABNORMAL HIGH (ref 1.7–7.7)
Neutrophils Relative %: 72 %
Platelets: 168 10*3/uL (ref 150–400)
RBC: 4.1 MIL/uL (ref 3.87–5.11)
RDW: 14 % (ref 11.5–15.5)
WBC: 12.4 10*3/uL — ABNORMAL HIGH (ref 4.0–10.5)
nRBC: 0 % (ref 0.0–0.2)

## 2022-04-06 LAB — URINALYSIS, ROUTINE W REFLEX MICROSCOPIC
Bilirubin Urine: NEGATIVE
Glucose, UA: NEGATIVE mg/dL
Hgb urine dipstick: NEGATIVE
Ketones, ur: NEGATIVE mg/dL
Leukocytes,Ua: NEGATIVE
Nitrite: NEGATIVE
Protein, ur: NEGATIVE mg/dL
Specific Gravity, Urine: 1.015 (ref 1.005–1.030)
pH: 7 (ref 5.0–8.0)

## 2022-04-06 LAB — PROTIME-INR
INR: 1.5 — ABNORMAL HIGH (ref 0.8–1.2)
Prothrombin Time: 17.5 seconds — ABNORMAL HIGH (ref 11.4–15.2)

## 2022-04-06 LAB — APTT: aPTT: 40 seconds — ABNORMAL HIGH (ref 24–36)

## 2022-04-06 LAB — LACTIC ACID, PLASMA: Lactic Acid, Venous: 0.8 mmol/L (ref 0.5–1.9)

## 2022-04-06 MED ORDER — LACTATED RINGERS IV SOLN: INTRAVENOUS | Status: AC

## 2022-04-06 MED ORDER — SODIUM CHLORIDE 0.9 % IV SOLN
2.0000 g | INTRAVENOUS | Status: AC
  Administered 2022-04-07 – 2022-04-10 (×4): 2 g via INTRAVENOUS
  Filled 2022-04-06 (×4): qty 20

## 2022-04-06 MED ORDER — ADULT MULTIVITAMIN W/MINERALS CH
1.0000 | ORAL_TABLET | Freq: Every day | ORAL | Status: DC
  Administered 2022-04-07: 1 via ORAL
  Filled 2022-04-06: qty 1

## 2022-04-06 MED ORDER — GABAPENTIN 300 MG PO CAPS
300.0000 mg | ORAL_CAPSULE | Freq: Four times a day (QID) | ORAL | Status: DC
  Administered 2022-04-06 – 2022-04-11 (×8): 300 mg via ORAL
  Filled 2022-04-06 (×10): qty 1

## 2022-04-06 MED ORDER — LACTATED RINGERS IV BOLUS (SEPSIS)
500.0000 mL | Freq: Once | INTRAVENOUS | Status: AC
  Administered 2022-04-06: 500 mL via INTRAVENOUS

## 2022-04-06 MED ORDER — DOXYCYCLINE HYCLATE 100 MG PO TABS
100.0000 mg | ORAL_TABLET | Freq: Once | ORAL | Status: AC
  Administered 2022-04-06: 100 mg via ORAL
  Filled 2022-04-06: qty 1

## 2022-04-06 MED ORDER — ROSUVASTATIN CALCIUM 20 MG PO TABS
20.0000 mg | ORAL_TABLET | Freq: Every day | ORAL | Status: DC
  Administered 2022-04-06 – 2022-04-07 (×2): 20 mg via ORAL
  Filled 2022-04-06 (×2): qty 1

## 2022-04-06 MED ORDER — ESCITALOPRAM OXALATE 20 MG PO TABS
20.0000 mg | ORAL_TABLET | Freq: Every day | ORAL | Status: DC
  Administered 2022-04-07 – 2022-04-12 (×3): 20 mg via ORAL
  Filled 2022-04-06: qty 2
  Filled 2022-04-06 (×3): qty 1

## 2022-04-06 MED ORDER — APIXABAN 5 MG PO TABS
5.0000 mg | ORAL_TABLET | Freq: Two times a day (BID) | ORAL | Status: DC
  Administered 2022-04-06 – 2022-04-07 (×2): 5 mg via ORAL
  Filled 2022-04-06 (×2): qty 1

## 2022-04-06 MED ORDER — SENNOSIDES-DOCUSATE SODIUM 8.6-50 MG PO TABS: 1.0000 | ORAL_TABLET | Freq: Every evening | ORAL | Status: DC | PRN

## 2022-04-06 MED ORDER — TAMSULOSIN HCL 0.4 MG PO CAPS
0.4000 mg | ORAL_CAPSULE | Freq: Every day | ORAL | Status: DC
  Administered 2022-04-07 – 2022-04-12 (×3): 0.4 mg via ORAL
  Filled 2022-04-06 (×4): qty 1

## 2022-04-06 MED ORDER — OYSTER SHELL CALCIUM/D3 500-5 MG-MCG PO TABS
1.0000 | ORAL_TABLET | Freq: Every day | ORAL | Status: DC
  Administered 2022-04-07 – 2022-04-11 (×2): 1 via ORAL
  Filled 2022-04-06 (×3): qty 1

## 2022-04-06 MED ORDER — DOXYCYCLINE HYCLATE 100 MG PO TABS
100.0000 mg | ORAL_TABLET | Freq: Two times a day (BID) | ORAL | Status: DC
  Administered 2022-04-07: 100 mg via ORAL
  Filled 2022-04-06: qty 1

## 2022-04-06 MED ORDER — ACETAMINOPHEN 500 MG PO TABS
1000.0000 mg | ORAL_TABLET | Freq: Once | ORAL | Status: AC
  Administered 2022-04-06: 1000 mg via ORAL
  Filled 2022-04-06: qty 2

## 2022-04-06 MED ORDER — SODIUM CHLORIDE 0.9 % IV SOLN
1.0000 g | Freq: Once | INTRAVENOUS | Status: AC
  Administered 2022-04-06: 1 g via INTRAVENOUS
  Filled 2022-04-06: qty 10

## 2022-04-06 MED ORDER — PANTOPRAZOLE SODIUM 40 MG PO TBEC
80.0000 mg | DELAYED_RELEASE_TABLET | Freq: Every day | ORAL | Status: DC
  Administered 2022-04-07: 80 mg via ORAL
  Filled 2022-04-06: qty 2

## 2022-04-06 NOTE — Assessment & Plan Note (Signed)
Hold home BP meds and diuretics in setting of sepsis.

## 2022-04-06 NOTE — ED Triage Notes (Signed)
Pt wasn't "feeling well last night" and when she woke up she was noticed to be "tired, weak, and confused" per her husband. EMS found her to have malodorous urine but family doesn't mention issues with painful urination. Pt's reported baseline with no diagnoses of dementia but husband states that pt does have "memory issues".

## 2022-04-06 NOTE — Assessment & Plan Note (Signed)
Continue eliquis  ?

## 2022-04-06 NOTE — H&P (Signed)
History and Physical    Patient: [name] KGU:[id number] DOB: 1932/03/15 DOA: 04/06/2022 DOS: the patient was seen and examined on 04/06/2022 PCP: Burnard Bunting, MD  Patient coming from: Home  Chief Complaint:  Chief Complaint  Patient presents with   Weakness   HPI: [name] is a 88 y.o. female with medical history significant of PE on eliquis, CHF, HTN.  Pt presenting to ED with onset of confusion and lethargy at home today.  Was fine as of evening on 9/12 per husband who is at bedside.  Today seemed tired and weak and confused.  No dysuria, no pain, no rash, no skin ulcer.  Has nausea, but no vomiting.  Has cough but no SOB.  Has occasional episodes of choking chronically.  Has chronic nerve pain / sensitivity in leg from prior fracture.  No new knee swelling, pain, skin rash.    Review of Systems: As mentioned in the history of present illness. All other systems reviewed and are negative. Past Medical History:  Diagnosis Date   Arthritis    "in my back"   Breast cancer ([place]) 1996   left breast cancer    Complication of anesthesia 04/18/2012   "didn't tolerate it today very well; had the shakes and very hard time w/it"   Depression 11/20/2012   DVT (deep venous thrombosis) (HCC)    High cholesterol    History of alcoholism ([place])    7 1/2 years clean   Hypertension    Melanoma ([place]) 01/15/2004   upper right arm   Osteoporosis    Peripheral vascular disease (HCC)    hx of ligation    Personal history of radiation therapy 1993   Left Breast Cancer   SCC (squamous cell carcinoma) 01/15/2004   left post upper arm, Right elbow, post left knee   SCC (squamous cell carcinoma) 05/21/2013   right forearm, right jawline, left cheek   SCC (squamous cell carcinoma) 08/24/2016   right shin,right thigh   SCC (squamous cell carcinoma) 11/01/2016   SCC (squamous cell carcinoma) 02/02/2018   left outer arm,right outer thigh   SCC (squamous cell carcinoma)  12/13/2018   right jawline   Spinal stenosis    Past Surgical History:  Procedure Laterality Date   BREAST LUMPECTOMY Left 1996   HARDWARE REMOVAL Left 09/03/2020   Procedure: HARDWARE REMOVAL;  Surgeon: Altamese Rosemead, MD;  Location: [facility];  Service: Orthopedics;  Laterality: Left;   I & D EXTREMITY  04/17/2012   Procedure: IRRIGATION AND DEBRIDEMENT EXTREMITY;  Surgeon: Rudean Haskell, MD;  Location: [place];  Service: Orthopedics;  Laterality: Right;   INTRAMEDULLARY (IM) NAIL INTERTROCHANTERIC Left 09/03/2020   Procedure: INTRAMEDULLARY (IM) NAIL INTERTROCHANTRIC;  Surgeon: Altamese Chaffee, MD;  Location: [facility];  Service: Orthopedics;  Laterality: Left;   KNEE ARTHROSCOPY  04/17/2012   Procedure: ARTHROSCOPY KNEE;  Surgeon: Rudean Haskell, MD;  Location: [facility];  Service: Orthopedics;  Laterality: Right;   KYPHOPLASTY  2009, 2013   thorasic, lumbar   MENISECTOMY Right 04/11/2012   ORIF FEMUR FRACTURE Left 11/01/2019   Procedure: OPEN REDUCTION INTERNAL FIXATION (ORIF) DISTAL FEMUR FRACTURE;  Surgeon: Altamese [place], MD;  Location: [facility];  Service: Orthopedics;  Laterality: Left;   ORIF PERIPROSTHETIC FRACTURE Right 05/03/2018   Procedure: RIGHT RETROGRADE FEMORAL NAIL;  Surgeon: Rod Can, MD;  Location: [facility];  Service: Orthopedics;  Laterality: Right;   POSTERIOR LAMINECTOMY / DECOMPRESSION LUMBAR SPINE  1980's   rotator cuff surgery  Right    TONSILLECTOMY AND ADENOIDECTOMY     "I was a child"   TOTAL KNEE ARTHROPLASTY Right 10/29/2012   Procedure: RIGHT TOTAL KNEE ARTHROPLASTY;  Surgeon: Gearlean Alf, MD;  Location: [facility];  Service: Orthopedics;  Laterality: Right;   VEIN LIGATION     Social History:  reports that she has never smoked. She has never used smokeless tobacco. She reports that she does not drink alcohol and does not use drugs.  Allergies  Allergen Reactions   Oysters [Shellfish Allergy] Nausea And Vomiting   Codeine Nausea And Vomiting   Penicillins Rash     Family History  Problem Relation Age of Onset   Kidney failure Mother    Anuerysm Father        AAA    Prior to Admission medications   Medication Sig Start Date End Date Taking? Authorizing Provider  acetaminophen (TYLENOL) 325 MG tablet Take 2 tablets (650 mg total) by mouth every 6 (six) hours as needed for mild pain or moderate pain. 09/09/20  Yes Barb Merino, MD  alendronate (FOSAMAX) 70 MG tablet Take 1 tablet (70 mg total) by mouth once a week. Take with a full glass of water on an empty stomach. On sundays Patient taking differently: Take 70 mg by mouth once a week. 10/12/20  Yes Fargo, Amy E, NP  apixaban (ELIQUIS) 5 MG TABS tablet Take 1 tablet (5 mg total) by mouth 2 (two) times daily. 10/12/20  Yes Fargo, Amy E, NP  bisacodyl (DULCOLAX) 10 MG suppository Place 10 mg rectally daily as needed for moderate constipation.   Yes [provider]  Calcium Carbonate-Vitamin D (CALCIUM-D) 600-400 MG-UNIT TABS Take 1 tablet by mouth daily.   Yes [provider]  Ensure (ENSURE) Take 237 mLs by mouth daily in the afternoon.   Yes [provider]  escitalopram (LEXAPRO) 20 MG tablet Take 1 tablet (20 mg total) by mouth daily. 10/12/20  Yes Fargo, Amy E, NP  furosemide (LASIX) 40 MG tablet TAKE 1 TABLET (40 MG TOTAL) BY MOUTH 3 (THREE) TIMES A WEEK. Patient taking differently: Take 40 mg by mouth 3 (three) times a week. Take one tablet by mouth on Monday, Wednesday, Fridays 04/14/21  Yes Croitoru, Mihai, MD  gabapentin (NEURONTIN) 300 MG capsule Take 300 mg by mouth 4 (four) times daily. 03/10/22  Yes [provider]  losartan (COZAAR) 25 MG tablet Take 0.5 tablets (12.5 mg total) by mouth daily. 10/12/20  Yes Fargo, Amy E, NP  Multiple Vitamins-Minerals (MULTIVITAMIN ADULT) TABS Take 1 tablet by mouth daily.  08/17/09  Yes [provider]  omeprazole (PRILOSEC) 40 MG capsule Take 1 capsule (40 mg total) by mouth daily. 10/12/20  Yes Fargo, Amy E, NP   rosuvastatin (CRESTOR) 20 MG tablet Take 1 tablet (20 mg total) by mouth daily at 6 PM. Patient taking differently: Take 20 mg by mouth daily. 10/12/20  Yes Fargo, Amy E, NP  senna-docusate (SENOKOT-S) 8.6-50 MG tablet Take 1 tablet by mouth at bedtime. Patient taking differently: Take 1 tablet by mouth at bedtime as needed for mild constipation. 10/12/20  Yes Fargo, Amy E, NP  tamsulosin (FLOMAX) 0.4 MG CAPS capsule Take 1 capsule (0.4 mg total) by mouth daily. 10/12/20  Yes Fargo, Amy E, NP  lactulose (CHRONULAC) 10 GM/15ML solution Take 30 mLs (20 g total) by mouth 2 (two) times daily as needed for mild constipation or moderate constipation. Patient not taking: Reported on 04/06/2022 10/12/20   Cleophas Dunker,  Amy E, NP    Physical Exam: Vitals:   04/06/22 1830 04/06/22 1900 04/06/22 2000 04/06/22 2100  BP: (!) 114/52 111/61 (!) 106/58 (!) 105/54  Pulse: 66 66 66 63  Resp: (!) 26 (!) 21 (!) 31 (!) 29  Temp:      TempSrc:      SpO2: 93% 93% 94% 92%   Constitutional: NAD, calm, comfortable Eyes: PERRL, lids and conjunctivae normal ENMT: Mucous membranes are dry. Posterior pharynx clear of any exudate or lesions.Normal dentition.  Neck: normal, supple, no masses, no thyromegaly Respiratory: Tachypnea, rales. Cardiovascular: Regularly irregular. Abdomen: no tenderness, no masses palpated. No hepatosplenomegaly. Bowel sounds positive.  Musculoskeletal: no clubbing / cyanosis. No joint deformity upper and lower extremities. Good ROM, no contractures. Normal muscle tone.  Skin: no rashes, lesions, ulcers. No induration Neurologic: CN 2-12 grossly intact. Sensation intact, DTR normal. Strength 5/5 in all 4.  Increased sensitivity to light touch in foot. Psychiatric: Confusion, difficulty answering questions.  Data Reviewed:    CBC    Component Value Date/Time   WBC 12.4 (H) 04/06/2022 1629   RBC 4.10 04/06/2022 1629   HGB 12.5 04/06/2022 1629   HCT 38.3 04/06/2022 1629   PLT 168 04/06/2022  1629   MCV 93.4 04/06/2022 1629   MCH 30.5 04/06/2022 1629   MCHC 32.6 04/06/2022 1629   RDW 14.0 04/06/2022 1629   LYMPHSABS 2.1 04/06/2022 1629   MONOABS 1.3 (H) 04/06/2022 1629   EOSABS 0.0 04/06/2022 1629   BASOSABS 0.1 04/06/2022 1629   CMP     Component Value Date/Time   NA 138 04/06/2022 1629   NA 138 09/30/2020 0000   K 3.8 04/06/2022 1629   CL 105 04/06/2022 1629   CO2 25 04/06/2022 1629   GLUCOSE 106 (H) 04/06/2022 1629   BUN 19 04/06/2022 1629   BUN 31 (A) 09/15/2020 0000   CREATININE 0.65 04/06/2022 1629   CREATININE 0.75 06/13/2012 1057   CALCIUM 9.0 04/06/2022 1629   PROT 6.6 04/06/2022 1629   ALBUMIN 3.5 04/06/2022 1629   AST 31 04/06/2022 1629   ALT 24 04/06/2022 1629   ALKPHOS 63 04/06/2022 1629   BILITOT 1.5 (H) 04/06/2022 1629   GFRNONAA >60 04/06/2022 1629   GFRNONAA 88 05/23/2012 1337   GFRAA >90 09/30/2020 0000   GFRAA >89 05/23/2012 1337   CXR = no acute findings for PNA  COVID neg    Assessment and Plan: * Sepsis ([place]) CXR neg but presumed to be secondary to CAP given: 1) crackles 2) h/o occasional aspiration events 3) tachypnea 4) borderline O2 sat today (92% on RA) 5) lack of other obvious source  Plan: PNA pathway Rocephin + doxycycline IVF Tele monitor Tylenol PRN fever NPO except sips with meds for the moment SLP eval in AM.  AV block, Mobitz 1 EKG today looks like a Mobitz 1.  New diagnosis. Tele monitor. Probably needs follow up with cards at a minimum.  Acute encephalopathy Delirium secondary to sepsis + fever as above.  History of pulmonary embolism Continue eliquis.  Chronic systolic CHF (congestive heart failure) (HCC) Hold diuretics in setting of sepsis.  Essential hypertension, benign Hold home BP meds and diuretics in setting of sepsis.      Advance Care Planning:   Code Status: Full Code   Consults: None  Family Communication: Husband at bedside  Severity of Illness: The appropriate patient  status for this patient is OBSERVATION. Observation status is judged to be reasonable and necessary in  order to provide the required intensity of service to ensure the patient's safety. The patient's presenting symptoms, physical exam findings, and initial radiographic and laboratory data in the context of their medical condition is felt to place them at decreased risk for further clinical deterioration. Furthermore, it is anticipated that the patient will be medically stable for discharge from the hospital within 2 midnights of admission.   Author: Etta Quill., DO 04/06/2022 9:18 PM  For on call review [email or web address].

## 2022-04-06 NOTE — Sepsis Progress Note (Signed)
Elink following Code Sepsis. 

## 2022-04-06 NOTE — Progress Notes (Signed)
Pt seen and assessed.  Pt is awake and pleasantly confused.  No respiratory distress noted.  BBS diminished.  Pt stated she has not coughed any mucus up.  This writer attempted to initiate flutter therapy but pt refused and stated, "let's skip it."  RN aware.

## 2022-04-06 NOTE — Assessment & Plan Note (Signed)
Hold diuretics in setting of sepsis.

## 2022-04-06 NOTE — ED Provider Notes (Signed)
[facility] DEPT Provider Note   CSN: [id number] Arrival date & time: 04/06/22  1525     History  Chief Complaint  Patient presents with   Weakness    [name] is a 88 y.o. female.  Patient is an 88 year old female with a history of hypertension, hyperlipidemia, PVD, DVT, depression and prior history of alcoholism who is presenting today due to not feeling herself.  EMS and patient's husband gives most of the story and reports that last night she started not feeling well and when she woke up this morning she seemed tired weak and confused per her husband.  EMS found that her urine smelled malodorous but family did not mention that the patient complained of any painful urination.  Patient reported this morning she felt nauseated but denied any vomiting.  When asked if she has had a cough she says yes but denied any shortness of breath.  She denies any abdominal pain or diarrhea.  She cannot give any further history.  The history is provided by the patient and medical records. The history is limited by the absence of a caregiver.  Weakness      Home Medications Prior to Admission medications   Medication Sig Start Date End Date Taking? Authorizing Provider  acetaminophen (TYLENOL) 325 MG tablet Take 2 tablets (650 mg total) by mouth every 6 (six) hours as needed for mild pain or moderate pain. 09/09/20   Barb Merino, MD  alendronate (FOSAMAX) 70 MG tablet Take 1 tablet (70 mg total) by mouth once a week. Take with a full glass of water on an empty stomach. On sundays 10/12/20   Yvonna Alanis, NP  apixaban (ELIQUIS) 5 MG TABS tablet Take 1 tablet (5 mg total) by mouth 2 (two) times daily. 10/12/20   Fargo, Amy E, NP  bisacodyl (DULCOLAX) 10 MG suppository Place 10 mg rectally as needed for moderate constipation.    [provider]  Calcium Carbonate-Vitamin D (CALCIUM-D) 600-400 MG-UNIT TABS Take 1 tablet by mouth daily.    [provider]  Ensure (ENSURE) Take 237 mLs by mouth daily in the afternoon.    [provider]  escitalopram (LEXAPRO) 20 MG tablet Take 1 tablet (20 mg total) by mouth daily. 10/12/20   Fargo, Amy E, NP  furosemide (LASIX) 40 MG tablet TAKE 1 TABLET (40 MG TOTAL) BY MOUTH 3 (THREE) TIMES A WEEK. 04/14/21   Croitoru, Mihai, MD  lactulose (CHRONULAC) 10 GM/15ML solution Take 30 mLs (20 g total) by mouth 2 (two) times daily as needed for mild constipation or moderate constipation. 10/12/20   Fargo, Amy E, NP  losartan (COZAAR) 25 MG tablet Take 0.5 tablets (12.5 mg total) by mouth daily. 10/12/20   Fargo, Amy E, NP  Multiple Vitamins-Minerals (MULTIVITAMIN ADULT) TABS Take 1 tablet by mouth daily.  08/17/09   [provider]  omeprazole (PRILOSEC) 40 MG capsule Take 1 capsule (40 mg total) by mouth daily. 10/12/20   Fargo, Amy E, NP  rosuvastatin (CRESTOR) 20 MG tablet Take 1 tablet (20 mg total) by mouth daily at 6 PM. 10/12/20   Fargo, Amy E, NP  senna-docusate (SENOKOT-S) 8.6-50 MG tablet Take 1 tablet by mouth at bedtime. 10/12/20   Fargo, Amy E, NP  Sodium Phosphates (RA SALINE ENEMA RE) Place rectally as needed.    [provider]  tamsulosin (FLOMAX) 0.4 MG CAPS capsule Take 1 capsule (0.4 mg total) by mouth daily. 10/12/20   Cleophas Dunker,  Amy E, NP      Allergies    Oysters [shellfish allergy], Codeine, and Penicillins    Review of Systems   Review of Systems  Neurological:  Positive for weakness.    Physical Exam Updated Vital Signs BP (!) 114/52   Pulse 66   Temp (!) 100.5 F (38.1 C) (Rectal)   Resp (!) 26   SpO2 93%  Physical Exam Vitals and nursing note reviewed.  Constitutional:      General: She is not in acute distress.    Appearance: She is well-developed.  HENT:     Head: Normocephalic and atraumatic.     Mouth/Throat:     Mouth: Mucous membranes are dry.  Eyes:     Pupils: Pupils are equal, round, and reactive to light.  Cardiovascular:     Rate and Rhythm:  Normal rate and regular rhythm.     Heart sounds: Normal heart sounds. No murmur heard.    No friction rub.  Pulmonary:     Effort: Pulmonary effort is normal. Tachypnea present.     Breath sounds: Examination of the right-lower field reveals rales. Examination of the left-lower field reveals rales. Rales present. No wheezing.  Abdominal:     General: Bowel sounds are normal. There is no distension.     Palpations: Abdomen is soft.     Tenderness: There is no abdominal tenderness. There is no guarding or rebound.  Musculoskeletal:        General: No tenderness. Normal range of motion.     Right lower leg: No edema.     Left lower leg: No edema.     Comments: No edema  Skin:    General: Skin is warm and dry.     Findings: No rash.     Comments: Hot to the touch  Neurological:     Mental Status: She is alert and oriented to person, place, and time.     Comments: Confusion and difficulty answering questions  Psychiatric:        Behavior: Behavior normal.     ED Results / Procedures / Treatments   Labs (all labs ordered are listed, but only abnormal results are displayed) Labs Reviewed  COMPREHENSIVE METABOLIC PANEL - Abnormal; Notable for the following components:      Result Value   Glucose, Bld 106 (*)    Total Bilirubin 1.5 (*)    All other components within normal limits  CBC WITH DIFFERENTIAL/PLATELET - Abnormal; Notable for the following components:   WBC 12.4 (*)    Neutro Abs 8.9 (*)    Monocytes Absolute 1.3 (*)    All other components within normal limits  PROTIME-INR - Abnormal; Notable for the following components:   Prothrombin Time 17.5 (*)    INR 1.5 (*)    All other components within normal limits  APTT - Abnormal; Notable for the following components:   aPTT 40 (*)    All other components within normal limits  SARS CORONAVIRUS 2 BY RT PCR  CULTURE, BLOOD (ROUTINE X 2)  CULTURE, BLOOD (ROUTINE X 2)  URINE CULTURE  LACTIC ACID, PLASMA  URINALYSIS,  ROUTINE W REFLEX MICROSCOPIC    EKG EKG Interpretation  Date/Time:  Wednesday April 06 2022 16:29:59 EDT Ventricular Rate:  69 PR Interval:  212 QRS Duration: 150 QT Interval:  442 QTC Calculation: 474 R Axis:   119 Text Interpretation: Sinus rhythm new  Multiple ventricular premature complexes Borderline prolonged PR interval RBBB and LPFB Confirmed  by Blanchie Dessert [phone]) on 04/06/2022 5:20:41 PM  Radiology DG Chest Port 1 View  Result Date: 04/06/2022 CLINICAL DATA:  Possible sepsis, weakness, confusion EXAM: PORTABLE CHEST 1 VIEW COMPARISON:  Previous studies including the chest radiograph done on 09/04/2020 FINDINGS: Transverse diameter of heart is increased. There is soft tissue fullness in the retrocardiac region suggesting possible fixed hiatal hernia. There are no signs of alveolar pulmonary edema or focal pulmonary consolidation. There is no pleural effusion or pneumothorax. Surgical clips are seen in left axilla. Deformities are noted in multiple left ribs with no significant interval change suggesting old fractures. Degenerative changes are noted in both shoulders, more so on the right side. IMPRESSION: Cardiomegaly. There are no signs of pulmonary edema or focal pulmonary consolidation. Possible fixed hiatal hernia. Electronically Signed   By: Elmer Picker M.D.   On: 04/06/2022 16:40    Procedures Procedures    Medications Ordered in ED Medications  lactated ringers infusion (has no administration in time range)  cefTRIAXone (ROCEPHIN) 1 g in sodium chloride 0.9 % 100 mL IVPB (has no administration in time range)  doxycycline (VIBRA-TABS) tablet 100 mg (has no administration in time range)  lactated ringers bolus 500 mL (0 mLs Intravenous Stopped 04/06/22 1815)  acetaminophen (TYLENOL) tablet 1,000 mg (1,000 mg Oral Given 04/06/22 1651)    ED Course/ Medical Decision Making/ A&P                           Medical Decision Making Amount and/or Complexity  of Data Reviewed Independent Historian: spouse and EMS External Data Reviewed: notes. Labs: ordered. Decision-making details documented in ED Course. Radiology: ordered and independent interpretation performed. Decision-making details documented in ED Course. ECG/medicine tests: ordered and independent interpretation performed. Decision-making details documented in ED Course.  Risk OTC drugs. Prescription drug management. Decision regarding hospitalization.   Pt with multiple medical problems and comorbidities and presenting today with a complaint that caries a high risk for morbidity and mortality.  Who is presenting to today with weakness, confusion and change in baseline.  Patient complains of vague diffuse symptoms.  Concern for developing sepsis from either viral or bacterial infection.  Patient's hemodynamically stable but is mildly tachypneic and has some minimal rales bilaterally in the bases.  Patient is hot to the touch and rectal temperature is 100.5.  Concern for pneumonia, UTI, COVID.  Lower suspicion for meningitis or encephalitis.  No evidence of cellulitis.  Patient has no focal abdominal pain on exam.  Patient given IV fluids, Tylenol and sepsis order set initiated.  7:33 PM I independently interpreted patient's EKG and labs.  EKG shows frequent PVCs but no other acute findings.  CBC with leukocytosis of 12.4, normal lactate, CMP and UA.  COVID is negative. I have independently visualized and interpreted pt's images today.  Chest x-ray today with no definitive infiltrate however patient did have some rales and crackles in the lower lobe as well as a persistent tachypnea here and O2 sats between 91 and 94% on room air.  Speaking with patient's daughter who is now present in the room she does occasionally choke on food concern for possible aspiration pneumonia.  She also reports the patient was so weak today she was unable to get out of bed on her own and slid down to the floor and her  husband had to call someone to come help him get her off the floor.  She usually is able to live  at home independently with him.  Given these factors feel that she should be admitted and receive antibiotics.  She was given Rocephin and doxycycline.  Azithromycin was avoided due to prolonged QT.  Findings discussed with the patient and her daughter and they are comfortable with this plan.  We will consult the hospitalist for admission.          Final Clinical Impression(s) / ED Diagnoses Final diagnoses:  Sepsis without acute organ dysfunction, due to unspecified organism [facility])  Weakness  Delirium    Rx / DC Orders ED Discharge Orders     None         Blanchie Dessert, MD 04/06/22 1933

## 2022-04-06 NOTE — Assessment & Plan Note (Addendum)
Delirium secondary to sepsis + fever as above.

## 2022-04-06 NOTE — Assessment & Plan Note (Signed)
EKG today looks like a Mobitz 1.  New diagnosis. 1. Tele monitor. 2. Probably needs follow up with cards at a minimum.

## 2022-04-06 NOTE — Assessment & Plan Note (Signed)
CXR neg but presumed to be secondary to CAP given: 1) crackles 2) h/o occasional aspiration events 3) tachypnea 4) borderline O2 sat today (92% on RA) 5) lack of other obvious source  Plan: 1. PNA pathway 2. Rocephin + doxycycline 3. IVF 4. Tele monitor 5. Tylenol PRN fever 6. NPO except sips with meds for the moment 1. SLP eval in AM.

## 2022-04-07 ENCOUNTER — Encounter (HOSPITAL_COMMUNITY): Payer: Self-pay | Admitting: Internal Medicine

## 2022-04-07 ENCOUNTER — Other Ambulatory Visit: Payer: Self-pay

## 2022-04-07 ENCOUNTER — Observation Stay (HOSPITAL_COMMUNITY): Payer: Medicare Other

## 2022-04-07 DIAGNOSIS — Z86711 Personal history of pulmonary embolism: Secondary | ICD-10-CM | POA: Diagnosis not present

## 2022-04-07 DIAGNOSIS — I951 Orthostatic hypotension: Secondary | ICD-10-CM | POA: Diagnosis present

## 2022-04-07 DIAGNOSIS — Z01818 Encounter for other preprocedural examination: Secondary | ICD-10-CM | POA: Diagnosis not present

## 2022-04-07 DIAGNOSIS — F05 Delirium due to known physiological condition: Secondary | ICD-10-CM | POA: Diagnosis present

## 2022-04-07 DIAGNOSIS — Z7189 Other specified counseling: Secondary | ICD-10-CM | POA: Diagnosis not present

## 2022-04-07 DIAGNOSIS — G249 Dystonia, unspecified: Secondary | ICD-10-CM | POA: Diagnosis present

## 2022-04-07 DIAGNOSIS — E7800 Pure hypercholesterolemia, unspecified: Secondary | ICD-10-CM | POA: Diagnosis present

## 2022-04-07 DIAGNOSIS — D638 Anemia in other chronic diseases classified elsewhere: Secondary | ICD-10-CM | POA: Diagnosis present

## 2022-04-07 DIAGNOSIS — I5022 Chronic systolic (congestive) heart failure: Secondary | ICD-10-CM | POA: Diagnosis not present

## 2022-04-07 DIAGNOSIS — Z0189 Encounter for other specified special examinations: Secondary | ICD-10-CM | POA: Diagnosis not present

## 2022-04-07 DIAGNOSIS — Z681 Body mass index (BMI) 19 or less, adult: Secondary | ICD-10-CM | POA: Diagnosis not present

## 2022-04-07 DIAGNOSIS — Z923 Personal history of irradiation: Secondary | ICD-10-CM | POA: Diagnosis not present

## 2022-04-07 DIAGNOSIS — I10 Essential (primary) hypertension: Secondary | ICD-10-CM | POA: Diagnosis not present

## 2022-04-07 DIAGNOSIS — A419 Sepsis, unspecified organism: Secondary | ICD-10-CM | POA: Diagnosis present

## 2022-04-07 DIAGNOSIS — F1021 Alcohol dependence, in remission: Secondary | ICD-10-CM | POA: Diagnosis present

## 2022-04-07 DIAGNOSIS — I110 Hypertensive heart disease with heart failure: Secondary | ICD-10-CM | POA: Diagnosis present

## 2022-04-07 DIAGNOSIS — Z20822 Contact with and (suspected) exposure to covid-19: Secondary | ICD-10-CM | POA: Diagnosis present

## 2022-04-07 DIAGNOSIS — Z515 Encounter for palliative care: Secondary | ICD-10-CM | POA: Diagnosis not present

## 2022-04-07 DIAGNOSIS — E440 Moderate protein-calorie malnutrition: Secondary | ICD-10-CM | POA: Diagnosis present

## 2022-04-07 DIAGNOSIS — I5042 Chronic combined systolic (congestive) and diastolic (congestive) heart failure: Secondary | ICD-10-CM | POA: Diagnosis present

## 2022-04-07 DIAGNOSIS — Z66 Do not resuscitate: Secondary | ICD-10-CM | POA: Diagnosis present

## 2022-04-07 DIAGNOSIS — K5712 Diverticulitis of small intestine without perforation or abscess without bleeding: Secondary | ICD-10-CM | POA: Diagnosis present

## 2022-04-07 DIAGNOSIS — R6520 Severe sepsis without septic shock: Secondary | ICD-10-CM | POA: Diagnosis present

## 2022-04-07 DIAGNOSIS — G9341 Metabolic encephalopathy: Secondary | ICD-10-CM | POA: Diagnosis present

## 2022-04-07 DIAGNOSIS — F0390 Unspecified dementia without behavioral disturbance: Secondary | ICD-10-CM | POA: Diagnosis present

## 2022-04-07 DIAGNOSIS — J189 Pneumonia, unspecified organism: Secondary | ICD-10-CM | POA: Diagnosis present

## 2022-04-07 DIAGNOSIS — Z85828 Personal history of other malignant neoplasm of skin: Secondary | ICD-10-CM | POA: Diagnosis not present

## 2022-04-07 DIAGNOSIS — I739 Peripheral vascular disease, unspecified: Secondary | ICD-10-CM | POA: Diagnosis present

## 2022-04-07 DIAGNOSIS — G9340 Encephalopathy, unspecified: Secondary | ICD-10-CM | POA: Diagnosis not present

## 2022-04-07 LAB — COMPREHENSIVE METABOLIC PANEL
ALT: 20 U/L (ref 0–44)
AST: 23 U/L (ref 15–41)
Albumin: 3.2 g/dL — ABNORMAL LOW (ref 3.5–5.0)
Alkaline Phosphatase: 66 U/L (ref 38–126)
Anion gap: 8 (ref 5–15)
BUN: 18 mg/dL (ref 8–23)
CO2: 25 mmol/L (ref 22–32)
Calcium: 9 mg/dL (ref 8.9–10.3)
Chloride: 103 mmol/L (ref 98–111)
Creatinine, Ser: 0.66 mg/dL (ref 0.44–1.00)
GFR, Estimated: >60 mL/min (ref >60)
Glucose, Bld: 96 mg/dL (ref 70–99)
Potassium: 3.7 mmol/L (ref 3.5–5.1)
Sodium: 136 mmol/L (ref 135–145)
Total Bilirubin: 1.6 mg/dL — ABNORMAL HIGH (ref 0.3–1.2)
Total Protein: 6.4 g/dL — ABNORMAL LOW (ref 6.5–8.1)

## 2022-04-07 LAB — CBC
HCT: 38.1 % (ref 36.0–46.0)
Hemoglobin: 12.7 g/dL (ref 12.0–15.0)
MCH: 30.9 pg (ref 26.0–34.0)
MCHC: 33.3 g/dL (ref 30.0–36.0)
MCV: 92.7 fL (ref 80.0–100.0)
Platelets: 147 10*3/uL — ABNORMAL LOW (ref 150–400)
RBC: 4.11 MIL/uL (ref 3.87–5.11)
RDW: 13.9 % (ref 11.5–15.5)
WBC: 11.3 10*3/uL — ABNORMAL HIGH (ref 4.0–10.5)
nRBC: 0 % (ref 0.0–0.2)

## 2022-04-07 LAB — RESPIRATORY PANEL BY PCR

## 2022-04-07 LAB — STREP PNEUMONIAE URINARY ANTIGEN: Strep Pneumo Urinary Antigen: NEGATIVE

## 2022-04-07 LAB — URINE CULTURE: Culture: NO GROWTH

## 2022-04-07 LAB — MAGNESIUM: Magnesium: 1.6 mg/dL — ABNORMAL LOW (ref 1.7–2.4)

## 2022-04-07 LAB — HIV ANTIBODY (ROUTINE TESTING W REFLEX): HIV Screen 4th Generation wRfx: NONREACTIVE

## 2022-04-07 MED ORDER — MAGNESIUM SULFATE 2 GM/50ML IV SOLN
2.0000 g | Freq: Once | INTRAVENOUS | Status: AC
  Administered 2022-04-07: 2 g via INTRAVENOUS
  Filled 2022-04-07: qty 50

## 2022-04-07 MED ORDER — ACETAMINOPHEN 500 MG PO TABS
1000.0000 mg | ORAL_TABLET | Freq: Once | ORAL | Status: AC
  Administered 2022-04-07: 1000 mg via ORAL
  Filled 2022-04-07: qty 2

## 2022-04-07 MED ORDER — IOHEXOL 12 MG/ML PO SOLN: 500.0000 mL | ORAL | Status: AC

## 2022-04-07 MED ORDER — PANTOPRAZOLE SODIUM 40 MG IV SOLR
40.0000 mg | INTRAVENOUS | Status: DC
  Administered 2022-04-07 – 2022-04-12 (×6): 40 mg via INTRAVENOUS
  Filled 2022-04-07 (×6): qty 10

## 2022-04-07 MED ORDER — HEPARIN BOLUS VIA INFUSION
3000.0000 [IU] | Freq: Once | INTRAVENOUS | Status: DC
  Filled 2022-04-07: qty 3000

## 2022-04-07 MED ORDER — CHLORHEXIDINE GLUCONATE CLOTH 2 % EX PADS
6.0000 | MEDICATED_PAD | Freq: Every day | CUTANEOUS | Status: DC
  Administered 2022-04-07 – 2022-04-12 (×6): 6 via TOPICAL

## 2022-04-07 MED ORDER — HEPARIN (PORCINE) 25000 UT/250ML-% IV SOLN
850.0000 [IU]/h | INTRAVENOUS | Status: DC
  Administered 2022-04-07: 850 [IU]/h via INTRAVENOUS
  Filled 2022-04-07 (×2): qty 250

## 2022-04-07 MED ORDER — SODIUM CHLORIDE (PF) 0.9 % IJ SOLN
INTRAMUSCULAR | Status: AC
  Administered 2022-04-07: 3 mL via INTRAVENOUS
  Filled 2022-04-07: qty 50

## 2022-04-07 MED ORDER — IOHEXOL 300 MG/ML  SOLN
100.0000 mL | Freq: Once | INTRAMUSCULAR | Status: AC | PRN
  Administered 2022-04-07: 100 mL via INTRAVENOUS

## 2022-04-07 MED ORDER — LIDOCAINE HCL URETHRAL/MUCOSAL 2 % EX GEL
1.0000 | Freq: Once | CUTANEOUS | Status: AC
  Administered 2022-04-07: 1 via URETHRAL
  Filled 2022-04-07: qty 11

## 2022-04-07 MED ORDER — IOHEXOL 9 MG/ML PO SOLN
ORAL | Status: AC
  Administered 2022-04-07: 500 mL
  Filled 2022-04-07: qty 1000

## 2022-04-07 MED ORDER — MORPHINE SULFATE (PF) 2 MG/ML IV SOLN
2.0000 mg | INTRAVENOUS | Status: DC | PRN
  Administered 2022-04-08: 2 mg via INTRAVENOUS
  Filled 2022-04-07: qty 1

## 2022-04-07 MED ORDER — METRONIDAZOLE 500 MG/100ML IV SOLN
500.0000 mg | Freq: Two times a day (BID) | INTRAVENOUS | Status: DC
  Administered 2022-04-07 – 2022-04-11 (×10): 500 mg via INTRAVENOUS
  Filled 2022-04-07 (×10): qty 100

## 2022-04-07 MED ORDER — IOHEXOL 12 MG/ML PO SOLN: 500.0000 mL | ORAL | Status: DC

## 2022-04-07 NOTE — Consult Note (Signed)
[name] 1931/08/08  [id number].    Requesting MD: Dr. Hosie Poisson Chief Complaint/Reason for Consult: mesenteric abscess  HPI:  This is an 88 yo female with history of HTN, CHF, PE on Eliquis (last dose today at 78) who presented to the ED with confusion and lethargy at home.  The patient is not able to provide much significant history due to her confusion.  Husband is at bedside and helps provide history.  Husband states she was normal on Tuesday night. When she awoke the next morning she was tired, didn't not want to get out of bed and was confused. She has developed some nausea, but no vomiting.  She has also had some dysphagia recently with "choking" episodes while eating. Reports her confusion has improved today but she is still only orientated to self and place on my questioning. The patient last had a colonoscopy in 2015 due to heme-positive stools and was noted to have severe diverticulosis with some mild melanosis.  No recent blood noted in her stools.  Upon arrival to the ED she underwent a work up that revealed a WBC of 12.4 and otherwise unremarkable labs.  She underwent a CT scan due to some LLQ abdominal pain.  This revealed some thickened small bowel loops in the left abdomen with a 3cm collection of gas and debris in the mesentery thought to possibly be from a small bowel diverticulum or possible SB perforation.  There is a second bilobed large collection of gas in the inferior left small bowel mesentery.  She also has a distended bladder and a large hiatal hernia with 75-100% of her stomach in her chest. We have been asked to see for further evaluation and recommendations.  No prior abdominal surgeries.  She was at home with her husband.  Normally walks with a walker and can ambulate around her house without fatigue/shortness of breath.   ROS: ROS: see HPI  Family History  Problem Relation Age of Onset   Kidney failure Mother    Anuerysm Father        AAA    Past  Medical History:  Diagnosis Date   Arthritis    "in my back"   Breast cancer ([place]) 1996   left breast cancer    Complication of anesthesia 04/18/2012   "didn't tolerate it today very well; had the shakes and very hard time w/it"   Depression 11/20/2012   DVT (deep venous thrombosis) (HCC)    High cholesterol    History of alcoholism ([place])    7 1/2 years clean   Hypertension    Melanoma ([place]) 01/15/2004   upper right arm   Osteoporosis    Peripheral vascular disease (HCC)    hx of ligation    Personal history of radiation therapy 1993   Left Breast Cancer   SCC (squamous cell carcinoma) 01/15/2004   left post upper arm, Right elbow, post left knee   SCC (squamous cell carcinoma) 05/21/2013   right forearm, right jawline, left cheek   SCC (squamous cell carcinoma) 08/24/2016   right shin,right thigh   SCC (squamous cell carcinoma) 11/01/2016   SCC (squamous cell carcinoma) 02/02/2018   left outer arm,right outer thigh   SCC (squamous cell carcinoma) 12/13/2018   right jawline   Spinal stenosis     Past Surgical History:  Procedure Laterality Date   BREAST LUMPECTOMY Left 1996   HARDWARE REMOVAL Left 09/03/2020   Procedure: HARDWARE REMOVAL;  Surgeon: Altamese Ashley, MD;  Location: [place];  Service: Orthopedics;  Laterality: Left;   I & D EXTREMITY  04/17/2012   Procedure: IRRIGATION AND DEBRIDEMENT EXTREMITY;  Surgeon: Rudean Haskell, MD;  Location: [place];  Service: Orthopedics;  Laterality: Right;   INTRAMEDULLARY (IM) NAIL INTERTROCHANTERIC Left 09/03/2020   Procedure: INTRAMEDULLARY (IM) NAIL INTERTROCHANTRIC;  Surgeon: Altamese Creston, MD;  Location: [place];  Service: Orthopedics;  Laterality: Left;   KNEE ARTHROSCOPY  04/17/2012   Procedure: ARTHROSCOPY KNEE;  Surgeon: Rudean Haskell, MD;  Location: [facility];  Service: Orthopedics;  Laterality: Right;   KYPHOPLASTY  2009, 2013   thorasic, lumbar   MENISECTOMY Right 04/11/2012   ORIF FEMUR FRACTURE Left 11/01/2019   Procedure:  OPEN REDUCTION INTERNAL FIXATION (ORIF) DISTAL FEMUR FRACTURE;  Surgeon: Altamese Republic, MD;  Location: [facility];  Service: Orthopedics;  Laterality: Left;   ORIF PERIPROSTHETIC FRACTURE Right 05/03/2018   Procedure: RIGHT RETROGRADE FEMORAL NAIL;  Surgeon: Rod Can, MD;  Location[facility];  Service: Orthopedics;  Laterality: Right;   POSTERIOR LAMINECTOMY / DECOMPRESSION LUMBAR SPINE  1980's   rotator cuff surgery  Right    TONSILLECTOMY AND ADENOIDECTOMY     "I was a child"   TOTAL KNEE ARTHROPLASTY Right 10/29/2012   Procedure: RIGHT TOTAL KNEE ARTHROPLASTY;  Surgeon: Gearlean Alf, MD;  Location[facility];  Service: Orthopedics;  Laterality: Right;   VEIN LIGATION      Social History:  reports that she has never smoked. She has never used smokeless tobacco. She reports that she does not drink alcohol and does not use drugs.  Allergies:  Allergies  Allergen Reactions   Oysters [Shellfish Allergy] Nausea And Vomiting   Codeine Nausea And Vomiting   Penicillins Rash    (Not in a hospital admission)    Physical Exam: Blood pressure (!) 115/56, pulse 69, temperature 98.7 F (37.1 C), temperature source Oral, resp. rate 19, height '5\' 6"'$  (1.676 m), weight 54 kg, SpO2 94 %. General: pleasant, elderly female who is laying in bed in NAD HEENT: head is normocephalic, atraumatic.  Sclera are noninjected.  PERRL.  Ears and nose without any masses or lesions.  Mouth is pink and moist Heart: regular, rate, and rhythm Lungs: CTAB, no wheezes, rhonchi, or rales noted.  Respiratory effort nonlabored Abd: Lower abdominal distension but soft, left sided abdominal tenderness without rigidity or guarding, +BS. No obvious masses, hernias, or organomegaly MS: No BUE or BLE edema. MAE's Skin: warm and dry with no masses, lesions, or rashes Neuro: Cranial nerves 2-12 grossly intact, able speech, thought process intact, moves all extremities, gait not assessed Psych: A&Ox2 (self and place)  Results  for orders placed or performed during the hospital encounter of 04/06/22 (from the past 48 hour(s))  SARS Coronavirus 2 by RT PCR (hospital order, performed in [facility] hospital lab) *cepheid single result test* Anterior Nasal Swab     Status: None   Collection Time: 04/06/22  4:28 PM   Specimen: Anterior Nasal Swab  Result Value Ref Range   SARS Coronavirus 2 by RT PCR NEGATIVE NEGATIVE    Comment: (NOTE) SARS-CoV-2 target nucleic acids are NOT DETECTED.  The SARS-CoV-2 RNA is generally detectable in upper and lower respiratory specimens during the acute phase of infection. The lowest concentration of SARS-CoV-2 viral copies this assay can detect is 250 copies / mL. A negative result does not preclude SARS-CoV-2 infection and should not be used as the sole basis for treatment or other patient management decisions.  A negative result may occur with improper specimen collection / handling, submission of specimen other than nasopharyngeal swab, presence of viral mutation(s) within the areas targeted by this assay, and inadequate number of viral copies (<250 copies / mL). A negative result must be combined with clinical observations, patient history, and epidemiological information.  Fact Sheet for Patients:   https://www.patel.info/  Fact Sheet for Healthcare Providers: https://hall.com/  This test is not yet approved or  cleared by the [place] FDA and has been authorized for detection and/or diagnosis of SARS-CoV-2 by FDA under an Emergency Use Authorization (EUA).  This EUA will remain in effect (meaning this test can be used) for the duration of the COVID-19 declaration under Section 564(b)(1) of the Act, 21 U.S.C. section 360bbb-3(b)(1), unless the authorization is terminated or revoked sooner.  Performed at [facility], [place] [address] [place], [place] [address]   Lactic acid, plasma     Status: None    Collection Time: 04/06/22  4:29 PM  Result Value Ref Range   Lactic Acid, Venous 0.8 0.5 - 1.9 mmol/L    Comment: Performed at [facility], [place] [address], [place], [place] [address]  Comprehensive metabolic panel     Status: Abnormal   Collection Time: 04/06/22  4:29 PM  Result Value Ref Range   Sodium 138 135 - 145 mmol/L   Potassium 3.8 3.5 - 5.1 mmol/L   [place] 105 98 - 111 mmol/L   CO2 25 22 - 32 mmol/L   Glucose, Bld 106 (H) 70 - 99 mg/dL    Comment: Glucose reference range applies only to samples taken after fasting for at least 8 hours.   BUN 19 8 - 23 mg/dL   Creatinine, Ser 0.65 0.44 - 1.00 mg/dL   Calcium 9.0 8.9 - 10.3 mg/dL   Total Protein 6.6 6.5 - 8.1 g/dL   Albumin 3.5 3.5 - 5.0 g/dL   AST 31 15 - 41 U/L   ALT 24 0 - 44 U/L   Alkaline Phosphatase 63 38 - 126 U/L   Total Bilirubin 1.5 (H) 0.3 - 1.2 mg/dL   GFR, Estimated >60 >60 mL/min    Comment: (NOTE) Calculated using the CKD-EPI Creatinine Equation (2021)    Anion gap 8 5 - 15    Comment: Performed at [facility], [place] [address], [place], [place] [address]  CBC with Differential     Status: Abnormal   Collection Time: 04/06/22  4:29 PM  Result Value Ref Range   WBC 12.4 (H) 4.0 - 10.5 K/uL   RBC 4.10 3.87 - 5.11 MIL/uL   Hemoglobin 12.5 12.0 - 15.0 g/dL   HCT 38.3 36.0 - 46.0 %   MCV 93.4 80.0 - 100.0 fL   MCH 30.5 26.0 - 34.0 pg   MCHC 32.6 30.0 - 36.0 g/dL   RDW 14.0 11.5 - 15.5 %   Platelets 168 150 - 400 K/uL   nRBC 0.0 0.0 - 0.2 %   Neutrophils Relative % 72 %   Neutro Abs 8.9 (H) 1.7 - 7.7 K/uL   Lymphocytes Relative 17 %   Lymphs Abs 2.1 0.7 - 4.0 K/uL   Monocytes Relative 11 %   Monocytes Absolute 1.3 (H) 0.1 - 1.0 K/uL   Eosinophils Relative 0 %   Eosinophils Absolute 0.0 0.0 - 0.5 K/uL   Basophils Relative 0 %   Basophils Absolute 0.1 0.0 - 0.1 K/uL   Immature Granulocytes 0 %  Abs Immature Granulocytes 0.05 0.00 - 0.07 K/uL    Comment:  Performed at [facility], [place] [address] [place], [place] [address]  Protime-INR     Status: Abnormal   Collection Time: 04/06/22  4:29 PM  Result Value Ref Range   Prothrombin Time 17.5 (H) 11.4 - 15.2 seconds   INR 1.5 (H) 0.8 - 1.2    Comment: (NOTE) INR goal varies based on device and disease states. Performed at [facility], [place] [address], [place], [place] [address]   APTT     Status: Abnormal   Collection Time: 04/06/22  4:29 PM  Result Value Ref Range   aPTT 40 (H) 24 - 36 seconds    Comment:        IF BASELINE aPTT IS ELEVATED, SUGGEST PATIENT RISK ASSESSMENT BE USED TO DETERMINE APPROPRIATE ANTICOAGULANT THERAPY. Performed at [facility], [place] [address], [place], [place] [address]   Magnesium     Status: Abnormal   Collection Time: 04/06/22  4:29 PM  Result Value Ref Range   Magnesium 1.6 (L) 1.7 - 2.4 mg/dL    Comment: Performed at [facility], [place] [address], [place], [place] [address]  Blood Culture (routine x 2)     Status: None (Preliminary result)   Collection Time: 04/06/22  4:30 PM   Specimen: BLOOD  Result Value Ref Range   Specimen Description      BLOOD BLOOD RIGHT ARM Performed at [place] [address], [place], [place] [address]    Special Requests      BOTTLES DRAWN AEROBIC AND ANAEROBIC Blood Culture results may not be optimal due to an inadequate volume of blood received in culture bottles Performed at [facility], [place] [address], [place], [place] [address]    Culture      NO GROWTH < 12 HOURS Performed at [place] [address], [place], [place] [address]    Report Status PENDING   Urinalysis, Routine w reflex microscopic Urine, In & Out Cath     Status: None   Collection Time: 04/06/22  6:20 PM  Result Value Ref Range   Color, Urine YELLOW YELLOW   APPearance CLEAR CLEAR   Specific Gravity, Urine 1.015  1.005 - 1.030   pH 7.0 5.0 - 8.0   Glucose, UA NEGATIVE NEGATIVE mg/dL   Hgb urine dipstick NEGATIVE NEGATIVE   Bilirubin Urine NEGATIVE NEGATIVE   Ketones, ur NEGATIVE NEGATIVE mg/dL   Protein, ur NEGATIVE NEGATIVE mg/dL   Nitrite NEGATIVE NEGATIVE   Leukocytes,Ua NEGATIVE NEGATIVE    Comment: Performed at [facility], [place] [address], [place], [place] [address]  HIV Antibody (routine testing w rflx)     Status: None   Collection Time: 04/07/22 12:02 AM  Result Value Ref Range   HIV Screen 4th Generation wRfx Non Reactive Non Reactive    Comment: Performed at [facility], [place] [address] [place], [place] [address]  CBC     Status: Abnormal   Collection Time: 04/07/22  3:54 AM  Result Value Ref Range   WBC 11.3 (H) 4.0 - 10.5 K/uL   RBC 4.11 3.87 - 5.11 MIL/uL   Hemoglobin 12.7 12.0 - 15.0 g/dL   HCT 38.1 36.0 - 46.0 %   MCV 92.7 80.0 - 100.0 fL   MCH 30.9 26.0 - 34.0 pg   MCHC 33.3 30.0 - 36.0 g/dL  RDW 13.9 11.5 - 15.5 %   Platelets 147 (L) 150 - 400 K/uL   nRBC 0.0 0.0 - 0.2 %    Comment: Performed at [facility], [place] [address], [place], [place] [address]  Comprehensive metabolic panel     Status: Abnormal   Collection Time: 04/07/22  5:23 AM  Result Value Ref Range   Sodium 136 135 - 145 mmol/L   Potassium 3.7 3.5 - 5.1 mmol/L   [place] 103 98 - 111 mmol/L   CO2 25 22 - 32 mmol/L   Glucose, Bld 96 70 - 99 mg/dL    Comment: Glucose reference range applies only to samples taken after fasting for at least 8 hours.   BUN 18 8 - 23 mg/dL   Creatinine, Ser 0.66 0.44 - 1.00 mg/dL   Calcium 9.0 8.9 - 10.3 mg/dL   Total Protein 6.4 (L) 6.5 - 8.1 g/dL   Albumin 3.2 (L) 3.5 - 5.0 g/dL   AST 23 15 - 41 U/L   ALT 20 0 - 44 U/L   Alkaline Phosphatase 66 38 - 126 U/L   Total Bilirubin 1.6 (H) 0.3 - 1.2 mg/dL   GFR, Estimated >60 >60 mL/min    Comment: (NOTE) Calculated using the CKD-EPI Creatinine Equation (2021)    Anion gap 8  5 - 15    Comment: Performed at [facility], [place] [address] [place], [place] [address]   CT ABDOMEN PELVIS W CONTRAST  Result Date: 04/07/2022 CLINICAL DATA:  Left lower quadrant pain. EXAM: CT ABDOMEN AND PELVIS WITH CONTRAST TECHNIQUE: Multidetector CT imaging of the abdomen and pelvis was performed using the standard protocol following bolus administration of intravenous contrast. RADIATION DOSE REDUCTION: This exam was performed according to the departmental dose-optimization program which includes automated exposure control, adjustment of the mA and/or kV according to patient size and/or use of iterative reconstruction technique. CONTRAST:  173m OMNIPAQUE IOHEXOL 300 MG/ML  SOLN COMPARISON:  None Available. FINDINGS: Lower chest: Large hiatal hernia with 75-100% of the stomach contained in the chest. Bibasilar collapse/consolidation with small bilateral pleural effusions. Hepatobiliary: No suspicious focal abnormality within the liver parenchyma. There is no evidence for gallstones, gallbladder wall thickening, or pericholecystic fluid. No intrahepatic or extrahepatic biliary dilation. Pancreas: No focal mass lesion. No dilatation of the main duct. No intraparenchymal cyst. No peripancreatic edema. Spleen: No splenomegaly. No focal mass lesion. Adrenals/Urinary Tract: No adrenal nodule or mass. Cortical scarring noted upper pole right kidney. Kidneys otherwise unremarkable. No evidence for hydroureter. Bladder is markedly distended measuring 19.1 x 16.4 x 12.9 cm. Stomach/Bowel: Large hiatal hernia, as above. Duodenum is normally positioned as is the ligament of Treitz. Small bowel loops in the left abdomen show ill-defined wall thickening with perienteric edema/inflammation. Two discrete collections of gas and debris are identified. One of these is visible on axial 36/2 and coronal 60/5 measuring about 3 cm in size. A second bilobed collection of gas is identified more inferiorly  (image 40/2 and coronal 54/5). This collection appears more thin walled and contains only gas without associated fluid debris. Numerous scattered small bowel diverticuli are identified. Terminal ileum not well visualized. The appendix is best seen on coronal images and is unremarkable. No gross colonic mass. No colonic wall thickening. Diverticuli are seen scattered along the entire length of the colon without CT findings of diverticulitis. Vascular/Lymphatic: There is moderate atherosclerotic calcification of the abdominal aorta without aneurysm. There is no gastrohepatic or hepatoduodenal ligament lymphadenopathy. No  retroperitoneal or mesenteric lymphadenopathy. No pelvic sidewall lymphadenopathy. Reproductive: The uterus is unremarkable.  There is no adnexal mass. Other: No intraperitoneal free fluid. Musculoskeletal: Bones are diffusely demineralized. Fixation hardware noted in the left femoral neck and proximal right femur. Multilevel thoracolumbar compression deformity evident with prior vertebral augmentation at L1, L2, and L4. IMPRESSION: 1. Small bowel loops in the left abdomen show ill-defined wall thickening with associated edema/inflammation. 3 cm collection of gas and debris identified in the left abdominal mesentery. Given that there is other diverticular disease in the small bowel, this is probably an enlarged, thick-walled inflamed diverticulum. However, small bowel perforation contained in the adjacent mesentery could have a similar appearance and is not excluded on this study. 2. A second bilobed large collection of gas is identified more inferiorly in the left small bowel mesentery. This collection has no perceptible wall and contains only gas without associated fluid debris. This is felt to represent a patulous small bowel diverticulum although extraluminal gas collection is not excluded. 3. Marked distention of the urinary bladder. Correlation for bladder dysmotility or outlet obstruction  recommended. 4. Large hiatal hernia with 75-100% of the stomach contained in the chest. 5. Bibasilar collapse/consolidation with small bilateral pleural effusions. 6. Diffuse colonic diverticulosis without diverticulitis. 7. Multilevel thoracolumbar compression deformity with prior vertebral augmentation at L1, L2, and L4. 8. Aortic Atherosclerosis (ICD10-I70.0). Electronically Signed   By: Misty Stanley M.D.   On: 04/07/2022 08:07   DG Chest Port 1 View  Result Date: 04/06/2022 CLINICAL DATA:  Possible sepsis, weakness, confusion EXAM: PORTABLE CHEST 1 VIEW COMPARISON:  Previous studies including the chest radiograph done on 09/04/2020 FINDINGS: Transverse diameter of heart is increased. There is soft tissue fullness in the retrocardiac region suggesting possible fixed hiatal hernia. There are no signs of alveolar pulmonary edema or focal pulmonary consolidation. There is no pleural effusion or pneumothorax. Surgical clips are seen in left axilla. Deformities are noted in multiple left ribs with no significant interval change suggesting old fractures. Degenerative changes are noted in both shoulders, more so on the right side. IMPRESSION: Cardiomegaly. There are no signs of pulmonary edema or focal pulmonary consolidation. Possible fixed hiatal hernia. Electronically Signed   By: Elmer Picker M.D.   On: 04/06/2022 16:40    Anti-infectives (From admission, onward)    Start     Dose/Rate Route Frequency Ordered Stop   04/07/22 2000  cefTRIAXone (ROCEPHIN) 2 g in sodium chloride 0.9 % 100 mL IVPB        2 g 200 mL/hr over 30 Minutes Intravenous Every 24 hours 04/06/22 2105 04/11/22 1959   04/07/22 1030  metroNIDAZOLE (FLAGYL) IVPB 500 mg        500 mg 100 mL/hr over 60 Minutes Intravenous Every 12 hours 04/07/22 1007     04/07/22 1000  doxycycline (VIBRA-TABS) tablet 100 mg  Status:  Discontinued        100 mg Oral Every 12 hours 04/06/22 2105 04/07/22 1231   04/06/22 1945  doxycycline  (VIBRA-TABS) tablet 100 mg        100 mg Oral  Once 04/06/22 1930 04/06/22 2014   04/06/22 1930  cefTRIAXone (ROCEPHIN) 1 g in sodium chloride 0.9 % 100 mL IVPB        1 g 200 mL/hr over 30 Minutes Intravenous  Once 04/06/22 1927 04/06/22 2302        Assessment/Plan Small bowel mesenteric air/fluid collections, possible small bowel diverticulitis vs small bowel perforation The patient has been  seen, examined, chart, labs, vitals, and imaging personally reviewed.  Unfortunately these fluid collection areas do not appear to be amendable to drainage given small bowel surrounding these areas.  They are mostly air as well with minimal fluid as well so unsure drains would be helpful even if possible.  She is not septic and does not have peritonitis. With her recent dose of Eliquis I think it would be reasonable to let this wear off and possibly plan for surgery in the next 24-48 hrs (possibly reverse if needed).  I discussed this in detail with the patient as well as her husband.  I discussed the plans for surgery including its indications, risks and aftercare.  Both the patient and her husband are very hesitant about surgery and not sure they would want this for her.  We discussed risks of without surgery her worsening that could lead to complications including but not limited to sepsis and death. Patient and her husband both state they are not sure they would like for her to undergo surgery. They would like to talk with Cardiology to better understand her risk from a Cardiac standpoint. They would like to discuss with palliative to better establish GOC. I have communicated this with the primary team. For now please hold Eliquis (okay for heparin gtt) and continue with bowel rest and IV abx therapy.   FEN - NPO/IVFs VTE - hold eliquis, ok for heparin gtt if needed ID - Rocephin/Flagyl Foley - Of note, she did have acute urinary retention with 2L of urine in her bladder which almost certainly was  contributing to some of her abdominal pain as well.  Her pain has improved since foley placement.    PE on eliquis - hold eliquis H/O breast cancer HTN PVD H/o SCC of multiple areas CHF - last EF 45-50% in 2022  Alferd Apa, [facility] Surgery 04/07/2022, 12:15 PM Please see Amion for pager number during day hours 7:00am-4:30pm or 7:00am -11:30am on weekends

## 2022-04-07 NOTE — ED Notes (Signed)
Recollected light green and lav, sent to lab.

## 2022-04-07 NOTE — ED Notes (Signed)
PA at [facility], delayed transport to floor

## 2022-04-07 NOTE — ED Notes (Signed)
PA finished at [facility]. Pt up to floor via stretcher. Alert, NAD, calm, interactive, no changes. Husband present.

## 2022-04-07 NOTE — Consult Note (Addendum)
Cardiology Consultation   Patient ID: [name] MRN: [id number]; DOB: Sep 16, 1931  Admit date: 04/06/2022 Date of Consult: 04/07/2022  PCP:  Pamela Bunting, MD   [facility] Cardiologist:  Pamela Klein, MD     Patient Profile:   [name] is a 88 y.o. female with a hx of HFmrEF, hypertension, DVT/PE, hyperlipidemia, peripheral venous insufficiency, orthostatic hypotension and breast CA '95 who is being seen 04/07/2022 for the evaluation of preoperative evaluation at the request of Dr. Karleen Davis.  History of Present Illness:   [name] is an 88 year old female with past medical history noted above.  She is followed by [name] as an outpatient.  She has a known history of DVT as well as PE.  She wore heart monitor 01/2018 which showed sinus rhythm with episodes of nocturnal sinus bradycardia with frequent PVCs.  She was seen several months later for increased fatigue and it was felt her symptoms were inconsistent with chronotropic incompetence.  Orthostatic hypotension improved with cutting back her diuretic.  She was admitted to the hospital 10/19 after a fall.  Cardiology was consulted for preoperative clearance after she was found to have a distal right femur fracture.  Echocardiogram at that time showed LVEF of 60 to 14%, grade 1 diastolic dysfunction, basal hypertrophy of the septum, mild MR with peak PA pressure of 38 mmHg.  Underwent retrograde IM nail fixation of the right femur.  She was noted to have occasional sinus bradycardia with rates into the 40s during that hospitalization and AV nodal blocking agents were avoided.  Hospitalized 07/2019 with hypotension and noted to have LV dysfunction on echo with elevated troponin of 3600.  Echocardiogram showed LVEF of 40 to 45% with elevated mean left arterial pressure and elevated right arterial pressure, mild aortic insufficiency and mild to moderate aortic valve sclerosis.  She had a Myoview which showed a  medium size, moderate area of reversible ischemia in the mid apical inferior septal wall and EF was 69%.  She was seen in the office on 03/2020 [name].  She denied any anginal symptoms or signs of heart failure at this visit.  It was recommended that she avoid taking loop diuretic if her blood pressure was less than 150 mmHg given her history of orthostatic hypotension.  Also plans to avoid beta-blocker use given her tendency to bradycardia.  Presented to the ED on 9/13 with complaints of confusing and increasing lethargy.  Labs in the ED showed sodium 138, potassium 3.8, creatinine 0.6, lactic acid 0.8, WBC 12.4, hemoglobin 12.5.  EKG showed sinus rhythm, 69 bpm, first-degree AV block, RBBB, LPFB.  Developed abdominal pain in the left lower quadrant and underwent CT scan showing thickened small bowel loops in the left abdomen with a 3 cm collection of gas and debris in the mesentery thought to be a small bowel diverticulum or possible small bowel perforation.  She has been evaluated by general surgery with recommendations for bowel rest and antibiotic therapy.  Her Eliquis has been held with plans for possible surgery in the next 24 to 48 hours.  Cardiology now asked to evaluate.`  She tells me that she has not had any anginal symptoms.  Denies any chest pain or SOB and is able to walk 2 blocks or a flight of stairs without any CP or SOB.  She denies any PND, orthopnea, LE edema, dizziness, palpitations or syncope. She is very concerned about having surgery due to her heart issues.  Past Medical History:  Diagnosis Date   Arthritis    "in my back"   Breast cancer ([place]) 1996   left breast cancer    Complication of anesthesia 04/18/2012   "didn't tolerate it today very well; had the shakes and very hard time w/it"   Depression 11/20/2012   DVT (deep venous thrombosis) (HCC)    High cholesterol    History of alcoholism ([place])    7 1/2 years clean   Hypertension    Melanoma ([facility]) 01/15/2004    upper right arm   Osteoporosis    Peripheral vascular disease (HCC)    hx of ligation    Personal history of radiation therapy 1993   Left Breast Cancer   SCC (squamous cell carcinoma) 01/15/2004   left post upper arm, Right elbow, post left knee   SCC (squamous cell carcinoma) 05/21/2013   right forearm, right jawline, left cheek   SCC (squamous cell carcinoma) 08/24/2016   right shin,right thigh   SCC (squamous cell carcinoma) 11/01/2016   SCC (squamous cell carcinoma) 02/02/2018   left outer arm,right outer thigh   SCC (squamous cell carcinoma) 12/13/2018   right jawline   Spinal stenosis     Past Surgical History:  Procedure Laterality Date   BREAST LUMPECTOMY Left 1996   HARDWARE REMOVAL Left 09/03/2020   Procedure: HARDWARE REMOVAL;  Surgeon: Pamela Oak [place], MD;  Location: [facility];  Service: Orthopedics;  Laterality: Left;   I & D EXTREMITY  04/17/2012   Procedure: IRRIGATION AND DEBRIDEMENT EXTREMITY;  Surgeon: Pamela Haskell, MD;  Location: [facility];  Service: Orthopedics;  Laterality: Right;   INTRAMEDULLARY (IM) NAIL INTERTROCHANTERIC Left 09/03/2020   Procedure: INTRAMEDULLARY (IM) NAIL INTERTROCHANTRIC;  Surgeon: Pamela [place], MD;  Location: [facility];  Service: Orthopedics;  Laterality: Left;   KNEE ARTHROSCOPY  04/17/2012   Procedure: ARTHROSCOPY KNEE;  Surgeon: Pamela Haskell, MD;  Location: [facility];  Service: Orthopedics;  Laterality: Right;   KYPHOPLASTY  2009, 2013   thorasic, lumbar   MENISECTOMY Right 04/11/2012   ORIF FEMUR FRACTURE Left 11/01/2019   Procedure: OPEN REDUCTION INTERNAL FIXATION (ORIF) DISTAL FEMUR FRACTURE;  Surgeon: Pamela Almont, MD;  Location: [facility];  Service: Orthopedics;  Laterality: Left;   ORIF PERIPROSTHETIC FRACTURE Right 05/03/2018   Procedure: RIGHT RETROGRADE FEMORAL NAIL;  Surgeon: Pamela Can, MD;  Location: [facility];  Service: Orthopedics;  Laterality: Right;   POSTERIOR LAMINECTOMY / DECOMPRESSION LUMBAR SPINE  1980's   rotator cuff  surgery  Right    TONSILLECTOMY AND ADENOIDECTOMY     "I was a child"   TOTAL KNEE ARTHROPLASTY Right 10/29/2012   Procedure: RIGHT TOTAL KNEE ARTHROPLASTY;  Surgeon: Gearlean Alf, MD;  Location: [facility];  Service: Orthopedics;  Laterality: Right;   VEIN LIGATION       Home Medications:  Prior to Admission medications   Medication Sig Start Date End Date Taking? Authorizing Provider  acetaminophen (TYLENOL) 325 MG tablet Take 2 tablets (650 mg total) by mouth every 6 (six) hours as needed for mild pain or moderate pain. 09/09/20  Yes Barb Merino, MD  alendronate (FOSAMAX) 70 MG tablet Take 1 tablet (70 mg total) by mouth once a week. Take with a full glass of water on an empty stomach. On sundays Patient taking differently: Take 70 mg by mouth once a week. 10/12/20  Yes Fargo, Amy E, NP  apixaban (ELIQUIS) 5 MG TABS tablet Take 1 tablet (5 mg total) by mouth 2 (two)  times daily. 10/12/20  Yes Fargo, Amy E, NP  bisacodyl (DULCOLAX) 10 MG suppository Place 10 mg rectally daily as needed for moderate constipation.   Yes [provider]  Calcium Carbonate-Vitamin D (CALCIUM-D) 600-400 MG-UNIT TABS Take 1 tablet by mouth daily.   Yes [provider]  Ensure (ENSURE) Take 237 mLs by mouth daily in the afternoon.   Yes [provider]  escitalopram (LEXAPRO) 20 MG tablet Take 1 tablet (20 mg total) by mouth daily. 10/12/20  Yes Fargo, Amy E, NP  furosemide (LASIX) 40 MG tablet TAKE 1 TABLET (40 MG TOTAL) BY MOUTH 3 (THREE) TIMES A WEEK. Patient taking differently: Take 40 mg by mouth 3 (three) times a week. Take one tablet by mouth on Monday, Wednesday, Fridays 04/14/21  Yes Croitoru, Mihai, MD  gabapentin (NEURONTIN) 300 MG capsule Take 300 mg by mouth 4 (four) times daily. 03/10/22  Yes [provider]  losartan (COZAAR) 25 MG tablet Take 0.5 tablets (12.5 mg total) by mouth daily. 10/12/20  Yes Fargo, Amy E, NP  Multiple Vitamins-Minerals (MULTIVITAMIN ADULT) TABS  Take 1 tablet by mouth daily.  08/17/09  Yes [provider]  omeprazole (PRILOSEC) 40 MG capsule Take 1 capsule (40 mg total) by mouth daily. 10/12/20  Yes Fargo, Amy E, NP  rosuvastatin (CRESTOR) 20 MG tablet Take 1 tablet (20 mg total) by mouth daily at 6 PM. Patient taking differently: Take 20 mg by mouth daily. 10/12/20  Yes Fargo, Amy E, NP  senna-docusate (SENOKOT-S) 8.6-50 MG tablet Take 1 tablet by mouth at bedtime. Patient taking differently: Take 1 tablet by mouth at bedtime as needed for mild constipation. 10/12/20  Yes Fargo, Amy E, NP  tamsulosin (FLOMAX) 0.4 MG CAPS capsule Take 1 capsule (0.4 mg total) by mouth daily. 10/12/20  Yes Fargo, Amy E, NP    Inpatient Medications: Scheduled Meds:  calcium-vitamin D  1 tablet Oral Daily   Chlorhexidine Gluconate Cloth  6 each Topical Daily   escitalopram  20 mg Oral Daily   gabapentin  300 mg Oral QID   heparin  3,000 Units Intravenous Once   pantoprazole (PROTONIX) IV  40 mg Intravenous Q24H   tamsulosin  0.4 mg Oral Daily   Continuous Infusions:  cefTRIAXone (ROCEPHIN)  IV     heparin     metronidazole Stopped (04/07/22 1235)   PRN Meds: morphine injection, senna-docusate  Allergies:    Allergies  Allergen Reactions   Oysters [Shellfish Allergy] Nausea And Vomiting   Codeine Nausea And Vomiting   Penicillins Rash    Social History:   Social History   Socioeconomic History   Marital status: Married    Spouse name: Not on file   Number of children: 2   Years of education: Not on file   Highest education level: Not on file  Occupational History   Occupation: retired  Tobacco Use   Smoking status: Never   Smokeless tobacco: Never  Vaping Use   Vaping Use: Never used  Substance and Sexual Activity   Alcohol use: No   Drug use: No   Sexual activity: Not on file  Other Topics Concern   Not on file  Social History Narrative   Not on file   Social Determinants of Health   Financial Resource Strain:  Not on file  Food Insecurity: No Food Insecurity (04/06/2022)   Hunger Vital Sign    Worried About Running Out of Food in the Last Year: Never true  Ran Out of Food in the Last Year: Never true  Transportation Needs: No Transportation Needs (04/06/2022)   PRAPARE - Hydrologist (Medical): No    Lack of Transportation (Non-Medical): No  Physical Activity: Not on file  Stress: Not on file  Social Connections: Not on file  Intimate Partner Violence: Not At Risk (04/07/2022)   Humiliation, Afraid, Rape, and Kick questionnaire    Fear of Current or Ex-Partner: No    Emotionally Abused: No    Physically Abused: No    Sexually Abused: No    Family History:    Family History  Problem Relation Age of Onset   Kidney failure Mother    Anuerysm Father        AAA     ROS:  Please see the history of present illness.   All other ROS reviewed and negative.     Physical Exam/Data:   Vitals:   04/07/22 1343 04/07/22 1345 04/07/22 1400 04/07/22 1435  BP: (!) 111/53  (!) 119/59 131/65  Pulse: 65 65  63  Resp: (!) 22 (!) 21  18  Temp:    98.5 F (36.9 C)  TempSrc:    Oral  SpO2: 92% 96%  91%  Weight:      Height:        Intake/Output Summary (Last 24 hours) at 04/07/2022 1653 Last data filed at 04/07/2022 1524 Gross per 24 hour  Intake 2150 ml  Output 2725 ml  Net -575 ml      04/07/2022    2:25 AM 10/12/2020   11:29 AM 09/15/2020    9:27 AM  Last 3 Weights  Weight (lbs) 119 lb 0.8 oz 120 lb 12.8 oz 137 lb 4.8 oz  Weight (kg) 54 kg 54.795 kg 62.279 kg     Body mass index is 19.21 kg/m.  General:  Well nourished, well developed, in no acute distress HEENT: normal Neck: no JVD Vascular: No carotid bruits; Distal pulses 2+ bilaterally Cardiac:  normal S1, S2; RRR; no murmur  Lungs:  clear to auscultation bilaterally, no wheezing, rhonchi or rales  Abd: soft, nontender, no hepatomegaly  Ext: no edema Musculoskeletal:  No deformities, BUE and BLE  strength normal and equal Skin: warm and dry  Neuro:  CNs 2-12 intact, no focal abnormalities noted Psych:  Normal affect   EKG:  The EKG was personally reviewed and demonstrates:  NSR with PVCs and RBBB Telemetry:  Telemetry was personally reviewed and demonstrates:  NSR  Relevant CV Studies:  Echo: 08/2020  IMPRESSIONS     1. Left ventricular ejection fraction, by estimation, is 45 to 50%. The  left ventricle has mildly decreased function. The left ventricle  demonstrates regional wall motion abnormalities (see scoring  diagram/findings for description). There is severe  asymmetric left ventricular hypertrophy of the basal-septal segment. Left  ventricular diastolic parameters are consistent with Grade I diastolic  dysfunction (impaired relaxation). There is moderate hypokinesis of the  left ventricular, basal-mid  inferolateral wall.   2. Right ventricular systolic function is mildly reduced. The right  ventricular size is mildly enlarged. There is moderately elevated  pulmonary artery systolic pressure. The estimated right ventricular  systolic pressure is 02.5 mmHg.   3. Left atrial size was severely dilated.   4. The pericardial effusion is posterior to the left ventricle.   5. The mitral valve is abnormal. Mild mitral valve regurgitation.   6. Tricuspid valve regurgitation is mild to moderate.  7. The aortic valve was not well visualized. Aortic valve regurgitation  is mild. Mild aortic valve stenosis. Aortic regurgitation PHT measures 591  msec. Aortic valve area, by VTI measures 2.06 cm. Aortic valve mean  gradient measures 7.0 mmHg. Aortic  valve Vmax measures 1.82 m/s.   8. Aortic dilatation noted. There is borderline dilatation of the  ascending aorta, measuring 39 mm.   9. The inferior vena cava is dilated in size with <50% respiratory  variability, suggesting right atrial pressure of 15 mmHg.   Comparison(s): Changes from prior study are noted. 11/02/2019: LVEF  40-45%,  global hypokinesis, RVSP 43 mmHg, moderate RV dysfunction.   FINDINGS   Left Ventricle: Left ventricular ejection fraction, by estimation, is 45  to 50%. The left ventricle has mildly decreased function. The left  ventricle demonstrates regional wall motion abnormalities. Moderate  hypokinesis of the left ventricular,  basal-mid inferolateral wall. The left ventricular internal cavity size  was normal in size. There is severe asymmetric left ventricular  hypertrophy of the basal-septal segment. Left ventricular diastolic  parameters are consistent with Grade I diastolic  dysfunction (impaired relaxation). Indeterminate filling pressures.   Right Ventricle: The right ventricular size is mildly enlarged. No  increase in right ventricular wall thickness. Right ventricular systolic  function is mildly reduced. There is moderately elevated pulmonary artery  systolic pressure. The tricuspid  regurgitant velocity is 2.96 m/s, and with an assumed right atrial  pressure of 15 mmHg, the estimated right ventricular systolic pressure is  38.1 mmHg.   Left Atrium: Left atrial size was severely dilated.   Right Atrium: Right atrial size was normal in size.   Pericardium: Trivial pericardial effusion is present. The pericardial  effusion is posterior to the left ventricle.   Mitral Valve: The mitral valve is abnormal. There is moderate thickening  of the mitral valve leaflet(s). There is moderate calcification of the  mitral valve leaflet(s). Mild mitral valve regurgitation. MV peak  gradient, 9.9 mmHg. The mean mitral valve  gradient is 3.0 mmHg.   Tricuspid Valve: The tricuspid valve is grossly normal. Tricuspid valve  regurgitation is mild to moderate.   Aortic Valve: The aortic valve was not well visualized. Aortic valve  regurgitation is mild. Aortic regurgitation PHT measures 591 msec. Mild  aortic stenosis is present. Aortic valve mean gradient measures 7.0 mmHg.  Aortic  valve peak gradient measures  13.2 mmHg. Aortic valve area, by VTI measures 2.06 cm.   Pulmonic Valve: The pulmonic valve was grossly normal. Pulmonic valve  regurgitation is not visualized.   Aorta: Aortic dilatation noted. There is borderline dilatation of the  ascending aorta, measuring 39 mm.   Venous: The inferior vena cava is dilated in size with less than 50%  respiratory variability, suggesting right atrial pressure of 15 mmHg.   IAS/Shunts: No atrial level shunt detected by color flow Doppler.   Laboratory Data:  High Sensitivity Troponin:  No results for input(s): "TROPONINIHS" in the last 720 hours.   Chemistry Recent Labs  Lab 04/06/22 1629 04/07/22 0523  NA 138 136  K 3.8 3.7  CL 105 103  CO2 25 25  GLUCOSE 106* 96  BUN 19 18  CREATININE 0.65 0.66  CALCIUM 9.0 9.0  MG 1.6*  --   GFRNONAA >60 >60  ANIONGAP 8 8    Recent Labs  Lab 04/06/22 1629 04/07/22 0523  PROT 6.6 6.4*  ALBUMIN 3.5 3.2*  AST 31 23  ALT 24 20  ALKPHOS 63 66  BILITOT 1.5* 1.6*   Lipids No results for input(s): "CHOL", "TRIG", "HDL", "LABVLDL", "LDLCALC", "CHOLHDL" in the last 168 hours.  Hematology Recent Labs  Lab 04/06/22 1629 04/07/22 0354  WBC 12.4* 11.3*  RBC 4.10 4.11  HGB 12.5 12.7  HCT 38.3 38.1  MCV 93.4 92.7  MCH 30.5 30.9  MCHC 32.6 33.3  RDW 14.0 13.9  PLT 168 147*   Thyroid No results for input(s): "TSH", "FREET4" in the last 168 hours.  BNPNo results for input(s): "BNP", "PROBNP" in the last 168 hours.  DDimer No results for input(s): "DDIMER" in the last 168 hours.   Radiology/Studies:  CT ABDOMEN PELVIS W CONTRAST  Result Date: 04/07/2022 CLINICAL DATA:  Left lower quadrant pain. EXAM: CT ABDOMEN AND PELVIS WITH CONTRAST TECHNIQUE: Multidetector CT imaging of the abdomen and pelvis was performed using the standard protocol following bolus administration of intravenous contrast. RADIATION DOSE REDUCTION: This exam was performed according to the  departmental dose-optimization program which includes automated exposure control, adjustment of the mA and/or kV according to patient size and/or use of iterative reconstruction technique. CONTRAST:  171m OMNIPAQUE IOHEXOL 300 MG/ML  SOLN COMPARISON:  None Available. FINDINGS: Lower chest: Large hiatal hernia with 75-100% of the stomach contained in the chest. Bibasilar collapse/consolidation with small bilateral pleural effusions. Hepatobiliary: No suspicious focal abnormality within the liver parenchyma. There is no evidence for gallstones, gallbladder wall thickening, or pericholecystic fluid. No intrahepatic or extrahepatic biliary dilation. Pancreas: No focal mass lesion. No dilatation of the main duct. No intraparenchymal cyst. No peripancreatic edema. Spleen: No splenomegaly. No focal mass lesion. Adrenals/Urinary Tract: No adrenal nodule or mass. Cortical scarring noted upper pole right kidney. Kidneys otherwise unremarkable. No evidence for hydroureter. Bladder is markedly distended measuring 19.1 x 16.4 x 12.9 cm. Stomach/Bowel: Large hiatal hernia, as above. Duodenum is normally positioned as is the ligament of Treitz. Small bowel loops in the left abdomen show ill-defined wall thickening with perienteric edema/inflammation. Two discrete collections of gas and debris are identified. One of these is visible on axial 36/2 and coronal 60/5 measuring about 3 cm in size. A second bilobed collection of gas is identified more inferiorly (image 40/2 and coronal 54/5). This collection appears more thin walled and contains only gas without associated fluid debris. Numerous scattered small bowel diverticuli are identified. Terminal ileum not well visualized. The appendix is best seen on coronal images and is unremarkable. No gross colonic mass. No colonic wall thickening. Diverticuli are seen scattered along the entire length of the colon without CT findings of diverticulitis. Vascular/Lymphatic: There is moderate  atherosclerotic calcification of the abdominal aorta without aneurysm. There is no gastrohepatic or hepatoduodenal ligament lymphadenopathy. No retroperitoneal or mesenteric lymphadenopathy. No pelvic sidewall lymphadenopathy. Reproductive: The uterus is unremarkable.  There is no adnexal mass. Other: No intraperitoneal free fluid. Musculoskeletal: Bones are diffusely demineralized. Fixation hardware noted in the left femoral neck and proximal right femur. Multilevel thoracolumbar compression deformity evident with prior vertebral augmentation at L1, L2, and L4. IMPRESSION: 1. Small bowel loops in the left abdomen show ill-defined wall thickening with associated edema/inflammation. 3 cm collection of gas and debris identified in the left abdominal mesentery. Given that there is other diverticular disease in the small bowel, this is probably an enlarged, thick-walled inflamed diverticulum. However, small bowel perforation contained in the adjacent mesentery could have a similar appearance and is not excluded on this study. 2. A second bilobed large collection of gas is identified more inferiorly in the left small  bowel mesentery. This collection has no perceptible wall and contains only gas without associated fluid debris. This is felt to represent a patulous small bowel diverticulum although extraluminal gas collection is not excluded. 3. Marked distention of the urinary bladder. Correlation for bladder dysmotility or outlet obstruction recommended. 4. Large hiatal hernia with 75-100% of the stomach contained in the chest. 5. Bibasilar collapse/consolidation with small bilateral pleural effusions. 6. Diffuse colonic diverticulosis without diverticulitis. 7. Multilevel thoracolumbar compression deformity with prior vertebral augmentation at L1, L2, and L4. 8. Aortic Atherosclerosis (ICD10-I70.0). Electronically Signed   By: Misty Stanley M.D.   On: 04/07/2022 08:07   DG Chest Port 1 View  Result Date:  04/06/2022 CLINICAL DATA:  Possible sepsis, weakness, confusion EXAM: PORTABLE CHEST 1 VIEW COMPARISON:  Previous studies including the chest radiograph done on 09/04/2020 FINDINGS: Transverse diameter of heart is increased. There is soft tissue fullness in the retrocardiac region suggesting possible fixed hiatal hernia. There are no signs of alveolar pulmonary edema or focal pulmonary consolidation. There is no pleural effusion or pneumothorax. Surgical clips are seen in left axilla. Deformities are noted in multiple left ribs with no significant interval change suggesting old fractures. Degenerative changes are noted in both shoulders, more so on the right side. IMPRESSION: Cardiomegaly. There are no signs of pulmonary edema or focal pulmonary consolidation. Possible fixed hiatal hernia. Electronically Signed   By: Elmer Picker M.D.   On: 04/06/2022 16:40     Assessment and Plan:   DUAA STELZNER is a 88 y.o. female with a hx of HFmrEF, hypertension, DVT/PE, hyperlipidemia, peripheral venous insufficiency, orthostatic hypotension and breast CA '95 who is being seen 04/07/2022 for the evaluation of preoperative evaluation at the request of Dr. Karleen Davis.  Pre op evaluation Small bowel diverticulitis versus small bowel perforation -she is very stable from a cardiac standpoint.   -she tells me that she has not had any CP or SOB recently and that she Davis complete at least 4.5 mets without cardiac symptoms although the nurse tells me that both the patient and husband have some memory issues so I am worried there her history may not be accurate as she appears fairly frail in bed.  I tried to call her daughter, Magda Paganini, but could not get in touch with her -She does not appear volume overloaded on exam today -EKG is unchanged -2D echo 2/22 showed EF 45-50% with severe basal septal hypertrophy and G1DD with HK of the basal to mid inferolateral wall, mild RVE and mildly reduced RVF, moderate PHTN, severe LAE,  and mild MR/AR with mild AS -she had an admission in 2021 with Trop 3600 and EF 40-45% on echo and myoview with medium size moderate reversible ischemia in the mid to apical inferoseptal wall with EF 69% but also around the same time had small bilateral pulmonary emboli with RV strain and CT showed that the only area of calcification in coronaries was very mild plaque in the LAD and felt possibly could have been Takotsubo CM -I think we should repeat a 2D echo to make sure it is stable.  -Her perioperative risk of major cardiac event in the periop period is high at 11% based on her revised cardiac risk score but she appears well compensated. -Although her cardiac risk is high, I do not think it is prohibitive -would avoid volume overload in the periop period    For questions or updates, please contact [facility] [place] Please consult [email or web address] for contact info under  Signed, Fransico Him, MD  04/07/2022 4:53 PM

## 2022-04-07 NOTE — ED Notes (Signed)
NT attempt at foley w/o success.

## 2022-04-07 NOTE — ED Notes (Signed)
Calling out for husband "Clair Gulling". Updated, re-oriented, reassured.

## 2022-04-07 NOTE — ED Notes (Signed)
PT back from CT. Bladder distended on CT. Acute urinary retention. Has been on purewick w/o return. Foley order received. PT alert, NAD, calm, interactive, resps e/u, speaking clearly.

## 2022-04-07 NOTE — Progress Notes (Signed)
PROGRESS NOTE    [name]  YWV:[id number] DOB: 03-29-1932 DOA: 04/06/2022 PCP: Burnard Bunting, MD    Chief Complaint  Patient presents with   Weakness    Brief Narrative:   [name] is a 88 y.o. female with medical history significant of PE on eliquis, CHF, HTN, presented  to ED with onset of confusion and lethargy at home. She was found to be febrile , hypotensive ,  reports persistent cough and abdominal pain.  Initial CXR is negative. CT abd and pelvis show Small bowel loops in the left abdomen show ill-defined wall thickening with associated edema/inflammation. 3 cm collection of gas and debris identified in the left abdominal mesentery. . A second bilobed large collection of gas is identified more inferiorly in the left small bowel mesentery. This collection has no perceptible wall and contains only gas without associated fluid debris. Small bowel perforation cannot be excluded. She was also found to have stomach in a large hiatal hernia.  This morning she had urinary retention anda foley catheter was placed.   General surgery consulted for abnormal ct findings, was started on IV antibiotics.    Assessment & Plan:   Principal Problem:   Sepsis ([place]) Active Problems:   CAP (community acquired pneumonia)   Acute encephalopathy   AV block, Mobitz 1   Essential hypertension, benign   Chronic systolic CHF (congestive heart failure) ([place])   History of pulmonary embolism   Sepsis:  Febrile 100.5, tachypnea 36/min, hypotensive 97/43 mm of hg, leukocytosis and abnormal CT showing possible small bowel inflammation , with air and debris in the small bowel mesentery.  Started her on IV fluids, IV rocephin and IV flagyl.  NPO .  General surgery consulted .  Stop the eliquis .  Requested cardiology for clearance for surgery for possible laparotomy.      H/o PE on Eliquis on hold for possible procedure by surgery.  Will start her on IV heparin.    H/o chronic  systolic and diastolic heart failure:  Echo on 08/2020 LVEF is 45 to 85%, grade 1 diastolic dysfunction.   Acute metabolic encephalopathy:  Appears to have improved, she is more alert and answering all questions appropriately.   Hypertension: hypotension resolved. BP parameters have improved with IV fluids.    Cough: fever:  No Pneumonia on CXR.  Cough probably from large hiatal hernia.  COVID PCR is negative.  Respiratory panel is pending.  Urine for strep pneumonia is negative.  Follow blood cultures.   Hypomagnesemia: replaced.    DVT prophylaxis: scd's. Heparin.  Code Status: full code.  Family Communication: none at bedside.  Disposition:   Status is: Observation The patient will require care spanning > 2 midnights and should be moved to inpatient because: abnormal CT . IV antibiotics.    Level of care: Telemetry Consultants:  General surgery.   Procedures: none.   Antimicrobials: Antibiotics Given (last 72 hours)     Date/Time Action Medication Dose Rate   04/06/22 2013 New Bag/Given   cefTRIAXone (ROCEPHIN) 1 g in sodium chloride 0.9 % 100 mL IVPB 1 g 200 mL/hr   04/06/22 2014 Given   doxycycline (VIBRA-TABS) tablet 100 mg 100 mg    04/07/22 1006 Given   doxycycline (VIBRA-TABS) tablet 100 mg 100 mg    04/07/22 1032 New Bag/Given   metroNIDAZOLE (FLAGYL) IVPB 500 mg 500 mg 100 mL/hr         Subjective:  BM yesterday,  Objective: Vitals:  04/07/22 0800 04/07/22 0830 04/07/22 0930 04/07/22 0935  BP: 133/67 107/61 (!) 104/55   Pulse: 61 72 64   Resp: '18 18 20   '$ Temp:    98.7 F (37.1 C)  TempSrc:    Oral  SpO2: 94% 93% 93%   Weight:      Height:        Intake/Output Summary (Last 24 hours) at 04/07/2022 1008 Last data filed at 04/07/2022 0845 Gross per 24 hour  Intake 50 ml  Output 2000 ml  Net -1950 ml   Filed Weights   04/07/22 0225  Weight: 54 kg    Examination:  General exam: Appears calm and comfortable  Respiratory system:  Clear to auscultation. Respiratory effort normal. Cardiovascular system: S1 & S2 heard, RRR. No JVD,No pedal edema. Gastrointestinal system: Abdomen is soft, mildly distended, tender int he left upper quadrant and around the umbilicus. Bowel sounds wnl.  Central nervous system: Alert and oriented. No focal neurological deficits. Extremities: Symmetric 5 x 5 power. Skin: No rashes, lesions or ulcers Psychiatry:  Mood & affect appropriate.     Data Reviewed: I have personally reviewed following labs and imaging studies  CBC: Recent Labs  Lab 04/06/22 1629 04/07/22 0354  WBC 12.4* 11.3*  NEUTROABS 8.9*  --   HGB 12.5 12.7  HCT 38.3 38.1  MCV 93.4 92.7  PLT 168 147*    Basic Metabolic Panel: Recent Labs  Lab 04/06/22 1629 04/07/22 0523  NA 138 136  K 3.8 3.7  CL 105 103  CO2 25 25  GLUCOSE 106* 96  BUN 19 18  CREATININE 0.65 0.66  CALCIUM 9.0 9.0  MG 1.6*  --     GFR: Estimated Creatinine Clearance: 42.2 mL/min (by C-G formula based on SCr of 0.66 mg/dL).  Liver Function Tests: Recent Labs  Lab 04/06/22 1629 04/07/22 0523  AST 31 23  ALT 24 20  ALKPHOS 63 66  BILITOT 1.5* 1.6*  PROT 6.6 6.4*  ALBUMIN 3.5 3.2*    CBG: No results for input(s): "GLUCAP" in the last 168 hours.   Recent Results (from the past 240 hour(s))  SARS Coronavirus 2 by RT PCR (hospital order, performed in [facility] hospital lab) *cepheid single result test* Anterior Nasal Swab     Status: None   Collection Time: 04/06/22  4:28 PM   Specimen: Anterior Nasal Swab  Result Value Ref Range Status   SARS Coronavirus 2 by RT PCR NEGATIVE NEGATIVE Final    Comment: (NOTE) SARS-CoV-2 target nucleic acids are NOT DETECTED.  The SARS-CoV-2 RNA is generally detectable in upper and lower respiratory specimens during the acute phase of infection. The lowest concentration of SARS-CoV-2 viral copies this assay can detect is 250 copies / mL. A negative result does not preclude SARS-CoV-2  infection and should not be used as the sole basis for treatment or other patient management decisions.  A negative result may occur with improper specimen collection / handling, submission of specimen other than nasopharyngeal swab, presence of viral mutation(s) within the areas targeted by this assay, and inadequate number of viral copies (<250 copies / mL). A negative result must be combined with clinical observations, patient history, and epidemiological information.  Fact Sheet for Patients:   https://www.patel.info/  Fact Sheet for Healthcare Providers: https://hall.com/  This test is not yet approved or  cleared by the [place] FDA and has been authorized for detection and/or diagnosis of SARS-CoV-2 by FDA under an Emergency Use Authorization (EUA).  This EUA will remain in effect (meaning this test can be used) for the duration of the COVID-19 declaration under Section 564(b)(1) of the Act, 21 U.S.C. section 360bbb-3(b)(1), unless the authorization is terminated or revoked sooner.  Performed at [facility], [place] [address], [place], [place] [address]   Blood Culture (routine x 2)     Status: None (Preliminary result)   Collection Time: 04/06/22  4:30 PM   Specimen: BLOOD  Result Value Ref Range Status   Specimen Description   Final    BLOOD BLOOD RIGHT ARM Performed at [facility] [address], [place], [place] [address]    Special Requests   Final    BOTTLES DRAWN AEROBIC AND ANAEROBIC Blood Culture results may not be optimal due to an inadequate volume of blood received in culture bottles Performed at [facility] [address], [place], [place] [address]    Culture   Final    NO GROWTH < 12 HOURS Performed at [facility] [address], [place], [place] [address]    Report Status PENDING  Incomplete         Radiology Studies: CT ABDOMEN  PELVIS W CONTRAST  Result Date: 04/07/2022 CLINICAL DATA:  Left lower quadrant pain. EXAM: CT ABDOMEN AND PELVIS WITH CONTRAST TECHNIQUE: Multidetector CT imaging of the abdomen and pelvis was performed using the standard protocol following bolus administration of intravenous contrast. RADIATION DOSE REDUCTION: This exam was performed according to the departmental dose-optimization program which includes automated exposure control, adjustment of the mA and/or kV according to patient size and/or use of iterative reconstruction technique. CONTRAST:  171m OMNIPAQUE IOHEXOL 300 MG/ML  SOLN COMPARISON:  None Available. FINDINGS: Lower chest: Large hiatal hernia with 75-100% of the stomach contained in the chest. Bibasilar collapse/consolidation with small bilateral pleural effusions. Hepatobiliary: No suspicious focal abnormality within the liver parenchyma. There is no evidence for gallstones, gallbladder wall thickening, or pericholecystic fluid. No intrahepatic or extrahepatic biliary dilation. Pancreas: No focal mass lesion. No dilatation of the main duct. No intraparenchymal cyst. No peripancreatic edema. Spleen: No splenomegaly. No focal mass lesion. Adrenals/Urinary Tract: No adrenal nodule or mass. Cortical scarring noted upper pole right kidney. Kidneys otherwise unremarkable. No evidence for hydroureter. Bladder is markedly distended measuring 19.1 x 16.4 x 12.9 cm. Stomach/Bowel: Large hiatal hernia, as above. Duodenum is normally positioned as is the ligament of Treitz. Small bowel loops in the left abdomen show ill-defined wall thickening with perienteric edema/inflammation. Two discrete collections of gas and debris are identified. One of these is visible on axial 36/2 and coronal 60/5 measuring about 3 cm in size. A second bilobed collection of gas is identified more inferiorly (image 40/2 and coronal 54/5). This collection appears more thin walled and contains only gas without associated fluid debris.  Numerous scattered small bowel diverticuli are identified. Terminal ileum not well visualized. The appendix is best seen on coronal images and is unremarkable. No gross colonic mass. No colonic wall thickening. Diverticuli are seen scattered along the entire length of the colon without CT findings of diverticulitis. Vascular/Lymphatic: There is moderate atherosclerotic calcification of the abdominal aorta without aneurysm. There is no gastrohepatic or hepatoduodenal ligament lymphadenopathy. No retroperitoneal or mesenteric lymphadenopathy. No pelvic sidewall lymphadenopathy. Reproductive: The uterus is unremarkable.  There is no adnexal mass. Other: No intraperitoneal free fluid. Musculoskeletal: Bones are diffusely demineralized. Fixation hardware noted in the left femoral neck and proximal right femur. Multilevel thoracolumbar compression deformity  evident with prior vertebral augmentation at L1, L2, and L4. IMPRESSION: 1. Small bowel loops in the left abdomen show ill-defined wall thickening with associated edema/inflammation. 3 cm collection of gas and debris identified in the left abdominal mesentery. Given that there is other diverticular disease in the small bowel, this is probably an enlarged, thick-walled inflamed diverticulum. However, small bowel perforation contained in the adjacent mesentery could have a similar appearance and is not excluded on this study. 2. A second bilobed large collection of gas is identified more inferiorly in the left small bowel mesentery. This collection has no perceptible wall and contains only gas without associated fluid debris. This is felt to represent a patulous small bowel diverticulum although extraluminal gas collection is not excluded. 3. Marked distention of the urinary bladder. Correlation for bladder dysmotility or outlet obstruction recommended. 4. Large hiatal hernia with 75-100% of the stomach contained in the chest. 5. Bibasilar collapse/consolidation with  small bilateral pleural effusions. 6. Diffuse colonic diverticulosis without diverticulitis. 7. Multilevel thoracolumbar compression deformity with prior vertebral augmentation at L1, L2, and L4. 8. Aortic Atherosclerosis (ICD10-I70.0). Electronically Signed   By: Misty Stanley M.D.   On: 04/07/2022 08:07   DG Chest Port 1 View  Result Date: 04/06/2022 CLINICAL DATA:  Possible sepsis, weakness, confusion EXAM: PORTABLE CHEST 1 VIEW COMPARISON:  Previous studies including the chest radiograph done on 09/04/2020 FINDINGS: Transverse diameter of heart is increased. There is soft tissue fullness in the retrocardiac region suggesting possible fixed hiatal hernia. There are no signs of alveolar pulmonary edema or focal pulmonary consolidation. There is no pleural effusion or pneumothorax. Surgical clips are seen in left axilla. Deformities are noted in multiple left ribs with no significant interval change suggesting old fractures. Degenerative changes are noted in both shoulders, more so on the right side. IMPRESSION: Cardiomegaly. There are no signs of pulmonary edema or focal pulmonary consolidation. Possible fixed hiatal hernia. Electronically Signed   By: Elmer Picker M.D.   On: 04/06/2022 16:40        Scheduled Meds:  apixaban  5 mg Oral BID   calcium-vitamin D  1 tablet Oral Daily   doxycycline  100 mg Oral Q12H   escitalopram  20 mg Oral Daily   gabapentin  300 mg Oral QID   multivitamin with minerals  1 tablet Oral Daily   pantoprazole  80 mg Oral Daily   rosuvastatin  20 mg Oral Daily   tamsulosin  0.4 mg Oral Daily   Continuous Infusions:  cefTRIAXone (ROCEPHIN)  IV     lactated ringers 150 mL/hr at 04/07/22 0941   metronidazole       LOS: 0 days    Time spent: 52 minutes    Hosie Poisson, MD [facility]   To contact the attending provider between 7A-7P or the covering provider during after hours 7P-7A, please log into the web site [email or web address] and access  using universal [place] password for that web site. If you do not have the password, please call the hospital operator.  04/07/2022, 10:08 AM

## 2022-04-07 NOTE — ED Notes (Signed)
Intermittent confusion, re-orientation

## 2022-04-07 NOTE — ED Notes (Signed)
Husband updated via phone, will come around 1200 noon.

## 2022-04-07 NOTE — Progress Notes (Signed)
Pt with new c/o 7/10 LLQ abd pain, no N/V at this point. Checking CT AP to r/o IAI / diverticulitis. Tylenol PRN Morphine PRN ordered if needed If IAI suggested by CT: will need to add flagyl to the rocephin shes already on.

## 2022-04-07 NOTE — ED Notes (Signed)
Pt expressed relief of her pain following Tylenol.

## 2022-04-07 NOTE — Progress Notes (Signed)
ANTICOAGULATION CONSULT NOTE - Initial Consult  Pharmacy Consult for Heparin Indication:  h/o DVT, PE  Allergies  Allergen Reactions   Oysters [Shellfish Allergy] Nausea And Vomiting   Codeine Nausea And Vomiting   Penicillins Rash    Patient Measurements: Height: '5\' 6"'$  (167.6 cm) Weight: 54 kg (119 lb 0.8 oz) IBW/kg (Calculated) : 59.3 Heparin Dosing Weight: 54 kg  Vital Signs: Temp: 98.5 F (36.9 C) (09/14 1435) Temp Source: Oral (09/14 1435) BP: 131/65 (09/14 1435) Pulse Rate: 63 (09/14 1435)  Labs: Recent Labs    04/06/22 1629 04/07/22 0354 04/07/22 0523  HGB 12.5 12.7  --   HCT 38.3 38.1  --   PLT 168 147*  --   APTT 40*  --   --   LABPROT 17.5*  --   --   INR 1.5*  --   --   CREATININE 0.65  --  0.66    Estimated Creatinine Clearance: 42.2 mL/min (by C-G formula based on SCr of 0.66 mg/dL).   Medical History: Past Medical History:  Diagnosis Date   Arthritis    "in my back"   Breast cancer ([place]) 1996   left breast cancer    Complication of anesthesia 04/18/2012   "didn't tolerate it today very well; had the shakes and very hard time w/it"   Depression 11/20/2012   DVT (deep venous thrombosis) ([facility])    High cholesterol    History of alcoholism ([place])    7 1/2 years clean   Hypertension    Melanoma ([place]) 01/15/2004   upper right arm   Osteoporosis    Peripheral vascular disease ([facility])    hx of ligation    Personal history of radiation therapy 1993   Left Breast Cancer   SCC (squamous cell carcinoma) 01/15/2004   left post upper arm, Right elbow, post left knee   SCC (squamous cell carcinoma) 05/21/2013   right forearm, right jawline, left cheek   SCC (squamous cell carcinoma) 08/24/2016   right shin,right thigh   SCC (squamous cell carcinoma) 11/01/2016   SCC (squamous cell carcinoma) 02/02/2018   left outer arm,right outer thigh   SCC (squamous cell carcinoma) 12/13/2018   right jawline   Spinal stenosis    Assessment: Active Problem(s):  weakness  PMH: PE 2/22 and h/o DVT, arthritis, breast cancer '96, Depression, HDL, h/o alcohol, HTN, melanoma, OP, PVD, mult SCC, spinal stenosis, CHF, h/o diverticulosis, "choking" episodes,   AC/Heme: Eliquis PTA (LD 9/14 AM) for h/o PE. Hgb 12.7, Plts 147. INR 1.5 (elevated from Eliquis) - 9/14: Transition to IV heparin in prep for surgery.  Goal of Therapy:  aPTT 66-102 seconds Monitor platelets by anticoagulation protocol: Yes   Plan:  Hold Eliquis At 2200 (12 hrs after last Eliquis dose) start IV heparin 3000 unit IV bolus Heparin infusion at 850 units/hr  Will check aPTT (hep level will be falsely elevated from Eliquis) 8 hrs after Heparin starts. Daily HL, aPTT, and CBC   Anapaula Severt S. Alford Highland, PharmD, BCPS Clinical Staff Pharmacist [email or web address] Alford Highland, Devlin Brink Stillinger 04/07/2022,2:50 PM

## 2022-04-07 NOTE — Evaluation (Signed)
Clinical/Bedside Swallow Evaluation Patient Details  Name: MITZY NARON MRN: [id number] Date of Birth: Aug 04, 1931  Today's Date: 04/07/2022 Time: SLP Start Time (ACUTE ONLY): 1140 SLP Stop Time (ACUTE ONLY): 7371 SLP Time Calculation (min) (ACUTE ONLY): 12 min  Past Medical History:  Past Medical History:  Diagnosis Date   Arthritis    "in my back"   Breast cancer ([place]) 1996   left breast cancer    Complication of anesthesia 04/18/2012   "didn't tolerate it today very well; had the shakes and very hard time w/it"   Depression 11/20/2012   DVT (deep venous thrombosis) ([facility])    High cholesterol    History of alcoholism ([place])    7 1/2 years clean   Hypertension    Melanoma ([place]) 01/15/2004   upper right arm   Osteoporosis    Peripheral vascular disease ([place])    hx of ligation    Personal history of radiation therapy 1993   Left Breast Cancer   SCC (squamous cell carcinoma) 01/15/2004   left post upper arm, Right elbow, post left knee   SCC (squamous cell carcinoma) 05/21/2013   right forearm, right jawline, left cheek   SCC (squamous cell carcinoma) 08/24/2016   right shin,right thigh   SCC (squamous cell carcinoma) 11/01/2016   SCC (squamous cell carcinoma) 02/02/2018   left outer arm,right outer thigh   SCC (squamous cell carcinoma) 12/13/2018   right jawline   Spinal stenosis    Past Surgical History:  Past Surgical History:  Procedure Laterality Date   BREAST LUMPECTOMY Left 1996   HARDWARE REMOVAL Left 09/03/2020   Procedure: HARDWARE REMOVAL;  Surgeon: Altamese Spencer, MD;  Location: [facility];  Service: Orthopedics;  Laterality: Left;   I & D EXTREMITY  04/17/2012   Procedure: IRRIGATION AND DEBRIDEMENT EXTREMITY;  Surgeon: Rudean Haskell, MD;  Location: [facility];  Service: Orthopedics;  Laterality: Right;   INTRAMEDULLARY (IM) NAIL INTERTROCHANTERIC Left 09/03/2020   Procedure: INTRAMEDULLARY (IM) NAIL INTERTROCHANTRIC;  Surgeon: Altamese [place], MD;  Location: [facility];   Service: Orthopedics;  Laterality: Left;   KNEE ARTHROSCOPY  04/17/2012   Procedure: ARTHROSCOPY KNEE;  Surgeon: Rudean Haskell, MD;  Location: [facility];  Service: Orthopedics;  Laterality: Right;   KYPHOPLASTY  2009, 2013   thorasic, lumbar   MENISECTOMY Right 04/11/2012   ORIF FEMUR FRACTURE Left 11/01/2019   Procedure: OPEN REDUCTION INTERNAL FIXATION (ORIF) DISTAL FEMUR FRACTURE;  Surgeon: Altamese [place], MD;  Location: [facility];  Service: Orthopedics;  Laterality: Left;   ORIF PERIPROSTHETIC FRACTURE Right 05/03/2018   Procedure: RIGHT RETROGRADE FEMORAL NAIL;  Surgeon: Rod Can, MD;  Location: [facility];  Service: Orthopedics;  Laterality: Right;   POSTERIOR LAMINECTOMY / DECOMPRESSION LUMBAR SPINE  1980's   rotator cuff surgery  Right    TONSILLECTOMY AND ADENOIDECTOMY     "I was a child"   TOTAL KNEE ARTHROPLASTY Right 10/29/2012   Procedure: RIGHT TOTAL KNEE ARTHROPLASTY;  Surgeon: Gearlean Alf, MD;  Location: [facility];  Service: Orthopedics;  Laterality: Right;   VEIN LIGATION     HPI:  Pt is an 88 year old female seen in ED due to confusion. No family at bedside, pt unable to give accurate history about current admission. Infection source still unknown, potentially respiratory though CXR clear as MD noted crackles and potential history of coughing with PO.    Assessment / Plan / Recommendation  Clinical Impression  Pt demonstrates one cough over several trials, when taking  a large consecutive sip. Pt self reports she sometimes gets choked with straws if she drinks too quickly. RN confirms that pt seems to drink too much at once when first taking sips with him, but then slowed down and did better. Pt reports she has been advised not to use straws and prefers not to do so. When given a cup to drink from she took single appropriate sips without impairment. Pt would likely benefit from careful supervision with PO, upright positioning, no straws, small sips. But does not appear significantly  impaired in swallowing ability. Recommend pt initiate a regular diet and thin liquids, no straws with precautions (sign posted). Will f/u for tolerance with a meal if admitted. SLP Visit Diagnosis: Dysphagia, oropharyngeal phase (R13.12)    Aspiration Risk  Mild aspiration risk    Diet Recommendation Regular;Thin liquid   Liquid Administration via: No straw;Cup Medication Administration: Whole meds with liquid Supervision: Patient able to self feed Compensations: Slow rate;Small sips/bites Postural Changes: Seated upright at 90 degrees;Remain upright for at least 30 minutes after po intake    Other  Recommendations Oral Care Recommendations: Oral care BID    Recommendations for follow up therapy are one component of a multi-disciplinary discharge planning process, led by the attending physician.  Recommendations may be updated based on patient status, additional functional criteria and insurance authorization.  Follow up Recommendations No SLP follow up      Assistance Recommended at Discharge    Functional Status Assessment Patient has had a recent decline in their functional status and demonstrates the ability to make significant improvements in function in a reasonable and predictable amount of time.  Frequency and Duration min 2x/week  1 week       Prognosis Prognosis for Safe Diet Advancement: Good Barriers to Reach Goals: Cognitive deficits      Swallow Study   General HPI: Pt is an 88 year old female seen in ED due to confusion. No fmily at bedsdie, pt unable to give accurate history. Infections source still unknown, potentiall respiratory though CXR clear as MD noted crackes and potential history of coughing with PO. Type of Study: Bedside Swallow Evaluation Previous Swallow Assessment: BSE 2022 WNL Diet Prior to this Study: NPO Temperature Spikes Noted: No Respiratory Status: Room air History of Recent Intubation: No Behavior/Cognition: Alert;Cooperative;Pleasant  mood Oral Cavity Assessment: Within Functional Limits Oral Care Completed by SLP: No Oral Cavity - Dentition: Adequate natural dentition Vision: Functional for self-feeding Self-Feeding Abilities: Able to feed self Patient Positioning: Upright in bed Baseline Vocal Quality: Normal Volitional Cough: Strong Volitional Swallow: Able to elicit    Oral/Motor/Sensory Function Overall Oral Motor/Sensory Function: Within functional limits   Ice Chips     Thin Liquid Thin Liquid: Impaired Presentation: Cup;Straw Pharyngeal  Phase Impairments: Cough - Immediate    Nectar Thick Nectar Thick Liquid: Not tested   Honey Thick Honey Thick Liquid: Not tested   Puree Puree: Within functional limits   Solid     Solid: Within functional limits      Travonta Gill, Katherene Ponto 04/07/2022,12:03 PM

## 2022-04-07 NOTE — ED Notes (Signed)
PA at [facility], delayed transport to floor

## 2022-04-07 NOTE — ED Notes (Signed)
MD into room.

## 2022-04-08 ENCOUNTER — Inpatient Hospital Stay (HOSPITAL_COMMUNITY): Payer: Medicare Other

## 2022-04-08 DIAGNOSIS — Z7189 Other specified counseling: Secondary | ICD-10-CM | POA: Diagnosis not present

## 2022-04-08 DIAGNOSIS — A419 Sepsis, unspecified organism: Secondary | ICD-10-CM | POA: Diagnosis not present

## 2022-04-08 DIAGNOSIS — J189 Pneumonia, unspecified organism: Secondary | ICD-10-CM | POA: Diagnosis not present

## 2022-04-08 DIAGNOSIS — Z0189 Encounter for other specified special examinations: Secondary | ICD-10-CM

## 2022-04-08 DIAGNOSIS — Z515 Encounter for palliative care: Secondary | ICD-10-CM | POA: Diagnosis not present

## 2022-04-08 DIAGNOSIS — R6520 Severe sepsis without septic shock: Secondary | ICD-10-CM | POA: Diagnosis not present

## 2022-04-08 DIAGNOSIS — Z86711 Personal history of pulmonary embolism: Secondary | ICD-10-CM | POA: Diagnosis not present

## 2022-04-08 LAB — MAGNESIUM: Magnesium: 1.7 mg/dL (ref 1.7–2.4)

## 2022-04-08 LAB — HEPARIN LEVEL (UNFRACTIONATED): Heparin Unfractionated: >1.1 IU/mL — ABNORMAL HIGH (ref 0.30–0.70)

## 2022-04-08 LAB — LEGIONELLA PNEUMOPHILA SEROGP 1 UR AG: L. pneumophila Serogp 1 Ur Ag: NEGATIVE

## 2022-04-08 LAB — ECHOCARDIOGRAM COMPLETE
AV Vena cont: 0.2 cm
Area-P 1/2: 3.6 cm2
Calc EF: 62.6 %
Height: 66 in
P 1/2 time: 332 msec
S' Lateral: 2.5 cm
Single Plane A2C EF: 58.2 %
Single Plane A4C EF: 62.6 %
Weight: 1904.77 oz

## 2022-04-08 LAB — CBC
HCT: 34 % — ABNORMAL LOW (ref 36.0–46.0)
Hemoglobin: 11.4 g/dL — ABNORMAL LOW (ref 12.0–15.0)
MCH: 31.1 pg (ref 26.0–34.0)
MCHC: 33.5 g/dL (ref 30.0–36.0)
MCV: 92.6 fL (ref 80.0–100.0)
Platelets: 130 10*3/uL — ABNORMAL LOW (ref 150–400)
RBC: 3.67 MIL/uL — ABNORMAL LOW (ref 3.87–5.11)
RDW: 13.6 % (ref 11.5–15.5)
WBC: 7.3 10*3/uL (ref 4.0–10.5)
nRBC: 0 % (ref 0.0–0.2)

## 2022-04-08 LAB — APTT
aPTT: 123 seconds — ABNORMAL HIGH (ref 24–36)
aPTT: 78 seconds — ABNORMAL HIGH (ref 24–36)

## 2022-04-08 MED ORDER — HEPARIN (PORCINE) 25000 UT/250ML-% IV SOLN
700.0000 [IU]/h | INTRAVENOUS | Status: AC
  Administered 2022-04-08 – 2022-04-09 (×2): 700 [IU]/h via INTRAVENOUS
  Filled 2022-04-08: qty 250

## 2022-04-08 MED ORDER — IOHEXOL 9 MG/ML PO SOLN
500.0000 mL | ORAL | Status: AC
  Administered 2022-04-08: 500 mL via ORAL

## 2022-04-08 MED ORDER — MAGNESIUM SULFATE 2 GM/50ML IV SOLN
2.0000 g | Freq: Once | INTRAVENOUS | Status: AC
  Administered 2022-04-08: 2 g via INTRAVENOUS
  Filled 2022-04-08: qty 50

## 2022-04-08 MED ORDER — IOHEXOL 9 MG/ML PO SOLN
ORAL | Status: AC
  Administered 2022-04-08: 500 mL
  Filled 2022-04-08: qty 1000

## 2022-04-08 NOTE — Progress Notes (Signed)
[facility] Progress Note     Subjective: CC:  Pleasantly confused. Oriented to person and [facility]. Denies abdominal pain today - states it is much better  Objective: Vital signs in last 24 hours: Temp:  [97.6 F (36.4 C)-98.7 F (37.1 C)] 97.6 F (36.4 C) (09/15 0253) Pulse Rate:  [62-71] 71 (09/15 0253) Resp:  [18-29] 20 (09/15 0253) BP: (97-138)/(43-81) 137/73 (09/15 0253) SpO2:  [91 %-100 %] 92 % (09/15 0253)    Intake/Output from previous day: 09/14 0701 - 09/15 0700 In: 2100 [I.V.:2000; IV Piggyback:100] Out: 2725 [Urine:2725] Intake/Output this shift: No intake/output data recorded.  PE: Gen:  Alert, NAD, pleasant Card:  Regular rate and rhythm, no lower extremity edema Pulm:  Normal effort ORA Abd: Soft, mild distention, non-tender, +BS Skin: warm and dry, no rashes  Psych: A&Ox2  Lab Results:  Recent Labs    04/07/22 0354 04/08/22 0517  WBC 11.3* 7.3  HGB 12.7 11.4*  HCT 38.1 34.0*  PLT 147* 130*   BMET Recent Labs    04/06/22 1629 04/07/22 0523  NA 138 136  K 3.8 3.7  CL 105 103  CO2 25 25  GLUCOSE 106* 96  BUN 19 18  CREATININE 0.65 0.66  CALCIUM 9.0 9.0   PT/INR Recent Labs    04/06/22 1629  LABPROT 17.5*  INR 1.5*   CMP     Component Value Date/Time   NA 136 04/07/2022 0523   NA 138 09/30/2020 0000   K 3.7 04/07/2022 0523   CL 103 04/07/2022 0523   CO2 25 04/07/2022 0523   GLUCOSE 96 04/07/2022 0523   BUN 18 04/07/2022 0523   BUN 31 (A) 09/15/2020 0000   CREATININE 0.66 04/07/2022 0523   CREATININE 0.75 06/13/2012 1057   CALCIUM 9.0 04/07/2022 0523   PROT 6.4 (L) 04/07/2022 0523   ALBUMIN 3.2 (L) 04/07/2022 0523   AST 23 04/07/2022 0523   ALT 20 04/07/2022 0523   ALKPHOS 66 04/07/2022 0523   BILITOT 1.6 (H) 04/07/2022 0523   GFRNONAA >60 04/07/2022 0523   GFRNONAA 88 05/23/2012 1337   GFRAA >90 09/30/2020 0000   GFRAA >89 05/23/2012 1337   Lipase  No results found for:  "LIPASE"     Studies/Results: CT ABDOMEN PELVIS W CONTRAST  Result Date: 04/07/2022 CLINICAL DATA:  Left lower quadrant pain. EXAM: CT ABDOMEN AND PELVIS WITH CONTRAST TECHNIQUE: Multidetector CT imaging of the abdomen and pelvis was performed using the standard protocol following bolus administration of intravenous contrast. RADIATION DOSE REDUCTION: This exam was performed according to the departmental dose-optimization program which includes automated exposure control, adjustment of the mA and/or kV according to patient size and/or use of iterative reconstruction technique. CONTRAST:  120m OMNIPAQUE IOHEXOL 300 MG/ML  SOLN COMPARISON:  None Available. FINDINGS: Lower chest: Large hiatal hernia with 75-100% of the stomach contained in the chest. Bibasilar collapse/consolidation with small bilateral pleural effusions. Hepatobiliary: No suspicious focal abnormality within the liver parenchyma. There is no evidence for gallstones, gallbladder wall thickening, or pericholecystic fluid. No intrahepatic or extrahepatic biliary dilation. Pancreas: No focal mass lesion. No dilatation of the main duct. No intraparenchymal cyst. No peripancreatic edema. Spleen: No splenomegaly. No focal mass lesion. Adrenals/Urinary Tract: No adrenal nodule or mass. Cortical scarring noted upper pole right kidney. Kidneys otherwise unremarkable. No evidence for hydroureter. Bladder is markedly distended measuring 19.1 x 16.4 x 12.9 cm. Stomach/Bowel: Large hiatal hernia, as above. Duodenum is normally positioned as is the ligament of  Treitz. Small bowel loops in the left abdomen show ill-defined wall thickening with perienteric edema/inflammation. Two discrete collections of gas and debris are identified. One of these is visible on axial 36/2 and coronal 60/5 measuring about 3 cm in size. A second bilobed collection of gas is identified more inferiorly (image 40/2 and coronal 54/5). This collection appears more thin walled and  contains only gas without associated fluid debris. Numerous scattered small bowel diverticuli are identified. Terminal ileum not well visualized. The appendix is best seen on coronal images and is unremarkable. No gross colonic mass. No colonic wall thickening. Diverticuli are seen scattered along the entire length of the colon without CT findings of diverticulitis. Vascular/Lymphatic: There is moderate atherosclerotic calcification of the abdominal aorta without aneurysm. There is no gastrohepatic or hepatoduodenal ligament lymphadenopathy. No retroperitoneal or mesenteric lymphadenopathy. No pelvic sidewall lymphadenopathy. Reproductive: The uterus is unremarkable.  There is no adnexal mass. Other: No intraperitoneal free fluid. Musculoskeletal: Bones are diffusely demineralized. Fixation hardware noted in the left femoral neck and proximal right femur. Multilevel thoracolumbar compression deformity evident with prior vertebral augmentation at L1, L2, and L4. IMPRESSION: 1. Small bowel loops in the left abdomen show ill-defined wall thickening with associated edema/inflammation. 3 cm collection of gas and debris identified in the left abdominal mesentery. Given that there is other diverticular disease in the small bowel, this is probably an enlarged, thick-walled inflamed diverticulum. However, small bowel perforation contained in the adjacent mesentery could have a similar appearance and is not excluded on this study. 2. A second bilobed large collection of gas is identified more inferiorly in the left small bowel mesentery. This collection has no perceptible wall and contains only gas without associated fluid debris. This is felt to represent a patulous small bowel diverticulum although extraluminal gas collection is not excluded. 3. Marked distention of the urinary bladder. Correlation for bladder dysmotility or outlet obstruction recommended. 4. Large hiatal hernia with 75-100% of the stomach contained in the  chest. 5. Bibasilar collapse/consolidation with small bilateral pleural effusions. 6. Diffuse colonic diverticulosis without diverticulitis. 7. Multilevel thoracolumbar compression deformity with prior vertebral augmentation at L1, L2, and L4. 8. Aortic Atherosclerosis (ICD10-I70.0). Electronically Signed   By: Misty Stanley M.D.   On: 04/07/2022 08:07   DG Chest Port 1 View  Result Date: 04/06/2022 CLINICAL DATA:  Possible sepsis, weakness, confusion EXAM: PORTABLE CHEST 1 VIEW COMPARISON:  Previous studies including the chest radiograph done on 09/04/2020 FINDINGS: Transverse diameter of heart is increased. There is soft tissue fullness in the retrocardiac region suggesting possible fixed hiatal hernia. There are no signs of alveolar pulmonary edema or focal pulmonary consolidation. There is no pleural effusion or pneumothorax. Surgical clips are seen in left axilla. Deformities are noted in multiple left ribs with no significant interval change suggesting old fractures. Degenerative changes are noted in both shoulders, more so on the right side. IMPRESSION: Cardiomegaly. There are no signs of pulmonary edema or focal pulmonary consolidation. Possible fixed hiatal hernia. Electronically Signed   By: Elmer Picker M.D.   On: 04/06/2022 16:40    Anti-infectives: Anti-infectives (From admission, onward)    Start     Dose/Rate Route Frequency Ordered Stop   04/07/22 2000  cefTRIAXone (ROCEPHIN) 2 g in sodium chloride 0.9 % 100 mL IVPB        2 g 200 mL/hr over 30 Minutes Intravenous Every 24 hours 04/06/22 2105 04/11/22 1959   04/07/22 1030  metroNIDAZOLE (FLAGYL) IVPB 500 mg  500 mg 100 mL/hr over 60 Minutes Intravenous Every 12 hours 04/07/22 1007     04/07/22 1000  doxycycline (VIBRA-TABS) tablet 100 mg  Status:  Discontinued        100 mg Oral Every 12 hours 04/06/22 2105 04/07/22 1231   04/06/22 1945  doxycycline (VIBRA-TABS) tablet 100 mg        100 mg Oral  Once 04/06/22 1930  04/06/22 2014   04/06/22 1930  cefTRIAXone (ROCEPHIN) 1 g in sodium chloride 0.9 % 100 mL IVPB        1 g 200 mL/hr over 30 Minutes Intravenous  Once 04/06/22 1927 04/06/22 2302        Assessment/Plan  Small bowel mesenteric air/fluid collections, possible small bowel diverticulitis vs small bowel perforation   - afebrile, VSS, WBC 7.3 - CT w/ gas fluid collection of small bowel mesentery concerning for perforated small bowel diverticulitis.  - cardiology saw 9/14 and pts perioperative risk of major cardiac event is high (11%) but not prohibitive for surgery. She appears compensated. Repeat 2D echo pending.  - palliative care consult pending to further delineate GOC  - The standard of care of perforated small bowel diverticulitis is surgery, however I see no emergent indications for surgery at present. The patient is afebrile, hemodynamically stable, and non-tender on exam. Continue bowel rest and IV abx for goals of care discussions today and we will follow with you.   FEN - NPO/IVFs VTE - hold eliquis, ok for heparin gtt if needed ID - Rocephin/Flagyl Foley - in place for retention    PE on eliquis - hold eliquis H/O breast cancer HTN PVD H/o SCC of multiple areas CHF - last EF 45-50% in 2022   LOS: 1 day   I reviewed nursing notes, Consultant cardiology notes, hospitalist notes, last 24 h vitals and pain scores, last 48 h intake and output, last 24 h labs and trends, and last 24 h imaging results.    Obie Dredge, PA-C [facility] Please see Amion for pager number during day hours 7:00am-4:30pm

## 2022-04-08 NOTE — Progress Notes (Addendum)
Pt trying to get out  of her bed frequently saying she needs to go home.. Telesitter directing pt not to get up, but pt is trying to get up. This Rn has been in the room multiple times to orient her and directing her to be on bed. immediate needs addressed. Call bell placed within reach.

## 2022-04-08 NOTE — Progress Notes (Signed)
Patient with active NPO order. Oral contrast ordered for CT scan. Paged on call surgeon. Per Dr Harlow Asa okay to patient to have oral contrast. Will continue to monitor.

## 2022-04-08 NOTE — Consult Note (Signed)
Consultation Note Date: 04/08/2022   Patient Name: [name]  DOB: 12-14-1931  MRN: [id number]  Age / Sex: 88 y.o., female  PCP: Burnard Bunting, MD Referring Physician: Geradine Girt, DO  Reason for Consultation: Establishing goals of care  HPI/Patient Profile: 88 y.o. female  with past medical history of PE on Eliquis, CHF, HTN, PVD, left breast cancer s/p radiation admitted on 04/06/2022 with confusion, lethargy, weakness initially thought related to sepsis pneumonia and noted AV black Mobitz 1. CT abd reveals small bowel inflammation with air and debris in small bowel mesentery and small bowel perforation could not be excluded. Patient and family have elected to not pursue surgery at this time. Cardiology notes cardiac risk is high but no prohibitive.   Clinical Assessment and Goals of Care: I met today after reviewing records and diagnostics. No family or visitors present at bedside. [name] is lying in bed with safety sitter at bedside. She is pleasantly confused. Trace is able to have mostly appropriate responses and greetings and knows she is in [place], [place] but unaware of her condition or that she is in the hospital.   I called and spoke with husband, [name]. James and I discussed [name]'s condition and that there is no need for emergent surgery. [name] confirms that she has had memory issues at home but this has not been formally evaluated or treated. We discussed bowel obstruction and perforation. [name] expresses concerns for jumping straight into surgical intervention at his wife's age and illness. We discussed risks to surgery with cardiac risk, cognitive risk with anesthesia, as well as complicated recovery and concern for quality of life. After discussion [name] is less interested in pursuing surgical intervention even in acute decompensation but rather continue with conservative measures and hope for  the best. We discussed goals for comfort and minimizing suffering if she has acute decline. We discussed wishes for resuscitation and after discussing he elects DNR status and this seems consistent with Michale's expressed wishes to [name] over the years. [name] also reports that this seems consistent with her Living Will - I asked that he bring so we can upload a copy of her directive into the chart.   All questions/concerns addressed. Emotional support provided. Updated medical team and nursing.   Primary Decision Maker NEXT OF KIN husband of 78 years [name]    SUMMARY OF RECOMMENDATIONS   - DNR decided - No desire for surgical intervention - Hopeful for improvement with conservative measures - Time for outcomes  Code Status/Advance Care Planning: DNR   Symptom Management:  Per attending, surgery.   Prognosis:  To be determined.   Discharge Planning: To Be Determined      Primary Diagnoses: Present on Admission:  CAP (community acquired pneumonia)  Sepsis ([place])  Acute encephalopathy  Essential hypertension, benign  Chronic systolic CHF (congestive heart failure) (HCC)  History of pulmonary embolism  AV block, Mobitz 1   I have reviewed the medical record, interviewed the patient and family, and examined the patient. The following aspects  are pertinent.  Past Medical History:  Diagnosis Date   Arthritis    "in my back"   Breast cancer ([place]) 1996   left breast cancer    Complication of anesthesia 04/18/2012   "didn't tolerate it today very well; had the shakes and very hard time w/it"   Depression 11/20/2012   DVT (deep venous thrombosis) (HCC)    High cholesterol    History of alcoholism ([place])    7 1/2 years clean   Hypertension    Melanoma ([place]) 01/15/2004   upper right arm   Osteoporosis    Peripheral vascular disease (HCC)    hx of ligation    Personal history of radiation therapy 1993   Left Breast Cancer   SCC (squamous cell carcinoma) 01/15/2004   left  post upper arm, Right elbow, post left knee   SCC (squamous cell carcinoma) 05/21/2013   right forearm, right jawline, left cheek   SCC (squamous cell carcinoma) 08/24/2016   right shin,right thigh   SCC (squamous cell carcinoma) 11/01/2016   SCC (squamous cell carcinoma) 02/02/2018   left outer arm,right outer thigh   SCC (squamous cell carcinoma) 12/13/2018   right jawline   Spinal stenosis    Social History   Socioeconomic History   Marital status: Married    Spouse name: Not on file   Number of children: 2   Years of education: Not on file   Highest education level: Not on file  Occupational History   Occupation: retired  Tobacco Use   Smoking status: Never   Smokeless tobacco: Never  Vaping Use   Vaping Use: Never used  Substance and Sexual Activity   Alcohol use: No   Drug use: No   Sexual activity: Not on file  Other Topics Concern   Not on file  Social History Narrative   Not on file   Social Determinants of Health   Financial Resource Strain: Not on file  Food Insecurity: No Food Insecurity (04/06/2022)   Hunger Vital Sign    Worried About Running Out of Food in the Last Year: Never true    Ran Out of Food in the Last Year: Never true  Transportation Needs: No Transportation Needs (04/06/2022)   PRAPARE - Hydrologist (Medical): No    Lack of Transportation (Non-Medical): No  Physical Activity: Not on file  Stress: Not on file  Social Connections: Not on file   Family History  Problem Relation Age of Onset   Kidney failure Mother    Anuerysm Father        AAA   Scheduled Meds:  calcium-vitamin D  1 tablet Oral Daily   Chlorhexidine Gluconate Cloth  6 each Topical Daily   escitalopram  20 mg Oral Daily   gabapentin  300 mg Oral QID   pantoprazole (PROTONIX) IV  40 mg Intravenous Q24H   tamsulosin  0.4 mg Oral Daily   Continuous Infusions:  cefTRIAXone (ROCEPHIN)  IV 2 g (04/07/22 2030)   heparin 700 Units/hr  (04/08/22 0851)   metronidazole 500 mg (04/07/22 2221)   PRN Meds:.morphine injection, senna-docusate Allergies  Allergen Reactions   Oysters [Shellfish Allergy] Nausea And Vomiting   Codeine Nausea And Vomiting   Penicillins Rash   Review of Systems  Unable to perform ROS: Dementia    Physical Exam Vitals and nursing note reviewed.  Constitutional:      General: She is not in acute distress.    Appearance: She  is ill-appearing.  Cardiovascular:     Rate and Rhythm: Normal rate.  Pulmonary:     Effort: No tachypnea, accessory muscle usage or respiratory distress.  Abdominal:     General: Abdomen is flat.     Tenderness: There is no abdominal tenderness.  Neurological:     Mental Status: She is alert.     Comments: Oriented to person     Vital Signs: BP 137/73 (BP Location: Left Arm)   Pulse 71   Temp 97.6 F (36.4 C)   Resp 20   Ht _0  (1.676 m)   Wt 54 kg   SpO2 92%   BMI 19.21 kg/m  Pain Scale: 0-10   Pain Score: 0-No pain   SpO2: SpO2: 92 % O2 Device:SpO2: 92 % O2 Flow Rate: .   IO: Intake/output summary:  Intake/Output Summary (Last 24 hours) at 04/08/2022 0918 Last data filed at 04/07/2022 1524 Gross per 24 hour  Intake 2100 ml  Output 725 ml  Net 1375 ml    LBM:   Baseline Weight: Weight: 54 kg Most recent weight: Weight: 54 kg     Palliative Assessment/Data:     Time In: 1545 Time Total: 75 min Greater than 50%  of this time was spent counseling and coordinating care related to the above assessment and plan.  Signed by: Vinie Sill, NP Palliative Medicine Team Pager # [phone] (M-F 8a-5p) Team Phone # [phone] (Nights/Weekends)

## 2022-04-08 NOTE — Progress Notes (Signed)
ANTICOAGULATION CONSULT NOTE - follow up  Pharmacy Consult for Heparin (apixaban PTA) Indication:  h/o DVT, PE  Allergies  Allergen Reactions   Oysters [Shellfish Allergy] Nausea And Vomiting   Codeine Nausea And Vomiting   Penicillins Rash    Patient Measurements: Height: '5\' 6"'$  (167.6 cm) Weight: 54 kg (119 lb 0.8 oz) IBW/kg (Calculated) : 59.3 Heparin Dosing Weight: 54 kg  Vital Signs:    Labs: Recent Labs    04/06/22 1629 04/07/22 0354 04/07/22 0523 04/08/22 0517 04/08/22 1631  HGB 12.5 12.7  --  11.4*  --   HCT 38.3 38.1  --  34.0*  --   PLT 168 147*  --  130*  --   APTT 40*  --   --  123* 78*  LABPROT 17.5*  --   --   --   --   INR 1.5*  --   --   --   --   HEPARINUNFRC  --   --   --  >1.10*  --   CREATININE 0.65  --  0.66  --   --      Estimated Creatinine Clearance: 42.2 mL/min (by C-G formula based on SCr of 0.66 mg/dL).   Medical History: Past Medical History:  Diagnosis Date   Arthritis    "in my back"   Breast cancer ([place]) 1996   left breast cancer    Complication of anesthesia 04/18/2012   "didn't tolerate it today very well; had the shakes and very hard time w/it"   Depression 11/20/2012   DVT (deep venous thrombosis) (HCC)    High cholesterol    History of alcoholism ([place])    7 1/2 years clean   Hypertension    Melanoma ([place]) 01/15/2004   upper right arm   Osteoporosis    Peripheral vascular disease (HCC)    hx of ligation    Personal history of radiation therapy 1993   Left Breast Cancer   SCC (squamous cell carcinoma) 01/15/2004   left post upper arm, Right elbow, post left knee   SCC (squamous cell carcinoma) 05/21/2013   right forearm, right jawline, left cheek   SCC (squamous cell carcinoma) 08/24/2016   right shin,right thigh   SCC (squamous cell carcinoma) 11/01/2016   SCC (squamous cell carcinoma) 02/02/2018   left outer arm,right outer thigh   SCC (squamous cell carcinoma) 12/13/2018   right jawline   Spinal stenosis     Assessment: Active Problem(s): weakness  PMH: PE 2/22 and h/o DVT, arthritis, breast cancer '96, Depression, HDL, h/o alcohol, HTN, melanoma, OP, PVD, mult SCC, spinal stenosis, CHF, h/o diverticulosis, "choking" episodes   AC/Heme: Eliquis PTA (LD 9/14 AM) for h/o PE. Hgb 12.7, Plts 147. INR 1.5 (elevated from Eliquis) - 9/14: Transition to IV heparin in prep for possible surgery.  aPTT 78 therapeutic on heparin infusion at 700 units/hr CBC: Hgb 11.4, down slightly. Plts 130, down - monitor closely Heparin level elevated as expected given apixaban Per RN, patient still having some bleeding at the IV site but similar to earlier today  Goal of Therapy:  aPTT 66-102 seconds Monitor platelets by anticoagulation protocol: Yes   Plan:  Continue heparin infusion at 700 units/hr Recheck aPTT in 8 hours to confirm Daily HL, aPTT, and CBC   Tawnya Crook, PharmD, BCPS Clinical Pharmacist 04/08/2022 5:37 PM

## 2022-04-08 NOTE — Evaluation (Signed)
Physical Therapy Evaluation Patient Details Name: [name] MRN: [id number] DOB: Jun 05, 1932 Today's Date: 04/08/2022  History of Present Illness  Pt is an 88 y.o. female presented  to ED with onset of confusion and lethargy at home. She was found to be febrile , hypotensive ,  reports persistent cough and abdominal pain. PMH significant for PE on eliquis, CHF, HTN. CT abd and pelvis show Small bowel loops in the left abdomen show ill-defined wall thickening with associated edema/inflammation. 3 cm collection of gas and debris identified in the left abdominal mesentery. . A second bilobed large collection of gas is identified more inferiorly in the left small bowel mesentery. This collection has no perceptible wall and contains only gas without associated fluid debris. Small bowel perforation cannot be excluded. She was also found to have stomach in a large hiatal hernia. Patient improved with antibiotics.    Clinical Impression  [name] is 89 y.o. female admitted with above HPI and diagnosis. Patient is currently limited by functional impairments below (see PT problem list). Patient lives with her husband and per chart was mobilizing at home with walker, pt is unreliable historian and unable to provide current baseline. During eval pt required min assist for bed mobility and gait and mod assist for transfers depending on surface. Patient will benefit from continued skilled PT interventions to address impairments and progress independence with mobility, recommending SNF for ST rehab. Acute PT will follow and progress as able.      Recommendations for follow up therapy are one component of a multi-disciplinary discharge planning process, led by the attending physician.  Recommendations may be updated based on patient status, additional functional criteria and insurance authorization.  Follow Up Recommendations Skilled nursing-short term rehab (<3 hours/day) Can patient physically be  transported by private vehicle: Yes    Assistance Recommended at Discharge Frequent or constant Supervision/Assistance  Patient can return home with the following  A little help with walking and/or transfers;A little help with bathing/dressing/bathroom;Direct supervision/assist for medications management;Assist for transportation;Help with stairs or ramp for entrance;Assistance with cooking/housework    Equipment Recommendations None recommended by PT  Recommendations for Other Services       Functional Status Assessment Patient has had a recent decline in their functional status and demonstrates the ability to make significant improvements in function in a reasonable and predictable amount of time.     Precautions / Restrictions Precautions Precautions: Fall Restrictions Weight Bearing Restrictions: No      Mobility  Bed Mobility Overal bed mobility: Needs Assistance Bed Mobility: Supine to Sit, Sit to Supine     Supine to sit: Min assist, HOB elevated Sit to supine: Min assist   General bed mobility comments: Assist to raise trunk fully and use of bed pad to pivot hips towards EOB. Min assist to raise bil LE's back onto bed.    Transfers Overall transfer level: Needs assistance Equipment used: Rolling walker (2 wheels) Transfers: Sit to/from Stand Sit to Stand: Min assist, Mod assist           General transfer comment: mod assist to power up from EOB and min assist to rise from [facility] Cues for safety to wait for therapist to be ready.    Ambulation/Gait Ambulation/Gait assistance: Min assist Gait Distance (Feet): 150 Feet Assistive device: Rolling walker (2 wheels) Gait Pattern/deviations: Step-through pattern, Decreased step length - right, Decreased step length - left, Decreased stride length, Trunk flexed, Narrow base of support, Shuffle Gait velocity: decr  General Gait Details: Assist to maintain safe proximity to RW as pt tends to walk far behind  walker, pt improved throughout. t easily distracted and attempted to talk to all RN's in the hallway. VSS throughout.  Stairs            Wheelchair Mobility    Modified Rankin (Stroke Patients Only)       Balance                                             Pertinent Vitals/Pain Pain Assessment Pain Assessment: No/denies pain    Home Living Family/patient expects to be discharged to:: Private residence Living Arrangements: Spouse/significant other                 Additional Comments: no family present to provide PLOF, per chart review pt lives wtih husband. 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    Prior Function Prior Level of Function : Patient poor historian/Family not available                     Hand Dominance   Dominant Hand: Right    Extremity/Trunk Assessment   Upper Extremity Assessment Upper Extremity Assessment: Defer to OT evaluation;Generalized weakness    Lower Extremity Assessment Lower Extremity Assessment: Generalized weakness    Cervical / Trunk Assessment Cervical / Trunk Assessment: Kyphotic  Communication   Communication: No difficulties  Cognition Arousal/Alertness: Awake/alert Behavior During Therapy: WFL for tasks assessed/performed Overall Cognitive Status: History of cognitive impairments - at baseline                                 General Comments: dementia at baseline, pleasant. slightly impulsive and poor safety awareness but able to redirect.        General Comments      Exercises     Assessment/Plan    PT Assessment Patient needs continued PT services  PT Problem List Decreased strength;Decreased activity tolerance;Decreased balance;Decreased mobility;Decreased knowledge of use of DME;Decreased cognition;Decreased safety awareness;Decreased knowledge of precautions       PT Treatment Interventions DME instruction;Gait training;Stair training;Functional  mobility training;Therapeutic activities;Balance training;Therapeutic exercise;Patient/family education    PT Goals (Current goals can be found in the Care Plan section)  Acute Rehab PT Goals Patient Stated Goal: none stated PT Goal Formulation: Patient unable to participate in goal setting Time For Goal Achievement: 04/22/22 Potential to Achieve Goals: Good    Frequency Min 3X/week (unsure what family will decide (going home?))     Co-evaluation               AM-PAC PT "6 Clicks" Mobility  Outcome Measure Help needed turning from your back to your side while in a flat bed without using bedrails?: A Little Help needed moving from lying on your back to sitting on the side of a flat bed without using bedrails?: A Little Help needed moving to and from a bed to a chair (including a wheelchair)?: A Lot Help needed standing up from a chair using your arms (e.g., wheelchair or bedside chair)?: A Lot Help needed to walk in hospital room?: A Little Help needed climbing 3-5 steps with a railing? : A Lot 6 Click Score: 15    End of Session Equipment Utilized During Treatment: Gait belt  Activity Tolerance: Patient tolerated treatment well Patient left: in bed;with call bell/phone within reach;with bed alarm set;with nursing/sitter in room Nurse Communication: Mobility status PT Visit Diagnosis: Unsteadiness on feet (R26.81);Muscle weakness (generalized) (M62.81);Difficulty in walking, not elsewhere classified (R26.2)    Time: 1444-1510 PT Time Calculation (min) (ACUTE ONLY): 26 min   Charges:   PT Evaluation $PT Eval Low Complexity: 1 Low PT Treatments $Gait Training: 8-22 mins        Verner Mould, DPT Acute Rehabilitation Services Office [phone] Pager [phone]  04/08/22 4:01 PM

## 2022-04-08 NOTE — Plan of Care (Signed)

## 2022-04-08 NOTE — Progress Notes (Signed)
ANTICOAGULATION CONSULT NOTE - follow up  Pharmacy Consult for Heparin (apixaban PTA) Indication:  h/o DVT, PE  Allergies  Allergen Reactions   Oysters [Shellfish Allergy] Nausea And Vomiting   Codeine Nausea And Vomiting   Penicillins Rash    Patient Measurements: Height: '5\' 6"'$  (167.6 cm) Weight: 54 kg (119 lb 0.8 oz) IBW/kg (Calculated) : 59.3 Heparin Dosing Weight: 54 kg  Vital Signs: Temp: 97.6 F (36.4 C) (09/15 0253) BP: 137/73 (09/15 0253) Pulse Rate: 71 (09/15 0253)  Labs: Recent Labs    04/06/22 1629 04/07/22 0354 04/07/22 0523 04/08/22 0517  HGB 12.5 12.7  --  11.4*  HCT 38.3 38.1  --  34.0*  PLT 168 147*  --  130*  APTT 40*  --   --  123*  LABPROT 17.5*  --   --   --   INR 1.5*  --   --   --   HEPARINUNFRC  --   --   --  >1.10*  CREATININE 0.65  --  0.66  --      Estimated Creatinine Clearance: 42.2 mL/min (by C-G formula based on SCr of 0.66 mg/dL).   Medical History: Past Medical History:  Diagnosis Date   Arthritis    "in my back"   Breast cancer ([place]) 1996   left breast cancer    Complication of anesthesia 04/18/2012   "didn't tolerate it today very well; had the shakes and very hard time w/it"   Depression 11/20/2012   DVT (deep venous thrombosis) (HCC)    High cholesterol    History of alcoholism ([place])    7 1/2 years clean   Hypertension    Melanoma ([place]) 01/15/2004   upper right arm   Osteoporosis    Peripheral vascular disease ([facility])    hx of ligation    Personal history of radiation therapy 1993   Left Breast Cancer   SCC (squamous cell carcinoma) 01/15/2004   left post upper arm, Right elbow, post left knee   SCC (squamous cell carcinoma) 05/21/2013   right forearm, right jawline, left cheek   SCC (squamous cell carcinoma) 08/24/2016   right shin,right thigh   SCC (squamous cell carcinoma) 11/01/2016   SCC (squamous cell carcinoma) 02/02/2018   left outer arm,right outer thigh   SCC (squamous cell carcinoma) 12/13/2018    right jawline   Spinal stenosis    Assessment: Active Problem(s): weakness  PMH: PE 2/22 and h/o DVT, arthritis, breast cancer '96, Depression, HDL, h/o alcohol, HTN, melanoma, OP, PVD, mult SCC, spinal stenosis, CHF, h/o diverticulosis, "choking" episodes,   AC/Heme: Eliquis PTA (LD 9/14 AM) for h/o PE. Hgb 12.7, Plts 147. INR 1.5 (elevated from Eliquis) - 9/14: Transition to IV heparin in prep for possible surgery.  aPTT SUPRAtherapeutic on current IV heparin rate of 850 units/hr CBC: Hgb 11.4, down slightly. Plts 130, down - monitor closely Heparin level supratherapeutic as expected given apixaban Per RN, slightly bleeding at IV site but otherwise no issues  Goal of Therapy:  aPTT 66-102 seconds Monitor platelets by anticoagulation protocol: Yes   Plan:  Decrease IV heparin from 850 to 700 units/hr Recheck aPTT 8 hours after rate decrease Daily HL, aPTT, and CBC   Adrian Saran, PharmD, BCPS Secure Chat if ?s or find phone# per room assignments 04/08/2022 8:32 AM

## 2022-04-08 NOTE — Progress Notes (Signed)
   04/08/22 1230  Safety Observation   Observer at bedside Yes

## 2022-04-08 NOTE — Plan of Care (Signed)
  Problem: Education: Goal: Knowledge of General Education information will improve Description: Including pain rating scale, medication(s)/side effects and non-pharmacologic comfort measures Outcome: Progressing   Problem: Clinical Measurements: Goal: Ability to maintain clinical measurements within normal limits will improve Outcome: Progressing   Problem: Safety: Goal: Ability to remain free from injury will improve Outcome: Progressing   

## 2022-04-08 NOTE — Progress Notes (Signed)
Echocardiogram 2D Echocardiogram has been performed.  [name] 04/08/2022, 2:53 PM

## 2022-04-08 NOTE — Progress Notes (Signed)
SLP Cancellation Note  Patient Details Name: [name] MRN: [id number] DOB: March 30, 1932   Cancelled treatment:       Reason Eval/Treat Not Completed: Other (comment) (pt currently npo for GI issues, will continue efforts)  Kathleen Lime, MS [facility] SLP [place] Office [phone] Pager [phone]   Macario Golds 04/08/2022, 2:36 PM

## 2022-04-08 NOTE — Progress Notes (Signed)
PROGRESS NOTE    [name]  JHE:[id number] DOB: 05-01-1932 DOA: 04/06/2022 PCP: Burnard Bunting, MD    Chief Complaint  Patient presents with   Weakness    Brief Narrative:   [name] is a 88 y.o. female with medical history significant of PE on eliquis, CHF, HTN, presented  to ED with onset of confusion and lethargy at home. She was found to be febrile , hypotensive ,  reports persistent cough and abdominal pain.  Initial CXR is negative. CT abd and pelvis show Small bowel loops in the left abdomen show ill-defined wall thickening with associated edema/inflammation. 3 cm collection of gas and debris identified in the left abdominal mesentery. . A second bilobed large collection of gas is identified more inferiorly in the left small bowel mesentery. This collection has no perceptible wall and contains only gas without associated fluid debris. Small bowel perforation cannot be excluded. She was also found to have stomach in a large hiatal hernia.  Found to have  urinary retention and a foley catheter was placed.   General surgery consulted for abnormal ct findings, was started on IV antibiotics. On abx she has improved   Assessment & Plan:   Principal Problem:   Sepsis ([place]) Active Problems:   CAP (community acquired pneumonia)   Acute encephalopathy   AV block, [place] 1   Essential hypertension, benign   Chronic systolic CHF (congestive heart failure) ([place])   History of pulmonary embolism   Sepsis:  Febrile 100.5, tachypnea 36/min, hypotensive 97/43 mm of hg, leukocytosis and abnormal CT showing possible small bowel inflammation , with air and debris in the small bowel mesentery.  Started her on IV fluids, IV rocephin and IV flagyl.  General surgery consulted .  Stop the eliquis .  -sepsis picture has resolved  H/o PE on Eliquis on hold as NPO  Will start her on IV heparin.  H/o chronic systolic and diastolic heart failure:  Echo on 08/2020 LVEF is 45 to 50%,  grade 1 diastolic dysfunction.  Acute metabolic encephalopathy:  Appears to have improved, she is more alert and answering all questions appropriately.   Hypertension: hypotension resolved. BP parameters have improved with IV fluids.    Cough: fever:  No Pneumonia on CXR.  Cough probably from large hiatal hernia.  COVID PCR is negative.  Respiratory panel is negative  Urine for strep pneumonia is negative.  Follow blood cultures.   Hypomagnesemia: replaced.    DVT prophylaxis: scd's. Heparin.  Code Status: full code.  Family Communication: called husband for patient Disposition:   Status is: inpt   Level of care: Telemetry Consultants:  General surgery.         Subjective:  Abdominal pain improved   Objective: Vitals:   04/07/22 1435 04/07/22 1846 04/07/22 2116 04/08/22 0253  BP: 131/65 138/74 126/64 137/73  Pulse: 63 66 62 71  Resp: '18 18 18 20  '$ Temp: 98.5 F (36.9 C) 97.9 F (36.6 C) 97.7 F (36.5 C) 97.6 F (36.4 C)  TempSrc: Oral Oral    SpO2: 91% 98% 92% 92%  Weight:      Height:        Intake/Output Summary (Last 24 hours) at 04/08/2022 0952 Last data filed at 04/07/2022 1524 Gross per 24 hour  Intake 2100 ml  Output 725 ml  Net 1375 ml   Filed Weights   04/07/22 0225  Weight: 54 kg    Examination:   General: Appearance:    Thin  female in no acute distress     Lungs:     respirations unlabored  Heart:    Normal heart rate. Normal rhythm. No murmurs, rubs, or gallops.   MS:   All extremities are intact.   Neurologic:   Awake,       Data Reviewed: I have personally reviewed following labs and imaging studies  CBC: Recent Labs  Lab 04/06/22 1629 04/07/22 0354 04/08/22 0517  WBC 12.4* 11.3* 7.3  NEUTROABS 8.9*  --   --   HGB 12.5 12.7 11.4*  HCT 38.3 38.1 34.0*  MCV 93.4 92.7 92.6  PLT 168 147* 130*    Basic Metabolic Panel: Recent Labs  Lab 04/06/22 1629 04/07/22 0523  NA 138 136  K 3.8 3.7  CL 105 103  CO2 25  25  GLUCOSE 106* 96  BUN 19 18  CREATININE 0.65 0.66  CALCIUM 9.0 9.0  MG 1.6*  --     GFR: Estimated Creatinine Clearance: 42.2 mL/min (by C-G formula based on SCr of 0.66 mg/dL).  Liver Function Tests: Recent Labs  Lab 04/06/22 1629 04/07/22 0523  AST 31 23  ALT 24 20  ALKPHOS 63 66  BILITOT 1.5* 1.6*  PROT 6.6 6.4*  ALBUMIN 3.5 3.2*    CBG: No results for input(s): "GLUCAP" in the last 168 hours.   Recent Results (from the past 240 hour(s))  SARS Coronavirus 2 by RT PCR (hospital order, performed in [facility] hospital lab) *cepheid single result test* Anterior Nasal Swab     Status: None   Collection Time: 04/06/22  4:28 PM   Specimen: Anterior Nasal Swab  Result Value Ref Range Status   SARS Coronavirus 2 by RT PCR NEGATIVE NEGATIVE Final    Comment: (NOTE) SARS-CoV-2 target nucleic acids are NOT DETECTED.  The SARS-CoV-2 RNA is generally detectable in upper and lower respiratory specimens during the acute phase of infection. The lowest concentration of SARS-CoV-2 viral copies this assay can detect is 250 copies / mL. A negative result does not preclude SARS-CoV-2 infection and should not be used as the sole basis for treatment or other patient management decisions.  A negative result may occur with improper specimen collection / handling, submission of specimen other than nasopharyngeal swab, presence of viral mutation(s) within the areas targeted by this assay, and inadequate number of viral copies (<250 copies / mL). A negative result must be combined with clinical observations, patient history, and epidemiological information.  Fact Sheet for Patients:   https://www.patel.info/  Fact Sheet for Healthcare Providers: https://hall.com/  This test is not yet approved or  cleared by the [place] FDA and has been authorized for detection and/or diagnosis of SARS-CoV-2 by FDA under an Emergency Use  Authorization (EUA).  This EUA will remain in effect (meaning this test can be used) for the duration of the COVID-19 declaration under Section 564(b)(1) of the Act, 21 U.S.C. section 360bbb-3(b)(1), unless the authorization is terminated or revoked sooner.  Performed at [facility], [place] [address] [place], [place] [address]   Blood Culture (routine x 2)     Status: None (Preliminary result)   Collection Time: 04/06/22  4:30 PM   Specimen: BLOOD  Result Value Ref Range Status   Specimen Description   Final    BLOOD BLOOD RIGHT ARM Performed at [facility] [address], [place],  [address]    Special Requests   Final    BOTTLES DRAWN AEROBIC AND ANAEROBIC  Blood Culture results may not be optimal due to an inadequate volume of blood received in culture bottles Performed at [facility] [address] [place], [place] [address]    Culture   Final    NO GROWTH 2 DAYS Performed at [facility] [address] [place], [place] [address]    Report Status PENDING  Incomplete  Urine Culture     Status: None   Collection Time: 04/06/22  6:20 PM   Specimen: In/Out Cath Urine  Result Value Ref Range Status   Specimen Description   Final    IN/OUT CATH URINE Performed at [facility] [address] [place], [place] [address]    Special Requests   Final    NONE Performed at [facility], [place] [address] [place], [place] [address]    Culture   Final    NO GROWTH Performed at [facility], [place] [address] [place], [place] [address]    Report Status 04/07/2022 FINAL  Final  Respiratory (~20 pathogens) panel by PCR     Status: None   Collection Time: 04/06/22  9:00 PM   Specimen: Nasopharyngeal Swab; Respiratory  Result Value Ref Range Status   Adenovirus NOT DETECTED NOT DETECTED Final   Coronavirus 229E NOT DETECTED NOT DETECTED Final    Comment: (NOTE) The  Coronavirus on the Respiratory Panel, DOES NOT test for the novel  Coronavirus (2019 nCoV)    Coronavirus HKU1 NOT DETECTED NOT DETECTED Final   Coronavirus NL63 NOT DETECTED NOT DETECTED Final   Coronavirus OC43 NOT DETECTED NOT DETECTED Final   Metapneumovirus NOT DETECTED NOT DETECTED Final   Rhinovirus / Enterovirus NOT DETECTED NOT DETECTED Final   Influenza A NOT DETECTED NOT DETECTED Final   Influenza B NOT DETECTED NOT DETECTED Final   Parainfluenza Virus 1 NOT DETECTED NOT DETECTED Final   Parainfluenza Virus 2 NOT DETECTED NOT DETECTED Final   Parainfluenza Virus 3 NOT DETECTED NOT DETECTED Final   Parainfluenza Virus 4 NOT DETECTED NOT DETECTED Final   Respiratory Syncytial Virus NOT DETECTED NOT DETECTED Final   Bordetella pertussis NOT DETECTED NOT DETECTED Final   Bordetella Parapertussis NOT DETECTED NOT DETECTED Final   Chlamydophila pneumoniae NOT DETECTED NOT DETECTED Final   Mycoplasma pneumoniae NOT DETECTED NOT DETECTED Final    Comment: Performed at [facility], [place] [address], [place], [place] [address]         Radiology Studies: CT ABDOMEN PELVIS W CONTRAST  Result Date: 04/07/2022 CLINICAL DATA:  Left lower quadrant pain. EXAM: CT ABDOMEN AND PELVIS WITH CONTRAST TECHNIQUE: Multidetector CT imaging of the abdomen and pelvis was performed using the standard protocol following bolus administration of intravenous contrast. RADIATION DOSE REDUCTION: This exam was performed according to the departmental dose-optimization program which includes automated exposure control, adjustment of the mA and/or kV according to patient size and/or use of iterative reconstruction technique. CONTRAST:  169m OMNIPAQUE IOHEXOL 300 MG/ML  SOLN COMPARISON:  None Available. FINDINGS: Lower chest: Large hiatal hernia with 75-100% of the stomach contained in the chest. Bibasilar collapse/consolidation with small bilateral pleural effusions. Hepatobiliary: No suspicious focal  abnormality within the liver parenchyma. There is no evidence for gallstones, gallbladder wall thickening, or pericholecystic fluid. No intrahepatic or extrahepatic biliary dilation. Pancreas: No focal mass lesion. No dilatation of the main duct. No intraparenchymal cyst. No peripancreatic edema. Spleen: No splenomegaly. No focal mass lesion. Adrenals/Urinary Tract: No adrenal nodule or  mass. Cortical scarring noted upper pole right kidney. Kidneys otherwise unremarkable. No evidence for hydroureter. Bladder is markedly distended measuring 19.1 x 16.4 x 12.9 cm. Stomach/Bowel: Large hiatal hernia, as above. Duodenum is normally positioned as is the ligament of Treitz. Small bowel loops in the left abdomen show ill-defined wall thickening with perienteric edema/inflammation. Two discrete collections of gas and debris are identified. One of these is visible on axial 36/2 and coronal 60/5 measuring about 3 cm in size. A second bilobed collection of gas is identified more inferiorly (image 40/2 and coronal 54/5). This collection appears more thin walled and contains only gas without associated fluid debris. Numerous scattered small bowel diverticuli are identified. Terminal ileum not well visualized. The appendix is best seen on coronal images and is unremarkable. No gross colonic mass. No colonic wall thickening. Diverticuli are seen scattered along the entire length of the colon without CT findings of diverticulitis. Vascular/Lymphatic: There is moderate atherosclerotic calcification of the abdominal aorta without aneurysm. There is no gastrohepatic or hepatoduodenal ligament lymphadenopathy. No retroperitoneal or mesenteric lymphadenopathy. No pelvic sidewall lymphadenopathy. Reproductive: The uterus is unremarkable.  There is no adnexal mass. Other: No intraperitoneal free fluid. Musculoskeletal: Bones are diffusely demineralized. Fixation hardware noted in the left femoral neck and proximal right femur. Multilevel  thoracolumbar compression deformity evident with prior vertebral augmentation at L1, L2, and L4. IMPRESSION: 1. Small bowel loops in the left abdomen show ill-defined wall thickening with associated edema/inflammation. 3 cm collection of gas and debris identified in the left abdominal mesentery. Given that there is other diverticular disease in the small bowel, this is probably an enlarged, thick-walled inflamed diverticulum. However, small bowel perforation contained in the adjacent mesentery could have a similar appearance and is not excluded on this study. 2. A second bilobed large collection of gas is identified more inferiorly in the left small bowel mesentery. This collection has no perceptible wall and contains only gas without associated fluid debris. This is felt to represent a patulous small bowel diverticulum although extraluminal gas collection is not excluded. 3. Marked distention of the urinary bladder. Correlation for bladder dysmotility or outlet obstruction recommended. 4. Large hiatal hernia with 75-100% of the stomach contained in the chest. 5. Bibasilar collapse/consolidation with small bilateral pleural effusions. 6. Diffuse colonic diverticulosis without diverticulitis. 7. Multilevel thoracolumbar compression deformity with prior vertebral augmentation at L1, L2, and L4. 8. Aortic Atherosclerosis (ICD10-I70.0). Electronically Signed   By: Misty Stanley M.D.   On: 04/07/2022 08:07   DG Chest Port 1 View  Result Date: 04/06/2022 CLINICAL DATA:  Possible sepsis, weakness, confusion EXAM: PORTABLE CHEST 1 VIEW COMPARISON:  Previous studies including the chest radiograph done on 09/04/2020 FINDINGS: Transverse diameter of heart is increased. There is soft tissue fullness in the retrocardiac region suggesting possible fixed hiatal hernia. There are no signs of alveolar pulmonary edema or focal pulmonary consolidation. There is no pleural effusion or pneumothorax. Surgical clips are seen in left  axilla. Deformities are noted in multiple left ribs with no significant interval change suggesting old fractures. Degenerative changes are noted in both shoulders, more so on the right side. IMPRESSION: Cardiomegaly. There are no signs of pulmonary edema or focal pulmonary consolidation. Possible fixed hiatal hernia. Electronically Signed   By: Elmer Picker M.D.   On: 04/06/2022 16:40        Scheduled Meds:  calcium-vitamin D  1 tablet Oral Daily   Chlorhexidine Gluconate Cloth  6 each Topical Daily   escitalopram  20  mg Oral Daily   gabapentin  300 mg Oral QID   pantoprazole (PROTONIX) IV  40 mg Intravenous Q24H   tamsulosin  0.4 mg Oral Daily   Continuous Infusions:  cefTRIAXone (ROCEPHIN)  IV 2 g (04/07/22 2030)   heparin 700 Units/hr (04/08/22 0851)   metronidazole 500 mg (04/07/22 2221)     LOS: 1 day    Time spent: 80 minutes    Geradine Girt, DO [facility]   To contact the attending provider between 7A-7P or the covering provider during after hours 7P-7A, please log into the web site [email or web address] and access using universal [place] password for that web site. If you do not have the password, please call the hospital operator.  04/08/2022, 9:52 AM

## 2022-04-09 DIAGNOSIS — R6520 Severe sepsis without septic shock: Secondary | ICD-10-CM | POA: Diagnosis not present

## 2022-04-09 DIAGNOSIS — G9340 Encephalopathy, unspecified: Secondary | ICD-10-CM | POA: Diagnosis not present

## 2022-04-09 DIAGNOSIS — A419 Sepsis, unspecified organism: Secondary | ICD-10-CM | POA: Diagnosis not present

## 2022-04-09 LAB — CBC
HCT: 31.9 % — ABNORMAL LOW (ref 36.0–46.0)
Hemoglobin: 10.7 g/dL — ABNORMAL LOW (ref 12.0–15.0)
MCH: 31 pg (ref 26.0–34.0)
MCHC: 33.5 g/dL (ref 30.0–36.0)
MCV: 92.5 fL (ref 80.0–100.0)
Platelets: 147 10*3/uL — ABNORMAL LOW (ref 150–400)
RBC: 3.45 MIL/uL — ABNORMAL LOW (ref 3.87–5.11)
RDW: 13.2 % (ref 11.5–15.5)
WBC: 6.8 10*3/uL (ref 4.0–10.5)
nRBC: 0 % (ref 0.0–0.2)

## 2022-04-09 LAB — BASIC METABOLIC PANEL
Anion gap: 11 (ref 5–15)
BUN: 18 mg/dL (ref 8–23)
CO2: 21 mmol/L — ABNORMAL LOW (ref 22–32)
Calcium: 8.3 mg/dL — ABNORMAL LOW (ref 8.9–10.3)
Chloride: 104 mmol/L (ref 98–111)
Creatinine, Ser: 0.61 mg/dL (ref 0.44–1.00)
GFR, Estimated: >60 mL/min (ref >60)
Glucose, Bld: 73 mg/dL (ref 70–99)
Potassium: 3.6 mmol/L (ref 3.5–5.1)
Sodium: 136 mmol/L (ref 135–145)

## 2022-04-09 LAB — APTT
aPTT: 95 seconds — ABNORMAL HIGH (ref 24–36)
aPTT: 72 seconds — ABNORMAL HIGH (ref 24–36)

## 2022-04-09 LAB — HEPARIN LEVEL (UNFRACTIONATED): Heparin Unfractionated: >1.1 IU/mL — ABNORMAL HIGH (ref 0.30–0.70)

## 2022-04-09 MED ORDER — ENOXAPARIN SODIUM 60 MG/0.6ML IJ SOSY
1.0000 mg/kg | PREFILLED_SYRINGE | Freq: Two times a day (BID) | INTRAMUSCULAR | Status: DC
  Administered 2022-04-09 – 2022-04-11 (×5): 55 mg via SUBCUTANEOUS
  Filled 2022-04-09 (×5): qty 0.6

## 2022-04-09 NOTE — Hospital Course (Signed)
88 y.o. female with medical history significant of PE on eliquis, CHF, HTN, presented  to ED with onset of confusion and lethargy at home. She was found to be febrile , hypotensive ,  reports persistent cough and abdominal pain.  Initial CXR is negative. CT abd and pelvis show Small bowel loops in the left abdomen show ill-defined wall thickening with associated edema/inflammation. 3 cm collection of gas and debris identified in the left abdominal mesentery. . A second bilobed large collection of gas is identified more inferiorly in the left small bowel mesentery. This collection has no perceptible wall and contains only gas without associated fluid debris. Small bowel perforation cannot be excluded. She was also found to have stomach in a large hiatal hernia. Found to have  urinary retention and a foley catheter was placed. General surgery consulted for abnormal ct findings, was started on IV antibiotics.  Palliative care was consulted-discussed with her husband-patient changed to DNR, no desire for surgical intervention and kept on conservative management. She was being managed conservatively with IV antibiotics, IVF> antibiotics IV fluids discontinued and transitioned to comfort measures by PMT 9/18 with supportive care pain medication/diet. Speech eval completed 9/17. Cont comfort measure.

## 2022-04-09 NOTE — Progress Notes (Addendum)
PROGRESS NOTE [name]  KTG:[id number] DOB: August 19, 1931 DOA: 04/06/2022 PCP: Burnard Bunting, MD   Brief Narrative/Hospital Course: 88 y.o. female with medical history significant of PE on eliquis, CHF, HTN, presented  to ED with onset of confusion and lethargy at home. She was found to be febrile , hypotensive ,  reports persistent cough and abdominal pain.  Initial CXR is negative. CT abd and pelvis show Small bowel loops in the left abdomen show ill-defined wall thickening with associated edema/inflammation. 3 cm collection of gas and debris identified in the left abdominal mesentery. . A second bilobed large collection of gas is identified more inferiorly in the left small bowel mesentery. This collection has no perceptible wall and contains only gas without associated fluid debris. Small bowel perforation cannot be excluded. She was also found to have stomach in a large hiatal hernia. Found to have  urinary retention and a foley catheter was placed. General surgery consulted for abnormal ct findings, was started on IV antibiotics.  Palliative care was consulted-discussed with her husband-patient changed to DNR, no desire for surgical intervention and kept on conservative management     Subjective: Seen and examined.  Alert awake oriented to self on room air appears comfortable   Assessment and Plan: Principal Problem:   Sepsis ([place]) Active Problems:   CAP (community acquired pneumonia)   Acute encephalopathy   AV block, Mobitz 1   Essential hypertension, benign   Chronic systolic CHF (congestive heart failure) ([place])   History of pulmonary embolism   Sepsis with fever tachypnea hypotension Suspected small bowel diverticulitis: Continue conservative management seen by surgery family not interested in surgical intervention.  Palliative care following.  Continue ceftriaxone and Flagyl IV fluids pain control  Cough/fever: No pneumonia on chest x-ray cough likely from large hiatal  hernia COVID-19 is negative respiratory panel negative.  Blood cultures no growth to date  Chronic systolic and diastolic heart failure EF 45-50% and G1 DD and 2/22.  Echo this admission with EF 55 to 60%, G2 DD, small pericardial effusion, cannot exclude small PFO. Off IV fluids monitor for weight and volume status. Net IO Since Admission: -2,326.67 mL [04/09/22 1051]   History of PE on Eliquis: On heparin drip, resume Eliquis hopefully soon since no plan for surgical intervention and conservative management.  Hypomagnesemia stable. Anemia likely from chronic disease:Marland Kitchen  Monitor  Acute metabolic encephalopathy: Alert awake communicative. Hypotension resolved monitor AV block, Mobitz 1-monitor  Goals of care continue DNR, palliative care following closely continue conservative management  DVT prophylaxis: heparin Code Status:   Code Status: DNR Family Communication: plan of care discussed with patient at bedside. Patient status is: Inpatient because of sepsis acute diverticulitis Level of care: Telemetry   Dispo:Anticipated disposition: SNF/tbd  Mobility Assessment (last 72 hours)     Mobility Assessment     Row Name 04/08/22 1955 04/08/22 1544 04/07/22 2221 04/07/22 1459     Does patient have an order for bedrest or is patient medically unstable No - Continue assessment -- No - Continue assessment No - Continue assessment    What is the highest level of mobility based on the progressive mobility assessment? Level 3 (Stands with assist) - Balance while standing  and cannot march in place Level 4 (Walks with assist in room) - Balance while marching in place and cannot step forward and back - Complete Level 3 (Stands with assist) - Balance while standing  and cannot march in place Level 3 (Stands with assist) -  Balance while standing  and cannot march in place    Is the above level different from baseline mobility prior to current illness? Yes - Recommend PT order -- Yes - Recommend PT  order Yes - Recommend PT order              Objective: Vitals last 24 hrs: Vitals:   04/07/22 2116 04/08/22 0253 04/08/22 1940 04/09/22 0659  BP: 126/64 137/73 (!) 144/71 (!) 124/59  Pulse: 62 71 64 61  Resp: '18 20 18 16  '$ Temp: 97.7 F (36.5 C) 97.6 F (36.4 C) 98.3 F (36.8 C) 98.1 F (36.7 C)  TempSrc:   Oral Oral  SpO2: 92% 92% 99% 93%  Weight:      Height:       Weight change:   Physical Examination: General exam: alert awake,older than stated age, weak appearing. HEENT:Oral mucosa moist, Ear/Nose WNL grossly, dentition normal. Respiratory system: bilaterally diminished BS, no use of accessory muscle Cardiovascular system: S1 & S2 +, No JVD. Gastrointestinal system: Abdomen soft,NT,ND, BS+ Nervous System:Alert, awake, moving extremities and grossly nonfocal Extremities: LE edema neg,distal peripheral pulses palpable.  Skin: No rashes,no icterus. MSK: Normal muscle bulk,tone, power  Medications reviewed:  Scheduled Meds:  calcium-vitamin D  1 tablet Oral Daily   Chlorhexidine Gluconate Cloth  6 each Topical Daily   escitalopram  20 mg Oral Daily   gabapentin  300 mg Oral QID   pantoprazole (PROTONIX) IV  40 mg Intravenous Q24H   tamsulosin  0.4 mg Oral Daily   Continuous Infusions:  cefTRIAXone (ROCEPHIN)  IV 2 g (04/08/22 1957)   heparin 700 Units/hr (04/09/22 0733)   metronidazole 500 mg (04/08/22 2239)      Diet Order             Diet NPO time specified  Diet effective now                            Intake/Output Summary (Last 24 hours) at 04/09/2022 1048 Last data filed at 04/09/2022 0900 Gross per 24 hour  Intake 92.32 ml  Output 2052 ml  Net -1959.68 ml   Net IO Since Admission: -2,326.67 mL [04/09/22 1048]  Wt Readings from Last 3 Encounters:  04/07/22 54 kg  10/12/20 54.8 kg  09/15/20 62.3 kg     Unresulted Labs (From admission, onward)     Start     Ordered   04/10/22 0500  Heparin level (unfractionated)  Daily at 5am,    R      04/09/22 0436   04/10/22 0500  APTT  Daily at 5am,   R      04/09/22 0436   04/09/22 1200  APTT  Once-Timed,   TIMED        04/09/22 0436   04/09/22 0500  CBC  Daily at 5am,   R      04/07/22 1457          Data Reviewed: I have personally reviewed following labs and imaging studies CBC: Recent Labs  Lab 04/06/22 1629 04/07/22 0354 04/08/22 0517 04/09/22 0054  WBC 12.4* 11.3* 7.3 6.8  NEUTROABS 8.9*  --   --   --   HGB 12.5 12.7 11.4* 10.7*  HCT 38.3 38.1 34.0* 31.9*  MCV 93.4 92.7 92.6 92.5  PLT 168 147* 130* 132*   Basic Metabolic Panel: Recent Labs  Lab 04/06/22 1629 04/07/22 0523 04/08/22 0517 04/09/22 0054  NA 138  136  --  136  K 3.8 3.7  --  3.6  CL 105 103  --  104  CO2 25 25  --  21*  GLUCOSE 106* 96  --  73  BUN 19 18  --  18  CREATININE 0.65 0.66  --  0.61  CALCIUM 9.0 9.0  --  8.3*  MG 1.6*  --  1.7  --    GFR: Estimated Creatinine Clearance: 42.2 mL/min (by C-G formula based on SCr of 0.61 mg/dL). Liver Function Tests: Recent Labs  Lab 04/06/22 1629 04/07/22 0523  AST 31 23  ALT 24 20  ALKPHOS 63 66  BILITOT 1.5* 1.6*  PROT 6.6 6.4*  ALBUMIN 3.5 3.2*   No results for input(s): "LIPASE", "AMYLASE" in the last 168 hours. No results for input(s): "AMMONIA" in the last 168 hours. Coagulation Profile: Recent Labs  Lab 04/06/22 1629  INR 1.5*   BNP (last 3 results) No results for input(s): "PROBNP" in the last 8760 hours. HbA1C: No results for input(s): "HGBA1C" in the last 72 hours. CBG: No results for input(s): "GLUCAP" in the last 168 hours. Lipid Profile: No results for input(s): "CHOL", "HDL", "LDLCALC", "TRIG", "CHOLHDL", "LDLDIRECT" in the last 72 hours. Thyroid Function Tests: No results for input(s): "TSH", "T4TOTAL", "FREET4", "T3FREE", "THYROIDAB" in the last 72 hours. Sepsis Labs: Recent Labs  Lab 04/06/22 1629  LATICACIDVEN 0.8    Recent Results (from the past 240 hour(s))  SARS Coronavirus 2 by RT PCR  (hospital order, performed in [facility] hospital lab) *cepheid single result test* Anterior Nasal Swab     Status: None   Collection Time: 04/06/22  4:28 PM   Specimen: Anterior Nasal Swab  Result Value Ref Range Status   SARS Coronavirus 2 by RT PCR NEGATIVE NEGATIVE Final    Comment: (NOTE) SARS-CoV-2 target nucleic acids are NOT DETECTED.  The SARS-CoV-2 RNA is generally detectable in upper and lower respiratory specimens during the acute phase of infection. The lowest concentration of SARS-CoV-2 viral copies this assay can detect is 250 copies / mL. A negative result does not preclude SARS-CoV-2 infection and should not be used as the sole basis for treatment or other patient management decisions.  A negative result may occur with improper specimen collection / handling, submission of specimen other than nasopharyngeal swab, presence of viral mutation(s) within the areas targeted by this assay, and inadequate number of viral copies (<250 copies / mL). A negative result must be combined with clinical observations, patient history, and epidemiological information.  Fact Sheet for Patients:   https://www.patel.info/  Fact Sheet for Healthcare Providers: https://hall.com/  This test is not yet approved or  cleared by the [place] FDA and has been authorized for detection and/or diagnosis of SARS-CoV-2 by FDA under an Emergency Use Authorization (EUA).  This EUA will remain in effect (meaning this test can be used) for the duration of the COVID-19 declaration under Section 564(b)(1) of the Act, 21 U.S.C. section 360bbb-3(b)(1), unless the authorization is terminated or revoked sooner.  Performed at [facility], [place] [address], [place], [place] [address]   Blood Culture (routine x 2)     Status: None (Preliminary result)   Collection Time: 04/06/22  4:30 PM   Specimen: BLOOD  Result Value Ref Range Status    Specimen Description   Final    BLOOD BLOOD RIGHT ARM Performed at [facility] [address], [place], [place] [address]    Special  Requests   Final    BOTTLES DRAWN AEROBIC AND ANAEROBIC Blood Culture results may not be optimal due to an inadequate volume of blood received in culture bottles Performed at [facility] [address] [place], [place] [address]    Culture   Final    NO GROWTH 3 DAYS Performed at [facility], [place] [address], [place], [place] [address]    Report Status PENDING  Incomplete  Urine Culture     Status: None   Collection Time: 04/06/22  6:20 PM   Specimen: In/Out Cath Urine  Result Value Ref Range Status   Specimen Description   Final    IN/OUT CATH URINE Performed at [facility] [address], [place], [place] [address]    Special Requests   Final    NONE Performed at [facility], [place] [address], [place], [place] [address]    Culture   Final    NO GROWTH Performed at [facility], [place] [address] [place], [place] [address]    Report Status 04/07/2022 FINAL  Final  Respiratory (~20 pathogens) panel by PCR     Status: None   Collection Time: 04/06/22  9:00 PM   Specimen: Nasopharyngeal Swab; Respiratory  Result Value Ref Range Status   Adenovirus NOT DETECTED NOT DETECTED Final   Coronavirus 229E NOT DETECTED NOT DETECTED Final    Comment: (NOTE) The Coronavirus on the Respiratory Panel, DOES NOT test for the novel  Coronavirus (2019 nCoV)    Coronavirus HKU1 NOT DETECTED NOT DETECTED Final   Coronavirus NL63 NOT DETECTED NOT DETECTED Final   Coronavirus OC43 NOT DETECTED NOT DETECTED Final   Metapneumovirus NOT DETECTED NOT DETECTED Final   Rhinovirus / Enterovirus NOT DETECTED NOT DETECTED Final   Influenza A NOT DETECTED NOT DETECTED Final   Influenza B NOT DETECTED NOT DETECTED Final   Parainfluenza Virus 1 NOT DETECTED NOT DETECTED Final    Parainfluenza Virus 2 NOT DETECTED NOT DETECTED Final   Parainfluenza Virus 3 NOT DETECTED NOT DETECTED Final   Parainfluenza Virus 4 NOT DETECTED NOT DETECTED Final   Respiratory Syncytial Virus NOT DETECTED NOT DETECTED Final   Bordetella pertussis NOT DETECTED NOT DETECTED Final   Bordetella Parapertussis NOT DETECTED NOT DETECTED Final   Chlamydophila pneumoniae NOT DETECTED NOT DETECTED Final   Mycoplasma pneumoniae NOT DETECTED NOT DETECTED Final    Comment: Performed at [facility], [place] [address], [place],  [address]    Antimicrobials: Anti-infectives (From admission, onward)    Start     Dose/Rate Route Frequency Ordered Stop   04/07/22 2000  cefTRIAXone (ROCEPHIN) 2 g in sodium chloride 0.9 % 100 mL IVPB        2 g 200 mL/hr over 30 Minutes Intravenous Every 24 hours 04/06/22 2105 04/11/22 1959   04/07/22 1030  metroNIDAZOLE (FLAGYL) IVPB 500 mg        500 mg 100 mL/hr over 60 Minutes Intravenous Every 12 hours 04/07/22 1007     04/07/22 1000  doxycycline (VIBRA-TABS) tablet 100 mg  Status:  Discontinued        100 mg Oral Every 12 hours 04/06/22 2105 04/07/22 1231   04/06/22 1945  doxycycline (VIBRA-TABS) tablet 100 mg        100 mg Oral  Once 04/06/22 1930 04/06/22 2014   04/06/22 1930  cefTRIAXone (ROCEPHIN) 1 g in sodium chloride 0.9 % 100 mL IVPB  1 g 200 mL/hr over 30 Minutes Intravenous  Once 04/06/22 1927 04/06/22 2302      Culture/Microbiology    Component Value Date/Time   SDES  04/06/2022 1820    IN/OUT CATH URINE Performed at [facility], [place] [address], [place], [place] [address]    SPECREQUEST  04/06/2022 1820    NONE Performed at [facility], [place] [address], [place], [place] [address]    CULT  04/06/2022 1820    NO GROWTH Performed at [facility] [address], [place], [place] [address]    REPTSTATUS 04/07/2022 FINAL 04/06/2022 1820    Other culture-see note  Radiology  Studies: CT ABDOMEN PELVIS WO CONTRAST  Result Date: 04/08/2022 CLINICAL DATA:  Abdominal pain, possible small bowel diverticuli versus perforation, follow-up EXAM: CT ABDOMEN AND PELVIS WITHOUT CONTRAST TECHNIQUE: Multidetector CT imaging of the abdomen and pelvis was performed following the standard protocol without IV contrast. RADIATION DOSE REDUCTION: This exam was performed according to the departmental dose-optimization program which includes automated exposure control, adjustment of the mA and/or kV according to patient size and/or use of iterative reconstruction technique. COMPARISON:  CT abdomen/pelvis dated 04/07/2022 FINDINGS: Motion degraded images. Lower chest: Small bilateral pleural effusions. Associated lower lobe atelectasis. Hepatobiliary: Unenhanced liver is unremarkable. Gallbladder is unremarkable. No intrahepatic or extrahepatic ductal dilatation. Pancreas: Within normal limits. Spleen: Within normal limits but Adrenals/Urinary Tract: Adrenal glands are within normal limits. Kidneys are normal limits.  No renal calculi or hydronephrosis. Bladder is decompressed by an indwelling Foley catheter. Stomach/Bowel: Stomach is notable for a large hiatal hernia/inverted intrathoracic stomach. No evidence of bowel obstruction. Normal appendix (series 2/image 62). Evaluation of the mid abdomen is constrained by motion degradation. However, the suspected small bowel diverticulum in the left mid/lower abdomen (series 2/image 60) remains unchanged. However, the additional mesenteric gas collection superiorly in the left mid abdomen (series 2/image 51) demonstrates surrounding interval inflammatory changes (series 2/image 51), suggesting small bowel diverticulitis, less likely localized perforation. While contrast is passed into the colon the time of the study, there is no contrast within this collection and no intraluminal contrast is seen. Extensive left colonic diverticulosis, without evidence of  diverticulitis. Vascular/Lymphatic: No evidence of abdominal aortic aneurysm. Atherosclerotic calcifications of the abdominal aorta and branch vessels. No suspicious abdominopelvic lymphadenopathy. Reproductive: Uterus is within normal limits. Bilateral ovaries are within normal limits. Other: Trace pelvic ascites (series 2/image 67).  No free air. Musculoskeletal: Prior vertebral augmentation at L1, L2, and L4. Moderate compression fracture deformity at T12, unchanged. Mild multilevel degenerative changes the visualized thoracolumbar spine. IMPRESSION: Motion degraded images. Suspected small bowel diverticulitis in the left mid/lower abdomen, with interval progression of inflammatory changes. Localized perforation is considered less likely. No extraluminal contrast is seen. No free air. Additional stable ancillary findings as above. Electronically Signed   By: Julian Hy M.D.   On: 04/08/2022 22:43   ECHOCARDIOGRAM COMPLETE  Result Date: 04/08/2022    ECHOCARDIOGRAM REPORT   Patient Name:   WYNETTE JERSEY Date of Exam: 04/08/2022 Medical Rec #:  [id number]     Height:       66.0 in Accession #:    [id number]    Weight:       119.0 lb Date of Birth:  [birth year]    BSA:          1.604 m Patient Age:    37 years      BP:  137/73 mmHg Patient Gender: F             HR:           65 bpm. Exam Location:  Inpatient Procedure: 2D Echo, Cardiac Doppler and Color Doppler Indications:    Preoperative evaluation  History:        Patient has prior history of Echocardiogram examinations, most                 recent 09/04/2020. Risk Factors:Hypertension and ETOH.  Sonographer:    Bernadene Person RDCS Referring Phys: Delta  1. Left ventricular ejection fraction, by estimation, is 55 to 60%. The left ventricle has normal function. The left ventricle has no regional wall motion abnormalities. There is mild left ventricular hypertrophy of the basal-septal segment. Left ventricular diastolic  parameters are consistent with Grade II diastolic dysfunction (pseudonormalization). Elevated left ventricular end-diastolic pressure.  2. Right ventricular systolic function is mildly reduced. The right ventricular size is normal. There is mildly elevated pulmonary artery systolic pressure. The estimated right ventricular systolic pressure is 51.7 mmHg.  3. Left atrial size was moderately dilated.  4. Right atrial size was moderately dilated.  5. A small pericardial effusion is present. The pericardial effusion is posterior to the left ventricle.  6. The mitral valve is abnormal. Mild mitral valve regurgitation. No evidence of mitral stenosis. Moderate mitral annular calcification.  7. Tricuspid valve regurgitation is mild to moderate.  8. The aortic valve is tricuspid. There is mild calcification of the aortic valve. There is mild thickening of the aortic valve. Aortic valve regurgitation is mild. Aortic valve sclerosis/calcification is present, without any evidence of aortic stenosis.  9. The inferior vena cava is normal in size with <50% respiratory variability, suggesting right atrial pressure of 8 mmHg. 10. Cannot exclude a small PFO. Comparison(s): Prior images reviewed side by side. EF appears improved compared to prior. While focal wall motion abnormalities were better visualized on prior study, no severe WMA noted on current study. FINDINGS  Left Ventricle: Left ventricular ejection fraction, by estimation, is 55 to 60%. The left ventricle has normal function. The left ventricle has no regional wall motion abnormalities. The left ventricular internal cavity size was normal in size. There is  mild left ventricular hypertrophy of the basal-septal segment. Left ventricular diastolic parameters are consistent with Grade II diastolic dysfunction (pseudonormalization). Elevated left ventricular end-diastolic pressure. Right Ventricle: The right ventricular size is normal. Right vetricular wall thickness was not  well visualized. Right ventricular systolic function is mildly reduced. There is mildly elevated pulmonary artery systolic pressure. The tricuspid regurgitant velocity is 2.94 m/s, and with an assumed right atrial pressure of 8 mmHg, the estimated right ventricular systolic pressure is 61.6 mmHg. Left Atrium: Left atrial size was moderately dilated. Right Atrium: Right atrial size was moderately dilated. Pericardium: A small pericardial effusion is present. The pericardial effusion is posterior to the left ventricle. Mitral Valve: The mitral valve is abnormal. There is moderate thickening of the mitral valve leaflet(s). There is moderate calcification of the mitral valve leaflet(s). Moderate mitral annular calcification. Mild mitral valve regurgitation. No evidence of mitral valve stenosis. Tricuspid Valve: The tricuspid valve is normal in structure. Tricuspid valve regurgitation is mild to moderate. No evidence of tricuspid stenosis. Aortic Valve: The aortic valve is tricuspid. There is mild calcification of the aortic valve. There is mild thickening of the aortic valve. Aortic valve regurgitation is mild. Aortic regurgitation PHT measures 332 msec. Aortic  valve sclerosis/calcification is present, without any evidence of aortic stenosis. Pulmonic Valve: The pulmonic valve was not well visualized. Pulmonic valve regurgitation is mild. Aorta: The aortic root, ascending aorta, aortic arch and descending aorta are all structurally normal, with no evidence of dilitation or obstruction. Venous: The inferior vena cava is normal in size with less than 50% respiratory variability, suggesting right atrial pressure of 8 mmHg. IAS/Shunts: Cannot exclude a small PFO.  LEFT VENTRICLE PLAX 2D LVIDd:         4.40 cm     Diastology LVIDs:         2.50 cm     LV e' medial:    3.65 cm/s LV PW:         1.10 cm     LV E/e' medial:  35.3 LV IVS:        1.00 cm     LV e' lateral:   6.65 cm/s LVOT diam:     2.10 cm     LV E/e' lateral:  19.4 LV SV:         93 LV SV Index:   58 LVOT Area:     3.46 cm  LV Volumes (MOD) LV vol d, MOD A2C: 64.9 ml LV vol d, MOD A4C: 71.2 ml LV vol s, MOD A2C: 27.1 ml LV vol s, MOD A4C: 26.6 ml LV SV MOD A2C:     37.8 ml LV SV MOD A4C:     71.2 ml LV SV MOD BP:      44.9 ml RIGHT VENTRICLE RV S prime:     9.96 cm/s TAPSE (M-mode): 2.3 cm LEFT ATRIUM             Index        RIGHT ATRIUM           Index LA diam:        3.80 cm 2.37 cm/m   RA Area:     18.00 cm LA Vol (A2C):   64.0 ml 39.87 ml/m  RA Volume:   50.90 ml  31.73 ml/m LA Vol (A4C):   79.4 ml 49.50 ml/m LA Biplane Vol: 75.4 ml 47.00 ml/m  AORTIC VALVE LVOT Vmax:         126.00 cm/s LVOT Vmean:        81.900 cm/s LVOT VTI:          0.269 m AI PHT:            332 msec AR Vena Contracta: 0.20 cm  AORTA Ao Root diam: 3.40 cm Ao Asc diam:  3.60 cm MITRAL VALVE                TRICUSPID VALVE MV Area (PHT): 3.60 cm     TR Peak grad:   34.6 mmHg MV Decel Time: 211 msec     TR Vmax:        294.00 cm/s MV E velocity: 129.00 cm/s MV A velocity: 131.00 cm/s  SHUNTS MV E/A ratio:  0.98         Systemic VTI:  0.27 m                             Systemic Diam: 2.10 cm Buford Dresser MD Electronically signed by Buford Dresser MD Signature Date/Time: 04/08/2022/6:04:56 PM    Final      LOS: 2 days   Antonieta Pert, MD [facility]  04/09/2022, 10:48 AM

## 2022-04-09 NOTE — Progress Notes (Signed)
Notes reviewed from palliative care.  Goals of care established.  Family and patient do not request any surgical intervention at this point time was to treat medically.  Recent CT scan shows no extravasation of contrast and she is comfortable.  I agree with conservative care given her advanced age and high operative risk.  Long-term recovery from this and long-term outcome from this will be poor with respect to quality of care.  Okay to feed patient as part of comfort means.  Family does not wish aggressive treatment with surgery and surgery will sign off at this point time  Available as needed

## 2022-04-09 NOTE — Progress Notes (Signed)
ANTICOAGULATION CONSULT NOTE - follow up  Pharmacy Consult for IV heparin (apixaban PTA) Indication:  h/o DVT, PE  Allergies  Allergen Reactions   Oysters [Shellfish Allergy] Nausea And Vomiting   Codeine Nausea And Vomiting   Penicillins Rash    Patient Measurements: Height: '5\' 6"'$  (167.6 cm) Weight: 54 kg (119 lb 0.8 oz) IBW/kg (Calculated) : 59.3 Heparin Dosing Weight: 54 kg  Vital Signs: Temp: 97.7 F (36.5 C) (09/16 1225) Temp Source: Oral (09/16 1225) BP: 132/57 (09/16 1225) Pulse Rate: 57 (09/16 1225)  Labs: Recent Labs    04/06/22 1629 04/07/22 0354 04/07/22 0523 04/08/22 0517 04/08/22 1631 04/09/22 0054 04/09/22 1205  HGB 12.5 12.7  --  11.4*  --  10.7*  --   HCT 38.3 38.1  --  34.0*  --  31.9*  --   PLT 168 147*  --  130*  --  147*  --   APTT 40*  --   --  123* 78* 95* 72*  LABPROT 17.5*  --   --   --   --   --   --   INR 1.5*  --   --   --   --   --   --   HEPARINUNFRC  --   --   --  >1.10*  --  >1.10*  --   CREATININE 0.65  --  0.66  --   --  0.61  --      Estimated Creatinine Clearance: 42.2 mL/min (by C-G formula based on SCr of 0.61 mg/dL).  PMH: PE 2/22 and h/o DVT, arthritis, breast cancer '96, depression, HLD, h/o alcohol, HTN, melanoma, OP, PVD, mult SCC, spinal stenosis, CHF, h/o diverticulosis  AC/Heme: Apixaban PTA (LD 9/14 AM) for h/o PE/DVT. Hgb 12.7, Plts 147. INR 1.5 (elevated from Apixaban) - 9/14: Transition to IV heparin in prep for possible surgery.  Today, 04/09/22:  Noon aPTT 72 = seconds, remains therapeutic on heparin infusion at 700 units/hr AM Heparin level > 1.1, remains falsely elevated due to recent Apixaban CBC: Hgb decreased to 10.7. Pltc slightly low but improved to 147K Per RN, slight bleeding at IV site but otherwise no issues Surgery signed off as family has declined surgery and request medical/conservative treatment  Goal of Therapy:  aPTT 66-102 seconds Heparin level 0.3-0.7 units/mL Monitor platelets by  anticoagulation protocol: Yes   Plan:  Continue IV heparin infusion at 700 units/hr Will monitor/titrate heparin infusion rate based on aPTT until heparin level and aPTT correlate Daily heparin level, aPTT, CBC Monitor closely for s/sx of bleeding ? Resume PTA Eliquis or start full or prophylactic dose lovenox   Eudelia Bunch, Pharm.D 04/09/2022 1:14 PM

## 2022-04-09 NOTE — Progress Notes (Signed)
ANTICOAGULATION CONSULT NOTE - follow up  Pharmacy Consult for IV heparin (apixaban PTA) Indication:  h/o DVT, PE  Allergies  Allergen Reactions   Oysters [Shellfish Allergy] Nausea And Vomiting   Codeine Nausea And Vomiting   Penicillins Rash    Patient Measurements: Height: '5\' 6"'$  (167.6 cm) Weight: 54 kg (119 lb 0.8 oz) IBW/kg (Calculated) : 59.3 Heparin Dosing Weight: 54 kg  Vital Signs: Temp: 98.3 F (36.8 C) (09/15 1940) Temp Source: Oral (09/15 1940) BP: 144/71 (09/15 1940) Pulse Rate: 64 (09/15 1940)  Labs: Recent Labs    04/06/22 1629 04/07/22 0354 04/07/22 0523 04/08/22 0517 04/08/22 1631 04/09/22 0054  HGB 12.5 12.7  --  11.4*  --  10.7*  HCT 38.3 38.1  --  34.0*  --  31.9*  PLT 168 147*  --  130*  --  147*  APTT 40*  --   --  123* 78* 95*  LABPROT 17.5*  --   --   --   --   --   INR 1.5*  --   --   --   --   --   HEPARINUNFRC  --   --   --  >1.10*  --  >1.10*  CREATININE 0.65  --  0.66  --   --  0.61     Estimated Creatinine Clearance: 42.2 mL/min (by C-G formula based on SCr of 0.61 mg/dL).   Medical History: Past Medical History:  Diagnosis Date   Arthritis    "in my back"   Breast cancer ([place]) 1996   left breast cancer    Complication of anesthesia 04/18/2012   "didn't tolerate it today very well; had the shakes and very hard time w/it"   Depression 11/20/2012   DVT (deep venous thrombosis) (HCC)    High cholesterol    History of alcoholism ([place])    7 1/2 years clean   Hypertension    Melanoma ([place]) 01/15/2004   upper right arm   Osteoporosis    Peripheral vascular disease (HCC)    hx of ligation    Personal history of radiation therapy 1993   Left Breast Cancer   SCC (squamous cell carcinoma) 01/15/2004   left post upper arm, Right elbow, post left knee   SCC (squamous cell carcinoma) 05/21/2013   right forearm, right jawline, left cheek   SCC (squamous cell carcinoma) 08/24/2016   right shin,right thigh   SCC (squamous cell  carcinoma) 11/01/2016   SCC (squamous cell carcinoma) 02/02/2018   left outer arm,right outer thigh   SCC (squamous cell carcinoma) 12/13/2018   right jawline   Spinal stenosis    Assessment: Active Problem(s): weakness  PMH: PE 2/22 and h/o DVT, arthritis, breast cancer '96, depression, HLD, h/o alcohol, HTN, melanoma, OP, PVD, mult SCC, spinal stenosis, CHF, h/o diverticulosis  AC/Heme: Apixaban PTA (LD 9/14 AM) for h/o PE/DVT. Hgb 12.7, Plts 147. INR 1.5 (elevated from Apixaban) - 9/14: Transition to IV heparin in prep for possible surgery.  Today, 04/09/22:  aPTT 95 seconds, remains therapeutic but on upper end of goal range on heparin infusion at 700 units/hr Heparin level > 1.1, remains falsely elevated due to recent Apixaban CBC: Hgb decreased to 10.7. Pltc slightly low but improved to 147K Per RN, slight bleeding at IV site but otherwise no issues  Goal of Therapy:  aPTT 66-102 seconds Heparin level 0.3-0.7 units/mL Monitor platelets by anticoagulation protocol: Yes   Plan:  Continue IV heparin infusion at 700 units/hr  Will monitor/titrate heparin infusion rate based on aPTT until heparin level and aPTT correlate Recheck aPTT at noon to ensure remains within therapeutic range Daily heparin level, aPTT, CBC Monitor closely for s/sx of bleeding   Lindell Spar, PharmD, BCPS Clinical Pharmacist  04/09/2022 4:26 AM

## 2022-04-10 DIAGNOSIS — G9340 Encephalopathy, unspecified: Secondary | ICD-10-CM | POA: Diagnosis not present

## 2022-04-10 DIAGNOSIS — Z01810 Encounter for preprocedural cardiovascular examination: Secondary | ICD-10-CM

## 2022-04-10 DIAGNOSIS — A419 Sepsis, unspecified organism: Secondary | ICD-10-CM | POA: Diagnosis not present

## 2022-04-10 DIAGNOSIS — R6520 Severe sepsis without septic shock: Secondary | ICD-10-CM | POA: Diagnosis not present

## 2022-04-10 MED ORDER — DEXTROSE-NACL 5-0.9 % IV SOLN: INTRAVENOUS | Status: DC

## 2022-04-10 NOTE — Evaluation (Signed)
Clinical/Bedside Swallow Evaluation Patient Details  Name: GLENDA SPELMAN MRN: [id number] Date of Birth: April 27, 1932  Today's Date: 04/10/2022 Time: SLP Start Time (ACUTE ONLY): 2585 SLP Stop Time (ACUTE ONLY): 1401 SLP Time Calculation (min) (ACUTE ONLY): 16 min  Past Medical History:  Past Medical History:  Diagnosis Date   Arthritis    "in my back"   Breast cancer ([place]) 1996   left breast cancer    Complication of anesthesia 04/18/2012   "didn't tolerate it today very well; had the shakes and very hard time w/it"   Depression 11/20/2012   DVT (deep venous thrombosis) (HCC)    High cholesterol    History of alcoholism ([place])    7 1/2 years clean   Hypertension    Melanoma ([place]) 01/15/2004   upper right arm   Osteoporosis    Peripheral vascular disease ([place])    hx of ligation    Personal history of radiation therapy 1993   Left Breast Cancer   SCC (squamous cell carcinoma) 01/15/2004   left post upper arm, Right elbow, post left knee   SCC (squamous cell carcinoma) 05/21/2013   right forearm, right jawline, left cheek   SCC (squamous cell carcinoma) 08/24/2016   right shin,right thigh   SCC (squamous cell carcinoma) 11/01/2016   SCC (squamous cell carcinoma) 02/02/2018   left outer arm,right outer thigh   SCC (squamous cell carcinoma) 12/13/2018   right jawline   Spinal stenosis    Past Surgical History:  Past Surgical History:  Procedure Laterality Date   BREAST LUMPECTOMY Left 1996   HARDWARE REMOVAL Left 09/03/2020   Procedure: HARDWARE REMOVAL;  Surgeon: Altamese [place]ter[place], MD;  Location: [facility];  Service: Orthopedics;  Laterality: Left;   I & D EXTREMITY  04/17/2012   Procedure: IRRIGATION AND DEBRIDEMENT EXTREMITY;  Surgeon: Rudean Haskell, MD;  Location: [facility];  Service: Orthopedics;  Laterality: Right;   INTRAMEDULLARY (IM) NAIL INTERTROCHANTERIC Left 09/03/2020   Procedure: INTRAMEDULLARY (IM) NAIL INTERTROCHANTRIC;  Surgeon: Altamese Waucoma, MD;  Location: [facility];   Service: Orthopedics;  Laterality: Left;   KNEE ARTHROSCOPY  04/17/2012   Procedure: ARTHROSCOPY KNEE;  Surgeon: Rudean Haskell, MD;  Location: [facility];  Service: Orthopedics;  Laterality: Right;   KYPHOPLASTY  2009, 2013   thorasic, lumbar   MENISECTOMY Right 04/11/2012   ORIF FEMUR FRACTURE Left 11/01/2019   Procedure: OPEN REDUCTION INTERNAL FIXATION (ORIF) DISTAL FEMUR FRACTURE;  Surgeon: Altamese [place], MD;  Location: [facility];  Service: Orthopedics;  Laterality: Left;   ORIF PERIPROSTHETIC FRACTURE Right 05/03/2018   Procedure: RIGHT RETROGRADE FEMORAL NAIL;  Surgeon: Rod Can, MD;  Location: [facility];  Service: Orthopedics;  Laterality: Right;   POSTERIOR LAMINECTOMY / DECOMPRESSION LUMBAR SPINE  1980's   rotator cuff surgery  Right    TONSILLECTOMY AND ADENOIDECTOMY     "I was a child"   TOTAL KNEE ARTHROPLASTY Right 10/29/2012   Procedure: RIGHT TOTAL KNEE ARTHROPLASTY;  Surgeon: Gearlean Alf, MD;  Location: [facility];  Service: Orthopedics;  Laterality: Right;   VEIN LIGATION     HPI:  88 y.o. female with medical history significant of PE on eliquis, CHF, HTN, presented  to ED with onset of confusion and lethargy at home. She was found to be febrile, hypotensive,  reports persistent cough and abdominal pain. Small bowel diverticulitis; palliative care on board; no sx intervention per pt/family.    Assessment / Plan / Recommendation  Clinical Impression  Ms. Rennaker was seen  for a repeat swallow evaluation after being held NPO for GI issues/testing. She denies dysphagia, but reports occasional coughing if she uses a straw. Today's findings were consistent with prior CSE on 04/07/22, which reported grossly normal swallow function. Pt was seen with thin liquids via cup (straw trials deferred at her request), purees, and regular consistency solids. No s/s aspiration observed. RN with no concerns. Pt to resume a regular consistency diet with thin liquids as approved from a GI perspective. Prior  CSE recommended observation x1. Will consider this her observation. All goals met. No further ST needs.    SLP Visit Diagnosis: Dysphagia, oropharyngeal phase (R13.12)    Aspiration Risk  Mild aspiration risk    Diet Recommendation Regular;Thin liquid   Medication Administration: Whole meds with liquid Supervision: Patient able to self feed Compensations: Slow rate;Small sips/bites    Other  Recommendations Oral Care Recommendations: Oral care BID    Recommendations for follow up therapy are one component of a multi-disciplinary discharge planning process, led by the attending physician.  Recommendations may be updated based on patient status, additional functional criteria and insurance authorization.  Follow up Recommendations No SLP follow up      Frequency and Duration min 2x/week  1 week       Prognosis Prognosis for Safe Diet Advancement: Good Barriers to Reach Goals: Cognitive deficits      Swallow Study   General Date of Onset: 04/07/22 HPI: 88 y.o. female with medical history significant of PE on eliquis, CHF, HTN, presented  to ED with onset of confusion and lethargy at home. She was found to be febrile, hypotensive,  reports persistent cough and abdominal pain. Small bowel diverticulitis; palliative care on board; no sx intervention per pt/family. Type of Study: Bedside Swallow Evaluation Previous Swallow Assessment: CSE 04/07/22 WNL Diet Prior to this Study: Regular;Thin liquids Temperature Spikes Noted: No Respiratory Status: Room air History of Recent Intubation: No Behavior/Cognition: Alert;Cooperative;Pleasant mood Oral Cavity Assessment: Within Functional Limits Oral Care Completed by SLP: No Oral Cavity - Dentition: Adequate natural dentition Vision: Functional for self-feeding Self-Feeding Abilities: Able to feed self Patient Positioning: Upright in bed Baseline Vocal Quality: Normal Volitional Cough: Strong Volitional Swallow: Able to elicit     Oral/Motor/Sensory Function Overall Oral Motor/Sensory Function: Within functional limits   Thin Liquid Thin Liquid: Within functional limits Presentation: Cup    Puree Puree: Within functional limits Presentation: Spoon   Solid     Solid: Within functional limits Presentation: [place]. Lasharon Dunivan, M.S., CCC-SLP Speech-Language Pathologist Acute Rehabilitation Services Pager: [place] 04/10/2022,2:23 PM

## 2022-04-10 NOTE — Progress Notes (Signed)
PROGRESS NOTE [name]  TKZ:[id number] DOB: 02/15/1932 DOA: 04/06/2022 PCP: Burnard Bunting, MD   Brief Narrative/Hospital Course: 88 y.o. female with medical history significant of PE on eliquis, CHF, HTN, presented  to ED with onset of confusion and lethargy at home. She was found to be febrile , hypotensive ,  reports persistent cough and abdominal pain.  Initial CXR is negative. CT abd and pelvis show Small bowel loops in the left abdomen show ill-defined wall thickening with associated edema/inflammation. 3 cm collection of gas and debris identified in the left abdominal mesentery. . A second bilobed large collection of gas is identified more inferiorly in the left small bowel mesentery. This collection has no perceptible wall and contains only gas without associated fluid debris. Small bowel perforation cannot be excluded. She was also found to have stomach in a large hiatal hernia. Found to have  urinary retention and a foley catheter was placed. General surgery consulted for abnormal ct findings, was started on IV antibiotics.  Palliative care was consulted-discussed with her husband-patient changed to DNR, no desire for surgical intervention and kept on conservative management     Subjective: Seen and examined Sleeping comfortably able to wake up Overnight patient afebrile, last WBC count normal, on room air.     Assessment and Plan: Principal Problem:   Sepsis ([place]) Active Problems:   CAP (community acquired pneumonia)   Acute encephalopathy   AV block, Mobitz 1   Essential hypertension, benign   Chronic systolic CHF (congestive heart failure) ([place])   History of pulmonary embolism   Preoperative cardiovascular examination   Sepsis with fever tachypnea hypotension Small bowel mesenteric air/fluid collection possible small bowel diverticulitis versus small bowel perforation Suspected small bowel diverticulitis: seen by surgery family not interested in surgical intervention.   Plan is to continue conservative management with ceftriaxone and Flagyl. Speech eval prior to p.o. if unable to take p.o. we will start gentle IV fluids.   Cough/fever: No pneumonia on chest x-ray cough likely from large hiatal hernia COVID-19 is negative respiratory panel negative.  Blood cultures no growth to date.  Culture supportive care  Chronic systolic and diastolic heart failure EF 45-50% and G1 DD and 2/22.Echo this admission with EF 55 to 60%, G2 DD, small pericardial effusion, cannot exclude small PFO.Due to monitor for weight and volume status. Net IO Since Admission: -2,697.71 mL [04/10/22 1024]   History of PE on Eliquis: Switched to Lovenox, once taking p.o. will start Eliquis.   Hypomagnesemia stable. Anemia likely from chronic disease:Marland Kitchen  Monitor  Acute metabolic encephalopathy: alert, awake. Cont on supportive care fall precaution.  Hypotension BP stable AV block, Mobitz 1-monitor  Goals of care continue DNR, palliative care following closely continue conservative management.  DVT prophylaxis: heparin Code Status:   Code Status: DNR Family Communication: plan of care discussed with patient at bedside. Called her spouse and daughter Gray Bernhardt answer. Then called Linus Orn who was visiting [place]- she picked up and as we discussed/updated her- okay with plan as above.  Patient status is: Inpatient because of sepsis acute diverticulitis Level of care:Telemetry  Dispo:Anticipated disposition:SNF/TBD.  Objective: Vitals last 24 hrs: Vitals:   04/09/22 0659 04/09/22 1225 04/09/22 1922 04/10/22 0453  BP: (!) 124/59 (!) 132/57 133/63 (!) 134/57  Pulse: 61 (!) 57 60 68  Resp: '16 18 16 14  '$ Temp: 98.1 F (36.7 C) 97.7 F (36.5 C) 98 F (36.7 C) 97.9 F (36.6 C)  TempSrc: Oral Oral Oral Oral  SpO2: 93%  96% 96% 95%  Weight:      Height:       Physical Examination: General exam:Sleepy/lethargic, elderly, frail.  HEENT:Oral mucosa moist, Ear/Nose WNL grossly, dentition  normal. Respiratory system:Bilaterally diminished, no use of accessory muscle Cardiovascular system:S1 & S2 +, No JVD,. Gastrointestinal system:Abdomen soft, mildly tender,ND,BS+ Nervous System:Alert, awake, moving extremities and grossly nonfocal Extremities: LE ankle edema neg, distal peripheral pulses palpable.  Skin: No rashes,no icterus. MSK: Normal muscle bulk,tone, power   Medications reviewed:  Scheduled Meds:  calcium-vitamin D  1 tablet Oral Daily   Chlorhexidine Gluconate Cloth  6 each Topical Daily   enoxaparin (LOVENOX) injection  1 mg/kg Subcutaneous Q12H   escitalopram  20 mg Oral Daily   gabapentin  300 mg Oral QID   pantoprazole (PROTONIX) IV  40 mg Intravenous Q24H   tamsulosin  0.4 mg Oral Daily  Continuous Infusions:  cefTRIAXone (ROCEPHIN)  IV 2 g (04/09/22 2053)   metronidazole 500 mg (04/10/22 1003)    Diet Order             Diet NPO time specified  Diet effective now                  Intake/Output Summary (Last 24 hours) at 04/10/2022 1024 Last data filed at 04/10/2022 0833 Gross per 24 hour  Intake 528.96 ml  Output 900 ml  Net -371.04 ml   Net IO Since Admission: -2,697.71 mL [04/10/22 1024]  Wt Readings from Last 3 Encounters:  04/07/22 54 kg  10/12/20 54.8 kg  09/15/20 62.3 kg     Unresulted Labs (From admission, onward)    None     Data Reviewed: I have personally reviewed following labs and imaging studies CBC: Recent Labs  Lab 04/06/22 1629 04/07/22 0354 04/08/22 0517 04/09/22 0054  WBC 12.4* 11.3* 7.3 6.8  NEUTROABS 8.9*  --   --   --   HGB 12.5 12.7 11.4* 10.7*  HCT 38.3 38.1 34.0* 31.9*  MCV 93.4 92.7 92.6 92.5  PLT 168 147* 130* 681*   Basic Metabolic Panel: Recent Labs  Lab 04/06/22 1629 04/07/22 0523 04/08/22 0517 04/09/22 0054  NA 138 136  --  136  K 3.8 3.7  --  3.6  CL 105 103  --  104  CO2 25 25  --  21*  GLUCOSE 106* 96  --  73  BUN 19 18  --  18  CREATININE 0.65 0.66  --  0.61  CALCIUM 9.0 9.0   --  8.3*  MG 1.6*  --  1.7  --    GFR: Estimated Creatinine Clearance: 42.2 mL/min (by C-G formula based on SCr of 0.61 mg/dL). Liver Function Tests: Recent Labs  Lab 04/06/22 1629 04/07/22 0523  AST 31 23  ALT 24 20  ALKPHOS 63 66  BILITOT 1.5* 1.6*  PROT 6.6 6.4*  ALBUMIN 3.5 3.2*   No results for input(s): "LIPASE", "AMYLASE" in the last 168 hours. No results for input(s): "AMMONIA" in the last 168 hours. Coagulation Profile: Recent Labs  Lab 04/06/22 1629  INR 1.5*   BNP (last 3 results) No results for input(s): "PROBNP" in the last 8760 hours. HbA1C: No results for input(s): "HGBA1C" in the last 72 hours. CBG: No results for input(s): "GLUCAP" in the last 168 hours. Lipid Profile: No results for input(s): "CHOL", "HDL", "LDLCALC", "TRIG", "CHOLHDL", "LDLDIRECT" in the last 72 hours. Thyroid Function Tests: No results for input(s): "TSH", "T4TOTAL", "FREET4", "T3FREE", "THYROIDAB" in the last  72 hours. Sepsis Labs: Recent Labs  Lab 04/06/22 1629  LATICACIDVEN 0.8    Recent Results (from the past 240 hour(s))  SARS Coronavirus 2 by RT PCR (hospital order, performed in [facility] hospital lab) *cepheid single result test* Anterior Nasal Swab     Status: None   Collection Time: 04/06/22  4:28 PM   Specimen: Anterior Nasal Swab  Result Value Ref Range Status   SARS Coronavirus 2 by RT PCR NEGATIVE NEGATIVE Final    Comment: (NOTE) SARS-CoV-2 target nucleic acids are NOT DETECTED.  The SARS-CoV-2 RNA is generally detectable in upper and lower respiratory specimens during the acute phase of infection. The lowest concentration of SARS-CoV-2 viral copies this assay can detect is 250 copies / mL. A negative result does not preclude SARS-CoV-2 infection and should not be used as the sole basis for treatment or other patient management decisions.  A negative result may occur with improper specimen collection / handling, submission of specimen other than  nasopharyngeal swab, presence of viral mutation(s) within the areas targeted by this assay, and inadequate number of viral copies (<250 copies / mL). A negative result must be combined with clinical observations, patient history, and epidemiological information.  Fact Sheet for Patients:   https://www.patel.info/  Fact Sheet for Healthcare Providers: https://hall.com/  This test is not yet approved or  cleared by the [place] FDA and has been authorized for detection and/or diagnosis of SARS-CoV-2 by FDA under an Emergency Use Authorization (EUA).  This EUA will remain in effect (meaning this test can be used) for the duration of the COVID-19 declaration under Section 564(b)(1) of the Act, 21 U.S.C. section 360bbb-3(b)(1), unless the authorization is terminated or revoked sooner.  Performed at [facility], [place] [address] [place], [place] [address]   Blood Culture (routine x 2)     Status: None (Preliminary result)   Collection Time: 04/06/22  4:30 PM   Specimen: BLOOD  Result Value Ref Range Status   Specimen Description   Final    BLOOD BLOOD RIGHT ARM Performed at [facility] [address], [place], [place] [address]    Special Requests   Final    BOTTLES DRAWN AEROBIC AND ANAEROBIC Blood Culture results may not be optimal due to an inadequate volume of blood received in culture bottles Performed at [facility] [address], [place], [place] [address]    Culture   Final    NO GROWTH 4 DAYS Performed at [facility], [place] [address], [place], [place] [address]    Report Status PENDING  Incomplete  Urine Culture     Status: None   Collection Time: 04/06/22  6:20 PM   Specimen: In/Out Cath Urine  Result Value Ref Range Status   Specimen Description   Final    IN/OUT CATH URINE Performed at [facility] [address], [place],  [place] [address]    Special Requests   Final    NONE Performed at [facility], [place] [address] [place], [place] [address]    Culture   Final    NO GROWTH Performed at [facility], [place] [address] [place], [place] [address]    Report Status 04/07/2022 FINAL  Final  Respiratory (~20 pathogens) panel by PCR     Status: None   Collection Time: 04/06/22  9:00 PM   Specimen: Nasopharyngeal Swab; Respiratory  Result Value Ref Range Status  Adenovirus NOT DETECTED NOT DETECTED Final   Coronavirus 229E NOT DETECTED NOT DETECTED Final    Comment: (NOTE) The Coronavirus on the Respiratory Panel, DOES NOT test for the novel  Coronavirus (2019 nCoV)    Coronavirus HKU1 NOT DETECTED NOT DETECTED Final   Coronavirus NL63 NOT DETECTED NOT DETECTED Final   Coronavirus OC43 NOT DETECTED NOT DETECTED Final   Metapneumovirus NOT DETECTED NOT DETECTED Final   Rhinovirus / Enterovirus NOT DETECTED NOT DETECTED Final   Influenza A NOT DETECTED NOT DETECTED Final   Influenza B NOT DETECTED NOT DETECTED Final   Parainfluenza Virus 1 NOT DETECTED NOT DETECTED Final   Parainfluenza Virus 2 NOT DETECTED NOT DETECTED Final   Parainfluenza Virus 3 NOT DETECTED NOT DETECTED Final   Parainfluenza Virus 4 NOT DETECTED NOT DETECTED Final   Respiratory Syncytial Virus NOT DETECTED NOT DETECTED Final   Bordetella pertussis NOT DETECTED NOT DETECTED Final   Bordetella Parapertussis NOT DETECTED NOT DETECTED Final   Chlamydophila pneumoniae NOT DETECTED NOT DETECTED Final   Mycoplasma pneumoniae NOT DETECTED NOT DETECTED Final    Comment: Performed at [facility], [place] [address] [place], [place] [address]    Antimicrobials: Anti-infectives (From admission, onward)    Start     Dose/Rate Route Frequency Ordered Stop   04/07/22 2000  cefTRIAXone (ROCEPHIN) 2 g in sodium chloride 0.9 % 100 mL IVPB        2 g 200 mL/hr over 30 Minutes Intravenous Every 24 hours 04/06/22 2105 04/11/22  1959   04/07/22 1030  metroNIDAZOLE (FLAGYL) IVPB 500 mg        500 mg 100 mL/hr over 60 Minutes Intravenous Every 12 hours 04/07/22 1007     04/07/22 1000  doxycycline (VIBRA-TABS) tablet 100 mg  Status:  Discontinued        100 mg Oral Every 12 hours 04/06/22 2105 04/07/22 1231   04/06/22 1945  doxycycline (VIBRA-TABS) tablet 100 mg        100 mg Oral  Once 04/06/22 1930 04/06/22 2014   04/06/22 1930  cefTRIAXone (ROCEPHIN) 1 g in sodium chloride 0.9 % 100 mL IVPB        1 g 200 mL/hr over 30 Minutes Intravenous  Once 04/06/22 1927 04/06/22 2302      Culture/Microbiology    Component Value Date/Time   SDES  04/06/2022 1820    IN/OUT CATH URINE Performed at [facility], [place] [address] [place], [place] [address]    SPECREQUEST  04/06/2022 1820    NONE Performed at [facility], [place] [address], [place], [place] [address]    CULT  04/06/2022 1820    NO GROWTH Performed at [facility] [address], [place], [place] [address]    REPTSTATUS 04/07/2022 FINAL 04/06/2022 1820    Other culture-see note  Radiology Studies: CT ABDOMEN PELVIS WO CONTRAST  Result Date: 04/08/2022 CLINICAL DATA:  Abdominal pain, possible small bowel diverticuli versus perforation, follow-up EXAM: CT ABDOMEN AND PELVIS WITHOUT CONTRAST TECHNIQUE: Multidetector CT imaging of the abdomen and pelvis was performed following the standard protocol without IV contrast. RADIATION DOSE REDUCTION: This exam was performed according to the departmental dose-optimization program which includes automated exposure control, adjustment of the mA and/or kV according to patient size and/or use of iterative reconstruction technique. COMPARISON:  CT abdomen/pelvis dated 04/07/2022 FINDINGS: Motion degraded images. Lower chest: Small bilateral pleural effusions. Associated lower lobe atelectasis. Hepatobiliary: Unenhanced liver is unremarkable. Gallbladder  is unremarkable. No  intrahepatic or extrahepatic ductal dilatation. Pancreas: Within normal limits. Spleen: Within normal limits but Adrenals/Urinary Tract: Adrenal glands are within normal limits. Kidneys are normal limits.  No renal calculi or hydronephrosis. Bladder is decompressed by an indwelling Foley catheter. Stomach/Bowel: Stomach is notable for a large hiatal hernia/inverted intrathoracic stomach. No evidence of bowel obstruction. Normal appendix (series 2/image 62). Evaluation of the mid abdomen is constrained by motion degradation. However, the suspected small bowel diverticulum in the left mid/lower abdomen (series 2/image 60) remains unchanged. However, the additional mesenteric gas collection superiorly in the left mid abdomen (series 2/image 51) demonstrates surrounding interval inflammatory changes (series 2/image 51), suggesting small bowel diverticulitis, less likely localized perforation. While contrast is passed into the colon the time of the study, there is no contrast within this collection and no intraluminal contrast is seen. Extensive left colonic diverticulosis, without evidence of diverticulitis. Vascular/Lymphatic: No evidence of abdominal aortic aneurysm. Atherosclerotic calcifications of the abdominal aorta and branch vessels. No suspicious abdominopelvic lymphadenopathy. Reproductive: Uterus is within normal limits. Bilateral ovaries are within normal limits. Other: Trace pelvic ascites (series 2/image 67).  No free air. Musculoskeletal: Prior vertebral augmentation at L1, L2, and L4. Moderate compression fracture deformity at T12, unchanged. Mild multilevel degenerative changes the visualized thoracolumbar spine. IMPRESSION: Motion degraded images. Suspected small bowel diverticulitis in the left mid/lower abdomen, with interval progression of inflammatory changes. Localized perforation is considered less likely. No extraluminal contrast is seen. No free air. Additional stable ancillary findings as  above. Electronically Signed   By: Julian Hy M.D.   On: 04/08/2022 22:43   ECHOCARDIOGRAM COMPLETE  Result Date: 04/08/2022    ECHOCARDIOGRAM REPORT   Patient Name:   [name] Date of Exam: 04/08/2022 Medical Rec #:  [id number]     Height:       66.0 in Accession #:    [id number]    Weight:       119.0 lb Date of Birth:  May 09, 1932    BSA:          1.604 m Patient Age:    36 years      BP:           137/73 mmHg Patient Gender: F             HR:           65 bpm. Exam Location:  Inpatient Procedure: 2D Echo, Cardiac Doppler and Color Doppler Indications:    Preoperative evaluation  History:        Patient has prior history of Echocardiogram examinations, most                 recent 09/04/2020. Risk Factors:Hypertension and ETOH.  Sonographer:    Bernadene Person RDCS Referring Phys: [place]  1. Left ventricular ejection fraction, by estimation, is 55 to 60%. The left ventricle has normal function. The left ventricle has no regional wall motion abnormalities. There is mild left ventricular hypertrophy of the basal-septal segment. Left ventricular diastolic parameters are consistent with Grade II diastolic dysfunction (pseudonormalization). Elevated left ventricular end-diastolic pressure.  2. Right ventricular systolic function is mildly reduced. The right ventricular size is normal. There is mildly elevated pulmonary artery systolic pressure. The estimated right ventricular systolic pressure is 95.1 mmHg.  3. Left atrial size was moderately dilated.  4. Right atrial size was moderately dilated.  5. A small pericardial effusion is present. The pericardial effusion is posterior to the  left ventricle.  6. The mitral valve is abnormal. Mild mitral valve regurgitation. No evidence of mitral stenosis. Moderate mitral annular calcification.  7. Tricuspid valve regurgitation is mild to moderate.  8. The aortic valve is tricuspid. There is mild calcification of the aortic valve. There is  mild thickening of the aortic valve. Aortic valve regurgitation is mild. Aortic valve sclerosis/calcification is present, without any evidence of aortic stenosis.  9. The inferior vena cava is normal in size with <50% respiratory variability, suggesting right atrial pressure of 8 mmHg. 10. Cannot exclude a small PFO. Comparison(s): Prior images reviewed side by side. EF appears improved compared to prior. While focal wall motion abnormalities were better visualized on prior study, no severe WMA noted on current study. FINDINGS  Left Ventricle: Left ventricular ejection fraction, by estimation, is 55 to 60%. The left ventricle has normal function. The left ventricle has no regional wall motion abnormalities. The left ventricular internal cavity size was normal in size. There is  mild left ventricular hypertrophy of the basal-septal segment. Left ventricular diastolic parameters are consistent with Grade II diastolic dysfunction (pseudonormalization). Elevated left ventricular end-diastolic pressure. Right Ventricle: The right ventricular size is normal. Right vetricular wall thickness was not well visualized. Right ventricular systolic function is mildly reduced. There is mildly elevated pulmonary artery systolic pressure. The tricuspid regurgitant velocity is 2.94 m/s, and with an assumed right atrial pressure of 8 mmHg, the estimated right ventricular systolic pressure is 33.8 mmHg. Left Atrium: Left atrial size was moderately dilated. Right Atrium: Right atrial size was moderately dilated. Pericardium: A small pericardial effusion is present. The pericardial effusion is posterior to the left ventricle. Mitral Valve: The mitral valve is abnormal. There is moderate thickening of the mitral valve leaflet(s). There is moderate calcification of the mitral valve leaflet(s). Moderate mitral annular calcification. Mild mitral valve regurgitation. No evidence of mitral valve stenosis. Tricuspid Valve: The tricuspid valve  is normal in structure. Tricuspid valve regurgitation is mild to moderate. No evidence of tricuspid stenosis. Aortic Valve: The aortic valve is tricuspid. There is mild calcification of the aortic valve. There is mild thickening of the aortic valve. Aortic valve regurgitation is mild. Aortic regurgitation PHT measures 332 msec. Aortic valve sclerosis/calcification is present, without any evidence of aortic stenosis. Pulmonic Valve: The pulmonic valve was not well visualized. Pulmonic valve regurgitation is mild. Aorta: The aortic root, ascending aorta, aortic arch and descending aorta are all structurally normal, with no evidence of dilitation or obstruction. Venous: The inferior vena cava is normal in size with less than 50% respiratory variability, suggesting right atrial pressure of 8 mmHg. IAS/Shunts: Cannot exclude a small PFO.  LEFT VENTRICLE PLAX 2D LVIDd:         4.40 cm     Diastology LVIDs:         2.50 cm     LV e' medial:    3.65 cm/s LV PW:         1.10 cm     LV E/e' medial:  35.3 LV IVS:        1.00 cm     LV e' lateral:   6.65 cm/s LVOT diam:     2.10 cm     LV E/e' lateral: 19.4 LV SV:         93 LV SV Index:   58 LVOT Area:     3.46 cm  LV Volumes (MOD) LV vol d, MOD A2C: 64.9 ml LV vol d, MOD A4C: 71.2  ml LV vol s, MOD A2C: 27.1 ml LV vol s, MOD A4C: 26.6 ml LV SV MOD A2C:     37.8 ml LV SV MOD A4C:     71.2 ml LV SV MOD BP:      44.9 ml RIGHT VENTRICLE RV S prime:     9.96 cm/s TAPSE (M-mode): 2.3 cm LEFT ATRIUM             Index        RIGHT ATRIUM           Index LA diam:        3.80 cm 2.37 cm/m   RA Area:     18.00 cm LA Vol (A2C):   64.0 ml 39.87 ml/m  RA Volume:   50.90 ml  31.73 ml/m LA Vol (A4C):   79.4 ml 49.50 ml/m LA Biplane Vol: 75.4 ml 47.00 ml/m  AORTIC VALVE LVOT Vmax:         126.00 cm/s LVOT Vmean:        81.900 cm/s LVOT VTI:          0.269 m AI PHT:            332 msec AR Vena Contracta: 0.20 cm  AORTA Ao Root diam: 3.40 cm Ao Asc diam:  3.60 cm MITRAL VALVE                 TRICUSPID VALVE MV Area (PHT): 3.60 cm     TR Peak grad:   34.6 mmHg MV Decel Time: 211 msec     TR Vmax:        294.00 cm/s MV E velocity: 129.00 cm/s MV A velocity: 131.00 cm/s  SHUNTS MV E/A ratio:  0.98         Systemic VTI:  0.27 m                             Systemic Diam: 2.10 cm Buford Dresser MD Electronically signed by Buford Dresser MD Signature Date/Time: 04/08/2022/6:04:56 PM    Final      LOS: 3 days   Antonieta Pert, MD [facility]  04/10/2022, 10:24 AM

## 2022-04-10 NOTE — Plan of Care (Signed)

## 2022-04-11 ENCOUNTER — Inpatient Hospital Stay (HOSPITAL_COMMUNITY): Payer: Medicare Other

## 2022-04-11 DIAGNOSIS — G9340 Encephalopathy, unspecified: Secondary | ICD-10-CM | POA: Diagnosis not present

## 2022-04-11 DIAGNOSIS — A419 Sepsis, unspecified organism: Secondary | ICD-10-CM | POA: Diagnosis not present

## 2022-04-11 DIAGNOSIS — R6520 Severe sepsis without septic shock: Secondary | ICD-10-CM | POA: Diagnosis not present

## 2022-04-11 DIAGNOSIS — E440 Moderate protein-calorie malnutrition: Secondary | ICD-10-CM | POA: Insufficient documentation

## 2022-04-11 LAB — CULTURE, BLOOD (ROUTINE X 2): Culture: NO GROWTH

## 2022-04-11 MED ORDER — ACETAMINOPHEN 10 MG/ML IV SOLN
1000.0000 mg | INTRAVENOUS | Status: DC
  Administered 2022-04-11: 1000 mg via INTRAVENOUS
  Filled 2022-04-11 (×5): qty 100

## 2022-04-11 MED ORDER — KETOROLAC TROMETHAMINE 15 MG/ML IJ SOLN
15.0000 mg | Freq: Once | INTRAMUSCULAR | Status: AC
  Administered 2022-04-11: 15 mg via INTRAVENOUS
  Filled 2022-04-11: qty 1

## 2022-04-11 MED ORDER — ACETAMINOPHEN 10 MG/ML IV SOLN
1000.0000 mg | Freq: Four times a day (QID) | INTRAVENOUS | Status: DC
  Administered 2022-04-11 – 2022-04-12 (×3): 1000 mg via INTRAVENOUS
  Filled 2022-04-11 (×4): qty 100

## 2022-04-11 MED ORDER — SODIUM CHLORIDE 0.9 % IV SOLN
2.0000 g | INTRAVENOUS | Status: DC
  Administered 2022-04-11: 2 g via INTRAVENOUS
  Filled 2022-04-11: qty 20

## 2022-04-11 MED ORDER — ADULT MULTIVITAMIN W/MINERALS CH
1.0000 | ORAL_TABLET | Freq: Every day | ORAL | Status: DC
  Administered 2022-04-11: 1 via ORAL
  Filled 2022-04-11: qty 1

## 2022-04-11 NOTE — Plan of Care (Signed)
Agree with palliative care. No changes from a cardiac perspective. Cardiology will sign off

## 2022-04-11 NOTE — Progress Notes (Signed)
Initial Nutrition Assessment  DOCUMENTATION CODES:   Non-severe (moderate) malnutrition in context of chronic illness  INTERVENTION:   -Placed order for 2 Chocolate Mightyshakes per meal tray  -Multivitamin with minerals daily  NUTRITION DIAGNOSIS:   Moderate Malnutrition related to chronic illness (CHF, hiatal hernia) as evidenced by moderate fat depletion, moderate muscle depletion.  GOAL:   Patient will meet greater than or equal to 90% of their needs  MONITOR:   PO intake, Supplement acceptance, Labs, Weight trends, I & O's  REASON FOR ASSESSMENT:   Consult Assessment of nutrition requirement/status  ASSESSMENT:   88 y.o. female with medical history significant of PE on eliquis, CHF, HTN, presented  to ED with onset of confusion and lethargy at home. She was found to be febrile , hypotensive ,  reports persistent cough and abdominal pain.  Patient  sleeping in room, currently alert/oriented x 1. No visitors at bedside. Per chart review, SLP evaluated 9/17 and recommended regular diet. Mainly coughs when uses straws. Consult was placed that requested Mightyshakes on meal trays, placed order in [facility].  Added daily MVI given poor POs. Consumed 25% of breakfast this morning. Requires feeding assistance.  Per weight records, no recent weight loss noted.  Medications: OSCAL w/ D, D5 infusion  Labs reviewed.  NUTRITION - FOCUSED PHYSICAL EXAM:  Flowsheet Row Most Recent Value  Orbital Region Mild depletion  Upper Arm Region Moderate depletion  Thoracic and Lumbar Region Unable to assess  Buccal Region Moderate depletion  Temple Region Moderate depletion  Clavicle Bone Region Moderate depletion  Clavicle and Acromion Bone Region Moderate depletion  Scapular Bone Region Moderate depletion  Dorsal Hand Moderate depletion  Patellar Region Unable to assess  Anterior Thigh Region Unable to assess  Posterior Calf Region Unable to assess  Edema (RD Assessment)  None  Hair Reviewed  Eyes Unable to assess  [sleeping]  Mouth Reviewed  Skin Reviewed  [red splotches on face and cheeks]       Diet Order:   Diet Order             Diet regular Room service appropriate? Yes; Fluid consistency: Thin  Diet effective now                   EDUCATION NEEDS:   Not appropriate for education at this time  Skin:  Skin Assessment: Reviewed RN Assessment  Last BM:  9/17 -type 6  Height:   Ht Readings from Last 1 Encounters:  04/07/22 '5\' 6"'$  (1.676 m)    Weight:   Wt Readings from Last 1 Encounters:  04/07/22 54 kg   BMI:  Body mass index is 19.21 kg/m.  Estimated Nutritional Needs:   Kcal:  1350-1550  Protein:  65-75g  Fluid:  1.5L/day  Clayton Bibles, MS, RD, LDN Inpatient Clinical Dietitian Contact information available via Amion

## 2022-04-11 NOTE — Progress Notes (Signed)
Patient continues to decline. Acute onset severe neck pain today- very tender to palpation. ROM severely impaired. Minimal PO intake, painful swallowing and changed vocal quality. She is extremely frail and not a good candidate for rehabilitation. She also has an intrathoracic stomach and multilevel subacute and chronic compression fractures in her L-Spine and T-Spine. I introduced the concept of hospice care.

## 2022-04-11 NOTE — Progress Notes (Signed)
PROGRESS NOTE [name]  BZJ:[id number] DOB: 11/18/1931 DOA: 04/06/2022 PCP: Burnard Bunting, MD   Brief Narrative/Hospital Course: 88 y.o. female with medical history significant of PE on eliquis, CHF, HTN, presented  to ED with onset of confusion and lethargy at home. She was found to be febrile , hypotensive ,  reports persistent cough and abdominal pain.  Initial CXR is negative. CT abd and pelvis show Small bowel loops in the left abdomen show ill-defined wall thickening with associated edema/inflammation. 3 cm collection of gas and debris identified in the left abdominal mesentery. . A second bilobed large collection of gas is identified more inferiorly in the left small bowel mesentery. This collection has no perceptible wall and contains only gas without associated fluid debris. Small bowel perforation cannot be excluded. She was also found to have stomach in a large hiatal hernia. Found to have  urinary retention and a foley catheter was placed. General surgery consulted for abnormal ct findings, was started on IV antibiotics.  Palliative care was consulted-discussed with her husband-patient changed to DNR, no desire for surgical intervention and kept on conservative management     Subjective: Seen and examined this morning she was more alert awake today Overnight afebrile BP stable BM charted yesterday X1. RN reported patient having neck pain today  Assessment and Plan: Principal Problem:   Sepsis ([place]) Active Problems:   CAP (community acquired pneumonia)   Acute encephalopathy   AV block, Mobitz 1   Essential hypertension, benign   Chronic systolic CHF (congestive heart failure) ([place])   History of pulmonary embolism   Preoperative cardiovascular examination   Sepsis with fever tachypnea hypotension Small bowel mesenteric air/fluid collection possible small bowel diverticulitis versus small bowel perforation: Suspected small bowel diverticulitis,seen by surgery family not  interested in surgical intervention. She is being managed conservatively with IV antibiotics ( started 04/07/12), IVF.continue p.o. diet speech eval completed 9/17   Neck pain: acutely today. Also with vocal quality change and painful swallowing, Getting CT  C-Spine to assess. She has had multilevel compression fractures and deformity. Cont pain control.  Large hiatal hernia-CT showed  large/inverted intrathoracic stomach: conservative management. SLP has seen. Cont on PPI  Poor oral intake/at risk of dehydration: Placed on IVF. seen by speech, augment diet and her intake has been very poor  Acute metabolic encephalopathy:appears more perked up today.  Continue provide supportive care, engage with palliative care.    Cough/fever: No pneumonia on chest x-ray cough likely from large hiatal hernia COVID-19 is negative respiratory panel negative.  Blood cultures no growth to date.  Culture supportive care  Chronic systolic and diastolic heart failure EF 45-50% and G1 DD and 2/22.Echo this admission with EF 55 to 60%, G2 DD, small pericardial effusion, cannot exclude small PFO.monitor fluid status closely while on IVFNet IO Since Admission: -2,206.23 mL [04/11/22 1135]   History of PE on Eliquis: Cont lovenox for now. Hypomagnesemia stable. Anemia likely from chronic disease: stable Hypotension BP stable. AV block, Mobitz 1-monitor.  Goals of care continue DNR, palliative care following closely  and Discussed w/ Dr Hilma Favors Continue to address goals of care patient with poor oral intake, encephalopathy,deconditioning, at risk of decompensation, prognosis remains to be seen and does not appear bright. PMT will be discussing with daughters and husband today   DVT prophylaxis: lovenox Code Status:   Code Status: DNR Family Communication: plan of care discussed with patient at bedside. I had updated patient's daughter Olivia Mackie 9/17. Called husband at home  number, no answer- he arrived to floor today and Dr  Hilma Favors discussed, daughter arriving later today and Dr Hilma Favors meeting with them. Discussed with Dr. Hilma Favors.  Patient status is: Inpatient because of sepsis acute diverticulitis Level of care:Telemetry  Dispo:Anticipated disposition: TBD  Objective: Vitals last 24 hrs: Vitals:   04/10/22 1207 04/10/22 2028 04/11/22 0614 04/11/22 0620  BP: 130/60 136/61 138/70   Pulse: (!) 56 70 70   Resp: '18 20 20 19  '$ Temp: 97.6 F (36.4 C) 97.6 F (36.4 C) 98.2 F (36.8 C)   TempSrc: Oral Oral Oral   SpO2: 97% 92% (!) 87% 95%  Weight:      Height:       Physical Examination: General exam: AA,frail elderly. HEENT:Oral mucosa moist, Ear/Nose WNL grossly, dentition normal. Respiratory system:Bilaterally diminished,no use of accessory muscle. Cardiovascular system:S1 & S2 +, No JVD. Gastrointestinal system:Abdomen soft,NT,ND,BS+. Nervous System:Alert, awake, moving extremities and grossly non-focal. Extremities: LE ankle edema neg, distal peripheral pulses palpable.  Skin: No rashes,no icterus. MSK: Normal muscle bulk,tone, power. Tender in neck palpation  Medications reviewed:  Scheduled Meds:  calcium-vitamin D  1 tablet Oral Daily   Chlorhexidine Gluconate Cloth  6 each Topical Daily   enoxaparin (LOVENOX) injection  1 mg/kg Subcutaneous Q12H   escitalopram  20 mg Oral Daily   gabapentin  300 mg Oral QID   multivitamin with minerals  1 tablet Oral Daily   pantoprazole (PROTONIX) IV  40 mg Intravenous Q24H   tamsulosin  0.4 mg Oral Daily  Continuous Infusions:  cefTRIAXone (ROCEPHIN)  IV     dextrose 5 % and 0.9% NaCl 50 mL/hr at 04/11/22 0905   metronidazole 500 mg (04/10/22 2134)    Diet Order             Diet regular Room service appropriate? Yes; Fluid consistency: Thin  Diet effective now                  Unresulted Labs (From admission, onward)    None     Data Reviewed: I have personally reviewed following labs and imaging studies CBC: Recent Labs  Lab  04/06/22 1629 04/07/22 0354 04/08/22 0517 04/09/22 0054  WBC 12.4* 11.3* 7.3 6.8  NEUTROABS 8.9*  --   --   --   HGB 12.5 12.7 11.4* 10.7*  HCT 38.3 38.1 34.0* 31.9*  MCV 93.4 92.7 92.6 92.5  PLT 168 147* 130* 161*  Basic Metabolic Panel: Recent Labs  Lab 04/06/22 1629 04/07/22 0523 04/08/22 0517 04/09/22 0054  NA 138 136  --  136  K 3.8 3.7  --  3.6  CL 105 103  --  104  CO2 25 25  --  21*  GLUCOSE 106* 96  --  73  BUN 19 18  --  18  CREATININE 0.65 0.66  --  0.61  CALCIUM 9.0 9.0  --  8.3*  MG 1.6*  --  1.7  --   GFR: Estimated Creatinine Clearance: 42.2 mL/min (by C-G formula based on SCr of 0.61 mg/dL). Liver Function Tests: Recent Labs  Lab 04/06/22 1629 04/07/22 0523  AST 31 23  ALT 24 20  ALKPHOS 63 66  BILITOT 1.5* 1.6*  PROT 6.6 6.4*  ALBUMIN 3.5 3.2*   No results for input(s): "LIPASE", "AMYLASE" in the last 168 hours. No results for input(s): "AMMONIA" in the last 168 hours. Coagulation Profile: Recent Labs  Lab 04/06/22 1629  INR 1.5*   Recent Labs  Lab  04/06/22 1629  LATICACIDVEN 0.8  Antimicrobials: Anti-infectives (From admission, onward)    Start     Dose/Rate Route Frequency Ordered Stop   04/11/22 2000  cefTRIAXone (ROCEPHIN) 2 g in sodium chloride 0.9 % 100 mL IVPB        2 g 200 mL/hr over 30 Minutes Intravenous Every 24 hours 04/11/22 0956 04/14/22 1959   04/07/22 2000  cefTRIAXone (ROCEPHIN) 2 g in sodium chloride 0.9 % 100 mL IVPB        2 g 200 mL/hr over 30 Minutes Intravenous Every 24 hours 04/06/22 2105 04/11/22 0904   04/07/22 1030  metroNIDAZOLE (FLAGYL) IVPB 500 mg        500 mg 100 mL/hr over 60 Minutes Intravenous Every 12 hours 04/07/22 1007 04/14/22 0955   04/07/22 1000  doxycycline (VIBRA-TABS) tablet 100 mg  Status:  Discontinued        100 mg Oral Every 12 hours 04/06/22 2105 04/07/22 1231   04/06/22 1945  doxycycline (VIBRA-TABS) tablet 100 mg        100 mg Oral  Once 04/06/22 1930 04/06/22 2014   04/06/22  1930  cefTRIAXone (ROCEPHIN) 1 g in sodium chloride 0.9 % 100 mL IVPB        1 g 200 mL/hr over 30 Minutes Intravenous  Once 04/06/22 1927 04/06/22 2302     Culture/Microbiology    Component Value Date/Time   SDES  04/06/2022 1820    IN/OUT CATH URINE Performed at [facility], [place] [address] [place], [place] [address]    SPECREQUEST  04/06/2022 1820    NONE Performed at [facility], [place] [address] [place], [place] [address]    CULT  04/06/2022 1820    NO GROWTH Performed at [facility] [address] [place], [place] [address]    REPTSTATUS 04/07/2022 FINAL 04/06/2022 1820  Radiology Studies: No results found.  LOS: 4 days  Antonieta Pert, MD [facility] 04/11/2022, 11:35 AM

## 2022-04-11 NOTE — Care Management Important Message (Signed)
Important Message  Patient Details IM Letter placed in Patients room. Name: [name] MRN: [id number] Date of Birth: 1932/07/02   Medicare Important Message Given:  Yes     Kerin Salen 04/11/2022, 10:38 AM

## 2022-04-11 NOTE — NC FL2 (Signed)
[place] LEVEL OF CARE SCREENING TOOL     IDENTIFICATION  Patient Name: [name] Birthdate: 24-Sep-1931 Sex: female Admission Date (Current Location): 04/06/2022  [facility] and [place] Number:  Herbalist and Address:  [facility],  [place] [address] [place]      Provider Number: [id number]  Attending Physician Name and Address:  Antonieta Pert, MD  Relative Name and Phone Number:  [place], [place] [phone]    Current Level of Care: Hospital Recommended Level of Care: [place] Prior Approval Number:    Date Approved/Denied:   PASRR Number: [id number]  Discharge Plan: SNF    Current Diagnoses: Patient Active Problem List   Diagnosis Date Noted   Preoperative cardiovascular examination    CAP (community acquired pneumonia) 04/06/2022   Sepsis ([place]) 04/06/2022   Acute encephalopathy 04/06/2022   AV block, Mobitz 1 41/96/2229   Chronic systolic CHF (congestive heart failure) ([place]) 09/15/2020   History of pulmonary embolism 09/15/2020   History of DVT (deep vein thrombosis) 09/15/2020   Major depression in remission ([place]) 09/15/2020   Coronary artery disease involving native coronary artery of native heart without angina pectoris 09/15/2020   Acute urinary retention 09/15/2020   Displaced intertrochanteric fracture of left femur, initial encounter for closed fracture ([place]) 09/02/2020   Non-ST elevation (NSTEMI) myocardial infarction [facility])    Closed fracture of distal end of left femur, initial encounter ([place]) 11/01/2019   Other fracture of left femur, initial encounter for closed fracture ([place]) 11/01/2019   Hip fracture ([place]) 11/01/2019   Postprocedural hypotension    NSVT (nonsustained ventricular tachycardia) ([place]) 09/26/2018   Bradycardia    Displaced supracondylar fracture of distal end of right femur without intracondylar extension ([place]) 05/04/2018   Postop check    Pain    Femur fracture,  right ([place]) 05/02/2018   Prolonged QT interval 05/02/2018   Bigeminy 05/02/2018   Left ventricular diastolic dysfunction with preserved systolic function 79/89/2119   PAT (paroxysmal atrial tachycardia) ([facility]) 41/74/0814   Diastolic dysfunction without heart failure 03/24/2016   Peripheral venous insufficiency 03/24/2016   Closed wedge compression fracture of fourth lumbar vertebra ([facility])    Near syncope 10/20/2014   Orthostatic hypotension 10/20/2014   Fatigue 10/19/2014   Syncope 10/19/2014   Generalized weakness    Elevated troponin    Dyspnea on exertion    Rib fracture 09/05/2014   HX: breast cancer 09/26/2013   Depression 11/20/2012   Drug-induced constipation 11/20/2012   Pure hypercholesterolemia 11/20/2012   Essential hypertension, benign 11/20/2012   Postoperative anemia due to acute blood loss 11/01/2012   Hyponatremia 11/01/2012   Hypokalemia 11/01/2012   OA (osteoarthritis) of knee 10/29/2012   Septic joint of right knee joint ([place]) 05/23/2012   Elevated liver enzymes 05/23/2012    Orientation RESPIRATION BLADDER Height & Weight     Self  Normal Incontinent, External catheter Weight: 119 lb 0.8 oz (54 kg) Height:  '5\' 6"'$  (167.6 cm)  BEHAVIORAL SYMPTOMS/MOOD NEUROLOGICAL BOWEL NUTRITION STATUS      Incontinent Diet (Thin liquids)  AMBULATORY STATUS COMMUNICATION OF NEEDS Skin   Limited Assist Verbally Normal                       Personal Care Assistance Level of Assistance  Bathing, Feeding, Dressing Bathing Assistance: Limited assistance Feeding assistance: Independent Dressing Assistance: Limited assistance     Functional Limitations Info  Sight, Hearing, Speech Sight Info: Adequate  Hearing Info: Adequate Speech Info: Adequate    SPECIAL CARE FACTORS FREQUENCY  PT (By licensed PT), OT (By licensed OT)     PT Frequency: 5x/wk OT Frequency: 5x/wk            Contractures Contractures Info: Not present    Additional Factors Info  Code  Status, Allergies, Psychotropic Code Status Info: DNR Allergies Info: Oysters (Shellfish Allergy), Codeine, Penicillins Psychotropic Info: See MAR         Current Medications (04/11/2022):  This is the current hospital active medication list Current Facility-Administered Medications  Medication Dose Route Frequency Provider Last Rate Last Admin   calcium-vitamin D (OSCAL WITH D) 500-5 MG-MCG per tablet 1 tablet  1 tablet Oral Daily Jennette Kettle M, DO   1 tablet at 04/07/22 1035   cefTRIAXone (ROCEPHIN) 2 g in sodium chloride 0.9 % 100 mL IVPB  2 g Intravenous Q24H Kc, Ramesh, MD       Chlorhexidine Gluconate Cloth 2 % PADS 6 each  6 each Topical Daily Hosie Poisson, MD   6 each at 04/11/22 0904   dextrose 5 %-0.9 % sodium chloride infusion   Intravenous Continuous Kc, Maren Beach, MD 50 mL/hr at 04/11/22 0905 Infusion Verify at 04/11/22 0905   enoxaparin (LOVENOX) injection 55 mg  1 mg/kg Subcutaneous Q12H Kc, Maren Beach, MD   55 mg at 04/10/22 2132   escitalopram (LEXAPRO) tablet 20 mg  20 mg Oral Daily Jennette Kettle M, DO   20 mg at 04/07/22 1006   gabapentin (NEURONTIN) capsule 300 mg  300 mg Oral QID Jennette Kettle M, DO   300 mg at 04/10/22 2133   metroNIDAZOLE (FLAGYL) IVPB 500 mg  500 mg Intravenous Q12H Kc, Maren Beach, MD 100 mL/hr at 04/10/22 2134 500 mg at 04/10/22 2134   morphine (PF) 2 MG/ML injection 2-4 mg  2-4 mg Intravenous Q4H PRN Etta Quill, DO   2 mg at 04/08/22 2212   pantoprazole (PROTONIX) injection 40 mg  40 mg Intravenous Q24H Hosie Poisson, MD   40 mg at 04/10/22 1743   senna-docusate (Senokot-S) tablet 1 tablet  1 tablet Oral QHS PRN Etta Quill, DO       tamsulosin [facility]) capsule 0.4 mg  0.4 mg Oral Daily Alcario Drought, Jared M, DO   0.4 mg at 04/07/22 1006     Discharge Medications: Please see discharge summary for a list of discharge medications.  Relevant Imaging Results:  Relevant Lab Results:   Additional Information SSN [id number] Patient has received  covid vaccine and booster shot  Vassie Moselle, LCSW

## 2022-04-11 NOTE — Plan of Care (Signed)
Pt alert and oriented to self. Pt compliant with meds. No choking or signs of aspiration noted with gabapentin. No prns given. Sat 87% at 20 degree hob. Increased hob sats went up to 95%.  Problem: Education: Goal: Knowledge of General Education information will improve Description: Including pain rating scale, medication(s)/side effects and non-pharmacologic comfort measures Outcome: Progressing   Problem: Health Behavior/Discharge Planning: Goal: Ability to manage health-related needs will improve Outcome: Progressing   Problem: Clinical Measurements: Goal: Ability to maintain clinical measurements within normal limits will improve Outcome: Progressing Goal: Will remain free from infection Outcome: Progressing Goal: Diagnostic test results will improve Outcome: Progressing Goal: Respiratory complications will improve Outcome: Progressing Goal: Cardiovascular complication will be avoided Outcome: Progressing   Problem: Activity: Goal: Risk for activity intolerance will decrease Outcome: Progressing   Problem: Nutrition: Goal: Adequate nutrition will be maintained Outcome: Progressing   Problem: Coping: Goal: Level of anxiety will decrease Outcome: Progressing   Problem: Elimination: Goal: Will not experience complications related to bowel motility Outcome: Progressing Goal: Will not experience complications related to urinary retention Outcome: Progressing   Problem: Pain Managment: Goal: General experience of comfort will improve Outcome: Progressing   Problem: Safety: Goal: Ability to remain free from injury will improve Outcome: Progressing   Problem: Skin Integrity: Goal: Risk for impaired skin integrity will decrease Outcome: Progressing

## 2022-04-11 NOTE — TOC Initial Note (Signed)
Transition of Care [facility]) - Initial/Assessment Note    Patient Details  Name: [name] MRN: [id number] Date of Birth: Feb 22, 1932  Transition of Care [facility]) CM/SW Contact:    Vassie Moselle, LCSW Phone Number: 04/11/2022, 10:22 AM  Clinical Narrative:                 Pt currently recommended for SNF placement. CSW will continue to follow for further recommendations from Palliative Care team to determine if discharge plans will change.  CSW spoke with pt's husband and discussed current recommendations. Pt spouse states this pt has never been to SNF before and had many questions about what this type of facility is. CSW answered pt's spouses questions. He also requested to speak with MD. MD notified.  Pt has been worked up for SNF and referred out for placement. Currently awaiting bed offers.   Expected Discharge Plan: Skilled Nursing Facility Barriers to Discharge: Continued Medical Work up   Patient Goals and CMS Choice Patient states their goals for this hospitalization and ongoing recovery are:: To return home   Choice offered to / list presented to : Spouse  Expected Discharge Plan and Services Expected Discharge Plan: [place] In-house Referral: Hospice / Palliative Care Discharge Planning Services: NA Post Acute Care Choice: [place] Living arrangements for the past 2 months: Single Family Home                 DME Arranged: N/A DME Agency: NA                  Prior Living Arrangements/Services Living arrangements for the past 2 months: Single Family Home Lives with:: Spouse Patient language and need for interpreter reviewed:: Yes Do you feel safe going back to the place where you live?: Yes      Need for Family Participation in Patient Care: Yes (Comment) Care giver support system in place?: No (comment) Current home services: DME Criminal Activity/Legal Involvement Pertinent to Current Situation/Hospitalization: No -  Comment as needed  Activities of Daily Living Home Assistive Devices/Equipment: Environmental consultant (specify type), Eyeglasses, Shower chair with back, Raised toilet seat with rails (reading glasses) ADL Screening (condition at time of admission) Patient's cognitive ability adequate to safely complete daily activities?: No Is the patient deaf or have difficulty hearing?: No Does the patient have difficulty seeing, even when wearing glasses/contacts?: No Does the patient have difficulty concentrating, remembering, or making decisions?: Yes Patient able to express need for assistance with ADLs?: Yes Does the patient have difficulty dressing or bathing?: Yes Independently performs ADLs?: No Communication: Independent Dressing (OT): Needs assistance Is this a change from baseline?: Pre-admission baseline Grooming: Independent Feeding: Independent Bathing: Needs assistance Is this a change from baseline?: Pre-admission baseline Toileting: Needs assistance Is this a change from baseline?: Pre-admission baseline In/Out Bed: Needs assistance Is this a change from baseline?: Pre-admission baseline Walks in Home: Needs assistance Is this a change from baseline?: Pre-admission baseline Does the patient have difficulty walking or climbing stairs?: Yes Weakness of Legs: Both Weakness of Arms/Hands: Both  Permission Sought/Granted Permission sought to share information with : Family Supports Permission granted to share information with : Yes, Verbal Permission Granted  Share Information with NAME: Haeleigh Streiff     Permission granted to share info w Relationship: Spouse  Permission granted to share info w Contact Information: [phone]  Emotional Assessment Appearance:: Appears stated age Attitude/Demeanor/Rapport: Unable to Assess Affect (typically observed): Unable to Assess Orientation: : Oriented to  Self Alcohol / Substance Use: Not Applicable Psych Involvement: No (comment)  Admission  diagnosis:  Delirium [R41.0] Weakness [R53.1] CAP (community acquired pneumonia) [J18.9] Sepsis without acute organ dysfunction, due to unspecified organism ([place]) [A41.9] Sepsis ([place]) [A41.9] Patient Active Problem List   Diagnosis Date Noted   Preoperative cardiovascular examination    CAP (community acquired pneumonia) 04/06/2022   Sepsis ([place]) 04/06/2022   Acute encephalopathy 04/06/2022   AV block, Mobitz 1 16/04/9603   Chronic systolic CHF (congestive heart failure) ([place]) 09/15/2020   History of pulmonary embolism 09/15/2020   History of DVT (deep vein thrombosis) 09/15/2020   Major depression in remission ([place]) 09/15/2020   Coronary artery disease involving native coronary artery of native heart without angina pectoris 09/15/2020   Acute urinary retention 09/15/2020   Displaced intertrochanteric fracture of left femur, initial encounter for closed fracture ([place]) 09/02/2020   Non-ST elevation (NSTEMI) myocardial infarction [facility])    Closed fracture of distal end of left femur, initial encounter ([place]) 11/01/2019   Other fracture of left femur, initial encounter for closed fracture ([place]) 11/01/2019   Hip fracture ([address]) 11/01/2019   Postprocedural hypotension    NSVT (nonsustained ventricular tachycardia) ([place]) 09/26/2018   Bradycardia    Displaced supracondylar fracture of distal end of right femur without intracondylar extension ([place]) 05/04/2018   Postop check    Pain    Femur fracture, right ([place]) 05/02/2018   Prolonged QT interval 05/02/2018   [place] 05/02/2018   Left ventricular diastolic dysfunction with preserved systolic function 54/03/8118   PAT (paroxysmal atrial tachycardia) (HCC) 14/78/2956   Diastolic dysfunction without heart failure 03/24/2016   Peripheral venous insufficiency 03/24/2016   Closed wedge compression fracture of fourth lumbar vertebra ([place])    Near syncope 10/20/2014   Orthostatic hypotension 10/20/2014   Fatigue 10/19/2014   Syncope 10/19/2014    Generalized weakness    Elevated troponin    Dyspnea on exertion    Rib fracture 09/05/2014   HX: breast cancer 09/26/2013   Depression 11/20/2012   Drug-induced constipation 11/20/2012   Pure hypercholesterolemia 11/20/2012   Essential hypertension, benign 11/20/2012   Postoperative anemia due to acute blood loss 11/01/2012   Hyponatremia 11/01/2012   Hypokalemia 11/01/2012   OA (osteoarthritis) of knee 10/29/2012   Septic joint of right knee joint ([place]) 05/23/2012   Elevated liver enzymes 05/23/2012   PCP:  Burnard Bunting, MD Pharmacy:   [facility] [place], [place] - [address] [place] [address] Phone: [phone] Fax: [phone]  [id number]: [phone]: [phone]    Social Determinants of Health (SDOH) Interventions    Readmission Risk Interventions    04/11/2022   10:19 AM 11/04/2019    5:00 PM  Readmission Risk Prevention Plan  Post Dischage Appt Complete   Medication Screening Complete   Transportation Screening Complete Complete  PCP or Specialist Appt within 5-7 Days  Complete  Home Care Screening  Complete  Medication Review (RN CM)  Complete

## 2022-04-11 NOTE — Plan of Care (Signed)

## 2022-04-12 DIAGNOSIS — A419 Sepsis, unspecified organism: Secondary | ICD-10-CM | POA: Diagnosis not present

## 2022-04-12 DIAGNOSIS — Z515 Encounter for palliative care: Secondary | ICD-10-CM | POA: Diagnosis not present

## 2022-04-12 DIAGNOSIS — Z7189 Other specified counseling: Secondary | ICD-10-CM

## 2022-04-12 DIAGNOSIS — R6520 Severe sepsis without septic shock: Secondary | ICD-10-CM | POA: Diagnosis not present

## 2022-04-12 DIAGNOSIS — G9340 Encephalopathy, unspecified: Secondary | ICD-10-CM | POA: Diagnosis not present

## 2022-04-12 MED ORDER — OLANZAPINE 5 MG PO TBDP: 5.0000 mg | ORAL_TABLET | Freq: Two times a day (BID) | ORAL | Status: DC | PRN

## 2022-04-12 MED ORDER — KETOROLAC TROMETHAMINE 15 MG/ML IJ SOLN: 15.0000 mg | Freq: Four times a day (QID) | INTRAMUSCULAR | Status: DC | PRN

## 2022-04-12 MED ORDER — KETOROLAC TROMETHAMINE 15 MG/ML IJ SOLN
15.0000 mg | Freq: Four times a day (QID) | INTRAMUSCULAR | Status: DC
  Administered 2022-04-12 – 2022-04-13 (×5): 15 mg via INTRAVENOUS
  Filled 2022-04-12 (×5): qty 1

## 2022-04-12 MED ORDER — MORPHINE SULFATE (PF) 2 MG/ML IV SOLN: 2.0000 mg | INTRAVENOUS | Status: DC | PRN

## 2022-04-12 MED ORDER — ACETAMINOPHEN 10 MG/ML IV SOLN
1000.0000 mg | Freq: Four times a day (QID) | INTRAVENOUS | Status: AC
  Administered 2022-04-12 – 2022-04-13 (×3): 1000 mg via INTRAVENOUS
  Filled 2022-04-12 (×4): qty 100

## 2022-04-12 NOTE — Plan of Care (Signed)

## 2022-04-12 NOTE — TOC Progression Note (Signed)
Transition of Care [facility]) - Progression Note    Patient Details  Name: [name] MRN: [id number] Date of Birth: 01-09-1932  Transition of Care [facility]) CM/SW [place], [place] Phone Number: 04/12/2022, 9:22 AM  Clinical Narrative:    Pt recommended for Residential Hospice. CSW spoke with pt's spouse who is agreeable to this plan and has chosen [facility]. CSW has contacted [facility] to refer pt for [employer].    Expected Discharge Plan: [place] Barriers to Discharge: Continued Medical Work up  Expected Discharge Plan and Services Expected Discharge Plan: [place] In-house Referral: Hospice / Palliative Care Discharge Planning Services: NA Post Acute Care Choice: [place] Living arrangements for the past 2 months: Single Family Home                 DME Arranged: N/A DME Agency: NA                   Social Determinants of Health (SDOH) Interventions    Readmission Risk Interventions    04/11/2022   10:19 AM 11/04/2019    5:00 PM  Readmission Risk Prevention Plan  Post Dischage Appt Complete   Medication Screening Complete   Transportation Screening Complete Complete  PCP or Specialist Appt within 5-7 Days  Complete  Home Care Screening  Complete  Medication Review (RN CM)  Complete

## 2022-04-12 NOTE — Progress Notes (Addendum)
Manufacturing engineer [facility]) Hospital Liaison Note  Referral received for patient/family interest in [facility]. Chart under review by [facility] physician.   Hospice eligibility confirmed.   Family would like to wait until tomorrow to move her. [place] liaison will follow up with family tomorrow 9.20.23  Please call with any questions or concerns. Thank you  Roselee Nova, [facility] Liaison  [phone]

## 2022-04-12 NOTE — Progress Notes (Signed)
PROGRESS NOTE SHARLYNE KOENEMAN  EUM:[id number] DOB: 04-26-1932 DOA: 04/06/2022 PCP: Burnard Bunting, MD   Brief Narrative/Hospital Course: 88 y.o. female with medical history significant of PE on eliquis, CHF, HTN, presented  to ED with onset of confusion and lethargy at home. She was found to be febrile , hypotensive ,  reports persistent cough and abdominal pain.  Initial CXR is negative. CT abd and pelvis show Small bowel loops in the left abdomen show ill-defined wall thickening with associated edema/inflammation. 3 cm collection of gas and debris identified in the left abdominal mesentery. . A second bilobed large collection of gas is identified more inferiorly in the left small bowel mesentery. This collection has no perceptible wall and contains only gas without associated fluid debris. Small bowel perforation cannot be excluded. She was also found to have stomach in a large hiatal hernia. Found to have  urinary retention and a foley catheter was placed. General surgery consulted for abnormal ct findings, was started on IV antibiotics.  Palliative care was consulted-discussed with her husband-patient changed to DNR, no desire for surgical intervention and kept on conservative management. She was being managed conservatively with IV antibiotics, IVF> antibiotics IV fluids discontinued and transitioned to comfort measures by PMT 9/18 with supportive care pain medication/diet. Speech eval completed 9/17. Cont comfort measure.     Subjective: Seen and examined. Resting comfortably this morning pain improved after Toradol yesterday.  Much more alert awake. Was confused this morning per nursing staff  Assessment and Plan: Principal Problem:   Sepsis ([place]) Active Problems:   CAP (community acquired pneumonia)   Acute encephalopathy   AV block, Mobitz 1   Essential hypertension, benign   Chronic systolic CHF (congestive heart failure) ([place])   History of pulmonary embolism   Preoperative  cardiovascular examination   Malnutrition of moderate degree   Goals of care Comfort measure/EOL care: palliative care following closely  and Discussed w/ Dr Carylon Perches team continue current plan.  Sepsis with fever tachypnea hypotension Small bowel mesenteric air/fluid collection possible small bowel diverticulitis versus small bowel perforation Large hiatal hernia/inverted intrathoracic stomach: Suspected small bowel diverticulitis,seen by surgery family not interested in surgical intervention. She was being managed conservatively with IV antibiotics, IVF> antibiotics IV fluids discontinued and transitioned to comfort measures by PMT 9/18 with supportive care pain medication/diet. Speech eval completed 9/17   Neck pain: acutely today. Also with vocal quality change and painful swallowing, CT  C-Spine no acute finding.  Continue IV Toradol/pain control. She has had multilevel compression fractures and deformity. Cont pain control.  Poor oral intake/at risk of dehydration: seen by speech, augment diet and her intake has been very poor  Acute metabolic encephalopathy: Remains intermittently confused but alert awake conversant.Continue provide supportive care, engage with palliative care.    Cough/fever: No pneumonia on chest x-ray cough likely from large hiatal hernia COVID-19 is negative respiratory panel negative.  Blood cultures no growth to date.  Culture supportive care  Chronic systolic and diastolic heart failure EF 45-50% and G1 DD and 2/22.Echo this admission with EF 55 to 60%, G2 DD, small pericardial effusion, cannot exclude small PFO.monitor fluid status closely while on IVFNet IO Since Admission: -1,394.74 mL [04/12/22 1005]   History of PE on Eliquis: OFF lovenox for COMFORT Hypomagnesemia stable. Anemia likely from chronic disease: stable Hypotension BP stable. AV block, Mobitz 1-monitor.cardio signed off.  DVT prophylaxis: scd. Code Status:   Code Status: DNR Family  Communication: plan of care discussed with patient at  bedside. PMT discussed w/ patient and family at bedside and changedt ocomfort 9/18  Patient status is: Inpatient for EOL care Level of care:Med-Surg  Dispo:Anticipated disposition: TBD-hospice  Objective: Vitals last 24 hrs: Vitals:   04/11/22 0620 04/11/22 1606 04/11/22 2007 04/12/22 0523  BP:  122/68 100/63 113/64  Pulse:  71 68 (!) 52  Resp: '19 18 20 18  '$ Temp:  98.2 F (36.8 C) 97.6 F (36.4 C) 97.7 F (36.5 C)  TempSrc:  Oral Oral Oral  SpO2: 95% 91% 93% 93%  Weight:      Height:       Physical Examination: General exam: Aao to self, elderly, frail older than stated age, weak appearing. HEENT:Oral mucosa moist, Ear/Nose WNL grossly, dentition normal. Respiratory system: bilaterally diminished, no use of accessory muscle Cardiovascular system: S1 & S2 +, No JVD,. Gastrointestinal system: Abdomen soft,NT,ND,BS+ Nervous System:Alert, awake, moving extremities and grossly nonfocal Extremities: LE ankle edema neg, distal peripheral pulses palpable.  Skin: No rashes,no icterus. MSK: Normal muscle bulk,tone, power   Medications reviewed:  Scheduled Meds:  Chlorhexidine Gluconate Cloth  6 each Topical Daily   escitalopram  20 mg Oral Daily   ketorolac  15 mg Intravenous Q6H   pantoprazole (PROTONIX) IV  40 mg Intravenous Q24H   tamsulosin  0.4 mg Oral Daily  Continuous Infusions:  acetaminophen 1,000 mg (04/12/22 0537)   dextrose 5 % and 0.9% NaCl 10 mL/hr at 04/12/22 8921    Diet Order             Diet regular Room service appropriate? Yes; Fluid consistency: Thin  Diet effective now                  Unresulted Labs (From admission, onward)    None     Data Reviewed: I have personally reviewed following labs and imaging studies CBC: Recent Labs  Lab 04/06/22 1629 04/07/22 0354 04/08/22 0517 04/09/22 0054  WBC 12.4* 11.3* 7.3 6.8  NEUTROABS 8.9*  --   --   --   HGB 12.5 12.7 11.4* 10.7*  HCT 38.3  38.1 34.0* 31.9*  MCV 93.4 92.7 92.6 92.5  PLT 168 147* 130* 194*  Basic Metabolic Panel: Recent Labs  Lab 04/06/22 1629 04/07/22 0523 04/08/22 0517 04/09/22 0054  NA 138 136  --  136  K 3.8 3.7  --  3.6  CL 105 103  --  104  CO2 25 25  --  21*  GLUCOSE 106* 96  --  73  BUN 19 18  --  18  CREATININE 0.65 0.66  --  0.61  CALCIUM 9.0 9.0  --  8.3*  MG 1.6*  --  1.7  --   GFR: Estimated Creatinine Clearance: 42.2 mL/min (by C-G formula based on SCr of 0.61 mg/dL). Liver Function Tests: Recent Labs  Lab 04/06/22 1629 04/07/22 0523  AST 31 23  ALT 24 20  ALKPHOS 63 66  BILITOT 1.5* 1.6*  PROT 6.6 6.4*  ALBUMIN 3.5 3.2*   No results for input(s): "LIPASE", "AMYLASE" in the last 168 hours. No results for input(s): "AMMONIA" in the last 168 hours. Coagulation Profile: Recent Labs  Lab 04/06/22 1629  INR 1.5*   Recent Labs  Lab 04/06/22 1629  LATICACIDVEN 0.8  Antimicrobials: Anti-infectives (From admission, onward)    Start     Dose/Rate Route Frequency Ordered Stop   04/11/22 2000  cefTRIAXone (ROCEPHIN) 2 g in sodium chloride 0.9 % 100 mL IVPB  Status:  Discontinued        2 g 200 mL/hr over 30 Minutes Intravenous Every 24 hours 04/11/22 0956 04/12/22 0631   04/07/22 2000  cefTRIAXone (ROCEPHIN) 2 g in sodium chloride 0.9 % 100 mL IVPB        2 g 200 mL/hr over 30 Minutes Intravenous Every 24 hours 04/06/22 2105 04/11/22 0904   04/07/22 1030  metroNIDAZOLE (FLAGYL) IVPB 500 mg  Status:  Discontinued        500 mg 100 mL/hr over 60 Minutes Intravenous Every 12 hours 04/07/22 1007 04/12/22 0635   04/07/22 1000  doxycycline (VIBRA-TABS) tablet 100 mg  Status:  Discontinued        100 mg Oral Every 12 hours 04/06/22 2105 04/07/22 1231   04/06/22 1945  doxycycline (VIBRA-TABS) tablet 100 mg        100 mg Oral  Once 04/06/22 1930 04/06/22 2014   04/06/22 1930  cefTRIAXone (ROCEPHIN) 1 g in sodium chloride 0.9 % 100 mL IVPB        1 g 200 mL/hr over 30 Minutes  Intravenous  Once 04/06/22 1927 04/06/22 2302     Culture/Microbiology    Component Value Date/Time   SDES  04/06/2022 1820    IN/OUT CATH URINE Performed at [facility], [place] [address] [place], [place] [address]    SPECREQUEST  04/06/2022 1820    NONE Performed at [facility], [place] [address] [place], [place] [address]    CULT  04/06/2022 1820    NO GROWTH Performed at [facility] [address], [place], [place] [address]    REPTSTATUS 04/07/2022 FINAL 04/06/2022 1820  Radiology Studies: CT CERVICAL SPINE WO CONTRAST  Result Date: 04/11/2022 CLINICAL DATA:  Acute onset severe neck pain EXAM: CT CERVICAL SPINE WITHOUT CONTRAST TECHNIQUE: Multidetector CT imaging of the cervical spine was performed without intravenous contrast. Multiplanar CT image reconstructions were also generated. RADIATION DOSE REDUCTION: This exam was performed according to the departmental dose-optimization program which includes automated exposure control, adjustment of the mA and/or kV according to patient size and/or use of iterative reconstruction technique. COMPARISON:  10/31/2019 FINDINGS: Alignment: Exaggeration of the normal cervical lordosis. No listhesis. Skull base and vertebrae: Unchanged unfused remote odontoid fracture. Degenerative changes at C1-C2 with partial fusion of C1 and C2 at the anterior right aspect of the arch. No acute fracture or suspicious osseous lesion. Unchanged mild compression deformity of C7. Soft tissues and spinal canal: No prevertebral fluid or swelling. No visible canal hematoma. Disc levels:  No high-grade spinal canal stenosis. Upper chest: No focal pulmonary opacity or pleural effusion. Apical pleural-parenchymal scarring. Other: None. IMPRESSION: No acute fracture or traumatic listhesis. Redemonstrated remote unfused odontoid fracture. Electronically Signed   By: Merilyn Baba M.D.   On: 04/11/2022 15:42    LOS: 5 days  Antonieta Pert, MD [facility] 04/12/2022, 10:05 AM

## 2022-04-12 NOTE — Progress Notes (Addendum)
Palliative Medicine Inpatient Follow Up Note HPI: 88 yo woman with dementia, falls at home, hx PE on anticoaglation, multi-level chronic compression fractures and kyphscoliosis, hx of breast cancer and melanoma, CHF, malnutrition and failure to thrive admitted with AMS and abdominal pain. Found to have very large intrathoracic hiatal hernia, probable small bowel perforation with multiple areas of gas a fluid collection in her abdomen and not gelt to be a a good surgical candidate based on comorbidity and frailty. She was started on IV antibiotics and had some minor improvement, but care plan not entirely clear. Palliative consulted for goals of care and symptom management.  Today's Discussion 04/12/2022  *Please note that this is a verbal dictation therefore any spelling or grammatical errors are due to the "[place] [facility]" system interpretation.  Chart reviewed inclusive of vital signs, progress notes, laboratory results, and diagnostic images. Patient required no additional IV morphine or zyprexa overnight.   I met with [name] at bedside this afternoon. She was resting comfortably. She did not initially arouse to me though her RN, Katha Cabal was able to come to bedside and provide additional support. [name] was able to awaken. She shares that she was sleeping. She expresses that she has been comfortable throughout the morning and at this time is in no pain.   [name] and I discussed the plan from here which she is aware of. Created space and opportunity for patient to explore thoughts feelings and fears regarding current medical situation. She shares with me the importance and strength of her husband in her life. She vocalizes that he is still working in his advanced age. She talks about her children and grandchildren. She shares that she is very proud of her grandson who is a Microbiologist.   I was able to call patients spouse after leaving the room. He and I reviewed the continued goals  of comfort. We discussed the plan for transition to [facility] when a bed is available. He was a bit surprised about the plan for transition -->  I summarize the conversation from yesterday per my note review, he then vocalizes that he recalls this and does agree to it. We discussed that transfer could happen as soon as a bed is available.   Questions and concerns addressed/Palliative Support Provided.  __________________________________ Addendum:  I met with patient's spouse, [name] at bedside this evening.  We reviewed the plan for patient to transition to inpatient hospice.  He is at this point in time on board though remains quite forgetful over prior conversations.  When patient's daughters were called they deferred to their father for decision-making.  Plan for transition to [facility] tomorrow.  Objective Assessment: Vital Signs Vitals:   04/11/22 2007 04/12/22 0523  BP: 100/63 113/64  Pulse: 68 (!) 52  Resp: 20 18  Temp: 97.6 F (36.4 C) 97.7 F (36.5 C)  SpO2: 93% 93%    Intake/Output Summary (Last 24 hours) at 04/12/2022 1242 Last data filed at 04/12/2022 5329 Gross per 24 hour  Intake 1461.49 ml  Output 650 ml  Net 811.49 ml   Last Weight  Most recent update: 04/07/2022  2:26 AM    Weight  54 kg (119 lb 0.8 oz)            Gen:  Elderly Caucasian F in NAD HEENT: moist mucous membranes CV: Regular rate and rhythm  PULM: ON RA, breathing even and nonlabored ABD: soft/nontender EXT: Pedal edema Neuro: Alert and oriented x2-3  SUMMARY  OF RECOMMENDATIONS   DNAR/DNI  Continue focus on comfort  Tordol and tylenol are effectively controlling discomfort  Transition to [employer] once a bed is available  Ongoing Palliative Support  Billing based on MDM: High  Problems Addressed: One acute or chronic illness or injury that poses a threat to life or bodily function  Amount and/or Complexity of Data: Category 3:Discussion of management or test  interpretation with external physician/other qualified health care professional/appropriate source (not separately reported)  Risks: Parenteral controlled substances and Decision not to resuscitate or to de-escalate care because of poor prognosis ______________________________________________________________________________________ [place] Team Team Cell Phone: [phone] Please utilize secure chat with additional questions, if there is no response within 30 minutes please call the above phone number  Palliative Medicine Team providers are available by phone from 7am to 7pm daily and can be reached through the team cell phone.  Should this patient require assistance outside of these hours, please call the patient's attending physician.

## 2022-04-13 DIAGNOSIS — R6520 Severe sepsis without septic shock: Secondary | ICD-10-CM | POA: Diagnosis not present

## 2022-04-13 DIAGNOSIS — G9340 Encephalopathy, unspecified: Secondary | ICD-10-CM | POA: Diagnosis not present

## 2022-04-13 DIAGNOSIS — A419 Sepsis, unspecified organism: Secondary | ICD-10-CM | POA: Diagnosis not present

## 2022-04-13 DIAGNOSIS — Z7189 Other specified counseling: Secondary | ICD-10-CM | POA: Diagnosis not present

## 2022-04-13 DIAGNOSIS — I5022 Chronic systolic (congestive) heart failure: Secondary | ICD-10-CM | POA: Diagnosis not present

## 2022-04-13 MED ORDER — ORAL CARE MOUTH RINSE
15.0000 mL | OROMUCOSAL | Status: DC
  Administered 2022-04-13: 15 mL via OROMUCOSAL

## 2022-04-13 MED ORDER — ORAL CARE MOUTH RINSE: 15.0000 mL | OROMUCOSAL | Status: DC | PRN

## 2022-04-13 NOTE — Discharge Summary (Signed)
Physician Discharge Summary  [name] NGE:[id number] DOB: 02-22-1932 DOA: 04/06/2022  PCP: Burnard Bunting, MD  Admit date: 04/06/2022 Discharge date: 04/13/2022 Recommendations for Outpatient Follow-up:  Follow up with hospice  Discharge Dispo: hospice Discharge Condition: Stable Code Status:   Code Status: DNR Diet recommendation:  Diet Order             Diet regular Room service appropriate? Yes; Fluid consistency: Thin  Diet effective now                    Brief/Interim Summary: 88 y.o. female with medical history significant of PE on eliquis, CHF, HTN, presented  to ED with onset of confusion and lethargy at home. She was found to be febrile , hypotensive ,  reports persistent cough and abdominal pain.  Initial CXR is negative. CT abd and pelvis show Small bowel loops in the left abdomen show ill-defined wall thickening with associated edema/inflammation. 3 cm collection of gas and debris identified in the left abdominal mesentery. . A second bilobed large collection of gas is identified more inferiorly in the left small bowel mesentery. This collection has no perceptible wall and contains only gas without associated fluid debris. Small bowel perforation cannot be excluded. She was also found to have stomach in a large hiatal hernia. Found to have  urinary retention and a foley catheter was placed. General surgery consulted for abnormal ct findings, was started on IV antibiotics.  Palliative care was consulted-discussed with her husband-patient changed to DNR, no desire for surgical intervention and kept on conservative management. She was being managed conservatively with IV antibiotics, IVF> antibiotics IV fluids discontinued and transitioned to comfort measures by PMT 9/18 with supportive care pain medication/diet. Speech eval completed 9/17. Cont comfort measure.    Discharge Diagnoses:  Principal Problem:   Sepsis ([place]) Active Problems:   CAP (community acquired  pneumonia)   Acute encephalopathy   AV block, Mobitz 1   Essential hypertension, benign   Chronic systolic CHF (congestive heart failure) ([place])   History of pulmonary embolism   Preoperative cardiovascular examination   Malnutrition of moderate degree  Goals of care Comfort measure/EOL care: palliative care following closely  and Discussed w/ Dr Carylon Perches team continue current plan. Plan is to discharge to [place] today   Sepsis with fever tachypnea hypotension Small bowel mesenteric air/fluid collection possible small bowel diverticulitis versus small bowel perforation Large hiatal hernia/inverted intrathoracic stomach: Suspected small bowel diverticulitis,seen by surgery family not interested in surgical intervention. She was being managed conservatively with IV antibiotics, IVF> antibiotics IV fluids discontinued and transitioned to comfort measures by PMT 9/18 with supportive care pain medication/diet. Speech eval completed 9/17    Neck pain: acutely today. Also with vocal quality change and painful swallowing, CT  C-Spine no acute finding..  Improved with Toradol continue oral regimen.She has had multilevel compression fractures and deformity. Cont pain control.   Poor oral intake/at risk of dehydration: seen by speech, augment diet and her intake has been very poor   Acute metabolic encephalopathy: Remains intermittently confused but alert awake conversant.Continue provide supportive care.  She has been more alert awake and communicative although intermittently confused   Cough/fever: No pneumonia on chest x-ray cough likely from large hiatal hernia COVID-19 is negative respiratory panel negative.  Blood cultures no growth to date.   Chronic systolic and diastolic heart failure EF 45-50% and G1 DD and 2/22.Echo this admission with EF 55 to 60%, G2 DD, small pericardial  effusion, cannot exclude small PFO.monitor fluid status closely while on IVFNet IO Since Admission: -1,394.74  mL [04/12/22 1005]    History of PE on Eliquis: OFF lovenox for COMFORT Hypomagnesemia stable. Anemia likely from chronic disease: stable Hypotension BP stable. AV block, Mobitz 1-monitor.cardio signed off.  Consults: Palliative with, cardiology Subjective: Alert awake resting comfortably communicating, knows that she is going to [place]  Discharge Exam: Vitals:   04/11/22 2007 04/12/22 0523  BP: 100/63 113/64  Pulse: 68 (!) 52  Resp: 20 18  Temp: 97.6 F (36.4 C) 97.7 F (36.5 C)  SpO2: 93% 93%   General: Pt is alert, awake, not in acute distress Cardiovascular: RRR, S1/S2 +, no rubs, no gallops Respiratory: CTA bilaterally, no wheezing, no rhonchi Abdominal: Soft, NT, ND, bowel sounds + Extremities: no edema, no cyanosis  Discharge Instructions  Discharge Instructions     Discharge instructions   Complete by: As directed    Follow-up with hospice/[facility] placed today      Allergies as of 04/13/2022       Reactions   Oysters [shellfish Allergy] Nausea And Vomiting   Codeine Nausea And Vomiting   Penicillins Rash        Medication List     STOP taking these medications    alendronate 70 MG tablet Commonly known as: FOSAMAX   apixaban 5 MG Tabs tablet Commonly known as: ELIQUIS   Calcium-D 600-400 MG-UNIT Tabs   escitalopram 20 MG tablet Commonly known as: LEXAPRO   furosemide 40 MG tablet Commonly known as: LASIX   gabapentin 300 MG capsule Commonly known as: NEURONTIN   losartan 25 MG tablet Commonly known as: COZAAR   Multivitamin Adult Tabs   rosuvastatin 20 MG tablet Commonly known as: CRESTOR   tamsulosin 0.4 MG Caps capsule Commonly known as: FLOMAX       TAKE these medications    acetaminophen 325 MG tablet Commonly known as: TYLENOL Take 2 tablets (650 mg total) by mouth every 6 (six) hours as needed for mild pain or moderate pain.   bisacodyl 10 MG suppository Commonly known as: DULCOLAX Place 10 mg  rectally daily as needed for moderate constipation.   Ensure Take 237 mLs by mouth daily in the afternoon.   omeprazole 40 MG capsule Commonly known as: PRILOSEC Take 1 capsule (40 mg total) by mouth daily.   senna-docusate 8.6-50 MG tablet Commonly known as: Senokot-S Take 1 tablet by mouth at bedtime. What changed:  when to take this reasons to take this        Allergies  Allergen Reactions   Oysters [Shellfish Allergy] Nausea And Vomiting   Codeine Nausea And Vomiting   Penicillins Rash    The results of significant diagnostics from this hospitalization (including imaging, microbiology, ancillary and laboratory) are listed below for reference.    Microbiology: Recent Results (from the past 240 hour(s))  SARS Coronavirus 2 by RT PCR (hospital order, performed in [facility] hospital lab) *cepheid single result test* Anterior Nasal Swab     Status: None   Collection Time: 04/06/22  4:28 PM   Specimen: Anterior Nasal Swab  Result Value Ref Range Status   SARS Coronavirus 2 by RT PCR NEGATIVE NEGATIVE Final    Comment: (NOTE) SARS-CoV-2 target nucleic acids are NOT DETECTED.  The SARS-CoV-2 RNA is generally detectable in upper and lower respiratory specimens during the acute phase of infection. The lowest concentration of SARS-CoV-2 viral copies this assay can detect is 250 copies /  mL. A negative result does not preclude SARS-CoV-2 infection and should not be used as the sole basis for treatment or other patient management decisions.  A negative result may occur with improper specimen collection / handling, submission of specimen other than nasopharyngeal swab, presence of viral mutation(s) within the areas targeted by this assay, and inadequate number of viral copies (<250 copies / mL). A negative result must be combined with clinical observations, patient history, and epidemiological information.  Fact Sheet for Patients:    https://www.patel.info/  Fact Sheet for Healthcare Providers: https://hall.com/  This test is not yet approved or  cleared by the [place] FDA and has been authorized for detection and/or diagnosis of SARS-CoV-2 by FDA under an Emergency Use Authorization (EUA).  This EUA will remain in effect (meaning this test can be used) for the duration of the COVID-19 declaration under Section 564(b)(1) of the Act, 21 U.S.C. section 360bbb-3(b)(1), unless the authorization is terminated or revoked sooner.  Performed at [facility], [place] [address], [place], [place] [address]   Blood Culture (routine x 2)     Status: None   Collection Time: 04/06/22  4:30 PM   Specimen: BLOOD  Result Value Ref Range Status   Specimen Description   Final    BLOOD BLOOD RIGHT ARM Performed at [facility] [address] [place], [place] [address]    Special Requests   Final    BOTTLES DRAWN AEROBIC AND ANAEROBIC Blood Culture results may not be optimal due to an inadequate volume of blood received in culture bottles Performed at [facility] [address] [place], [place] [address]    Culture   Final    NO GROWTH 5 DAYS Performed at [facility], [place] [address] [place], [place] [address]    Report Status 04/11/2022 FINAL  Final  Urine Culture     Status: None   Collection Time: 04/06/22  6:20 PM   Specimen: In/Out Cath Urine  Result Value Ref Range Status   Specimen Description   Final    IN/OUT CATH URINE Performed at [facility] [address] [place], [place] [address]    Special Requests   Final    NONE Performed at [facility], [place] [address] [place], [place] [address]    Culture   Final    NO GROWTH Performed at [facility], [place] [address], [place], [place] [address]    Report Status 04/07/2022 FINAL  Final  Respiratory (~20  pathogens) panel by PCR     Status: None   Collection Time: 04/06/22  9:00 PM   Specimen: Nasopharyngeal Swab; Respiratory  Result Value Ref Range Status   Adenovirus NOT DETECTED NOT DETECTED Final   Coronavirus 229E NOT DETECTED NOT DETECTED Final    Comment: (NOTE) The Coronavirus on the Respiratory Panel, DOES NOT test for the novel  Coronavirus (2019 nCoV)    Coronavirus HKU1 NOT DETECTED NOT DETECTED Final   Coronavirus NL63 NOT DETECTED NOT DETECTED Final   Coronavirus OC43 NOT DETECTED NOT DETECTED Final   Metapneumovirus NOT DETECTED NOT DETECTED Final   Rhinovirus / Enterovirus NOT DETECTED NOT DETECTED Final   Influenza A NOT DETECTED NOT DETECTED Final   Influenza B NOT DETECTED NOT DETECTED Final   Parainfluenza Virus 1 NOT DETECTED NOT DETECTED Final   Parainfluenza Virus 2 NOT DETECTED NOT DETECTED Final   Parainfluenza Virus 3 NOT DETECTED NOT DETECTED  Final   Parainfluenza Virus 4 NOT DETECTED NOT DETECTED Final   Respiratory Syncytial Virus NOT DETECTED NOT DETECTED Final   Bordetella pertussis NOT DETECTED NOT DETECTED Final   Bordetella Parapertussis NOT DETECTED NOT DETECTED Final   Chlamydophila pneumoniae NOT DETECTED NOT DETECTED Final   Mycoplasma pneumoniae NOT DETECTED NOT DETECTED Final    Comment: Performed at [facility], [place] [address], [place], [place] [address]    Procedures/Studies: CT CERVICAL SPINE WO CONTRAST  Result Date: 04/11/2022 CLINICAL DATA:  Acute onset severe neck pain EXAM: CT CERVICAL SPINE WITHOUT CONTRAST TECHNIQUE: Multidetector CT imaging of the cervical spine was performed without intravenous contrast. Multiplanar CT image reconstructions were also generated. RADIATION DOSE REDUCTION: This exam was performed according to the departmental dose-optimization program which includes automated exposure control, adjustment of the mA and/or kV according to patient size and/or use of iterative reconstruction technique. COMPARISON:   10/31/2019 FINDINGS: Alignment: Exaggeration of the normal cervical lordosis. No listhesis. Skull base and vertebrae: Unchanged unfused remote odontoid fracture. Degenerative changes at C1-C2 with partial fusion of C1 and C2 at the anterior right aspect of the arch. No acute fracture or suspicious osseous lesion. Unchanged mild compression deformity of C7. Soft tissues and spinal canal: No prevertebral fluid or swelling. No visible canal hematoma. Disc levels:  No high-grade spinal canal stenosis. Upper chest: No focal pulmonary opacity or pleural effusion. Apical pleural-parenchymal scarring. Other: None. IMPRESSION: No acute fracture or traumatic listhesis. Redemonstrated remote unfused odontoid fracture. Electronically Signed   By: Merilyn Baba M.D.   On: 04/11/2022 15:42   CT ABDOMEN PELVIS WO CONTRAST  Result Date: 04/08/2022 CLINICAL DATA:  Abdominal pain, possible small bowel diverticuli versus perforation, follow-up EXAM: CT ABDOMEN AND PELVIS WITHOUT CONTRAST TECHNIQUE: Multidetector CT imaging of the abdomen and pelvis was performed following the standard protocol without IV contrast. RADIATION DOSE REDUCTION: This exam was performed according to the departmental dose-optimization program which includes automated exposure control, adjustment of the mA and/or kV according to patient size and/or use of iterative reconstruction technique. COMPARISON:  CT abdomen/pelvis dated 04/07/2022 FINDINGS: Motion degraded images. Lower chest: Small bilateral pleural effusions. Associated lower lobe atelectasis. Hepatobiliary: Unenhanced liver is unremarkable. Gallbladder is unremarkable. No intrahepatic or extrahepatic ductal dilatation. Pancreas: Within normal limits. Spleen: Within normal limits but Adrenals/Urinary Tract: Adrenal glands are within normal limits. Kidneys are normal limits.  No renal calculi or hydronephrosis. Bladder is decompressed by an indwelling [place] catheter. Stomach/Bowel: Stomach is  notable for a large hiatal hernia/inverted intrathoracic stomach. No evidence of bowel obstruction. Normal appendix (series 2/image 62). Evaluation of the mid abdomen is constrained by motion degradation. However, the suspected small bowel diverticulum in the left mid/lower abdomen (series 2/image 60) remains unchanged. However, the additional mesenteric gas collection superiorly in the left mid abdomen (series 2/image 51) demonstrates surrounding interval inflammatory changes (series 2/image 51), suggesting small bowel diverticulitis, less likely localized perforation. While contrast is passed into the colon the time of the study, there is no contrast within this collection and no intraluminal contrast is seen. Extensive left colonic diverticulosis, without evidence of diverticulitis. Vascular/Lymphatic: No evidence of abdominal aortic aneurysm. Atherosclerotic calcifications of the abdominal aorta and branch vessels. No suspicious abdominopelvic lymphadenopathy. Reproductive: Uterus is within normal limits. Bilateral ovaries are within normal limits. Other: Trace pelvic ascites (series 2/image 67).  No free air. Musculoskeletal: Prior vertebral augmentation at L1, L2, and L4. Moderate compression fracture deformity at T12, unchanged. Mild multilevel degenerative changes the visualized  thoracolumbar spine. IMPRESSION: Motion degraded images. Suspected small bowel diverticulitis in the left mid/lower abdomen, with interval progression of inflammatory changes. Localized perforation is considered less likely. No extraluminal contrast is seen. No free air. Additional stable ancillary findings as above. Electronically Signed   By: Julian Hy M.D.   On: 04/08/2022 22:43   ECHOCARDIOGRAM COMPLETE  Result Date: 04/08/2022    ECHOCARDIOGRAM REPORT   Patient Name:   [name] Date of Exam: 04/08/2022 Medical Rec #:  [id number]     Height:       66.0 in Accession #:    [id number]    Weight:       119.0 lb Date of  Birth:  1932-01-20    BSA:          1.604 m Patient Age:    63 years      BP:           137/73 mmHg Patient Gender: F             HR:           65 bpm. Exam Location:  Inpatient Procedure: 2D Echo, Cardiac Doppler and Color Doppler Indications:    Preoperative evaluation  History:        Patient has prior history of Echocardiogram examinations, most                 recent 09/04/2020. Risk Factors:Hypertension and ETOH.  Sonographer:    Bernadene Person RDCS Referring Phys: [place]  1. Left ventricular ejection fraction, by estimation, is 55 to 60%. The left ventricle has normal function. The left ventricle has no regional wall motion abnormalities. There is mild left ventricular hypertrophy of the basal-septal segment. Left ventricular diastolic parameters are consistent with Grade II diastolic dysfunction (pseudonormalization). Elevated left ventricular end-diastolic pressure.  2. Right ventricular systolic function is mildly reduced. The right ventricular size is normal. There is mildly elevated pulmonary artery systolic pressure. The estimated right ventricular systolic pressure is 29.7 mmHg.  3. Left atrial size was moderately dilated.  4. Right atrial size was moderately dilated.  5. A small pericardial effusion is present. The pericardial effusion is posterior to the left ventricle.  6. The mitral valve is abnormal. Mild mitral valve regurgitation. No evidence of mitral stenosis. Moderate mitral annular calcification.  7. Tricuspid valve regurgitation is mild to moderate.  8. The aortic valve is tricuspid. There is mild calcification of the aortic valve. There is mild thickening of the aortic valve. Aortic valve regurgitation is mild. Aortic valve sclerosis/calcification is present, without any evidence of aortic stenosis.  9. The inferior vena cava is normal in size with <50% respiratory variability, suggesting right atrial pressure of 8 mmHg. 10. Cannot exclude a small PFO.  Comparison(s): Prior images reviewed side by side. EF appears improved compared to prior. While focal wall motion abnormalities were better visualized on prior study, no severe WMA noted on current study. FINDINGS  Left Ventricle: Left ventricular ejection fraction, by estimation, is 55 to 60%. The left ventricle has normal function. The left ventricle has no regional wall motion abnormalities. The left ventricular internal cavity size was normal in size. There is  mild left ventricular hypertrophy of the basal-septal segment. Left ventricular diastolic parameters are consistent with Grade II diastolic dysfunction (pseudonormalization). Elevated left ventricular end-diastolic pressure. Right Ventricle: The right ventricular size is normal. Right vetricular wall thickness was not well visualized. Right ventricular systolic function is mildly reduced. There  is mildly elevated pulmonary artery systolic pressure. The tricuspid regurgitant velocity is 2.94 m/s, and with an assumed right atrial pressure of 8 mmHg, the estimated right ventricular systolic pressure is 44.0 mmHg. Left Atrium: Left atrial size was moderately dilated. Right Atrium: Right atrial size was moderately dilated. Pericardium: A small pericardial effusion is present. The pericardial effusion is posterior to the left ventricle. Mitral Valve: The mitral valve is abnormal. There is moderate thickening of the mitral valve leaflet(s). There is moderate calcification of the mitral valve leaflet(s). Moderate mitral annular calcification. Mild mitral valve regurgitation. No evidence of mitral valve stenosis. Tricuspid Valve: The tricuspid valve is normal in structure. Tricuspid valve regurgitation is mild to moderate. No evidence of tricuspid stenosis. Aortic Valve: The aortic valve is tricuspid. There is mild calcification of the aortic valve. There is mild thickening of the aortic valve. Aortic valve regurgitation is mild. Aortic regurgitation PHT measures  332 msec. Aortic valve sclerosis/calcification is present, without any evidence of aortic stenosis. Pulmonic Valve: The pulmonic valve was not well visualized. Pulmonic valve regurgitation is mild. Aorta: The aortic root, ascending aorta, aortic arch and descending aorta are all structurally normal, with no evidence of dilitation or obstruction. Venous: The inferior vena cava is normal in size with less than 50% respiratory variability, suggesting right atrial pressure of 8 mmHg. IAS/Shunts: Cannot exclude a small PFO.  LEFT VENTRICLE PLAX 2D LVIDd:         4.40 cm     Diastology LVIDs:         2.50 cm     LV e' medial:    3.65 cm/s LV PW:         1.10 cm     LV E/e' medial:  35.3 LV IVS:        1.00 cm     LV e' lateral:   6.65 cm/s LVOT diam:     2.10 cm     LV E/e' lateral: 19.4 LV SV:         93 LV SV Index:   58 LVOT Area:     3.46 cm  LV Volumes (MOD) LV vol d, MOD A2C: 64.9 ml LV vol d, MOD A4C: 71.2 ml LV vol s, MOD A2C: 27.1 ml LV vol s, MOD A4C: 26.6 ml LV SV MOD A2C:     37.8 ml LV SV MOD A4C:     71.2 ml LV SV MOD BP:      44.9 ml RIGHT VENTRICLE RV S prime:     9.96 cm/s TAPSE (M-mode): 2.3 cm LEFT ATRIUM             Index        RIGHT ATRIUM           Index LA diam:        3.80 cm 2.37 cm/m   RA Area:     18.00 cm LA Vol (A2C):   64.0 ml 39.87 ml/m  RA Volume:   50.90 ml  31.73 ml/m LA Vol (A4C):   79.4 ml 49.50 ml/m LA Biplane Vol: 75.4 ml 47.00 ml/m  AORTIC VALVE LVOT Vmax:         126.00 cm/s LVOT Vmean:        81.900 cm/s LVOT VTI:          0.269 m AI PHT:            332 msec AR Vena Contracta: 0.20 cm  AORTA Ao Root diam: 3.40 cm  Ao Asc diam:  3.60 cm MITRAL VALVE                TRICUSPID VALVE MV Area (PHT): 3.60 cm     TR Peak grad:   34.6 mmHg MV Decel Time: 211 msec     TR Vmax:        294.00 cm/s MV E velocity: 129.00 cm/s MV A velocity: 131.00 cm/s  SHUNTS MV E/A ratio:  0.98         Systemic VTI:  0.27 m                             Systemic Diam: 2.10 cm Buford Dresser MD  Electronically signed by Buford Dresser MD Signature Date/Time: 04/08/2022/6:04:56 PM    Final    CT ABDOMEN PELVIS W CONTRAST  Result Date: 04/07/2022 CLINICAL DATA:  Left lower quadrant pain. EXAM: CT ABDOMEN AND PELVIS WITH CONTRAST TECHNIQUE: Multidetector CT imaging of the abdomen and pelvis was performed using the standard protocol following bolus administration of intravenous contrast. RADIATION DOSE REDUCTION: This exam was performed according to the departmental dose-optimization program which includes automated exposure control, adjustment of the mA and/or kV according to patient size and/or use of iterative reconstruction technique. CONTRAST:  121m OMNIPAQUE IOHEXOL 300 MG/ML  SOLN COMPARISON:  None Available. FINDINGS: Lower chest: Large hiatal hernia with 75-100% of the stomach contained in the chest. Bibasilar collapse/consolidation with small bilateral pleural effusions. Hepatobiliary: No suspicious focal abnormality within the liver parenchyma. There is no evidence for gallstones, gallbladder wall thickening, or pericholecystic fluid. No intrahepatic or extrahepatic biliary dilation. Pancreas: No focal mass lesion. No dilatation of the main duct. No intraparenchymal cyst. No peripancreatic edema. Spleen: No splenomegaly. No focal mass lesion. Adrenals/Urinary Tract: No adrenal nodule or mass. Cortical scarring noted upper pole right kidney. Kidneys otherwise unremarkable. No evidence for hydroureter. Bladder is markedly distended measuring 19.1 x 16.4 x 12.9 cm. Stomach/Bowel: Large hiatal hernia, as above. Duodenum is normally positioned as is the ligament of Treitz. Small bowel loops in the left abdomen show ill-defined wall thickening with perienteric edema/inflammation. Two discrete collections of gas and debris are identified. One of these is visible on axial 36/2 and coronal 60/5 measuring about 3 cm in size. A second bilobed collection of gas is identified more inferiorly (image  40/2 and coronal 54/5). This collection appears more thin walled and contains only gas without associated fluid debris. Numerous scattered small bowel diverticuli are identified. Terminal ileum not well visualized. The appendix is best seen on coronal images and is unremarkable. No gross colonic mass. No colonic wall thickening. Diverticuli are seen scattered along the entire length of the colon without CT findings of diverticulitis. Vascular/Lymphatic: There is moderate atherosclerotic calcification of the abdominal aorta without aneurysm. There is no gastrohepatic or hepatoduodenal ligament lymphadenopathy. No retroperitoneal or mesenteric lymphadenopathy. No pelvic sidewall lymphadenopathy. Reproductive: The uterus is unremarkable.  There is no adnexal mass. Other: No intraperitoneal free fluid. Musculoskeletal: Bones are diffusely demineralized. Fixation hardware noted in the left femoral neck and proximal right femur. Multilevel thoracolumbar compression deformity evident with prior vertebral augmentation at L1, L2, and L4. IMPRESSION: 1. Small bowel loops in the left abdomen show ill-defined wall thickening with associated edema/inflammation. 3 cm collection of gas and debris identified in the left abdominal mesentery. Given that there is other diverticular disease in the small bowel, this is probably an enlarged, thick-walled inflamed diverticulum. However, small bowel  perforation contained in the adjacent mesentery could have a similar appearance and is not excluded on this study. 2. A second bilobed large collection of gas is identified more inferiorly in the left small bowel mesentery. This collection has no perceptible wall and contains only gas without associated fluid debris. This is felt to represent a patulous small bowel diverticulum although extraluminal gas collection is not excluded. 3. Marked distention of the urinary bladder. Correlation for bladder dysmotility or outlet obstruction recommended.  4. Large hiatal hernia with 75-100% of the stomach contained in the chest. 5. Bibasilar collapse/consolidation with small bilateral pleural effusions. 6. Diffuse colonic diverticulosis without diverticulitis. 7. Multilevel thoracolumbar compression deformity with prior vertebral augmentation at L1, L2, and L4. 8. Aortic Atherosclerosis (ICD10-I70.0). Electronically Signed   By: Misty Stanley M.D.   On: 04/07/2022 08:07   DG Chest Port 1 View  Result Date: 04/06/2022 CLINICAL DATA:  Possible sepsis, weakness, confusion EXAM: PORTABLE CHEST 1 VIEW COMPARISON:  Previous studies including the chest radiograph done on 09/04/2020 FINDINGS: Transverse diameter of heart is increased. There is soft tissue fullness in the retrocardiac region suggesting possible fixed hiatal hernia. There are no signs of alveolar pulmonary edema or focal pulmonary consolidation. There is no pleural effusion or pneumothorax. Surgical clips are seen in left axilla. Deformities are noted in multiple left ribs with no significant interval change suggesting old fractures. Degenerative changes are noted in both shoulders, more so on the right side. IMPRESSION: Cardiomegaly. There are no signs of pulmonary edema or focal pulmonary consolidation. Possible fixed hiatal hernia. Electronically Signed   By: Elmer Picker M.D.   On: 04/06/2022 16:40    Labs: BNP (last 3 results) No results for input(s): "BNP" in the last 8760 hours. Basic Metabolic Panel: Recent Labs  Lab 04/06/22 1629 04/07/22 0523 04/08/22 0517 04/09/22 0054  NA 138 136  --  136  K 3.8 3.7  --  3.6  CL 105 103  --  104  CO2 25 25  --  21*  GLUCOSE 106* 96  --  73  BUN 19 18  --  18  CREATININE 0.65 0.66  --  0.61  CALCIUM 9.0 9.0  --  8.3*  MG 1.6*  --  1.7  --    Liver Function Tests: Recent Labs  Lab 04/06/22 1629 04/07/22 0523  AST 31 23  ALT 24 20  ALKPHOS 63 66  BILITOT 1.5* 1.6*  PROT 6.6 6.4*  ALBUMIN 3.5 3.2*   No results for input(s):  "LIPASE", "AMYLASE" in the last 168 hours. No results for input(s): "AMMONIA" in the last 168 hours. CBC: Recent Labs  Lab 04/06/22 1629 04/07/22 0354 04/08/22 0517 04/09/22 0054  WBC 12.4* 11.3* 7.3 6.8  NEUTROABS 8.9*  --   --   --   HGB 12.5 12.7 11.4* 10.7*  HCT 38.3 38.1 34.0* 31.9*  MCV 93.4 92.7 92.6 92.5  PLT 168 147* 130* 147*   Cardiac Enzymes: No results for input(s): "CKTOTAL", "CKMB", "CKMBINDEX", "TROPONINI" in the last 168 hours. BNP: Invalid input(s): "POCBNP" CBG: No results for input(s): "GLUCAP" in the last 168 hours. D-Dimer No results for input(s): "DDIMER" in the last 72 hours. Hgb A1c No results for input(s): "HGBA1C" in the last 72 hours. Lipid Profile No results for input(s): "CHOL", "HDL", "LDLCALC", "TRIG", "CHOLHDL", "LDLDIRECT" in the last 72 hours. Thyroid function studies No results for input(s): "TSH", "T4TOTAL", "T3FREE", "THYROIDAB" in the last 72 hours.  Invalid input(s): "FREET3" Anemia work up  No results for input(s): "VITAMINB12", "FOLATE", "FERRITIN", "TIBC", "IRON", "RETICCTPCT" in the last 72 hours. Urinalysis    Component Value Date/Time   COLORURINE YELLOW 04/06/2022 1820   APPEARANCEUR CLEAR 04/06/2022 1820   LABSPEC 1.015 04/06/2022 1820   PHURINE 7.0 04/06/2022 1820   GLUCOSEU NEGATIVE 04/06/2022 1820   HGBUR NEGATIVE 04/06/2022 1820   BILIRUBINUR NEGATIVE 04/06/2022 1820   KETONESUR NEGATIVE 04/06/2022 1820   PROTEINUR NEGATIVE 04/06/2022 1820   UROBILINOGEN 1.0 10/19/2014 1450   NITRITE NEGATIVE 04/06/2022 1820   LEUKOCYTESUR NEGATIVE 04/06/2022 1820   Sepsis Labs Recent Labs  Lab 04/06/22 1629 04/07/22 0354 04/08/22 0517 04/09/22 0054  WBC 12.4* 11.3* 7.3 6.8   Microbiology Recent Results (from the past 240 hour(s))  SARS Coronavirus 2 by RT PCR (hospital order, performed in [place] hospital lab) *cepheid single result test* Anterior Nasal Swab     Status: None   Collection Time: 04/06/22  4:28 PM    Specimen: Anterior Nasal Swab  Result Value Ref Range Status   SARS Coronavirus 2 by RT PCR NEGATIVE NEGATIVE Final    Comment: (NOTE) SARS-CoV-2 target nucleic acids are NOT DETECTED.  The SARS-CoV-2 RNA is generally detectable in upper and lower respiratory specimens during the acute phase of infection. The lowest concentration of SARS-CoV-2 viral copies this assay can detect is 250 copies / mL. A negative result does not preclude SARS-CoV-2 infection and should not be used as the sole basis for treatment or other patient management decisions.  A negative result may occur with improper specimen collection / handling, submission of specimen other than nasopharyngeal swab, presence of viral mutation(s) within the areas targeted by this assay, and inadequate number of viral copies (<250 copies / mL). A negative result must be combined with clinical observations, patient history, and epidemiological information.  Fact Sheet for Patients:   https://www.patel.info/  Fact Sheet for Healthcare Providers: https://hall.com/  This test is not yet approved or  cleared by the [place] FDA and has been authorized for detection and/or diagnosis of SARS-CoV-2 by FDA under an Emergency Use Authorization (EUA).  This EUA will remain in effect (meaning this test can be used) for the duration of the COVID-19 declaration under Section 564(b)(1) of the Act, 21 U.S.C. section 360bbb-3(b)(1), unless the authorization is terminated or revoked sooner.  Performed at [facility], [place] [address], [place], [place] [address]   Blood Culture (routine x 2)     Status: None   Collection Time: 04/06/22  4:30 PM   Specimen: BLOOD  Result Value Ref Range Status   Specimen Description   Final    BLOOD BLOOD RIGHT ARM Performed at [facility] [address], [place], [place] [address]    Special Requests   Final    BOTTLES  DRAWN AEROBIC AND ANAEROBIC Blood Culture results may not be optimal due to an inadequate volume of blood received in culture bottles Performed at [facility] [address], [place], [place] [address]    Culture   Final    NO GROWTH 5 DAYS Performed at [facility], [place] [address], [place], [place] [address]    Report Status 04/11/2022 FINAL  Final  Urine Culture     Status: None   Collection Time: 04/06/22  6:20 PM   Specimen: In/Out Cath Urine  Result Value Ref Range Status   Specimen Description   Final    IN/OUT CATH URINE Performed at [facility], [place] [address], [place], [place] [address]    Special Requests   Final    NONE Performed at [facility], [place] [address] [place], [place] [address]    Culture   Final    NO GROWTH Performed at [facility], [place] [address] [place], [place] [address]    Report Status 04/07/2022 FINAL  Final  Respiratory (~20 pathogens) panel by PCR     Status: None   Collection Time: 04/06/22  9:00 PM   Specimen: Nasopharyngeal Swab; Respiratory  Result Value Ref Range Status   Adenovirus NOT DETECTED NOT DETECTED Final   Coronavirus 229E NOT DETECTED NOT DETECTED Final    Comment: (NOTE) The Coronavirus on the Respiratory Panel, DOES NOT test for the novel  Coronavirus (2019 nCoV)    Coronavirus HKU1 NOT DETECTED NOT DETECTED Final   Coronavirus NL63 NOT DETECTED NOT DETECTED Final   Coronavirus OC43 NOT DETECTED NOT DETECTED Final   Metapneumovirus NOT DETECTED NOT DETECTED Final   Rhinovirus / Enterovirus NOT DETECTED NOT DETECTED Final   Influenza A NOT DETECTED NOT DETECTED Final   Influenza B NOT DETECTED NOT DETECTED Final   Parainfluenza Virus 1 NOT DETECTED NOT DETECTED Final   Parainfluenza Virus 2 NOT DETECTED NOT DETECTED Final   Parainfluenza Virus 3 NOT DETECTED NOT DETECTED Final   Parainfluenza Virus 4 NOT DETECTED NOT DETECTED Final   Respiratory  Syncytial Virus NOT DETECTED NOT DETECTED Final   Bordetella pertussis NOT DETECTED NOT DETECTED Final   Bordetella Parapertussis NOT DETECTED NOT DETECTED Final   Chlamydophila pneumoniae NOT DETECTED NOT DETECTED Final   Mycoplasma pneumoniae NOT DETECTED NOT DETECTED Final    Comment: Performed at [facility], [place] [address] [place], [place] [address]     Time coordinating discharge: 35 minutes  SIGNED: Antonieta Pert, MD  [facility] 04/13/2022, 9:58 AM  If 7PM-7AM, please contact night-coverage [email or web address]

## 2022-04-13 NOTE — TOC Transition Note (Signed)
Transition of Care [facility]) - CM/SW Discharge Note   Patient Details  Name: [name] MRN: [id number] Date of Birth: Jan 31, 1932  Transition of Care [facility]) CM/SW Contact:  Vassie Moselle, LCSW Phone Number: 04/13/2022, 12:50 PM   Clinical Narrative:    Pt is to transfer to [facility] inpatient hospice. RN to call report to [phone]. PTAR will be arranged once consents for pt's transfer have been signed.    Final next level of care: [place] Barriers to Discharge: Barriers Resolved   Patient Goals and CMS Choice Patient states their goals for this hospitalization and ongoing recovery are:: To return home   Choice offered to / list presented to : Spouse  Discharge Placement                       Discharge Plan and Services In-house Referral: Hospice / Palliative Care Discharge Planning Services: NA Post Acute Care Choice: [place]          DME Arranged: N/A DME Agency: NA                  Social Determinants of Health (SDOH) Interventions     Readmission Risk Interventions    04/11/2022   10:19 AM 11/04/2019    5:00 PM  Readmission Risk Prevention Plan  Post Dischage Appt Complete   Medication Screening Complete   Transportation Screening Complete Complete  PCP or Specialist Appt within 5-7 Days  Complete  Home Care Screening  Complete  Medication Review (RN CM)  Complete

## 2022-04-13 NOTE — Progress Notes (Signed)
Daily Progress Note   Patient Name: [name]       Date: 04/13/2022 DOB: November 21, 1931  Age: 88 y.o. MRN#: [id number] Attending Physician: Antonieta Pert, MD Primary Care Physician: Burnard Bunting, MD Admit Date: 04/06/2022  Reason for Consultation/Follow-up: Establishing goals of care  Patient Profile/HPI: 88 yo woman with dementia, falls at home, hx PE on anticoaglation, multi-level chronic compression fractures and kyphscoliosis, hx of breast cancer and melanoma, CHF, malnutrition and failure to thrive admitted with AMS and abdominal pain. Found to have very large intrathoracic hiatal hernia, probable small bowel perforation with multiple areas of gas a fluid collection in her abdomen and not gelt to be a a good surgical candidate based on comorbidity and frailty. She was started on IV antibiotics and had some minor improvement, but care plan not entirely clear. Palliative consulted for goals of care and symptom management.  Subjective: Patient is awake and alert. Plans are being made for discharge to inpatient hospice today.  Symptoms are well controlled.        Vital Signs: BP 139/72 (BP Location: Left Arm)   Pulse 61   Temp 98.8 F (37.1 C) (Oral)   Resp 16   Ht '5\' 6"'$  (1.676 m)   Wt 54 kg   SpO2 94%   BMI 19.21 kg/m  SpO2: SpO2: 94 % O2 Device: O2 Device: Room Air O2 Flow Rate:    Intake/output summary:  Intake/Output Summary (Last 24 hours) at 04/13/2022 1508 Last data filed at 04/13/2022 1300 Gross per 24 hour  Intake 560.11 ml  Output 650 ml  Net -89.89 ml   LBM: Last BM Date : 04/13/22 Baseline Weight: Weight: 54 kg Most recent weight: Weight: 54 kg       Palliative Assessment/Data: PPS: 30%      Patient Active Problem List   Diagnosis Date Noted    Malnutrition of moderate degree 04/11/2022   Preoperative cardiovascular examination    CAP (community acquired pneumonia) 04/06/2022   Sepsis ([place]) 04/06/2022   Acute encephalopathy 04/06/2022   AV block, [place] 1 67/54/4920   Chronic systolic CHF (congestive heart failure) ([place]) 09/15/2020   History of pulmonary embolism 09/15/2020   History of DVT (deep vein thrombosis) 09/15/2020   Major depression in remission ([place]) 09/15/2020   Coronary artery disease involving native coronary artery  of native heart without angina pectoris 09/15/2020   Acute urinary retention 09/15/2020   Displaced intertrochanteric fracture of left femur, initial encounter for closed fracture ([place]) 09/02/2020   Non-ST elevation (NSTEMI) myocardial infarction [facility])    Closed fracture of distal end of left femur, initial encounter ([place]) 11/01/2019   Other fracture of left femur, initial encounter for closed fracture ([place]) 11/01/2019   Hip fracture ([place]) 11/01/2019   Postprocedural hypotension    NSVT (nonsustained ventricular tachycardia) ([place]) 09/26/2018   Bradycardia    Displaced supracondylar fracture of distal end of right femur without intracondylar extension ([place]) 05/04/2018   Postop check    Pain    Femur fracture, right ([place]) 05/02/2018   Prolonged QT interval 05/02/2018   Bigeminy 05/02/2018   Left ventricular diastolic dysfunction with preserved systolic function 53/74/8270   PAT (paroxysmal atrial tachycardia) ([place]) 78/67/5449   Diastolic dysfunction without heart failure 03/24/2016   Peripheral venous insufficiency 03/24/2016   Closed wedge compression fracture of fourth lumbar vertebra ([place])    Near syncope 10/20/2014   Orthostatic hypotension 10/20/2014   Fatigue 10/19/2014   Syncope 10/19/2014   Generalized weakness    Elevated troponin    Dyspnea on exertion    Rib fracture 09/05/2014   HX: breast cancer 09/26/2013   Depression 11/20/2012   Drug-induced constipation 11/20/2012   Pure  hypercholesterolemia 11/20/2012   Essential hypertension, benign 11/20/2012   Postoperative anemia due to acute blood loss 11/01/2012   Hyponatremia 11/01/2012   Hypokalemia 11/01/2012   OA (osteoarthritis) of knee 10/29/2012   Septic joint of right knee joint ([place]) 05/23/2012   Elevated liver enzymes 05/23/2012    Palliative Care Assessment & Plan    Assessment/Recommendations/Plan  Continue current comfort interventions Plan for discharge to inpatient hospice today   Code Status: DNR  Prognosis:  < 2 weeks  Discharge Planning: Hospice facility  Care plan was discussed with patient and care team.   Thank you for allowing the Palliative Medicine Team to assist in the care of this patient.   Greater than 50%  of this time was spent counseling and coordinating care related to the above assessment and plan.  Mariana Kaufman, AGNP-C Palliative Medicine   Please contact Palliative Medicine Team phone at [phone] for questions and concerns.

## 2022-04-13 NOTE — Plan of Care (Signed)

## 2022-04-13 NOTE — Progress Notes (Signed)
[employer] [facility]) Hospital Liaison Note  Bed offered and accepted today for transfer to [facility]. Unit RN please call report to [phone] prior to patient leaving the unit. Please send signed DNR and paperwork with patient.   Please leave all IV access and ports in place.   [facility] Liaison [phone]

## 2022-05-25 DEATH — deceased
